# Patient Record
Sex: Female | Born: 1945
Health system: Southern US, Community
[De-identification: ages and names within clinical notes are randomized; demographics above are authoritative.]

## PROBLEM LIST (undated history)

## (undated) DIAGNOSIS — Z8719 Personal history of other diseases of the digestive system: Secondary | ICD-10-CM

## (undated) DIAGNOSIS — D649 Anemia, unspecified: Secondary | ICD-10-CM

## (undated) DIAGNOSIS — E7800 Pure hypercholesterolemia, unspecified: Secondary | ICD-10-CM

## (undated) DIAGNOSIS — I509 Heart failure, unspecified: Secondary | ICD-10-CM

## (undated) DIAGNOSIS — K219 Gastro-esophageal reflux disease without esophagitis: Secondary | ICD-10-CM

## (undated) DIAGNOSIS — J40 Bronchitis, not specified as acute or chronic: Secondary | ICD-10-CM

## (undated) DIAGNOSIS — I10 Essential (primary) hypertension: Secondary | ICD-10-CM

## (undated) DIAGNOSIS — F419 Anxiety disorder, unspecified: Secondary | ICD-10-CM

## (undated) DIAGNOSIS — E669 Obesity, unspecified: Secondary | ICD-10-CM

## (undated) DIAGNOSIS — N189 Chronic kidney disease, unspecified: Secondary | ICD-10-CM

## (undated) DIAGNOSIS — F329 Major depressive disorder, single episode, unspecified: Secondary | ICD-10-CM

## (undated) DIAGNOSIS — M1990 Unspecified osteoarthritis, unspecified site: Secondary | ICD-10-CM

## (undated) DIAGNOSIS — F32A Depression, unspecified: Secondary | ICD-10-CM

## (undated) DIAGNOSIS — E039 Hypothyroidism, unspecified: Secondary | ICD-10-CM

## (undated) HISTORY — DX: Obesity, unspecified: E66.9

## (undated) HISTORY — PX: TONSILLECTOMY: SUR1361

## (undated) HISTORY — PX: OTHER SURGICAL HISTORY: SHX169

## (undated) HISTORY — PX: APPENDECTOMY: SHX54

## (undated) HISTORY — PX: REDUCTION MAMMAPLASTY: SUR839

---

## 1979-05-31 HISTORY — PX: ABDOMINAL HYSTERECTOMY: SHX81

## 1997-12-04 ENCOUNTER — Encounter: Admission: RE | Admit: 1997-12-04 | Discharge: 1997-12-04 | Payer: Self-pay | Admitting: Internal Medicine

## 1998-06-25 ENCOUNTER — Encounter: Admission: RE | Admit: 1998-06-25 | Discharge: 1998-06-25 | Payer: Self-pay | Admitting: Hematology and Oncology

## 1998-07-29 ENCOUNTER — Encounter: Admission: RE | Admit: 1998-07-29 | Discharge: 1998-07-29 | Payer: Self-pay | Admitting: Hematology and Oncology

## 1998-08-10 ENCOUNTER — Encounter: Admission: RE | Admit: 1998-08-10 | Discharge: 1998-11-08 | Payer: Self-pay

## 1999-12-08 ENCOUNTER — Encounter: Payer: Self-pay | Admitting: Family Medicine

## 1999-12-08 ENCOUNTER — Encounter: Admission: RE | Admit: 1999-12-08 | Discharge: 1999-12-08 | Payer: Self-pay | Admitting: Family Medicine

## 2000-02-03 ENCOUNTER — Other Ambulatory Visit: Admission: RE | Admit: 2000-02-03 | Discharge: 2000-02-03 | Payer: Self-pay | Admitting: Family Medicine

## 2001-05-07 ENCOUNTER — Encounter: Payer: Self-pay | Admitting: Family Medicine

## 2001-05-07 ENCOUNTER — Encounter: Admission: RE | Admit: 2001-05-07 | Discharge: 2001-05-07 | Payer: Self-pay | Admitting: Family Medicine

## 2001-08-17 ENCOUNTER — Encounter: Admission: RE | Admit: 2001-08-17 | Discharge: 2001-08-17 | Payer: Self-pay | Admitting: Family Medicine

## 2001-08-17 ENCOUNTER — Encounter: Payer: Self-pay | Admitting: Family Medicine

## 2002-09-16 ENCOUNTER — Encounter: Payer: Self-pay | Admitting: Family Medicine

## 2002-09-16 ENCOUNTER — Encounter: Admission: RE | Admit: 2002-09-16 | Discharge: 2002-09-16 | Payer: Self-pay | Admitting: Family Medicine

## 2003-09-22 ENCOUNTER — Encounter: Admission: RE | Admit: 2003-09-22 | Discharge: 2003-09-22 | Payer: Self-pay | Admitting: Family Medicine

## 2004-09-23 ENCOUNTER — Encounter: Admission: RE | Admit: 2004-09-23 | Discharge: 2004-09-23 | Payer: Self-pay | Admitting: Family Medicine

## 2005-09-26 ENCOUNTER — Encounter: Admission: RE | Admit: 2005-09-26 | Discharge: 2005-09-26 | Payer: Self-pay | Admitting: Family Medicine

## 2005-12-13 ENCOUNTER — Other Ambulatory Visit: Admission: RE | Admit: 2005-12-13 | Discharge: 2005-12-13 | Payer: Self-pay | Admitting: Obstetrics and Gynecology

## 2006-09-30 ENCOUNTER — Emergency Department (HOSPITAL_COMMUNITY): Admission: EM | Admit: 2006-09-30 | Discharge: 2006-09-30 | Payer: Self-pay | Admitting: Emergency Medicine

## 2006-11-09 ENCOUNTER — Encounter: Admission: RE | Admit: 2006-11-09 | Discharge: 2006-11-09 | Payer: Self-pay | Admitting: Family Medicine

## 2007-01-09 ENCOUNTER — Emergency Department (HOSPITAL_COMMUNITY): Admission: EM | Admit: 2007-01-09 | Discharge: 2007-01-10 | Payer: Self-pay | Admitting: Emergency Medicine

## 2007-01-10 ENCOUNTER — Ambulatory Visit: Payer: Self-pay | Admitting: Psychiatry

## 2007-01-10 ENCOUNTER — Inpatient Hospital Stay (HOSPITAL_COMMUNITY): Admission: AD | Admit: 2007-01-10 | Discharge: 2007-01-18 | Payer: Self-pay | Admitting: Psychiatry

## 2007-04-06 ENCOUNTER — Encounter: Admission: RE | Admit: 2007-04-06 | Discharge: 2007-04-06 | Payer: Self-pay | Admitting: Family Medicine

## 2007-04-17 ENCOUNTER — Encounter: Admission: RE | Admit: 2007-04-17 | Discharge: 2007-04-17 | Payer: Self-pay | Admitting: Family Medicine

## 2007-04-28 ENCOUNTER — Encounter: Admission: RE | Admit: 2007-04-28 | Discharge: 2007-04-28 | Payer: Self-pay | Admitting: Family Medicine

## 2007-05-11 ENCOUNTER — Ambulatory Visit (HOSPITAL_COMMUNITY): Admission: RE | Admit: 2007-05-11 | Discharge: 2007-05-11 | Payer: Self-pay | Admitting: Family Medicine

## 2007-08-03 ENCOUNTER — Encounter: Admission: RE | Admit: 2007-08-03 | Discharge: 2007-08-03 | Payer: Self-pay | Admitting: Family Medicine

## 2007-08-09 ENCOUNTER — Other Ambulatory Visit: Admission: RE | Admit: 2007-08-09 | Discharge: 2007-08-09 | Payer: Self-pay | Admitting: Interventional Radiology

## 2007-08-09 ENCOUNTER — Encounter (INDEPENDENT_AMBULATORY_CARE_PROVIDER_SITE_OTHER): Payer: Self-pay | Admitting: Interventional Radiology

## 2007-08-09 ENCOUNTER — Encounter: Admission: RE | Admit: 2007-08-09 | Discharge: 2007-08-09 | Payer: Self-pay | Admitting: Family Medicine

## 2007-12-05 ENCOUNTER — Encounter: Admission: RE | Admit: 2007-12-05 | Discharge: 2007-12-05 | Payer: Self-pay | Admitting: Family Medicine

## 2008-01-10 ENCOUNTER — Encounter: Admission: RE | Admit: 2008-01-10 | Discharge: 2008-01-10 | Payer: Self-pay | Admitting: Surgery

## 2008-01-17 ENCOUNTER — Encounter: Admission: RE | Admit: 2008-01-17 | Discharge: 2008-01-17 | Payer: Self-pay | Admitting: Surgery

## 2008-02-22 ENCOUNTER — Encounter (INDEPENDENT_AMBULATORY_CARE_PROVIDER_SITE_OTHER): Payer: Self-pay | Admitting: Surgery

## 2008-02-22 ENCOUNTER — Ambulatory Visit (HOSPITAL_COMMUNITY): Admission: RE | Admit: 2008-02-22 | Discharge: 2008-02-23 | Payer: Self-pay | Admitting: Surgery

## 2008-03-14 ENCOUNTER — Emergency Department (HOSPITAL_COMMUNITY): Admission: EM | Admit: 2008-03-14 | Discharge: 2008-03-14 | Payer: Self-pay | Admitting: Emergency Medicine

## 2008-08-08 ENCOUNTER — Encounter: Admission: RE | Admit: 2008-08-08 | Discharge: 2008-08-08 | Payer: Self-pay | Admitting: Family Medicine

## 2010-03-18 ENCOUNTER — Encounter: Admission: RE | Admit: 2010-03-18 | Discharge: 2010-03-18 | Payer: Self-pay | Admitting: Family Medicine

## 2010-04-20 ENCOUNTER — Encounter: Admission: RE | Admit: 2010-04-20 | Discharge: 2010-04-20 | Payer: Self-pay | Admitting: Family Medicine

## 2010-05-20 ENCOUNTER — Encounter
Admission: RE | Admit: 2010-05-20 | Discharge: 2010-05-20 | Payer: Self-pay | Source: Home / Self Care | Attending: Family Medicine | Admitting: Family Medicine

## 2010-06-20 ENCOUNTER — Encounter: Payer: Self-pay | Admitting: Family Medicine

## 2010-06-21 ENCOUNTER — Encounter: Payer: Self-pay | Admitting: Surgery
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
## 2010-10-12 NOTE — Op Note (Signed)
NAME:  Amanda Cain, Amanda Cain              ACCOUNT NO.:  [id number]   MEDICAL RECORD NO.:  [id number]          PATIENT TYPE:  AMB   LOCATION:  DAY                          FACILITY:  [facility]   PHYSICIAN:  Adin Hector, MD     DATE OF BIRTH:  1945-08-18   DATE OF PROCEDURE:  02/22/2008  DATE OF DISCHARGE:                               OPERATIVE REPORT   PRIMARY CARE PHYSICIAN:  Osvaldo Human, M.D.   SURGEON:  Michael Boston.   ASSISTANT:  Imogene Burn. Tsuei, M.D.   PREOPERATIVE DIAGNOSES:  1. Supraclavicular large neck mass, probable lipoma.  2. Right thyroid nodules x2.  3. Small left thyroid nodules.   POSTOPERATIVE DIAGNOSIS:  1. Supraclavicular large neck mass, probable lipoma.  2. Right thyroid nodules x2.  3. Small left thyroid nodules.   PROCEDURE PERFORMED:  1. Excision of supraclavicular mass with closure over a 10-French      Jackson-Pratt drain.  2. Right thyroid lobectomy and his isthmusectomy.  3. Left neck exploration.   DRAINS:  A 10-French Jackson-Pratt drain rests in the supraclavicular  fossa where the large lipoma was with the tip actually resting in the  right pretracheal region where the former right thyroid lobe was.   ESTIMATED BLOOD LOSS:  50 mL.   COMPLICATIONS:  None apparent.   INDICATIONS:  [name] is a pleasant 65 year old morbidly obese female  who has had an enlarging mass above her right clavicle that has become  uncomfortable for her.  She has also had some thyroid nodules and is  followed by Dr. Roderick Pee.  He requested surgical consultation for both  these issues.  CT scan ultrasound confirmed a probable lipoma in the  neck.  The thyroid nodules have increased in size based on a follow-up  ultrasound and there was a prior biopsy that showed follicular lesion.  Adenoma or carcinoma could not be completely ruled out.   Given the fact that the lesion on the thyroid had enlarged and the neck  mass enlarged and become symptomatic, the decision  was made and  recommended for both.  She does have thyroid nodules in the left lobe.  They seem to be less than a centimeter in size but did note there is a  possibility that if those were suspicious on examination, then she may  require total thyroidectomy.   Technique and procedures were discussed.  Options discussed and  recommendation was made for excision as well.  The risks, benefits and  alternatives were discussed.  Questions were answered.  She agreed to  proceed.   OPERATIVE FINDINGS:  She had a very large lipomatous mass, probably 12 x  12 x 10 cm in size occupying most of the supraclavicular space and  actually adherent to her trapezius muscle posteriorly as well.  It  seemed very fatty, consistent with a lipoma with a pretty good capsule.   The right inferior lobe had a very large mass that seemed for the most  part soft but multilobulated.  There was another firmer nodule.  There  was no evidence of any significant invasion.  Frozen specimen  is  consistent more likely with an adenomatous nodule versus a probable  hyperplastic tissue but suspicion for carcinoma is low.  The isthmus  appeared normal.  The left thyroid lobe was small with no significantly  suspicious masses.   DESCRIPTION OF PROCEDURE:  Informed consent was confirmed.  The patient  underwent general anesthesia without any difficulty.  She voided just  prior to coming to the operating room.  She was placed in the supine  position with both arms tucked with her upper back supported and her  neck gently extended.  Her neck and chest was prepped and draped in a  sterile fashion.   Attention was turned toward the thyroid region to the thyroid lesion,  that was the larger more suspicious lesion.  A curvilinear incision was  made in the neck fold about a fingerbreadth above the clavicles for  about 6 cm and extended to around 7.5 cm given her thick neck.  Cautery  was used to get through the subcutaneous  tissues and platysmas muscle,  with hemostasis excellent.  Subplatysmal flaps were created superiorly  and laterally.  This was freed off the sternocleidomastoids bilaterally  as well for better mobility.  The strap muscles were parted in the  midline and we were able to get behind the sternothyroid, sternohyoid  muscles to get to the thyroid lobe.  Palpation revealed a large nodule  in the right lobe.  The left seemed small with no significant masses.   I went ahead and decided to proceed with thyroid lobectomy.  The thyroid  gland was freed from its attachments to the strap muscles using primary  controlled blunt dissection as well as focused cautery anteriorly and  then transitioning over to the Harmonic scalpel more posteriorly.  Attention was turned to the right superior thyroid lobe.  It was  skeletonized and came up to the lobe rather easily.  The superior lobe  vessels were isolated on their arterial & venous structures and these  were individually clipped and transected using ultrasonic dissection.  We stayed adherent to the superior lobe and were able to free that off.  The medial attachments towards the thyroid and cricocartilage was  carefully freed off using primary blunt dissection.  With that I could  see this was a good candidate for the superior parathyroid gland and it  was preserved and carefully peeled off the thyroid gland with its blood  supply intact.   Attention was turned towards the right inferior lobe.  I carefully  dissected to free that off.  I could see good candidate for the right  inferior parathyroid gland, which seemed more flattened and in a pancake  style and given the mass, I think it pressed it flat.  There was a  little bit of bruising to the gland but it seemed very viable.  I was  able to peel that off.  Further dissection done until I came over to the  middle thyroid region and the vein could be easily isolated and clipped  and transected using  ultrasonic dissection.   Careful meticulous dissection was done to help elevate and roll the  thyroid gland out of the tracheoesophageal groove and free it up until I  was able to come up to the ligament of Berry.  Careful meticulous  dissection was done using primarily ultrasonic dissection to free off  the thyroid gland  to its attachment onto the trachea.  The laryngeal  nerve on the right side could be  seen and it was preserved and kept  away.  No cautery was used in the region.  With further mobilization, I  was able to get behind the isthmus.  The isthmus was very thin but I was  able to free it over towards the left side of the lobe, which was much  smaller, and transected it using ultrasonic dissection.   The specimen was sent off for frozen section.  Dr. Claudette Laws  evaluated it and felt his suspicion was low for any significant  carcinoma.  I palpated the left thyroid gland and it was small but  smooth, with no significant nodules on that side.  I decided to leave  that in place.  The wound was packed with gauze.  Hemostasis was  assured.  A small oozer near the ligament of Gwenlyn Found was elevated and  clipped.  Further packing was done and Surgicel was packed into the  wound.   Attention was turned towards the supraclavicular mass.  A curvilinear 5-  cm incision was made in the supraclavicular fossa following the natural  skin lines, extending it to about 6 cm.  I got through the subcutaneous  tissues and immediately encountered a large fatty mass.  Careful blunt  and focused cautery dissection was used to get circumferentially around  the mass.  It came away down deeply and I was able to reflect it from  the inferior and it did not have any inferior attachments and actually  seemed to have more attachments more towards the medial neck and  posteriorly.  We freed it out about 80 or 90% of it intact but there  were some more dense adhesions on the posterior neck near the  trapezius;  therefore, we reduced the larger mass and sent that off and focused on  the smaller mass posteriorly.  The fat globules was carefully clamped  with an Allis clamp and meticulous dissection was done to free it off  the muscle bundles that it was densely adherent to posteriorly.  Ultrasonic dissection was used on the mild vascular bundle that seemed  to be going to the fatty mass.  This was sent off.  Some of the sac  which the lipoma was in had torn, so I went back and freed that off as  well.   Copious irrigation was done.  Hemostasis was good in the fossa.  I went  up and put some Surgicel at the base near where some of the more  significant feeder vessels were.  I ended up placing a drain as noted  above through a lateral supraclavicular stab wound and tunneled the JP  into the supraclavicular wound and with the tip down in the right  tracheoesophageal groove after tunneling underneath the  sternocleidomastoid sternohyoid muscles under direct visualization and  palpation.   Attention was turned back towards the right neck wound.  Hemostasis was  good.  I went up and closed  the wound by reapproximating the strap  muscles in the midline using interrupted 3-0 Vicryl stitch.  The  platysma flaps were brought back together transversely using 3-0 Vicryl  interrupted stitches as well.  The skin was closed using 4-0 Monocryl  stitch.   The right supraclavicular wound was reinspected and hemostasis was  excellent.  The wound was closed using 3-0 Vicryl deep dermal stitches  and right 4-0 subcuticular Monocryl stitches.  Full and partial length  Steri-Strips were placed.  The drain was secured to the skin using 2-0  silk stitch and tightened down with a 4-0 Monocryl stitch as well.  A  sterile dressing was applied.   The patient was extubated and taken to the recovery room in stable  condition.   The surgery was performed in [facility] operating room 11.      Adin Hector, MD  Electronically Signed     SCG/MEDQ  D:  02/22/2008  T:  02/23/2008  Job:  [id number]   cc:   Osvaldo Human, M.D.  Fax: [phone]
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
## 2011-02-28 LAB — HEMOGLOBIN AND HEMATOCRIT, BLOOD
HCT: 35.6 — ABNORMAL LOW
Hemoglobin: 11.7 — ABNORMAL LOW

## 2011-02-28 LAB — BASIC METABOLIC PANEL
BUN: 15
CO2: 30
Calcium: 9.7
Chloride: 100
Creatinine, Ser: 0.85
GFR calc Af Amer: >60
GFR calc non Af Amer: >60
Glucose, Bld: 110 — ABNORMAL HIGH
Potassium: 3.5
Sodium: 137

## 2011-02-28 LAB — GLUCOSE, CAPILLARY
Glucose-Capillary: 114 — ABNORMAL HIGH
Glucose-Capillary: 169 — ABNORMAL HIGH
Glucose-Capillary: 207 — ABNORMAL HIGH
Glucose-Capillary: 135 — ABNORMAL HIGH

## 2011-03-11 LAB — HEPATIC FUNCTION PANEL
ALT: 27
AST: 16
Albumin: 3.3 — ABNORMAL LOW
Alkaline Phosphatase: 68
Bilirubin, Direct: 0.1
Indirect Bilirubin: 0.6
Total Bilirubin: 0.7
Total Protein: 6.6

## 2011-03-11 LAB — TSH: TSH: 0.799

## 2011-03-11 LAB — MAGNESIUM: Magnesium: 2.2

## 2011-03-14 LAB — RAPID URINE DRUG SCREEN, HOSP PERFORMED
Amphetamines: NOT DETECTED
Barbiturates: NOT DETECTED
Benzodiazepines: POSITIVE — AB
Cocaine: NOT DETECTED
Opiates: NOT DETECTED
Tetrahydrocannabinol: NOT DETECTED

## 2011-03-14 LAB — BASIC METABOLIC PANEL
BUN: 21
CO2: 26
Calcium: 9.3
Chloride: 100
Creatinine, Ser: 1.16
GFR calc Af Amer: 57 — ABNORMAL LOW
GFR calc non Af Amer: 47 — ABNORMAL LOW
Glucose, Bld: 140 — ABNORMAL HIGH
Potassium: 3.8
Sodium: 135

## 2011-03-14 LAB — CBC
HCT: 35.5 — ABNORMAL LOW
Hemoglobin: 11.8 — ABNORMAL LOW
MCHC: 33.3
MCV: 81
Platelets: 378
RBC: 4.38
RDW: 18.5 — ABNORMAL HIGH
WBC: 7.8

## 2011-03-14 LAB — URINALYSIS, ROUTINE W REFLEX MICROSCOPIC
Bilirubin Urine: NEGATIVE
Glucose, UA: NEGATIVE
Hgb urine dipstick: NEGATIVE
Ketones, ur: NEGATIVE
Nitrite: NEGATIVE
Protein, ur: NEGATIVE
Specific Gravity, Urine: 1.014
Urobilinogen, UA: 0.2
pH: 6.5

## 2011-03-14 LAB — DIFFERENTIAL
Basophils Absolute: 0
Basophils Relative: 0
Eosinophils Absolute: 0.2
Eosinophils Relative: 3
Lymphocytes Relative: 24
Lymphs Abs: 1.8
Monocytes Absolute: 0.6
Monocytes Relative: 8
Neutro Abs: 5.1
Neutrophils Relative %: 66

## 2011-03-14 LAB — URINE MICROSCOPIC-ADD ON

## 2011-03-14 LAB — ETHANOL: Alcohol, Ethyl (B): <5

## 2011-05-13 ENCOUNTER — Other Ambulatory Visit: Payer: Self-pay | Admitting: Family Medicine

## 2011-05-13 DIAGNOSIS — Z1231 Encounter for screening mammogram for malignant neoplasm of breast: Secondary | ICD-10-CM

## 2011-06-17 ENCOUNTER — Ambulatory Visit: Payer: Self-pay

## 2011-06-30 ENCOUNTER — Encounter (HOSPITAL_COMMUNITY): Payer: Self-pay | Admitting: Pharmacy Technician

## 2011-07-01 ENCOUNTER — Other Ambulatory Visit: Payer: Self-pay | Admitting: Orthopedic Surgery

## 2011-07-12 ENCOUNTER — Encounter (HOSPITAL_COMMUNITY): Payer: Self-pay

## 2011-07-12 ENCOUNTER — Encounter (HOSPITAL_COMMUNITY)
Admission: RE | Admit: 2011-07-12 | Discharge: 2011-07-12 | Disposition: A | Discharge status: Home / Self Care | Payer: Medicare Other | Source: Ambulatory Visit | Attending: Orthopedic Surgery | Admitting: Orthopedic Surgery

## 2011-07-12 HISTORY — DX: Major depressive disorder, single episode, unspecified: F32.9

## 2011-07-12 HISTORY — DX: Anemia, unspecified: D64.9

## 2011-07-12 HISTORY — DX: Hypothyroidism, unspecified: E03.9

## 2011-07-12 HISTORY — DX: Anxiety disorder, unspecified: F41.9

## 2011-07-12 HISTORY — DX: Depression, unspecified: F32.A

## 2011-07-12 HISTORY — DX: Gastro-esophageal reflux disease without esophagitis: K21.9

## 2011-07-12 LAB — CBC
HCT: 32.9 % — ABNORMAL LOW (ref 36.0–46.0)
Hemoglobin: 10.4 g/dL — ABNORMAL LOW (ref 12.0–15.0)
MCH: 26 pg (ref 26.0–34.0)
MCHC: 31.6 g/dL (ref 30.0–36.0)
MCV: 82.3 fL (ref 78.0–100.0)
Platelets: 400 10*3/uL (ref 150–400)
RBC: 4 MIL/uL (ref 3.87–5.11)
RDW: 17.2 % — ABNORMAL HIGH (ref 11.5–15.5)
WBC: 9.9 10*3/uL (ref 4.0–10.5)

## 2011-07-12 LAB — URINALYSIS, ROUTINE W REFLEX MICROSCOPIC
Bilirubin Urine: NEGATIVE
Glucose, UA: NEGATIVE mg/dL
Hgb urine dipstick: NEGATIVE
Ketones, ur: NEGATIVE mg/dL
Leukocytes, UA: NEGATIVE
Nitrite: NEGATIVE
Protein, ur: NEGATIVE mg/dL
Specific Gravity, Urine: 1.006 (ref 1.005–1.030)
Urobilinogen, UA: 0.2 mg/dL (ref 0.0–1.0)
pH: 7.5 (ref 5.0–8.0)

## 2011-07-12 LAB — COMPREHENSIVE METABOLIC PANEL
ALT: 15 U/L (ref 0–35)
AST: 13 U/L (ref 0–37)
Albumin: 3.5 g/dL (ref 3.5–5.2)
Alkaline Phosphatase: 65 U/L (ref 39–117)
BUN: 12 mg/dL (ref 6–23)
CO2: 28 mEq/L (ref 19–32)
Calcium: 9.5 mg/dL (ref 8.4–10.5)
Chloride: 99 mEq/L (ref 96–112)
Creatinine, Ser: 0.93 mg/dL (ref 0.50–1.10)
GFR calc Af Amer: 72 mL/min — ABNORMAL LOW (ref >90)
GFR calc non Af Amer: 62 mL/min — ABNORMAL LOW (ref >90)
Glucose, Bld: 109 mg/dL — ABNORMAL HIGH (ref 70–99)
Potassium: 3.7 mEq/L (ref 3.5–5.1)
Sodium: 136 mEq/L (ref 135–145)
Total Bilirubin: 0.3 mg/dL (ref 0.3–1.2)
Total Protein: 7.5 g/dL (ref 6.0–8.3)

## 2011-07-12 LAB — SURGICAL PCR SCREEN
MRSA, PCR: NEGATIVE
Staphylococcus aureus: NEGATIVE

## 2011-07-12 LAB — DIFFERENTIAL
Basophils Absolute: 0 10*3/uL (ref 0.0–0.1)
Basophils Relative: 0 % (ref 0–1)
Eosinophils Absolute: 0.1 10*3/uL (ref 0.0–0.7)
Eosinophils Relative: 1 % (ref 0–5)
Lymphocytes Relative: 10 % — ABNORMAL LOW (ref 12–46)
Lymphs Abs: 1 10*3/uL (ref 0.7–4.0)
Monocytes Absolute: 0.7 10*3/uL (ref 0.1–1.0)
Monocytes Relative: 7 % (ref 3–12)
Neutro Abs: 8 10*3/uL — ABNORMAL HIGH (ref 1.7–7.7)
Neutrophils Relative %: 81 % — ABNORMAL HIGH (ref 43–77)

## 2011-07-12 LAB — PROTIME-INR
INR: 1.02 (ref 0.00–1.49)
Prothrombin Time: 13.6 seconds (ref 11.6–15.2)

## 2011-07-12 LAB — APTT: aPTT: 35 seconds (ref 24–37)

## 2011-07-12 NOTE — Pre-Procedure Instructions (Signed)
Chest xray 03-17-2011 [facility] on chart e[facility] 04-29-11 [facility] on chart

## 2011-07-12 NOTE — Patient Instructions (Addendum)
20 ADELEI MALTBIE  07/12/2011   Your procedure is scheduled on:   Report to [facility] at  [place] AM.  Call this number if you have problems the morning of surgery: [phone]   Remember:   Do not eat food or drink liquids:After Midnight.  Take these medicines the morning of surgery with A SIP OF WATER: levothryoid, ativan, toprol, plendil   Do not wear jewelry...  Do not wear lotions, powders, or perfumes. Do not wear deodorant.  .  Do not bring valuables to the hospital.  Contacts, dentures or bridgework may not be worn into surgery.  Leave suitcase in the car. After surgery it may be brought to your room.  For patients admitted to the hospital, checkout time is 11:00 AM the day of discharge.     Special Instructions: CHG Shower Use Special Wash: 1/2 bottle night before surgery and 1/2 bottle morning of surgery.neck down avoid private area, do not shave for 2 days before showers   Please read over the following fact sheets that you were given: MRSA Information  Ivin Booty Quadir Muns rn [facility] pre op nurse phone number [phone] call if needed

## 2011-07-15 ENCOUNTER — Encounter (HOSPITAL_COMMUNITY)
Admission: RE | Disposition: A | Discharge status: Skilled Nursing Facility | Payer: Self-pay | Source: Ambulatory Visit | Attending: Orthopedic Surgery

## 2011-07-15 ENCOUNTER — Encounter (HOSPITAL_COMMUNITY): Payer: Self-pay | Admitting: *Deleted

## 2011-07-15 ENCOUNTER — Inpatient Hospital Stay (HOSPITAL_COMMUNITY): Payer: Medicare Other | Admitting: *Deleted

## 2011-07-15 ENCOUNTER — Inpatient Hospital Stay (HOSPITAL_COMMUNITY)
Admission: RE | Admit: 2011-07-15 | Discharge: 2011-07-19 | DRG: 470 | Disposition: A | Discharge status: Skilled Nursing Facility | Payer: Medicare Other | Source: Ambulatory Visit | Attending: Orthopedic Surgery | Admitting: Orthopedic Surgery

## 2011-07-15 DIAGNOSIS — D649 Anemia, unspecified: Secondary | ICD-10-CM | POA: Diagnosis present

## 2011-07-15 DIAGNOSIS — Z01812 Encounter for preprocedural laboratory examination: Secondary | ICD-10-CM

## 2011-07-15 DIAGNOSIS — E119 Type 2 diabetes mellitus without complications: Secondary | ICD-10-CM | POA: Diagnosis present

## 2011-07-15 DIAGNOSIS — M1712 Unilateral primary osteoarthritis, left knee: Secondary | ICD-10-CM | POA: Diagnosis present

## 2011-07-15 DIAGNOSIS — E039 Hypothyroidism, unspecified: Secondary | ICD-10-CM | POA: Diagnosis present

## 2011-07-15 DIAGNOSIS — M1710 Unilateral primary osteoarthritis, unspecified knee: Principal | ICD-10-CM | POA: Diagnosis present

## 2011-07-15 DIAGNOSIS — E669 Obesity, unspecified: Secondary | ICD-10-CM | POA: Diagnosis present

## 2011-07-15 HISTORY — PX: KNEE ARTHROPLASTY: SHX992

## 2011-07-15 LAB — GLUCOSE, CAPILLARY
Glucose-Capillary: 142 mg/dL — ABNORMAL HIGH (ref 70–99)
Glucose-Capillary: 176 mg/dL — ABNORMAL HIGH (ref 70–99)
Glucose-Capillary: 163 mg/dL — ABNORMAL HIGH (ref 70–99)
Glucose-Capillary: 160 mg/dL — ABNORMAL HIGH (ref 70–99)
Glucose-Capillary: 117 mg/dL — ABNORMAL HIGH (ref 70–99)

## 2011-07-15 SURGERY — ARTHROPLASTY, KNEE, TOTAL, USING IMAGELESS COMPUTER-ASSISTED NAVIGATION
Anesthesia: General | Site: Knee | Laterality: Left | Wound class: Clean

## 2011-07-15 MED ORDER — VITAMINS A & D EX OINT
TOPICAL_OINTMENT | CUTANEOUS | Status: AC
  Administered 2011-07-15: 15:00:00
  Filled 2011-07-15: qty 5

## 2011-07-15 MED ORDER — POVIDONE-IODINE 7.5 % EX SOLN: Freq: Once | CUTANEOUS | Status: DC

## 2011-07-15 MED ORDER — DOCUSATE SODIUM 100 MG PO CAPS
100.0000 mg | ORAL_CAPSULE | Freq: Two times a day (BID) | ORAL | Status: DC
  Administered 2011-07-15 – 2011-07-19 (×8): 100 mg via ORAL
  Filled 2011-07-15 (×9): qty 1

## 2011-07-15 MED ORDER — ACETAMINOPHEN 10 MG/ML IV SOLN
1000.0000 mg | Freq: Four times a day (QID) | INTRAVENOUS | Status: AC
  Administered 2011-07-15 – 2011-07-16 (×3): 1000 mg via INTRAVENOUS
  Filled 2011-07-15 (×3): qty 100

## 2011-07-15 MED ORDER — SODIUM CHLORIDE 0.9 % IJ SOLN: 9.0000 mL | INTRAMUSCULAR | Status: DC | PRN

## 2011-07-15 MED ORDER — FELODIPINE ER 5 MG PO TB24
5.0000 mg | ORAL_TABLET | Freq: Every morning | ORAL | Status: DC
  Administered 2011-07-16 – 2011-07-19 (×4): 5 mg via ORAL
  Filled 2011-07-15 (×4): qty 1

## 2011-07-15 MED ORDER — CISATRACURIUM BESYLATE 2 MG/ML IV SOLN
INTRAVENOUS | Status: DC | PRN
  Administered 2011-07-15: 2 mg via INTRAVENOUS
  Administered 2011-07-15: 8 mg via INTRAVENOUS

## 2011-07-15 MED ORDER — ESCITALOPRAM OXALATE 20 MG PO TABS
20.0000 mg | ORAL_TABLET | Freq: Every day | ORAL | Status: DC
  Administered 2011-07-15 – 2011-07-18 (×4): 20 mg via ORAL
  Filled 2011-07-15 (×5): qty 1

## 2011-07-15 MED ORDER — DIPHENHYDRAMINE HCL 50 MG/ML IJ SOLN: 12.5000 mg | Freq: Four times a day (QID) | INTRAMUSCULAR | Status: DC | PRN

## 2011-07-15 MED ORDER — BISACODYL 5 MG PO TBEC
5.0000 mg | DELAYED_RELEASE_TABLET | Freq: Every day | ORAL | Status: DC | PRN
  Administered 2011-07-17: 5 mg via ORAL
  Filled 2011-07-15: qty 1

## 2011-07-15 MED ORDER — DROPERIDOL 2.5 MG/ML IJ SOLN
INTRAMUSCULAR | Status: DC | PRN
  Administered 2011-07-15: 0.625 mg via INTRAVENOUS

## 2011-07-15 MED ORDER — CEFAZOLIN SODIUM-DEXTROSE 2-3 GM-% IV SOLR: 2.0000 g | INTRAVENOUS | Status: DC

## 2011-07-15 MED ORDER — ACETAMINOPHEN 10 MG/ML IV SOLN
INTRAVENOUS | Status: DC | PRN
  Administered 2011-07-15: 1000 mg via INTRAVENOUS

## 2011-07-15 MED ORDER — LEVOTHYROXINE SODIUM 100 MCG PO TABS
100.0000 ug | ORAL_TABLET | Freq: Every day | ORAL | Status: DC
  Administered 2011-07-16 – 2011-07-19 (×4): 100 ug via ORAL
  Filled 2011-07-15 (×4): qty 1

## 2011-07-15 MED ORDER — OLMESARTAN MEDOXOMIL-HCTZ 40-25 MG PO TABS: 1.0000 | ORAL_TABLET | Freq: Every day | ORAL | Status: DC

## 2011-07-15 MED ORDER — INSULIN ASPART 100 UNIT/ML ~~LOC~~ SOLN
0.0000 [IU] | Freq: Three times a day (TID) | SUBCUTANEOUS | Status: DC
  Administered 2011-07-15: 3 [IU] via SUBCUTANEOUS
  Administered 2011-07-16: 2 [IU] via SUBCUTANEOUS
  Administered 2011-07-16: 3 [IU] via SUBCUTANEOUS
  Administered 2011-07-16: 2 [IU] via SUBCUTANEOUS
  Administered 2011-07-17 (×2): 3 [IU] via SUBCUTANEOUS
  Administered 2011-07-17 – 2011-07-18 (×2): 2 [IU] via SUBCUTANEOUS
  Administered 2011-07-18: 3 [IU] via SUBCUTANEOUS
  Administered 2011-07-18: 2 [IU] via SUBCUTANEOUS
  Administered 2011-07-19: 3 [IU] via SUBCUTANEOUS
  Filled 2011-07-15: qty 3

## 2011-07-15 MED ORDER — COUMADIN BOOK
Freq: Once | Status: AC
  Administered 2011-07-15: 14:00:00
  Filled 2011-07-15: qty 1

## 2011-07-15 MED ORDER — ACETAMINOPHEN 325 MG PO TABS: 650.0000 mg | ORAL_TABLET | Freq: Four times a day (QID) | ORAL | Status: DC | PRN

## 2011-07-15 MED ORDER — PANTOPRAZOLE SODIUM 40 MG PO TBEC
40.0000 mg | DELAYED_RELEASE_TABLET | Freq: Every day | ORAL | Status: DC
  Administered 2011-07-16 – 2011-07-18 (×3): 40 mg via ORAL
  Filled 2011-07-15 (×4): qty 1

## 2011-07-15 MED ORDER — NEOSTIGMINE METHYLSULFATE 1 MG/ML IJ SOLN
INTRAMUSCULAR | Status: DC | PRN
  Administered 2011-07-15: 1 mg via INTRAVENOUS

## 2011-07-15 MED ORDER — LACTATED RINGERS IV SOLN
INTRAVENOUS | Status: DC | PRN
  Administered 2011-07-15 (×3): via INTRAVENOUS

## 2011-07-15 MED ORDER — VITAMIN B-12 1000 MCG PO TABS
1000.0000 ug | ORAL_TABLET | Freq: Every day | ORAL | Status: DC
  Administered 2011-07-16 – 2011-07-19 (×4): 1000 ug via ORAL
  Filled 2011-07-15 (×4): qty 1

## 2011-07-15 MED ORDER — ONDANSETRON HCL 4 MG/2ML IJ SOLN: 4.0000 mg | Freq: Four times a day (QID) | INTRAMUSCULAR | Status: DC | PRN

## 2011-07-15 MED ORDER — KETOROLAC TROMETHAMINE 30 MG/ML IJ SOLN
15.0000 mg | Freq: Three times a day (TID) | INTRAMUSCULAR | Status: AC
  Administered 2011-07-15 (×3): 15 mg via INTRAVENOUS
  Filled 2011-07-15 (×2): qty 1

## 2011-07-15 MED ORDER — ONDANSETRON HCL 4 MG/2ML IJ SOLN
4.0000 mg | Freq: Four times a day (QID) | INTRAMUSCULAR | Status: DC | PRN
  Administered 2011-07-17: 4 mg via INTRAVENOUS
  Filled 2011-07-15 (×2): qty 2

## 2011-07-15 MED ORDER — ALUM & MAG HYDROXIDE-SIMETH 200-200-20 MG/5ML PO SUSP
30.0000 mL | ORAL | Status: DC | PRN
  Administered 2011-07-15 – 2011-07-16 (×2): 30 mL via ORAL
  Filled 2011-07-15 (×2): qty 30

## 2011-07-15 MED ORDER — PROMETHAZINE HCL 25 MG/ML IJ SOLN: 6.2500 mg | INTRAMUSCULAR | Status: DC | PRN

## 2011-07-15 MED ORDER — OLMESARTAN MEDOXOMIL 40 MG PO TABS
40.0000 mg | ORAL_TABLET | Freq: Every day | ORAL | Status: DC
  Administered 2011-07-16 – 2011-07-19 (×4): 40 mg via ORAL
  Filled 2011-07-15 (×4): qty 1

## 2011-07-15 MED ORDER — HYDROMORPHONE HCL PF 1 MG/ML IJ SOLN: 0.2500 mg | INTRAMUSCULAR | Status: DC | PRN

## 2011-07-15 MED ORDER — ONDANSETRON HCL 4 MG/2ML IJ SOLN
INTRAMUSCULAR | Status: DC | PRN
  Administered 2011-07-15 (×2): 2 mg via INTRAVENOUS

## 2011-07-15 MED ORDER — ACETAMINOPHEN 650 MG RE SUPP: 650.0000 mg | Freq: Four times a day (QID) | RECTAL | Status: DC | PRN

## 2011-07-15 MED ORDER — LABETALOL HCL 5 MG/ML IV SOLN
INTRAVENOUS | Status: DC | PRN
  Administered 2011-07-15: 2.5 mg via INTRAVENOUS

## 2011-07-15 MED ORDER — ROSUVASTATIN CALCIUM 20 MG PO TABS
20.0000 mg | ORAL_TABLET | Freq: Every day | ORAL | Status: DC
  Administered 2011-07-16 – 2011-07-19 (×4): 20 mg via ORAL
  Filled 2011-07-15 (×4): qty 1

## 2011-07-15 MED ORDER — METHOCARBAMOL 100 MG/ML IJ SOLN
500.0000 mg | Freq: Four times a day (QID) | INTRAVENOUS | Status: DC | PRN
  Administered 2011-07-15: 500 mg via INTRAVENOUS
  Filled 2011-07-15 (×2): qty 5

## 2011-07-15 MED ORDER — ESTROGENS CONJUGATED 0.45 MG PO TABS
0.4500 mg | ORAL_TABLET | Freq: Every day | ORAL | Status: DC
  Administered 2011-07-18 – 2011-07-19 (×2): 0.45 mg via ORAL
  Filled 2011-07-15 (×4): qty 1

## 2011-07-15 MED ORDER — MORPHINE SULFATE (PF) 1 MG/ML IV SOLN
INTRAVENOUS | Status: DC
  Administered 2011-07-15: 14:00:00 via INTRAVENOUS
  Administered 2011-07-15 – 2011-07-16 (×2): 4 mg via INTRAVENOUS
  Administered 2011-07-16: 4.5 mg via INTRAVENOUS
  Administered 2011-07-16 (×2): 2 mg via INTRAVENOUS
  Administered 2011-07-16: 23:00:00 via INTRAVENOUS
  Administered 2011-07-16: 2 mg via INTRAVENOUS
  Administered 2011-07-17: 1.5 mg via INTRAVENOUS
  Administered 2011-07-17: 3 mg via INTRAVENOUS
  Filled 2011-07-15 (×2): qty 25

## 2011-07-15 MED ORDER — SUCCINYLCHOLINE CHLORIDE 20 MG/ML IJ SOLN
INTRAMUSCULAR | Status: DC | PRN
  Administered 2011-07-15: 120 mg via INTRAVENOUS

## 2011-07-15 MED ORDER — WARFARIN SODIUM 7.5 MG PO TABS
7.5000 mg | ORAL_TABLET | Freq: Once | ORAL | Status: AC
  Administered 2011-07-15: 7.5 mg via ORAL
  Filled 2011-07-15: qty 1

## 2011-07-15 MED ORDER — LORAZEPAM 0.5 MG PO TABS
0.5000 mg | ORAL_TABLET | Freq: Two times a day (BID) | ORAL | Status: DC
  Administered 2011-07-15 – 2011-07-19 (×8): 0.5 mg via ORAL
  Filled 2011-07-15 (×8): qty 1

## 2011-07-15 MED ORDER — HYDROCHLOROTHIAZIDE 25 MG PO TABS
25.0000 mg | ORAL_TABLET | Freq: Every day | ORAL | Status: DC
  Administered 2011-07-16 – 2011-07-19 (×4): 25 mg via ORAL
  Filled 2011-07-15 (×4): qty 1

## 2011-07-15 MED ORDER — METOPROLOL SUCCINATE ER 100 MG PO TB24
200.0000 mg | ORAL_TABLET | Freq: Every day | ORAL | Status: DC
  Administered 2011-07-16 – 2011-07-19 (×4): 200 mg via ORAL
  Filled 2011-07-15 (×4): qty 2

## 2011-07-15 MED ORDER — PROPOFOL 10 MG/ML IV EMUL
INTRAVENOUS | Status: DC | PRN
  Administered 2011-07-15: 200 mg via INTRAVENOUS
  Administered 2011-07-15: 25 mg via INTRAVENOUS
  Administered 2011-07-15: 50 mg via INTRAVENOUS

## 2011-07-15 MED ORDER — FENTANYL CITRATE 0.05 MG/ML IJ SOLN
INTRAMUSCULAR | Status: DC | PRN
  Administered 2011-07-15: 100 ug via INTRAVENOUS
  Administered 2011-07-15 (×2): 50 ug via INTRAVENOUS
  Administered 2011-07-15: 100 ug via INTRAVENOUS
  Administered 2011-07-15 (×2): 50 ug via INTRAVENOUS

## 2011-07-15 MED ORDER — CEFAZOLIN SODIUM 1-5 GM-% IV SOLN
INTRAVENOUS | Status: DC | PRN
  Administered 2011-07-15: 2 g via INTRAVENOUS

## 2011-07-15 MED ORDER — ZOLPIDEM TARTRATE 5 MG PO TABS: 5.0000 mg | ORAL_TABLET | Freq: Every evening | ORAL | Status: DC | PRN

## 2011-07-15 MED ORDER — SODIUM CHLORIDE 0.9 % IR SOLN
Status: DC | PRN
  Administered 2011-07-15: 3000 mL

## 2011-07-15 MED ORDER — SODIUM CHLORIDE 0.9 % IV SOLN
INTRAVENOUS | Status: DC
  Administered 2011-07-15 – 2011-07-17 (×3): via INTRAVENOUS

## 2011-07-15 MED ORDER — METFORMIN HCL 500 MG PO TABS
1000.0000 mg | ORAL_TABLET | Freq: Two times a day (BID) | ORAL | Status: DC
  Administered 2011-07-15 – 2011-07-19 (×8): 1000 mg via ORAL
  Filled 2011-07-15 (×9): qty 2

## 2011-07-15 MED ORDER — MIDAZOLAM HCL 5 MG/5ML IJ SOLN
INTRAMUSCULAR | Status: DC | PRN
  Administered 2011-07-15 (×2): 1 mg via INTRAVENOUS

## 2011-07-15 MED ORDER — CEFAZOLIN SODIUM-DEXTROSE 2-3 GM-% IV SOLR
2.0000 g | Freq: Four times a day (QID) | INTRAVENOUS | Status: AC
  Administered 2011-07-15 – 2011-07-16 (×3): 2 g via INTRAVENOUS
  Filled 2011-07-15 (×3): qty 50

## 2011-07-15 MED ORDER — NALOXONE HCL 0.4 MG/ML IJ SOLN: 0.4000 mg | INTRAMUSCULAR | Status: DC | PRN

## 2011-07-15 MED ORDER — WARFARIN VIDEO: Freq: Once | Status: DC

## 2011-07-15 MED ORDER — LIDOCAINE HCL (CARDIAC) 20 MG/ML IV SOLN
INTRAVENOUS | Status: DC | PRN
  Administered 2011-07-15: 75 mg via INTRAVENOUS

## 2011-07-15 MED ORDER — ONDANSETRON HCL 4 MG PO TABS: 4.0000 mg | ORAL_TABLET | Freq: Four times a day (QID) | ORAL | Status: DC | PRN

## 2011-07-15 MED ORDER — PROMETHAZINE HCL 25 MG/ML IJ SOLN: 12.5000 mg | Freq: Four times a day (QID) | INTRAMUSCULAR | Status: DC | PRN

## 2011-07-15 MED ORDER — DIPHENHYDRAMINE HCL 12.5 MG/5ML PO ELIX: 12.5000 mg | ORAL_SOLUTION | Freq: Four times a day (QID) | ORAL | Status: DC | PRN

## 2011-07-15 MED ORDER — GLYCOPYRROLATE 0.2 MG/ML IJ SOLN
INTRAMUSCULAR | Status: DC | PRN
  Administered 2011-07-15: .2 mg via INTRAVENOUS
  Administered 2011-07-15: .4 mg via INTRAVENOUS

## 2011-07-15 MED ORDER — METHOCARBAMOL 500 MG PO TABS
500.0000 mg | ORAL_TABLET | Freq: Four times a day (QID) | ORAL | Status: DC | PRN
  Administered 2011-07-16 – 2011-07-19 (×7): 500 mg via ORAL
  Filled 2011-07-15 (×7): qty 1

## 2011-07-15 MED ORDER — INSULIN ASPART 100 UNIT/ML ~~LOC~~ SOLN
0.0000 [IU] | SUBCUTANEOUS | Status: DC
Start: 2011-07-15 — End: 2011-07-15

## 2011-07-15 MED ORDER — SODIUM CHLORIDE 0.9 % IR SOLN
Status: DC | PRN
  Administered 2011-07-15: 1000 mL

## 2011-07-15 MED ORDER — OXYCODONE HCL 5 MG PO TABS
5.0000 mg | ORAL_TABLET | ORAL | Status: DC | PRN
  Administered 2011-07-16 – 2011-07-19 (×10): 5 mg via ORAL
  Administered 2011-07-19: 10 mg via ORAL
  Filled 2011-07-15: qty 1
  Filled 2011-07-15: qty 2
  Filled 2011-07-15 (×7): qty 1
  Filled 2011-07-15: qty 2
  Filled 2011-07-15: qty 1

## 2011-07-15 SURGICAL SUPPLY — 62 items
BAG SPEC THK2 15X12 ZIP CLS (MISCELLANEOUS) ×1
BAG ZIPLOCK 12X15 (MISCELLANEOUS) ×2 IMPLANT
BANDAGE ACE 4 STERILE (GAUZE/BANDAGES/DRESSINGS) ×1 IMPLANT
BANDAGE ELASTIC 4 VELCRO ST LF (GAUZE/BANDAGES/DRESSINGS) ×2 IMPLANT
BANDAGE ELASTIC 6 VELCRO ST LF (GAUZE/BANDAGES/DRESSINGS) ×2 IMPLANT
BANDAGE ESMARK 6X9 LF (GAUZE/BANDAGES/DRESSINGS) ×1 IMPLANT
BLADE HEX COATED 2.75 (ELECTRODE) ×1 IMPLANT
BLADE SAG 18X100X1.27 (BLADE) ×2 IMPLANT
BLADE SAW SGTL 13.0X1.19X90.0M (BLADE) ×2 IMPLANT
BNDG CMPR 9X6 STRL LF SNTH (GAUZE/BANDAGES/DRESSINGS) ×1
BNDG ESMARK 6X9 LF (GAUZE/BANDAGES/DRESSINGS) ×2
BOOTIES KNEE HIGH SLOAN (MISCELLANEOUS) ×2 IMPLANT
BOWL SMART MIX CTS (DISPOSABLE) ×2 IMPLANT
CEMENT HV SMART SET (Cement) ×3 IMPLANT
CLOTH BEACON ORANGE TIMEOUT ST (SAFETY) ×2 IMPLANT
COVER SURGICAL LIGHT HANDLE (MISCELLANEOUS) ×2 IMPLANT
DRAPE EXTREMITY T 121X128X90 (DRAPE) ×2 IMPLANT
DRAPE POUCH INSTRU U-SHP 10X18 (DRAPES) ×2 IMPLANT
DRAPE U-SHAPE 47X51 STRL (DRAPES) ×2 IMPLANT
DRSG ADAPTIC 3X8 NADH LF (GAUZE/BANDAGES/DRESSINGS) ×2 IMPLANT
DRSG EMULSION OIL 3X16 NADH (GAUZE/BANDAGES/DRESSINGS) ×1 IMPLANT
DRSG PAD ABDOMINAL 8X10 ST (GAUZE/BANDAGES/DRESSINGS) ×2 IMPLANT
DURAPREP 26ML APPLICATOR (WOUND CARE) ×2 IMPLANT
ELECT REM PT RETURN 9FT ADLT (ELECTROSURGICAL) ×2
ELECTRODE REM PT RTRN 9FT ADLT (ELECTROSURGICAL) ×1 IMPLANT
EVACUATOR 1/8 PVC DRAIN (DRAIN) ×2 IMPLANT
FACESHIELD LNG OPTICON STERILE (SAFETY) ×3 IMPLANT
GLOVE BIO SURGEON STRL SZ8 (GLOVE) ×2 IMPLANT
GLOVE BIOGEL PI IND STRL 8 (GLOVE) IMPLANT
GLOVE BIOGEL PI INDICATOR 8 (GLOVE) ×2
GLOVE ECLIPSE 7.5 STRL STRAW (GLOVE) ×2 IMPLANT
GLOVE SURG ORTHO 8.0 STRL STRW (GLOVE) ×1 IMPLANT
GLOVE SURG SS PI 6.5 STRL IVOR (GLOVE) ×2 IMPLANT
GLOVE SURG SS PI 7.5 STRL IVOR (GLOVE) ×2 IMPLANT
GOWN PREVENTION PLUS XLARGE (GOWN DISPOSABLE) ×2 IMPLANT
GOWN STRL REIN XL XLG (GOWN DISPOSABLE) ×3 IMPLANT
HANDPIECE INTERPULSE COAX TIP (DISPOSABLE) ×2
HOOD PEEL AWAY FACE SHEILD DIS (HOOD) ×7 IMPLANT
IMMOBILIZER KNEE 20 (SOFTGOODS) ×2
IMMOBILIZER KNEE 20 THIGH 36 (SOFTGOODS) IMPLANT
KIT BASIN OR (CUSTOM PROCEDURE TRAY) ×2 IMPLANT
MANIFOLD NEPTUNE II (INSTRUMENTS) ×2 IMPLANT
MARKER SPHERE PSV REFLC THRD 5 (MARKER) ×3 IMPLANT
NS IRRIG 1000ML POUR BTL (IV SOLUTION) ×2 IMPLANT
PACK TOTAL JOINT (CUSTOM PROCEDURE TRAY) ×2 IMPLANT
PADDING CAST COTTON 6X4 STRL (CAST SUPPLIES) ×4 IMPLANT
PADDING WEBRIL 6 STERILE (GAUZE/BANDAGES/DRESSINGS) ×1 IMPLANT
POSITIONER SURGICAL ARM (MISCELLANEOUS) ×2 IMPLANT
SET HNDPC FAN SPRY TIP SCT (DISPOSABLE) ×1 IMPLANT
SPONGE GAUZE 4X4 12PLY (GAUZE/BANDAGES/DRESSINGS) ×2 IMPLANT
SPONGE GAUZE 4X4 STERILE 39 (GAUZE/BANDAGES/DRESSINGS) ×1 IMPLANT
STAPLER VISISTAT 35W (STAPLE) ×1 IMPLANT
SUT VIC AB 0 CT1 27 (SUTURE) ×4
SUT VIC AB 0 CT1 27XBRD ANTBC (SUTURE) ×2 IMPLANT
SUT VIC AB 1 CT1 27 (SUTURE) ×6
SUT VIC AB 1 CT1 27XBRD ANTBC (SUTURE) ×2 IMPLANT
SUT VIC AB 2-0 CT1 27 (SUTURE) ×2
SUT VIC AB 2-0 CT1 TAPERPNT 27 (SUTURE) ×1 IMPLANT
TAPE STRIPS DRAPE STRL (GAUZE/BANDAGES/DRESSINGS) ×1 IMPLANT
TOWEL OR 17X26 10 PK STRL BLUE (TOWEL DISPOSABLE) ×3 IMPLANT
TRAY FOLEY CATH 14FRSI W/METER (CATHETERS) ×2 IMPLANT
WATER STERILE IRR 1500ML POUR (IV SOLUTION) ×2 IMPLANT

## 2011-07-15 NOTE — Brief Op Note (Signed)
07/15/2011  9:38 AM  PATIENT:  Amanda Cain  66 y.o. female  PRE-OPERATIVE DIAGNOSIS:  Severe Degenerative Joint Disease Bilateral Knees  POST-OPERATIVE DIAGNOSIS:  Severe degenerative joint disease left knee  PROCEDURE:  Procedure(s) (LRB): COMPUTER ASSISTED TOTAL KNEE ARTHROPLASTY (Left)  SURGEON:  Surgeon(s) and Role:    * Alta Corning, MD - Primary  PHYSICIAN ASSISTANT:   ASSISTANTS: bethune   ANESTHESIA:   general  EBL:  Total I/O In: 1000 [I.V.:1000] Out: 75 [Urine:75]  BLOOD ADMINISTERED:none  DRAINS: (1) Hemovact drain(s) in the l. knee with  Suction Open   LOCAL MEDICATIONS USED:  NONE  SPECIMEN:  No Specimen  DISPOSITION OF SPECIMEN:  N/A  COUNTS:  YES  TOURNIQUET:   Total Tourniquet Time Documented: Thigh (Left) - 75 minutes  DICTATION: .Other Dictation: Dictation Number [id number]  PLAN OF CARE: Admit to inpatient   PATIENT DISPOSITION:  PACU - hemodynamically stable.   Delay start of Pharmacological VTE agent (>24hrs) due to surgical blood loss or risk of bleeding: no

## 2011-07-15 NOTE — Progress Notes (Signed)
ANTICOAGULATION CONSULT NOTE - Initial Consult  Pharmacy Consult for Warfarin Indication: VTE prophylaxis  No Known Allergies  Patient Measurements:   Last known ht/wt : 149 cm and 102.6 kg  Vital Signs: Temp: 97.6 F (36.4 C) (02/15 1307) Temp src: Oral (02/15 0540) BP: 146/80 mmHg (02/15 1307) Pulse Rate: 77  (02/15 1307)  Labs: No results found for this basename: HGB:2,HCT:3,PLT:3,APTT:3,LABPROT:3,INR:3,HEPARINUNFRC:3,CREATININE:3,CKTOTAL:3,CKMB:3,TROPONINI:3 in the last 72 hours CrCl is unknown because there is no height on file for the current visit.  Medical History: Past Medical History  Diagnosis Date  . Coronary artery disease   . GERD (gastroesophageal reflux disease)   . Depression   . Anxiety   . Hypothyroidism   . Diabetes mellitus   . Anemia     Medications:  Scheduled:    . acetaminophen  1,000 mg Intravenous Q6H  .  ceFAZolin (ANCEF) IV  2 g Intravenous Q6H  . docusate sodium  100 mg Oral BID  . escitalopram  20 mg Oral QHS  . estrogens (conjugated)  0.45 mg Oral Daily  . felodipine  5 mg Oral q morning - 10a  . hydrochlorothiazide  25 mg Oral QAC breakfast  . insulin aspart  0-15 Units Subcutaneous TID WC  . ketorolac  15 mg Intravenous Q8H  . levothyroxine  100 mcg Oral QAC breakfast  . LORazepam  0.5 mg Oral BID  . metFORMIN  1,000 mg Oral BID WC  . metoprolol  200 mg Oral QAC breakfast  . morphine   Intravenous Q4H  . olmesartan  40 mg Oral QAC breakfast  . pantoprazole  40 mg Oral Q1200  . rosuvastatin  20 mg Oral Daily  . vitamin B-12  1,000 mcg Oral Daily  . DISCONTD:  ceFAZolin (ANCEF) IV  2 g Intravenous 60 min Pre-Op  . DISCONTD: insulin aspart  0-15 Units Subcutaneous Q4H  . DISCONTD: olmesartan-hydrochlorothiazide  1 tablet Oral QAC breakfast  . DISCONTD: povidone-iodine   Topical Once   Infusions:    . sodium chloride      Assessment:  66 y.o. female admit for knee pain s/p L TKA on 2/15  AET: 2/15 at 1040  Baseline  coags within normal limits  Warfarin points = 6  Goal of Therapy:  INR 2-3   Plan:   Warfarin 7.5mg  PO tonight at 2000  Daily INR, CBC    Gretta Arab PharmD  Pager [phone] 07/15/2011 1:45 PM
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
## 2011-07-15 NOTE — Preoperative (Addendum)
Beta Blockers   Reason not to administer Beta Blockers:Not Applicable 

## 2011-07-15 NOTE — Anesthesia Preprocedure Evaluation (Addendum)
Anesthesia Evaluation  Patient identified by MRN, date of birth, ID band Patient awake    Reviewed: Allergy & Precautions, H&P , NPO status , Patient's Chart, lab work & pertinent test results, reviewed documented beta blocker date and time   Airway Mallampati: II TM Distance: >3 FB Neck ROM: Full    Dental  (+) Partial Lower and Partial Upper   Pulmonary neg pulmonary ROS,  clear to auscultation  + decreased breath sounds      Cardiovascular hypertension, Regular Normal Denies cardiac symptoms   Neuro/Psych Negative Neurological ROS  Negative Psych ROS   GI/Hepatic negative GI ROS, Neg liver ROS,   Endo/Other  Diabetes mellitus-, Well Controlled, Type 2, Oral Hypoglycemic AgentsHypothyroidism Morbid obesityThyroid replacement  Renal/GU negative Renal ROS  Genitourinary negative   Musculoskeletal negative musculoskeletal ROS (+)   Abdominal   Peds negative pediatric ROS (+)  Hematology Anemia Hgb 10.4   Anesthesia Other Findings   Reproductive/Obstetrics negative OB ROS                          Anesthesia Physical Anesthesia Plan  ASA: III  Anesthesia Plan: General   Post-op Pain Management:    Induction: Intravenous  Airway Management Planned: Oral ETT  Additional Equipment:   Intra-op Plan:   Post-operative Plan: Extubation in OR  Informed Consent:   Dental advisory given  Plan Discussed with: CRNA and Surgeon  Anesthesia Plan Comments: (Surgeon requests knee block-left)       Anesthesia Quick Evaluation

## 2011-07-15 NOTE — Anesthesia Procedure Notes (Signed)
Anesthesia Regional Block:  Femoral nerve block  Pre-Anesthetic Checklist: ,, timeout performed, Correct Patient, Correct Site, Correct Laterality, Correct Procedure, Correct Position, site marked, Risks and benefits discussed,  Surgical consent,  Pre-op evaluation,  At surgeon's request and post-op pain management  Laterality: Left  Prep: chloraprep       Needles:  Injection technique: Single-shot  Needle Type: Echogenic Stimulator Needle     Needle Length: 10cm 10 cm Needle Gauge: 21 G  Needle insertion depth: 3 cm   Additional Needles:  Procedures: ultrasound guided Femoral nerve block Narrative:  Start time: 07/15/2011 9:54 AM End time: 07/15/2011 10:03 AM  Performed by: Personally  Anesthesiologist: Angela Adam  Femoral nerve block

## 2011-07-15 NOTE — Plan of Care (Signed)
Problem: Consults Goal: Diagnosis- Total Joint Replacement Primary Total Knee     

## 2011-07-15 NOTE — Transfer of Care (Signed)
Immediate Anesthesia Transfer of Care Note  Patient: Amanda Cain  Procedure(s) Performed: Procedure(s) (LRB): COMPUTER ASSISTED TOTAL KNEE ARTHROPLASTY (Left)  Patient Location: PACU  Anesthesia Type: General  Level of Consciousness: awake, oriented, patient cooperative, lethargic and responds to stimulation  Airway & Oxygen Therapy: Patient Spontanous Breathing and Patient connected to T-piece oxygen  Post-op Assessment: Report given to PACU RN, Post -op Vital signs reviewed and stable and Patient moving all extremities  Post vital signs: Reviewed and stable  Complications: No apparent anesthesia complications

## 2011-07-15 NOTE — Op Note (Signed)
NAME:  Amanda Cain, Amanda Cain              ACCOUNT NO.:  [id number]  MEDICAL RECORD NO.:  [id number]  LOCATION:  74                         FACILITY:  [facility]  PHYSICIAN:  Alta Corning, M.D.   DATE OF BIRTH:  16-Jun-1945  DATE OF PROCEDURE:  07/15/2011 DATE OF DISCHARGE:                              OPERATIVE REPORT   POSTOPERATIVE DIAGNOSIS:  End-stage degenerative joint disease, left knee, with severe varus malalignment.  POSTPROCEDURE DIAGNOSIS:  End-stage degenerative joint disease, left knee, with severe varus malalignment.  PROCEDURE: 1. Left total knee replacement with a Sigma system, size 2 femur, size     2 MBT revision tray, 15 mm bridging bearing, and a 35 mm all-     polyethylene patella. 2. Computer-assisted left total knee replacement.  SURGEON:  Alta Corning, M.D.  ASSISTANT:  Gary Fleet, P.A.  ANESTHESIA:  General.  BRIEF HISTORY:  Amanda Cain is a 66 year old female with a long history of severe degenerative joint disease of both knees.  She has had bone-on- bone x-ray changes for many years.  She had been treated conservatively with activity modification, antiinflammatory medications, use of a cane. After failure of all these,  because of severe unrelenting nighttime pain and bone-on-bone changes on x-ray, the patient was taken to the operating room for a left total knee replacement.  Because of her varus malalignment and large size it was felt a computer assistance was appropriate.  This was chosen to be used preoperatively.  PROCEDURE IN DETAIL:  The patient was taken to the operative room. After adequate anesthesia was obtained with a general anesthetic, the patient was placed on the operating table.  The left leg was prepped, draped in sterile fashion.  Following this, the leg was exsanguinated. Blood pressure tourniquet was inflated to 350 mmHg.  Following this, attention was turned to the left leg where after routine prep and drape, an incision was  made subcutaneously to the level of the extensor mechanism.  A medial parapatellar arthrotomy was undertaken.  Once this was completed, attention was turned inside the knee where the anterior and posterior cruciates were removed as well as medial and lateral meniscus, retropatellar fat pad and synovium in the anterior aspect of the femur.  Once that was completed, attention was then turned to the knee where the computer modules were placed, 2 pins in the tibia, 2 pins in the femur, this adds 30 minutes to the surgical procedure.  Once this was completed, attention was turned towards the registration process, which adds again 30 minutes to the total surgical time.  Once this was completed, the tibia was cut perpendicular to its long axis under computer assistance.  The femur was cut perpendicular to the anatomic axis under computer assistance, spacer blocks put in place.  At this time, attention was turned to the femur, which was sized to a size 2. Anterior and posterior cuts were made as well as chamfer cuts and box. Once this was completed, attention was turned to the tibia, sized to a 2, was drilled and keeled for an MBT style stem.  Once the trials were put in place and alignment was assessed with the computer, perfect neutral long alignment,  perfect gap balance.  Attention was then turned back to the patella, which cut down to a level 14 mm.  A 35 mm paddle was chosen and the lugs were drilled.  The lugs were drilled in the femur.  The trial components were then all removed.  The knee was copiously and thoroughly lavaged with normal saline irrigation and suctioned dry.  The final components were then cemented into place, a size 2 femur, a size 2 MBT revision tray, a 12.5 mm bridging bearing.  A trial was placed and a 35 mm all poly patella was put in place and a handle was used to secure that.  Once that was completed, the cement was allowed to harden.  All excess bone cement was  removed.  Attention was then turned towards removing the trial poly.  Bleeding was controlled with electrocautery at this point and a drain was placed.  The final poly was put in place.  Final range of motions were checked and the computer modules were removed at this time.  Once this was completed, attention was turned towards closure.  The medial parapatellar joint was closed with #1 Vicryl running, skin with 0 and 2-0 Vicryl, and then skin staples.  A sterile compressive dressing was applied as well as knee immobilizer.  The patient was taken to recovery room where she was noted to be in satisfactory condition.  Estimated blood loss for this procedure was less than 50 mL.     Alta Corning, M.D.     Amanda Cain  D:  07/15/2011  T:  07/15/2011  Job:  [id number]

## 2011-07-15 NOTE — Anesthesia Postprocedure Evaluation (Signed)
  Anesthesia Post-op Note  Patient: Amanda Cain  Procedure(s) Performed: Procedure(s) (LRB): COMPUTER ASSISTED TOTAL KNEE ARTHROPLASTY (Left)  Patient Location: PACU  Anesthesia Type: GA combined with regional for post-op pain  Level of Consciousness: oriented and sedated  Airway and Oxygen Therapy: Patient Spontanous Breathing and Patient connected to nasal cannula oxygen  Post-op Pain: mild  Post-op Assessment: Post-op Vital signs reviewed, Patient's Cardiovascular Status Stable, Respiratory Function Stable and Patent Airway  Post-op Vital Signs: stable  Complications: No apparent anesthesia complications

## 2011-07-15 NOTE — Transfer of Care (Signed)
Immediate Anesthesia Transfer of Care Note  Patient: Amanda Cain  Procedure(s) Performed: Procedure(s) (LRB): COMPUTER ASSISTED TOTAL KNEE ARTHROPLASTY (Left)  Patient Location: PACU  Anesthesia Type: General  Level of Consciousness: awake, oriented, sedated, patient cooperative and lethargic  Airway & Oxygen Therapy: Patient Spontanous Breathing and Patient connected to face mask oxygen  Post-op Assessment: Report given to PACU RN, Post -op Vital signs reviewed and stable and Patient moving all extremities  Post vital signs: Reviewed and stable  Complications: No apparent anesthesia complications brought to pacu intubated due to sleepy and difficult intubation.  In pacu after 5 minutes more alert.  Good respirations follows commands.  Indicates wants tube out.  Suctioned and extubated to o2 maintaining airway placed on face mask O2, good resps. Dr Thereasa Parkin present

## 2011-07-16 LAB — PROTIME-INR
INR: 1.14 (ref 0.00–1.49)
Prothrombin Time: 14.8 seconds (ref 11.6–15.2)

## 2011-07-16 LAB — CBC
HCT: 26 % — ABNORMAL LOW (ref 36.0–46.0)
Hemoglobin: 8.2 g/dL — ABNORMAL LOW (ref 12.0–15.0)
MCH: 26.6 pg (ref 26.0–34.0)
MCHC: 31.5 g/dL (ref 30.0–36.0)
MCV: 84.4 fL (ref 78.0–100.0)
Platelets: 343 10*3/uL (ref 150–400)
RBC: 3.08 MIL/uL — ABNORMAL LOW (ref 3.87–5.11)
RDW: 17.6 % — ABNORMAL HIGH (ref 11.5–15.5)
WBC: 7.6 10*3/uL (ref 4.0–10.5)

## 2011-07-16 LAB — BASIC METABOLIC PANEL
BUN: 13 mg/dL (ref 6–23)
CO2: 27 mEq/L (ref 19–32)
Calcium: 7.7 mg/dL — ABNORMAL LOW (ref 8.4–10.5)
Chloride: 99 mEq/L (ref 96–112)
Creatinine, Ser: 1.23 mg/dL — ABNORMAL HIGH (ref 0.50–1.10)
GFR calc Af Amer: 51 mL/min — ABNORMAL LOW (ref >90)
GFR calc non Af Amer: 44 mL/min — ABNORMAL LOW (ref >90)
Glucose, Bld: 145 mg/dL — ABNORMAL HIGH (ref 70–99)
Potassium: 3.4 mEq/L — ABNORMAL LOW (ref 3.5–5.1)
Sodium: 132 mEq/L — ABNORMAL LOW (ref 135–145)

## 2011-07-16 LAB — GLUCOSE, CAPILLARY
Glucose-Capillary: 135 mg/dL — ABNORMAL HIGH (ref 70–99)
Glucose-Capillary: 148 mg/dL — ABNORMAL HIGH (ref 70–99)
Glucose-Capillary: 157 mg/dL — ABNORMAL HIGH (ref 70–99)
Glucose-Capillary: 148 mg/dL — ABNORMAL HIGH (ref 70–99)

## 2011-07-16 LAB — PREPARE RBC (CROSSMATCH)

## 2011-07-16 MED ORDER — WARFARIN SODIUM 7.5 MG PO TABS
7.5000 mg | ORAL_TABLET | Freq: Once | ORAL | Status: AC
  Administered 2011-07-16: 7.5 mg via ORAL
  Filled 2011-07-16: qty 1

## 2011-07-16 NOTE — Progress Notes (Signed)
ANTICOAGULATION CONSULT NOTE - Follow Up Consult  Pharmacy Consult for Coumadin Indication: VTE prophylaxis  No Known Allergies  Patient Measurements: Height: 4' 10.5" (148.6 cm) Weight: 226 lb 4.8 oz (102.649 kg) IBW/kg (Calculated) : 42.05   Vital Signs: Temp: 99.3 F (37.4 C) (02/16 1430) Temp src: Oral (02/16 1430) BP: 165/80 mmHg (02/16 1430) Pulse Rate: 80  (02/16 1430)  Labs:  Basename 07/16/11 0415  HGB 8.2*  HCT 26.0*  PLT 343  APTT --  LABPROT 14.8  INR 1.14  HEPARINUNFRC --  CREATININE 1.23*  CKTOTAL --  CKMB --  TROPONINI --   Estimated Creatinine Clearance: 46.5 ml/min (by C-G formula based on Cr of 1.23).  Assessment:  53 YOF s/p L TKA 2/15 on Coumadin for VTE prophylaxis.    Warfarin points = 6  INR 1.15 s/p 1 dose of Coumadin 7.5 mg  Hgb 8.2 - transfusing 2 units of PRBCs per Ortho  Goal of Therapy:  INR 2-3   Plan:   Repeat Coumadin 7.5 mg po x 1  F/u CBC in AM  Darnell Stimson Thi 07/16/2011,2:40 PM
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
## 2011-07-16 NOTE — Progress Notes (Signed)
Pt reports minimal pain.  AVSS Looks comforatble NVI Dressing intact  Hg 8.2  POD #1 after L TKA - transfuse 2 U prbcs - coumadin - pain control

## 2011-07-17 LAB — BASIC METABOLIC PANEL
BUN: 8 mg/dL (ref 6–23)
CO2: 27 mEq/L (ref 19–32)
Calcium: 8.2 mg/dL — ABNORMAL LOW (ref 8.4–10.5)
Chloride: 93 mEq/L — ABNORMAL LOW (ref 96–112)
Creatinine, Ser: 0.73 mg/dL (ref 0.50–1.10)
GFR calc Af Amer: >90 mL/min (ref >90)
GFR calc non Af Amer: 86 mL/min — ABNORMAL LOW (ref >90)
Glucose, Bld: 125 mg/dL — ABNORMAL HIGH (ref 70–99)
Potassium: 3 mEq/L — ABNORMAL LOW (ref 3.5–5.1)
Sodium: 129 mEq/L — ABNORMAL LOW (ref 135–145)

## 2011-07-17 LAB — CBC
HCT: 33 % — ABNORMAL LOW (ref 36.0–46.0)
Hemoglobin: 10.8 g/dL — ABNORMAL LOW (ref 12.0–15.0)
MCH: 26.5 pg (ref 26.0–34.0)
MCHC: 32.7 g/dL (ref 30.0–36.0)
MCV: 81 fL (ref 78.0–100.0)
Platelets: 276 10*3/uL (ref 150–400)
RBC: 4.11 MIL/uL (ref 3.87–5.11)
RDW: 16.6 % — ABNORMAL HIGH (ref 11.5–15.5)
WBC: 10.4 10*3/uL (ref 4.0–10.5)

## 2011-07-17 LAB — TYPE AND SCREEN
ABO/RH(D): O POS
Antibody Screen: POSITIVE
DAT, IgG: NEGATIVE
Unit division: 0
Unit division: 0

## 2011-07-17 LAB — PROTIME-INR
INR: 1.55 — ABNORMAL HIGH (ref 0.00–1.49)
Prothrombin Time: 18.9 seconds — ABNORMAL HIGH (ref 11.6–15.2)

## 2011-07-17 LAB — GLUCOSE, CAPILLARY
Glucose-Capillary: 124 mg/dL — ABNORMAL HIGH (ref 70–99)
Glucose-Capillary: 199 mg/dL — ABNORMAL HIGH (ref 70–99)
Glucose-Capillary: 156 mg/dL — ABNORMAL HIGH (ref 70–99)
Glucose-Capillary: 119 mg/dL — ABNORMAL HIGH (ref 70–99)

## 2011-07-17 MED ORDER — WARFARIN SODIUM 5 MG PO TABS
5.0000 mg | ORAL_TABLET | Freq: Once | ORAL | Status: AC
  Administered 2011-07-17: 5 mg via ORAL
  Filled 2011-07-17: qty 1

## 2011-07-17 NOTE — Progress Notes (Signed)
Pt reports minimal pain.   AVSS  Looks comforatble  NVI  Dressing intact  Hg 8.2 --> 10.8  POD #2 after L TKA  - up with PT today - pain control - coumadin - d/c IVF, PCA, O2

## 2011-07-17 NOTE — Progress Notes (Signed)
ANTICOAGULATION CONSULT NOTE - Follow Up Consult  Pharmacy Consult for Coumadin Indication: VTE prophylaxis  No Known Allergies  Patient Measurements: Height: 4' 10.5" (148.6 cm) Weight: 226 lb 4.8 oz (102.649 kg) IBW/kg (Calculated) : 42.05   Vital Signs:    Labs:  Basename 07/17/11 0628 07/16/11 0415  HGB 10.8* 8.2*  HCT 33.0* 26.0*  PLT 276 343  APTT -- --  LABPROT 18.9* 14.8  INR 1.55* 1.14  HEPARINUNFRC -- --  CREATININE 0.73 1.23*  CKTOTAL -- --  CKMB -- --  TROPONINI -- --   Estimated Creatinine Clearance: 71.4 ml/min (by C-G formula based on Cr of 0.73).  Assessment:  57 YOF s/p L TKA 2/15 on Coumadin for VTE prophylaxis.    INR moving toward goal after 2 coumadin doses of 7.5 mg   Hgb improved today after transfusion  No bleeding reported  Goal of Therapy:  INR 2-3   Plan:   Coumadin 5 mg po x 1  Follow AM INR and CBC  Isidro Monks, Gaye Alken PharmD 1:48 PM 07/17/2011

## 2011-07-17 NOTE — Progress Notes (Addendum)
Physical Therapy Treatment Patient Details Name: Amanda Cain MRN: [id number] DOB: 25-Oct-1945 Today's Date: 07/17/2011  PT Assessment/Plan  PT - Assessment/Plan Comments on Treatment Session: Increased gait distance with less assistance needed today. PT Plan: Discharge plan remains appropriate;Frequency remains appropriate PT Frequency: 7X/week Follow Up Recommendations: Skilled nursing facility Equipment Recommended: Defer to next venue PT Goals  Acute Rehab PT Goals PT Goal: Supine/Side to Sit - Progress: Other (comment) (not addressed today) PT Goal: Sit to Supine/Side - Progress: Other (comment) (not addressed today) PT Goal: Sit to Stand - Progress: Progressing toward goal PT Goal: Stand to Sit - Progress: Progressing toward goal PT Goal: Ambulate - Progress: Progressing toward goal PT Goal: Perform Home Exercise Program - Progress: Met  PT Treatment Precautions/Restrictions  Precautions Precautions: Knee Required Braces or Orthoses: Yes Knee Immobilizer: On when out of bed or walking Restrictions Weight Bearing Restrictions: Yes RLE Weight Bearing: Weight bearing as tolerated Mobility (including Balance) Bed Mobility Bed Mobility: No (pt OOB before and after PT session) Transfers Sit to Stand: 4: Min assist;From chair/3-in-1;With upper extremity assist;With armrests Sit to Stand Details (indicate cue type and reason): cues for ant wt shifting to assist with standing. cues for hand/LE placement for safety with standing. Stand to Sit: 4: Min assist;To chair/3-in-1;With upper extremity assist;With armrests Stand to Sit Details: cues/assist for hand/LE placement with sitting down for safety and assist to control descent with sitting down. Ambulation/Gait Ambulation/Gait: Yes Ambulation/Gait Assistance: 4: Min assist;Other (comment) (+1 for safety/chair follow) Ambulation/Gait Assistance Details (indicate cue type and reason): cues for posture, RW postion with gait.  cues/assit for RW negotiation and posture. cues to increase bil step length, left wt shifting with stance and  to keep hips with hands in walker (pt with tendency to get walker too far out from herself). cues to slow down for safety with gait as well.    Distance: 80 feet Assistive device: Rolling walker Gait Pattern: Step-through pattern;Decreased step length - right;Decreased step length - left;Decreased stride length;Decreased hip/knee flexion - leftt;Trunk flexed Gait velocity: increased. cues/assist to control RW with gait and keep RW from getting too far away from pt with fast gait speed.  Posture/Postural Control Posture/Postural Control: No significant limitations Balance Balance Assessed: No Exercise  Total Joint Exercises Ankle Circles/Pumps: AROM;Both;10 reps;Seated Quad Sets: AROM;10 reps;Supine;Left Heel Slides: AAROM;10 reps;Supine;Left Hip ABduction/ADduction: AAROM;10 reps;Supine;Left Straight Leg Raises: AAROM;10 reps;Supine;Left End of Session PT - End of Session Equipment Utilized During Treatment: Gait belt;Left knee immobilizer Activity Tolerance: Patient tolerated treatment well Patient left: in chair;with call bell in reach;with family/visitor present Nurse Communication: Mobility status for transfers;Weight bearing status General Behavior During Session: [facility] for tasks performed Cognition: [facility] for tasks performed  Willow Ora 07/17/2011, 1:28 PM  Willow Ora, PTA Office- [phone] Weekend pager: [phone]

## 2011-07-18 LAB — BASIC METABOLIC PANEL
BUN: 9 mg/dL (ref 6–23)
CO2: 29 mEq/L (ref 19–32)
Calcium: 8.6 mg/dL (ref 8.4–10.5)
Chloride: 90 mEq/L — ABNORMAL LOW (ref 96–112)
Creatinine, Ser: 0.83 mg/dL (ref 0.50–1.10)
GFR calc Af Amer: 83 mL/min — ABNORMAL LOW (ref >90)
GFR calc non Af Amer: 71 mL/min — ABNORMAL LOW (ref >90)
Glucose, Bld: 140 mg/dL — ABNORMAL HIGH (ref 70–99)
Potassium: 3.1 mEq/L — ABNORMAL LOW (ref 3.5–5.1)
Sodium: 129 mEq/L — ABNORMAL LOW (ref 135–145)

## 2011-07-18 LAB — GLUCOSE, CAPILLARY
Glucose-Capillary: 145 mg/dL — ABNORMAL HIGH (ref 70–99)
Glucose-Capillary: 176 mg/dL — ABNORMAL HIGH (ref 70–99)
Glucose-Capillary: 145 mg/dL — ABNORMAL HIGH (ref 70–99)
Glucose-Capillary: 155 mg/dL — ABNORMAL HIGH (ref 70–99)

## 2011-07-18 LAB — CBC
HCT: 32.8 % — ABNORMAL LOW (ref 36.0–46.0)
Hemoglobin: 10.9 g/dL — ABNORMAL LOW (ref 12.0–15.0)
MCH: 26.7 pg (ref 26.0–34.0)
MCHC: 33.2 g/dL (ref 30.0–36.0)
MCV: 80.2 fL (ref 78.0–100.0)
Platelets: 307 10*3/uL (ref 150–400)
RBC: 4.09 MIL/uL (ref 3.87–5.11)
RDW: 16.5 % — ABNORMAL HIGH (ref 11.5–15.5)
WBC: 10.5 10*3/uL (ref 4.0–10.5)

## 2011-07-18 LAB — PROTIME-INR
INR: 1.83 — ABNORMAL HIGH (ref 0.00–1.49)
Prothrombin Time: 21.5 seconds — ABNORMAL HIGH (ref 11.6–15.2)

## 2011-07-18 MED ORDER — WARFARIN SODIUM 5 MG PO TABS
5.0000 mg | ORAL_TABLET | Freq: Once | ORAL | Status: AC
  Administered 2011-07-18: 5 mg via ORAL
  Filled 2011-07-18: qty 1

## 2011-07-18 NOTE — Progress Notes (Addendum)
Physical Therapy Treatment Patient Details Name: DAYLANI LOUDENSLAGER MRN: [id number] DOB: 06/11/1945 Today's Date: 07/18/2011  PT Assessment/Plan  PT - Assessment/Plan Comments on Treatment Session: Pt continues to have increase gait speed when ambulating with RW.  Also demonstrates left veering during am session.  Provided max cuing with visual cues to correct.  Pt states she can see and feel herself veering, however is not easily corrected.   PT Plan: Discharge plan remains appropriate;Frequency remains appropriate PT Frequency: 7X/week Follow Up Recommendations: Skilled nursing facility Equipment Recommended: Defer to next venue PT Goals  Acute Rehab PT Goals PT Goal Formulation: With patient Time For Goal Achievement: 7 days Pt will go Sit to Stand: with supervision PT Goal: Sit to Stand - Progress: Updated due to goal met Pt will go Stand to Sit: with supervision PT Goal: Stand to Sit - Progress: Updated due to goals met Pt will Ambulate: 51 - 150 feet;with supervision;with rolling walker PT Goal: Ambulate - Progress: Progressing toward goal Pt will Perform Home Exercise Program: with min assist PT Goal: Perform Home Exercise Program - Progress: Progressing toward goal  PT Treatment Precautions/Restrictions  Precautions Precautions: Knee Required Braces or Orthoses: Yes Knee Immobilizer: On when out of bed or walking Restrictions Weight Bearing Restrictions: Yes LLE Weight Bearing: Weight bearing as tolerated Mobility (including Balance) Bed Mobility Bed Mobility: No Transfers Transfers: Yes Sit to Stand: 4: Min assist;With upper extremity assist;With armrests;From chair/3-in-1 Sit to Stand Details (indicate cue type and reason): Min/guard for safety with cues for hand placement and LLE management for safety.  Stand to Sit: 4: Min assist;With upper extremity assist;To chair/3-in-1;With armrests Stand to Sit Details: Requires cues for hand placement for controlled descent and  LLE management.  Cues and assist for safety.  Ambulation/Gait Ambulation/Gait: Yes Ambulation/Gait Assistance: 4: Min assist Ambulation/Gait Assistance Details (indicate cue type and reason): Cues for sequencing and technique with RW, cues to slow cadence for safety and for proper RW placement.  Pt demonstrates left veering when ambulating.  Tacile and verbal cuing provided for pt.  Pt states she can feel herself veering, however requires max cuing and visual feedback to ambulate down center of hallway.  Ambulation Distance (Feet): 85 Feet Assistive device: Rolling walker Gait Pattern: Step-through pattern;Decreased step length - right;Decreased step length - left;Decreased stride length;Decreased hip/knee flexion - right;Trunk flexed Gait velocity: Pt continues to have increased cadence with therapist providing cues for slow gait speed and proper placement of RW.   Balance Balance Assessed: No Exercise  Total Joint Exercises Ankle Circles/Pumps: AROM;Both;20 reps;Seated (Cues to perform these throughout the day.) End of Session PT - End of Session Equipment Utilized During Treatment: Left knee immobilizer Activity Tolerance: Patient tolerated treatment well Patient left: in chair;with call bell in reach;with family/visitor present Nurse Communication: Mobility status for transfers;Weight bearing status General Behavior During Session: [facility] for tasks performed Cognition: [facility] for tasks performed  Page, Betha Loa 07/18/2011, 10:38 AM

## 2011-07-18 NOTE — Clinical Documentation Improvement (Signed)
GENERIC DOCUMENTATION CLARIFICATION QUERY  THIS DOCUMENT IS NOT A PERMANENT PART OF THE MEDICAL RECORD  TO RESPOND TO THE THIS QUERY, FOLLOW THE INSTRUCTIONS BELOW:  1. If needed, update documentation for the patient's encounter via the notes activity.  2. Access this query again and click edit on the In [employer].  3. After updating, or not, click F2 to complete all highlighted (required) fields concerning your review. Select "additional documentation in the medical record" OR "no additional documentation provided".  4. Click Sign note button.  5. The deficiency will fall out of your In Basket *Please let us know if you are not able to complete this workflow by phone or e-mail (listed below).  Please update your documentation within the medical record to reflect your response to this query.                                                                                        07/18/11   Dear Dr. Berenice Primas, [facility],  In a better effort to capture your patient's severity of illness, reflect appropriate length of stay and utilization of resources, a review of the patient medical record has revealed the following indicators.    Based on your clinical judgment, please clarify and document in a progress note and/or discharge summary the clinical condition associated with the following supporting information:  In responding to this query please exercise your independent judgment.  The fact that a query is asked, does not imply that any particular answer is desired or expected.  Pt admitted with severe DDD bilateral knee.  Based on lab results NA= 129 treated with  0.9 %  sodium chloride infusion       Please clarify the underlying diagnosis responsible abnormal lab results and document in pn and d/c summary. Thank you!  Best Practice: List all diagnosis/conditions associated with abnormal lab/test result in progress notes or d/c summary.   Possible Clinical  Conditions? _______Other Condition__________________ _______Cannot Clinically Determine   Supporting Information:  Risk Factors:  Signs & Symptoms:  Diagnostics: Component Sodium  Latest Ref Rng 135 - 145 mEq/L  07/17/2011 129 (L)  07/18/2011 129 (L)   Treatment  You may use possible, probable, or suspect with inpatient documentation. possible, probable, suspected diagnoses MUST be documented at the time of discharge  Reviewed:  no additional documentation provided 2/21/2013ljh   Thank You,  Heloise Beecham RN, BSN, CCDS Clinical Documentation Specialist [facility] Dept Pager: [phone] / E-mail: [email or web address]

## 2011-07-18 NOTE — Progress Notes (Signed)
Physical Therapy Treatment Patient Details Name: Amanda Cain MRN: [id number] DOB: 07-20-1945 Today's Date: 07/18/2011  PT Assessment/Plan  PT - Assessment/Plan Comments on Treatment Session: Pt demonstrates better ambulation sequencing/technique duing pm session.  Continues to require verbal and tactile cuing for proper RW placement due to pt tendency to let RW get too far ahead.   PT Plan: Discharge plan remains appropriate;Frequency remains appropriate PT Frequency: 7X/week Follow Up Recommendations: Skilled nursing facility Equipment Recommended: Defer to next venue PT Goals  Acute Rehab PT Goals PT Goal Formulation: With patient Time For Goal Achievement: 7 days Pt will go Supine/Side to Sit: with min assist PT Goal: Supine/Side to Sit - Progress: Met Pt will go Sit to Stand: with supervision PT Goal: Sit to Stand - Progress: Progressing toward goal Pt will go Stand to Sit: with supervision PT Goal: Stand to Sit - Progress: Progressing toward goal Pt will Ambulate: 51 - 150 feet;with supervision;with rolling walker PT Goal: Ambulate - Progress: Progressing toward goal Pt will Perform Home Exercise Program: with min assist PT Goal: Perform Home Exercise Program - Progress: Progressing toward goal  PT Treatment Precautions/Restrictions  Precautions Precautions: Knee Required Braces or Orthoses: Yes Knee Immobilizer: On when out of bed or walking Restrictions Weight Bearing Restrictions: Yes LLE Weight Bearing: Weight bearing as tolerated Mobility (including Balance) Bed Mobility Bed Mobility: Yes Sit to Supine: 4: Min assist;HOB elevated (comment degrees);With rail Sit to Supine - Details (indicate cue type and reason): Min A required for LLE off of bed with cues for UE placement to assist trunk.  Transfers Transfers: Yes Sit to Stand: 4: Min assist;With upper extremity assist;From bed Sit to Stand Details (indicate cue type and reason): Min/guard for safety with cues  for hand placement and LLE management for safety.  Stand to Sit: 4: Min assist;With upper extremity assist;With armrests;To chair/3-in-1 Stand to Sit Details: Min/guard for safety with cues for hand placement and LLE management.  Ambulation/Gait Ambulation/Gait: Yes Ambulation/Gait Assistance: 4: Min assist Ambulation/Gait Assistance Details (indicate cue type and reason): Pt demonstrates less left veering during pm session.  Pt states that she "didn't know why she was doing that."  Continues to require verbal and tactile cuing for proper RW placement due to pt tendency to let RW get too far ahead.  Also requires cuing for increased step length B LE.  Ambulation Distance (Feet): 80 Feet Assistive device: Rolling walker Gait Pattern: Step-through pattern;Decreased step length - right;Decreased step length - left;Decreased stride length;Decreased hip/knee flexion - right;Trunk flexed Gait velocity: Improving.  Pt with decreased gait speed during pm session.      Exercise  Total Joint Exercises Ankle Circles/Pumps: AROM;Both;20 reps;Seated Quad Sets: AROM;10 reps;Left;Seated Short Arc QuadSinclair Cain;Left;10 reps;Seated Heel Slides: AAROM;10 reps;Left;Seated Hip ABduction/ADduction: AAROM;10 reps;Left;Seated Straight Leg Raises: AAROM;10 reps;Left;Seated End of Session PT - End of Session Equipment Utilized During Treatment: Left knee immobilizer Activity Tolerance: Patient tolerated treatment well Patient left: in chair;with call bell in reach;with family/visitor present Nurse Communication: Mobility status for transfers;Weight bearing status General Behavior During Session: [facility] for tasks performed Cognition: [facility] for tasks performed  Page, Amanda Cain 07/18/2011, 4:00 PM

## 2011-07-18 NOTE — Progress Notes (Signed)
FL2 in shadow chart for MD signature. CSW assisting with D/C planning. Pt plans to have ST SNF placement at [facility] following hospitalization. SNF has confirmed availability. Will assist with d/c planning to SNF 2/19.

## 2011-07-18 NOTE — Clinical Documentation Improvement (Signed)
BMI DOCUMENTATION CLARIFICATION QUERY  THIS DOCUMENT IS NOT A PERMANENT PART OF THE MEDICAL RECORD  TO RESPOND TO THE THIS QUERY, FOLLOW THE INSTRUCTIONS BELOW:  1. If needed, update documentation for the patient's encounter via the notes activity.  2. Access this query again and click edit on the In [employer].  3. After updating, or not, click F2 to complete all highlighted (required) fields concerning your review. Select "additional documentation in the medical record" OR "no additional documentation provided".  4. Click Sign note button.  5. The deficiency will fall out of your In Basket *Please let us know if you are not able to complete this workflow by phone or e-mail (listed below).         07/18/11  Dear Dr. Berenice Primas, Lenna Sciara Rolley Sims  In an effort to better capture your patient's severity of illness, reflect appropriate length of stay and utilization of resources, a review of the patient medical record has revealed the following indicators.    Based on your clinical judgment, please clarify and document in a progress note and/or discharge summary the clinical condition associated with the following supporting information:  In responding to this query please exercise your independent judgment.  The fact that a query is asked, does not imply that any particular answer is desired or expected.  Pt's BMI=  46.6.    Please clarify whether or not BMI can be linked to one of he diagnoses listed below and document in pn  and d/c. Thank You!  BEST PRACTICE: When linking BMI to a diagnosis please document both BMI and diagnosis together in pn for accuracy of SOI and ROM.  Thank you for all that you do for our patients!   Possible Clinical conditions  Morbid Obesity W/ BMI=   Underweight w/BMI=  Other condition___________________  Cannot Clinically determine _____________  Risk Factors: Sign & Symptoms:  BMI-46.6 4'10.5"/226lbs   Treatment monitoring Carb modified  diet CBG for blood glucose  Reviewed:  no additional documentation provided 07/21/2011 ljh   Thank You,  Heloise Beecham  RN, BSN, CCDS Clinical Documentation Specialist [facility] Pager: [phone] / E-mail: [email or web address]

## 2011-07-18 NOTE — Progress Notes (Signed)
ANTICOAGULATION CONSULT NOTE - Follow Up Consult  Pharmacy Consult for Coumadin Indication: VTE prophylaxis  No Known Allergies  Patient Measurements: Height: 4' 10.5" (148.6 cm) Weight: 226 lb 4.8 oz (102.649 kg) IBW/kg (Calculated) : 42.05   Vital Signs: Temp: 98 F (36.7 C) (02/18 0514) Temp src: Oral (02/18 0514) BP: 187/79 mmHg (02/18 0514) Pulse Rate: 76  (02/18 0514)  Labs:  Basename 07/18/11 0423 07/17/11 0628 07/16/11 0415  HGB 10.9* 10.8* --  HCT 32.8* 33.0* 26.0*  PLT 307 276 343  APTT -- -- --  LABPROT 21.5* 18.9* 14.8  INR 1.83* 1.55* 1.14  HEPARINUNFRC -- -- --  CREATININE 0.83 0.73 1.23*  CKTOTAL -- -- --  CKMB -- -- --  TROPONINI -- -- --    Inpatient warfarin doses this admission: 7.5, 7.5, 5mg  (2/15-2/17)  Counseled patient today on purpose, proper use, and potential adverse effects of warfarin.  Topics covered included drug- and food-interactions, need to report any bleeding, need for ongoing INR monitoring.  Patient verbalized understanding.  Questions were answered.  Assessment:  67 YOF s/p L TKA 2/15 on warfarin for VTE prophylaxis.    INR responding.  Goal of Therapy:  INR 2-3   Plan:   Repeat warfarin 5mg  PO today  Follow INR daily while inpatient.  Clayburn Pert, PharmD, BCPS Pager: [phone] 07/18/2011  9:48 AM

## 2011-07-18 NOTE — Progress Notes (Signed)
Subjective: 3 Days Post-Op Procedure(s) (LRB): COMPUTER ASSISTED TOTAL KNEE ARTHROPLASTY (Left) Patient reports pain as moderate. Taking po/voiding ok. Making progress with PT.   Objective: Vital signs in last 24 hours: Temp:  [98 F (36.7 C)-98.8 F (37.1 C)] 98 F (36.7 C) (02/18 0514) Pulse Rate:  [76-82] 76  (02/18 0514) Resp:  [16-17] 17  (02/18 0514) BP: (167-187)/(79-91) 187/79 mmHg (02/18 0514) SpO2:  [95 %] 95 % (02/18 0514) FiO2 (%):  [98 %-100 %] 100 % (02/18 0514)  Intake/Output from previous day: 02/17 0701 - 02/18 0700 In: 609 [P.O.:600; I.V.:9] Out: 1952 [Urine:1950; Stool:2] Intake/Output this shift: Total I/O In: 240 [P.O.:240] Out: 500 [Urine:500]   Basename 07/18/11 0423 07/17/11 0628 07/16/11 0415  HGB 10.9* 10.8* 8.2*    Basename 07/18/11 0423 07/17/11 0628  WBC 10.5 10.4  RBC 4.09 4.11  HCT 32.8* 33.0*  PLT 307 276    Basename 07/18/11 0423 07/17/11 0628  NA 129* 129*  K 3.1* 3.0*  CL 90* 93*  CO2 29 27  BUN 9 8  CREATININE 0.83 0.73  GLUCOSE 140* 125*  CALCIUM 8.6 8.2*    Basename 07/18/11 0423 07/17/11 0628  LABPT -- --  INR 1.83* 1.55*  Left Knee exam: Wound benign. Calf soft. N-V intact distally. Pt alert and oriented.  Assessment/Plan: 3 Days Post-Op Procedure(s) (LRB): COMPUTER ASSISTED TOTAL KNEE ARTHROPLASTY (Left) PLAN: Up with therapy Discharge to SNF in AM when bed available. Dressing changed. Cont coumadin per pharmacy.  Amanda Cain G 07/18/2011, 12:16 PM

## 2011-07-19 LAB — GLUCOSE, CAPILLARY: Glucose-Capillary: 151 mg/dL — ABNORMAL HIGH (ref 70–99)

## 2011-07-19 LAB — PROTIME-INR
INR: 2.18 — ABNORMAL HIGH (ref 0.00–1.49)
Prothrombin Time: 24.6 seconds — ABNORMAL HIGH (ref 11.6–15.2)

## 2011-07-19 MED ORDER — HYDROCHLOROTHIAZIDE 25 MG PO TABS: 25.0000 mg | ORAL_TABLET | Freq: Every day | ORAL | Status: DC

## 2011-07-19 MED ORDER — OXYCODONE HCL 5 MG PO TABS: 5.0000 mg | ORAL_TABLET | ORAL | Status: AC | PRN

## 2011-07-19 MED ORDER — WARFARIN SODIUM 5 MG PO TABS: ORAL_TABLET | ORAL | Status: DC

## 2011-07-19 NOTE — Progress Notes (Signed)
Subjective: 4 Days Post-Op Procedure(s) (LRB): COMPUTER ASSISTED TOTAL KNEE ARTHROPLASTY (Left) Patient reports pain as moderate. Voiding /taking po ok.   Objective: Vital signs in last 24 hours: Temp:  [98.4 F (36.9 C)-98.7 F (37.1 C)] 98.7 F (37.1 C) (02/19 0500) Pulse Rate:  [78-92] 78  (02/19 0500) Resp:  [16] 16  (02/19 0500) BP: (121-156)/(73-83) 155/83 mmHg (02/19 0500) SpO2:  [95 %] 95 % (02/19 0500)  Intake/Output from previous day: 02/18 0701 - 02/19 0700 In: 840 [P.O.:840] Out: 1150 [Urine:1150] Intake/Output this shift:     Basename 07/18/11 0423 07/17/11 0628  HGB 10.9* 10.8*    Basename 07/18/11 0423 07/17/11 0628  WBC 10.5 10.4  RBC 4.09 4.11  HCT 32.8* 33.0*  PLT 307 276    Basename 07/18/11 0423 07/17/11 0628  NA 129* 129*  K 3.1* 3.0*  CL 90* 93*  CO2 29 27  BUN 9 8  CREATININE 0.83 0.73  GLUCOSE 140* 125*  CALCIUM 8.6 8.2*    Basename 07/19/11 0424 07/18/11 0423  LABPT -- --  INR 2.18* 1.83*  Left knee exam:  Neurovascular intact Sensation intact distally Intact pulses distally Dorsiflexion/Plantar flexion intact Incision: no drainage  Assessment/Plan: 4 Days Post-Op Procedure(s) (LRB): COMPUTER ASSISTED TOTAL KNEE ARTHROPLASTY (Left)  PLAN: Discharge to SNF F/U Dr Berenice Primas in 2 weeks.  Alean Kromer G 07/19/2011, 8:48 AM

## 2011-07-19 NOTE — Progress Notes (Signed)
ANTICOAGULATION CONSULT NOTE - Follow Up Consult  Pharmacy Consult for Coumadin Indication: VTE prophylaxis  No Known Allergies  Patient Measurements: Height: 4' 10.5" (148.6 cm) Weight: 226 lb 4.8 oz (102.649 kg) IBW/kg (Calculated) : 42.05   Vital Signs: Temp: 98.7 F (37.1 C) (02/19 0500) Temp src: Oral (02/19 0500) BP: 155/83 mmHg (02/19 0500) Pulse Rate: 78  (02/19 0500)  Labs:  Basename 07/19/11 0424 07/18/11 0423 07/17/11 0628  HGB -- 10.9* 10.8*  HCT -- 32.8* 33.0*  PLT -- 307 276  APTT -- -- --  LABPROT 24.6* 21.5* 18.9*  INR 2.18* 1.83* 1.55*  HEPARINUNFRC -- -- --  CREATININE -- 0.83 0.73  CKTOTAL -- -- --  CKMB -- -- --  TROPONINI -- -- --    Inpatient warfarin doses this admission: 7.5, 7.5, 5, 5mg  (2/15-2/18)  Assessment:  21 YOF s/p L TKA 2/15 on warfarin for VTE prophylaxis.    INR therapeutic, 2.18 today  Goal of Therapy:  INR 2-3   Plan:   Agree with MD's plan for 5mg  po daily  Suggest checking PT/INR Thursday 2/21 at SNF and titrate dose to goal INR 2-3  Peggyann Juba, PharmD, BCPS Pager: [phone] 07/19/2011  10:45 AM

## 2011-07-19 NOTE — Discharge Summary (Signed)
Patient ID: Amanda Cain MRN: [id number] DOB/AGE: 68-Feb-1947 68 y.o.  Admit date: 07/15/2011 Discharge date: 07/19/2011  Admission Diagnoses:  Principal Problem:  *Osteoarthritis of left knee Active Problems:  Diabetes mellitus  Obesity (BMI 30.0-34.9)   Discharge Diagnoses:  Same  Past Medical History  Diagnosis Date  . Coronary artery disease   . GERD (gastroesophageal reflux disease)   . Depression   . Anxiety   . Hypothyroidism   . Diabetes mellitus   . Anemia     Surgeries: Procedure(s):Left COMPUTER ASSISTED TOTAL KNEE ARTHROPLASTY on 07/15/2011    Discharged Condition: Improved  Hospital Course: Amanda Cain is an 66 y.o. female who was admitted 07/15/2011 for operative treatment ofOsteoarthritis of left knee. Patient has severe unremitting pain that affects sleep, daily activities, and work/hobbies. After pre-op clearance the patient was taken to the operating room on 07/15/2011 and underwent  Procedure(s): COMPUTER ASSISTED TOTAL KNEE ARTHROPLASTY.    Patient was given perioperative antibiotics: Anti-infectives     Start     Dose/Rate Route Frequency Ordered Stop   07/15/11 1400   ceFAZolin (ANCEF) IVPB 2 g/50 mL premix        2 g 100 mL/hr over 30 Minutes Intravenous Every 6 hours 07/15/11 1322 07/16/11 0300   07/15/11 0615   ceFAZolin (ANCEF) IVPB 2 g/50 mL premix  Status:  Discontinued        2 g 100 mL/hr over 30 Minutes Intravenous 60 min pre-op 07/15/11 0603 07/15/11 1254           Patient was given sequential compression devices, early ambulation, and chemoprophylaxis to prevent DVT.  Patient benefited maximally from hospital stay and there were no complications.    Recent vital signs: Patient Vitals for the past 24 hrs:  BP Temp Temp src Pulse Resp SpO2  07/19/11 0500 155/83 mmHg 98.7 F (37.1 C) Oral 78  16  95 %  30-Jul-2011 2120 121/73 mmHg 98.4 F (36.9 C) Oral 82  16  95 %  Jul 30, 2011 1415 156/83 mmHg 98.4 F (36.9 C) Oral 92  16  95 %      Recent laboratory studies:  Basename 07/19/11 0424 07-30-11 0423 07/17/11 0628  WBC -- 10.5 10.4  HGB -- 10.9* 10.8*  HCT -- 32.8* 33.0*  PLT -- 307 276  NA -- 129* 129*  K -- 3.1* 3.0*  CL -- 90* 93*  CO2 -- 29 27  BUN -- 9 8  CREATININE -- 0.83 0.73  GLUCOSE -- 140* 125*  INR 2.18* 1.83* --  CALCIUM -- 8.6 --     Discharge Medications:   Medication List  As of 07/19/2011  8:51 AM   STOP taking these medications         aspirin EC 81 MG tablet      Biotin 1000 MCG tablet      Flaxseed (Linseed) 1200 MG Caps      Glucosamine-Chondroitin 250-200 MG Tabs         TAKE these medications         acetaminophen 500 MG tablet   Commonly known as: TYLENOL   Take 500 mg by mouth every 6 (six) hours as needed. For pain      atorvastatin 40 MG tablet   Commonly known as: LIPITOR   Take 40 mg by mouth every evening.      Calcium Carbonate-Vitamin D 600-400 MG-UNIT per tablet   Take 1 tablet by mouth 2 (two) times daily.  escitalopram 20 MG tablet   Commonly known as: LEXAPRO   Take 20 mg by mouth at bedtime.      estrogens (conjugated) 0.45 MG tablet   Commonly known as: PREMARIN   Take 0.45 mg by mouth daily.      felodipine 5 MG 24 hr tablet   Commonly known as: PLENDIL   Take 5 mg by mouth every morning.      hydrochlorothiazide 25 MG tablet   Commonly known as: HYDRODIURIL   Take 1 tablet (25 mg total) by mouth daily before breakfast.      INVEGA SUSTENNA 117 MG/0.75ML Susp   Generic drug: Paliperidone Palmitate   Inject 117 mg into the muscle every 30 (thirty) days.      levothyroxine 100 MCG tablet   Commonly known as: SYNTHROID, LEVOTHROID   Take 100 mcg by mouth daily before breakfast.      LORazepam 0.5 MG tablet   Commonly known as: ATIVAN   Take 0.5 mg by mouth 2 (two) times daily.      metFORMIN 1000 MG tablet   Commonly known as: GLUCOPHAGE   Take 1,000 mg by mouth 2 (two) times daily with a meal.      metoprolol 200 MG 24 hr  tablet   Commonly known as: TOPROL-XL   Take 200 mg by mouth daily before breakfast.      mulitivitamin with minerals Tabs   Take 1 tablet by mouth daily.      olmesartan-hydrochlorothiazide 40-25 MG per tablet   Commonly known as: BENICAR HCT   Take 1 tablet by mouth daily before breakfast.      omeprazole 20 MG capsule   Commonly known as: PRILOSEC   Take 20 mg by mouth every evening.      OVER THE COUNTER MEDICATION   Iron 65 mg daily for last 6 weeks      oxyCODONE 5 MG immediate release tablet   Commonly known as: Oxy IR/ROXICODONE   Take 1-2 tablets (5-10 mg total) by mouth every 3 (three) hours as needed.      vitamin B-12 1000 MCG tablet   Commonly known as: CYANOCOBALAMIN   Take 1,000 mcg by mouth daily.      vitamin C 500 MG tablet   Commonly known as: ASCORBIC ACID   Take 500 mg by mouth daily.      vitamin E 400 UNIT capsule   Generic drug: vitamin E   Take 400 Units by mouth daily.      warfarin 5 MG tablet   Commonly known as: COUMADIN   ONE TABLET DAILY PER PHARMACY TO MAINTAIN INR OF AROUND 2.0. USE X 1 MONTH POST OP.            Diagnostic Studies: No results found.  Disposition: SNF  Discharge Orders    Future Orders Please Complete By Expires   Diet Carb Modified      Weight Bearing as taught in Physical Therapy      Scheduling Instructions:   WBAT   Comments:   Use a walker or crutches as instructed.   Discharge wound care:      Comments:   If you have a hip bandage, keep it clean and dry.  Change your bandage as instructed by your health care providers.  If your bandage has been discontinued, keep your incision clean and dry.  Pat dry after bathing.  DO NOT put lotion or powder on your incision.   CPM  Comments:   Continuous passive motion machine (CPM):      Use the CPM from0 to 60 for 8 hours per day.      You may increase by 10 per day.  You may break it up into 2 or 3 sessions per day.      Use CPM for 1-2 weeks or until you  are told to stop.   Do not put a pillow under the knee. Place it under the heel.         Follow-up Information    Follow up with GRAVES,JOHN L, MD in 2 weeks.   Contact information:   [place] [phone]           Signed: Erlene Senters 07/19/2011, 8:51 AM

## 2011-07-19 NOTE — Progress Notes (Signed)
Physical Therapy Treatment Patient Details Name: Amanda Cain MRN: [id number] DOB: 08-18-1945 Today's Date: 07/19/2011  L TKR POD # 4 9:35 - 10:00 1 gt  1 te  PT Assessment/Plan  PT - Assessment/Plan Comments on Treatment Session: Pt plans to D/C to SNF today @ [facility] PT Plan: Discharge plan remains appropriate Follow Up Recommendations: Skilled nursing facility Equipment Recommended: Defer to next venue PT Goals  Acute Rehab PT Goals PT Goal Formulation: With patient Pt will go Supine/Side to Sit: with min assist PT Goal: Supine/Side to Sit - Progress: Progressing toward goal Pt will go Sit to Supine/Side: with min assist PT Goal: Sit to Supine/Side - Progress: Progressing toward goal Pt will go Sit to Stand: with supervision PT Goal: Sit to Stand - Progress: Progressing toward goal Pt will go Stand to Sit: with supervision PT Goal: Stand to Sit - Progress: Progressing toward goal Pt will Ambulate: 51 - 150 feet;with supervision;with rolling walker PT Goal: Ambulate - Progress: Progressing toward goal Pt will Perform Home Exercise Program: with min assist PT Goal: Perform Home Exercise Program - Progress: Progressing toward goal  PT Treatment Precautions/Restrictions  Precautions Precautions: Knee Precaution Comments: Pt instructed on KI use for amb Required Braces or Orthoses: Yes Knee Immobilizer: Discontinue once straight leg raise with < 10 degree lag Restrictions Weight Bearing Restrictions: No LLE Weight Bearing: Weight bearing as tolerated Mobility (including Balance) Bed Mobility Bed Mobility: No (Pt OOB in recliner) Transfers Transfers: Yes Sit to Stand: 4: Min assist;From chair/3-in-1 Sit to Stand Details (indicate cue type and reason): 25% VC's on hand placement to push self up from chair vs pull up from walker. Stand to Sit: 4: Min assist;To chair/3-in-1 Stand to Sit Details: 25% VC's to reach back prior and extend L LE prior to sit, plus required  inceased time. Ambulation/Gait Ambulation/Gait: Yes Ambulation/Gait Assistance: 4: Min assist Ambulation/Gait Assistance Details (indicate cue type and reason): 50% VC's to increase WB thru L LE and decrease gait speed to increase safety Ambulation Distance (Feet): 75 Feet Assistive device: Rolling walker Gait Pattern: Step-to pattern;Decreased stance time - left;Trunk flexed Gait velocity: Pt c/o 3/10 knee pain with act......Marland Kitchenapplied ICE at end of sesson Stairs: No Wheelchair Mobility Wheelchair Mobility: No    Exercise  Total Joint Exercises Ankle Circles/Pumps: AROM;Both;10 reps Quad Sets: AROM;Both;10 reps Gluteal Sets: AROM;Both;10 reps Towel Squeeze: AROM;Both;10 reps Heel Slides: AAROM;Left;10 reps Straight Leg Raises: AAROM;Left;10 reps End of Session PT - End of Session Equipment Utilized During Treatment: Gait belt;Left knee immobilizer Activity Tolerance: Patient limited by fatigue Patient left: in chair;with call bell in reach;with family/visitor present General Behavior During Session: [facility] for tasks performed Cognition: [facility] for tasks performed  Rica Koyanagi  PTA [facility]  Acute  Rehab Pager     [phone]

## 2011-07-19 NOTE — Progress Notes (Signed)
Pt for d/c to SNF-[facility] today. IV d/c'd. Ace wrap to L knee/LLE in place. Pt ambulates to BR with walker & w/ assist. No changes in am assessments today. D/C instructions discussed with pt & dau with verbalized understanding. Awaiting for transport to SNF at this time. Had requested for pain med-given for d/c.

## 2011-07-19 NOTE — Progress Notes (Signed)
Pt to be d/c to [facility] via [facility] for [facility] SNF placement today.

## 2011-08-01 ENCOUNTER — Encounter (HOSPITAL_COMMUNITY): Payer: Self-pay | Admitting: Orthopedic Surgery

## 2011-08-29 ENCOUNTER — Ambulatory Visit: Payer: Medicare Other | Attending: Orthopedic Surgery | Admitting: Physical Therapy

## 2011-08-29 DIAGNOSIS — R262 Difficulty in walking, not elsewhere classified: Secondary | ICD-10-CM | POA: Insufficient documentation

## 2011-08-29 DIAGNOSIS — IMO0001 Reserved for inherently not codable concepts without codable children: Secondary | ICD-10-CM | POA: Insufficient documentation

## 2011-08-29 DIAGNOSIS — M6281 Muscle weakness (generalized): Secondary | ICD-10-CM | POA: Insufficient documentation

## 2011-08-29 DIAGNOSIS — M25569 Pain in unspecified knee: Secondary | ICD-10-CM | POA: Insufficient documentation

## 2011-08-29 DIAGNOSIS — M25669 Stiffness of unspecified knee, not elsewhere classified: Secondary | ICD-10-CM | POA: Insufficient documentation

## 2011-09-05 ENCOUNTER — Encounter: Payer: Medicare Other | Admitting: Physical Therapy

## 2011-09-06 ENCOUNTER — Encounter: Payer: Medicare Other | Admitting: Physical Therapy

## 2011-09-08 ENCOUNTER — Ambulatory Visit: Payer: Medicare Other | Admitting: Rehabilitative and Restorative Service Providers"

## 2011-09-12 ENCOUNTER — Ambulatory Visit: Payer: Medicare Other | Admitting: Physical Therapy

## 2011-09-14 ENCOUNTER — Encounter: Payer: Medicare Other | Admitting: Physical Therapy

## 2011-09-15 ENCOUNTER — Ambulatory Visit: Payer: Medicare Other | Admitting: Physical Therapy

## 2011-09-19 ENCOUNTER — Ambulatory Visit: Payer: Medicare Other | Admitting: Physical Therapy

## 2011-09-26 ENCOUNTER — Ambulatory Visit: Payer: Medicare Other | Admitting: Physical Therapy

## 2011-10-03 ENCOUNTER — Encounter: Payer: Medicare Other | Admitting: Physical Therapy

## 2011-11-10 ENCOUNTER — Telehealth: Payer: Self-pay | Admitting: Hematology and Oncology

## 2011-11-10 NOTE — Telephone Encounter (Signed)
S/w the pt and she is aware of her new pt appt with dr odogwu on 12/06/2011 per pt's request

## 2011-11-11 ENCOUNTER — Telehealth: Payer: Self-pay | Admitting: Hematology and Oncology

## 2011-11-11 NOTE — Telephone Encounter (Signed)
pt called and r/s appt on 07/09 to 07/02

## 2011-11-14 ENCOUNTER — Telehealth: Payer: Self-pay | Admitting: Hematology and Oncology

## 2011-11-14 NOTE — Telephone Encounter (Signed)
Referred by Dr. Mayra Neer Dx- Chronic Anemia

## 2011-11-19 ENCOUNTER — Encounter (HOSPITAL_COMMUNITY): Payer: Self-pay | Admitting: *Deleted

## 2011-11-19 ENCOUNTER — Emergency Department (HOSPITAL_COMMUNITY): Payer: Medicare Other

## 2011-11-19 ENCOUNTER — Inpatient Hospital Stay (HOSPITAL_COMMUNITY)
Admission: EM | Admit: 2011-11-19 | Discharge: 2011-11-30 | DRG: 871 | Disposition: A | Discharge status: Home Health | Payer: Medicare Other | Attending: Family Medicine | Admitting: Family Medicine

## 2011-11-19 DIAGNOSIS — N189 Chronic kidney disease, unspecified: Secondary | ICD-10-CM | POA: Diagnosis present

## 2011-11-19 DIAGNOSIS — N289 Disorder of kidney and ureter, unspecified: Secondary | ICD-10-CM

## 2011-11-19 DIAGNOSIS — E875 Hyperkalemia: Secondary | ICD-10-CM | POA: Diagnosis not present

## 2011-11-19 DIAGNOSIS — K8020 Calculus of gallbladder without cholecystitis without obstruction: Secondary | ICD-10-CM

## 2011-11-19 DIAGNOSIS — E669 Obesity, unspecified: Secondary | ICD-10-CM

## 2011-11-19 DIAGNOSIS — N170 Acute kidney failure with tubular necrosis: Secondary | ICD-10-CM | POA: Diagnosis present

## 2011-11-19 DIAGNOSIS — R6521 Severe sepsis with septic shock: Secondary | ICD-10-CM

## 2011-11-19 DIAGNOSIS — N390 Urinary tract infection, site not specified: Secondary | ICD-10-CM

## 2011-11-19 DIAGNOSIS — R579 Shock, unspecified: Secondary | ICD-10-CM

## 2011-11-19 DIAGNOSIS — R188 Other ascites: Secondary | ICD-10-CM | POA: Diagnosis present

## 2011-11-19 DIAGNOSIS — R109 Unspecified abdominal pain: Secondary | ICD-10-CM | POA: Diagnosis present

## 2011-11-19 DIAGNOSIS — E871 Hypo-osmolality and hyponatremia: Secondary | ICD-10-CM

## 2011-11-19 DIAGNOSIS — K652 Spontaneous bacterial peritonitis: Secondary | ICD-10-CM

## 2011-11-19 DIAGNOSIS — R55 Syncope and collapse: Secondary | ICD-10-CM

## 2011-11-19 DIAGNOSIS — D649 Anemia, unspecified: Secondary | ICD-10-CM

## 2011-11-19 DIAGNOSIS — Z6841 Body Mass Index (BMI) 40.0 and over, adult: Secondary | ICD-10-CM

## 2011-11-19 DIAGNOSIS — K259 Gastric ulcer, unspecified as acute or chronic, without hemorrhage or perforation: Secondary | ICD-10-CM | POA: Diagnosis present

## 2011-11-19 DIAGNOSIS — R739 Hyperglycemia, unspecified: Secondary | ICD-10-CM | POA: Diagnosis present

## 2011-11-19 DIAGNOSIS — R0902 Hypoxemia: Secondary | ICD-10-CM | POA: Diagnosis not present

## 2011-11-19 DIAGNOSIS — E440 Moderate protein-calorie malnutrition: Secondary | ICD-10-CM

## 2011-11-19 DIAGNOSIS — A419 Sepsis, unspecified organism: Principal | ICD-10-CM

## 2011-11-19 DIAGNOSIS — R112 Nausea with vomiting, unspecified: Secondary | ICD-10-CM | POA: Diagnosis present

## 2011-11-19 DIAGNOSIS — E039 Hypothyroidism, unspecified: Secondary | ICD-10-CM

## 2011-11-19 DIAGNOSIS — E66811 Obesity, class 1: Secondary | ICD-10-CM

## 2011-11-19 DIAGNOSIS — E118 Type 2 diabetes mellitus with unspecified complications: Secondary | ICD-10-CM | POA: Diagnosis present

## 2011-11-19 DIAGNOSIS — E860 Dehydration: Secondary | ICD-10-CM

## 2011-11-19 DIAGNOSIS — E785 Hyperlipidemia, unspecified: Secondary | ICD-10-CM

## 2011-11-19 DIAGNOSIS — I10 Essential (primary) hypertension: Secondary | ICD-10-CM

## 2011-11-19 DIAGNOSIS — N179 Acute kidney failure, unspecified: Secondary | ICD-10-CM

## 2011-11-19 DIAGNOSIS — E119 Type 2 diabetes mellitus without complications: Secondary | ICD-10-CM

## 2011-11-19 HISTORY — DX: Essential (primary) hypertension: I10

## 2011-11-19 LAB — DIFFERENTIAL
Basophils Absolute: 0 10*3/uL (ref 0.0–0.1)
Basophils Relative: 0 % (ref 0–1)
Eosinophils Absolute: 0.4 10*3/uL (ref 0.0–0.7)
Eosinophils Relative: 5 % (ref 0–5)
Lymphocytes Relative: 17 % (ref 12–46)
Lymphs Abs: 1.6 10*3/uL (ref 0.7–4.0)
Monocytes Absolute: 0.9 10*3/uL (ref 0.1–1.0)
Monocytes Relative: 10 % (ref 3–12)
Neutro Abs: 6.2 10*3/uL (ref 1.7–7.7)
Neutrophils Relative %: 68 % (ref 43–77)

## 2011-11-19 LAB — URINALYSIS, ROUTINE W REFLEX MICROSCOPIC
Bilirubin Urine: NEGATIVE
Glucose, UA: NEGATIVE mg/dL
Hgb urine dipstick: NEGATIVE
Nitrite: NEGATIVE
Protein, ur: NEGATIVE mg/dL
Specific Gravity, Urine: 1.025 (ref 1.005–1.030)
Urobilinogen, UA: 0.2 mg/dL (ref 0.0–1.0)
pH: 5 (ref 5.0–8.0)

## 2011-11-19 LAB — PROTIME-INR
INR: 1.12 (ref 0.00–1.49)
Prothrombin Time: 14.6 seconds (ref 11.6–15.2)

## 2011-11-19 LAB — CBC
HCT: 30.9 % — ABNORMAL LOW (ref 36.0–46.0)
Hemoglobin: 10 g/dL — ABNORMAL LOW (ref 12.0–15.0)
MCH: 26.3 pg (ref 26.0–34.0)
MCHC: 32.4 g/dL (ref 30.0–36.0)
MCV: 81.3 fL (ref 78.0–100.0)
Platelets: 348 10*3/uL (ref 150–400)
RBC: 3.8 MIL/uL — ABNORMAL LOW (ref 3.87–5.11)
RDW: 16.2 % — ABNORMAL HIGH (ref 11.5–15.5)
WBC: 9.1 10*3/uL (ref 4.0–10.5)

## 2011-11-19 LAB — COMPREHENSIVE METABOLIC PANEL
ALT: 11 U/L (ref 0–35)
AST: 18 U/L (ref 0–37)
Albumin: 3.2 g/dL — ABNORMAL LOW (ref 3.5–5.2)
Alkaline Phosphatase: 76 U/L (ref 39–117)
BUN: 35 mg/dL — ABNORMAL HIGH (ref 6–23)
CO2: 26 mEq/L (ref 19–32)
Calcium: 9.1 mg/dL (ref 8.4–10.5)
Chloride: 86 mEq/L — ABNORMAL LOW (ref 96–112)
Creatinine, Ser: 2.59 mg/dL — ABNORMAL HIGH (ref 0.50–1.10)
GFR calc Af Amer: 21 mL/min — ABNORMAL LOW (ref >90)
GFR calc non Af Amer: 18 mL/min — ABNORMAL LOW (ref >90)
Glucose, Bld: 170 mg/dL — ABNORMAL HIGH (ref 70–99)
Potassium: 4 mEq/L (ref 3.5–5.1)
Sodium: 123 mEq/L — ABNORMAL LOW (ref 135–145)
Total Bilirubin: 0.4 mg/dL (ref 0.3–1.2)
Total Protein: 6.6 g/dL (ref 6.0–8.3)

## 2011-11-19 LAB — PRO B NATRIURETIC PEPTIDE: Pro B Natriuretic peptide (BNP): 130.6 pg/mL — ABNORMAL HIGH (ref 0–125)

## 2011-11-19 LAB — LIPASE, BLOOD: Lipase: 18 U/L (ref 11–59)

## 2011-11-19 LAB — GLUCOSE, CAPILLARY: Glucose-Capillary: 148 mg/dL — ABNORMAL HIGH (ref 70–99)

## 2011-11-19 LAB — URINE MICROSCOPIC-ADD ON

## 2011-11-19 LAB — MAGNESIUM: Magnesium: 1.6 mg/dL (ref 1.5–2.5)

## 2011-11-19 LAB — APTT: aPTT: 35 seconds (ref 24–37)

## 2011-11-19 LAB — PHOSPHORUS: Phosphorus: 3.3 mg/dL (ref 2.3–4.6)

## 2011-11-19 MED ORDER — PANTOPRAZOLE SODIUM 40 MG PO TBEC
40.0000 mg | DELAYED_RELEASE_TABLET | Freq: Every day | ORAL | Status: DC
  Administered 2011-11-19: 40 mg via ORAL
  Filled 2011-11-19 (×2): qty 1

## 2011-11-19 MED ORDER — VITAMIN C 500 MG PO TABS
500.0000 mg | ORAL_TABLET | Freq: Every day | ORAL | Status: DC
  Administered 2011-11-20: 500 mg via ORAL
  Filled 2011-11-19: qty 1

## 2011-11-19 MED ORDER — FERROUS SULFATE 325 (65 FE) MG PO TABS
325.0000 mg | ORAL_TABLET | Freq: Every day | ORAL | Status: DC
  Filled 2011-11-19 (×2): qty 1

## 2011-11-19 MED ORDER — ATORVASTATIN CALCIUM 40 MG PO TABS
40.0000 mg | ORAL_TABLET | Freq: Every day | ORAL | Status: DC
  Administered 2011-11-19: 40 mg via ORAL
  Filled 2011-11-19 (×4): qty 1

## 2011-11-19 MED ORDER — ADULT MULTIVITAMIN W/MINERALS CH
1.0000 | ORAL_TABLET | Freq: Every day | ORAL | Status: DC
  Administered 2011-11-20: 1 via ORAL
  Filled 2011-11-19 (×2): qty 1

## 2011-11-19 MED ORDER — ONDANSETRON HCL 4 MG/2ML IJ SOLN
4.0000 mg | Freq: Four times a day (QID) | INTRAMUSCULAR | Status: DC | PRN
  Administered 2011-11-20: 4 mg via INTRAVENOUS
  Filled 2011-11-19: qty 2

## 2011-11-19 MED ORDER — ONDANSETRON HCL 4 MG/2ML IJ SOLN
4.0000 mg | Freq: Once | INTRAMUSCULAR | Status: AC
  Administered 2011-11-19: 4 mg via INTRAVENOUS
  Filled 2011-11-19: qty 2

## 2011-11-19 MED ORDER — FUROSEMIDE 40 MG PO TABS
40.0000 mg | ORAL_TABLET | Freq: Every day | ORAL | Status: DC
  Filled 2011-11-19: qty 1

## 2011-11-19 MED ORDER — ONDANSETRON HCL 4 MG PO TABS: 4.0000 mg | ORAL_TABLET | Freq: Four times a day (QID) | ORAL | Status: DC | PRN

## 2011-11-19 MED ORDER — CALCIUM CARBONATE-VITAMIN D 600-400 MG-UNIT PO TABS: 1.0000 | ORAL_TABLET | Freq: Two times a day (BID) | ORAL | Status: DC

## 2011-11-19 MED ORDER — VITAMIN E 180 MG (400 UNIT) PO CAPS
400.0000 [IU] | ORAL_CAPSULE | Freq: Every day | ORAL | Status: DC
  Administered 2011-11-20: 400 [IU] via ORAL
  Filled 2011-11-19: qty 1

## 2011-11-19 MED ORDER — METOPROLOL SUCCINATE ER 100 MG PO TB24
200.0000 mg | ORAL_TABLET | Freq: Every day | ORAL | Status: DC
  Filled 2011-11-19 (×2): qty 2

## 2011-11-19 MED ORDER — INSULIN ASPART 100 UNIT/ML ~~LOC~~ SOLN: 0.0000 [IU] | Freq: Every day | SUBCUTANEOUS | Status: DC

## 2011-11-19 MED ORDER — ESCITALOPRAM OXALATE 20 MG PO TABS
20.0000 mg | ORAL_TABLET | Freq: Every day | ORAL | Status: DC
  Administered 2011-11-19 – 2011-11-20 (×2): 20 mg via ORAL
  Filled 2011-11-19 (×4): qty 1

## 2011-11-19 MED ORDER — SODIUM CHLORIDE 0.9 % IJ SOLN
3.0000 mL | Freq: Two times a day (BID) | INTRAMUSCULAR | Status: DC
  Administered 2011-11-21: 22:00:00 via INTRAVENOUS
  Administered 2011-11-22: 3 mL via INTRAVENOUS
  Administered 2011-11-22: 13 mL via INTRAVENOUS
  Administered 2011-11-23: 3 mL via INTRAVENOUS
  Administered 2011-11-23: 22:00:00 via INTRAVENOUS
  Administered 2011-11-24: 3 mL via INTRAVENOUS
  Administered 2011-11-24: 10 mL via INTRAVENOUS
  Administered 2011-11-25: 21:00:00 via INTRAVENOUS
  Administered 2011-11-25 – 2011-11-30 (×7): 3 mL via INTRAVENOUS

## 2011-11-19 MED ORDER — ZOLPIDEM TARTRATE 10 MG PO TABS
10.0000 mg | ORAL_TABLET | Freq: Once | ORAL | Status: AC
  Administered 2011-11-20: 10 mg via ORAL
  Filled 2011-11-19: qty 1

## 2011-11-19 MED ORDER — SODIUM CHLORIDE 0.9 % IV SOLN: INTRAVENOUS | Status: DC

## 2011-11-19 MED ORDER — INSULIN ASPART 100 UNIT/ML ~~LOC~~ SOLN
0.0000 [IU] | Freq: Three times a day (TID) | SUBCUTANEOUS | Status: DC
  Administered 2011-11-20 (×2): 5 [IU] via SUBCUTANEOUS
  Administered 2011-11-21 (×3): 3 [IU] via SUBCUTANEOUS

## 2011-11-19 MED ORDER — MORPHINE SULFATE 2 MG/ML IJ SOLN: 1.0000 mg | INTRAMUSCULAR | Status: DC | PRN

## 2011-11-19 MED ORDER — HYDROCODONE-ACETAMINOPHEN 5-325 MG PO TABS
1.0000 | ORAL_TABLET | ORAL | Status: DC | PRN
  Administered 2011-11-19: 1 via ORAL
  Filled 2011-11-19: qty 1

## 2011-11-19 MED ORDER — SODIUM CHLORIDE 0.9 % IV SOLN
Freq: Once | INTRAVENOUS | Status: AC
  Administered 2011-11-19: 12:00:00 via INTRAVENOUS

## 2011-11-19 MED ORDER — LORAZEPAM 0.5 MG PO TABS
0.5000 mg | ORAL_TABLET | Freq: Two times a day (BID) | ORAL | Status: DC
  Administered 2011-11-19 – 2011-11-20 (×2): 0.5 mg via ORAL
  Filled 2011-11-19: qty 1

## 2011-11-19 MED ORDER — SODIUM CHLORIDE 0.9 % IV BOLUS (SEPSIS)
500.0000 mL | Freq: Once | INTRAVENOUS | Status: AC
  Administered 2011-11-19: 500 mL via INTRAVENOUS

## 2011-11-19 MED ORDER — ENOXAPARIN SODIUM 30 MG/0.3ML ~~LOC~~ SOLN
30.0000 mg | SUBCUTANEOUS | Status: DC
  Administered 2011-11-19: 30 mg via SUBCUTANEOUS
  Filled 2011-11-19 (×2): qty 0.3

## 2011-11-19 MED ORDER — FELODIPINE ER 5 MG PO TB24
5.0000 mg | ORAL_TABLET | Freq: Every morning | ORAL | Status: DC
  Filled 2011-11-19: qty 1

## 2011-11-19 MED ORDER — CALCIUM CARBONATE-VITAMIN D 500-200 MG-UNIT PO TABS
1.0000 | ORAL_TABLET | Freq: Two times a day (BID) | ORAL | Status: DC
  Administered 2011-11-19 – 2011-11-20 (×2): 1 via ORAL
  Filled 2011-11-19 (×3): qty 1

## 2011-11-19 MED ORDER — SODIUM CHLORIDE 0.9 % IV SOLN
INTRAVENOUS | Status: DC
  Administered 2011-11-19: 19:00:00 via INTRAVENOUS
  Administered 2011-11-20: 1000 mL via INTRAVENOUS
  Administered 2011-11-20 – 2011-11-21 (×2): via INTRAVENOUS
  Administered 2011-11-21: 150 mL via INTRAVENOUS
  Administered 2011-11-22: 1000 mL via INTRAVENOUS

## 2011-11-19 MED ORDER — LEVOTHYROXINE SODIUM 100 MCG PO TABS
100.0000 ug | ORAL_TABLET | Freq: Every day | ORAL | Status: DC
  Administered 2011-11-20: 100 ug via ORAL
  Filled 2011-11-19 (×2): qty 1

## 2011-11-19 MED ORDER — VITAMIN B-12 1000 MCG PO TABS
1000.0000 ug | ORAL_TABLET | Freq: Every day | ORAL | Status: DC
  Administered 2011-11-20: 1000 ug via ORAL
  Filled 2011-11-19: qty 1

## 2011-11-19 NOTE — Progress Notes (Signed)
11-19-11 2300 tom [place] notified of pt arrival to floor as per order.  Tom Acknowledges and no new orders at this time.
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
## 2011-11-19 NOTE — ED Notes (Signed)
Patient transported to X-ray 

## 2011-11-19 NOTE — ED Notes (Signed)
Post residual void was zero (0) mL.
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
## 2011-11-19 NOTE — ED Notes (Signed)
Pt did not have an airway in place on arrival to ED - that was from her surgery on 2/15 and never charted as removed.  She also did not have an open incision to left knee - that was also from her surgery on 07/15/11.

## 2011-11-19 NOTE — ED Notes (Signed)
Radiology notified that pt had finished drinking PO contrast.

## 2011-11-19 NOTE — ED Notes (Signed)
Pt aware that urine specimen is needed, however,she is unable to void at the present time. She states that she will call when she is ready.

## 2011-11-19 NOTE — ED Notes (Signed)
RN to obtain labs with start of IV 

## 2011-11-19 NOTE — ED Notes (Addendum)
Pt states she began having N/V yesterday and then noticed when she urinated that she didn't feel as though she was emptying her bladder.  Pt called Dr. Arbutus Ped today and was instructed to go to the ED.  Pt denies pain anywhere.

## 2011-11-19 NOTE — ED Provider Notes (Addendum)
Patient evaluated post- CT imaging.  Results for orders placed during the hospital encounter of 11/19/11  CBC      Component Value Range   WBC 9.1  4.0 - 10.5 K/uL   RBC 3.80 (*) 3.87 - 5.11 MIL/uL   Hemoglobin 10.0 (*) 12.0 - 15.0 g/dL   HCT 30.9 (*) 36.0 - 46.0 %   MCV 81.3  78.0 - 100.0 fL   MCH 26.3  26.0 - 34.0 pg   MCHC 32.4  30.0 - 36.0 g/dL   RDW 16.2 (*) 11.5 - 15.5 %   Platelets 348  150 - 400 K/uL  DIFFERENTIAL      Component Value Range   Neutrophils Relative 68  43 - 77 %   Neutro Abs 6.2  1.7 - 7.7 K/uL   Lymphocytes Relative 17  12 - 46 %   Lymphs Abs 1.6  0.7 - 4.0 K/uL   Monocytes Relative 10  3 - 12 %   Monocytes Absolute 0.9  0.1 - 1.0 K/uL   Eosinophils Relative 5  0 - 5 %   Eosinophils Absolute 0.4  0.0 - 0.7 K/uL   Basophils Relative 0  0 - 1 %   Basophils Absolute 0.0  0.0 - 0.1 K/uL  COMPREHENSIVE METABOLIC PANEL      Component Value Range   Sodium 123 (*) 135 - 145 mEq/L   Potassium 4.0  3.5 - 5.1 mEq/L   Chloride 86 (*) 96 - 112 mEq/L   CO2 26  19 - 32 mEq/L   Glucose, Bld 170 (*) 70 - 99 mg/dL   BUN 35 (*) 6 - 23 mg/dL   Creatinine, Ser 2.59 (*) 0.50 - 1.10 mg/dL   Calcium 9.1  8.4 - 10.5 mg/dL   Total Protein 6.6  6.0 - 8.3 g/dL   Albumin 3.2 (*) 3.5 - 5.2 g/dL   AST 18  0 - 37 U/L   ALT 11  0 - 35 U/L   Alkaline Phosphatase 76  39 - 117 U/L   Total Bilirubin 0.4  0.3 - 1.2 mg/dL   GFR calc non Af Amer 18 (*) >90 mL/min   GFR calc Af Amer 21 (*) >90 mL/min  LIPASE, BLOOD      Component Value Range   Lipase 18  11 - 59 U/L  URINALYSIS, ROUTINE W REFLEX MICROSCOPIC      Component Value Range   Color, Urine YELLOW  YELLOW   APPearance CLOUDY (*) CLEAR   Specific Gravity, Urine 1.025  1.005 - 1.030   pH 5.0  5.0 - 8.0   Glucose, UA NEGATIVE  NEGATIVE mg/dL   Hgb urine dipstick NEGATIVE  NEGATIVE   Bilirubin Urine NEGATIVE  NEGATIVE   Ketones, ur TRACE (*) NEGATIVE mg/dL   Protein, ur NEGATIVE  NEGATIVE mg/dL   Urobilinogen, UA 0.2  0.0  - 1.0 mg/dL   Nitrite NEGATIVE  NEGATIVE   Leukocytes, UA SMALL (*) NEGATIVE  URINE MICROSCOPIC-ADD ON      Component Value Range   Squamous Epithelial / LPF MANY (*) RARE   WBC, UA 0-2  <3 WBC/hpf   Bacteria, UA RARE  RARE  Ct Abdomen Pelvis Wo Contrast  11/19/2011  *RADIOLOGY REPORT*  Clinical Data: 66 year old female with abdominal pelvic pain, nausea and vomiting.  History of hysterectomy  CT ABDOMEN AND PELVIS WITHOUT CONTRAST  Technique:On my exam, at 17:30  Multidetector CT imathe patient is nauseated,ging of the abdomen aAndnd pelvis was performed following  the standard protocol without intravenous contrast.  Comparison: None  Findings: There is a small-moderate amount of ascites within the abdomen and pelvis. There is wall thickening of the majority of the stomach - nonspecific but neoplasm is not excluded. There is omental stranding identified and metastatic disease is not excluded.  The liver, spleen, adrenal glands, kidneys, and pancreas are unremarkable.  Gallstones are noted filling the gallbladder and a 3 mm stone is noted within the cystic duct.  There is no CT evidence of acute cholecystitis.  Please note that parenchymal abnormalities may be missed as intravenous contrast was not administered.  Sigmoid colonic diverticulosis noted without diverticulitis. The bladder is within normal limits.  There is no evidence of biliary dilatation or abdominal aortic aneurysm. The patient is status post hysterectomy.  No acute or suspicious bony abnormalities are noted.  IMPRESSION: Apparent gastric wall thickening - neoplasm is not excluded and further evaluation is recommended.  Small to moderate ascites and omental stranding - nonspecific but metastatic disease is not excluded.  Cholelithiasis and 3 mm cystic duct stone - no CT evidence of acute cholecystitis.  Original Report Authenticated By: Lura Em, M.D.   Dg Abd Acute W/chest  11/19/2011  *RADIOLOGY REPORT*  Clinical Data: Abdominal  pain.  ACUTE ABDOMEN SERIES (ABDOMEN 2 VIEW & CHEST 1 VIEW)  Comparison: 03/17/2011  Findings: Chest radiograph demonstrates clear lungs.  Heart size is upper limits normal but unchanged.  Trachea is midline.  No evidence for free air.  Nonobstructive bowel gas pattern. Gas- filled loop of bowel in the left lower abdomen may be related to colon.  Degenerative changes in the lumbar spine.  There is joint space narrowing in both hips.  IMPRESSION: No acute chest findings.  Nonspecific abdominal bowel gas pattern.  There is one large gas filled bowel loop in the left lower abdomen.  Original Report Authenticated By: Markus Daft, M.D.   On my exam at 17:30-  The pt is nauseated and has localized RUQ tenderness.   Case discussed with Gen. Surg.- They will see in ED  DX: 1- Cholelithiasis  2- Dehydration   3- Renal Insufficiency  4- Hyponatremia   Richarda Blade, MD 11/19/11 1746  She was seen by Dr. Zella Richer. HEENT feels that patient is to be admitted medically, evaluated for oncologic disorders, and does not have an acute gallbladder problem. I discussed the case with the on-call admitting hospitalist. They will admit. She asked me to contact the GI Dr. for a consultation. Dr. Collene Mares will see the patient tomorrow.  Richarda Blade, MD 11/19/11 1850

## 2011-11-20 ENCOUNTER — Inpatient Hospital Stay (HOSPITAL_COMMUNITY): Payer: Medicare Other

## 2011-11-20 DIAGNOSIS — A419 Sepsis, unspecified organism: Secondary | ICD-10-CM

## 2011-11-20 DIAGNOSIS — I517 Cardiomegaly: Secondary | ICD-10-CM

## 2011-11-20 DIAGNOSIS — R609 Edema, unspecified: Secondary | ICD-10-CM

## 2011-11-20 DIAGNOSIS — E871 Hypo-osmolality and hyponatremia: Secondary | ICD-10-CM

## 2011-11-20 DIAGNOSIS — N179 Acute kidney failure, unspecified: Secondary | ICD-10-CM

## 2011-11-20 DIAGNOSIS — I959 Hypotension, unspecified: Secondary | ICD-10-CM

## 2011-11-20 DIAGNOSIS — R1084 Generalized abdominal pain: Secondary | ICD-10-CM

## 2011-11-20 DIAGNOSIS — R55 Syncope and collapse: Secondary | ICD-10-CM | POA: Diagnosis not present

## 2011-11-20 DIAGNOSIS — N189 Chronic kidney disease, unspecified: Secondary | ICD-10-CM

## 2011-11-20 DIAGNOSIS — R112 Nausea with vomiting, unspecified: Secondary | ICD-10-CM

## 2011-11-20 LAB — CARDIAC PANEL(CRET KIN+CKTOT+MB+TROPI)
CK, MB: 4.1 ng/mL — ABNORMAL HIGH (ref 0.3–4.0)
Relative Index: 3.5 — ABNORMAL HIGH (ref 0.0–2.5)
Total CK: 117 U/L (ref 7–177)
Troponin I: <0.3 ng/mL (ref <0.30)

## 2011-11-20 LAB — CBC
HCT: 33.6 % — ABNORMAL LOW (ref 36.0–46.0)
HCT: 34.3 % — ABNORMAL LOW (ref 36.0–46.0)
Hemoglobin: 10.8 g/dL — ABNORMAL LOW (ref 12.0–15.0)
Hemoglobin: 11.1 g/dL — ABNORMAL LOW (ref 12.0–15.0)
MCH: 26 pg (ref 26.0–34.0)
MCH: 26.4 pg (ref 26.0–34.0)
MCHC: 32.1 g/dL (ref 30.0–36.0)
MCHC: 32.4 g/dL (ref 30.0–36.0)
MCV: 81 fL (ref 78.0–100.0)
MCV: 81.5 fL (ref 78.0–100.0)
Platelets: 367 10*3/uL (ref 150–400)
Platelets: 360 10*3/uL (ref 150–400)
RBC: 4.15 MIL/uL (ref 3.87–5.11)
RBC: 4.21 MIL/uL (ref 3.87–5.11)
RDW: 16.2 % — ABNORMAL HIGH (ref 11.5–15.5)
RDW: 16.2 % — ABNORMAL HIGH (ref 11.5–15.5)
WBC: 11.9 10*3/uL — ABNORMAL HIGH (ref 4.0–10.5)
WBC: 15 10*3/uL — ABNORMAL HIGH (ref 4.0–10.5)

## 2011-11-20 LAB — COMPREHENSIVE METABOLIC PANEL
ALT: 10 U/L (ref 0–35)
ALT: 12 U/L (ref 0–35)
AST: 15 U/L (ref 0–37)
AST: 21 U/L (ref 0–37)
Albumin: 2.9 g/dL — ABNORMAL LOW (ref 3.5–5.2)
Albumin: 2.2 g/dL — ABNORMAL LOW (ref 3.5–5.2)
Alkaline Phosphatase: 78 U/L (ref 39–117)
Alkaline Phosphatase: 65 U/L (ref 39–117)
BUN: 36 mg/dL — ABNORMAL HIGH (ref 6–23)
BUN: 37 mg/dL — ABNORMAL HIGH (ref 6–23)
CO2: 20 mEq/L (ref 19–32)
CO2: 19 mEq/L (ref 19–32)
Calcium: 8.9 mg/dL (ref 8.4–10.5)
Calcium: 7.9 mg/dL — ABNORMAL LOW (ref 8.4–10.5)
Chloride: 89 mEq/L — ABNORMAL LOW (ref 96–112)
Chloride: 92 mEq/L — ABNORMAL LOW (ref 96–112)
Creatinine, Ser: 2.58 mg/dL — ABNORMAL HIGH (ref 0.50–1.10)
Creatinine, Ser: 2.86 mg/dL — ABNORMAL HIGH (ref 0.50–1.10)
GFR calc Af Amer: 21 mL/min — ABNORMAL LOW (ref >90)
GFR calc Af Amer: 19 mL/min — ABNORMAL LOW (ref >90)
GFR calc non Af Amer: 18 mL/min — ABNORMAL LOW (ref >90)
GFR calc non Af Amer: 16 mL/min — ABNORMAL LOW (ref >90)
Glucose, Bld: 204 mg/dL — ABNORMAL HIGH (ref 70–99)
Glucose, Bld: 153 mg/dL — ABNORMAL HIGH (ref 70–99)
Potassium: 4.2 mEq/L (ref 3.5–5.1)
Potassium: 5.4 mEq/L — ABNORMAL HIGH (ref 3.5–5.1)
Sodium: 122 mEq/L — ABNORMAL LOW (ref 135–145)
Sodium: 123 mEq/L — ABNORMAL LOW (ref 135–145)
Total Bilirubin: 0.4 mg/dL (ref 0.3–1.2)
Total Bilirubin: 0.3 mg/dL (ref 0.3–1.2)
Total Protein: 6.1 g/dL (ref 6.0–8.3)
Total Protein: 5.1 g/dL — ABNORMAL LOW (ref 6.0–8.3)

## 2011-11-20 LAB — TSH: TSH: 7.171 u[IU]/mL — ABNORMAL HIGH (ref 0.350–4.500)

## 2011-11-20 LAB — PROCALCITONIN: Procalcitonin: 0.86 ng/mL

## 2011-11-20 LAB — BASIC METABOLIC PANEL
BUN: 37 mg/dL — ABNORMAL HIGH (ref 6–23)
CO2: 19 mEq/L (ref 19–32)
Calcium: 8.8 mg/dL (ref 8.4–10.5)
Chloride: 88 mEq/L — ABNORMAL LOW (ref 96–112)
Creatinine, Ser: 3.01 mg/dL — ABNORMAL HIGH (ref 0.50–1.10)
GFR calc Af Amer: 17 mL/min — ABNORMAL LOW (ref >90)
GFR calc non Af Amer: 15 mL/min — ABNORMAL LOW (ref >90)
Glucose, Bld: 237 mg/dL — ABNORMAL HIGH (ref 70–99)
Potassium: 4.5 mEq/L (ref 3.5–5.1)
Sodium: 122 mEq/L — ABNORMAL LOW (ref 135–145)

## 2011-11-20 LAB — BLOOD GAS, ARTERIAL
Acid-base deficit: 7.6 mmol/L — ABNORMAL HIGH (ref 0.0–2.0)
Acid-base deficit: 6 mmol/L — ABNORMAL HIGH (ref 0.0–2.0)
Bicarbonate: 18 mEq/L — ABNORMAL LOW (ref 20.0–24.0)
Bicarbonate: 19.1 mEq/L — ABNORMAL LOW (ref 20.0–24.0)
Drawn by: 340271
FIO2: 1 %
O2 Content: 2 L/min
O2 Saturation: 98.3 %
O2 Saturation: 98.2 %
Patient temperature: 98.6
Patient temperature: 98.6
TCO2: 17 mmol/L (ref 0–100)
TCO2: 18.3 mmol/L (ref 0–100)
pCO2 arterial: 38.9 mmHg (ref 35.0–45.0)
pCO2 arterial: 38.5 mmHg (ref 35.0–45.0)
pH, Arterial: 7.286 — ABNORMAL LOW (ref 7.350–7.400)
pH, Arterial: 7.316 — ABNORMAL LOW (ref 7.350–7.400)
pO2, Arterial: 140 mmHg — ABNORMAL HIGH (ref 80.0–100.0)
pO2, Arterial: 92.7 mmHg (ref 80.0–100.0)

## 2011-11-20 LAB — SODIUM, URINE, RANDOM: Sodium, Ur: 95 mEq/L

## 2011-11-20 LAB — CREATININE, URINE, RANDOM: Creatinine, Urine: 86.7 mg/dL

## 2011-11-20 LAB — GLUCOSE, CAPILLARY
Glucose-Capillary: 184 mg/dL — ABNORMAL HIGH (ref 70–99)
Glucose-Capillary: 203 mg/dL — ABNORMAL HIGH (ref 70–99)
Glucose-Capillary: 224 mg/dL — ABNORMAL HIGH (ref 70–99)
Glucose-Capillary: 235 mg/dL — ABNORMAL HIGH (ref 70–99)
Glucose-Capillary: 207 mg/dL — ABNORMAL HIGH (ref 70–99)
Glucose-Capillary: 152 mg/dL — ABNORMAL HIGH (ref 70–99)
Glucose-Capillary: 141 mg/dL — ABNORMAL HIGH (ref 70–99)

## 2011-11-20 LAB — UREA NITROGEN, URINE: Urea Nitrogen, Ur: 36 mg/dL

## 2011-11-20 LAB — CORTISOL: Cortisol, Plasma: 40.7 ug/dL

## 2011-11-20 LAB — PROTEIN / CREATININE RATIO, URINE
Creatinine, Urine: 74.53 mg/dL
Protein Creatinine Ratio: 0.94 — ABNORMAL HIGH (ref 0.00–0.15)
Total Protein, Urine: 70 mg/dL

## 2011-11-20 LAB — D-DIMER, QUANTITATIVE: D-Dimer, Quant: 19.72 ug/mL-FEU — ABNORMAL HIGH (ref 0.00–0.48)

## 2011-11-20 LAB — CANCER ANTIGEN 19-9: CA 19-9: 5 U/mL — ABNORMAL LOW (ref <35.0)

## 2011-11-20 LAB — MRSA PCR SCREENING: MRSA by PCR: NEGATIVE

## 2011-11-20 LAB — CEA: CEA: <0.5 ng/mL (ref 0.0–5.0)

## 2011-11-20 LAB — HEMOGLOBIN A1C
Hgb A1c MFr Bld: 6.1 % — ABNORMAL HIGH (ref <5.7)
Mean Plasma Glucose: 128 mg/dL — ABNORMAL HIGH (ref <117)

## 2011-11-20 LAB — LACTIC ACID, PLASMA: Lactic Acid, Venous: 0.9 mmol/L (ref 0.5–2.2)

## 2011-11-20 LAB — CA 125: CA 125: 14.9 U/mL (ref 0.0–30.2)

## 2011-11-20 MED ORDER — FLUMAZENIL 0.5 MG/5ML IV SOLN
INTRAVENOUS | Status: AC
  Administered 2011-11-20: 20 mg
  Filled 2011-11-20: qty 5

## 2011-11-20 MED ORDER — SODIUM CHLORIDE 0.9 % IV BOLUS (SEPSIS)
2000.0000 mL | Freq: Once | INTRAVENOUS | Status: AC
  Administered 2011-11-20: 2000 mL via INTRAVENOUS

## 2011-11-20 MED ORDER — SODIUM CHLORIDE 0.9 % IV BOLUS (SEPSIS)
500.0000 mL | Freq: Once | INTRAVENOUS | Status: AC
  Administered 2011-11-20: 500 mL via INTRAVENOUS

## 2011-11-20 MED ORDER — PANTOPRAZOLE SODIUM 40 MG IV SOLR
40.0000 mg | INTRAVENOUS | Status: DC
  Administered 2011-11-20: 40 mg via INTRAVENOUS
  Filled 2011-11-20: qty 40

## 2011-11-20 MED ORDER — SODIUM CHLORIDE 0.9 % IV BOLUS (SEPSIS): 500.0000 mL | Freq: Once | INTRAVENOUS | Status: DC

## 2011-11-20 MED ORDER — IPRATROPIUM BROMIDE 0.02 % IN SOLN
0.5000 mg | RESPIRATORY_TRACT | Status: DC | PRN
  Administered 2011-11-20 (×2): 0.5 mg via RESPIRATORY_TRACT
  Filled 2011-11-20 (×2): qty 2.5

## 2011-11-20 MED ORDER — LEVOTHYROXINE SODIUM 100 MCG IV SOLR
50.0000 ug | Freq: Every day | INTRAVENOUS | Status: DC
  Administered 2011-11-21 – 2011-11-30 (×10): 50 ug via INTRAVENOUS
  Filled 2011-11-20 (×12): qty 2.5

## 2011-11-20 MED ORDER — HEPARIN SODIUM (PORCINE) 5000 UNIT/ML IJ SOLN
5000.0000 [IU] | Freq: Two times a day (BID) | INTRAMUSCULAR | Status: DC
  Administered 2011-11-20 – 2011-11-21 (×2): 5000 [IU] via SUBCUTANEOUS
  Filled 2011-11-20 (×3): qty 1

## 2011-11-20 MED ORDER — DOPAMINE-DEXTROSE 3.2-5 MG/ML-% IV SOLN: 2.0000 ug/kg/min | INTRAVENOUS | Status: DC

## 2011-11-20 MED ORDER — ALBUTEROL SULFATE (5 MG/ML) 0.5% IN NEBU
2.5000 mg | INHALATION_SOLUTION | RESPIRATORY_TRACT | Status: DC | PRN
  Administered 2011-11-20 – 2011-11-21 (×3): 2.5 mg via RESPIRATORY_TRACT
  Filled 2011-11-20 (×3): qty 0.5

## 2011-11-20 MED FILL — Medication: Qty: 1 | Status: AC

## 2011-11-20 NOTE — Progress Notes (Signed)
  Echocardiogram 2D Echocardiogram has been performed.  Amanda Cain 11/20/2011, 9:38 AM

## 2011-11-20 NOTE — Progress Notes (Signed)
Per NT, patient foley inserted. No urine return. Scanned bladder, 74 ml retention. Checked ballon, filled with 10 ml and appears to be properly placed.  Will continue to monitor. Patient does have fluids infusing.

## 2011-11-20 NOTE — Progress Notes (Signed)
PT Cancellation Note  Treatment cancelled today due to medical issues with patient which prohibited therapy.  Noted pt transfer to ICU due to unresponsiveness and received discontinue order.  Please re-order when appropriate.    Thanks,   Page, Betha Loa 11/20/2011, 2:07 PM

## 2011-11-20 NOTE — Progress Notes (Signed)
Notified Dr. Blenda Nicely of Bp, urine output and loc.  Orders received.

## 2011-11-20 NOTE — Progress Notes (Signed)
*  PRELIMINARY RESULTS* Vascular Ultrasound Lower extremity venous duplex has been completed.  Preliminary findings: Bilaterally no evidence of DVT  EUNICE, Jordy Verba RDMS 11/20/2011, 5:36 PM

## 2011-11-20 NOTE — Progress Notes (Signed)
Comment:  [employer] called.   Per RN, patient had just finished using bedside commode, and was being helped back into bed, when she collapsed, and appeared to be having respiratory difficulties.  On my arrival, patient was concious, a little agitated, had NRB on, and was saturating at 100%. HR was 102/min, and SBP was in the 130s. Patient was able to follow simple commands, and denied chest pain.   Chest:  Clinically clear to auscultation. Heart: Sounds 1 and 2 heard, normal, regular, no murmurs.  Abdomen:: No new findings. LE: Unchanged.    Comment:   It appears that patient suffered a syncopal episode, due to orthostatic hypotension on standing up from commode, against a background of low BP this AM. As antihypertensives have all been discontinued, we shall continue ivi fluids, and observe closely in SDU. Other considerations include acur coronary event and PE.  Plan:  1. EKG: this showed SR, ano acute ischemic changes. 2. Portable CXR: Chest is clear to auscultation, Echo shows EF of 60%-65%. 3. CBC, Lytes, D-Dimer, ABGs. 4. CBG>100. 5. Flumazenil 200 mcg iv stat given, as patient had Lorazepam this AM.   Will re-evaluate later today.

## 2011-11-20 NOTE — Progress Notes (Signed)
Notified Dr. Chase Caller of shob.  Dr. Blenda Nicely came to see patient on unit. Neb treatments ordered.

## 2011-11-20 NOTE — Progress Notes (Signed)
Subjective: Having some lower abdominal pain. Nausea is better.  Objective: Vital signs in last 24 hours: Temp:  [97.6 F (36.4 C)-98.8 F (37.1 C)] 97.7 F (36.5 C) (06/23 0715) Pulse Rate:  [77-100] 88  (06/23 0715) Resp:  [16-20] 18  (06/23 0715) BP: (91-128)/(45-85) 91/61 mmHg (06/23 0841) SpO2:  [93 %-98 %] 93 % (06/23 0841) Weight:  [240 lb 6.4 oz (109.045 kg)] 240 lb 6.4 oz (109.045 kg) (06/22 2045) Last BM Date: 11/19/11  Intake/Output from previous day: 06/22 0701 - 06/23 0700 In: 2565 [P.O.:1440; I.V.:1125] Out: 265 [Urine:215; Emesis/NG output:50] Intake/Output this shift:    PE:   Lab Results:   Basename 11/20/11 0531 11/19/11 1155  WBC 11.9* 9.1  HGB 10.8* 10.0*  HCT 33.6* 30.9*  PLT 367 348   BMET  Basename 11/20/11 0531 11/19/11 1155  NA 122* 123*  K 4.2 4.0  CL 89* 86*  CO2 20 26  GLUCOSE 204* 170*  BUN 36* 35*  CREATININE 2.58* 2.59*  CALCIUM 8.9 9.1   PT/INR  Basename 11/19/11 2155  LABPROT 14.6  INR 1.12   Comprehensive Metabolic Panel:    Component Value Date/Time   NA 122* 11/20/2011 0531   K 4.2 11/20/2011 0531   CL 89* 11/20/2011 0531   CO2 20 11/20/2011 0531   BUN 36* 11/20/2011 0531   CREATININE 2.58* 11/20/2011 0531   GLUCOSE 204* 11/20/2011 0531   CALCIUM 8.9 11/20/2011 0531   AST 15 11/20/2011 0531   ALT 10 11/20/2011 0531   ALKPHOS 78 11/20/2011 0531   BILITOT 0.4 11/20/2011 0531   PROT 6.1 11/20/2011 0531   ALBUMIN 2.9* 11/20/2011 0531     Studies/Results: Ct Abdomen Pelvis Wo Contrast  11/19/2011  *RADIOLOGY REPORT*  Clinical Data: 66 year old female with abdominal pelvic pain, nausea and vomiting.  History of hysterectomy  CT ABDOMEN AND PELVIS WITHOUT CONTRAST  Technique:  Multidetector CT imaging of the abdomen and pelvis was performed following the standard protocol without intravenous contrast.  Comparison: None  Findings: There is a small-moderate amount of ascites within the abdomen and pelvis. There is wall  thickening of the majority of the stomach - nonspecific but neoplasm is not excluded. There is omental stranding identified and metastatic disease is not excluded.  The liver, spleen, adrenal glands, kidneys, and pancreas are unremarkable.  Gallstones are noted filling the gallbladder and a 3 mm stone is noted within the cystic duct.  There is no CT evidence of acute cholecystitis.  Please note that parenchymal abnormalities may be missed as intravenous contrast was not administered.  Sigmoid colonic diverticulosis noted without diverticulitis. The bladder is within normal limits.  There is no evidence of biliary dilatation or abdominal aortic aneurysm. The patient is status post hysterectomy.  No acute or suspicious bony abnormalities are noted.  IMPRESSION: Apparent gastric wall thickening - neoplasm is not excluded and further evaluation is recommended.  Small to moderate ascites and omental stranding - nonspecific but metastatic disease is not excluded.  Cholelithiasis and 3 mm cystic duct stone - no CT evidence of acute cholecystitis.  Original Report Authenticated By: Lura Em, M.D.   Dg Abd Acute W/chest  11/19/2011  *RADIOLOGY REPORT*  Clinical Data: Abdominal pain.  ACUTE ABDOMEN SERIES (ABDOMEN 2 VIEW & CHEST 1 VIEW)  Comparison: 03/17/2011  Findings: Chest radiograph demonstrates clear lungs.  Heart size is upper limits normal but unchanged.  Trachea is midline.  No evidence for free air.  Nonobstructive bowel gas pattern. Gas- filled  loop of bowel in the left lower abdomen may be related to colon.  Degenerative changes in the lumbar spine.  There is joint space narrowing in both hips.  IMPRESSION: No acute chest findings.  Nonspecific abdominal bowel gas pattern.  There is one large gas filled bowel loop in the left lower abdomen.  Original Report Authenticated By: Markus Daft, M.D.    Anti-infectives: Anti-infectives    None      Assessment Principal Problem:  Gastric thickening with  ascites and omental thickening concerning for an infiltrating process-on IV Protonix; tumor markers normal Active Problems:  Gallstones- I am not currently convinced that this is main cause of her problem.  Obesity (BMI 30.0-34.9)  Hyponatremia-persists  Anemia  Acute renal insufficiency-no significant change  Moderate protein-calorie malnutrition  Diabetes mellitus      LOS: 1 day   Plan: Await GI consult and EGD.   Detrell Umscheid Lenna Sciara 11/20/2011

## 2011-11-20 NOTE — Progress Notes (Signed)
11-20-11  0500: pt demanding to have a diet ordered.  Kathline Magic on call and notified.  Pt is alert and oriented and verbalizes understanding for md orders of NPO but wishes to disregard them and also acknowledges that she will probably not be able to have certain tests [id number] etc b/c she has choosen to eat and drink.  Kathline Magic np orders an ADA diet and pt has been given a snack of cheerios/milk.  Her cbg prior to this was184.
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
## 2011-11-20 NOTE — Progress Notes (Signed)
Went in room to help patient back to bed from bedside commode.  Patient having difficulty breathing.  Raised the head of the bed and placed patient on pulse ox.  SAT 02 =69% and HR =55.  Patient became unresponsive and called RR.  Ambu bag placed and patient became alert to voice.  Md aware, and patient being transferred off floor for closer monitoring.

## 2011-11-20 NOTE — Progress Notes (Signed)
11-20-14 0530 pt has been npo since MN - much education has been provided as to rationale and order.  Mouth care has been provided.  Pt verbalizes understanding of reason and order for npo but demands to have something to drink.  Have given her a full glass of ice water.

## 2011-11-20 NOTE — Progress Notes (Signed)
Subjective: Events of early AM noted. Per patient, she vomited twice, since admission.  Objective: Vital signs in last 24 hours: Temp:  [97.6 F (36.4 C)-98.8 F (37.1 C)] 97.7 F (36.5 C) (06/23 0715) Pulse Rate:  [77-100] 88  (06/23 0715) Resp:  [16-20] 18  (06/23 0715) BP: (91-128)/(45-85) 101/69 mmHg (06/23 0715) SpO2:  [93 %-98 %] 98 % (06/23 0715) Weight:  [109.045 kg (240 lb 6.4 oz)] 109.045 kg (240 lb 6.4 oz) (06/22 2045) Weight change:  Last BM Date: 11/19/11  Intake/Output from previous day: 06/22 0701 - 06/23 0700 In: 2565 [P.O.:1440; I.V.:1125] Out: 265 [Urine:215; Emesis/NG output:50]     Physical Exam: General: Alert, communicative, fully oriented, not short of breath at rest. Looks anasarcic. HEENT:  Mild clinical pallor, no jaundice, no conjunctival injection or discharge. Buccal mucosa is moist.  NECK:  Supple, JVP not seen, no carotid bruits, no palpable lymphadenopathy, no palpable goiter. CHEST:  Clinically clear to auscultation, no wheezes, no crackles. HEART:  Sounds 1 and 2 heard, normal, regular, no murmurs. ABDOMEN:  Morbidly obese, softly distended, diffusely tender in upper abdomen, unable to palpate organs, bowel sounds heard. GENITALIA:  Not examined. LOWER EXTREMITIES:  Moderate pitting edema, palpable peripheral pulses. MUSCULOSKELETAL SYSTEM:  Generalized osteoarthritic changes, otherwise, normal. CENTRAL NERVOUS SYSTEM:  No focal neurologic deficit on gross examination.  Lab Results:  Basename 11/20/11 0531 11/19/11 1155  WBC 11.9* 9.1  HGB 10.8* 10.0*  HCT 33.6* 30.9*  PLT 367 348    Basename 11/20/11 0531 11/19/11 1155  NA 122* 123*  K 4.2 4.0  CL 89* 86*  CO2 20 26  GLUCOSE 204* 170*  BUN 36* 35*  CREATININE 2.58* 2.59*  CALCIUM 8.9 9.1   No results found for this or any previous visit (from the past 240 hour(s)).   Studies/Results: Ct Abdomen Pelvis Wo Contrast  11/19/2011  *RADIOLOGY REPORT*  Clinical Data: 66 year old  female with abdominal pelvic pain, nausea and vomiting.  History of hysterectomy  CT ABDOMEN AND PELVIS WITHOUT CONTRAST  Technique:  Multidetector CT imaging of the abdomen and pelvis was performed following the standard protocol without intravenous contrast.  Comparison: None  Findings: There is a small-moderate amount of ascites within the abdomen and pelvis. There is wall thickening of the majority of the stomach - nonspecific but neoplasm is not excluded. There is omental stranding identified and metastatic disease is not excluded.  The liver, spleen, adrenal glands, kidneys, and pancreas are unremarkable.  Gallstones are noted filling the gallbladder and a 3 mm stone is noted within the cystic duct.  There is no CT evidence of acute cholecystitis.  Please note that parenchymal abnormalities may be missed as intravenous contrast was not administered.  Sigmoid colonic diverticulosis noted without diverticulitis. The bladder is within normal limits.  There is no evidence of biliary dilatation or abdominal aortic aneurysm. The patient is status post hysterectomy.  No acute or suspicious bony abnormalities are noted.  IMPRESSION: Apparent gastric wall thickening - neoplasm is not excluded and further evaluation is recommended.  Small to moderate ascites and omental stranding - nonspecific but metastatic disease is not excluded.  Cholelithiasis and 3 mm cystic duct stone - no CT evidence of acute cholecystitis.  Original Report Authenticated By: Lura Em, M.D.   Dg Abd Acute W/chest  11/19/2011  *RADIOLOGY REPORT*  Clinical Data: Abdominal pain.  ACUTE ABDOMEN SERIES (ABDOMEN 2 VIEW & CHEST 1 VIEW)  Comparison: 03/17/2011  Findings: Chest radiograph demonstrates clear lungs.  Heart size is upper limits normal but unchanged.  Trachea is midline.  No evidence for free air.  Nonobstructive bowel gas pattern. Gas- filled loop of bowel in the left lower abdomen may be related to colon.  Degenerative changes in  the lumbar spine.  There is joint space narrowing in both hips.  IMPRESSION: No acute chest findings.  Nonspecific abdominal bowel gas pattern.  There is one large gas filled bowel loop in the left lower abdomen.  Original Report Authenticated By: Markus Daft, M.D.    Medications: Scheduled Meds:   . sodium chloride   Intravenous Once  . sodium chloride   Intravenous STAT  . atorvastatin  40 mg Oral q1800  . calcium-vitamin D  1 tablet Oral BID  . enoxaparin  30 mg Subcutaneous Q24H  . escitalopram  20 mg Oral QHS  . felodipine  5 mg Oral q morning - 10a  . ferrous sulfate  325 mg Oral Q breakfast  . furosemide  40 mg Oral Daily  . insulin aspart  0-15 Units Subcutaneous TID WC  . insulin aspart  0-5 Units Subcutaneous QHS  . levothyroxine  100 mcg Oral QAC breakfast  . LORazepam  0.5 mg Oral BID  . metoprolol  200 mg Oral QAC breakfast  . multivitamin with minerals  1 tablet Oral Daily  . ondansetron  4 mg Intravenous Once  . pantoprazole  40 mg Oral Q1200  . sodium chloride  500 mL Intravenous Once  . sodium chloride  3 mL Intravenous Q12H  . vitamin B-12  1,000 mcg Oral Daily  . vitamin C  500 mg Oral Daily  . vitamin E  400 Units Oral Daily  . zolpidem  10 mg Oral Once  . DISCONTD: Calcium Carbonate-Vitamin D  1 tablet Oral BID   Continuous Infusions:   . sodium chloride 125 mL/hr at 11/19/11 1901   PRN Meds:.HYDROcodone-acetaminophen, morphine injection, ondansetron (ZOFRAN) IV, ondansetron  Assessment/Plan:  Active Problems:   1. Abdominal pain, nausea and vomiting:   These were patient's presenting complaints, and had developed over a couple of days.  Abdomen/pelvis CT demonstrated apparent gastric wall thickening, small to moderate ascites and omental stranding, cholelithiasis and 3 mm cystic duct stone, without evidence of acute cholecystitis. Dr Jackolyn Confer provided surgical consultation, and feels that cystic duct stone is not contributory to patient's symptoms.  Patient is being managed with bowel rest, PPI, antiemetics and iv fluids. GI has been consulted for possible endoscopic evaluation, and tumor markers, i.e, CA 19-9, CA 125 and CEA are un-elevated. Lipase is normal at 18, as are LFT's. 2. Acute kidney injury (ARF):   Creatinine at presentation, was 2.59, BUN 35, consistent with ARF. This is due to dehydration, secondary to vomiting, against a background of poor oral intake, diuretic and ARB therapy. Culprit medications have been discontinued, and Metformin placed on hold. Managing with iv fluids, and improvement is anticipated, although baseline creatinine is unknown at this time, and there may be an element of chronicity. Because of anasarca, and anticipated difficulties in managing fluid status, will consult renal, and do 2D echocardiogram. ProBNP is fortunately, only 130.6. CXR showed clear lungs. 3. Hyponatremia:  Sodium was 123 at presentation. Etiology is unclear, although diuretic therapy may be contributory, and now exacerbated by GI losses. Of note sodium was 129 in 07/2011, suggesting chronicity. SIADH is in the differentials, on the basis of possible malignant disease, but checking TSH and cortisol levels. Meanwhile, managing with saline infusion. 4. Diabetes  mellitus:   Reasonably, but sub-optimally controlled, based on CBGs. As described above, Metformin is on hold, but managing with diet and SSI. HBA1c is pending.  5. HTN (hypertension)  BP appears borderline to low at this time, on Felodipine and Metoprolol. Benicar was discontinued, due to ARF. We shall place antihypertensives on hold, till BP improves.  6. Hypothyroidism: Continued on thyroxine replacement therapy. TSH is pending. 7. Dyslipidemia: On Statin.  8. Anemia:  This is normocytic, mild, and multi-factorial, secondary to iron and B12 deficiency. Continue pre-admission supplements.   9. Moderate protein-calorie malnutrition: Patient appears clinically malnourished, although  obese, and albumin is low at 2.9. Nutrition consult requested.   LOS: 1 day   Harlym Gehling,CHRISTOPHER 11/20/2011, 8:31 AM

## 2011-11-20 NOTE — Progress Notes (Addendum)
eLink Physician-Brief Progress Note Patient Name: Amanda Cain DOB: 01-30-1946 MRN: [id number]  Date of Service  11/20/2011   HPI/Events of Note  Dr Blenda Nicely calling for consult   - patient admitted with ATN  - today on floor near syncopal episode v true syncope with spont return of alertness and orientation per him. BP low after that and repeat labs show rising wbc and falling ur op and increasing creatinine  - spoke to ICU RN  - currently HR 76, RASS appears -2 on caerma, sbp 94-85, RR 15     eICU Interventions  PLAN at 11/20/2011 , 4:29 PM   Place another PIV  2L fluid bolus Check stat lactate and pct  neosynephrine depending on bp response  recheck abg   UPDATE 6:25 PM   - bp map now consistently > 75 after 1L bolus (2nd going in)  =- abg improved PH to 7.31  - lactate normal PLAN  - continue to monitor   Intervention Category Major Interventions: Acute renal failure - evaluation and management;Hypotension - evaluation and management;Hypovolemia - evaluation and treatment with fluids;Change in mental status - evaluation and management Intermediate Interventions: Communication with other healthcare providers and/or family  Tyronn Golda 11/20/2011, 4:26 PM

## 2011-11-20 NOTE — Progress Notes (Signed)
11-19-11 0500:  Pt was given 10 mg of ativan at ooo1 as ordered and it made her slightly confused and irritable.  VSS. Bed/chair alarm are on.
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
## 2011-11-20 NOTE — Progress Notes (Signed)
11-20-11 NSG: Pt iv has been pulled out. Ivt in to replace iv at 0715 and noted pt who is sitting up in chair is cold, clammy and diaphoretic. Her CBG is 203 currently. Has just finished eating a snack of cherrios and milk per her request. Denies pain. No nausea. Is alert and oriented at this time. bp at 0715 is 91/63, HR 92, sat is 94% on room air, rr is 18. Telemetry monitor reports there have been no changes in her telemetry since admission and remains SR in the 80's and 90's. IVT replaces the ivt and ivf are restarted. bp is now 101/69, hr is 88, o2 sats are 98% on ra, rr is 18, and temp is 97.7. Pt is not longer cold, clammy or diaphoretic and is requesting to go back to bed. Pt is able to move with a steadier gait than 2 hours ago. Labs reviewed. Breakfast ordered. Pt instructed to notify rn if she feels like this again or feels any changes. Verbalizes understanding. Report given to oncoming rn dawn.
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
## 2011-11-21 ENCOUNTER — Inpatient Hospital Stay (HOSPITAL_COMMUNITY): Payer: Medicare Other

## 2011-11-21 DIAGNOSIS — R6521 Severe sepsis with septic shock: Secondary | ICD-10-CM | POA: Diagnosis not present

## 2011-11-21 DIAGNOSIS — R55 Syncope and collapse: Secondary | ICD-10-CM

## 2011-11-21 DIAGNOSIS — R579 Shock, unspecified: Secondary | ICD-10-CM

## 2011-11-21 DIAGNOSIS — A419 Sepsis, unspecified organism: Principal | ICD-10-CM | POA: Diagnosis not present

## 2011-11-21 DIAGNOSIS — I959 Hypotension, unspecified: Secondary | ICD-10-CM

## 2011-11-21 DIAGNOSIS — N179 Acute kidney failure, unspecified: Secondary | ICD-10-CM

## 2011-11-21 LAB — CBC
HCT: 30 % — ABNORMAL LOW (ref 36.0–46.0)
Hemoglobin: 9.8 g/dL — ABNORMAL LOW (ref 12.0–15.0)
MCH: 26.6 pg (ref 26.0–34.0)
MCHC: 32.7 g/dL (ref 30.0–36.0)
MCV: 81.5 fL (ref 78.0–100.0)
Platelets: 316 10*3/uL (ref 150–400)
RBC: 3.68 MIL/uL — ABNORMAL LOW (ref 3.87–5.11)
RDW: 16.2 % — ABNORMAL HIGH (ref 11.5–15.5)
WBC: 10.9 10*3/uL — ABNORMAL HIGH (ref 4.0–10.5)

## 2011-11-21 LAB — BLOOD GAS, ARTERIAL
Acid-base deficit: 8.1 mmol/L — ABNORMAL HIGH (ref 0.0–2.0)
Bicarbonate: 16.2 mEq/L — ABNORMAL LOW (ref 20.0–24.0)
Drawn by: 296031
O2 Content: 4 L/min
O2 Saturation: 99.2 %
Patient temperature: 98.6
TCO2: 15.4 mmol/L (ref 0–100)
pCO2 arterial: 30.3 mmHg — ABNORMAL LOW (ref 35.0–45.0)
pH, Arterial: 7.348 — ABNORMAL LOW (ref 7.350–7.400)
pO2, Arterial: 128 mmHg — ABNORMAL HIGH (ref 80.0–100.0)

## 2011-11-21 LAB — LACTIC ACID, PLASMA: Lactic Acid, Venous: 1 mmol/L (ref 0.5–2.2)

## 2011-11-21 LAB — SODIUM, URINE, RANDOM: Sodium, Ur: 17 mEq/L

## 2011-11-21 LAB — RENAL FUNCTION PANEL
Albumin: 2.2 g/dL — ABNORMAL LOW (ref 3.5–5.2)
BUN: 39 mg/dL — ABNORMAL HIGH (ref 6–23)
CO2: 20 mEq/L (ref 19–32)
Calcium: 7.8 mg/dL — ABNORMAL LOW (ref 8.4–10.5)
Chloride: 93 mEq/L — ABNORMAL LOW (ref 96–112)
Creatinine, Ser: 2.81 mg/dL — ABNORMAL HIGH (ref 0.50–1.10)
GFR calc Af Amer: 19 mL/min — ABNORMAL LOW (ref >90)
GFR calc non Af Amer: 16 mL/min — ABNORMAL LOW (ref >90)
Glucose, Bld: 140 mg/dL — ABNORMAL HIGH (ref 70–99)
Phosphorus: 3.7 mg/dL (ref 2.3–4.6)
Potassium: 5.3 mEq/L — ABNORMAL HIGH (ref 3.5–5.1)
Sodium: 123 mEq/L — ABNORMAL LOW (ref 135–145)

## 2011-11-21 LAB — BASIC METABOLIC PANEL
BUN: 40 mg/dL — ABNORMAL HIGH (ref 6–23)
CO2: 18 mEq/L — ABNORMAL LOW (ref 19–32)
Calcium: 7.8 mg/dL — ABNORMAL LOW (ref 8.4–10.5)
Chloride: 92 mEq/L — ABNORMAL LOW (ref 96–112)
Creatinine, Ser: 2.53 mg/dL — ABNORMAL HIGH (ref 0.50–1.10)
GFR calc Af Amer: 21 mL/min — ABNORMAL LOW (ref >90)
GFR calc non Af Amer: 19 mL/min — ABNORMAL LOW (ref >90)
Glucose, Bld: 173 mg/dL — ABNORMAL HIGH (ref 70–99)
Potassium: 4.5 mEq/L (ref 3.5–5.1)
Sodium: 122 mEq/L — ABNORMAL LOW (ref 135–145)

## 2011-11-21 LAB — BODY FLUID CELL COUNT WITH DIFFERENTIAL
Lymphs, Fluid: 12 %
Monocyte-Macrophage-Serous Fluid: 2 % — ABNORMAL LOW (ref 50–90)
Neutrophil Count, Fluid: 85 % — ABNORMAL HIGH (ref 0–25)
Total Nucleated Cell Count, Fluid: 9446 cu mm — ABNORMAL HIGH (ref 0–1000)

## 2011-11-21 LAB — GLUCOSE, CAPILLARY
Glucose-Capillary: 146 mg/dL — ABNORMAL HIGH (ref 70–99)
Glucose-Capillary: 152 mg/dL — ABNORMAL HIGH (ref 70–99)
Glucose-Capillary: 159 mg/dL — ABNORMAL HIGH (ref 70–99)
Glucose-Capillary: 162 mg/dL — ABNORMAL HIGH (ref 70–99)
Glucose-Capillary: 166 mg/dL — ABNORMAL HIGH (ref 70–99)
Glucose-Capillary: 173 mg/dL — ABNORMAL HIGH (ref 70–99)

## 2011-11-21 LAB — MPO/PR-3 (ANCA) ANTIBODIES
Myeloperoxidase Abs: <1 AU/mL (ref <20)
Serine Protease 3: 1 AU/mL (ref <20)

## 2011-11-21 LAB — KAPPA/LAMBDA LIGHT CHAINS
Kappa free light chain: 3.23 mg/dL — ABNORMAL HIGH (ref 0.33–1.94)
Kappa, lambda light chain ratio: 3.85 — ABNORMAL HIGH (ref 0.26–1.65)
Lambda free light chains: 0.84 mg/dL (ref 0.57–2.63)

## 2011-11-21 LAB — ANA: Anti Nuclear Antibody(ANA): NEGATIVE

## 2011-11-21 LAB — CREATININE, URINE, RANDOM: Creatinine, Urine: 376.6 mg/dL

## 2011-11-21 LAB — PROCALCITONIN: Procalcitonin: 1.06 ng/mL

## 2011-11-21 MED ORDER — SODIUM POLYSTYRENE SULFONATE 15 GM/60ML PO SUSP
15.0000 g | Freq: Once | ORAL | Status: AC
  Administered 2011-11-21: 15 g via ORAL
  Filled 2011-11-21: qty 60

## 2011-11-21 MED ORDER — DEXTROSE 5 % IV SOLN
1.0000 g | INTRAVENOUS | Status: DC
  Administered 2011-11-21 – 2011-11-30 (×10): 1 g via INTRAVENOUS
  Filled 2011-11-21 (×10): qty 10

## 2011-11-21 MED ORDER — METRONIDAZOLE IN NACL 5-0.79 MG/ML-% IV SOLN
500.0000 mg | Freq: Three times a day (TID) | INTRAVENOUS | Status: DC
  Administered 2011-11-21 – 2011-11-30 (×28): 500 mg via INTRAVENOUS
  Filled 2011-11-21 (×33): qty 100

## 2011-11-21 MED ORDER — PHENYLEPHRINE HCL 10 MG/ML IJ SOLN
30.0000 ug/min | INTRAVENOUS | Status: DC
  Administered 2011-11-21: 30 ug/min via INTRAVENOUS
  Filled 2011-11-21: qty 1

## 2011-11-21 MED ORDER — SODIUM CHLORIDE 0.9 % IV BOLUS (SEPSIS): 500.0000 mL | Freq: Once | INTRAVENOUS | Status: DC

## 2011-11-21 MED ORDER — INSULIN ASPART 100 UNIT/ML ~~LOC~~ SOLN
0.0000 [IU] | SUBCUTANEOUS | Status: DC
  Administered 2011-11-21 – 2011-11-22 (×6): 3 [IU] via SUBCUTANEOUS
  Administered 2011-11-23: 22:00:00 via SUBCUTANEOUS
  Administered 2011-11-23 – 2011-11-24 (×7): 3 [IU] via SUBCUTANEOUS
  Administered 2011-11-24: 2 [IU] via SUBCUTANEOUS
  Administered 2011-11-24 – 2011-11-25 (×5): 3 [IU] via SUBCUTANEOUS

## 2011-11-21 MED ORDER — LEVALBUTEROL HCL 0.63 MG/3ML IN NEBU
0.6300 mg | INHALATION_SOLUTION | Freq: Four times a day (QID) | RESPIRATORY_TRACT | Status: DC
  Administered 2011-11-21 – 2011-11-28 (×29): 0.63 mg via RESPIRATORY_TRACT
  Filled 2011-11-21 (×34): qty 3

## 2011-11-21 MED ORDER — PHENYLEPHRINE HCL 10 MG/ML IJ SOLN
30.0000 ug/min | INTRAVENOUS | Status: DC
  Administered 2011-11-21 – 2011-11-22 (×2): 200 ug/min via INTRAVENOUS
  Administered 2011-11-22 (×2): 150 ug/min via INTRAVENOUS
  Filled 2011-11-21 (×4): qty 4

## 2011-11-21 MED ORDER — TECHNETIUM TO 99M ALBUMIN AGGREGATED
3.4000 | Freq: Once | INTRAVENOUS | Status: AC | PRN
  Administered 2011-11-21: 3.4 via INTRAVENOUS

## 2011-11-21 MED ORDER — PHENYLEPHRINE HCL 10 MG/ML IJ SOLN
30.0000 ug/min | INTRAVENOUS | Status: DC
  Administered 2011-11-21 (×3): 100 ug/min via INTRAVENOUS
  Administered 2011-11-21: 200 ug/min via INTRAVENOUS
  Filled 2011-11-21 (×5): qty 2

## 2011-11-21 NOTE — Progress Notes (Signed)
Nutrition Note  Pt with possible sepsis of abdominal origin, ARF/hyperkaleimia,/hyponatremia, milk hypoxemia  NPO except for meds  Spoke with pt and sister.  Pt ate well up until 1 week ago.  Decreased intake with illness.  Diabetic but did not follow diet closely.   CMP     Component Value Date/Time   NA 123* 11/21/2011 0145   K 5.3* 11/21/2011 0145   CL 93* 11/21/2011 0145   CO2 20 11/21/2011 0145   GLUCOSE 140* 11/21/2011 0145   BUN 39* 11/21/2011 0145   CREATININE 2.81* 11/21/2011 0145   CALCIUM 7.8* 11/21/2011 0145   PROT 5.1* 11/20/2011 2240   ALBUMIN 2.2* 11/21/2011 0145   AST 21 11/20/2011 2240   ALT 12 11/20/2011 2240   ALKPHOS 65 11/20/2011 2240   BILITOT 0.3 11/20/2011 2240   GFRNONAA 16* 11/21/2011 0145   GFRAA 19* 11/21/2011 0145    I/O last 3 completed shifts: In: 7729.9 [P.O.:840; I.V.:5622.9; Other:115; IV Piggyback:1152] Out: 421 [Urine:420; Stool:1] Total I/O In: 1508.9 [I.V.:1408.9; IV Piggyback:100] Out: 75 [Urine:75]   Now with ARF with decreased urine output.    Pt wants diet restarted.    Nutrition Diagnosis:  Inadequate oral intake--persists Goal  Intake >75% meals.  Intervention:   Diet advancement per MD Education on DM diet when appropriate Recommend this as an outpt.  Darrol Jump [phone]
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
## 2011-11-21 NOTE — Progress Notes (Signed)
ANTIBIOTIC CONSULT NOTE - INITIAL  Pharmacy Consult for Ceftriaxone Indication: Abdominal infection  No Known Allergies  Patient Measurements: Height: 4\' 11"  (149.9 cm) Weight: 245 lb 6 oz (111.3 kg) IBW/kg (Calculated) : 43.2    Vital Signs: Temp: 98 F (36.7 C) (06/23 2000) Temp src: Oral (06/23 2000) BP: 103/69 mmHg (06/23 2300) Pulse Rate: 85  (06/23 2300) Intake/Output from previous day: 06/23 0701 - 06/24 0700 In: 4057.4 [P.O.:120; I.V.:2840.4; IV Piggyback:1002] Out: 121 [Urine:120; Stool:1] Intake/Output from this shift: Total I/O In: 1030 [I.V.:950; Other:80] Out: -   Labs:  Basename 11/20/11 2240 11/20/11 1454 11/20/11 1228 11/20/11 0531 11/19/11 1155  WBC -- -- 15.0* 11.9* 9.1  HGB -- -- 11.1* 10.8* 10.0*  PLT -- -- 360 367 348  LABCREA -- 86.774.53 -- -- --  CREATININE 2.86* -- 3.01* 2.58* --   Estimated Creatinine Clearance: 21.2 ml/min (by C-G formula based on Cr of 2.86). No results found for this basename: VANCOTROUGH:2,VANCOPEAK:2,VANCORANDOM:2,GENTTROUGH:2,GENTPEAK:2,GENTRANDOM:2,TOBRATROUGH:2,TOBRAPEAK:2,TOBRARND:2,AMIKACINPEAK:2,AMIKACINTROU:2,AMIKACIN:2, in the last 72 hours   Microbiology: Recent Results (from the past 720 hour(s))  MRSA PCR SCREENING     Status: Normal   Collection Time   11/20/11  2:00 PM      Component Value Range Status Comment   MRSA by PCR NEGATIVE  NEGATIVE Final     Medical History: Past Medical History  Diagnosis Date  . Coronary artery disease   . GERD (gastroesophageal reflux disease)   . Depression   . Anxiety   . Hypothyroidism   . Diabetes mellitus   . Anemia   . Hypertension     Medications:  Scheduled:    . escitalopram  20 mg Oral QHS  . flumazenil      . heparin subcutaneous  5,000 Units Subcutaneous Q12H  . insulin aspart  0-15 Units Subcutaneous TID WC  . insulin aspart  0-5 Units Subcutaneous QHS  . levothyroxine  50 mcg Intravenous Q breakfast  . metronidazole  500 mg Intravenous Q8H    . sodium chloride  2,000 mL Intravenous Once  . sodium chloride  500 mL Intravenous Once  . sodium chloride  500 mL Intravenous Once  . sodium chloride  3 mL Intravenous Q12H  . DISCONTD: sodium chloride   Intravenous STAT  . DISCONTD: atorvastatin  40 mg Oral q1800  . DISCONTD: calcium-vitamin D  1 tablet Oral BID  . DISCONTD: enoxaparin  30 mg Subcutaneous Q24H  . DISCONTD: felodipine  5 mg Oral q morning - 10a  . DISCONTD: ferrous sulfate  325 mg Oral Q breakfast  . DISCONTD: furosemide  40 mg Oral Daily  . DISCONTD: levothyroxine  100 mcg Oral QAC breakfast  . DISCONTD: LORazepam  0.5 mg Oral BID  . DISCONTD: metoprolol  200 mg Oral QAC breakfast  . DISCONTD: multivitamin with minerals  1 tablet Oral Daily  . DISCONTD: pantoprazole  40 mg Oral Q1200  . DISCONTD: pantoprazole (PROTONIX) IV  40 mg Intravenous Q24H  . DISCONTD: vitamin B-12  1,000 mcg Oral Daily  . DISCONTD: vitamin C  500 mg Oral Daily  . DISCONTD: vitamin E  400 Units Oral Daily   Infusions:    . sodium chloride 1,000 mL (11/20/11 1738)  . DISCONTD: DOPamine     Assessment:  66 year old female with initial complaint of abdominal pain  CT shows gastric wall thickening - questionable carcinoma  Acute renal failure - CrCl ~ 21 ml/min  Pharmacy asked to dose Rocephin for abdominal infection.  Patient also receiving  Flagyl 500mg  IV q8h  Goal of Therapy:  Eradication of infection  Plan:   Rocephin 1gm IV q24h (no dosage adjustment necessary for renal dysfunction)  Will sign-off.  Please contact pharmacy if further assistance needed.  Thank you, Leone Haven, PharmD 11/21/2011,12:30 AM

## 2011-11-21 NOTE — Progress Notes (Signed)
Subjective: Since I last evaluated the patient, she has had an episode of syncope. Transferred to the [facility] worked up for sepsis and hypotension.  Objective: Vital signs in last 24 hours: Temp:  [96.8 F (36 C)-98 F (36.7 C)] 96.8 F (36 C) (06/24 0000) Pulse Rate:  [44-130] 89  (06/24 0500) Resp:  [15-30] 24  (06/24 0500) BP: (78-153)/(38-83) 98/52 mmHg (06/24 0500) SpO2:  [92 %-100 %] 99 % (06/24 0500) Weight:  [111.3 kg (245 lb 6 oz)-115.2 kg (253 lb 15.5 oz)] 115.2 kg (253 lb 15.5 oz) (06/24 0000) Last BM Date: 11/19/11  Intake/Output from previous day: 06/23 0701 - 06/24 0700 In: 5559.9 [P.O.:120; I.V.:4122.9; IV Piggyback:1152] Out: 121 [Urine:120; Stool:1] Intake/Output this shift: Total I/O In: 2532.5 [I.V.:2232.5; Other:150; IV Piggyback:150] Out: -   General appearance: cooperative, appears stated age and morbidly obese,   Resp: labored breathing, wheezing bilaterally Cardio: regular rate and rhythm, S1, S2 normal, no murmur, click, rub or gallop GI: soft, morbidly obese with non-tender abdomen and hypoactive bowel sounds no masses,  no organomegaly Extremities: atraumatic, no cyanosis; edema noted bilaterally  Lab Results:  Basename 11/21/11 0145 11/20/11 1228 11/20/11 0531  WBC 10.9* 15.0* 11.9*  HGB 9.8* 11.1* 10.8*  HCT 30.0* 34.3* 33.6*  PLT 316 360 367   BMET  Basename 11/21/11 0145 11/20/11 2240 11/20/11 1228  NA 123* 123* 122*  K 5.3* 5.4* 4.5  CL 93* 92* 88*  CO2 20 19 19   GLUCOSE 140* 153* 237*  BUN 39* 37* 37*  CREATININE 2.81* 2.86* 3.01*  CALCIUM 7.8* 7.9* 8.8   LFT  Basename 11/21/11 0145 11/20/11 2240  PROT -- 5.1*  ALBUMIN 2.2* --  AST -- 21  ALT -- 12  ALKPHOS -- 65  BILITOT -- 0.3  BILIDIR -- --  IBILI -- --   PT/INR  Basename 11/19/11 2155  LABPROT 14.6  INR 1.12    Studies/Results: Ct Abdomen Pelvis Wo Contrast  11/19/2011  *RADIOLOGY REPORT*  Clinical Data: 66 year old female with abdominal pelvic pain, nausea  and vomiting.  History of hysterectomy  CT ABDOMEN AND PELVIS WITHOUT CONTRAST  Technique:  Multidetector CT imaging of the abdomen and pelvis was performed following the standard protocol without intravenous contrast.  Comparison: None  Findings: There is a small-moderate amount of ascites within the abdomen and pelvis. There is wall thickening of the majority of the stomach - nonspecific but neoplasm is not excluded. There is omental stranding identified and metastatic disease is not excluded.  The liver, spleen, adrenal glands, kidneys, and pancreas are unremarkable.  Gallstones are noted filling the gallbladder and a 3 mm stone is noted within the cystic duct.  There is no CT evidence of acute cholecystitis.  Please note that parenchymal abnormalities may be missed as intravenous contrast was not administered.  Sigmoid colonic diverticulosis noted without diverticulitis. The bladder is within normal limits.  There is no evidence of biliary dilatation or abdominal aortic aneurysm. The patient is status post hysterectomy.  No acute or suspicious bony abnormalities are noted.  IMPRESSION: Apparent gastric wall thickening - neoplasm is not excluded and further evaluation is recommended.  Small to moderate ascites and omental stranding - nonspecific but metastatic disease is not excluded.  Cholelithiasis and 3 mm cystic duct stone - no CT evidence of acute cholecystitis.  Original Report Authenticated By: Lura Em, M.D.   US Renal Port  11/20/2011  *RADIOLOGY REPORT*  Clinical Data: Acute renal failure.  History diabetes hypertension.  RENAL/URINARY  TRACT ULTRASOUND COMPLETE  Comparison:  Unenhanced CT abdomen and pelvis yesterday.  Findings:  Right Kidney:  No hydronephrosis.  Well-preserved cortex.  No shadowing calculi.  Normal size and parenchymal echotexture without focal abnormalities.  Approximately 10.7 cm in length.  Left Kidney:  Suboptimally imaged due to body habitus. No hydronephrosis.   Well-preserved cortex.  No shadowing calculi. Normal size and parenchymal echotexture without focal abnormalities.  Approximately 11.0 cm in length.  Bladder:  Decompressed by Foley catheter.  IMPRESSION: Normal examination with a caveat that the left kidney was not optimally imaged due to body habitus; however, the left kidney was normal in appearance on the unenhanced CT yesterday.  Original Report Authenticated By: Deniece Portela, M.D.   Dg Chest Port 1 View  11/20/2011  *RADIOLOGY REPORT*  Clinical Data: Syncope  PORTABLE CHEST - 1 VIEW  Comparison: 03/17/2011  Findings: Low volumes.  Moderate cardiomegaly.  Bibasilar atelectasis.  No pneumothorax. No edema.  IMPRESSION: Bibasilar atelectasis.  Original Report Authenticated By: Jamas Lav, M.D.   Dg Abd Acute W/chest  11/19/2011  *RADIOLOGY REPORT*  Clinical Data: Abdominal pain.  ACUTE ABDOMEN SERIES (ABDOMEN 2 VIEW & CHEST 1 VIEW)  Comparison: 03/17/2011  Findings: Chest radiograph demonstrates clear lungs.  Heart size is upper limits normal but unchanged.  Trachea is midline.  No evidence for free air.  Nonobstructive bowel gas pattern. Gas- filled loop of bowel in the left lower abdomen may be related to colon.  Degenerative changes in the lumbar spine.  There is joint space narrowing in both hips.  IMPRESSION: No acute chest findings.  Nonspecific abdominal bowel gas pattern.  There is one large gas filled bowel loop in the left lower abdomen.  Original Report Authenticated By: Markus Daft, M.D.    Medications: I have reviewed the patient's current medications.  Assessment/Plan: 1) Iron deficiency anemia/ abnormal CT scan of the abdomen ?thickening of the gastric wall with omental stranding, rule out gastric cancer. Will wait till the patients acute problems improve before planning an EGD. 2) Acute renal failure secondary to hypotension. 3) Cholelithaisis with a cystic duct stone.  LOS: 2 days   Erven Ramson 11/21/2011, 6:10 AM

## 2011-11-21 NOTE — Progress Notes (Signed)
Patient ID: Amanda Cain, female   DOB: 1945/08/20, 66 y.o.   MRN: [id number] The patient has gotten more acidotic as the day has progressed. There is concern that her abdomen may be the source. I have talked to her and her family about this and the possibility of exploratory surgery to try to figure out why she is sick and see if we can help her. They refuse surgery at this point. We will continue to treat her aggressively medically and follow her closely.

## 2011-11-21 NOTE — Procedures (Signed)
US guided diagnostic/therapeutic paracentesis performed yielding 1.5 liters slightly turbid, yellow fluid. A portion of the fluid was sent to the lab for preordered studies. No immediate complications.

## 2011-11-21 NOTE — Progress Notes (Signed)
Subjective: Confused. Complains of abd pain all over  Objective: Vital signs in last 24 hours: Temp:  [96.8 F (36 C)-99 F (37.2 C)] 98.2 F (36.8 C) (06/24 0800) Pulse Rate:  [44-130] 101  (06/24 0800) Resp:  [15-30] 23  (06/24 0800) BP: (78-153)/(38-83) 102/51 mmHg (06/24 0800) SpO2:  [92 %-100 %] 99 % (06/24 0800) Weight:  [245 lb 6 oz (111.3 kg)-253 lb 15.5 oz (115.2 kg)] 253 lb 15.5 oz (115.2 kg) (06/24 0000) Last BM Date: 11/19/11  Intake/Output from previous day: 06/23 0701 - 06/24 0700 In: 5884.9 [P.O.:120; I.V.:4497.9; IV Piggyback:1152] Out: 221 [Urine:220; Stool:1] Intake/Output this shift: Total I/O In: 725 [I.V.:725] Out: 20 [Urine:20]  GI: soft, moderate diffuse abd tenderness and distension. no peritonitis  Lab Results:   Basename 11/21/11 0145 11/20/11 1228  WBC 10.9* 15.0*  HGB 9.8* 11.1*  HCT 30.0* 34.3*  PLT 316 360   BMET  Basename 11/21/11 0145 11/20/11 2240  NA 123* 123*  K 5.3* 5.4*  CL 93* 92*  CO2 20 19  GLUCOSE 140* 153*  BUN 39* 37*  CREATININE 2.81* 2.86*  CALCIUM 7.8* 7.9*   PT/INR  Basename 11/19/11 2155  LABPROT 14.6  INR 1.12   ABG  Basename 11/20/11 1623 11/20/11 1241  PHART 7.316* 7.286*  HCO3 19.1* 18.0*    Studies/Results: Ct Abdomen Pelvis Wo Contrast  11/19/2011  *RADIOLOGY REPORT*  Clinical Data: 66 year old female with abdominal pelvic pain, nausea and vomiting.  History of hysterectomy  CT ABDOMEN AND PELVIS WITHOUT CONTRAST  Technique:  Multidetector CT imaging of the abdomen and pelvis was performed following the standard protocol without intravenous contrast.  Comparison: None  Findings: There is a small-moderate amount of ascites within the abdomen and pelvis. There is wall thickening of the majority of the stomach - nonspecific but neoplasm is not excluded. There is omental stranding identified and metastatic disease is not excluded.  The liver, spleen, adrenal glands, kidneys, and pancreas are  unremarkable.  Gallstones are noted filling the gallbladder and a 3 mm stone is noted within the cystic duct.  There is no CT evidence of acute cholecystitis.  Please note that parenchymal abnormalities may be missed as intravenous contrast was not administered.  Sigmoid colonic diverticulosis noted without diverticulitis. The bladder is within normal limits.  There is no evidence of biliary dilatation or abdominal aortic aneurysm. The patient is status post hysterectomy.  No acute or suspicious bony abnormalities are noted.  IMPRESSION: Apparent gastric wall thickening - neoplasm is not excluded and further evaluation is recommended.  Small to moderate ascites and omental stranding - nonspecific but metastatic disease is not excluded.  Cholelithiasis and 3 mm cystic duct stone - no CT evidence of acute cholecystitis.  Original Report Authenticated By: Lura Em, M.D.   US Renal Port  11/20/2011  *RADIOLOGY REPORT*  Clinical Data: Acute renal failure.  History diabetes hypertension.  RENAL/URINARY TRACT ULTRASOUND COMPLETE  Comparison:  Unenhanced CT abdomen and pelvis yesterday.  Findings:  Right Kidney:  No hydronephrosis.  Well-preserved cortex.  No shadowing calculi.  Normal size and parenchymal echotexture without focal abnormalities.  Approximately 10.7 cm in length.  Left Kidney:  Suboptimally imaged due to body habitus. No hydronephrosis.  Well-preserved cortex.  No shadowing calculi. Normal size and parenchymal echotexture without focal abnormalities.  Approximately 11.0 cm in length.  Bladder:  Decompressed by Foley catheter.  IMPRESSION: Normal examination with a caveat that the left kidney was not optimally imaged due to body habitus;  however, the left kidney was normal in appearance on the unenhanced CT yesterday.  Original Report Authenticated By: Deniece Portela, M.D.   Dg Chest Port 1 View  11/21/2011  *RADIOLOGY REPORT*  Clinical Data: Right CVP line placement.  PORTABLE CHEST - 1 VIEW   Comparison: 11/20/2011  Findings: Interval placement of a right central venous catheter with tip in the low SVC region.  No pneumothorax.  Shallow inspiration.  Mild cardiac enlargement.  Pulmonary vascularity is normal for shallow inspiration.  Atelectasis in the lung bases.  IMPRESSION: Right central venous catheter with tip over the low SVC region.  No pneumothorax.  Otherwise stable appearance of the chest.  Original Report Authenticated By: Neale Burly, M.D.   Dg Chest Port 1 View  11/20/2011  *RADIOLOGY REPORT*  Clinical Data: Syncope  PORTABLE CHEST - 1 VIEW  Comparison: 03/17/2011  Findings: Low volumes.  Moderate cardiomegaly.  Bibasilar atelectasis.  No pneumothorax. No edema.  IMPRESSION: Bibasilar atelectasis.  Original Report Authenticated By: Jamas Lav, M.D.   Dg Abd Acute W/chest  11/19/2011  *RADIOLOGY REPORT*  Clinical Data: Abdominal pain.  ACUTE ABDOMEN SERIES (ABDOMEN 2 VIEW & CHEST 1 VIEW)  Comparison: 03/17/2011  Findings: Chest radiograph demonstrates clear lungs.  Heart size is upper limits normal but unchanged.  Trachea is midline.  No evidence for free air.  Nonobstructive bowel gas pattern. Gas- filled loop of bowel in the left lower abdomen may be related to colon.  Degenerative changes in the lumbar spine.  There is joint space narrowing in both hips.  IMPRESSION: No acute chest findings.  Nonspecific abdominal bowel gas pattern.  There is one large gas filled bowel loop in the left lower abdomen.  Original Report Authenticated By: Markus Daft, M.D.    Anti-infectives: Anti-infectives     Start     Dose/Rate Route Frequency Ordered Stop   11/21/11 0100   metroNIDAZOLE (FLAGYL) IVPB 500 mg        500 mg 100 mL/hr over 60 Minutes Intravenous Every 8 hours 11/21/11 0018     11/21/11 0100   cefTRIAXone (ROCEPHIN) 1 g in dextrose 5 % 50 mL IVPB        1 g 100 mL/hr over 30 Minutes Intravenous Every 24 hours 11/21/11 0038            Assessment/Plan: s/p * No  surgery found * will await results of endoscopy today  LOS: 2 days    TOTH III,Kaprice Kage S 11/21/2011

## 2011-11-21 NOTE — Progress Notes (Signed)
Inpatient Diabetes Program Recommendations  AACE/ADA: New Consensus Statement on Inpatient Glycemic Control (2009)  Target Ranges:  Prepandial:   less than 140 mg/dL      Peak postprandial:   less than 180 mg/dL (1-2 hours)      Critically ill patients:  140 - 180 mg/dL   Reason for Visit: Note history of diabetes.  Patient is NPO.  Inpatient Diabetes Program Recommendations Correction (SSI): Consider changing correction scale to sensitive q 4 hours (instead of tid ac and HS) since patient is NPO.

## 2011-11-21 NOTE — Progress Notes (Signed)
Name: Amanda Cain MRN: [id number] DOB: [birth year]    LOS: 2  PULMONARY / CRITICAL CARE MEDICINE NOTE  HPI:   66 yo f with DM, CAD, HTN, obesity, hypothyroidism and GERD. Adm  6/22/  for evaluation and management of lower abdominal pain and vomiting for 3 to 4 days> CT scan of the abdomen showed gastric wall thickening, small to moderate ascites and omental stranding, cholelithiasis and 3 mm cystic duct stone, without evidence of acute cholecystitis. At admission she was also found to have a creatinine of 2.59. General surgery, GI and nephrology consults were called. Afternoon of 6/23  the patient experienced a syncopal episode  And then remainedhypotensive and oliguric.  >Critical care consult was called(6/23) to help in the evaluation and management.   Lines and tubes: 6/24 rt i j cvl>>  Micro: 6/24 bc x 2>> 6/24 uc>>  Abx: 6/24 roc(?uti)>> 6/24 flagyl(diarrhea) >>  Vital Signs: Temp:  [96.8 F (36 C)-99 F (37.2 C)] 98.2 F (36.8 C) (06/24 0800) Pulse Rate:  [44-130] 110  (06/24 1026) Resp:  [15-30] 24  (06/24 1026) BP: (78-164)/(38-84) 164/84 mmHg (06/24 1026) SpO2:  [92 %-100 %] 95 % (06/24 1026) Weight:  [245 lb 6 oz (111.3 kg)-253 lb 15.5 oz (115.2 kg)] 253 lb 15.5 oz (115.2 kg) (06/24 0000) FIO2 4lpm    Intake/Output Summary (Last 24 hours) at 11/21/11 1326 Last data filed at 11/21/11 1249  Gross per 24 hour  Intake 6306.98 ml  Output    207 ml  Net 6099.98 ml    CVP:  [16 mmHg-28 mmHg] 16 mmHg   Physical Examination: General:  Awake, alert, no acute distress. Anxious. Neuro:  Oriented x 3. No focal neurological deficits. HEENT:  PERRLA, pink conjunctivae, dry mucous membranes Neck:  Supple, no JVD  . Rt I J cvl noted. Cardiovascular:  RRR, no M/R/G. Brief episode of svt with cvl insertion 6/24 Lungs:  Diminished breath sounds bilaterally. Clear to auscultation. upper airway wheezing. Abdomen:  Obese, prominent abdomen. Soft, nontender, distended, bowel  sounds present Musculoskeletal:  Moves all extremities, no pedal edema Skin:  No rash   Lab 11/20/11 1623 11/20/11 1241  PHART 7.316* 7.286*  PCO2ART 38.5 38.9  PO2ART 92.7 140.0*  HCO3 19.1* 18.0*  O2SAT 98.2 98.3    Lab 11/20/11 1708 11/20/11 1228 11/19/11 2155  TROPONINI -- <0.30 --  LATICACIDVEN 0.9 -- --  PROBNP -- -- 130.6*    Lab 11/21/11 0145 11/20/11 2240 11/20/11 1228  NA 123* 123* 122*  K 5.3* 5.4* 4.5  CL 93* 92* 88*  CO2 20 19 19   BUN 39* 37* 37*  CREATININE 2.81* 2.86* 3.01*  GLUCOSE 140* 153* 237*    Lab 11/21/11 0145 11/20/11 1228 11/20/11 0531  HGB 9.8* 11.1* 10.8*  HCT 30.0* 34.3* 33.6*  WBC 10.9* 15.0* 11.9*  PLT 316 360 367     Intake/Output Summary (Last 24 hours) at 11/21/11 1041 Last data filed at 11/21/11 1000  Gross per 24 hour  Intake 6887.92 ml  Output    195 ml  Net 6692.92 ml      Lab 11/20/11 2240 11/20/11 0531 11/19/11 1155  AST 21 15 18   ALT 12 10 11   ALKPHOS 65 78 76  BILITOT 0.3 0.4 0.4  PROT 5.1* 6.1 6.6  ALBUMIN 2.2* 2.9* 3.2*     Lab 11/21/11 0006 11/20/11 2302 11/20/11 1931 11/20/11 1633 11/20/11 1237  GLUCAP 146* 141* 152* 207* 235*   Ct Abdomen Pelvis Wo Contrast  11/19/2011  *RADIOLOGY REPORT*  Clinical Data: 66 year old female with abdominal pelvic pain, nausea and vomiting.  History of hysterectomy  CT ABDOMEN AND PELVIS WITHOUT CONTRAST  Technique:  Multidetector CT imaging of the abdomen and pelvis was performed following the standard protocol without intravenous contrast.  Comparison: None  Findings: There is a small-moderate amount of ascites within the abdomen and pelvis. There is wall thickening of the majority of the stomach - nonspecific but neoplasm is not excluded. There is omental stranding identified and metastatic disease is not excluded.  The liver, spleen, adrenal glands, kidneys, and pancreas are unremarkable.  Gallstones are noted filling the gallbladder and a 3 mm stone is noted within the cystic  duct.  There is no CT evidence of acute cholecystitis.  Please note that parenchymal abnormalities may be missed as intravenous contrast was not administered.  Sigmoid colonic diverticulosis noted without diverticulitis. The bladder is within normal limits.  There is no evidence of biliary dilatation or abdominal aortic aneurysm. The patient is status post hysterectomy.  No acute or suspicious bony abnormalities are noted.  IMPRESSION: Apparent gastric wall thickening - neoplasm is not excluded and further evaluation is recommended.  Small to moderate ascites and omental stranding - nonspecific but metastatic disease is not excluded.  Cholelithiasis and 3 mm cystic duct stone - no CT evidence of acute cholecystitis.  Original Report Authenticated By: Lura Em, M.D.   US Renal Port  11/20/2011  *RADIOLOGY REPORT*  Clinical Data: Acute renal failure.  History diabetes hypertension.  RENAL/URINARY TRACT ULTRASOUND COMPLETE  Comparison:  Unenhanced CT abdomen and pelvis yesterday.  Findings:  Right Kidney:  No hydronephrosis.  Well-preserved cortex.  No shadowing calculi.  Normal size and parenchymal echotexture without focal abnormalities.  Approximately 10.7 cm in length.  Left Kidney:  Suboptimally imaged due to body habitus. No hydronephrosis.  Well-preserved cortex.  No shadowing calculi. Normal size and parenchymal echotexture without focal abnormalities.  Approximately 11.0 cm in length.  Bladder:  Decompressed by Foley catheter.  IMPRESSION: Normal examination with a caveat that the left kidney was not optimally imaged due to body habitus; however, the left kidney was normal in appearance on the unenhanced CT yesterday.  Original Report Authenticated By: Deniece Portela, M.D.   Dg Chest Port 1 View  11/21/2011  *RADIOLOGY REPORT*  Clinical Data: Right CVP line placement.  PORTABLE CHEST - 1 VIEW  Comparison: 11/20/2011  Findings: Interval placement of a right central venous catheter with tip in the  low SVC region.  No pneumothorax.  Shallow inspiration.  Mild cardiac enlargement.  Pulmonary vascularity is normal for shallow inspiration.  Atelectasis in the lung bases.  IMPRESSION: Right central venous catheter with tip over the low SVC region.  No pneumothorax.  Otherwise stable appearance of the chest.  Original Report Authenticated By: Neale Burly, M.D.   Dg Chest Port 1 View  11/20/2011  *RADIOLOGY REPORT*  Clinical Data: Syncope  PORTABLE CHEST - 1 VIEW  Comparison: 03/17/2011  Findings: Low volumes.  Moderate cardiomegaly.  Bibasilar atelectasis.  No pneumothorax. No edema.  IMPRESSION: Bibasilar atelectasis.  Original Report Authenticated By: Jamas Lav, M.D.   Dg Abd Acute W/chest  11/19/2011  *RADIOLOGY REPORT*  Clinical Data: Abdominal pain.  ACUTE ABDOMEN SERIES (ABDOMEN 2 VIEW & CHEST 1 VIEW)  Comparison: 03/17/2011  Findings: Chest radiograph demonstrates clear lungs.  Heart size is upper limits normal but unchanged.  Trachea is midline.  No evidence for free air.  Nonobstructive  bowel gas pattern. Gas- filled loop of bowel in the left lower abdomen may be related to colon.  Degenerative changes in the lumbar spine.  There is joint space narrowing in both hips.  IMPRESSION: No acute chest findings.  Nonspecific abdominal bowel gas pattern.  There is one large gas filled bowel loop in the left lower abdomen.  Original Report Authenticated By: Markus Daft, M.D.    Principal Problem:  *Abdominal pain Active Problems:  Obesity (BMI 30.0-34.9)  Hyponatremia  Anemia  Acute kidney injury  Moderate protein-calorie malnutrition  Diabetes mellitus  HTN (hypertension)  Hypothyroidism  Dyslipidemia  Syncope and collapse  Shock   ASSESSMENT AND PLAN  66 year old female with PMH relevant for DM, HTN, CAD, obesity and hypothyroidism. Presents with abdominal pain and vomiting and found to be in acute renal failure at admission. A CT scan of the abdomen showed gastric wall  thickening, small to moderate ascites and omental stranding. It also showed cholelithiasis and 3 mm cystic duct stone with no CT evidence of acute cholecystitis. Today she had a syncopal episode and developed persistent hypotension and anuria. She has responded well to fluid resuscitation (close to 6 L positive since admission) but remains anuric. Her acute renal failure is likely secondary to ATN due to volume depletion and also medication related including ARB, diuretic and NSAIDs. Renal US did not showed hydronephrosis.  Nephrology input appreciated. Currently no indication for emergent dialysis. With persistent hypotension and elevated WBC of 15 at admission in the setting of abdominal pain, vomiting and an abnormal CT of the abdomen I'm concerned for infection of intraabdominal source. Her abdomen is benign on exam but she is a diabetic chronically ill lady and may not be able to mount a full inflammatroy response. Of note, she had an echocardiogram today that showed normal LVEF with impaired relaxation diastolic dysfunction. Chest X ray did not show any acute infiltrates but showed small volumes with atelectasis.  Plan: 1) Hypotension, possible sepsis of abdominal origin. - pan cultured -  Rocephin and Flagyl for possible intraabdominal infection - Continue cautious IV fluid resuscitation to keep MAP of >65 - pressors as needed - cvl placed 6/24  2) Acute renal failure / hyperkalemia / Hyponatremia  -renal US 6/23 > negative - Nephrology  following - Metformin and antihypertensives on hold - Will continue IVF's  - Strict I&O's   3) Mild hypoxemia with ATX from abd distension/ 02 dep - Likely secondary to atelectasis. Possibly some fluid overload.  No acute infiltrates on X ray  - We will continue to monitor for pulmonary edema as she is getting frequent IVF fluid boluses and she is anuric. -BD with xopenex  4) DM - Continue insulin sliding scale  5) DVT prophylaxis - Heparin   The  patient is critically ill with multiple organ systems failure and requires high complexity decision making for assessment and support, frequent evaluation and titration of therapies, application of advanced monitoring technologies and extensive interpretation of multiple databases. Critical Care Time devoted to patient care services described in this note is 45 minutes.   Christinia Gully, MD Pulmonary and [facility] [phone]

## 2011-11-21 NOTE — Progress Notes (Signed)
eLink Physician-Brief Progress Note Patient Name: Amanda Cain DOB: [birth year] MRN: [id number]  Date of Service  11/21/2011   HPI/Events of Note   Hypotension with ARF, Pos 6L  eICU Interventions  Start neo gtt Will need CVl , CVP Obtain FeNa   Intervention Category Intermediate Interventions: Hypotension - evaluation and management  Safwan Tomei V. 11/21/2011, 4:18 AM

## 2011-11-21 NOTE — Progress Notes (Signed)
Discussed with pt, Dr Collene Mares and Marlou Starks She is getting worse with increased abd girth c/w 3rd spaced fluid while attempting to vol expand and get her off pressors  ddx is cancer and or ischemia/peritonitis  Will repeat abd [place] and tap for cell count and diff, gm stain and cultre and cytology

## 2011-11-21 NOTE — Progress Notes (Signed)
  Comment: Patient is off the unit, having V/Q scan.  Main issues: 1. Abdominal pain: Cholelithiais. 2. Gastric wall thickening, suspicious for malignant disease. 3. Oliguric ARF/ATN 4. Persistent hypotension/Syncopal episode.. 5. Possible sepsis. 6. Hyponatremia.  7. DM 8. Hypothyroidism. 9. Dyslipidemia.  10. Anemia.   Patient remains persistently oligo-anuric, unwell, is on pressors, D-Dimer is markedly elevated, raising suspicion for PE. Requiring critical level of care, with central venous pressure monitoring and judicious fluid management. PCCM has kindly agreed to take over care. Medical team has signed off today.

## 2011-11-21 NOTE — Consult Note (Deleted)
Name: Amanda Cain MRN: [id number] DOB: 02/04/1946    LOS: 2  PULMONARY / CRITICAL CARE MEDICINE NOTE  HPI:   66 year old female with complex PMH relevant for DM, CAD, HTN, obesity, hypothyroidism and GERD. Admitted to the hospital on 11/19/11 for evaluation and management of lower abdominal pain and vomiting for 3 to 4 days. At admission denied any respiratory symptoms including SOB, CP, cough, wheezing, sputum production. A CT scan of the abdomen showed gastric wall thickening, small to moderate ascites and omental stranding, cholelithiasis and 3 mm cystic duct stone, without evidence of acute cholecystitis. At admission she was also found to have a creatinine of 2.59. General surgery, GI and nephrology consults were called.Afternoon of 6/23  the patient experienced a syncopal episode likely associated to low BP. Since then she has remained hypotensive and oliguric. She  was transferred to the intermediate care unit and fluid resuscitation was started with good response and her BP but  is anuric. Critical care consult was called(6/23) to help in the evaluation and management. Lines and tubes: 6/24 rt i j cvl>>  Micro: 6/24 bc x 2>> 6/24 uc>>  Abx: 6/24 roc(?uti)>> 6/24 flagyl(diarrhea) >>  Vital Signs: Temp:  [96.8 F (36 C)-99 F (37.2 C)] 99 F (37.2 C) (06/24 0400) Pulse Rate:  [44-130] 89  (06/24 0500) Resp:  [15-30] 24  (06/24 0500) BP: (78-153)/(38-83) 98/52 mmHg (06/24 0500) SpO2:  [92 %-100 %] 99 % (06/24 0500) Weight:  [245 lb 6 oz (111.3 kg)-253 lb 15.5 oz (115.2 kg)] 253 lb 15.5 oz (115.2 kg) (06/24 0000)  Physical Examination: General:  Awake, alert, no acute distress. Anxious. Neuro:  Oriented x 3. No focal neurological deficits. HEENT:  PERRLA, pink conjunctivae, dry mucous membranes Neck:  Supple, no JVD  . Rt I J cvl noted. Cardiovascular:  RRR, no M/R/G. Brief episode of svt with cvl insertion 6/24 Lungs:  Diminished breath sounds bilaterally. Clear to  auscultation. upper airway wheezing. Abdomen:  Obese, prominent abdomen. Soft, nontender, distended, bowel sounds present Musculoskeletal:  Moves all extremities, no pedal edema Skin:  No rash   Lab 11/20/11 1623 11/20/11 1241  PHART 7.316* 7.286*  PCO2ART 38.5 38.9  PO2ART 92.7 140.0*  HCO3 19.1* 18.0*  O2SAT 98.2 98.3    Lab 11/20/11 1708 11/20/11 1228 11/19/11 2155  TROPONINI -- <0.30 --  LATICACIDVEN 0.9 -- --  PROBNP -- -- 130.6*    Lab 11/21/11 0145 11/20/11 2240 11/20/11 1228  NA 123* 123* 122*  K 5.3* 5.4* 4.5  CL 93* 92* 88*  CO2 20 19 19   BUN 39* 37* 37*  CREATININE 2.81* 2.86* 3.01*  GLUCOSE 140* 153* 237*    Lab 11/21/11 0145 11/20/11 1228 11/20/11 0531  HGB 9.8* 11.1* 10.8*  HCT 30.0* 34.3* 33.6*  WBC 10.9* 15.0* 11.9*  PLT 316 360 367     Intake/Output Summary (Last 24 hours) at 11/21/11 0646 Last data filed at 11/21/11 0553  Gross per 24 hour  Intake 5559.92 ml  Output    121 ml  Net 5438.92 ml      Lab 11/20/11 2240 11/20/11 0531 11/19/11 1155  AST 21 15 18   ALT 12 10 11   ALKPHOS 65 78 76  BILITOT 0.3 0.4 0.4  PROT 5.1* 6.1 6.6  ALBUMIN 2.2* 2.9* 3.2*     Lab 11/21/11 0006 11/20/11 2302 11/20/11 1931 11/20/11 1633 11/20/11 1237  GLUCAP 146* 141* 152* 207* 235*   Ct Abdomen Pelvis Wo Contrast  11/19/2011  *  RADIOLOGY REPORT*  Clinical Data: 66 year old female with abdominal pelvic pain, nausea and vomiting.  History of hysterectomy  CT ABDOMEN AND PELVIS WITHOUT CONTRAST  Technique:  Multidetector CT imaging of the abdomen and pelvis was performed following the standard protocol without intravenous contrast.  Comparison: None  Findings: There is a small-moderate amount of ascites within the abdomen and pelvis. There is wall thickening of the majority of the stomach - nonspecific but neoplasm is not excluded. There is omental stranding identified and metastatic disease is not excluded.  The liver, spleen, adrenal glands, kidneys, and pancreas  are unremarkable.  Gallstones are noted filling the gallbladder and a 3 mm stone is noted within the cystic duct.  There is no CT evidence of acute cholecystitis.  Please note that parenchymal abnormalities may be missed as intravenous contrast was not administered.  Sigmoid colonic diverticulosis noted without diverticulitis. The bladder is within normal limits.  There is no evidence of biliary dilatation or abdominal aortic aneurysm. The patient is status post hysterectomy.  No acute or suspicious bony abnormalities are noted.  IMPRESSION: Apparent gastric wall thickening - neoplasm is not excluded and further evaluation is recommended.  Small to moderate ascites and omental stranding - nonspecific but metastatic disease is not excluded.  Cholelithiasis and 3 mm cystic duct stone - no CT evidence of acute cholecystitis.  Original Report Authenticated By: Lura Em, M.D.   US Renal Port  11/20/2011  *RADIOLOGY REPORT*  Clinical Data: Acute renal failure.  History diabetes hypertension.  RENAL/URINARY TRACT ULTRASOUND COMPLETE  Comparison:  Unenhanced CT abdomen and pelvis yesterday.  Findings:  Right Kidney:  No hydronephrosis.  Well-preserved cortex.  No shadowing calculi.  Normal size and parenchymal echotexture without focal abnormalities.  Approximately 10.7 cm in length.  Left Kidney:  Suboptimally imaged due to body habitus. No hydronephrosis.  Well-preserved cortex.  No shadowing calculi. Normal size and parenchymal echotexture without focal abnormalities.  Approximately 11.0 cm in length.  Bladder:  Decompressed by Foley catheter.  IMPRESSION: Normal examination with a caveat that the left kidney was not optimally imaged due to body habitus; however, the left kidney was normal in appearance on the unenhanced CT yesterday.  Original Report Authenticated By: Deniece Portela, M.D.   Dg Chest Port 1 View  11/20/2011  *RADIOLOGY REPORT*  Clinical Data: Syncope  PORTABLE CHEST - 1 VIEW  Comparison:  03/17/2011  Findings: Low volumes.  Moderate cardiomegaly.  Bibasilar atelectasis.  No pneumothorax. No edema.  IMPRESSION: Bibasilar atelectasis.  Original Report Authenticated By: Jamas Lav, M.D.   Dg Abd Acute W/chest  11/19/2011  *RADIOLOGY REPORT*  Clinical Data: Abdominal pain.  ACUTE ABDOMEN SERIES (ABDOMEN 2 VIEW & CHEST 1 VIEW)  Comparison: 03/17/2011  Findings: Chest radiograph demonstrates clear lungs.  Heart size is upper limits normal but unchanged.  Trachea is midline.  No evidence for free air.  Nonobstructive bowel gas pattern. Gas- filled loop of bowel in the left lower abdomen may be related to colon.  Degenerative changes in the lumbar spine.  There is joint space narrowing in both hips.  IMPRESSION: No acute chest findings.  Nonspecific abdominal bowel gas pattern.  There is one large gas filled bowel loop in the left lower abdomen.  Original Report Authenticated By: Markus Daft, M.D.    Principal Problem:  *Abdominal pain Active Problems:  Obesity (BMI 30.0-34.9)  Hyponatremia  Anemia  Acute kidney injury  Moderate protein-calorie malnutrition  Diabetes mellitus  HTN (hypertension)  Hypothyroidism  Dyslipidemia  Syncope and collapse   ASSESSMENT AND PLAN  66 year old female with PMH relevant for DM, HTN, CAD, obesity and hypothyroidism. Presents with abdominal pain and vomiting and found to be in acute renal failure at admission. A CT scan of the abdomen showed gastric wall thickening, small to moderate ascites and omental stranding. It also showed cholelithiasis and 3 mm cystic duct stone with no CT evidence of acute cholecystitis. Today she had a syncopal episode and developed persistent hypotension and anuria. She has responded well to fluid resuscitation (close to 6 L positive since admission) but remains anuric. Her acute renal failure is likely secondary to ATN due to volume depletion and also medication related including ARB, diuretic and NSAIDs. Renal US did not  showed hydronephrosis.  Nephrology input appreciated. Currently no indication for emergent dialysis. With persistent hypotension and elevated WBC of 15 at admission in the setting of abdominal pain, vomiting and an abnormal CT of the abdomen I'm concerned for infection of intraabdominal source. Her abdomen is benign on exam but she is a diabetic chronically ill lady and may not be able to mount a full inflammatroy response. Of note, she had an echocardiogram today that showed normal LVEF with impaired relaxation diastolic dysfunction. Chest X ray did not show any acute infiltrates but showed small volumes with atelectasis.  Plan: 1) Hypotension, possible sepsis of abdominal origin. - pan culture - Will start Rocephin and Flagyl for possible intraabdominal infection - Continue cautious IV fluid resuscitation to keep MAP of >65 - pressors as needed - cvl placed 6/24.  2) Acute renal failure / hyperkalemia / Hyponatremia - Nephrology input appreciated. - Metformin and antihypertensives on hold - Will continue IVF's  - Strict I&O's - Kayexalate once 6/23 - Follow up CMP  -renal US negative  3) Mild hypoxemia with ATX from abd distension. - Likely secondary to atelectasis. Possibly some fluid overload.  No acute infiltrates on X ray - Stable at 2 L via [place] - We will continue to monitor for pulmonary edema as she is getting frequent IVF fluid boluses and she is anuric. -BD with xopenex 4) DM - Continue insulin sliding scale  5) DVT prophylaxis - Heparin   Richardson Landry Noemie Devivo ACNP [facility] PCCM Pager [phone] till 3 pm If no answer page [phone] 11/21/2011, 6:41 AM

## 2011-11-21 NOTE — Procedures (Signed)
Central Venous Catheter Insertion Procedure Note SAANVI MALIA [id number] 10-13-1945  Procedure: Insertion of Central Venous Catheter Indications: Drug and/or fluid administration  Procedure Details Consent: Risks of procedure as well as the alternatives and risks of each were explained to the (patient/caregiver).  Consent for procedure obtained. Time Out: Verified patient identification, verified procedure, site/side was marked, verified correct patient position, special equipment/implants available, medications/allergies/relevent history reviewed, required imaging and test results available.  Performed  Maximum sterile technique was used including antiseptics, cap, gloves, gown, hand hygiene, mask and sheet. Skin prep: Chlorhexidine; local anesthetic administered A antimicrobial bonded/coated triple lumen catheter was placed in the right internal jugular vein using the Seldinger technique. Ultrasound guidance used.yes Catheter placed to 17 cm. Blood aspirated via all 3 ports and then flushed x 3. Line sutured x 2 and dressing applied.  Evaluation Blood flow good Complications: No apparent complications Patient did tolerate procedure well. Chest X-ray ordered to verify placement.  CXR: pending.  Richardson Landry Minor ACNP [facility] P[facility] Pager [phone] till 3 pm If no answer page [phone] 11/21/2011, 6:28 AM   I supervised this procedure  Christinia Gully, MD Pulmonary and [facility] [phone]

## 2011-11-21 NOTE — Progress Notes (Signed)
Patient ID: Amanda Cain, female   DOB: 05/14/1946, 66 y.o.   MRN: [id number] S:feels better today O:BP 96/55  Pulse 102  Temp 98.2 F (36.8 C) (Axillary)  Resp 26  Ht 4\' 11"  (1.499 m)  Wt 115.2 kg (253 lb 15.5 oz)  BMI 51.30 kg/m2  SpO2 99%  Intake/Output Summary (Last 24 hours) at 11/21/11 1221 Last data filed at 11/21/11 1200  Gross per 24 hour  Intake 7271.77 ml  Output    195 ml  Net 7076.77 ml   Weight change: 2.255 kg (4 lb 15.6 oz) Gen:WD obese AAF in mild distress [id number], no rub Resp:decreased BS at bases [id number], distended, hypoactive BS, NT Ext:1+edema   Lab 11/21/11 0145 11/20/11 2240 11/20/11 1228 11/20/11 0531 11/19/11 2155 11/19/11 1155  NA 123* 123* 122* 122* -- 123*  K 5.3* 5.4* 4.5 4.2 -- 4.0  CL 93* 92* 88* 89* -- 86*  CO2 20 19 19 20  -- 26  GLUCOSE 140* 153* 237* 204* -- 170*  BUN 39* 37* 37* 36* -- 35*  CREATININE 2.81* 2.86* 3.01* 2.58* -- 2.59*  ALBUMIN 2.2* 2.2* -- 2.9* -- 3.2*  CALCIUM 7.8* 7.9* 8.8 8.9 -- 9.1  PHOS 3.7 -- -- -- 3.3 --  AST -- 21 -- 15 -- 18  ALT -- 12 -- 10 -- 11   Liver Function Tests:  Lab 11/21/11 0145 11/20/11 2240 11/20/11 0531 11/19/11 1155  AST -- 21 15 18   ALT -- 12 10 11   ALKPHOS -- 65 78 76  BILITOT -- 0.3 0.4 0.4  PROT -- 5.1* 6.1 6.6  ALBUMIN 2.2* 2.2* 2.9* --    Lab 11/19/11 1155  LIPASE 18  AMYLASE --   No results found for this basename: AMMONIA:3 in the last 168 hours CBC:  Lab 11/21/11 0145 11/20/11 1228 11/20/11 0531 11/19/11 1155  WBC 10.9* 15.0* 11.9* --  NEUTROABS -- -- -- 6.2  HGB 9.8* 11.1* 10.8* --  HCT 30.0* 34.3* 33.6* --  MCV 81.5 81.5 81.0 81.3  PLT 316 360 367 --   Cardiac Enzymes:  Lab 11/20/11 1228  CKTOTAL 117  CKMB 4.1*  CKMBINDEX --  TROPONINI <0.30   CBG:  Lab 11/21/11 0747 11/21/11 0318 11/21/11 0006 11/20/11 2302 11/20/11 1931  GLUCAP 159* 152* 146* 141* 152*    Iron Studies: No results found for this basename: IRON,TIBC,TRANSFERRIN,FERRITIN in the last  72 hours Studies/Results: Ct Abdomen Pelvis Wo Contrast  11/19/2011  *RADIOLOGY REPORT*  Clinical Data: 66 year old female with abdominal pelvic pain, nausea and vomiting.  History of hysterectomy  CT ABDOMEN AND PELVIS WITHOUT CONTRAST  Technique:  Multidetector CT imaging of the abdomen and pelvis was performed following the standard protocol without intravenous contrast.  Comparison: None  Findings: There is a small-moderate amount of ascites within the abdomen and pelvis. There is wall thickening of the majority of the stomach - nonspecific but neoplasm is not excluded. There is omental stranding identified and metastatic disease is not excluded.  The liver, spleen, adrenal glands, kidneys, and pancreas are unremarkable.  Gallstones are noted filling the gallbladder and a 3 mm stone is noted within the cystic duct.  There is no CT evidence of acute cholecystitis.  Please note that parenchymal abnormalities may be missed as intravenous contrast was not administered.  Sigmoid colonic diverticulosis noted without diverticulitis. The bladder is within normal limits.  There is no evidence of biliary dilatation or abdominal aortic aneurysm. The patient is status post hysterectomy.  No acute  or suspicious bony abnormalities are noted.  IMPRESSION: Apparent gastric wall thickening - neoplasm is not excluded and further evaluation is recommended.  Small to moderate ascites and omental stranding - nonspecific but metastatic disease is not excluded.  Cholelithiasis and 3 mm cystic duct stone - no CT evidence of acute cholecystitis.  Original Report Authenticated By: Lura Em, M.D.   Nm Pulmonary Perfusion  11/21/2011  *RADIOLOGY REPORT*  Clinical Data: Shortness of breath  NM PULMONARY PERFUSION PARTICULATE  Radiopharmaceutical: 3.4MILLI CURIE MAA TECHNETIUM TO 38M ALBUMIN AGGREGATED  Comparison: Chest x-ray from 11/21/2011  Findings: Multiplanar perfusion imaging shows no peripheral moderate or large perfusion  defect in either lung.  Elevation of the right hemidiaphragm accounts for decreased activity at the right base.  Given the normal bilateral perfusion, ventilation imaging was not performed.  IMPRESSION: Normal bilateral lung perfusion.  Original Report Authenticated By: ERIC A. MANSELL, M.D.   US Renal Port  11/20/2011  *RADIOLOGY REPORT*  Clinical Data: Acute renal failure.  History diabetes hypertension.  RENAL/URINARY TRACT ULTRASOUND COMPLETE  Comparison:  Unenhanced CT abdomen and pelvis yesterday.  Findings:  Right Kidney:  No hydronephrosis.  Well-preserved cortex.  No shadowing calculi.  Normal size and parenchymal echotexture without focal abnormalities.  Approximately 10.7 cm in length.  Left Kidney:  Suboptimally imaged due to body habitus. No hydronephrosis.  Well-preserved cortex.  No shadowing calculi. Normal size and parenchymal echotexture without focal abnormalities.  Approximately 11.0 cm in length.  Bladder:  Decompressed by Foley catheter.  IMPRESSION: Normal examination with a caveat that the left kidney was not optimally imaged due to body habitus; however, the left kidney was normal in appearance on the unenhanced CT yesterday.  Original Report Authenticated By: Deniece Portela, M.D.   Dg Chest Port 1 View  11/21/2011  *RADIOLOGY REPORT*  Clinical Data: Right CVP line placement.  PORTABLE CHEST - 1 VIEW  Comparison: 11/20/2011  Findings: Interval placement of a right central venous catheter with tip in the low SVC region.  No pneumothorax.  Shallow inspiration.  Mild cardiac enlargement.  Pulmonary vascularity is normal for shallow inspiration.  Atelectasis in the lung bases.  IMPRESSION: Right central venous catheter with tip over the low SVC region.  No pneumothorax.  Otherwise stable appearance of the chest.  Original Report Authenticated By: Neale Burly, M.D.   Dg Chest Port 1 View  11/20/2011  *RADIOLOGY REPORT*  Clinical Data: Syncope  PORTABLE CHEST - 1 VIEW   Comparison: 03/17/2011  Findings: Low volumes.  Moderate cardiomegaly.  Bibasilar atelectasis.  No pneumothorax. No edema.  IMPRESSION: Bibasilar atelectasis.  Original Report Authenticated By: Jamas Lav, M.D.   Dg Abd Acute W/chest  11/19/2011  *RADIOLOGY REPORT*  Clinical Data: Abdominal pain.  ACUTE ABDOMEN SERIES (ABDOMEN 2 VIEW & CHEST 1 VIEW)  Comparison: 03/17/2011  Findings: Chest radiograph demonstrates clear lungs.  Heart size is upper limits normal but unchanged.  Trachea is midline.  No evidence for free air.  Nonobstructive bowel gas pattern. Gas- filled loop of bowel in the left lower abdomen may be related to colon.  Degenerative changes in the lumbar spine.  There is joint space narrowing in both hips.  IMPRESSION: No acute chest findings.  Nonspecific abdominal bowel gas pattern.  There is one large gas filled bowel loop in the left lower abdomen.  Original Report Authenticated By: Markus Daft, M.D.      . cefTRIAXone (ROCEPHIN)  IV  1 g Intravenous Q24H  . flumazenil      .  heparin subcutaneous  5,000 Units Subcutaneous Q12H  . insulin aspart  0-15 Units Subcutaneous TID WC  . insulin aspart  0-5 Units Subcutaneous QHS  . levalbuterol  0.63 mg Nebulization Q6H  . levothyroxine  50 mcg Intravenous Q breakfast  . metronidazole  500 mg Intravenous Q8H  . sodium chloride  2,000 mL Intravenous Once  . sodium chloride  500 mL Intravenous Once  . sodium chloride  500 mL Intravenous Once  . sodium chloride  500 mL Intravenous Once  . sodium chloride  3 mL Intravenous Q12H  . sodium polystyrene  15 g Oral Once  . DISCONTD: atorvastatin  40 mg Oral q1800  . DISCONTD: calcium-vitamin D  1 tablet Oral BID  . DISCONTD: enoxaparin  30 mg Subcutaneous Q24H  . DISCONTD: escitalopram  20 mg Oral QHS  . DISCONTD: ferrous sulfate  325 mg Oral Q breakfast  . DISCONTD: levothyroxine  100 mcg Oral QAC breakfast  . DISCONTD: LORazepam  0.5 mg Oral BID  . DISCONTD: multivitamin with minerals   1 tablet Oral Daily  . DISCONTD: pantoprazole (PROTONIX) IV  40 mg Intravenous Q24H  . DISCONTD: vitamin B-12  1,000 mcg Oral Daily  . DISCONTD: vitamin C  500 mg Oral Daily  . DISCONTD: vitamin E  400 Units Oral Daily    BMET    Component Value Date/Time   NA 123* 11/21/2011 0145   K 5.3* 11/21/2011 0145   CL 93* 11/21/2011 0145   CO2 20 11/21/2011 0145   GLUCOSE 140* 11/21/2011 0145   BUN 39* 11/21/2011 0145   CREATININE 2.81* 11/21/2011 0145   CALCIUM 7.8* 11/21/2011 0145   GFRNONAA 16* 11/21/2011 0145   GFRAA 19* 11/21/2011 0145   CBC    Component Value Date/Time   WBC 10.9* 11/21/2011 0145   RBC 3.68* 11/21/2011 0145   HGB 9.8* 11/21/2011 0145   HCT 30.0* 11/21/2011 0145   PLT 316 11/21/2011 0145   MCV 81.5 11/21/2011 0145   MCH 26.6 11/21/2011 0145   MCHC 32.7 11/21/2011 0145   RDW 16.2* 11/21/2011 0145   LYMPHSABS 1.6 11/19/2011 1155   MONOABS 0.9 11/19/2011 1155   EOSABS 0.4 11/19/2011 1155   BASOSABS 0.0 11/19/2011 1155     Assessment/Plan:  1. AKI/oliguric ARF- creatinine has trended down but with decreased UOP and now requiring pressors.  Agree that initial insult likely ischemic ATN due to volume depletion in setting of ARB and diuretics.  Low FeNa (off diuretics).  Cont with volume repletion, however if she remains oliguric and septic, she may require transfer to [facility] for possible CVVHD.  Work up underway. 2. Hyponatremia due to above.  Stable, cont with IVF but need to check cortisol level to r/o adrenal insufficiency 3. SIRS- per PCCM 4. Hyponatremia- w/u underway.  ?due to malnutrition, liver disease, or myeloma? 5. Anemia- w/u underway 6. Edema- ?due to liver disease or CHF, not nephrotic by Urine prot/creat ratio 7. DM- per primary svc 8. Abd pain- for EGD today  Liani Caris A

## 2011-11-22 ENCOUNTER — Inpatient Hospital Stay (HOSPITAL_COMMUNITY): Payer: Medicare Other

## 2011-11-22 DIAGNOSIS — E871 Hypo-osmolality and hyponatremia: Secondary | ICD-10-CM

## 2011-11-22 LAB — CULTURE, BLOOD (ROUTINE X 2): Culture  Setup Time: 201306240928

## 2011-11-22 LAB — GLUCOSE, CAPILLARY
Glucose-Capillary: 156 mg/dL — ABNORMAL HIGH (ref 70–99)
Glucose-Capillary: 156 mg/dL — ABNORMAL HIGH (ref 70–99)
Glucose-Capillary: 162 mg/dL — ABNORMAL HIGH (ref 70–99)
Glucose-Capillary: 167 mg/dL — ABNORMAL HIGH (ref 70–99)
Glucose-Capillary: 169 mg/dL — ABNORMAL HIGH (ref 70–99)
Glucose-Capillary: 178 mg/dL — ABNORMAL HIGH (ref 70–99)
Glucose-Capillary: 179 mg/dL — ABNORMAL HIGH (ref 70–99)
Glucose-Capillary: 182 mg/dL — ABNORMAL HIGH (ref 70–99)

## 2011-11-22 LAB — RENAL FUNCTION PANEL
Albumin: 1.9 g/dL — ABNORMAL LOW (ref 3.5–5.2)
BUN: 35 mg/dL — ABNORMAL HIGH (ref 6–23)
CO2: 18 mEq/L — ABNORMAL LOW (ref 19–32)
Calcium: 7.4 mg/dL — ABNORMAL LOW (ref 8.4–10.5)
Chloride: 97 mEq/L (ref 96–112)
Creatinine, Ser: 1.93 mg/dL — ABNORMAL HIGH (ref 0.50–1.10)
GFR calc Af Amer: 30 mL/min — ABNORMAL LOW (ref >90)
GFR calc non Af Amer: 26 mL/min — ABNORMAL LOW (ref >90)
Glucose, Bld: 186 mg/dL — ABNORMAL HIGH (ref 70–99)
Phosphorus: 3 mg/dL (ref 2.3–4.6)
Potassium: 4.5 mEq/L (ref 3.5–5.1)
Sodium: 125 mEq/L — ABNORMAL LOW (ref 135–145)

## 2011-11-22 LAB — PROTEIN ELECTROPHORESIS, SERUM
Albumin ELP: 49.7 % — ABNORMAL LOW (ref 55.8–66.1)
Alpha-1-Globulin: 10.7 % — ABNORMAL HIGH (ref 2.9–4.9)
Alpha-2-Globulin: 15.5 % — ABNORMAL HIGH (ref 7.1–11.8)
Beta 2: 6.6 % — ABNORMAL HIGH (ref 3.2–6.5)
Beta Globulin: 5.9 % (ref 4.7–7.2)
Gamma Globulin: 11.6 % (ref 11.1–18.8)
M-Spike, %: NOT DETECTED g/dL
Total Protein ELP: 5.1 g/dL — ABNORMAL LOW (ref 6.0–8.3)

## 2011-11-22 LAB — CORTISOL: Cortisol, Plasma: 18.4 ug/dL

## 2011-11-22 LAB — MAGNESIUM: Magnesium: 1.6 mg/dL (ref 1.5–2.5)

## 2011-11-22 MED ORDER — HEPARIN SODIUM (PORCINE) 5000 UNIT/ML IJ SOLN
5000.0000 [IU] | Freq: Three times a day (TID) | INTRAMUSCULAR | Status: DC
  Administered 2011-11-22 – 2011-11-25 (×10): 5000 [IU] via SUBCUTANEOUS
  Filled 2011-11-22 (×12): qty 1

## 2011-11-22 MED ORDER — SODIUM CHLORIDE 0.9 % IV SOLN
INTRAVENOUS | Status: DC
  Administered 2011-11-23 – 2011-11-25 (×2): via INTRAVENOUS
  Administered 2011-11-28: 20 mL/h via INTRAVENOUS

## 2011-11-22 MED ORDER — ONDANSETRON HCL 4 MG/2ML IJ SOLN
4.0000 mg | Freq: Once | INTRAMUSCULAR | Status: DC
  Filled 2011-11-22 (×2): qty 2

## 2011-11-22 MED ORDER — HYDROCORTISONE SOD SUCCINATE 100 MG IJ SOLR
50.0000 mg | Freq: Four times a day (QID) | INTRAMUSCULAR | Status: DC
  Administered 2011-11-22 – 2011-11-24 (×8): 50 mg via INTRAVENOUS
  Filled 2011-11-22: qty 2
  Filled 2011-11-22: qty 1
  Filled 2011-11-22 (×2): qty 2
  Filled 2011-11-22 (×5): qty 1
  Filled 2011-11-22: qty 2
  Filled 2011-11-22 (×2): qty 1

## 2011-11-22 NOTE — Progress Notes (Signed)
Subjective: Still on Neo, but alert, would like something to drink.  No significant abdominal pain, but still point to her abdomen above her umbilicus.  Objective: Vital signs in last 24 hours: Temp:  [97.7 F (36.5 C)-98.9 F (37.2 C)] 98.1 F (36.7 C) (06/25 0000) Pulse Rate:  [96-127] 106  (06/25 0915) Resp:  [20-35] 25  (06/25 0915) BP: (76-164)/(24-84) 101/41 mmHg (06/25 0915) SpO2:  [87 %-100 %] 94 % (06/25 0915) FiO2 (%):  [100 %] 100 % (06/25 0814) Weight:  [115.8 kg (255 lb 4.7 oz)] 115.8 kg (255 lb 4.7 oz) (06/25 0000) Last BM Date: 11/21/11  6255/752 recorded yesterday, 210 ml  Urine output last shift. NPO, sbp 100 range this AM, still tachycardic, afebrile. Na better, creatinine is also better, PH is better, good O2, S/P 1.5 liter paracentesis last pm, yellow fluid Intake/Output from previous day: 06/24 0701 - 06/25 0700 In: 6255.7 [I.V.:5905.7; IV Piggyback:350] Out: [id number] [Urine:752] Intake/Output this shift: Total I/O In: 536.9 [I.V.:436.9; IV Piggyback:100] Out: 210 [Urine:210]  General appearance: alert, cooperative and no distress on pressor, GI: s53ft, not really tender to palpation, just seems to be sore in her mid abdomen.  no rebound,  BS, hypoactive, no flatus, no BM since Sunday.6/23.  Lab Results:   Basename 11/21/11 0145 11/20/11 1228  WBC 10.9* 15.0*  HGB 9.8* 11.1*  HCT 30.0* 34.3*  PLT 316 360    BMET  Basename 11/22/11 0500 11/21/11 1200  NA 125* 122*  K 4.5 4.5  CL 97 92*  CO2 18* 18*  GLUCOSE 186* 173*  BUN 35* 40*  CREATININE 1.93* 2.53*  CALCIUM 7.4* 7.8*   PT/INR  Basename 11/19/11 2155  LABPROT 14.6  INR 1.12     Lab 11/22/11 0500 11/21/11 0145 11/20/11 2240 11/20/11 0531 11/19/11 1155  AST -- -- 21 15 18   ALT -- -- 12 10 11   ALKPHOS -- -- 65 78 76  BILITOT -- -- 0.3 0.4 0.4  PROT -- -- 5.1* 6.1 6.6  ALBUMIN 1.9* 2.2* 2.2* 2.9* 3.2*     Lipase     Component Value Date/Time   LIPASE 18 11/19/2011 1155       Studies/Results: Nm Pulmonary Perfusion  11/21/2011  *RADIOLOGY REPORT*  Clinical Data: Shortness of breath  NM PULMONARY PERFUSION PARTICULATE  Radiopharmaceutical: 3.4MILLI CURIE MAA TECHNETIUM TO 11M ALBUMIN AGGREGATED  Comparison: Chest x-ray from 11/21/2011  Findings: Multiplanar perfusion imaging shows no peripheral moderate or large perfusion defect in either lung.  Elevation of the right hemidiaphragm accounts for decreased activity at the right base.  Given the normal bilateral perfusion, ventilation imaging was not performed.  IMPRESSION: Normal bilateral lung perfusion.  Original Report Authenticated By: ERIC A. MANSELL, M.D.   US Renal Port  11/20/2011  *RADIOLOGY REPORT*  Clinical Data: Acute renal failure.  History diabetes hypertension.  RENAL/URINARY TRACT ULTRASOUND COMPLETE  Comparison:  Unenhanced CT abdomen and pelvis yesterday.  Findings:  Right Kidney:  No hydronephrosis.  Well-preserved cortex.  No shadowing calculi.  Normal size and parenchymal echotexture without focal abnormalities.  Approximately 10.7 cm in length.  Left Kidney:  Suboptimally imaged due to body habitus. No hydronephrosis.  Well-preserved cortex.  No shadowing calculi. Normal size and parenchymal echotexture without focal abnormalities.  Approximately 11.0 cm in length.  Bladder:  Decompressed by Foley catheter.  IMPRESSION: Normal examination with a caveat that the left kidney was not optimally imaged due to body habitus; however, the left kidney was normal in appearance on  the unenhanced CT yesterday.  Original Report Authenticated By: Deniece Portela, M.D.   US Paracentesis  11/21/2011  *RADIOLOGY REPORT*  Clinical Data: Abdominal pain, acute renal failure, gastric wall thickening, omental stranding, ascites; request is made for diagnostic and therapeutic  paracentesis up to 2 liters.  ULTRASOUND GUIDED DIAGNOSTIC AND THERAPEUTIC  PARACENTESIS  An ultrasound guided paracentesis was thoroughly discussed with  the patient and questions answered.  The benefits, risks, alternatives and complications were also discussed.  The patient understands and wishes to proceed with the procedure.  Written consent was obtained.  Ultrasound was performed to localize and mark an adequate pocket of fluid in the left mid to lower quadrant of the abdomen.  The area was then prepped and draped in the normal sterile fashion.  1% Lidocaine was used for local anesthesia.  Under ultrasound guidance a 19 gauge Yueh catheter was introduced.  Paracentesis was performed.  The catheter was removed and a dressing applied.  Complications:  none  Findings:  A total of approximately 1.5 liters of slightly turbid, yellow fluid was removed.  A fluid sample was  sent for laboratory analysis.  IMPRESSION: Successful ultrasound guided diagnostic and therapeutic paracentesis yielding 1.5 liters of ascites.  Read by: Rowe Robert, P.A.-C  Original Report Authenticated By: Trecia Rogers, M.D.   Dg Chest Port 1 View  11/22/2011  *RADIOLOGY REPORT*  Clinical Data: Follow up of atelectasis.  Coronary artery disease. Gastroesophageal reflux disease.  Diabetes.  PORTABLE CHEST - 1 VIEW  Comparison: 11/21/2011  Findings: Minimal motion degraded exam.  Right IJ central line unchanged tip at low SVC. Numerous leads and wires project over the chest.  Normal heart size.  Moderate hemidiaphragm elevation. No pleural effusion or pneumothorax.  Low lung volumes.  Resultant pulmonary interstitial prominence.  Patchy areas of lower lobe predominant atelectasis bilaterally.  The chin overlies the apices.  IMPRESSION: Low lung volumes with patchy areas of atelectasis.  Mildly motion degraded exam.  Original Report Authenticated By: Areta Haber, M.D.   Dg Chest Port 1 View  11/21/2011  *RADIOLOGY REPORT*  Clinical Data: Right CVP line placement.  PORTABLE CHEST - 1 VIEW  Comparison: 11/20/2011  Findings: Interval placement of a right central venous catheter with  tip in the low SVC region.  No pneumothorax.  Shallow inspiration.  Mild cardiac enlargement.  Pulmonary vascularity is normal for shallow inspiration.  Atelectasis in the lung bases.  IMPRESSION: Right central venous catheter with tip over the low SVC region.  No pneumothorax.  Otherwise stable appearance of the chest.  Original Report Authenticated By: Neale Burly, M.D.   Dg Chest Port 1 View  11/20/2011  *RADIOLOGY REPORT*  Clinical Data: Syncope  PORTABLE CHEST - 1 VIEW  Comparison: 03/17/2011  Findings: Low volumes.  Moderate cardiomegaly.  Bibasilar atelectasis.  No pneumothorax. No edema.  IMPRESSION: Bibasilar atelectasis.  Original Report Authenticated By: Jamas Lav, M.D.    Medications:    . cefTRIAXone (ROCEPHIN)  IV  1 g Intravenous Q24H  . insulin aspart  0-15 Units Subcutaneous Q4H  . levalbuterol  0.63 mg Nebulization Q6H  . levothyroxine  50 mcg Intravenous Q breakfast  . metronidazole  500 mg Intravenous Q8H  . sodium chloride  500 mL Intravenous Once  . sodium chloride  500 mL Intravenous Once  . sodium chloride  3 mL Intravenous Q12H  . DISCONTD: heparin subcutaneous  5,000 Units Subcutaneous Q12H  . DISCONTD: insulin aspart  0-15 Units  Subcutaneous TID WC  . DISCONTD: insulin aspart  0-5 Units Subcutaneous QHS    Assessment/Plan Abdominal pain with thickened gastric wall and omental stranding.  Concern for gastric CA. Endoscopy deferred with possible sepsis and renal failure Hypotension, still on Neo at 162mcg/min. Hyponatremia AODM PCM Obesity  Plan:  She does not have an exam consistent with acute abdomen. Labs are improving.  Will discuss with DR. Marlou Starks.     LOS: 3 days    Bonnie Roig 11/22/2011

## 2011-11-22 NOTE — Progress Notes (Signed)
Subjective: Feeling okay.  No abdominal pain.  Objective: Vital signs in last 24 hours: Temp:  [97.7 F (36.5 C)-99.1 F (37.3 C)] 99.1 F (37.3 C) (06/25 0800) Pulse Rate:  [91-127] 106  (06/25 1445) Resp:  [18-36] 18  (06/25 1445) BP: (76-154)/(24-69) 108/59 mmHg (06/25 1445) SpO2:  [87 %-100 %] 100 % (06/25 1445) Weight:  [115.8 kg (255 lb 4.7 oz)] 115.8 kg (255 lb 4.7 oz) (06/25 0000) Last BM Date: 11/21/11  Intake/Output from previous day: 06/24 0701 - 06/25 0700 In: 6255.7 [I.V.:5905.7; IV Piggyback:350] Out: [id number] [Urine:752] Intake/Output this shift: Total I/O In: 1349.7 [P.O.:50; I.V.:1199.7; IV Piggyback:100] Out: 620 [Urine:620]  General appearance: alert and weak appearing GI: soft, distended  Lab Results:  Basename 11/21/11 0145 11/20/11 1228 11/20/11 0531  WBC 10.9* 15.0* 11.9*  HGB 9.8* 11.1* 10.8*  HCT 30.0* 34.3* 33.6*  PLT 316 360 367   BMET  Basename 11/22/11 0500 11/21/11 1200 11/21/11 0145  NA 125* 122* 123*  K 4.5 4.5 5.3*  CL 97 92* 93*  CO2 18* 18* 20  GLUCOSE 186* 173* 140*  BUN 35* 40* 39*  CREATININE 1.93* 2.53* 2.81*  CALCIUM 7.4* 7.8* 7.8*   LFT  Basename 11/22/11 0500 11/20/11 2240  PROT -- 5.1*  ALBUMIN 1.9* --  AST -- 21  ALT -- 12  ALKPHOS -- 65  BILITOT -- 0.3  BILIDIR -- --  IBILI -- --   PT/INR  Basename 11/19/11 2155  LABPROT 14.6  INR 1.12   Hepatitis Panel No results found for this basename: HEPBSAG,HCVAB,HEPAIGM,HEPBIGM in the last 72 hours C-Diff No results found for this basename: CDIFFTOX:3 in the last 72 hours Fecal Lactopherrin No results found for this basename: FECLLACTOFRN in the last 72 hours  Studies/Results: Nm Pulmonary Perfusion  11/21/2011  *RADIOLOGY REPORT*  Clinical Data: Shortness of breath  NM PULMONARY PERFUSION PARTICULATE  Radiopharmaceutical: 3.4MILLI CURIE MAA TECHNETIUM TO 34M ALBUMIN AGGREGATED  Comparison: Chest x-ray from 11/21/2011  Findings: Multiplanar perfusion imaging  shows no peripheral moderate or large perfusion defect in either lung.  Elevation of the right hemidiaphragm accounts for decreased activity at the right base.  Given the normal bilateral perfusion, ventilation imaging was not performed.  IMPRESSION: Normal bilateral lung perfusion.  Original Report Authenticated By: ERIC A. MANSELL, M.D.   US Paracentesis  11/21/2011  *RADIOLOGY REPORT*  Clinical Data: Abdominal pain, acute renal failure, gastric wall thickening, omental stranding, ascites; request is made for diagnostic and therapeutic  paracentesis up to 2 liters.  ULTRASOUND GUIDED DIAGNOSTIC AND THERAPEUTIC  PARACENTESIS  An ultrasound guided paracentesis was thoroughly discussed with the patient and questions answered.  The benefits, risks, alternatives and complications were also discussed.  The patient understands and wishes to proceed with the procedure.  Written consent was obtained.  Ultrasound was performed to localize and mark an adequate pocket of fluid in the left mid to lower quadrant of the abdomen.  The area was then prepped and draped in the normal sterile fashion.  1% Lidocaine was used for local anesthesia.  Under ultrasound guidance a 19 gauge Yueh catheter was introduced.  Paracentesis was performed.  The catheter was removed and a dressing applied.  Complications:  none  Findings:  A total of approximately 1.5 liters of slightly turbid, yellow fluid was removed.  A fluid sample was  sent for laboratory analysis.  IMPRESSION: Successful ultrasound guided diagnostic and therapeutic paracentesis yielding 1.5 liters of ascites.  Read by: Rowe Robert, P.A.-C  Original  Report Authenticated By: Trecia Rogers, M.D.   Dg Chest Port 1 View  11/22/2011  *RADIOLOGY REPORT*  Clinical Data: Follow up of atelectasis.  Coronary artery disease. Gastroesophageal reflux disease.  Diabetes.  PORTABLE CHEST - 1 VIEW  Comparison: 11/21/2011  Findings: Minimal motion degraded exam.  Right IJ central  line unchanged tip at low SVC. Numerous leads and wires project over the chest.  Normal heart size.  Moderate hemidiaphragm elevation. No pleural effusion or pneumothorax.  Low lung volumes.  Resultant pulmonary interstitial prominence.  Patchy areas of lower lobe predominant atelectasis bilaterally.  The chin overlies the apices.  IMPRESSION: Low lung volumes with patchy areas of atelectasis.  Mildly motion degraded exam.  Original Report Authenticated By: Areta Haber, M.D.   Dg Chest Port 1 View  11/21/2011  *RADIOLOGY REPORT*  Clinical Data: Right CVP line placement.  PORTABLE CHEST - 1 VIEW  Comparison: 11/20/2011  Findings: Interval placement of a right central venous catheter with tip in the low SVC region.  No pneumothorax.  Shallow inspiration.  Mild cardiac enlargement.  Pulmonary vascularity is normal for shallow inspiration.  Atelectasis in the lung bases.  IMPRESSION: Right central venous catheter with tip over the low SVC region.  No pneumothorax.  Otherwise stable appearance of the chest.  Original Report Authenticated By: Neale Burly, M.D.    Medications:  Scheduled:   . cefTRIAXone (ROCEPHIN)  IV  1 g Intravenous Q24H  . heparin subcutaneous  5,000 Units Subcutaneous Q8H  . hydrocortisone sod succinate (SOLU-CORTEF) injection  50 mg Intravenous Q6H  . insulin aspart  0-15 Units Subcutaneous Q4H  . levalbuterol  0.63 mg Nebulization Q6H  . levothyroxine  50 mcg Intravenous Q breakfast  . metronidazole  500 mg Intravenous Q8H  . sodium chloride  3 mL Intravenous Q12H  . DISCONTD: insulin aspart  0-15 Units Subcutaneous TID WC  . DISCONTD: insulin aspart  0-5 Units Subcutaneous QHS  . DISCONTD: sodium chloride  500 mL Intravenous Once  . DISCONTD: sodium chloride  500 mL Intravenous Once   Continuous:   . sodium chloride    . phenylephrine (NEO-SYNEPHRINE) Adult infusion 30 mcg/min (11/22/11 1448)  . DISCONTD: sodium chloride 1,000 mL (11/22/11 0541)  . DISCONTD:  phenylephrine (NEO-SYNEPHRINE) Adult infusion 200 mcg/min (11/21/11 1923)    Assessment/Plan: 1) Abnormal CT scan. 2) Sepsis.   Clinically the patient appears to be improving.  Pressors are off at this time.  I will continue to monitor and the plan is for an EGD this coming Thursday or Friday if she is stable.  Plan: 1) Continue supportive care.  LOS: 3 days   Janda Cargo D 11/22/2011, 3:54 PM

## 2011-11-22 NOTE — Progress Notes (Signed)
Name: Amanda Cain MRN: [id number] DOB: 12/28/1945    LOS: 3  PULMONARY / CRITICAL CARE MEDICINE NOTE  HPI:   66 y/o AAF with DM, CAD, HTN, obesity, hypothyroidism and GERD. Admitted 6/22  for evaluation and management of lower abdominal pain and vomiting for 3 to 4 days.  CT scan of the abdomen showed gastric wall thickening, small to moderate ascites and omental stranding, cholelithiasis and 3 mm cystic duct stone, without evidence of acute cholecystitis. At admission she was also found to have a creatinine of 2.59. General surgery, GI and nephrology consults were called. Afternoon of 6/23  the patient experienced a syncopal episode  And then remained hypotensive and oliguric. Critical care consult was called (6/23) to help in the evaluation and management.   Lines and tubes: 6/24 rt i j cvl>>  Micro: 6/24 bc x 2>>>1/2 GPR>>> 6/24 uc>>> 6/24 Peritoneal fluid> no org seen >  Abx: 6/24 rocephin (?uti)>>> 6/24 flagyl (diarrhea) >>  Vital Signs: Temp:  [97.7 F (36.5 C)-98.9 F (37.2 C)] 98.1 F (36.7 C) (06/25 0000) Pulse Rate:  [96-127] 106  (06/25 0915) Resp:  [20-35] 25  (06/25 0915) BP: (76-164)/(24-84) 101/41 mmHg (06/25 0915) SpO2:  [87 %-100 %] 94 % (06/25 0915) FiO2 (%):  [100 %] 100 % (06/25 0814) Weight:  [255 lb 4.7 oz (115.8 kg)] 255 lb 4.7 oz (115.8 kg) (06/25 0000) FIO2 4lpm    Intake/Output Summary (Last 24 hours) at 11/22/11 0939 Last data filed at 11/22/11 0928  Gross per 24 hour  Intake 5872.64 ml  Output    942 ml  Net 4930.64 ml    CVP:  [10 mmHg-19 mmHg] 16 mmHg   Physical Examination: General:  Awake, alert, no acute distress. Anxious. Neuro:  Oriented x 3. No focal neurological deficits. HEENT:  PERRLA, pink conjunctivae, dry mucous membranes Neck:  Supple, no JVD  . Rt I J cvl noted. Cardiovascular:  RRR, no M/R/G. Brief episode of svt with cvl insertion 6/24 Lungs:  Diminished breath sounds bilaterally. Clear to auscultation. upper  airway wheezing. Abdomen:  Obese, prominent abdomen. Soft, nontender, distended, bowel sounds hypoactive Musculoskeletal:  Moves all extremities, no pedal edema Skin:  No rash   Lab 11/20/11 1623 11/20/11 1241  PHART 7.316* 7.286*  PCO2ART 38.5 38.9  PO2ART 92.7 140.0*  HCO3 19.1* 18.0*  O2SAT 98.2 98.3    Lab 11/20/11 1708 11/20/11 1228 11/19/11 2155  TROPONINI -- <0.30 --  LATICACIDVEN 0.9 -- --  PROBNP -- -- 130.6*    Lab 11/22/11 0500 11/21/11 1200 11/21/11 0145  NA 125* 122* 123*  K 4.5 4.5 5.3*  CL 97 92* 93*  CO2 18* 18* 20  BUN 35* 40* 39*  CREATININE 1.93* 2.53* 2.81*  GLUCOSE 186* 173* 140*    Lab 11/21/11 0145 11/20/11 1228 11/20/11 0531  HGB 9.8* 11.1* 10.8*  HCT 30.0* 34.3* 33.6*  WBC 10.9* 15.0* 11.9*  PLT 316 360 367       Lab 11/20/11 2240 11/20/11 0531 11/19/11 1155  AST 21 15 18   ALT 12 10 11   ALKPHOS 65 78 76  BILITOT 0.3 0.4 0.4  PROT 5.1* 6.1 6.6  ALBUMIN 2.2* 2.9* 3.2*     Lab 11/21/11 0006 11/20/11 2302 11/20/11 1931 11/20/11 1633 11/20/11 1237  GLUCAP 146* 141* 152* 207* 235*   Nm Pulmonary Perfusion  11/21/2011  *RADIOLOGY REPORT*  Clinical Data: Shortness of breath  NM PULMONARY PERFUSION PARTICULATE  Radiopharmaceutical: 3.4MILLI CURIE MAA TECHNETIUM TO 73M  ALBUMIN AGGREGATED  Comparison: Chest x-ray from 11/21/2011  Findings: Multiplanar perfusion imaging shows no peripheral moderate or large perfusion defect in either lung.  Elevation of the right hemidiaphragm accounts for decreased activity at the right base.  Given the normal bilateral perfusion, ventilation imaging was not performed.  IMPRESSION: Normal bilateral lung perfusion.  Original Report Authenticated By: ERIC A. MANSELL, M.D.   US Renal Port  11/20/2011  *RADIOLOGY REPORT*  Clinical Data: Acute renal failure.  History diabetes hypertension.  RENAL/URINARY TRACT ULTRASOUND COMPLETE  Comparison:  Unenhanced CT abdomen and pelvis yesterday.  Findings:  Right Kidney:  No  hydronephrosis.  Well-preserved cortex.  No shadowing calculi.  Normal size and parenchymal echotexture without focal abnormalities.  Approximately 10.7 cm in length.  Left Kidney:  Suboptimally imaged due to body habitus. No hydronephrosis.  Well-preserved cortex.  No shadowing calculi. Normal size and parenchymal echotexture without focal abnormalities.  Approximately 11.0 cm in length.  Bladder:  Decompressed by Foley catheter.  IMPRESSION: Normal examination with a caveat that the left kidney was not optimally imaged due to body habitus; however, the left kidney was normal in appearance on the unenhanced CT yesterday.  Original Report Authenticated By: Deniece Portela, M.D.   US Paracentesis  11/21/2011  *RADIOLOGY REPORT*  Clinical Data: Abdominal pain, acute renal failure, gastric wall thickening, omental stranding, ascites; request is made for diagnostic and therapeutic  paracentesis up to 2 liters.  ULTRASOUND GUIDED DIAGNOSTIC AND THERAPEUTIC  PARACENTESIS  An ultrasound guided paracentesis was thoroughly discussed with the patient and questions answered.  The benefits, risks, alternatives and complications were also discussed.  The patient understands and wishes to proceed with the procedure.  Written consent was obtained.  Ultrasound was performed to localize and mark an adequate pocket of fluid in the left mid to lower quadrant of the abdomen.  The area was then prepped and draped in the normal sterile fashion.  1% Lidocaine was used for local anesthesia.  Under ultrasound guidance a 19 gauge Yueh catheter was introduced.  Paracentesis was performed.  The catheter was removed and a dressing applied.  Complications:  none  Findings:  A total of approximately 1.5 liters of slightly turbid, yellow fluid was removed.  A fluid sample was  sent for laboratory analysis.  IMPRESSION: Successful ultrasound guided diagnostic and therapeutic paracentesis yielding 1.5 liters of ascites.  Read by: Rowe Robert, P.A.-C  Original Report Authenticated By: Trecia Rogers, M.D.   Dg Chest Port 1 View  11/22/2011  *RADIOLOGY REPORT*  Clinical Data: Follow up of atelectasis.  Coronary artery disease. Gastroesophageal reflux disease.  Diabetes.  PORTABLE CHEST - 1 VIEW  Comparison: 11/21/2011  Findings: Minimal motion degraded exam.  Right IJ central line unchanged tip at low SVC. Numerous leads and wires project over the chest.  Normal heart size.  Moderate hemidiaphragm elevation. No pleural effusion or pneumothorax.  Low lung volumes.  Resultant pulmonary interstitial prominence.  Patchy areas of lower lobe predominant atelectasis bilaterally.  The chin overlies the apices.  IMPRESSION: Low lung volumes with patchy areas of atelectasis.  Mildly motion degraded exam.  Original Report Authenticated By: Areta Haber, M.D.   Dg Chest Port 1 View  11/21/2011  *RADIOLOGY REPORT*  Clinical Data: Right CVP line placement.  PORTABLE CHEST - 1 VIEW  Comparison: 11/20/2011  Findings: Interval placement of a right central venous catheter with tip in the low SVC region.  No pneumothorax.  Shallow inspiration.  Mild cardiac  enlargement.  Pulmonary vascularity is normal for shallow inspiration.  Atelectasis in the lung bases.  IMPRESSION: Right central venous catheter with tip over the low SVC region.  No pneumothorax.  Otherwise stable appearance of the chest.  Original Report Authenticated By: Neale Burly, M.D.   Dg Chest Port 1 View  11/20/2011  *RADIOLOGY REPORT*  Clinical Data: Syncope  PORTABLE CHEST - 1 VIEW  Comparison: 03/17/2011  Findings: Low volumes.  Moderate cardiomegaly.  Bibasilar atelectasis.  No pneumothorax. No edema.  IMPRESSION: Bibasilar atelectasis.  Original Report Authenticated By: Jamas Lav, M.D.    Principal Problem:  *Abdominal pain Active Problems:  Obesity (BMI 30.0-34.9)  Hyponatremia  Anemia  Acute kidney injury  Moderate protein-calorie malnutrition  Diabetes  mellitus  HTN (hypertension)  Hypothyroidism  Dyslipidemia  Syncope and collapse  Shock   ASSESSMENT AND PLAN  66 year old female with PMH relevant for DM, HTN, CAD, obesity and hypothyroidism. Presents with abdominal pain and vomiting and found to be in acute renal failure at admission. A CT scan of the abdomen showed gastric wall thickening, small to moderate ascites and omental stranding. It also showed cholelithiasis and 3 mm cystic duct stone with no CT evidence of acute cholecystitis. Today she had a syncopal episode and developed persistent hypotension and anuria. She has responded well to fluid resuscitation (close to 6 L positive since admission) but remains anuric. Her acute renal failure is likely secondary to ATN due to volume depletion and also medication related including ARB, diuretic and NSAIDs. Renal US did not showed hydronephrosis.  Nephrology input appreciated. Currently no indication for emergent dialysis. With persistent hypotension and elevated WBC of 15 at admission in the setting of abdominal pain, vomiting and an abnormal CT of the abdomen I'm concerned for infection of intraabdominal source. Her abdomen is benign on exam but she is a diabetic chronically ill lady and may not be able to mount a full inflammatroy response. Of note, she had an echocardiogram today that showed normal LVEF with impaired relaxation diastolic dysfunction. Chest X ray did not show any acute infiltrates but showed small volumes with atelectasis.  Plan:  Hypotension, possible sepsis of abdominal origin.   S/p paracentesis 6/24 with 1.5 L fluid removed.   - pan cultured - Rocephin and Flagyl for possible intraabdominal infection - Continue cautious IV fluid resuscitation to keep MAP of >65 - pressors as needed  - follow peritoneal fluid - 6/25 - stress steroids  Acute renal failure / hyperkalemia / Hyponatremia Renal US 6/23 > negative - Nephrology  Following.  UOP improving 6/25 - Metformin  and antihypertensives on hold - Will continue IVF's  - Strict I&O's     Mild hypoxemia with ATX from abd distension/ 02 dep - Likely secondary to atelectasis. Possibly some fluid overload.  No acute infiltrates on X ray - xopenex  DM Hypothyroidism - Continue insulin sliding scale - synthroid  DVT prophylaxis - Heparin resumed 6/25      Noe Gens, NP-C [facility] Pgr: [phone] or [phone]  The patient is critically ill with multiple organ systems failure and requires high complexity decision making for assessment and support, frequent evaluation and titration of therapies, application of advanced monitoring technologies and extensive interpretation of multiple databases. Critical Care Time devoted to patient care services described in this note is 45 minutes.   Christinia Gully, MD Pulmonary and [facility] [phone]

## 2011-11-22 NOTE — Progress Notes (Addendum)
Patient ID: Amanda Cain, female   DOB: 05/24/1946, 66 y.o.   MRN: [id number] S:feels a little better today but still SOB O:BP 99/47  Pulse 106  Temp 99.1 F (37.3 C) (Oral)  Resp 21  Ht 4\' 11"  (1.499 m)  Wt 115.8 kg (255 lb 4.7 oz)  BMI 51.56 kg/m2  SpO2 100%  Intake/Output Summary (Last 24 hours) at 11/22/11 1329 Last data filed at 11/22/11 1300  Gross per 24 hour  Intake 5994.2 ml  Output   1225 ml  Net 4769.2 ml   Weight change: 4.5 kg (9 lb 14.7 oz) [id number], ill-appearing AAF with mild resp distress [id number] Resp:decreased BS at bases [id number], tense, mild tenderness to deep palpation Ext:2+edema   Lab 11/22/11 0500 11/21/11 1200 11/21/11 0145 11/20/11 2240 11/20/11 1228 11/20/11 0531 11/19/11 2155 11/19/11 1155  NA 125* 122* 123* 123* 122* 122* -- 123*  K 4.5 4.5 5.3* 5.4* 4.5 4.2 -- 4.0  CL 97 92* 93* 92* 88* 89* -- 86*  CO2 18* 18* 20 19 19 20  -- 26  GLUCOSE 186* 173* 140* 153* 237* 204* -- 170*  BUN 35* 40* 39* 37* 37* 36* -- 35*  CREATININE 1.93* 2.53* 2.81* 2.86* 3.01* 2.58* -- 2.59*  ALBUMIN 1.9* -- 2.2* 2.2* -- 2.9* -- 3.2*  CALCIUM 7.4* 7.8* 7.8* 7.9* 8.8 8.9 -- 9.1  PHOS 3.0 -- 3.7 -- -- -- 3.3 --  AST -- -- -- 21 -- 15 -- 18  ALT -- -- -- 12 -- 10 -- 11   Liver Function Tests:  Lab 11/22/11 0500 11/21/11 0145 11/20/11 2240 11/20/11 0531 11/19/11 1155  AST -- -- 21 15 18   ALT -- -- 12 10 11   ALKPHOS -- -- 65 78 76  BILITOT -- -- 0.3 0.4 0.4  PROT -- -- 5.1* 6.1 6.6  ALBUMIN 1.9* 2.2* 2.2* -- --    Lab 11/19/11 1155  LIPASE 18  AMYLASE --   No results found for this basename: AMMONIA:3 in the last 168 hours CBC:  Lab 11/21/11 0145 11/20/11 1228 11/20/11 0531 11/19/11 1155  WBC 10.9* 15.0* 11.9* --  NEUTROABS -- -- -- 6.2  HGB 9.8* 11.1* 10.8* --  HCT 30.0* 34.3* 33.6* --  MCV 81.5 81.5 81.0 81.3  PLT 316 360 367 --   Cardiac Enzymes:  Lab 11/20/11 1228  CKTOTAL 117  CKMB 4.1*  CKMBINDEX --  TROPONINI <0.30    CBG:  Lab 11/22/11 1209 11/22/11 0730 11/22/11 0333 11/22/11 0001 11/21/11 2036  GLUCAP 169* 167* 179* 182* 173*    Iron Studies: No results found for this basename: IRON,TIBC,TRANSFERRIN,FERRITIN in the last 72 hours Studies/Results: Nm Pulmonary Perfusion  11/21/2011  *RADIOLOGY REPORT*  Clinical Data: Shortness of breath  NM PULMONARY PERFUSION PARTICULATE  Radiopharmaceutical: 3.4MILLI CURIE MAA TECHNETIUM TO 63M ALBUMIN AGGREGATED  Comparison: Chest x-ray from 11/21/2011  Findings: Multiplanar perfusion imaging shows no peripheral moderate or large perfusion defect in either lung.  Elevation of the right hemidiaphragm accounts for decreased activity at the right base.  Given the normal bilateral perfusion, ventilation imaging was not performed.  IMPRESSION: Normal bilateral lung perfusion.  Original Report Authenticated By: ERIC A. MANSELL, M.D.   US Renal Port  11/20/2011  *RADIOLOGY REPORT*  Clinical Data: Acute renal failure.  History diabetes hypertension.  RENAL/URINARY TRACT ULTRASOUND COMPLETE  Comparison:  Unenhanced CT abdomen and pelvis yesterday.  Findings:  Right Kidney:  No hydronephrosis.  Well-preserved cortex.  No shadowing calculi.  Normal size and  parenchymal echotexture without focal abnormalities.  Approximately 10.7 cm in length.  Left Kidney:  Suboptimally imaged due to body habitus. No hydronephrosis.  Well-preserved cortex.  No shadowing calculi. Normal size and parenchymal echotexture without focal abnormalities.  Approximately 11.0 cm in length.  Bladder:  Decompressed by Foley catheter.  IMPRESSION: Normal examination with a caveat that the left kidney was not optimally imaged due to body habitus; however, the left kidney was normal in appearance on the unenhanced CT yesterday.  Original Report Authenticated By: Deniece Portela, M.D.   US Paracentesis  11/21/2011  *RADIOLOGY REPORT*  Clinical Data: Abdominal pain, acute renal failure, gastric wall thickening,  omental stranding, ascites; request is made for diagnostic and therapeutic  paracentesis up to 2 liters.  ULTRASOUND GUIDED DIAGNOSTIC AND THERAPEUTIC  PARACENTESIS  An ultrasound guided paracentesis was thoroughly discussed with the patient and questions answered.  The benefits, risks, alternatives and complications were also discussed.  The patient understands and wishes to proceed with the procedure.  Written consent was obtained.  Ultrasound was performed to localize and mark an adequate pocket of fluid in the left mid to lower quadrant of the abdomen.  The area was then prepped and draped in the normal sterile fashion.  1% Lidocaine was used for local anesthesia.  Under ultrasound guidance a 19 gauge Yueh catheter was introduced.  Paracentesis was performed.  The catheter was removed and a dressing applied.  Complications:  none  Findings:  A total of approximately 1.5 liters of slightly turbid, yellow fluid was removed.  A fluid sample was  sent for laboratory analysis.  IMPRESSION: Successful ultrasound guided diagnostic and therapeutic paracentesis yielding 1.5 liters of ascites.  Read by: Rowe Robert, P.A.-C  Original Report Authenticated By: Trecia Rogers, M.D.   Dg Chest Port 1 View  11/22/2011  *RADIOLOGY REPORT*  Clinical Data: Follow up of atelectasis.  Coronary artery disease. Gastroesophageal reflux disease.  Diabetes.  PORTABLE CHEST - 1 VIEW  Comparison: 11/21/2011  Findings: Minimal motion degraded exam.  Right IJ central line unchanged tip at low SVC. Numerous leads and wires project over the chest.  Normal heart size.  Moderate hemidiaphragm elevation. No pleural effusion or pneumothorax.  Low lung volumes.  Resultant pulmonary interstitial prominence.  Patchy areas of lower lobe predominant atelectasis bilaterally.  The chin overlies the apices.  IMPRESSION: Low lung volumes with patchy areas of atelectasis.  Mildly motion degraded exam.  Original Report Authenticated By: Areta Haber, M.D.   Dg Chest Port 1 View  11/21/2011  *RADIOLOGY REPORT*  Clinical Data: Right CVP line placement.  PORTABLE CHEST - 1 VIEW  Comparison: 11/20/2011  Findings: Interval placement of a right central venous catheter with tip in the low SVC region.  No pneumothorax.  Shallow inspiration.  Mild cardiac enlargement.  Pulmonary vascularity is normal for shallow inspiration.  Atelectasis in the lung bases.  IMPRESSION: Right central venous catheter with tip over the low SVC region.  No pneumothorax.  Otherwise stable appearance of the chest.  Original Report Authenticated By: Neale Burly, M.D.   Dg Chest Port 1 View  11/20/2011  *RADIOLOGY REPORT*  Clinical Data: Syncope  PORTABLE CHEST - 1 VIEW  Comparison: 03/17/2011  Findings: Low volumes.  Moderate cardiomegaly.  Bibasilar atelectasis.  No pneumothorax. No edema.  IMPRESSION: Bibasilar atelectasis.  Original Report Authenticated By: Jamas Lav, M.D.      . cefTRIAXone (ROCEPHIN)  IV  1 g Intravenous Q24H  .  hydrocortisone sod succinate (SOLU-CORTEF) injection  50 mg Intravenous Q6H  . insulin aspart  0-15 Units Subcutaneous Q4H  . levalbuterol  0.63 mg Nebulization Q6H  . levothyroxine  50 mcg Intravenous Q breakfast  . metronidazole  500 mg Intravenous Q8H  . sodium chloride  500 mL Intravenous Once  . sodium chloride  500 mL Intravenous Once  . sodium chloride  3 mL Intravenous Q12H  . DISCONTD: heparin subcutaneous  5,000 Units Subcutaneous Q12H  . DISCONTD: insulin aspart  0-15 Units Subcutaneous TID WC  . DISCONTD: insulin aspart  0-5 Units Subcutaneous QHS    BMET    Component Value Date/Time   NA 125* 11/22/2011 0500   K 4.5 11/22/2011 0500   CL 97 11/22/2011 0500   CO2 18* 11/22/2011 0500   GLUCOSE 186* 11/22/2011 0500   BUN 35* 11/22/2011 0500   CREATININE 1.93* 11/22/2011 0500   CALCIUM 7.4* 11/22/2011 0500   GFRNONAA 26* 11/22/2011 0500   GFRAA 30* 11/22/2011 0500   CBC    Component Value Date/Time   WBC  10.9* 11/21/2011 0145   RBC 3.68* 11/21/2011 0145   HGB 9.8* 11/21/2011 0145   HCT 30.0* 11/21/2011 0145   PLT 316 11/21/2011 0145   MCV 81.5 11/21/2011 0145   MCH 26.6 11/21/2011 0145   MCHC 32.7 11/21/2011 0145   RDW 16.2* 11/21/2011 0145   LYMPHSABS 1.6 11/19/2011 1155   MONOABS 0.9 11/19/2011 1155   EOSABS 0.4 11/19/2011 1155   BASOSABS 0.0 11/19/2011 1155   Assessment: 1. AKI/oliguric ARF 2. Hyponatremia (improved) 3. SIRS 4. Anemia 5. Anasarca (not related to nephrotic syndrome) 6. DM- per primary svc 7. Abd pain 8. Obesity   Plan:  1. creatinine has trended down but with only slight increase in UOP.  May be dilutional.  Still requiring pressors. Agree that initial insult likely ischemic ATN due to volume depletion in setting of ARB and diuretics. Low FeNa (off diuretics). More third spacing and peripheral edema so will discontinue IVF's, and follow UOP and serum creatinine.  May need to add lasix 2. Await paracentesis results 3. Wean pressors as able 4. Continue with surgical eval  Yordin Rhoda A  SBP by paracentesis likely cause of abd pain but unclear source.  On abx and surgery following.

## 2011-11-23 ENCOUNTER — Inpatient Hospital Stay (HOSPITAL_COMMUNITY): Payer: Medicare Other

## 2011-11-23 DIAGNOSIS — R52 Pain, unspecified: Secondary | ICD-10-CM

## 2011-11-23 DIAGNOSIS — R109 Unspecified abdominal pain: Secondary | ICD-10-CM

## 2011-11-23 LAB — GLUCOSE, CAPILLARY
Glucose-Capillary: 157 mg/dL — ABNORMAL HIGH (ref 70–99)
Glucose-Capillary: 157 mg/dL — ABNORMAL HIGH (ref 70–99)
Glucose-Capillary: 163 mg/dL — ABNORMAL HIGH (ref 70–99)
Glucose-Capillary: 156 mg/dL — ABNORMAL HIGH (ref 70–99)
Glucose-Capillary: 161 mg/dL — ABNORMAL HIGH (ref 70–99)
Glucose-Capillary: 190 mg/dL — ABNORMAL HIGH (ref 70–99)

## 2011-11-23 LAB — CBC
HCT: 26.9 % — ABNORMAL LOW (ref 36.0–46.0)
Hemoglobin: 8.9 g/dL — ABNORMAL LOW (ref 12.0–15.0)
MCH: 26.4 pg (ref 26.0–34.0)
MCHC: 33.1 g/dL (ref 30.0–36.0)
MCV: 79.8 fL (ref 78.0–100.0)
Platelets: 382 10*3/uL (ref 150–400)
RBC: 3.37 MIL/uL — ABNORMAL LOW (ref 3.87–5.11)
RDW: 16.6 % — ABNORMAL HIGH (ref 11.5–15.5)
WBC: 13.5 10*3/uL — ABNORMAL HIGH (ref 4.0–10.5)

## 2011-11-23 LAB — BASIC METABOLIC PANEL
BUN: 28 mg/dL — ABNORMAL HIGH (ref 6–23)
CO2: 19 mEq/L (ref 19–32)
Calcium: 7.8 mg/dL — ABNORMAL LOW (ref 8.4–10.5)
Chloride: 98 mEq/L (ref 96–112)
Creatinine, Ser: 1.37 mg/dL — ABNORMAL HIGH (ref 0.50–1.10)
GFR calc Af Amer: 45 mL/min — ABNORMAL LOW (ref >90)
GFR calc non Af Amer: 39 mL/min — ABNORMAL LOW (ref >90)
Glucose, Bld: 169 mg/dL — ABNORMAL HIGH (ref 70–99)
Potassium: 4.4 mEq/L (ref 3.5–5.1)
Sodium: 126 mEq/L — ABNORMAL LOW (ref 135–145)

## 2011-11-23 MED ORDER — ALUM & MAG HYDROXIDE-SIMETH 200-200-20 MG/5ML PO SUSP
30.0000 mL | Freq: Four times a day (QID) | ORAL | Status: DC | PRN
Start: 2011-11-23 — End: 2011-11-30
  Administered 2011-11-23 – 2011-11-29 (×8): 30 mL via ORAL
  Filled 2011-11-23 (×8): qty 30

## 2011-11-23 MED ORDER — ONDANSETRON HCL 4 MG/2ML IJ SOLN
4.0000 mg | Freq: Three times a day (TID) | INTRAMUSCULAR | Status: DC | PRN
  Administered 2011-11-27 – 2011-11-29 (×4): 4 mg via INTRAVENOUS
  Filled 2011-11-23 (×4): qty 2

## 2011-11-23 MED ORDER — LORAZEPAM 2 MG/ML IJ SOLN
0.5000 mg | Freq: Once | INTRAMUSCULAR | Status: AC
  Administered 2011-11-23: 0.5 mg via INTRAVENOUS
  Filled 2011-11-23: qty 1

## 2011-11-23 MED ORDER — FUROSEMIDE 10 MG/ML IJ SOLN
20.0000 mg | Freq: Once | INTRAMUSCULAR | Status: AC
  Administered 2011-11-23: 20 mg via INTRAVENOUS
  Filled 2011-11-23: qty 2

## 2011-11-23 MED ORDER — ONDANSETRON HCL 4 MG/2ML IJ SOLN
INTRAMUSCULAR | Status: AC
  Administered 2011-11-23: 4 mg
  Filled 2011-11-23: qty 2

## 2011-11-23 NOTE — Progress Notes (Signed)
Subjective: Since I last evaluated the patient, she seems to be doing somewhat better.  Had 1.5 liters of ascitic fluid drawn. Awaiting an EGD that was postponed due to a sudden decline in her condition due to sepsis and AKI. She is currently sitting up in a chair, awake and alert and in NAD. Her sister is at bedside. Denies any abdominal pain.   Objective: Vital signs in last 24 hours: Temp:  [97.7 F (36.5 C)-99.2 F (37.3 C)] 99.1 F (37.3 C) (06/26 1600) Pulse Rate:  [56-133] 109  (06/26 1400) Resp:  [17-34] 26  (06/26 1400) BP: (95-151)/(39-99) 124/64 mmHg (06/26 1400) SpO2:  [91 %-100 %] 98 % (06/26 1552) Weight:  [120.7 kg (266 lb 1.5 oz)] 120.7 kg (266 lb 1.5 oz) (06/26 0400) Last BM Date: 11/23/11  Intake/Output from previous day: 06/25 0701 - 06/26 0700 In: 2377.7 [P.O.:490; I.V.:1637.7; IV Piggyback:250] Out: [id number] [Urine:1625] Intake/Output this shift: Total I/O In: 320 [P.O.:100; I.V.:20; IV Piggyback:200] Out: 250 [Urine:250]  General appearance: alert, cooperative, appears stated age, fatigued and no distress Resp: clear to auscultation but decreased at the bases bilaterally Cardio: regular rate and rhythm, S1, S2 normal, no murmur, click, rub or gallop GI: soft, non-tender; bowel sounds hypoactive; no masses,  no organomegaly Extremities: extremities with 1+ edema  Lab Results:  Basename 11/23/11 0430 11/21/11 0145  WBC 13.5* 10.9*  HGB 8.9* 9.8*  HCT 26.9* 30.0*  PLT 382 316   BMET  Basename 11/23/11 0430 11/22/11 0500 11/21/11 1200  NA 126* 125* 122*  K 4.4 4.5 4.5  CL 98 97 92*  CO2 19 18* 18*  GLUCOSE 169* 186* 173*  BUN 28* 35* 40*  CREATININE 1.37* 1.93* 2.53*  CALCIUM 7.8* 7.4* 7.8*   LFT  Basename 11/22/11 0500 11/20/11 2240  PROT -- 5.1*  ALBUMIN 1.9* --  AST -- 21  ALT -- 12  ALKPHOS -- 65  BILITOT -- 0.3  BILIDIR -- --  IBILI -- --   Studies/Results: Dg Chest Port 1 View  11/23/2011  *RADIOLOGY REPORT*  Clinical Data:  Atelectasis.  PORTABLE CHEST - 1 VIEW  Comparison: 11/22/2011.  Findings: Right IJ central line tip projects over the SVC.  Heart size stable.  Lungs are low in volume with mild diffuse bilateral air space disease, as before.  IMPRESSION: Low lung volumes mild diffuse bilateral air space disease, possibly due to edema.  Original Report Authenticated By: Luretha Rued, M.D.   Dg Chest Port 1 View  11/22/2011  *RADIOLOGY REPORT*  Clinical Data: Follow up of atelectasis.  Coronary artery disease. Gastroesophageal reflux disease.  Diabetes.  PORTABLE CHEST - 1 VIEW  Comparison: 11/21/2011  Findings: Minimal motion degraded exam.  Right IJ central line unchanged tip at low SVC. Numerous leads and wires project over the chest.  Normal heart size.  Moderate hemidiaphragm elevation. No pleural effusion or pneumothorax.  Low lung volumes.  Resultant pulmonary interstitial prominence.  Patchy areas of lower lobe predominant atelectasis bilaterally.  The chin overlies the apices.  IMPRESSION: Low lung volumes with patchy areas of atelectasis.  Mildly motion degraded exam.  Original Report Authenticated By: Areta Haber, M.D.    Medications: I have reviewed the patient's current medications.  Assessment/Plan: 1) Abnormal CT scan with gastric wall thickening and ascites with omental stranding: Dr. Benson Norway is on call this weekend and plans to do the EGD once the patient is a little more stable.  2) Sepsis/AKI; will follow up on the results of  the ascitic fluid.  3) HTN  4) DM.  5) Anemia.  6) PCM-albumin of 1.9  LOS: 4 days   Bethanne Mule 11/23/2011, 5:49 PM

## 2011-11-23 NOTE — Progress Notes (Signed)
Patient ID: Amanda Cain, female   DOB: 05-21-1946, 66 y.o.   MRN: [id number] S:feels better O:BP 122/64  Pulse 106  Temp 98.2 F (36.8 C) (Oral)  Resp 21  Ht 4\' 11"  (1.499 m)  Wt 120.7 kg (266 lb 1.5 oz)  BMI 53.74 kg/m2  SpO2 99%  Intake/Output Summary (Last 24 hours) at 11/23/11 1259 Last data filed at 11/23/11 0932  Gross per 24 hour  Intake 1450.66 ml  Output   1315 ml  Net 135.66 ml   Weight change: 4.9 kg (10 lb 12.8 oz) Gen:WD obese AAF in NAD sitting upright in chair [id number] Resp:decreased BS at bases [id number], nontender Ext:3+edema   Lab 11/23/11 0430 11/22/11 0500 11/21/11 1200 11/21/11 0145 11/20/11 2240 11/20/11 1228 11/20/11 0531 11/19/11 2155 11/19/11 1155  NA 126* 125* 122* 123* 123* 122* 122* -- --  K 4.4 4.5 4.5 5.3* 5.4* 4.5 4.2 -- --  CL 98 97 92* 93* 92* 88* 89* -- --  CO2 19 18* 18* 20 19 19 20  -- --  GLUCOSE 169* 186* 173* 140* 153* 237* 204* -- --  BUN 28* 35* 40* 39* 37* 37* 36* -- --  CREATININE 1.37* 1.93* 2.53* 2.81* 2.86* 3.01* 2.58* -- --  ALBUMIN -- 1.9* -- 2.2* 2.2* -- 2.9* -- 3.2*  CALCIUM 7.8* 7.4* 7.8* 7.8* 7.9* 8.8 8.9 -- --  PHOS -- 3.0 -- 3.7 -- -- -- 3.3 --  AST -- -- -- -- 21 -- 15 -- 18  ALT -- -- -- -- 12 -- 10 -- 11   Liver Function Tests:  Lab 11/22/11 0500 11/21/11 0145 11/20/11 2240 11/20/11 0531 11/19/11 1155  AST -- -- 21 15 18   ALT -- -- 12 10 11   ALKPHOS -- -- 65 78 76  BILITOT -- -- 0.3 0.4 0.4  PROT -- -- 5.1* 6.1 6.6  ALBUMIN 1.9* 2.2* 2.2* -- --    Lab 11/19/11 1155  LIPASE 18  AMYLASE --   No results found for this basename: AMMONIA:3 in the last 168 hours CBC:  Lab 11/23/11 0430 11/21/11 0145 11/20/11 1228 11/20/11 0531 11/19/11 1155  WBC 13.5* 10.9* 15.0* -- --  NEUTROABS -- -- -- -- 6.2  HGB 8.9* 9.8* 11.1* -- --  HCT 26.9* 30.0* 34.3* -- --  MCV 79.8 81.5 81.5 81.0 81.3  PLT 382 316 360 -- --   Cardiac Enzymes:  Lab 11/20/11 1228  CKTOTAL 117  CKMB 4.1*  CKMBINDEX --  TROPONINI  <0.30   CBG:  Lab 11/23/11 1139 11/23/11 0734 11/23/11 0407 11/22/11 2339 11/22/11 2207  GLUCAP 163* 157* 157* 156* 162*    Iron Studies: No results found for this basename: IRON,TIBC,TRANSFERRIN,FERRITIN in the last 72 hours Studies/Results: US Paracentesis  11/21/2011  *RADIOLOGY REPORT*  Clinical Data: Abdominal pain, acute renal failure, gastric wall thickening, omental stranding, ascites; request is made for diagnostic and therapeutic  paracentesis up to 2 liters.  ULTRASOUND GUIDED DIAGNOSTIC AND THERAPEUTIC  PARACENTESIS  An ultrasound guided paracentesis was thoroughly discussed with the patient and questions answered.  The benefits, risks, alternatives and complications were also discussed.  The patient understands and wishes to proceed with the procedure.  Written consent was obtained.  Ultrasound was performed to localize and mark an adequate pocket of fluid in the left mid to lower quadrant of the abdomen.  The area was then prepped and draped in the normal sterile fashion.  1% Lidocaine was used for local anesthesia.  Under ultrasound guidance a  [place] catheter was introduced.  Paracentesis was performed.  The catheter was removed and a dressing applied.  Complications:  none  Findings:  A total of approximately 1.5 liters of slightly turbid, yellow fluid was removed.  A fluid sample was  sent for laboratory analysis.  IMPRESSION: Successful ultrasound guided diagnostic and therapeutic paracentesis yielding 1.5 liters of ascites.  Read by: Rowe Robert, P.A.-C  Original Report Authenticated By: Trecia Rogers, M.D.   Dg Chest Port 1 View  11/23/2011  *RADIOLOGY REPORT*  Clinical Data: Atelectasis.  PORTABLE CHEST - 1 VIEW  Comparison: 11/22/2011.  Findings: Right IJ central line tip projects over the SVC.  Heart size stable.  Lungs are low in volume with mild diffuse bilateral air space disease, as before.  IMPRESSION: Low lung volumes mild diffuse bilateral air space  disease, possibly due to edema.  Original Report Authenticated By: Luretha Rued, M.D.   Dg Chest Port 1 View  11/22/2011  *RADIOLOGY REPORT*  Clinical Data: Follow up of atelectasis.  Coronary artery disease. Gastroesophageal reflux disease.  Diabetes.  PORTABLE CHEST - 1 VIEW  Comparison: 11/21/2011  Findings: Minimal motion degraded exam.  Right IJ central line unchanged tip at low SVC. Numerous leads and wires project over the chest.  Normal heart size.  Moderate hemidiaphragm elevation. No pleural effusion or pneumothorax.  Low lung volumes.  Resultant pulmonary interstitial prominence.  Patchy areas of lower lobe predominant atelectasis bilaterally.  The chin overlies the apices.  IMPRESSION: Low lung volumes with patchy areas of atelectasis.  Mildly motion degraded exam.  Original Report Authenticated By: Areta Haber, M.D.      . cefTRIAXone (ROCEPHIN)  IV  1 g Intravenous Q24H  . furosemide  20 mg Intravenous Once  . heparin subcutaneous  5,000 Units Subcutaneous Q8H  . hydrocortisone sod succinate (SOLU-CORTEF) injection  50 mg Intravenous Q6H  . insulin aspart  0-15 Units Subcutaneous Q4H  . levalbuterol  0.63 mg Nebulization Q6H  . levothyroxine  50 mcg Intravenous Q breakfast  . metronidazole  500 mg Intravenous Q8H  . ondansetron      . ondansetron  4 mg Intravenous Once  . sodium chloride  3 mL Intravenous Q12H  . DISCONTD: sodium chloride  500 mL Intravenous Once  . DISCONTD: sodium chloride  500 mL Intravenous Once    BMET    Component Value Date/Time   NA 126* 11/23/2011 0430   K 4.4 11/23/2011 0430   CL 98 11/23/2011 0430   CO2 19 11/23/2011 0430   GLUCOSE 169* 11/23/2011 0430   BUN 28* 11/23/2011 0430   CREATININE 1.37* 11/23/2011 0430   CALCIUM 7.8* 11/23/2011 0430   GFRNONAA 39* 11/23/2011 0430   GFRAA 45* 11/23/2011 0430   CBC    Component Value Date/Time   WBC 13.5* 11/23/2011 0430   RBC 3.37* 11/23/2011 0430   HGB 8.9* 11/23/2011 0430   HCT 26.9* 11/23/2011  0430   PLT 382 11/23/2011 0430   MCV 79.8 11/23/2011 0430   MCH 26.4 11/23/2011 0430   MCHC 33.1 11/23/2011 0430   RDW 16.6* 11/23/2011 0430   LYMPHSABS 1.6 11/19/2011 1155   MONOABS 0.9 11/19/2011 1155   EOSABS 0.4 11/19/2011 1155   BASOSABS 0.0 11/19/2011 1155   Assessment:  1. SBP possible source of sepsis-?perforation 2. Hyponatremia (improved) 3. SIRS/sepsis 4. AKI nonoliguric recovering ATN 5. Anemia 6. Anasarca (not related to nephrotic syndrome) 7. DM- per primary svc 8. Abd pain 9.  Obesity Plan:  1. creatinine markedly improved with significant increase in UOP. Likely in diuretic phase of ATN recovery 2. Off pressors. Agree that initial insult likely ischemic ATN due to volume depletion in setting of ARB and diuretics.  3. Low FeNa (off diuretics). More third spacing and peripheral edema so IVF's discontinued yesterday. May need to add lasix later 4. Await CT scan result to see if SBP related to bowel perf or abscess 5. GI following for possible EGD 6. Continue with surgical eval 7. Will sign off.  Call for questions or concerns  Taishawn Smaldone A

## 2011-11-23 NOTE — Progress Notes (Signed)
04:00 CVP- difficult to read wave form. This RN and Malachy Mood, RN obtained a strip that appeared to have an adequate wave pattern got a CVP reading of 11.

## 2011-11-23 NOTE — Progress Notes (Signed)
Name: Amanda Cain MRN: [id number] DOB: October 30, 1945    LOS: 4  PULMONARY / CRITICAL CARE MEDICINE NOTE  HPI:   66 y/o AAF with DM, CAD, HTN, obesity, hypothyroidism and GERD. Admitted 6/22  for evaluation and management of lower abdominal pain and vomiting for 3 to 4 days.  CT scan of the abdomen showed gastric wall thickening, small to moderate ascites and omental stranding, cholelithiasis and 3 mm cystic duct stone, without evidence of acute cholecystitis. At admission she was also found to have a creatinine of 2.59. General surgery, GI and nephrology consults were called. Afternoon of 6/23  the patient experienced a syncopal episode  And then remained hypotensive and oliguric. Critical care consult was called (6/23) to help in the evaluation and management.   Lines and tubes: 6/24 rt i j cvl>>  Micro: 6/24 bc x 2>>>1/2 GPR>>> 6/24 uc>>>gnr>>> 6/24 Peritoneal fluid> no org seen > 6/24 Peritoneal fluid pathology>>  Abx: 6/24 rocephin (?uti)>>> 6/24 flagyl (diarrhea) >>  Vital Signs: Temp:  [97.7 F (36.5 C)-99.2 F (37.3 C)] 99 F (37.2 C) (06/26 0800) Pulse Rate:  [102-115] 106  (06/26 0930) Resp:  [17-33] 21  (06/26 0930) BP: (95-149)/(39-99) 122/64 mmHg (06/26 0930) SpO2:  [91 %-100 %] 99 % (06/26 0930) Weight:  [266 lb 1.5 oz (120.7 kg)] 266 lb 1.5 oz (120.7 kg) (06/26 0400) FIO2 4lpm    Intake/Output Summary (Last 24 hours) at 11/23/11 1058 Last data filed at 11/23/11 0932  Gross per 24 hour  Intake 1854.46 ml  Output   1480 ml  Net 374.46 ml    CVP:  [6 mmHg-15 mmHg] 15 mmHg   Physical Examination: General:  Awake, alert, no acute distress. Anxious. Neuro:  Oriented x 3. No focal neurological deficits. HEENT:  PERRLA, pink conjunctivae, dry mucous membranes Neck:  Supple, no JVD  . Rt I J cvl noted. Cardiovascular:  RRR, no M/R/G.  Lungs:  Diminished breath sounds bilaterally. Clear to auscultation. upper airway wheezing. Abdomen:  Obese, prominent  abdomen. Soft, nontender, distended, bowel sounds hypoactive Musculoskeletal:  Moves all extremities, 1+ generalized edema Skin:  No rash   Lab 11/20/11 1623 11/20/11 1241  PHART 7.316* 7.286*  PCO2ART 38.5 38.9  PO2ART 92.7 140.0*  HCO3 19.1* 18.0*  O2SAT 98.2 98.3    Lab 11/20/11 1708 11/20/11 1228 11/19/11 2155  TROPONINI -- <0.30 --  LATICACIDVEN 0.9 -- --  PROBNP -- -- 130.6*    Lab 11/23/11 0430 11/22/11 0500 11/21/11 1200  NA 126* 125* 122*  K 4.4 4.5 4.5  CL 98 97 92*  CO2 19 18* 18*  BUN 28* 35* 40*  CREATININE 1.37* 1.93* 2.53*  GLUCOSE 169* 186* 173*    Lab 11/23/11 0430 11/21/11 0145 11/20/11 1228  HGB 8.9* 9.8* 11.1*  HCT 26.9* 30.0* 34.3*  WBC 13.5* 10.9* 15.0*  PLT 382 316 360       Lab 11/20/11 2240 11/20/11 0531 11/19/11 1155  AST 21 15 18   ALT 12 10 11   ALKPHOS 65 78 76  BILITOT 0.3 0.4 0.4  PROT 5.1* 6.1 6.6  ALBUMIN 2.2* 2.9* 3.2*     Lab 11/21/11 0006 11/20/11 2302 11/20/11 1931 11/20/11 1633 11/20/11 1237  GLUCAP 146* 141* 152* 207* 235*   US Paracentesis  11/21/2011  *RADIOLOGY REPORT*  Clinical Data: Abdominal pain, acute renal failure, gastric wall thickening, omental stranding, ascites; request is made for diagnostic and therapeutic  paracentesis up to 2 liters.  ULTRASOUND GUIDED DIAGNOSTIC AND THERAPEUTIC  PARACENTESIS  An ultrasound guided paracentesis was thoroughly discussed with the patient and questions answered.  The benefits, risks, alternatives and complications were also discussed.  The patient understands and wishes to proceed with the procedure.  Written consent was obtained.  Ultrasound was performed to localize and mark an adequate pocket of fluid in the left mid to lower quadrant of the abdomen.  The area was then prepped and draped in the normal sterile fashion.  1% Lidocaine was used for local anesthesia.  Under ultrasound guidance a 19 gauge Yueh catheter was introduced.  Paracentesis was performed.  The catheter was  removed and a dressing applied.  Complications:  none  Findings:  A total of approximately 1.5 liters of slightly turbid, yellow fluid was removed.  A fluid sample was  sent for laboratory analysis.  IMPRESSION: Successful ultrasound guided diagnostic and therapeutic paracentesis yielding 1.5 liters of ascites.  Read by: Rowe Robert, P.A.-C  Original Report Authenticated By: Trecia Rogers, M.D.   Dg Chest Port 1 View  11/23/2011  *RADIOLOGY REPORT*  Clinical Data: Atelectasis.  PORTABLE CHEST - 1 VIEW  Comparison: 11/22/2011.  Findings: Right IJ central line tip projects over the SVC.  Heart size stable.  Lungs are low in volume with mild diffuse bilateral air space disease, as before.  IMPRESSION: Low lung volumes mild diffuse bilateral air space disease, possibly due to edema.  Original Report Authenticated By: Luretha Rued, M.D.   Dg Chest Port 1 View  11/22/2011  *RADIOLOGY REPORT*  Clinical Data: Follow up of atelectasis.  Coronary artery disease. Gastroesophageal reflux disease.  Diabetes.  PORTABLE CHEST - 1 VIEW  Comparison: 11/21/2011  Findings: Minimal motion degraded exam.  Right IJ central line unchanged tip at low SVC. Numerous leads and wires project over the chest.  Normal heart size.  Moderate hemidiaphragm elevation. No pleural effusion or pneumothorax.  Low lung volumes.  Resultant pulmonary interstitial prominence.  Patchy areas of lower lobe predominant atelectasis bilaterally.  The chin overlies the apices.  IMPRESSION: Low lung volumes with patchy areas of atelectasis.  Mildly motion degraded exam.  Original Report Authenticated By: Areta Haber, M.D.    Principal Problem:  *Abdominal pain Active Problems:  Obesity (BMI 30.0-34.9)  Hyponatremia  Anemia  Acute kidney injury  Moderate protein-calorie malnutrition  Diabetes mellitus  HTN (hypertension)  Hypothyroidism  Dyslipidemia  Syncope and collapse  Shock   ASSESSMENT AND PLAN  66 year old female  with PMH relevant for DM, HTN, CAD, obesity and hypothyroidism. Presents with abdominal pain and vomiting and found to be in acute renal failure at admission. A CT scan of the abdomen showed gastric wall thickening, small to moderate ascites and omental stranding. It also showed cholelithiasis and 3 mm cystic duct stone with no CT evidence of acute cholecystitis. Today she had a syncopal episode and developed persistent hypotension and anuria. She has responded well to fluid resuscitation (close to 6 L positive since admission) but remains anuric. Her acute renal failure is likely secondary to ATN due to volume depletion and also medication related including ARB, diuretic and NSAIDs. Renal US did not showed hydronephrosis.  Nephrology input appreciated. Currently no indication for emergent dialysis. With persistent hypotension and elevated WBC of 15 at admission in the setting of abdominal pain, vomiting and an abnormal CT of the abdomen I'm concerned for infection of intraabdominal source. Her abdomen is benign on exam but she is a diabetic chronically ill lady and may not  be able to mount a full inflammatroy response. Of note, she had an echocardiogram today that showed normal LVEF with impaired relaxation diastolic dysfunction. Chest X ray did not show any acute infiltrates but showed small volumes with atelectasis.  Plan:  Hypotension, possible sepsis of abdominal origin.   S/p paracentesis 6/24 with 1.5 L fluid removed.  6/26 shock resolved.  - pan cultured - Rocephin and Flagyl for possible intraabdominal infection - follow peritoneal fluid - 6/25 - stress steroids, consider d/c am 6/27   Acute renal failure / hyperkalemia / Hyponatremia Renal US 6/23 > negative  Lab 11/23/11 0430 11/22/11 0500 11/21/11 1200 11/21/11 0145 11/20/11 2240  CREATININE 1.37* 1.93* 2.53* 2.81* 2.86*    Intake/Output      06/25 0701 - 06/26 0700 06/26 0701 - 06/27 0700   P.O. 490 100   I.V. (mL/kg) 1637.7 (13.6)  20 (0.2)   IV Piggyback 250 100   Total Intake(mL/kg) 2377.7 (19.7) 220 (1.8)   Urine (mL/kg/hr) 1625 (0.6) 250 (0.2)   Total Output 1625 250   Net +752.7 -30        Stool Occurrence 2 x 2 x     - Nephrology  Following.  UOP improving 6/25 - Metformin and antihypertensives on hold - Strict I&O's - KVO IVF, lasix x1 6/26   Mild hypoxemia with ATX from abd distension / 02 dep - Likely secondary to atelectasis. Possibly some fluid overload.  No acute infiltrates on X ray - xopenex - pulmonary hygiene   DM Hypothyroidism - Continue insulin sliding scale - synthroid   DVT prophylaxis - Heparin resumed 6/25     Noe Gens, NP-C [facility] Pgr: [phone] or [phone]    STAFF NOTE: I, Dr Ann Lions have personally reviewed patient's available data, including medical history, events of note, physical examination and test results as part of my evaluation. I have discussed with resident/NP and other care providers such as pharmacist, RN and RRT.  In addition,  I personally evaluated patient and elicited key findings of  Improving renal failure and no clear etiology for ascites.  Rest per NP/medical resident whose note is outlined above and that I agree with    Dr. Brand Males, M.D., [facility].C.P Pulmonary and Critical Care Medicine Staff Physician [facility] Pager: [phone], If no answer or between  15:00h - 7:00h: call [phone]  11/23/2011 5:03 PM

## 2011-11-23 NOTE — Progress Notes (Signed)
Subjective: Feels better today. Nurse reports 2 bm's overnight. Off pressors  Objective: Vital signs in last 24 hours: Temp:  [97.7 F (36.5 C)-99.2 F (37.3 C)] 97.7 F (36.5 C) (06/26 0400) Pulse Rate:  [91-127] 109  (06/26 0600) Resp:  [18-36] 19  (06/26 0700) BP: (88-149)/(39-99) 136/57 mmHg (06/26 0700) SpO2:  [91 %-100 %] 100 % (06/26 0749) Weight:  [266 lb 1.5 oz (120.7 kg)] 266 lb 1.5 oz (120.7 kg) (06/26 0400) Last BM Date: 11/22/11  Intake/Output from previous day: 06/25 0701 - 06/26 0700 In: 2377.7 [P.O.:490; I.V.:1637.7; IV Piggyback:250] Out: 1625 [Urine:1625] Intake/Output this shift: Total I/O In: -  Out: 125 [Urine:125]  GI: soft, nontender.  Lab Results:   Basename 11/23/11 0430 11/21/11 0145  WBC 13.5* 10.9*  HGB 8.9* 9.8*  HCT 26.9* 30.0*  PLT 382 316   BMET  Basename 11/23/11 0430 11/22/11 0500  NA 126* 125*  K 4.4 4.5  CL 98 97  CO2 19 18*  GLUCOSE 169* 186*  BUN 28* 35*  CREATININE 1.37* 1.93*  CALCIUM 7.8* 7.4*   PT/INR No results found for this basename: LABPROT:2,INR:2 in the last 72 hours ABG  Basename 11/21/11 1200 11/20/11 1623  PHART 7.348* 7.316*  HCO3 16.2* 19.1*    Studies/Results: Nm Pulmonary Perfusion  11/21/2011  *RADIOLOGY REPORT*  Clinical Data: Shortness of breath  NM PULMONARY PERFUSION PARTICULATE  Radiopharmaceutical: 3.4MILLI CURIE MAA TECHNETIUM TO 75M ALBUMIN AGGREGATED  Comparison: Chest x-ray from 11/21/2011  Findings: Multiplanar perfusion imaging shows no peripheral moderate or large perfusion defect in either lung.  Elevation of the right hemidiaphragm accounts for decreased activity at the right base.  Given the normal bilateral perfusion, ventilation imaging was not performed.  IMPRESSION: Normal bilateral lung perfusion.  Original Report Authenticated By: ERIC A. MANSELL, M.D.   US Paracentesis  11/21/2011  *RADIOLOGY REPORT*  Clinical Data: Abdominal pain, acute renal failure, gastric wall  thickening, omental stranding, ascites; request is made for diagnostic and therapeutic  paracentesis up to 2 liters.  ULTRASOUND GUIDED DIAGNOSTIC AND THERAPEUTIC  PARACENTESIS  An ultrasound guided paracentesis was thoroughly discussed with the patient and questions answered.  The benefits, risks, alternatives and complications were also discussed.  The patient understands and wishes to proceed with the procedure.  Written consent was obtained.  Ultrasound was performed to localize and mark an adequate pocket of fluid in the left mid to lower quadrant of the abdomen.  The area was then prepped and draped in the normal sterile fashion.  1% Lidocaine was used for local anesthesia.  Under ultrasound guidance a 19 gauge Yueh catheter was introduced.  Paracentesis was performed.  The catheter was removed and a dressing applied.  Complications:  none  Findings:  A total of approximately 1.5 liters of slightly turbid, yellow fluid was removed.  A fluid sample was  sent for laboratory analysis.  IMPRESSION: Successful ultrasound guided diagnostic and therapeutic paracentesis yielding 1.5 liters of ascites.  Read by: Rowe Robert, P.A.-C  Original Report Authenticated By: Trecia Rogers, M.D.   Dg Chest Port 1 View  11/22/2011  *RADIOLOGY REPORT*  Clinical Data: Follow up of atelectasis.  Coronary artery disease. Gastroesophageal reflux disease.  Diabetes.  PORTABLE CHEST - 1 VIEW  Comparison: 11/21/2011  Findings: Minimal motion degraded exam.  Right IJ central line unchanged tip at low SVC. Numerous leads and wires project over the chest.  Normal heart size.  Moderate hemidiaphragm elevation. No pleural effusion or pneumothorax.  Low  lung volumes.  Resultant pulmonary interstitial prominence.  Patchy areas of lower lobe predominant atelectasis bilaterally.  The chin overlies the apices.  IMPRESSION: Low lung volumes with patchy areas of atelectasis.  Mildly motion degraded exam.  Original Report Authenticated  By: Areta Haber, M.D.    Anti-infectives: Anti-infectives     Start     Dose/Rate Route Frequency Ordered Stop   11/21/11 0100   metroNIDAZOLE (FLAGYL) IVPB 500 mg        500 mg 100 mL/hr over 60 Minutes Intravenous Every 8 hours 11/21/11 0018     11/21/11 0100   cefTRIAXone (ROCEPHIN) 1 g in dextrose 5 % 50 mL IVPB        1 g 100 mL/hr over 30 Minutes Intravenous Every 24 hours 11/21/11 0038            Assessment/Plan: s/p * No surgery found * Continue abx and supportive care Definite improvement Possible endoscopy later this week if she continues to look better  LOS: 4 days    TOTH III,Keyshawna Prouse S 11/23/2011

## 2011-11-23 NOTE — Progress Notes (Signed)
eLink Physician-Brief Progress Note Patient Name: Amanda Cain DOB: 28-Mar-1946 MRN: [id number]  Date of Service  11/23/2011   HPI/Events of Note  Pt asking for her home ativan  eICU Interventions  Will give single low dose and follow - i am hesitant to restart scheduled ativan given her tenuous status      Tennelle Taflinger S. 11/23/2011, 3:36 PM

## 2011-11-23 NOTE — Plan of Care (Signed)
Problem: Phase I Progression Outcomes Goal: OOB as tolerated unless otherwise ordered Outcome: Not Progressing Weak, required assistance from [facility] lift Goal: Voiding-avoid urinary catheter unless indicated Outcome: Not Progressing Urinary catheter to monitor critical urine output Goal: Hemodynamically stable Outcome: Progressing BP stable and trending up

## 2011-11-23 NOTE — Progress Notes (Signed)
Notified Dr. Lamonte Sakai, [facility] Lacy, of patient's request to restart home medication, ativan.

## 2011-11-24 DIAGNOSIS — N289 Disorder of kidney and ureter, unspecified: Secondary | ICD-10-CM

## 2011-11-24 DIAGNOSIS — E669 Obesity, unspecified: Secondary | ICD-10-CM

## 2011-11-24 LAB — GLUCOSE, CAPILLARY
Glucose-Capillary: 150 mg/dL — ABNORMAL HIGH (ref 70–99)
Glucose-Capillary: 151 mg/dL — ABNORMAL HIGH (ref 70–99)
Glucose-Capillary: 166 mg/dL — ABNORMAL HIGH (ref 70–99)
Glucose-Capillary: 172 mg/dL — ABNORMAL HIGH (ref 70–99)
Glucose-Capillary: 181 mg/dL — ABNORMAL HIGH (ref 70–99)
Glucose-Capillary: 187 mg/dL — ABNORMAL HIGH (ref 70–99)
Glucose-Capillary: 191 mg/dL — ABNORMAL HIGH (ref 70–99)

## 2011-11-24 LAB — URINE CULTURE
Colony Count: 45000
Culture  Setup Time: 201306241221

## 2011-11-24 MED ORDER — BIOTENE DRY MOUTH MT LIQD
15.0000 mL | Freq: Two times a day (BID) | OROMUCOSAL | Status: DC
  Administered 2011-11-24 – 2011-11-29 (×11): 15 mL via OROMUCOSAL

## 2011-11-24 MED ORDER — POTASSIUM CHLORIDE CRYS ER 20 MEQ PO TBCR
40.0000 meq | EXTENDED_RELEASE_TABLET | Freq: Once | ORAL | Status: AC
  Administered 2011-11-24: 40 meq via ORAL
  Filled 2011-11-24: qty 2

## 2011-11-24 MED ORDER — FUROSEMIDE 10 MG/ML IJ SOLN
40.0000 mg | Freq: Once | INTRAMUSCULAR | Status: AC
  Administered 2011-11-24: 40 mg via INTRAVENOUS
  Filled 2011-11-24: qty 4

## 2011-11-24 NOTE — Progress Notes (Signed)
  Subjective: Up in chair, feels better, no abdominal pain, taking clears, and had 4 BM's recorded, soft consistency, which is apparently in her normal range.  Objective: Vital signs in last 24 hours: Temp:  [98.2 F (36.8 C)-99.1 F (37.3 C)] 98.4 F (36.9 C) (06/27 0400) Pulse Rate:  [56-133] 110  (06/27 0700) Resp:  [15-34] 31  (06/27 0700) BP: (84-151)/(41-75) 103/51 mmHg (06/27 0600) SpO2:  [96 %-100 %] 98 % (06/27 0753) Weight:  [121.6 kg (268 lb 1.3 oz)] 121.6 kg (268 lb 1.3 oz) (06/27 0000) Last BM Date: 11/23/11  580 ml PO, 4 stools yesterday. Diet: clears, VSS, No labs today  Intake/Output from previous day: 06/26 0701 - 06/27 0700 In: 1410 [P.O.:580; I.V.:480; IV Piggyback:350] Out: 1520 [Urine:1520] Intake/Output this shift:    General appearance: alert, cooperative and no distress GI: soft, non-tender; bowel sounds normal; no masses,  no organomegaly  Lab Results:   Basename 11/23/11 0430  WBC 13.5*  HGB 8.9*  HCT 26.9*  PLT 382    BMET  Basename 11/23/11 0430 11/22/11 0500  NA 126* 125*  K 4.4 4.5  CL 98 97  CO2 19 18*  GLUCOSE 169* 186*  BUN 28* 35*  CREATININE 1.37* 1.93*  CALCIUM 7.8* 7.4*   PT/INR No results found for this basename: LABPROT:2,INR:2 in the last 72 hours   Lab 11/22/11 0500 11/21/11 0145 11/20/11 2240 11/20/11 0531 11/19/11 1155  AST -- -- 21 15 18   ALT -- -- 12 10 11   ALKPHOS -- -- 65 78 76  BILITOT -- -- 0.3 0.4 0.4  PROT -- -- 5.1* 6.1 6.6  ALBUMIN 1.9* 2.2* 2.2* 2.9* 3.2*     Lipase     Component Value Date/Time   LIPASE 18 11/19/2011 1155     Studies/Results: Dg Chest Port 1 View  11/23/2011  *RADIOLOGY REPORT*  Clinical Data: Atelectasis.  PORTABLE CHEST - 1 VIEW  Comparison: 11/22/2011.  Findings: Right IJ central line tip projects over the SVC.  Heart size stable.  Lungs are low in volume with mild diffuse bilateral air space disease, as before.  IMPRESSION: Low lung volumes mild diffuse bilateral air  space disease, possibly due to edema.  Original Report Authenticated By: Luretha Rued, M.D.    Medications:    . antiseptic oral rinse  15 mL Mouth Rinse BID  . cefTRIAXone (ROCEPHIN)  IV  1 g Intravenous Q24H  . furosemide  20 mg Intravenous Once  . heparin subcutaneous  5,000 Units Subcutaneous Q8H  . hydrocortisone sod succinate (SOLU-CORTEF) injection  50 mg Intravenous Q6H  . insulin aspart  0-15 Units Subcutaneous Q4H  . levalbuterol  0.63 mg Nebulization Q6H  . levothyroxine  50 mcg Intravenous Q breakfast  . LORazepam  0.5 mg Intravenous Once  . metronidazole  500 mg Intravenous Q8H  . ondansetron      . ondansetron  4 mg Intravenous Once  . sodium chloride  3 mL Intravenous Q12H    Assessment/Plan Abdominal pain with thickened gastric wall and omental stranding. Concern for gastric CA.  Endoscopy deferred with possible sepsis and renal failure  Hypotension, still on Neo at 190mcg/min.  Hyponatremia  AODM  PCM  Obesity   Plan:  Will continue to be available as needed.  LOS: 5 days    Amanda Cain 11/24/2011

## 2011-11-24 NOTE — Progress Notes (Signed)
Subjective: She is feeling well at this time.  Objective: Vital signs in last 24 hours: Temp:  [98.1 F (36.7 C)-99.1 F (37.3 C)] 98.1 F (36.7 C) (06/27 1130) Pulse Rate:  [101-124] 124  (06/27 1300) Resp:  [15-31] 27  (06/27 1300) BP: (84-153)/(29-68) 153/61 mmHg (06/27 1300) SpO2:  [96 %-100 %] 96 % (06/27 1300) Weight:  [121.6 kg (268 lb 1.3 oz)] 121.6 kg (268 lb 1.3 oz) (06/27 0000) Last BM Date: 11/24/11  Intake/Output from previous day: 06/26 0701 - 06/27 0700 In: 1410 [P.O.:580; I.V.:480; IV Piggyback:350] Out: 1520 [Urine:1520] Intake/Output this shift: Total I/O In: 680 [P.O.:480; I.V.:100; IV Piggyback:100] Out: 450 [Urine:450]  General appearance: alert and no distress GI: Distended with ascites, soft  Lab Results:  Basename 11/23/11 0430  WBC 13.5*  HGB 8.9*  HCT 26.9*  PLT 382   BMET  Basename 11/23/11 0430 11/22/11 0500  NA 126* 125*  K 4.4 4.5  CL 98 97  CO2 19 18*  GLUCOSE 169* 186*  BUN 28* 35*  CREATININE 1.37* 1.93*  CALCIUM 7.8* 7.4*   LFT  Basename 11/22/11 0500  PROT --  ALBUMIN 1.9*  AST --  ALT --  ALKPHOS --  BILITOT --  BILIDIR --  IBILI --   PT/INR No results found for this basename: LABPROT:2,INR:2 in the last 72 hours Hepatitis Panel No results found for this basename: HEPBSAG,HCVAB,HEPAIGM,HEPBIGM in the last 72 hours C-Diff No results found for this basename: CDIFFTOX:3 in the last 72 hours Fecal Lactopherrin No results found for this basename: FECLLACTOFRN in the last 72 hours  Studies/Results: Dg Chest Port 1 View  11/23/2011  *RADIOLOGY REPORT*  Clinical Data: Atelectasis.  PORTABLE CHEST - 1 VIEW  Comparison: 11/22/2011.  Findings: Right IJ central line tip projects over the SVC.  Heart size stable.  Lungs are low in volume with mild diffuse bilateral air space disease, as before.  IMPRESSION: Low lung volumes mild diffuse bilateral air space disease, possibly due to edema.  Original Report Authenticated By:  Luretha Rued, M.D.    Medications:  Scheduled:   . antiseptic oral rinse  15 mL Mouth Rinse BID  . cefTRIAXone (ROCEPHIN)  IV  1 g Intravenous Q24H  . furosemide  40 mg Intravenous Once  . heparin subcutaneous  5,000 Units Subcutaneous Q8H  . insulin aspart  0-15 Units Subcutaneous Q4H  . levalbuterol  0.63 mg Nebulization Q6H  . levothyroxine  50 mcg Intravenous Q breakfast  . LORazepam  0.5 mg Intravenous Once  . metronidazole  500 mg Intravenous Q8H  . ondansetron  4 mg Intravenous Once  . potassium chloride  40 mEq Oral Once  . sodium chloride  3 mL Intravenous Q12H  . DISCONTD: hydrocortisone sod succinate (SOLU-CORTEF) injection  50 mg Intravenous Q6H   Continuous:   . sodium chloride 20 mL/hr at 11/23/11 2149  . DISCONTD: phenylephrine (NEO-SYNEPHRINE) Adult infusion 30 mcg/min (11/22/11 1448)    Assessment/Plan: 1) Abnormal CT scan. 2) SBP. 3) UTI.   Clinically she is well at this time.  No pressors.  I will pursue the EGD tomorrow.  Plan: 1) EGD tomorrow.  LOS: 5 days   Lachlan Pelto D 11/24/2011, 2:04 PM

## 2011-11-24 NOTE — Progress Notes (Signed)
Name: Amanda Cain MRN: [id number] DOB: January 10, 1946    LOS: 5  PULMONARY / CRITICAL CARE MEDICINE NOTE  HPI:   66 y/o AAF with DM, CAD, HTN, obesity, hypothyroidism and GERD. Admitted 6/22  for evaluation and management of lower abdominal pain and vomiting for 3 to 4 days.  CT scan of the abdomen showed gastric wall thickening, small to moderate ascites and omental stranding, cholelithiasis and 3 mm cystic duct stone, without evidence of acute cholecystitis. At admission she was also found to have a creatinine of 2.59. General surgery, GI and nephrology consults were called. Afternoon of 6/23  the patient experienced a syncopal episode  And then remained hypotensive and oliguric. Critical care consult was called (6/23) to help in the evaluation and management.   Lines and tubes: 6/24 rt i j cvl>>  Micro: 6/24 bc x 2>>>1/2 GPR>>> 6/24 uc>>>gnr>>>klebsiella pneumoniae>>>sens to rocephin (pan sens)                         >>>citrobacter koseri>>>sens to rocephin  6/24 Peritoneal fluid>>>no wbc, no organisms>>> 6/24 Peritoneal fluid pathology>>>neg malignant cells, inflammation noted  Abx: 6/24 rocephin (uti)>>> 6/24 flagyl (diarrhea) >>  Vital Signs: Temp:  [98.1 F (36.7 C)-99.1 F (37.3 C)] 98.1 F (36.7 C) (06/27 1130) Pulse Rate:  [101-123] 111  (06/27 1100) Resp:  [15-31] 26  (06/27 1100) BP: (84-140)/(29-75) 128/67 mmHg (06/27 1100) SpO2:  [96 %-100 %] 99 % (06/27 1100) Weight:  [268 lb 1.3 oz (121.6 kg)] 268 lb 1.3 oz (121.6 kg) (06/27 0000) FIO2 4lpm    Intake/Output Summary (Last 24 hours) at 11/24/11 1207 Last data filed at 11/24/11 1100  Gross per 24 hour  Intake   1510 ml  Output   1320 ml  Net    190 ml        Physical Examination: General:  Awake, alert, no acute distress. Anxious. Neuro:  Oriented x 3. No focal neurological deficits. HEENT:  PERRLA, pink conjunctivae, dry mucous membranes Neck:  Supple, no JVD  . Rt I J cvl noted. Cardiovascular:   RRR, no M/R/G.  Lungs:  Diminished breath sounds bilaterally. Clear to auscultation. upper airway wheezing. Abdomen:  Obese, prominent abdomen. Soft, nontender, distended, bowel sounds hypoactive Musculoskeletal:  Moves all extremities, 1+ generalized edema Skin:  No rash   Lab 11/20/11 1623 11/20/11 1241  PHART 7.316* 7.286*  PCO2ART 38.5 38.9  PO2ART 92.7 140.0*  HCO3 19.1* 18.0*  O2SAT 98.2 98.3    Lab 11/20/11 1708 11/20/11 1228 11/19/11 2155  TROPONINI -- <0.30 --  LATICACIDVEN 0.9 -- --  PROBNP -- -- 130.6*    Lab 11/23/11 0430 11/22/11 0500 11/21/11 1200  NA 126* 125* 122*  K 4.4 4.5 4.5  CL 98 97 92*  CO2 19 18* 18*  BUN 28* 35* 40*  CREATININE 1.37* 1.93* 2.53*  GLUCOSE 169* 186* 173*    Lab 11/23/11 0430 11/21/11 0145 11/20/11 1228  HGB 8.9* 9.8* 11.1*  HCT 26.9* 30.0* 34.3*  WBC 13.5* 10.9* 15.0*  PLT 382 316 360    RADIOLOGY  Dg Chest Port 1 View  11/23/2011  *RADIOLOGY REPORT*  Clinical Data: Atelectasis.  PORTABLE CHEST - 1 VIEW  Comparison: 11/22/2011.  Findings: Right IJ central line tip projects over the SVC.  Heart size stable.  Lungs are low in volume with mild diffuse bilateral air space disease, as before.  IMPRESSION: Low lung volumes mild diffuse bilateral air space disease, possibly  due to edema.  Original Report Authenticated By: Luretha Rued, M.D.    Principal Problem:  *Abdominal pain Active Problems:  Obesity (BMI 30.0-34.9)  Hyponatremia  Anemia  Acute kidney injury  Moderate protein-calorie malnutrition  Diabetes mellitus  HTN (hypertension)  Hypothyroidism  Dyslipidemia  Syncope and collapse  Shock   ASSESSMENT AND PLAN  66 year old female with PMH relevant for DM, HTN, CAD, obesity and hypothyroidism. Presented with abdominal pain and vomiting and found to be in acute renal failure at admission. A CT scan of the abdomen showed gastric wall thickening, small to moderate ascites and omental stranding. It also showed  cholelithiasis and 3 mm cystic duct stone with no CT evidence of acute cholecystitis. Syncopal episode and developed persistent hypotension and anuria. She responded well to fluid resuscitation (close to 6 L positive since admission) and vasopressors. Her acute renal failure is likely secondary to ATN due to volume depletion and also medication related including ARB, diuretic and NSAIDs. Renal US neg for hydronephrosis.  Nephrology input appreciated.  Weaned off pressors.  Renal fxn resolved.  Concern for infection in abd vs. Cancer.  Positive for UTI with klebsiella, citrobacter.      Hypotension, possible sepsis of abdominal origin.  Differential DDx includes SBP, cancer, UTI. S/p paracentesis 6/24 with 1.5 L fluid removed.  6/26 shock resolved.  Plan: - pan cultured - Rocephin and Flagyl for possible intraabdominal infection - follow peritoneal fluid - d/c stress steroids 6/27   Acute renal failure  Hyperkalemia  Hyponatremia   Renal US 6/23 > negative  Lab 11/23/11 0430 11/22/11 0500 11/21/11 1200 11/21/11 0145 11/20/11 2240  CREATININE 1.37* 1.93* 2.53* 2.81* 2.86*    Intake/Output      06/26 0701 - 06/27 0700 06/27 0701 - 06/28 0700   P.O. 580 240   I.V. (mL/kg) 480 (3.9) 60 (0.5)   IV Piggyback 350 100   Total Intake(mL/kg) 1410 (11.6) 400 (3.3)   Urine (mL/kg/hr) 1520 (0.5) 300 (0.5)   Total Output 1520 300   Net -110 +100        Stool Occurrence 4 x 1 x     - Nephrology  Following.  UOP improving 6/25 - Metformin and antihypertensives on hold - Strict I&O's - KVO IVF, lasix x1 6/26, 6/27   Mild hypoxemia with ATX from abd distension / 02 dep - Likely secondary to atelectasis. Possibly some fluid overload.  No acute infiltrates on X ray - xopenex - pulmonary hygiene   DM Hypothyroidism - Continue insulin sliding scale - synthroid   DVT prophylaxis - Heparin      Noe Gens, NP-C [facility] Pgr: [phone] or [phone]       11/24/2011 12:07 PM         STAFF NOTE: I, Dr Ann Lions have personally reviewed patient's available data, including medical history, events of note, physical examination and test results as part of my evaluation. I have discussed with resident/NP and other care providers such as pharmacist, RN and RRT.  In addition,  I personally evaluated patient and elicited key findings of GI plans scopy tomorrow. She hasGNR in urine. Off pressors and pccm will sign off.  Rest per NP/medical resident whose note is outlined above and that I agree with    Dr. Brand Males, M.D., [facility].C.P Pulmonary and Critical Care Medicine Staff Physician [facility] Pager: [phone], If no answer or between  15:00h -  7:00h: call [phone]  11/24/2011 6:03 PM

## 2011-11-25 ENCOUNTER — Encounter (HOSPITAL_COMMUNITY): Payer: Self-pay | Admitting: *Deleted

## 2011-11-25 ENCOUNTER — Encounter (HOSPITAL_COMMUNITY)
Admission: EM | Disposition: A | Discharge status: Home Health | Payer: Self-pay | Source: Home / Self Care | Attending: Internal Medicine

## 2011-11-25 DIAGNOSIS — A419 Sepsis, unspecified organism: Secondary | ICD-10-CM

## 2011-11-25 DIAGNOSIS — D649 Anemia, unspecified: Secondary | ICD-10-CM

## 2011-11-25 DIAGNOSIS — N390 Urinary tract infection, site not specified: Secondary | ICD-10-CM | POA: Diagnosis present

## 2011-11-25 DIAGNOSIS — R6521 Severe sepsis with septic shock: Secondary | ICD-10-CM

## 2011-11-25 DIAGNOSIS — K652 Spontaneous bacterial peritonitis: Secondary | ICD-10-CM | POA: Diagnosis present

## 2011-11-25 HISTORY — PX: ESOPHAGOGASTRODUODENOSCOPY: SHX5428

## 2011-11-25 LAB — CBC
HCT: 27.6 % — ABNORMAL LOW (ref 36.0–46.0)
Hemoglobin: 8.9 g/dL — ABNORMAL LOW (ref 12.0–15.0)
MCH: 26.1 pg (ref 26.0–34.0)
MCHC: 32.2 g/dL (ref 30.0–36.0)
MCV: 80.9 fL (ref 78.0–100.0)
Platelets: 453 10*3/uL — ABNORMAL HIGH (ref 150–400)
RBC: 3.41 MIL/uL — ABNORMAL LOW (ref 3.87–5.11)
RDW: 17.4 % — ABNORMAL HIGH (ref 11.5–15.5)
WBC: 16.1 10*3/uL — ABNORMAL HIGH (ref 4.0–10.5)

## 2011-11-25 LAB — GLUCOSE, CAPILLARY
Glucose-Capillary: 131 mg/dL — ABNORMAL HIGH (ref 70–99)
Glucose-Capillary: 107 mg/dL — ABNORMAL HIGH (ref 70–99)
Glucose-Capillary: 116 mg/dL — ABNORMAL HIGH (ref 70–99)
Glucose-Capillary: 117 mg/dL — ABNORMAL HIGH (ref 70–99)
Glucose-Capillary: 136 mg/dL — ABNORMAL HIGH (ref 70–99)
Glucose-Capillary: 136 mg/dL — ABNORMAL HIGH (ref 70–99)

## 2011-11-25 LAB — BASIC METABOLIC PANEL
BUN: 27 mg/dL — ABNORMAL HIGH (ref 6–23)
CO2: 23 mEq/L (ref 19–32)
Calcium: 8.4 mg/dL (ref 8.4–10.5)
Chloride: 103 mEq/L (ref 96–112)
Creatinine, Ser: 1.43 mg/dL — ABNORMAL HIGH (ref 0.50–1.10)
GFR calc Af Amer: 43 mL/min — ABNORMAL LOW (ref >90)
GFR calc non Af Amer: 37 mL/min — ABNORMAL LOW (ref >90)
Glucose, Bld: 129 mg/dL — ABNORMAL HIGH (ref 70–99)
Potassium: 3.2 mEq/L — ABNORMAL LOW (ref 3.5–5.1)
Sodium: 133 mEq/L — ABNORMAL LOW (ref 135–145)

## 2011-11-25 LAB — BODY FLUID CULTURE
Culture: NO GROWTH
Gram Stain: NONE SEEN
Special Requests: NORMAL

## 2011-11-25 LAB — PROCALCITONIN: Procalcitonin: 0.16 ng/mL

## 2011-11-25 SURGERY — EGD (ESOPHAGOGASTRODUODENOSCOPY)
Anesthesia: Moderate Sedation

## 2011-11-25 MED ORDER — INSULIN ASPART 100 UNIT/ML ~~LOC~~ SOLN: 0.0000 [IU] | Freq: Three times a day (TID) | SUBCUTANEOUS | Status: DC

## 2011-11-25 MED ORDER — DIPHENHYDRAMINE HCL 50 MG/ML IJ SOLN
INTRAMUSCULAR | Status: AC
  Filled 2011-11-25: qty 1

## 2011-11-25 MED ORDER — LORAZEPAM 0.5 MG PO TABS
0.5000 mg | ORAL_TABLET | Freq: Once | ORAL | Status: AC
  Administered 2011-11-25: 0.5 mg via ORAL
  Filled 2011-11-25: qty 1

## 2011-11-25 MED ORDER — POTASSIUM CHLORIDE CRYS ER 20 MEQ PO TBCR
40.0000 meq | EXTENDED_RELEASE_TABLET | Freq: Once | ORAL | Status: AC
  Administered 2011-11-25: 40 meq via ORAL
  Filled 2011-11-25: qty 2

## 2011-11-25 MED ORDER — POTASSIUM CHLORIDE 10 MEQ/100ML IV SOLN
INTRAVENOUS | Status: AC
  Filled 2011-11-25: qty 400

## 2011-11-25 MED ORDER — MIDAZOLAM HCL 10 MG/2ML IJ SOLN
INTRAMUSCULAR | Status: DC | PRN
  Administered 2011-11-25: 1 mg via INTRAVENOUS
  Administered 2011-11-25 (×3): 2 mg via INTRAVENOUS

## 2011-11-25 MED ORDER — INSULIN ASPART 100 UNIT/ML ~~LOC~~ SOLN
0.0000 [IU] | Freq: Three times a day (TID) | SUBCUTANEOUS | Status: DC
  Administered 2011-11-25 – 2011-11-29 (×12): 2 [IU] via SUBCUTANEOUS

## 2011-11-25 MED ORDER — POTASSIUM CHLORIDE 10 MEQ/50ML IV SOLN: 10.0000 meq | INTRAVENOUS | Status: AC

## 2011-11-25 MED ORDER — PANTOPRAZOLE SODIUM 40 MG PO TBEC
40.0000 mg | DELAYED_RELEASE_TABLET | Freq: Every day | ORAL | Status: DC
  Administered 2011-11-25 – 2011-11-29 (×5): 40 mg via ORAL
  Filled 2011-11-25 (×6): qty 1

## 2011-11-25 MED ORDER — FENTANYL CITRATE 0.05 MG/ML IJ SOLN
INTRAMUSCULAR | Status: AC
  Filled 2011-11-25: qty 2

## 2011-11-25 MED ORDER — MIDAZOLAM HCL 10 MG/2ML IJ SOLN
INTRAMUSCULAR | Status: AC
  Filled 2011-11-25: qty 2

## 2011-11-25 MED ORDER — FENTANYL CITRATE 0.05 MG/ML IJ SOLN
INTRAMUSCULAR | Status: DC | PRN
  Administered 2011-11-25 (×2): 25 ug via INTRAVENOUS

## 2011-11-25 NOTE — Progress Notes (Addendum)
Name: Amanda Cain MRN: [id number] DOB: January 15, 1946    LOS: 6   PULMONARY / CRITICAL CARE MEDICINE NOTE   HPI:   65 y/o AAF with DM, CAD, HTN, obesity, hypothyroidism and GERD. Admitted 6/22  for evaluation and management of lower abdominal pain and vomiting for 3 to 4 days.  CT scan of the abdomen showed gastric wall thickening, small to moderate ascites and omental stranding, cholelithiasis and 3 mm cystic duct stone, without evidence of acute cholecystitis. At admission she was also found to have a creatinine of 2.59. General surgery, GI and nephrology consults were called. Afternoon of 6/23  the patient experienced a syncopal episode  And then remained hypotensive and oliguric. Critical care consult was called (6/23) to help in the evaluation and management.   Lines and tubes: 6/24 rt i j cvl>> 6/28 GI scope - gastric ulcer (staff note)  Micro: 6/24 bc x 2>>>1/2 GPR>>> 6/24 uc>>>gnr>>>klebsiella pneumoniae>>>sens to rocephin (pan sens)                         >>>citrobacter koseri>>>sens to rocephin  6/24 Peritoneal fluid>>> no organisms>>> 6/24 Peritoneal fluid pathology>>>neg malignant cells, inflammation noted 6/24 Peritoneal fluid >>>wbc 9446, lymphs 12, neuts 85%  Abx: 6/24 rocephin (uti)>>> 6/24 flagyl (diarrhea) >>  Vital Signs: Temp:  [98.1 F (36.7 C)-98.4 F (36.9 C)] 98.2 F (36.8 C) (06/28 0800) Pulse Rate:  [99-124] 103  (06/28 0400) Resp:  [18-28] 22  (06/28 0400) BP: (120-157)/(51-86) 120/53 mmHg (06/28 0800) SpO2:  [94 %-100 %] 96 % (06/28 0750) Weight:  [263 lb 7.2 oz (119.5 kg)] 263 lb 7.2 oz (119.5 kg) (06/28 0017) FIO2 4lpm    Intake/Output Summary (Last 24 hours) at 11/25/11 1051 Last data filed at 11/25/11 0900  Gross per 24 hour  Intake   1280 ml  Output   2000 ml  Net   -720 ml        Physical Examination: General:  Awake, alert, no acute distress. Anxious. Neuro:  Oriented x 3. No focal neurological deficits. HEENT:  PERRLA, pink  conjunctivae, dry mucous membranes Neck:  Supple, no JVD  . Rt I J cvl noted. Cardiovascular:  RRR, no M/R/G.  Lungs:  Diminished breath sounds bilaterally. Clear to auscultation. upper airway wheezing. Abdomen:  Obese, prominent abdomen. Soft, nontender, distended, bowel sounds hypoactive Musculoskeletal:  Moves all extremities, 1+ generalized edema Skin:  No rash   Lab 11/20/11 1623 11/20/11 1241  PHART 7.316* 7.286*  PCO2ART 38.5 38.9  PO2ART 92.7 140.0*  HCO3 19.1* 18.0*  O2SAT 98.2 98.3    Lab 11/20/11 1708 11/20/11 1228 11/19/11 2155  TROPONINI -- <0.30 --  LATICACIDVEN 0.9 -- --  PROBNP -- -- 130.6*    Lab 11/25/11 0430 11/23/11 0430 11/22/11 0500  NA 133* 126* 125*  K 3.2* 4.4 4.5  CL 103 98 97  CO2 23 19 18*  BUN 27* 28* 35*  CREATININE 1.43* 1.37* 1.93*  GLUCOSE 129* 169* 186*    Lab 11/25/11 0430 11/23/11 0430 11/21/11 0145  HGB 8.9* 8.9* 9.8*  HCT 27.6* 26.9* 30.0*  WBC 16.1* 13.5* 10.9*  PLT 453* 382 316    RADIOLOGY  No results found.  Principal Problem:  *Septic shock Active Problems:  Acute kidney injury  Syncope and collapse  SBP (spontaneous bacterial peritonitis)  UTI (lower urinary tract infection)  Obesity (BMI 30.0-34.9)  Abdominal pain  Hyponatremia  Anemia  Moderate protein-calorie malnutrition  Diabetes mellitus  HTN (hypertension)  Hypothyroidism  Dyslipidemia   ASSESSMENT AND PLAN  66 year old female with PMH relevant for DM, HTN, CAD, obesity and hypothyroidism. Presented with abdominal pain and vomiting and found to be in acute renal failure at admission. A CT scan of the abdomen showed gastric wall thickening, small to moderate ascites and omental stranding. It also showed cholelithiasis and 3 mm cystic duct stone with no CT evidence of acute cholecystitis. Syncopal episode and developed persistent hypotension and anuria. She responded well to fluid resuscitation and vasopressors. Her acute renal failure is likely secondary  to ATN due to volume depletion and also medication related including ARB, diuretic and NSAIDs. Renal US neg for hydronephrosis.  Nephrology input appreciated.  Weaned off pressors.  Renal fxn resolved.  Concern for infection in abd vs. Cancer.  Positive for UTI with klebsiella, citrobacter.     ID and SEPSIS Hypotension, possible sepsis of abdominal origin.  Differential DDx includes SBP, cancer, UTI. S/p paracentesis 6/24 with 1.5 L fluid removed.  6/26 shock resolved.  LFT's within normal limits on admit. No hx of ETOH.  Gastric thickening noted on CT abd --? If related to hx of vomiting but concerning for gastric CA given ascites.   - 6/27 Fluid results c/w SBP.   - 6/28 - she has polymicrobial sepsis from poly sites - SBP and GNR UTI Plan: - - Rocephin and Flagyl for possible intraabdominal infection, UTI and SBP - [facility] to taper antibioptics based on course - - d/c stress steroids 6/27   RENAL Acute renal failure  Hyperkalemia  / Hypokalemia 6/28 Hyponatremia   Renal US 6/23 > negative  Lab 11/25/11 0430 11/23/11 0430 11/22/11 0500 11/21/11 1200 11/21/11 0145  CREATININE 1.43* 1.37* 1.93* 2.53* 2.81*    Lab 11/25/11 0430 11/23/11 0430 11/22/11 0500 11/21/11 1200 11/21/11 0145 11/19/11 2155  NA 133* 126* 125* 122* 123* --  K 3.2* 4.4 -- -- -- --  CL 103 98 97 92* 93* --  CO2 23 19 18* 18* 20 --  GLUCOSE 129* 169* 186* 173* 140* --  BUN 27* 28* 35* 40* 39* --  CREATININE 1.43* 1.37* 1.93* 2.53* 2.81* --  CALCIUM 8.4 7.8* 7.4* 7.8* 7.8* --  MG -- -- 1.6 -- -- 1.6  PHOS -- -- 3.0 -- 3.7 3.3     Intake/Output      06/27 0701 - 06/28 0700 06/28 0701 - 06/29 0700   P.O. 840    I.V. (mL/kg) 350 (2.9) 20 (0.2)   IV Piggyback 350 100   Total Intake(mL/kg) 1540 (12.9) 120 (1)   Urine (mL/kg/hr) 2025 (0.7) 275   Total Output 2025 275   Net -485 -155        Stool Occurrence 3 x     PLAN: - Nephrology  Following.  UOP improving 6/25 - Metformin and antihypertensives on  hold - Strict I&O's - KVO IVF, lasix x1 6/26, 6/27 - kcl IV (NPO for scope) 6/28   Mild hypoxemia with ATX from abd distension / 02 dep - Likely secondary to atelectasis. Possibly some fluid overload.  No acute infiltrates on X ray PLAN: - xopenex - pulmonary hygiene  GI and HEME  Lab 11/25/11 0430 11/23/11 0430 11/21/11 0145  HGB 8.9* 8.9* 9.8*  HCT 27.6* 26.9* 30.0*  WBC 16.1* 13.5* 10.9*  PLT 453* 382 316   Gi scope 6/28 - gastric ulcer - suspiciious for malignancy  PLAN - per GI service  - await bx results  -  ppi start 11/25/2011   - - PRBC for hgb </= 6.9gm%    - exceptions are   -  if ACS susepcted/confirmed then transfuse for hgb </= 8.0gm%,  or    -  If septic shock first 24h and scvo2 < 70% then transfuse for hgb </= 9.0gm%   - active bleeding with hemodynamic instability, then transfuse regardless of hemoglobin value   At at all times try to transfuse 1 unit prbc as possible with exception of active hemorrhage    ENDOCRINE DM Hypothyroidism PLAN: - Continue insulin sliding scale - synthroid   DVT prophylaxis PLAN: - DC heparin because of gastric ulcer though no gi bleed on 11/25/2011  - start SCD 11/25/11     Transfer to [facility] in am 6/29 0700 (Dr. Hartford Poli)    Dr. Brand Males, M.D., F.C.C.P Pulmonary and Critical Care Medicine Staff Physician [place] [facility] Pager: [phone], If no answer or between  15:00h - 7:00h: call [phone]  11/25/2011 6:45 PM

## 2011-11-25 NOTE — Progress Notes (Signed)
  Subjective: Feels much better today. She has had bm's  Objective: Vital signs in last 24 hours: Temp:  [98.1 F (36.7 C)-98.4 F (36.9 C)] 98.2 F (36.8 C) (06/28 0800) Pulse Rate:  [99-124] 103  (06/28 0400) Resp:  [18-28] 22  (06/28 0400) BP: (115-157)/(42-86) 124/51 mmHg (06/28 0400) SpO2:  [94 %-100 %] 96 % (06/28 0750) Weight:  [263 lb 7.2 oz (119.5 kg)] 263 lb 7.2 oz (119.5 kg) (06/28 0017) Last BM Date: 11/24/11  Intake/Output from previous day: 06/27 0701 - 06/28 0700 In: 1540 [P.O.:840; I.V.:350; IV Piggyback:350] Out: 2025 [Urine:2025] Intake/Output this shift:    GI: soft and nontender.  Lab Results:   Basename 11/25/11 0430 11/23/11 0430  WBC 16.1* 13.5*  HGB 8.9* 8.9*  HCT 27.6* 26.9*  PLT 453* 382   BMET  Basename 11/25/11 0430 11/23/11 0430  NA 133* 126*  K 3.2* 4.4  CL 103 98  CO2 23 19  GLUCOSE 129* 169*  BUN 27* 28*  CREATININE 1.43* 1.37*  CALCIUM 8.4 7.8*   PT/INR No results found for this basename: LABPROT:2,INR:2 in the last 72 hours ABG No results found for this basename: PHART:2,PCO2:2,PO2:2,HCO3:2 in the last 72 hours  Studies/Results: No results found.  Anti-infectives: Anti-infectives     Start     Dose/Rate Route Frequency Ordered Stop   11/21/11 0100   metroNIDAZOLE (FLAGYL) IVPB 500 mg        500 mg 100 mL/hr over 60 Minutes Intravenous Every 8 hours 11/21/11 0018     11/21/11 0100   cefTRIAXone (ROCEPHIN) 1 g in dextrose 5 % 50 mL IVPB        1 g 100 mL/hr over 30 Minutes Intravenous Every 24 hours 11/21/11 0038            Assessment/Plan: s/p Procedure(s) (LRB): ESOPHAGOGASTRODUODENOSCOPY (EGD) (N/A) Plan for EGD today  LOS: 6 days    TOTH III,Scott Fix S 11/25/2011

## 2011-11-25 NOTE — Interval H&P Note (Signed)
History and Physical Interval Note:  11/25/2011 1:45 PM  Amanda Cain  has presented today for surgery, with the diagnosis of Abnormal CT scan  The various methods of treatment have been discussed with the patient and family. After consideration of risks, benefits and other options for treatment, the patient has consented to  Procedure(s) (LRB): ESOPHAGOGASTRODUODENOSCOPY (EGD) (N/A) as a surgical intervention .  The patient's history has been reviewed, patient examined, no change in status, stable for surgery.  I have reviewed the patients' chart and labs.  Questions were answered to the patient's satisfaction.     Eilam Shrewsbury D

## 2011-11-25 NOTE — H&P (View-Only) (Signed)
  Subjective: Feels much better today. She has had bm's  Objective: Vital signs in last 24 hours: Temp:  [98.1 F (36.7 C)-98.4 F (36.9 C)] 98.2 F (36.8 C) (06/28 0800) Pulse Rate:  [99-124] 103  (06/28 0400) Resp:  [18-28] 22  (06/28 0400) BP: (115-157)/(42-86) 124/51 mmHg (06/28 0400) SpO2:  [94 %-100 %] 96 % (06/28 0750) Weight:  [263 lb 7.2 oz (119.5 kg)] 263 lb 7.2 oz (119.5 kg) (06/28 0017) Last BM Date: 11/24/11  Intake/Output from previous day: 06/27 0701 - 06/28 0700 In: 1540 [P.O.:840; I.V.:350; IV Piggyback:350] Out: 2025 [Urine:2025] Intake/Output this shift:    GI: soft and nontender.  Lab Results:   Basename 11/25/11 0430 11/23/11 0430  WBC 16.1* 13.5*  HGB 8.9* 8.9*  HCT 27.6* 26.9*  PLT 453* 382   BMET  Basename 11/25/11 0430 11/23/11 0430  NA 133* 126*  K 3.2* 4.4  CL 103 98  CO2 23 19  GLUCOSE 129* 169*  BUN 27* 28*  CREATININE 1.43* 1.37*  CALCIUM 8.4 7.8*   PT/INR No results found for this basename: LABPROT:2,INR:2 in the last 72 hours ABG No results found for this basename: PHART:2,PCO2:2,PO2:2,HCO3:2 in the last 72 hours  Studies/Results: No results found.  Anti-infectives: Anti-infectives     Start     Dose/Rate Route Frequency Ordered Stop   11/21/11 0100   metroNIDAZOLE (FLAGYL) IVPB 500 mg        500 mg 100 mL/hr over 60 Minutes Intravenous Every 8 hours 11/21/11 0018     11/21/11 0100   cefTRIAXone (ROCEPHIN) 1 g in dextrose 5 % 50 mL IVPB        1 g 100 mL/hr over 30 Minutes Intravenous Every 24 hours 11/21/11 0038            Assessment/Plan: s/p Procedure(s) (LRB): ESOPHAGOGASTRODUODENOSCOPY (EGD) (N/A) Plan for EGD today  LOS: 6 days    TOTH Cain,Amanda Colborn S 11/25/2011

## 2011-11-25 NOTE — Op Note (Signed)
[facility] [place], [place]  [address]  OPERATIVE PROCEDURE REPORT  PATIENT:  Amanda Cain, Amanda Cain  MR#:  [id number] BIRTHDATE:  1946/04/23  GENDER:  female ENDOSCOPIST:  Carol Ada, MD ASSISTANT:  Blase Mess and Ethelene Hal PROCEDURE DATE:  11/25/2011 PROCEDURE:  EGD with biopsy, [id number] ASA CLASS:  Class III INDICATIONS:  Abnormal CT scan MEDICATIONS:  Fentanyl 50 mcg IV, Versed 7 mg IV  DESCRIPTION OF PROCEDURE:   After the risks benefits and alternatives of the procedure were thoroughly explained, informed consent was obtained.  The [id number] endoscope was introduced through the mouth and advanced to the second portion of the duodenum, without limitations.  The instrument was slowly withdrawn as the mucosa was fully examined. <<PROCEDUREIMAGES>>  FINDINGS:  Upon entry into the gastric lumen there was a significant amount of retained gastric contents. Aggressive suctioning was performed and approximately 1 liter of gastric contents were removed. This allowed for visualization of a very irreguar large ulcer in the proximal gastric body, along the greater curvature, extending into the fundus. Multiple cold biopsies were obtained. No other abnormalities were found in the upper GI tract.    Retroflexion was not performed.  The scope was then withdrawn from the patient and the procedure terminated.  COMPLICATIONS:  None  IMPRESSION:  1) Large irregular ulcer in the proximal gastric body along the greater curvature extending into the fundus. RECOMMENDATIONS:  1) Await biopsy results  ______________________________ Carol Ada, MD  n. Lorrin MaisCarol Ada at 11/25/2011 02:11 PM  Ginette Pitman, [id number]

## 2011-11-26 DIAGNOSIS — N3000 Acute cystitis without hematuria: Secondary | ICD-10-CM

## 2011-11-26 DIAGNOSIS — A413 Sepsis due to Hemophilus influenzae: Secondary | ICD-10-CM

## 2011-11-26 DIAGNOSIS — K652 Spontaneous bacterial peritonitis: Secondary | ICD-10-CM

## 2011-11-26 DIAGNOSIS — R188 Other ascites: Secondary | ICD-10-CM

## 2011-11-26 LAB — CBC
HCT: 26.6 % — ABNORMAL LOW (ref 36.0–46.0)
Hemoglobin: 8.7 g/dL — ABNORMAL LOW (ref 12.0–15.0)
MCH: 26.4 pg (ref 26.0–34.0)
MCHC: 32.7 g/dL (ref 30.0–36.0)
MCV: 80.9 fL (ref 78.0–100.0)
Platelets: 472 10*3/uL — ABNORMAL HIGH (ref 150–400)
RBC: 3.29 MIL/uL — ABNORMAL LOW (ref 3.87–5.11)
RDW: 17.5 % — ABNORMAL HIGH (ref 11.5–15.5)
WBC: 12.4 10*3/uL — ABNORMAL HIGH (ref 4.0–10.5)

## 2011-11-26 LAB — GLUCOSE, CAPILLARY
Glucose-Capillary: 143 mg/dL — ABNORMAL HIGH (ref 70–99)
Glucose-Capillary: 133 mg/dL — ABNORMAL HIGH (ref 70–99)
Glucose-Capillary: 131 mg/dL — ABNORMAL HIGH (ref 70–99)

## 2011-11-26 LAB — BASIC METABOLIC PANEL
BUN: 20 mg/dL (ref 6–23)
CO2: 24 mEq/L (ref 19–32)
Calcium: 8.4 mg/dL (ref 8.4–10.5)
Chloride: 102 mEq/L (ref 96–112)
Creatinine, Ser: 1.16 mg/dL — ABNORMAL HIGH (ref 0.50–1.10)
GFR calc Af Amer: 55 mL/min — ABNORMAL LOW (ref >90)
GFR calc non Af Amer: 48 mL/min — ABNORMAL LOW (ref >90)
Glucose, Bld: 147 mg/dL — ABNORMAL HIGH (ref 70–99)
Potassium: 3.4 mEq/L — ABNORMAL LOW (ref 3.5–5.1)
Sodium: 133 mEq/L — ABNORMAL LOW (ref 135–145)

## 2011-11-26 LAB — MAGNESIUM: Magnesium: 1.8 mg/dL (ref 1.5–2.5)

## 2011-11-26 MED ORDER — POTASSIUM CHLORIDE CRYS ER 20 MEQ PO TBCR
60.0000 meq | EXTENDED_RELEASE_TABLET | Freq: Once | ORAL | Status: AC
  Administered 2011-11-26: 60 meq via ORAL
  Filled 2011-11-26: qty 3

## 2011-11-26 MED ORDER — LORAZEPAM 0.5 MG PO TABS
0.5000 mg | ORAL_TABLET | Freq: Once | ORAL | Status: AC
  Administered 2011-11-26: 0.5 mg via ORAL
  Filled 2011-11-26: qty 1

## 2011-11-26 NOTE — Progress Notes (Signed)
Subjective: No acute events.  Tolerating PO.  Objective: Vital signs in last 24 hours: Temp:  [97.4 F (36.3 C)-99.3 F (37.4 C)] 98.5 F (36.9 C) (06/29 0800) Pulse Rate:  [100-133] 108  (06/29 0600) Resp:  [13-30] 13  (06/29 0600) BP: (107-178)/(41-98) 108/52 mmHg (06/29 0600) SpO2:  [94 %-100 %] 97 % (06/29 0729) Weight:  [118.6 kg (261 lb 7.5 oz)-118.706 kg (261 lb 11.2 oz)] 118.6 kg (261 lb 7.5 oz) (06/29 0449) Last BM Date: 11/25/11  Intake/Output from previous day: 06/28 0701 - 06/29 0700 In: 1109.7 [P.O.:210; I.V.:549.7; IV Piggyback:350] Out: 1425 [id number] Intake/Output this shift:    General appearance: alert and no distress GI: abnormal findings:  ascites and distended  Lab Results:  Basename 11/26/11 0430 11/25/11 0430  WBC 12.4* 16.1*  HGB 8.7* 8.9*  HCT 26.6* 27.6*  PLT 472* 453*   BMET  Basename 11/26/11 0430 11/25/11 0430  NA 133* 133*  K 3.4* 3.2*  CL 102 103  CO2 24 23  GLUCOSE 147* 129*  BUN 20 27*  CREATININE 1.16* 1.43*  CALCIUM 8.4 8.4   LFT No results found for this basename: PROT,ALBUMIN,AST,ALT,ALKPHOS,BILITOT,BILIDIR,IBILI in the last 72 hours PT/INR No results found for this basename: LABPROT:2,INR:2 in the last 72 hours Hepatitis Panel No results found for this basename: HEPBSAG,HCVAB,HEPAIGM,HEPBIGM in the last 72 hours C-Diff No results found for this basename: CDIFFTOX:3 in the last 72 hours Fecal Lactopherrin No results found for this basename: FECLLACTOFRN in the last 72 hours  Studies/Results: No results found.  Medications:  Scheduled:   . antiseptic oral rinse  15 mL Mouth Rinse BID  . cefTRIAXone (ROCEPHIN)  IV  1 g Intravenous Q24H  . insulin aspart  0-15 Units Subcutaneous TID AC & HS  . levalbuterol  0.63 mg Nebulization Q6H  . levothyroxine  50 mcg Intravenous Q breakfast  . LORazepam  0.5 mg Oral Once  . metronidazole  500 mg Intravenous Q8H  . ondansetron  4 mg Intravenous Once  . pantoprazole  40 mg  Oral Q1200  . potassium chloride  10 mEq Intravenous Q1 Hr x 4  . potassium chloride  40 mEq Oral Once  . sodium chloride  3 mL Intravenous Q12H  . DISCONTD: heparin subcutaneous  5,000 Units Subcutaneous Q8H  . DISCONTD: insulin aspart  0-15 Units Subcutaneous Q4H  . DISCONTD: insulin aspart  0-15 Units Subcutaneous TID AC & HS   Continuous:   . sodium chloride 20 mL/hr at 11/25/11 2131    Assessment/Plan: 1) Large gastric ulceration.   2) SBP. 3) UTI.   The patient is stable.  I do not know if the patient has a malignant or benign process with the gastric ulcer.  Biopsies are pending.  Plan: 1) Await biopsies. 2) Continue with supportive care.  LOS: 7 days   Cloe Sockwell D 11/26/2011, 8:09 AM

## 2011-11-26 NOTE — Progress Notes (Signed)
DAILY PROGRESS NOTE                              [facility]  SUBJECTIVE: Feels much better, denies any complaints.  OBJECTIVE: BP 123/49  Pulse 111  Temp 98.5 F (36.9 C) (Oral)  Resp 21  Ht 4\' 11"  (1.499 m)  Wt 118.6 kg (261 lb 7.5 oz)  BMI 52.81 kg/m2  SpO2 100%  Intake/Output Summary (Last 24 hours) at 11/26/11 1101 Last data filed at 11/26/11 0900  Gross per 24 hour  Intake 1289.67 ml  Output   1150 ml  Net 139.67 ml                      Weight change: -0.794 kg (-1 lb 12 oz) Physical Exam: General: Alert and awake oriented x3 not in any acute distress. HEENT: anicteric sclera, pupils equal reactive to light and accommodation CVS: S1-S2 heard, no murmur rubs or gallops Chest: clear to auscultation bilaterally, no wheezing rales or rhonchi Abdomen:  normal bowel sounds, soft, nontender, nondistended, no organomegaly Neuro: Cranial nerves II-XII intact, no focal neurological deficits Extremities: no cyanosis, no clubbing or edema noted bilaterally   Lab Results:  Basename 11/26/11 0430 11/25/11 0430  NA 133* 133*  K 3.4* 3.2*  CL 102 103  CO2 24 23  GLUCOSE 147* 129*  BUN 20 27*  CREATININE 1.16* 1.43*  CALCIUM 8.4 8.4  MG 1.8 --  PHOS -- --   No results found for this basename: AST:2,ALT:2,ALKPHOS:2,BILITOT:2,PROT:2,ALBUMIN:2 in the last 72 hours No results found for this basename: LIPASE:2,AMYLASE:2 in the last 72 hours  Basename 11/26/11 0430 11/25/11 0430  WBC 12.4* 16.1*  NEUTROABS -- --  HGB 8.7* 8.9*  HCT 26.6* 27.6*  MCV 80.9 80.9  PLT 472* 453*   No results found for this basename: CKTOTAL:3,CKMB:3,CKMBINDEX:3,TROPONINI:3 in the last 72 hours No components found with this basename: POCBNP:3 No results found for this basename: DDIMER:2 in the last 72 hours No results found for this basename: HGBA1C:2 in the last 72 hours No results found for this basename: CHOL:2,HDL:2,LDLCALC:2,TRIG:2,CHOLHDL:2,LDLDIRECT:2 in  the last 72 hours No results found for this basename: TSH,T4TOTAL,FREET3,T3FREE,THYROIDAB in the last 72 hours No results found for this basename: VITAMINB12:2,FOLATE:2,FERRITIN:2,TIBC:2,IRON:2,RETICCTPCT:2 in the last 72 hours  Micro Results: Recent Results (from the past 240 hour(s))  MRSA PCR SCREENING     Status: Normal   Collection Time   11/20/11  2:00 PM      Component Value Range Status Comment   MRSA by PCR NEGATIVE  NEGATIVE Final   URINE CULTURE     Status: Normal   Collection Time   11/21/11 12:59 AM      Component Value Range Status Comment   Specimen Description URINE, CATHETERIZED   Final    Special Requests NONE   Final    Culture  Setup Time [id number]   Final    Colony Count 45,000 COLONIES/ML   Final    Culture     Final    Value: KLEBSIELLA PNEUMONIAE     CITROBACTER KOSERI   Report Status 11/24/2011 FINAL   Final    Organism ID, Bacteria KLEBSIELLA PNEUMONIAE   Final    Organism ID, Bacteria CITROBACTER KOSERI   Final   CULTURE, BLOOD (ROUTINE X 2)     Status: Normal   Collection Time   11/21/11  1:45 AM  Component Value Range Status Comment   Specimen Description BLOOD LEFT ANTECUBITAL   Final    Special Requests BOTTLES DRAWN AEROBIC ONLY 10CC   Final    Culture  Setup Time [id number]   Final    Culture     Final    Value: BACILLUS SPECIES     Note: Standardized susceptibility testing for this organism is not available.     Note: Gram Stain Report Called to,Read Back By and Verified With: Manson Allan @ 2152 ON 11/21/11 BY GOLLD   Report Status 11/22/2011 FINAL   Final   CULTURE, BLOOD (ROUTINE X 2)     Status: Normal (Preliminary result)   Collection Time   11/21/11  1:49 AM      Component Value Range Status Comment   Specimen Description BLOOD R HAND   Final    Special Requests BOTTLES DRAWN AEROBIC ONLY [facility]   Final    Culture  Setup Time [id number]   Final    Culture     Final    Value:        BLOOD CULTURE RECEIVED NO GROWTH TO DATE CULTURE  WILL BE HELD FOR 5 DAYS BEFORE ISSUING A FINAL NEGATIVE REPORT   Report Status PENDING   Incomplete   BODY FLUID CULTURE     Status: Normal   Collection Time   11/21/11  3:40 PM      Component Value Range Status Comment   Specimen Description PERITONEAL   Final    Special Requests Normal   Final    Gram Stain     Final    Value: NO WBC SEEN     NO ORGANISMS SEEN   Culture NO GROWTH 3 DAYS   Final    Report Status 11/25/2011 FINAL   Final     Studies/Results: Ct Abdomen Pelvis Wo Contrast  11/19/2011  *RADIOLOGY REPORT*  Clinical Data: 65 year old female with abdominal pelvic pain, nausea and vomiting.  History of hysterectomy  CT ABDOMEN AND PELVIS WITHOUT CONTRAST  Technique:  Multidetector CT imaging of the abdomen and pelvis was performed following the standard protocol without intravenous contrast.  Comparison: None  Findings: There is a small-moderate amount of ascites within the abdomen and pelvis. There is wall thickening of the majority of the stomach - nonspecific but neoplasm is not excluded. There is omental stranding identified and metastatic disease is not excluded.  The liver, spleen, adrenal glands, kidneys, and pancreas are unremarkable.  Gallstones are noted filling the gallbladder and a 3 mm stone is noted within the cystic duct.  There is no CT evidence of acute cholecystitis.  Please note that parenchymal abnormalities may be missed as intravenous contrast was not administered.  Sigmoid colonic diverticulosis noted without diverticulitis. The bladder is within normal limits.  There is no evidence of biliary dilatation or abdominal aortic aneurysm. The patient is status post hysterectomy.  No acute or suspicious bony abnormalities are noted.  IMPRESSION: Apparent gastric wall thickening - neoplasm is not excluded and further evaluation is recommended.  Small to moderate ascites and omental stranding - nonspecific but metastatic disease is not excluded.  Cholelithiasis and 3 mm  cystic duct stone - no CT evidence of acute cholecystitis.  Original Report Authenticated By: Lura Em, M.D.   Nm Pulmonary Perfusion  11/21/2011  *RADIOLOGY REPORT*  Clinical Data: Shortness of breath  NM PULMONARY PERFUSION PARTICULATE  Radiopharmaceutical: 3.4MILLI CURIE MAA TECHNETIUM TO 76M ALBUMIN AGGREGATED  Comparison: Chest x-ray from 11/21/2011  Findings: Multiplanar perfusion imaging shows no peripheral moderate or large perfusion defect in either lung.  Elevation of the right hemidiaphragm accounts for decreased activity at the right base.  Given the normal bilateral perfusion, ventilation imaging was not performed.  IMPRESSION: Normal bilateral lung perfusion.  Original Report Authenticated By: ERIC A. MANSELL, M.D.   US Renal Port  11/20/2011  *RADIOLOGY REPORT*  Clinical Data: Acute renal failure.  History diabetes hypertension.  RENAL/URINARY TRACT ULTRASOUND COMPLETE  Comparison:  Unenhanced CT abdomen and pelvis yesterday.  Findings:  Right Kidney:  No hydronephrosis.  Well-preserved cortex.  No shadowing calculi.  Normal size and parenchymal echotexture without focal abnormalities.  Approximately 10.7 cm in length.  Left Kidney:  Suboptimally imaged due to body habitus. No hydronephrosis.  Well-preserved cortex.  No shadowing calculi. Normal size and parenchymal echotexture without focal abnormalities.  Approximately 11.0 cm in length.  Bladder:  Decompressed by Foley catheter.  IMPRESSION: Normal examination with a caveat that the left kidney was not optimally imaged due to body habitus; however, the left kidney was normal in appearance on the unenhanced CT yesterday.  Original Report Authenticated By: Deniece Portela, M.D.   US Paracentesis  11/21/2011  *RADIOLOGY REPORT*  Clinical Data: Abdominal pain, acute renal failure, gastric wall thickening, omental stranding, ascites; request is made for diagnostic and therapeutic  paracentesis up to 2 liters.  ULTRASOUND GUIDED  DIAGNOSTIC AND THERAPEUTIC  PARACENTESIS  An ultrasound guided paracentesis was thoroughly discussed with the patient and questions answered.  The benefits, risks, alternatives and complications were also discussed.  The patient understands and wishes to proceed with the procedure.  Written consent was obtained.  Ultrasound was performed to localize and mark an adequate pocket of fluid in the left mid to lower quadrant of the abdomen.  The area was then prepped and draped in the normal sterile fashion.  1% Lidocaine was used for local anesthesia.  Under ultrasound guidance a 19 gauge Yueh catheter was introduced.  Paracentesis was performed.  The catheter was removed and a dressing applied.  Complications:  none  Findings:  A total of approximately 1.5 liters of slightly turbid, yellow fluid was removed.  A fluid sample was  sent for laboratory analysis.  IMPRESSION: Successful ultrasound guided diagnostic and therapeutic paracentesis yielding 1.5 liters of ascites.  Read by: Rowe Robert, P.A.-C  Original Report Authenticated By: Trecia Rogers, M.D.   Dg Chest Port 1 View  11/23/2011  *RADIOLOGY REPORT*  Clinical Data: Atelectasis.  PORTABLE CHEST - 1 VIEW  Comparison: 11/22/2011.  Findings: Right IJ central line tip projects over the SVC.  Heart size stable.  Lungs are low in volume with mild diffuse bilateral air space disease, as before.  IMPRESSION: Low lung volumes mild diffuse bilateral air space disease, possibly due to edema.  Original Report Authenticated By: Luretha Rued, M.D.   Dg Chest Port 1 View  11/22/2011  *RADIOLOGY REPORT*  Clinical Data: Follow up of atelectasis.  Coronary artery disease. Gastroesophageal reflux disease.  Diabetes.  PORTABLE CHEST - 1 VIEW  Comparison: 11/21/2011  Findings: Minimal motion degraded exam.  Right IJ central line unchanged tip at low SVC. Numerous leads and wires project over the chest.  Normal heart size.  Moderate hemidiaphragm elevation. No  pleural effusion or pneumothorax.  Low lung volumes.  Resultant pulmonary interstitial prominence.  Patchy areas of lower lobe predominant atelectasis bilaterally.  The chin overlies the apices.  IMPRESSION: Low lung volumes with patchy areas  of atelectasis.  Mildly motion degraded exam.  Original Report Authenticated By: Areta Haber, M.D.   Dg Chest Port 1 View  11/21/2011  *RADIOLOGY REPORT*  Clinical Data: Right CVP line placement.  PORTABLE CHEST - 1 VIEW  Comparison: 11/20/2011  Findings: Interval placement of a right central venous catheter with tip in the low SVC region.  No pneumothorax.  Shallow inspiration.  Mild cardiac enlargement.  Pulmonary vascularity is normal for shallow inspiration.  Atelectasis in the lung bases.  IMPRESSION: Right central venous catheter with tip over the low SVC region.  No pneumothorax.  Otherwise stable appearance of the chest.  Original Report Authenticated By: Neale Burly, M.D.   Dg Chest Port 1 View  11/20/2011  *RADIOLOGY REPORT*  Clinical Data: Syncope  PORTABLE CHEST - 1 VIEW  Comparison: 03/17/2011  Findings: Low volumes.  Moderate cardiomegaly.  Bibasilar atelectasis.  No pneumothorax. No edema.  IMPRESSION: Bibasilar atelectasis.  Original Report Authenticated By: Jamas Lav, M.D.   Dg Abd Acute W/chest  11/19/2011  *RADIOLOGY REPORT*  Clinical Data: Abdominal pain.  ACUTE ABDOMEN SERIES (ABDOMEN 2 VIEW & CHEST 1 VIEW)  Comparison: 03/17/2011  Findings: Chest radiograph demonstrates clear lungs.  Heart size is upper limits normal but unchanged.  Trachea is midline.  No evidence for free air.  Nonobstructive bowel gas pattern. Gas- filled loop of bowel in the left lower abdomen may be related to colon.  Degenerative changes in the lumbar spine.  There is joint space narrowing in both hips.  IMPRESSION: No acute chest findings.  Nonspecific abdominal bowel gas pattern.  There is one large gas filled bowel loop in the left lower abdomen.  Original  Report Authenticated By: Markus Daft, M.D.   Medications: Scheduled Meds:   . antiseptic oral rinse  15 mL Mouth Rinse BID  . cefTRIAXone (ROCEPHIN)  IV  1 g Intravenous Q24H  . insulin aspart  0-15 Units Subcutaneous TID AC & HS  . levalbuterol  0.63 mg Nebulization Q6H  . levothyroxine  50 mcg Intravenous Q breakfast  . LORazepam  0.5 mg Oral Once  . metronidazole  500 mg Intravenous Q8H  . ondansetron  4 mg Intravenous Once  . pantoprazole  40 mg Oral Q1200  . potassium chloride  10 mEq Intravenous Q1 Hr x 4  . potassium chloride  40 mEq Oral Once  . potassium chloride  60 mEq Oral Once  . sodium chloride  3 mL Intravenous Q12H  . DISCONTD: heparin subcutaneous  5,000 Units Subcutaneous Q8H  . DISCONTD: insulin aspart  0-15 Units Subcutaneous Q4H  . DISCONTD: insulin aspart  0-15 Units Subcutaneous TID AC & HS   Continuous Infusions:   . sodium chloride 20 mL/hr at 11/25/11 2131   PRN Meds:.alum & mag hydroxide-simeth, ondansetron (ZOFRAN) IV, DISCONTD: fentaNYL, DISCONTD: midazolam  ASSESSMENT & PLAN: Principal Problem:  *Septic shock Active Problems:  Obesity (BMI 30.0-34.9)  Abdominal pain  Hyponatremia  Anemia  Acute kidney injury  Moderate protein-calorie malnutrition  Diabetes mellitus  HTN (hypertension)  Hypothyroidism  Dyslipidemia  Syncope and collapse  SBP (spontaneous bacterial peritonitis)  UTI (lower urinary tract infection)  I assumed the care on 11/26/2011, patient is being under [place] pulmonary critical care service  Sepsis -Possible sepsis of abdominal origin, differential diagnoses include SBP, cancer and UTI. -For sure patient has a UTI, urine culture grew Klebsiella and Citrobacter, blood culture is no growth to date. -Patient on stress dose of steroids she was weaned off  of that. And we will taper antibiotics based on the course. -She is on Rocephin and Flagyl.  Acute renal failure -Likely secondary to hypotension and the settings of  sepsis. -Patient creatinine is improving, today creatinine is 1.1. -Nephrology following, metformin and antihypertensives on hold.  Gastric ulcer -Was seen previously on CT scan with gastric wall thickening. -EGD was done by Dr. Matthew Saras and showed gastric ulcer, biopsy pending, -Await biopsy results, continue the twice a day PPI  Ascites/SBP -Status post paracentesis and removal of 1.5 L of fluid, the cytology showed inflammatory cells. -Albumin was not obtained from the fluid sample -Cell differential showed total WBC of 9446, and 85% of it was neutrophils, that gives [place] ANC of 8029 cells/Cmm. -These findings along with fever, altered mental status suggest SBP. Anyway patient is on Rocephin and Flagyl.  Diabetes mellitus type 2 -Continue checking CBGs, SSI and carbohydrate modified diet.  Disposition -Telemetry, remains as inpatient.   LOS: 7 days   Amanda Cain A 11/26/2011, 11:01 AM

## 2011-11-26 NOTE — Progress Notes (Signed)
1 Day Post-Op  Subjective: She feels good. No complaints. Endo yesterday showed large gastric ulcer. Path pending  Objective: Vital signs in last 24 hours: Temp:  [97.4 F (36.3 C)-99.3 F (37.4 C)] 98.5 F (36.9 C) (06/29 0800) Pulse Rate:  [100-133] 108  (06/29 0600) Resp:  [13-30] 13  (06/29 0600) BP: (107-178)/(41-98) 108/52 mmHg (06/29 0600) SpO2:  [94 %-100 %] 97 % (06/29 0729) Weight:  [261 lb 7.5 oz (118.6 kg)-261 lb 11.2 oz (118.706 kg)] 261 lb 7.5 oz (118.6 kg) (06/29 0449) Last BM Date: 11/25/11  Intake/Output from previous day: 06/28 0701 - 06/29 0700 In: 1109.7 [P.O.:210; I.V.:549.7; IV Piggyback:350] Out: [id number] [Urine:1425] Intake/Output this shift:    GI: soft, nontender  Lab Results:   Basename 11/26/11 0430 11/25/11 0430  WBC 12.4* 16.1*  HGB 8.7* 8.9*  HCT 26.6* 27.6*  PLT 472* 453*   BMET  Basename 11/26/11 0430 11/25/11 0430  NA 133* 133*  K 3.4* 3.2*  CL 102 103  CO2 24 23  GLUCOSE 147* 129*  BUN 20 27*  CREATININE 1.16* 1.43*  CALCIUM 8.4 8.4   PT/INR No results found for this basename: LABPROT:2,INR:2 in the last 72 hours ABG No results found for this basename: PHART:2,PCO2:2,PO2:2,HCO3:2 in the last 72 hours  Studies/Results: No results found.  Anti-infectives: Anti-infectives     Start     Dose/Rate Route Frequency Ordered Stop   11/21/11 0100   metroNIDAZOLE (FLAGYL) IVPB 500 mg        500 mg 100 mL/hr over 60 Minutes Intravenous Every 8 hours 11/21/11 0018     11/21/11 0100   cefTRIAXone (ROCEPHIN) 1 g in dextrose 5 % 50 mL IVPB        1 g 100 mL/hr over 30 Minutes Intravenous Every 24 hours 11/21/11 0038            Assessment/Plan: s/p Procedure(s) (LRB): ESOPHAGOGASTRODUODENOSCOPY (EGD) (N/A) Await path Continue protonix  LOS: 7 days    TOTH III,Viann Nielson S 11/26/2011

## 2011-11-26 NOTE — Evaluation (Signed)
Physical Therapy Evaluation Patient Details Name: Amanda Cain MRN: [id number] DOB: March 23, 1946 Today's Date: 11/26/2011 Time: [id number] PT Time Calculation (min): 23 min  PT Assessment / Plan / Recommendation Clinical Impression  66 y.o. female admitted with lower abdominal pain. Dx: sepsis, ARF, gastric ulcer, DM2. Pt ambulated 3' x 2 with RW with assist of 2, distance limited by PT due to increased HR of 143.  Pt lives alone, may require ST-SNF prior to DC home due to decreased activity tolerance.     PT Assessment  Patient needs continued PT services    Follow Up Recommendations  Skilled nursing facility;Home health PT (HHPT vs SNF depending on progress)    Barriers to Discharge Decreased caregiver support pt lives alone    Equipment Recommendations  None recommended by PT    Recommendations for Other Services     Frequency Min 3X/week    Precautions / Restrictions Precautions Precautions: None Restrictions Weight Bearing Restrictions: No   Pertinent Vitals/Pain *2/10 abdominal pain RN aware**      Mobility  Transfers Transfers: Sit to Stand;Stand to Sit Sit to Stand: 1: +2 Total assist;With upper extremity assist;With armrests Sit to Stand: Patient Percentage: 60% Stand to Sit: 1: +2 Total assist;With armrests;With upper extremity assist Stand to Sit: Patient Percentage: 80% Details for Transfer Assistance: +2 for safety due to large body habitus, VCs for hand placement Ambulation/Gait Ambulation/Gait Assistance: 1: +2 Total assist Ambulation/Gait: Patient Percentage: 80% Ambulation Distance (Feet): 6 Feet (3' + 3' with seated rest on [facility]) Ambulation/Gait Assistance Details: 3' + 3' with bari RW from recliner to/from [employer] Ambulation DC'd by PT due to HR of 143 with walking.  Gait Pattern: Step-to pattern;Decreased step length - right;Decreased step length - left    Exercises     PT Diagnosis: Difficulty walking;Generalized weakness;Acute pain  PT Problem  List: Decreased activity tolerance;Cardiopulmonary status limiting activity;Obesity;Pain PT Treatment Interventions: DME instruction;Gait training;Functional mobility training;Therapeutic activities;Therapeutic exercise   PT Goals Acute Rehab PT Goals PT Goal Formulation: With patient Time For Goal Achievement: 12/10/11 Potential to Achieve Goals: Fair Pt will go Supine/Side to Sit: with min assist PT Goal: Supine/Side to Sit - Progress: Goal set today Pt will go Sit to Stand: with min assist PT Goal: Sit to Stand - Progress: Goal set today Pt will Ambulate: 16 - 50 feet;with rolling walker;with min assist PT Goal: Ambulate - Progress: Goal set today  Visit Information  Last PT Received On: 11/26/11 Assistance Needed: +2    Subjective Data  Subjective: I feel like I'm going to throw up.  Patient Stated Goal: return to prior level of function   Prior Functioning  Home Living Lives With: Alone Available Help at Discharge: Family Type of Home: Apartment Home Access: Level entry Home Layout: One level Bathroom Shower/Tub: Tub/shower unit Home Adaptive Equipment: Environmental consultant - rolling;Raised toilet seat with rails;Straight cane Prior Function Level of Independence: Independent with assistive device(s) (with cane) Driving: No Communication Communication: No difficulties    Cognition  Overall Cognitive Status: Appears within functional limits for tasks assessed/performed Arousal/Alertness: Awake/alert Orientation Level: Appears intact for tasks assessed Behavior During Session: [facility] for tasks performed    Extremity/Trunk Assessment Right Upper Extremity Assessment RUE ROM/Strength/Tone: [facility] for tasks assessed Left Upper Extremity Assessment LUE ROM/Strength/Tone: WFL for tasks assessed Right Lower Extremity Assessment RLE ROM/Strength/Tone: Within functional levels RLE Sensation: WFL - Light Touch RLE Coordination: WFL - gross/fine motor Left Lower Extremity Assessment LLE  ROM/Strength/Tone: Within functional levels LLE Sensation: WFL -  Light Touch LLE Coordination: WFL - gross/fine motor Trunk Assessment Trunk Assessment: Normal   Balance    End of Session PT - End of Session Activity Tolerance: Treatment limited secondary to medical complications (Comment) (HR 143 with activity) Patient left: in chair;with call bell/phone within reach  GP     Philomena Doheny 11/26/2011, 3:09 PM  [phone]

## 2011-11-27 ENCOUNTER — Inpatient Hospital Stay (HOSPITAL_COMMUNITY): Payer: Medicare Other

## 2011-11-27 DIAGNOSIS — N3000 Acute cystitis without hematuria: Secondary | ICD-10-CM

## 2011-11-27 DIAGNOSIS — K652 Spontaneous bacterial peritonitis: Secondary | ICD-10-CM

## 2011-11-27 DIAGNOSIS — A413 Sepsis due to Hemophilus influenzae: Secondary | ICD-10-CM

## 2011-11-27 DIAGNOSIS — R188 Other ascites: Secondary | ICD-10-CM

## 2011-11-27 LAB — GLUCOSE, CAPILLARY
Glucose-Capillary: 137 mg/dL — ABNORMAL HIGH (ref 70–99)
Glucose-Capillary: 126 mg/dL — ABNORMAL HIGH (ref 70–99)
Glucose-Capillary: 122 mg/dL — ABNORMAL HIGH (ref 70–99)
Glucose-Capillary: 126 mg/dL — ABNORMAL HIGH (ref 70–99)
Glucose-Capillary: 111 mg/dL — ABNORMAL HIGH (ref 70–99)

## 2011-11-27 LAB — BASIC METABOLIC PANEL
BUN: 11 mg/dL (ref 6–23)
CO2: 25 mEq/L (ref 19–32)
Calcium: 8.4 mg/dL (ref 8.4–10.5)
Chloride: 103 mEq/L (ref 96–112)
Creatinine, Ser: 0.88 mg/dL (ref 0.50–1.10)
GFR calc Af Amer: 77 mL/min — ABNORMAL LOW (ref >90)
GFR calc non Af Amer: 66 mL/min — ABNORMAL LOW (ref >90)
Glucose, Bld: 118 mg/dL — ABNORMAL HIGH (ref 70–99)
Potassium: 3.5 mEq/L (ref 3.5–5.1)
Sodium: 136 mEq/L (ref 135–145)

## 2011-11-27 LAB — CBC
HCT: 27.9 % — ABNORMAL LOW (ref 36.0–46.0)
Hemoglobin: 9.1 g/dL — ABNORMAL LOW (ref 12.0–15.0)
MCH: 26.5 pg (ref 26.0–34.0)
MCHC: 32.6 g/dL (ref 30.0–36.0)
MCV: 81.3 fL (ref 78.0–100.0)
Platelets: 461 10*3/uL — ABNORMAL HIGH (ref 150–400)
RBC: 3.43 MIL/uL — ABNORMAL LOW (ref 3.87–5.11)
RDW: 17.6 % — ABNORMAL HIGH (ref 11.5–15.5)
WBC: 13.1 10*3/uL — ABNORMAL HIGH (ref 4.0–10.5)

## 2011-11-27 LAB — CULTURE, BLOOD (ROUTINE X 2): Culture: NO GROWTH

## 2011-11-27 MED ORDER — LORAZEPAM 0.5 MG PO TABS
0.5000 mg | ORAL_TABLET | Freq: Once | ORAL | Status: AC
  Administered 2011-11-27: 0.5 mg via ORAL
  Filled 2011-11-27: qty 1

## 2011-11-27 MED ORDER — POTASSIUM CHLORIDE CRYS ER 20 MEQ PO TBCR
60.0000 meq | EXTENDED_RELEASE_TABLET | Freq: Once | ORAL | Status: AC
  Administered 2011-11-27: 60 meq via ORAL
  Filled 2011-11-27: qty 3

## 2011-11-27 MED ORDER — FUROSEMIDE 10 MG/ML IJ SOLN
40.0000 mg | Freq: Once | INTRAMUSCULAR | Status: AC
  Administered 2011-11-27: 40 mg via INTRAVENOUS
  Filled 2011-11-27: qty 4

## 2011-11-27 NOTE — Progress Notes (Signed)
CSW referred to CM due to [facility] needs.    Pete Pelt, LCSWA [employer] [phone]

## 2011-11-27 NOTE — Progress Notes (Signed)
Subjective: No acute events.  Anxious about the biopsy results.  Objective: Vital signs in last 24 hours: Temp:  [98 F (36.7 C)-98.4 F (36.9 C)] 98.1 F (36.7 C) (06/30 0600) Pulse Rate:  [96-143] 104  (06/30 0600) Resp:  [20-23] 20  (06/30 0600) BP: (147-155)/(67-94) 149/85 mmHg (06/30 0600) SpO2:  [94 %-100 %] 96 % (06/30 0600) Weight:  [118.6 kg (261 lb 7.5 oz)-124.8 kg (275 lb 2.2 oz)] 124.8 kg (275 lb 2.2 oz) (06/30 0600) Last BM Date: 11/27/11  Intake/Output from previous day: 06/29 0701 - 06/30 0700 In: 1125 [P.O.:905; I.V.:120; IV Piggyback:100] Out: 656 [Urine:653; Stool:3] Intake/Output this shift:    General appearance: alert and no distress GI: soft, non-tender; bowel sounds normal; no masses,  no organomegaly  Lab Results:  Basename 11/27/11 0500 11/26/11 0430 11/25/11 0430  WBC 13.1* 12.4* 16.1*  HGB 9.1* 8.7* 8.9*  HCT 27.9* 26.6* 27.6*  PLT 461* 472* 453*   BMET  Basename 11/27/11 0500 11/26/11 0430 11/25/11 0430  NA 136 133* 133*  K 3.5 3.4* 3.2*  CL 103 102 103  CO2 25 24 23   GLUCOSE 118* 147* 129*  BUN 11 20 27*  CREATININE 0.88 1.16* 1.43*  CALCIUM 8.4 8.4 8.4   LFT No results found for this basename: PROT,ALBUMIN,AST,ALT,ALKPHOS,BILITOT,BILIDIR,IBILI in the last 72 hours PT/INR No results found for this basename: LABPROT:2,INR:2 in the last 72 hours Hepatitis Panel No results found for this basename: HEPBSAG,HCVAB,HEPAIGM,HEPBIGM in the last 72 hours C-Diff No results found for this basename: CDIFFTOX:3 in the last 72 hours Fecal Lactopherrin No results found for this basename: FECLLACTOFRN in the last 72 hours  Studies/Results: No results found.  Medications:  Scheduled:   . antiseptic oral rinse  15 mL Mouth Rinse BID  . cefTRIAXone (ROCEPHIN)  IV  1 g Intravenous Q24H  . insulin aspart  0-15 Units Subcutaneous TID AC & HS  . levalbuterol  0.63 mg Nebulization Q6H  . levothyroxine  50 mcg Intravenous Q breakfast  . LORazepam   0.5 mg Oral Once  . metronidazole  500 mg Intravenous Q8H  . ondansetron  4 mg Intravenous Once  . pantoprazole  40 mg Oral Q1200  . potassium chloride  60 mEq Oral Once  . sodium chloride  3 mL Intravenous Q12H   Continuous:   . sodium chloride Stopped (11/26/11 1500)    Assessment/Plan: 1) Large gastric ulceration.  2) SBP.  3) UTI.    No new changes or recommendations at this time.  Plan:  1) Await biopsies.  2) Continue with supportive care.   LOS: 8 days   Japheth Diekman D 11/27/2011, 8:15 AM

## 2011-11-27 NOTE — Progress Notes (Signed)
2 Days Post-Op  Subjective: Complains of reflux  Objective: Vital signs in last 24 hours: Temp:  [98 F (36.7 C)-98.4 F (36.9 C)] 98.1 F (36.7 C) (06/30 0600) Pulse Rate:  [96-143] 104  (06/30 0600) Resp:  [20-23] 20  (06/30 0600) BP: (147-155)/(67-94) 149/85 mmHg (06/30 0600) SpO2:  [94 %-100 %] 96 % (06/30 0600) Weight:  [261 lb 7.5 oz (118.6 kg)-275 lb 2.2 oz (124.8 kg)] 275 lb 2.2 oz (124.8 kg) (06/30 0600) Last BM Date: 11/27/11  Intake/Output from previous day: 06/29 0701 - 06/30 0700 In: 1125 [P.O.:905; I.V.:120; IV Piggyback:100] Out: 656 [Urine:653; Stool:3] Intake/Output this shift: Total I/O In: -  Out: 4 [Urine:2; Stool:2]  GI: soft, nontender  Lab Results:   Basename 11/27/11 0500 11/26/11 0430  WBC 13.1* 12.4*  HGB 9.1* 8.7*  HCT 27.9* 26.6*  PLT 461* 472*   BMET  Basename 11/27/11 0500 11/26/11 0430  NA 136 133*  K 3.5 3.4*  CL 103 102  CO2 25 24  GLUCOSE 118* 147*  BUN 11 20  CREATININE 0.88 1.16*  CALCIUM 8.4 8.4   PT/INR No results found for this basename: LABPROT:2,INR:2 in the last 72 hours ABG No results found for this basename: PHART:2,PCO2:2,PO2:2,HCO3:2 in the last 72 hours  Studies/Results: No results found.  Anti-infectives: Anti-infectives     Start     Dose/Rate Route Frequency Ordered Stop   11/21/11 0100   metroNIDAZOLE (FLAGYL) IVPB 500 mg        500 mg 100 mL/hr over 60 Minutes Intravenous Every 8 hours 11/21/11 0018     11/21/11 0100   cefTRIAXone (ROCEPHIN) 1 g in dextrose 5 % 50 mL IVPB        1 g 100 mL/hr over 30 Minutes Intravenous Every 24 hours 11/21/11 0038            Assessment/Plan: s/p Procedure(s) (LRB): ESOPHAGOGASTRODUODENOSCOPY (EGD) (N/A) await path  LOS: 8 days    TOTH III,Mychelle Kendra S 11/27/2011

## 2011-11-27 NOTE — Progress Notes (Signed)
DAILY PROGRESS NOTE                              [facility]  SUBJECTIVE: Feels much better, denies any complaints. Very anxious about biopsy results.  OBJECTIVE: BP 149/85  Pulse 104  Temp 98.1 F (36.7 C) (Oral)  Resp 20  Ht 4\' 11"  (1.499 m)  Wt 124.8 kg (275 lb 2.2 oz)  BMI 55.57 kg/m2  SpO2 96%  Intake/Output Summary (Last 24 hours) at 11/27/11 1240 Last data filed at 11/27/11 0900  Gross per 24 hour  Intake    405 ml  Output    260 ml  Net    145 ml                      Weight change: -0.106 kg (-3.8 oz) Physical Exam: General: Alert and awake oriented x3 not in any acute distress. HEENT: anicteric sclera, pupils equal reactive to light and accommodation CVS: S1-S2 heard, no murmur rubs or gallops Chest: clear to auscultation bilaterally, no wheezing rales or rhonchi Abdomen:  normal bowel sounds, soft, nontender, nondistended, no organomegaly Neuro: Cranial nerves II-XII intact, no focal neurological deficits Extremities: no cyanosis, no clubbing or edema noted bilaterally   Lab Results:  Basename 11/27/11 0500 11/26/11 0430  NA 136 133*  K 3.5 3.4*  CL 103 102  CO2 25 24  GLUCOSE 118* 147*  BUN 11 20  CREATININE 0.88 1.16*  CALCIUM 8.4 8.4  MG -- 1.8  PHOS -- --   No results found for this basename: AST:2,ALT:2,ALKPHOS:2,BILITOT:2,PROT:2,ALBUMIN:2 in the last 72 hours No results found for this basename: LIPASE:2,AMYLASE:2 in the last 72 hours  Basename 11/27/11 0500 11/26/11 0430  WBC 13.1* 12.4*  NEUTROABS -- --  HGB 9.1* 8.7*  HCT 27.9* 26.6*  MCV 81.3 80.9  PLT 461* 472*   No results found for this basename: CKTOTAL:3,CKMB:3,CKMBINDEX:3,TROPONINI:3 in the last 72 hours No components found with this basename: POCBNP:3 No results found for this basename: DDIMER:2 in the last 72 hours No results found for this basename: HGBA1C:2 in the last 72 hours No results found for this basename:  CHOL:2,HDL:2,LDLCALC:2,TRIG:2,CHOLHDL:2,LDLDIRECT:2 in the last 72 hours No results found for this basename: TSH,T4TOTAL,FREET3,T3FREE,THYROIDAB in the last 72 hours No results found for this basename: VITAMINB12:2,FOLATE:2,FERRITIN:2,TIBC:2,IRON:2,RETICCTPCT:2 in the last 72 hours  Micro Results: Recent Results (from the past 240 hour(s))  MRSA PCR SCREENING     Status: Normal   Collection Time   11/20/11  2:00 PM      Component Value Range Status Comment   MRSA by PCR NEGATIVE  NEGATIVE Final   URINE CULTURE     Status: Normal   Collection Time   11/21/11 12:59 AM      Component Value Range Status Comment   Specimen Description URINE, CATHETERIZED   Final    Special Requests NONE   Final    Culture  Setup Time [id number]   Final    Colony Count 45,000 COLONIES/ML   Final    Culture     Final    Value: [place]   Report Status 11/24/2011 FINAL   Final    Organism ID, Bacteria KLEBSIELLA PNEUMONIAE   Final    Organism ID, Bacteria CITROBACTER KOSERI   Final   CULTURE, BLOOD (ROUTINE X 2)     Status: Normal   Collection Time  11/21/11  1:45 AM      Component Value Range Status Comment   Specimen Description BLOOD LEFT ANTECUBITAL   Final    Special Requests BOTTLES DRAWN AEROBIC ONLY 10CC   Final    Culture  Setup Time [id number]   Final    Culture     Final    Value: BACILLUS SPECIES     Note: Standardized susceptibility testing for this organism is not available.     Note: Gram Stain Report Called to,Read Back By and Verified With: Manson Allan @ 2152 ON 11/21/11 BY GOLLD   Report Status 11/22/2011 FINAL   Final   CULTURE, BLOOD (ROUTINE X 2)     Status: Normal   Collection Time   11/21/11  1:49 AM      Component Value Range Status Comment   Specimen Description BLOOD R HAND   Final    Special Requests BOTTLES DRAWN AEROBIC ONLY [facility]   Final    Culture  Setup Time 11/21/2011 09:28   Final    Culture NO GROWTH 5 DAYS   Final    Report  Status 11/27/2011 FINAL   Final   BODY FLUID CULTURE     Status: Normal   Collection Time   11/21/11  3:40 PM      Component Value Range Status Comment   Specimen Description PERITONEAL   Final    Special Requests Normal   Final    Gram Stain     Final    Value: NO WBC SEEN     NO ORGANISMS SEEN   Culture NO GROWTH 3 DAYS   Final    Report Status 11/25/2011 FINAL   Final     Studies/Results: Ct Abdomen Pelvis Wo Contrast  11/19/2011  *RADIOLOGY REPORT*  Clinical Data: 66 year old female with abdominal pelvic pain, nausea and vomiting.  History of hysterectomy  CT ABDOMEN AND PELVIS WITHOUT CONTRAST  Technique:  Multidetector CT imaging of the abdomen and pelvis was performed following the standard protocol without intravenous contrast.  Comparison: None  Findings: There is a small-moderate amount of ascites within the abdomen and pelvis. There is wall thickening of the majority of the stomach - nonspecific but neoplasm is not excluded. There is omental stranding identified and metastatic disease is not excluded.  The liver, spleen, adrenal glands, kidneys, and pancreas are unremarkable.  Gallstones are noted filling the gallbladder and a 3 mm stone is noted within the cystic duct.  There is no CT evidence of acute cholecystitis.  Please note that parenchymal abnormalities may be missed as intravenous contrast was not administered.  Sigmoid colonic diverticulosis noted without diverticulitis. The bladder is within normal limits.  There is no evidence of biliary dilatation or abdominal aortic aneurysm. The patient is status post hysterectomy.  No acute or suspicious bony abnormalities are noted.  IMPRESSION: Apparent gastric wall thickening - neoplasm is not excluded and further evaluation is recommended.  Small to moderate ascites and omental stranding - nonspecific but metastatic disease is not excluded.  Cholelithiasis and 3 mm cystic duct stone - no CT evidence of acute cholecystitis.  Original  Report Authenticated By: Lura Em, M.D.   Nm Pulmonary Perfusion  11/21/2011  *RADIOLOGY REPORT*  Clinical Data: Shortness of breath  NM PULMONARY PERFUSION PARTICULATE  Radiopharmaceutical: 3.4MILLI CURIE MAA TECHNETIUM TO 78M ALBUMIN AGGREGATED  Comparison: Chest x-ray from 11/21/2011  Findings: Multiplanar perfusion imaging shows no peripheral moderate or large perfusion defect in either lung.  Elevation of  the right hemidiaphragm accounts for decreased activity at the right base.  Given the normal bilateral perfusion, ventilation imaging was not performed.  IMPRESSION: Normal bilateral lung perfusion.  Original Report Authenticated By: ERIC A. MANSELL, M.D.   US Renal Port  11/20/2011  *RADIOLOGY REPORT*  Clinical Data: Acute renal failure.  History diabetes hypertension.  RENAL/URINARY TRACT ULTRASOUND COMPLETE  Comparison:  Unenhanced CT abdomen and pelvis yesterday.  Findings:  Right Kidney:  No hydronephrosis.  Well-preserved cortex.  No shadowing calculi.  Normal size and parenchymal echotexture without focal abnormalities.  Approximately 10.7 cm in length.  Left Kidney:  Suboptimally imaged due to body habitus. No hydronephrosis.  Well-preserved cortex.  No shadowing calculi. Normal size and parenchymal echotexture without focal abnormalities.  Approximately 11.0 cm in length.  Bladder:  Decompressed by Foley catheter.  IMPRESSION: Normal examination with a caveat that the left kidney was not optimally imaged due to body habitus; however, the left kidney was normal in appearance on the unenhanced CT yesterday.  Original Report Authenticated By: Deniece Portela, M.D.   US Paracentesis  11/21/2011  *RADIOLOGY REPORT*  Clinical Data: Abdominal pain, acute renal failure, gastric wall thickening, omental stranding, ascites; request is made for diagnostic and therapeutic  paracentesis up to 2 liters.  ULTRASOUND GUIDED DIAGNOSTIC AND THERAPEUTIC  PARACENTESIS  An ultrasound guided paracentesis  was thoroughly discussed with the patient and questions answered.  The benefits, risks, alternatives and complications were also discussed.  The patient understands and wishes to proceed with the procedure.  Written consent was obtained.  Ultrasound was performed to localize and mark an adequate pocket of fluid in the left mid to lower quadrant of the abdomen.  The area was then prepped and draped in the normal sterile fashion.  1% Lidocaine was used for local anesthesia.  Under ultrasound guidance a 19 gauge Yueh catheter was introduced.  Paracentesis was performed.  The catheter was removed and a dressing applied.  Complications:  none  Findings:  A total of approximately 1.5 liters of slightly turbid, yellow fluid was removed.  A fluid sample was  sent for laboratory analysis.  IMPRESSION: Successful ultrasound guided diagnostic and therapeutic paracentesis yielding 1.5 liters of ascites.  Read by: Rowe Robert, P.A.-C  Original Report Authenticated By: Trecia Rogers, M.D.   Dg Chest Port 1 View  11/23/2011  *RADIOLOGY REPORT*  Clinical Data: Atelectasis.  PORTABLE CHEST - 1 VIEW  Comparison: 11/22/2011.  Findings: Right IJ central line tip projects over the SVC.  Heart size stable.  Lungs are low in volume with mild diffuse bilateral air space disease, as before.  IMPRESSION: Low lung volumes mild diffuse bilateral air space disease, possibly due to edema.  Original Report Authenticated By: Luretha Rued, M.D.   Dg Chest Port 1 View  11/22/2011  *RADIOLOGY REPORT*  Clinical Data: Follow up of atelectasis.  Coronary artery disease. Gastroesophageal reflux disease.  Diabetes.  PORTABLE CHEST - 1 VIEW  Comparison: 11/21/2011  Findings: Minimal motion degraded exam.  Right IJ central line unchanged tip at low SVC. Numerous leads and wires project over the chest.  Normal heart size.  Moderate hemidiaphragm elevation. No pleural effusion or pneumothorax.  Low lung volumes.  Resultant pulmonary  interstitial prominence.  Patchy areas of lower lobe predominant atelectasis bilaterally.  The chin overlies the apices.  IMPRESSION: Low lung volumes with patchy areas of atelectasis.  Mildly motion degraded exam.  Original Report Authenticated By: Areta Haber, M.D.  Dg Chest Port 1 View  11/21/2011  *RADIOLOGY REPORT*  Clinical Data: Right CVP line placement.  PORTABLE CHEST - 1 VIEW  Comparison: 11/20/2011  Findings: Interval placement of a right central venous catheter with tip in the low SVC region.  No pneumothorax.  Shallow inspiration.  Mild cardiac enlargement.  Pulmonary vascularity is normal for shallow inspiration.  Atelectasis in the lung bases.  IMPRESSION: Right central venous catheter with tip over the low SVC region.  No pneumothorax.  Otherwise stable appearance of the chest.  Original Report Authenticated By: Neale Burly, M.D.   Dg Chest Port 1 View  11/20/2011  *RADIOLOGY REPORT*  Clinical Data: Syncope  PORTABLE CHEST - 1 VIEW  Comparison: 03/17/2011  Findings: Low volumes.  Moderate cardiomegaly.  Bibasilar atelectasis.  No pneumothorax. No edema.  IMPRESSION: Bibasilar atelectasis.  Original Report Authenticated By: Jamas Lav, M.D.   Dg Abd Acute W/chest  11/19/2011  *RADIOLOGY REPORT*  Clinical Data: Abdominal pain.  ACUTE ABDOMEN SERIES (ABDOMEN 2 VIEW & CHEST 1 VIEW)  Comparison: 03/17/2011  Findings: Chest radiograph demonstrates clear lungs.  Heart size is upper limits normal but unchanged.  Trachea is midline.  No evidence for free air.  Nonobstructive bowel gas pattern. Gas- filled loop of bowel in the left lower abdomen may be related to colon.  Degenerative changes in the lumbar spine.  There is joint space narrowing in both hips.  IMPRESSION: No acute chest findings.  Nonspecific abdominal bowel gas pattern.  There is one large gas filled bowel loop in the left lower abdomen.  Original Report Authenticated By: Markus Daft, M.D.   Medications: Scheduled  Meds:    . antiseptic oral rinse  15 mL Mouth Rinse BID  . cefTRIAXone (ROCEPHIN)  IV  1 g Intravenous Q24H  . insulin aspart  0-15 Units Subcutaneous TID AC & HS  . levalbuterol  0.63 mg Nebulization Q6H  . levothyroxine  50 mcg Intravenous Q breakfast  . LORazepam  0.5 mg Oral Once  . metronidazole  500 mg Intravenous Q8H  . ondansetron  4 mg Intravenous Once  . pantoprazole  40 mg Oral Q1200  . sodium chloride  3 mL Intravenous Q12H   Continuous Infusions:    . sodium chloride Stopped (11/26/11 1500)   PRN Meds:.alum & mag hydroxide-simeth, ondansetron (ZOFRAN) IV  ASSESSMENT & PLAN: Principal Problem:  *Septic shock Active Problems:  Obesity (BMI 30.0-34.9)  Abdominal pain  Hyponatremia  Anemia  Acute kidney injury  Moderate protein-calorie malnutrition  Diabetes mellitus  HTN (hypertension)  Hypothyroidism  Dyslipidemia  Syncope and collapse  SBP (spontaneous bacterial peritonitis)  UTI (lower urinary tract infection)  I assumed the care on 11/26/2011, patient is being under [facility] pulmonary critical care service  Sepsis -Possible sepsis of abdominal origin, differential diagnoses include SBP, cancer and UTI. -For sure patient has a UTI, urine culture grew Klebsiella and Citrobacter, blood culture is no growth to date. -Patient on stress dose of steroids she was weaned off of that. And we will taper antibiotics based on the course. -She is on Rocephin and Flagyl.  Acute renal failure -Likely secondary to hypotension and the settings of sepsis. -Patient creatinine is improving, today creatinine is 1.1. -Nephrology following, metformin and antihypertensives on hold.  Gastric ulcer -Was seen previously on CT scan with gastric wall thickening. -EGD was done by Dr. Matthew Saras and showed gastric ulcer, biopsy pending, -Await biopsy results, continue the twice a day PPI  Ascites/SBP -Status post paracentesis and  removal of 1.5 L of fluid, the cytology showed  inflammatory cells. -Albumin was not obtained from the fluid sample -Cell differential showed total WBC of 9446, and 85% of it was neutrophils, that gives [place] ANC of 8029 cells/Cmm. -These findings along with fever, altered mental status suggest SBP. Anyway patient is on Rocephin and Flagyl.  Diabetes mellitus type 2 -Continue checking CBGs, SSI and carbohydrate modified diet.  Disposition -remains as an inpatient, discontinue cardiac monitor.   LOS: 8 days   Saurabh Hettich A 11/27/2011, 12:40 PM

## 2011-11-28 DIAGNOSIS — E119 Type 2 diabetes mellitus without complications: Secondary | ICD-10-CM

## 2011-11-28 LAB — CBC
HCT: 28.1 % — ABNORMAL LOW (ref 36.0–46.0)
Hemoglobin: 9.2 g/dL — ABNORMAL LOW (ref 12.0–15.0)
MCH: 27.2 pg (ref 26.0–34.0)
MCHC: 32.7 g/dL (ref 30.0–36.0)
MCV: 83.1 fL (ref 78.0–100.0)
Platelets: 483 10*3/uL — ABNORMAL HIGH (ref 150–400)
RBC: 3.38 MIL/uL — ABNORMAL LOW (ref 3.87–5.11)
RDW: 17.8 % — ABNORMAL HIGH (ref 11.5–15.5)
WBC: 15.9 10*3/uL — ABNORMAL HIGH (ref 4.0–10.5)

## 2011-11-28 LAB — GLUCOSE, CAPILLARY
Glucose-Capillary: 131 mg/dL — ABNORMAL HIGH (ref 70–99)
Glucose-Capillary: 115 mg/dL — ABNORMAL HIGH (ref 70–99)
Glucose-Capillary: 130 mg/dL — ABNORMAL HIGH (ref 70–99)
Glucose-Capillary: 127 mg/dL — ABNORMAL HIGH (ref 70–99)

## 2011-11-28 LAB — BASIC METABOLIC PANEL
BUN: 9 mg/dL (ref 6–23)
CO2: 25 mEq/L (ref 19–32)
Calcium: 8.3 mg/dL — ABNORMAL LOW (ref 8.4–10.5)
Chloride: 101 mEq/L (ref 96–112)
Creatinine, Ser: 0.84 mg/dL (ref 0.50–1.10)
GFR calc Af Amer: 82 mL/min — ABNORMAL LOW (ref >90)
GFR calc non Af Amer: 70 mL/min — ABNORMAL LOW (ref >90)
Glucose, Bld: 159 mg/dL — ABNORMAL HIGH (ref 70–99)
Potassium: 4.3 mEq/L (ref 3.5–5.1)
Sodium: 133 mEq/L — ABNORMAL LOW (ref 135–145)

## 2011-11-28 MED ORDER — SODIUM CHLORIDE 0.9 % IJ SOLN
10.0000 mL | INTRAMUSCULAR | Status: DC | PRN
Start: 2011-11-28 — End: 2011-11-30
  Administered 2011-11-28: 20 mL
  Administered 2011-11-29 (×3): 10 mL

## 2011-11-28 MED ORDER — LEVALBUTEROL HCL 0.63 MG/3ML IN NEBU
0.6300 mg | INHALATION_SOLUTION | Freq: Two times a day (BID) | RESPIRATORY_TRACT | Status: DC
  Administered 2011-11-28 – 2011-11-30 (×4): 0.63 mg via RESPIRATORY_TRACT
  Filled 2011-11-28 (×6): qty 3

## 2011-11-28 MED ORDER — FUROSEMIDE 10 MG/ML IJ SOLN
40.0000 mg | Freq: Two times a day (BID) | INTRAMUSCULAR | Status: DC
  Administered 2011-11-28 – 2011-11-30 (×4): 40 mg via INTRAVENOUS
  Filled 2011-11-28 (×6): qty 4

## 2011-11-28 MED ORDER — TRAMADOL HCL 50 MG PO TABS
50.0000 mg | ORAL_TABLET | Freq: Once | ORAL | Status: AC
  Administered 2011-11-28: 50 mg via ORAL
  Filled 2011-11-28: qty 1

## 2011-11-28 MED ORDER — SODIUM CHLORIDE 0.9 % IJ SOLN
10.0000 mL | Freq: Two times a day (BID) | INTRAMUSCULAR | Status: DC
  Administered 2011-11-28: 10 mL

## 2011-11-28 MED ORDER — OXYCODONE-ACETAMINOPHEN 5-325 MG PO TABS
1.0000 | ORAL_TABLET | ORAL | Status: DC | PRN
  Administered 2011-11-28 (×2): 1 via ORAL
  Filled 2011-11-28 (×2): qty 1

## 2011-11-28 MED ORDER — ALPRAZOLAM 0.25 MG PO TABS
0.2500 mg | ORAL_TABLET | Freq: Two times a day (BID) | ORAL | Status: DC | PRN
  Administered 2011-11-29 (×2): 0.25 mg via ORAL
  Filled 2011-11-28 (×2): qty 1

## 2011-11-28 NOTE — Progress Notes (Signed)
3 Days Post-Op  Subjective: Had some abdominal pain last night.  Has been vomiting some.  Objective: Vital signs in last 24 hours: Temp:  [97.5 F (36.4 C)-98.5 F (36.9 C)] 98.3 F (36.8 C) (07/01 0600) Pulse Rate:  [98-123] 102  (07/01 0600) Resp:  [20] 20  (07/01 0600) BP: (135-161)/(84-88) 153/88 mmHg (07/01 0600) SpO2:  [98 %-100 %] 100 % (07/01 0600) Weight:  [274 lb 11.1 oz (124.6 kg)] 274 lb 11.1 oz (124.6 kg) (07/01 0600) Last BM Date: 11/27/11  Intake/Output from previous day: 06/30 0701 - 07/01 0700 In: 240 [P.O.:240] Out: 109 [Urine:5; Emesis/NG output:100; Stool:4] Intake/Output this shift: Total I/O In: 240 [P.O.:240] Out: -   PE: Abd-soft,obese, mild epigastric and lower abdominal tenderness  Lab Results:   Basename 11/28/11 0430 11/27/11 0500  WBC 15.9* 13.1*  HGB 9.2* 9.1*  HCT 28.1* 27.9*  PLT 483* 461*   BMET  Basename 11/28/11 0430 11/27/11 0500  NA 133* 136  K 4.3 3.5  CL 101 103  CO2 25 25  GLUCOSE 159* 118*  BUN 9 11  CREATININE 0.84 0.88  CALCIUM 8.3* 8.4   PT/INR No results found for this basename: LABPROT:2,INR:2 in the last 72 hours Comprehensive Metabolic Panel:    Component Value Date/Time   NA 133* 11/28/2011 0430   K 4.3 11/28/2011 0430   CL 101 11/28/2011 0430   CO2 25 11/28/2011 0430   BUN 9 11/28/2011 0430   CREATININE 0.84 11/28/2011 0430   GLUCOSE 159* 11/28/2011 0430   CALCIUM 8.3* 11/28/2011 0430   AST 21 11/20/2011 2240   ALT 12 11/20/2011 2240   ALKPHOS 65 11/20/2011 2240   BILITOT 0.3 11/20/2011 2240   PROT 5.1* 11/20/2011 2240   ALBUMIN 1.9* 11/22/2011 0500     Studies/Results: Dg Chest 2 View  11/27/2011  *RADIOLOGY REPORT*  Clinical Data: Shortness of breath  CHEST - 2 VIEW  Comparison: 11/23/2011  Findings: Right IJ central line stable.  Persistent low lung volumes.  Some improvement in the pulmonary vascular congestion. There is some patchy linear subsegmental atelectasis or residual infiltrates in both lung bases.  Mild  gaseous distention of the stomach.  No effusion.  Heart size normal.  IMPRESSION:  1.  Improving pulmonary vascular congestion. 2.  Residual bibasilar atelectasis.  Original Report Authenticated By: Trecia Rogers, M.D.    Anti-infectives: Anti-infectives     Start     Dose/Rate Route Frequency Ordered Stop   11/21/11 0100   metroNIDAZOLE (FLAGYL) IVPB 500 mg        500 mg 100 mL/hr over 60 Minutes Intravenous Every 8 hours 11/21/11 0018     11/21/11 0100   cefTRIAXone (ROCEPHIN) 1 g in dextrose 5 % 50 mL IVPB        1 g 100 mL/hr over 30 Minutes Intravenous Every 24 hours 11/21/11 0038            Assessment Principal Problem:  Large gastric ulcer-biopsy results pending Active Problems:  Obesity (BMI 30.0-34.9)  Abdominal pain  Anemia  Moderate protein-calorie malnutrition  Diabetes mellitus  UTI (lower urinary tract infection)    LOS: 9 days   Plan: Await EGD results.  If WBC keeps rising and abdominal pain persists, would repeat CT scan   Blake Vetrano J 11/28/2011

## 2011-11-28 NOTE — Progress Notes (Signed)
Spoke with patient and family member the importance of maintaining the use of SCD's and getting OOB.  Encouraged to be out of bed to bedside commode with staff assistance.  Two assist with cane to chair, tolerating well

## 2011-11-28 NOTE — Progress Notes (Signed)
Amanda Cline, MS, LPCA, [place] and writer stopped in to check on the patient on behalf of the Department of [place]. The patient said that she was feeling better than she had been and that she was awaiting some test results. She said that her sister and some friends had come by to visit her. Patient also inquired about social work services, and we encouraged her to let her nurse know this. Offered support and presence.  Nehemiah Settle MA, MED, LPCA, [place]

## 2011-11-28 NOTE — Plan of Care (Signed)
Problem: Phase I Progression Outcomes Goal: OOB as tolerated unless otherwise ordered Outcome: Progressing Patient assisted to chair and OOB multiple times to [facility].  Able to assist and shuffle walk with a cane and 2 assist

## 2011-11-28 NOTE — Progress Notes (Signed)
DAILY PROGRESS NOTE                              [facility]  SUBJECTIVE: Feels much better, had some abdominal pain, controlled with pain medications.. Very anxious about biopsy results.  OBJECTIVE: BP 153/88  Pulse 102  Temp 98.3 F (36.8 C) (Oral)  Resp 20  Ht 4\' 11"  (1.499 m)  Wt 124.6 kg (274 lb 11.1 oz)  BMI 55.48 kg/m2  SpO2 96%  Intake/Output Summary (Last 24 hours) at 11/28/11 1246 Last data filed at 11/28/11 0929  Gross per 24 hour  Intake    490 ml  Output    103 ml  Net    387 ml                      Weight change: 6 kg (13 lb 3.6 oz) Physical Exam: General: Alert and awake oriented x3 not in any acute distress. HEENT: anicteric sclera, pupils equal reactive to light and accommodation CVS: S1-S2 heard, no murmur rubs or gallops Chest: clear to auscultation bilaterally, no wheezing rales or rhonchi Abdomen:  normal bowel sounds, soft, nontender, nondistended, no organomegaly Neuro: Cranial nerves II-XII intact, no focal neurological deficits Extremities: no cyanosis, no clubbing, +1 pedal edema noted bilaterally   Lab Results:  Basename 11/28/11 0430 11/27/11 0500 11/26/11 0430  NA 133* 136 --  K 4.3 3.5 --  CL 101 103 --  CO2 25 25 --  GLUCOSE 159* 118* --  BUN 9 11 --  CREATININE 0.84 0.88 --  CALCIUM 8.3* 8.4 --  MG -- -- 1.8  PHOS -- -- --   No results found for this basename: AST:2,ALT:2,ALKPHOS:2,BILITOT:2,PROT:2,ALBUMIN:2 in the last 72 hours No results found for this basename: LIPASE:2,AMYLASE:2 in the last 72 hours  Basename 11/28/11 0430 11/27/11 0500  WBC 15.9* 13.1*  NEUTROABS -- --  HGB 9.2* 9.1*  HCT 28.1* 27.9*  MCV 83.1 81.3  PLT 483* 461*   No results found for this basename: CKTOTAL:3,CKMB:3,CKMBINDEX:3,TROPONINI:3 in the last 72 hours No components found with this basename: POCBNP:3 No results found for this basename: DDIMER:2 in the last 72 hours No results found for this basename: HGBA1C:2  in the last 72 hours No results found for this basename: CHOL:2,HDL:2,LDLCALC:2,TRIG:2,CHOLHDL:2,LDLDIRECT:2 in the last 72 hours No results found for this basename: TSH,T4TOTAL,FREET3,T3FREE,THYROIDAB in the last 72 hours No results found for this basename: VITAMINB12:2,FOLATE:2,FERRITIN:2,TIBC:2,IRON:2,RETICCTPCT:2 in the last 72 hours  Micro Results: Recent Results (from the past 240 hour(s))  MRSA PCR SCREENING     Status: Normal   Collection Time   11/20/11  2:00 PM      Component Value Range Status Comment   MRSA by PCR NEGATIVE  NEGATIVE Final   URINE CULTURE     Status: Normal   Collection Time   11/21/11 12:59 AM      Component Value Range Status Comment   Specimen Description URINE, CATHETERIZED   Final    Special Requests NONE   Final    Culture  Setup Time [id number]   Final    Colony Count 45,000 COLONIES/ML   Final    Culture     Final    Value: [place]   Report Status 11/24/2011 FINAL   Final    Organism ID, Bacteria KLEBSIELLA PNEUMONIAE   Final    Organism ID, Bacteria CITROBACTER KOSERI  Final   CULTURE, BLOOD (ROUTINE X 2)     Status: Normal   Collection Time   11/21/11  1:45 AM      Component Value Range Status Comment   Specimen Description BLOOD LEFT ANTECUBITAL   Final    Special Requests BOTTLES DRAWN AEROBIC ONLY 10CC   Final    Culture  Setup Time [id number]   Final    Culture     Final    Value: BACILLUS SPECIES     Note: Standardized susceptibility testing for this organism is not available.     Note: Gram Stain Report Called to,Read Back By and Verified With: Manson Allan @ 2152 ON 11/21/11 BY GOLLD   Report Status 11/22/2011 FINAL   Final   CULTURE, BLOOD (ROUTINE X 2)     Status: Normal   Collection Time   11/21/11  1:49 AM      Component Value Range Status Comment   Specimen Description BLOOD R HAND   Final    Special Requests BOTTLES DRAWN AEROBIC ONLY [facility]   Final    Culture  Setup Time 11/21/2011 09:28    Final    Culture NO GROWTH 5 DAYS   Final    Report Status 11/27/2011 FINAL   Final   BODY FLUID CULTURE     Status: Normal   Collection Time   11/21/11  3:40 PM      Component Value Range Status Comment   Specimen Description PERITONEAL   Final    Special Requests Normal   Final    Gram Stain     Final    Value: NO WBC SEEN     NO ORGANISMS SEEN   Culture NO GROWTH 3 DAYS   Final    Report Status 11/25/2011 FINAL   Final     Studies/Results: Ct Abdomen Pelvis Wo Contrast  11/19/2011  *RADIOLOGY REPORT*  Clinical Data: 66 year old female with abdominal pelvic pain, nausea and vomiting.  History of hysterectomy  CT ABDOMEN AND PELVIS WITHOUT CONTRAST  Technique:  Multidetector CT imaging of the abdomen and pelvis was performed following the standard protocol without intravenous contrast.  Comparison: None  Findings: There is Cain small-moderate amount of ascites within the abdomen and pelvis. There is wall thickening of the majority of the stomach - nonspecific but neoplasm is not excluded. There is omental stranding identified and metastatic disease is not excluded.  The liver, spleen, adrenal glands, kidneys, and pancreas are unremarkable.  Gallstones are noted filling the gallbladder and Cain 3 mm stone is noted within the cystic duct.  There is no CT evidence of acute cholecystitis.  Please note that parenchymal abnormalities may be missed as intravenous contrast was not administered.  Sigmoid colonic diverticulosis noted without diverticulitis. The bladder is within normal limits.  There is no evidence of biliary dilatation or abdominal aortic aneurysm. The patient is status post hysterectomy.  No acute or suspicious bony abnormalities are noted.  IMPRESSION: Apparent gastric wall thickening - neoplasm is not excluded and further evaluation is recommended.  Small to moderate ascites and omental stranding - nonspecific but metastatic disease is not excluded.  Cholelithiasis and 3 mm cystic duct stone -  no CT evidence of acute cholecystitis.  Original Report Authenticated By: Lura Em, M.D.   Nm Pulmonary Perfusion  11/21/2011  *RADIOLOGY REPORT*  Clinical Data: Shortness of breath  NM PULMONARY PERFUSION PARTICULATE  Radiopharmaceutical: 3.4MILLI CURIE MAA TECHNETIUM TO 73M ALBUMIN AGGREGATED  Comparison: Chest x-ray from  11/21/2011  Findings: Multiplanar perfusion imaging shows no peripheral moderate or large perfusion defect in either lung.  Elevation of the right hemidiaphragm accounts for decreased activity at the right base.  Given the normal bilateral perfusion, ventilation imaging was not performed.  IMPRESSION: Normal bilateral lung perfusion.  Original Report Authenticated By: ERIC Cain. MANSELL, M.D.   US Renal Port  11/20/2011  *RADIOLOGY REPORT*  Clinical Data: Acute renal failure.  History diabetes hypertension.  RENAL/URINARY TRACT ULTRASOUND COMPLETE  Comparison:  Unenhanced CT abdomen and pelvis yesterday.  Findings:  Right Kidney:  No hydronephrosis.  Well-preserved cortex.  No shadowing calculi.  Normal size and parenchymal echotexture without focal abnormalities.  Approximately 10.7 cm in length.  Left Kidney:  Suboptimally imaged due to body habitus. No hydronephrosis.  Well-preserved cortex.  No shadowing calculi. Normal size and parenchymal echotexture without focal abnormalities.  Approximately 11.0 cm in length.  Bladder:  Decompressed by Foley catheter.  IMPRESSION: Normal examination with Cain caveat that the left kidney was not optimally imaged due to body habitus; however, the left kidney was normal in appearance on the unenhanced CT yesterday.  Original Report Authenticated By: Deniece Portela, M.D.   US Paracentesis  11/21/2011  *RADIOLOGY REPORT*  Clinical Data: Abdominal pain, acute renal failure, gastric wall thickening, omental stranding, ascites; request is made for diagnostic and therapeutic  paracentesis up to 2 liters.  ULTRASOUND GUIDED DIAGNOSTIC AND THERAPEUTIC   PARACENTESIS  An ultrasound guided paracentesis was thoroughly discussed with the patient and questions answered.  The benefits, risks, alternatives and complications were also discussed.  The patient understands and wishes to proceed with the procedure.  Written consent was obtained.  Ultrasound was performed to localize and mark an adequate pocket of fluid in the left mid to lower quadrant of the abdomen.  The area was then prepped and draped in the normal sterile fashion.  1% Lidocaine was used for local anesthesia.  Under ultrasound guidance Cain 19 gauge Yueh catheter was introduced.  Paracentesis was performed.  The catheter was removed and Cain dressing applied.  Complications:  none  Findings:  Cain total of approximately 1.5 liters of slightly turbid, yellow fluid was removed.  Cain fluid sample was  sent for laboratory analysis.  IMPRESSION: Successful ultrasound guided diagnostic and therapeutic paracentesis yielding 1.5 liters of ascites.  Read by: Rowe Robert, P.Cain.-C  Original Report Authenticated By: Trecia Rogers, M.D.   Dg Chest Port 1 View  11/23/2011  *RADIOLOGY REPORT*  Clinical Data: Atelectasis.  PORTABLE CHEST - 1 VIEW  Comparison: 11/22/2011.  Findings: Right IJ central line tip projects over the SVC.  Heart size stable.  Lungs are low in volume with mild diffuse bilateral air space disease, as before.  IMPRESSION: Low lung volumes mild diffuse bilateral air space disease, possibly due to edema.  Original Report Authenticated By: Luretha Rued, M.D.   Dg Chest Port 1 View  11/22/2011  *RADIOLOGY REPORT*  Clinical Data: Follow up of atelectasis.  Coronary artery disease. Gastroesophageal reflux disease.  Diabetes.  PORTABLE CHEST - 1 VIEW  Comparison: 11/21/2011  Findings: Minimal motion degraded exam.  Right IJ central line unchanged tip at low SVC. Numerous leads and wires project over the chest.  Normal heart size.  Moderate hemidiaphragm elevation. No pleural effusion or  pneumothorax.  Low lung volumes.  Resultant pulmonary interstitial prominence.  Patchy areas of lower lobe predominant atelectasis bilaterally.  The chin overlies the apices.  IMPRESSION: Low lung volumes with  patchy areas of atelectasis.  Mildly motion degraded exam.  Original Report Authenticated By: Areta Haber, M.D.   Dg Chest Port 1 View  11/21/2011  *RADIOLOGY REPORT*  Clinical Data: Right CVP line placement.  PORTABLE CHEST - 1 VIEW  Comparison: 11/20/2011  Findings: Interval placement of Cain right central venous catheter with tip in the low SVC region.  No pneumothorax.  Shallow inspiration.  Mild cardiac enlargement.  Pulmonary vascularity is normal for shallow inspiration.  Atelectasis in the lung bases.  IMPRESSION: Right central venous catheter with tip over the low SVC region.  No pneumothorax.  Otherwise stable appearance of the chest.  Original Report Authenticated By: Neale Burly, M.D.   Dg Chest Port 1 View  11/20/2011  *RADIOLOGY REPORT*  Clinical Data: Syncope  PORTABLE CHEST - 1 VIEW  Comparison: 03/17/2011  Findings: Low volumes.  Moderate cardiomegaly.  Bibasilar atelectasis.  No pneumothorax. No edema.  IMPRESSION: Bibasilar atelectasis.  Original Report Authenticated By: Jamas Lav, M.D.   Dg Abd Acute W/chest  11/19/2011  *RADIOLOGY REPORT*  Clinical Data: Abdominal pain.  ACUTE ABDOMEN SERIES (ABDOMEN 2 VIEW & CHEST 1 VIEW)  Comparison: 03/17/2011  Findings: Chest radiograph demonstrates clear lungs.  Heart size is upper limits normal but unchanged.  Trachea is midline.  No evidence for free air.  Nonobstructive bowel gas pattern. Gas- filled loop of bowel in the left lower abdomen may be related to colon.  Degenerative changes in the lumbar spine.  There is joint space narrowing in both hips.  IMPRESSION: No acute chest findings.  Nonspecific abdominal bowel gas pattern.  There is one large gas filled bowel loop in the left lower abdomen.  Original Report Authenticated  By: Markus Daft, M.D.   Medications: Scheduled Meds:    . antiseptic oral rinse  15 mL Mouth Rinse BID  . cefTRIAXone (ROCEPHIN)  IV  1 g Intravenous Q24H  . furosemide  40 mg Intravenous Once  . insulin aspart  0-15 Units Subcutaneous TID AC & HS  . levalbuterol  0.63 mg Nebulization Q6H  . levothyroxine  50 mcg Intravenous Q breakfast  . LORazepam  0.5 mg Oral Once  . metronidazole  500 mg Intravenous Q8H  . ondansetron  4 mg Intravenous Once  . pantoprazole  40 mg Oral Q1200  . potassium chloride  60 mEq Oral Once  . sodium chloride  3 mL Intravenous Q12H  . traMADol  50 mg Oral Once   Continuous Infusions:    . sodium chloride 20 mL/hr (11/28/11 0516)   PRN Meds:.alum & mag hydroxide-simeth, ondansetron (ZOFRAN) IV, oxyCODONE-acetaminophen  ASSESSMENT & PLAN: Principal Problem:  *Septic shock Active Problems:  Obesity (BMI 30.0-34.9)  Abdominal pain  Hyponatremia  Anemia  Acute kidney injury  Moderate protein-calorie malnutrition  Diabetes mellitus  HTN (hypertension)  Hypothyroidism  Dyslipidemia  Syncope and collapse  SBP (spontaneous bacterial peritonitis)  UTI (lower urinary tract infection)  I assumed the care on 11/26/2011, patient is being under [facility] pulmonary critical care service  Sepsis -Possible sepsis of abdominal origin, differential diagnoses include SBP, cancer and UTI. -For sure patient has Cain UTI, urine culture grew Klebsiella and Citrobacter, blood culture is no growth to date. -Patient on stress dose of steroids she was weaned off of that. -She is on Rocephin and Flagyl.  Acute renal failure -Likely secondary to hypotension and the settings of sepsis. -Patient creatinine is improving, today creatinine is 0.8 -Nephrology signed off, metformin and antihypertensives on hold. -I  will add when necessary Lasix as patient has lower extremity edema.  Gastric ulcer -Was seen previously on CT scan with gastric wall thickening. -EGD was done by  Dr. Benson Norway and showed gastric ulcer, biopsy pending, -Await biopsy results, continue the twice Cain day PPI  Ascites/SBP -Status post paracentesis and removal of 1.5 L of fluid, the cytology showed inflammatory cells. -Albumin was not obtained from the fluid sample -Cell differential showed total WBC of 9446, and 85% of it was neutrophils, that gives [place] ANC of 8029 cells/Cmm. -These findings along with fever, altered mental status suggest SBP. Anyway patient is on Rocephin and Flagyl.  Diabetes mellitus type 2 -Continue checking CBGs, SSI and carbohydrate modified diet.  Disposition -remains as an inpatient.   LOS: 9 days   Amanda Cain 11/28/2011, 12:46 PM

## 2011-11-28 NOTE — Progress Notes (Signed)
Subjective: Complaining of ABM pain.  Objective: Vital signs in last 24 hours: Temp:  [97.5 F (36.4 C)-98.5 F (36.9 C)] 98.3 F (36.8 C) (07/01 0600) Pulse Rate:  [98-123] 102  (07/01 0600) Resp:  [20] 20  (07/01 0600) BP: (135-161)/(84-88) 153/88 mmHg (07/01 0600) SpO2:  [96 %-100 %] 100 % (07/01 0600) Weight:  [124.6 kg (274 lb 11.1 oz)] 124.6 kg (274 lb 11.1 oz) (07/01 0600) Last BM Date: 11/27/11  Intake/Output from previous day: 06/30 0701 - 07/01 0700 In: 240 [P.O.:240] Out: 109 [Urine:5; Emesis/NG output:100; Stool:4] Intake/Output this shift:    General appearance: alert and no distress GI: mild mid abdominal tenderness.  Lab Results:  Basename 11/28/11 0430 11/27/11 0500 11/26/11 0430  WBC 15.9* 13.1* 12.4*  HGB 9.2* 9.1* 8.7*  HCT 28.1* 27.9* 26.6*  PLT 483* 461* 472*   BMET  Basename 11/28/11 0430 11/27/11 0500 11/26/11 0430  NA 133* 136 133*  K 4.3 3.5 3.4*  CL 101 103 102  CO2 25 25 24   GLUCOSE 159* 118* 147*  BUN 9 11 20   CREATININE 0.84 0.88 1.16*  CALCIUM 8.3* 8.4 8.4   LFT No results found for this basename: PROT,ALBUMIN,AST,ALT,ALKPHOS,BILITOT,BILIDIR,IBILI in the last 72 hours PT/INR No results found for this basename: LABPROT:2,INR:2 in the last 72 hours Hepatitis Panel No results found for this basename: HEPBSAG,HCVAB,HEPAIGM,HEPBIGM in the last 72 hours C-Diff No results found for this basename: CDIFFTOX:3 in the last 72 hours Fecal Lactopherrin No results found for this basename: FECLLACTOFRN in the last 72 hours  Studies/Results: Dg Chest 2 View  11/27/2011  *RADIOLOGY REPORT*  Clinical Data: Shortness of breath  CHEST - 2 VIEW  Comparison: 11/23/2011  Findings: Right IJ central line stable.  Persistent low lung volumes.  Some improvement in the pulmonary vascular congestion. There is some patchy linear subsegmental atelectasis or residual infiltrates in both lung bases.  Mild gaseous distention of the stomach.  No effusion.  Heart  size normal.  IMPRESSION:  1.  Improving pulmonary vascular congestion. 2.  Residual bibasilar atelectasis.  Original Report Authenticated By: Trecia Rogers, M.D.    Medications:  Scheduled:   . antiseptic oral rinse  15 mL Mouth Rinse BID  . cefTRIAXone (ROCEPHIN)  IV  1 g Intravenous Q24H  . furosemide  40 mg Intravenous Once  . insulin aspart  0-15 Units Subcutaneous TID AC & HS  . levalbuterol  0.63 mg Nebulization Q6H  . levothyroxine  50 mcg Intravenous Q breakfast  . LORazepam  0.5 mg Oral Once  . metronidazole  500 mg Intravenous Q8H  . ondansetron  4 mg Intravenous Once  . pantoprazole  40 mg Oral Q1200  . potassium chloride  60 mEq Oral Once  . sodium chloride  3 mL Intravenous Q12H  . traMADol  50 mg Oral Once   Continuous:   . sodium chloride 20 mL/hr (11/28/11 0516)    Assessment/Plan: 1) Large gastric ulceration.  2) SBP.  3) UTI.   She complains of abdominal pain.  Okay to treat PRN for her pain.  Plan:  1) Await biopsies.  2) Pain meds PRN.   LOS: 9 days   Kathalene Sporer D 11/28/2011, 8:26 AM

## 2011-11-28 NOTE — Progress Notes (Signed)
CARE MANAGEMENT NOTE 11/28/2011  Patient:  Amanda Cain, Amanda Cain   Account Number:  [id number]  Date Initiated:  11/28/2011  Documentation initiated by:  Antrell Tipler  Subjective/Objective Assessment:   pt admitted with bladder outlet obstruction and abd pain, egd with bx done [id number] bx still pending, iv flds and ivabx     Action/Plan:   lives at home   Anticipated DC Date:  12/01/2011   Anticipated DC Plan:  HOME/SELF CARE  In-house referral  NA      DC Planning Services  NA      [facility] Choice  NA   Choice offered to / List presented to:  NA   DME arranged  NA      DME agency  NA     [place] arranged  NA      [place] agency  NA   Status of service:  In process, will continue to follow Medicare Important Message given?  NA - LOS <3 / Initial given by admissions (If response is "NO", the following Medicare IM given date fields will be blank) Date Medicare IM given:   Date Additional Medicare IM given:    Discharge Disposition:    Per UR Regulation:  Reviewed for med. necessity/level of care/duration of stay  If discussed at [place] of Stay Meetings, dates discussed:    Comments:  [id number] Velva Harman, RN, BSN, CCM No discharge needs present at time of this review Case Management [phone]

## 2011-11-28 NOTE — Progress Notes (Signed)
Kathline Magic NP notified of patient's complaint of abdominal pain

## 2011-11-28 NOTE — Progress Notes (Signed)
Patient seen and note written.

## 2011-11-28 NOTE — Progress Notes (Signed)
3 Days Post-Op  Subjective: Complains of ongoing abdominal pain,  She is eating some, but not much. Doesn't seem to make her worse.  Objective: Vital signs in last 24 hours: Temp:  [97.5 F (36.4 C)-98.5 F (36.9 C)] 98.3 F (36.8 C) (07/01 0600) Pulse Rate:  [98-123] 102  (07/01 0600) Resp:  [20] 20  (07/01 0600) BP: (135-161)/(84-88) 153/88 mmHg (07/01 0600) SpO2:  [98 %-100 %] 100 % (07/01 0600) Weight:  [124.6 kg (274 lb 11.1 oz)] 124.6 kg (274 lb 11.1 oz) (07/01 0600) Last BM Date: 11/27/11  Diet: carb modified. BP still up some, some tachycardia, mild hyponatremia, and ongoing WBC elevation.  H/H is stable.  Intake/Output from previous day: 06/30 0701 - 07/01 0700 In: 240 [P.O.:240] Out: 109 [Urine:5; Emesis/NG output:100; Stool:4] Intake/Output this shift: Total I/O In: 240 [P.O.:240] Out: -   General appearance: alert, cooperative, no distress and she complains of ongoing discomfort. Resp: clear to auscultation bilaterally and anterior, still on O2.   GI: soft, not really worse on palpation, morbidly obese, not really distended, up in chair for exam. Extremities: she has marked edema all over.  Lab Results:   Basename 11/28/11 0430 11/27/11 0500  WBC 15.9* 13.1*  HGB 9.2* 9.1*  HCT 28.1* 27.9*  PLT 483* 461*    BMET  Basename 11/28/11 0430 11/27/11 0500  NA 133* 136  K 4.3 3.5  CL 101 103  CO2 25 25  GLUCOSE 159* 118*  BUN 9 11  CREATININE 0.84 0.88  CALCIUM 8.3* 8.4   PT/INR No results found for this basename: LABPROT:2,INR:2 in the last 72 hours   Lab 11/22/11 0500  AST --  ALT --  ALKPHOS --  BILITOT --  PROT --  ALBUMIN 1.9*     Lipase     Component Value Date/Time   LIPASE 18 11/19/2011 1155     Studies/Results: Dg Chest 2 View  11/27/2011  *RADIOLOGY REPORT*  Clinical Data: Shortness of breath  CHEST - 2 VIEW  Comparison: 11/23/2011  Findings: Right IJ central line stable.  Persistent low lung volumes.  Some improvement in the  pulmonary vascular congestion. There is some patchy linear subsegmental atelectasis or residual infiltrates in both lung bases.  Mild gaseous distention of the stomach.  No effusion.  Heart size normal.  IMPRESSION:  1.  Improving pulmonary vascular congestion. 2.  Residual bibasilar atelectasis.  Original Report Authenticated By: Trecia Rogers, M.D.    Medications:    . antiseptic oral rinse  15 mL Mouth Rinse BID  . cefTRIAXone (ROCEPHIN)  IV  1 g Intravenous Q24H  . furosemide  40 mg Intravenous Once  . insulin aspart  0-15 Units Subcutaneous TID AC & HS  . levalbuterol  0.63 mg Nebulization Q6H  . levothyroxine  50 mcg Intravenous Q breakfast  . LORazepam  0.5 mg Oral Once  . metronidazole  500 mg Intravenous Q8H  . ondansetron  4 mg Intravenous Once  . pantoprazole  40 mg Oral Q1200  . potassium chloride  60 mEq Oral Once  . sodium chloride  3 mL Intravenous Q12H  . traMADol  50 mg Oral Once    Assessment/Plan   Abdominal pain with thickened gastric wall and omental stranding. Concern for gastric CA.  Endoscopy results showed retained stomach contents, about 1 liter, and large irregular ulcer proximal gastric body along the greater curvature going into the fundus.   Hypotension, still on Neo at 180mcg/min.  Hyponatremia  AODM  PCM  Obesity  Plan:  Await path, and go from there.  If she were to need surgery she will need a medical  and cardiology clearance.        LOS: 9 days    Gesselle Fitzsimons 11/28/2011

## 2011-11-29 ENCOUNTER — Ambulatory Visit: Payer: Medicare Other | Admitting: Hematology and Oncology

## 2011-11-29 ENCOUNTER — Ambulatory Visit: Payer: Medicare Other

## 2011-11-29 LAB — CBC
HCT: 28.6 % — ABNORMAL LOW (ref 36.0–46.0)
Hemoglobin: 9 g/dL — ABNORMAL LOW (ref 12.0–15.0)
MCH: 26.1 pg (ref 26.0–34.0)
MCHC: 31.5 g/dL (ref 30.0–36.0)
MCV: 82.9 fL (ref 78.0–100.0)
Platelets: 499 10*3/uL — ABNORMAL HIGH (ref 150–400)
RBC: 3.45 MIL/uL — ABNORMAL LOW (ref 3.87–5.11)
RDW: 18.1 % — ABNORMAL HIGH (ref 11.5–15.5)
WBC: 18.8 10*3/uL — ABNORMAL HIGH (ref 4.0–10.5)

## 2011-11-29 LAB — BASIC METABOLIC PANEL
BUN: 8 mg/dL (ref 6–23)
CO2: 26 mEq/L (ref 19–32)
Calcium: 8 mg/dL — ABNORMAL LOW (ref 8.4–10.5)
Chloride: 98 mEq/L (ref 96–112)
Creatinine, Ser: 0.86 mg/dL (ref 0.50–1.10)
GFR calc Af Amer: 79 mL/min — ABNORMAL LOW (ref >90)
GFR calc non Af Amer: 68 mL/min — ABNORMAL LOW (ref >90)
Glucose, Bld: 139 mg/dL — ABNORMAL HIGH (ref 70–99)
Potassium: 4 mEq/L (ref 3.5–5.1)
Sodium: 130 mEq/L — ABNORMAL LOW (ref 135–145)

## 2011-11-29 LAB — GLUCOSE, CAPILLARY
Glucose-Capillary: 119 mg/dL — ABNORMAL HIGH (ref 70–99)
Glucose-Capillary: 136 mg/dL — ABNORMAL HIGH (ref 70–99)
Glucose-Capillary: 91 mg/dL (ref 70–99)
Glucose-Capillary: 114 mg/dL — ABNORMAL HIGH (ref 70–99)

## 2011-11-29 MED ORDER — SUCRALFATE 1 G PO TABS
1.0000 g | ORAL_TABLET | Freq: Three times a day (TID) | ORAL | Status: DC
  Administered 2011-11-29 – 2011-11-30 (×5): 1 g via ORAL
  Filled 2011-11-29 (×11): qty 1

## 2011-11-29 MED ORDER — GLUCERNA SHAKE PO LIQD
237.0000 mL | Freq: Two times a day (BID) | ORAL | Status: DC
  Filled 2011-11-29 (×2): qty 237

## 2011-11-29 MED ORDER — PANTOPRAZOLE SODIUM 40 MG PO TBEC
40.0000 mg | DELAYED_RELEASE_TABLET | Freq: Two times a day (BID) | ORAL | Status: DC
  Administered 2011-11-29 – 2011-11-30 (×2): 40 mg via ORAL
  Filled 2011-11-29 (×5): qty 1

## 2011-11-29 NOTE — Progress Notes (Signed)
Spoke with IV team and they will be here after 7pm to initiate IV access

## 2011-11-29 NOTE — Progress Notes (Signed)
Nutrition Follow-up  Diet Order: CHO medium, intake 25% of meals yesterday,100% of meals today  - Pt developed third spacing of fluid. Pt's weight is up 33 pounds since admission. Pt had paracentesis on 6/24 with 1.5 L of yellow fluid removed. Pt had EGD on 6/28 which showed large irregular ulcer in the proximal gastric body which was found to be benign per pathology report. Pt with ongoing abdominal pain that started yesterday with some vomiting. Pt feeling better by avoiding dairy products. Met with pt who reports vomiting after every meal today and states this has been going on "for a while", unsure of time frame. Pt reports improvement in abdominal pain. Pt requesting anti-emetics from RN who was getting medications for pt.   Intervention: Anti-emetics per MD. Nursing to continue to encourage pt to ask for anti-emetics as needed. Pt with questions regarding diabetic diet - discussed sources of carbohydrate in diet with recommended portion sizes. Provided handout of this information. Glucerna shake BID when nausea resolved for additional protein intake.   Meds: Scheduled Meds:   . antiseptic oral rinse  15 mL Mouth Rinse BID  . cefTRIAXone (ROCEPHIN)  IV  1 g Intravenous Q24H  . furosemide  40 mg Intravenous BID  . insulin aspart  0-15 Units Subcutaneous TID AC & HS  . levalbuterol  0.63 mg Nebulization BID  . levothyroxine  50 mcg Intravenous Q breakfast  . metronidazole  500 mg Intravenous Q8H  . ondansetron  4 mg Intravenous Once  . pantoprazole  40 mg Oral BID AC  . sodium chloride  10-40 mL Intracatheter Q12H  . sodium chloride  3 mL Intravenous Q12H  . sucralfate  1 g Oral TID WC & HS  . DISCONTD: pantoprazole  40 mg Oral Q1200   Continuous Infusions:   . sodium chloride 20 mL/hr (11/28/11 0516)   PRN Meds:.ALPRAZolam, alum & mag hydroxide-simeth, ondansetron (ZOFRAN) IV, oxyCODONE-acetaminophen, sodium chloride  Labs:  CMP     Component Value Date/Time   NA 130* 11/29/2011  0510   K 4.0 11/29/2011 0510   CL 98 11/29/2011 0510   CO2 26 11/29/2011 0510   GLUCOSE 139* 11/29/2011 0510   BUN 8 11/29/2011 0510   CREATININE 0.86 11/29/2011 0510   CALCIUM 8.0* 11/29/2011 0510   PROT 5.1* 11/20/2011 2240   ALBUMIN 1.9* 11/22/2011 0500   AST 21 11/20/2011 2240   ALT 12 11/20/2011 2240   ALKPHOS 65 11/20/2011 2240   BILITOT 0.3 11/20/2011 2240   GFRNONAA 68* 11/29/2011 0510   GFRAA 79* 11/29/2011 0510   CBG (last 3)   Basename 11/29/11 1709 11/29/11 1148 11/29/11 0735  GLUCAP 91 136* 119*     Intake/Output Summary (Last 24 hours) at 11/29/11 1751 Last data filed at 11/29/11 1242  Gross per 24 hour  Intake    730 ml  Output   1575 ml  Net   -845 ml   Last BM - 11/28/11  Weight Status:   6/24 253 lb 15.5 oz 7/2 273 lb 9.5 oz  Re-estimated needs:  No changes. 1600-1700 calories 90-100g protein  Nutrition Dx: Inadequate oral intake r/t abdominal pain, nausea, vomiting AEB average intake between 25-100% of meals   Goal: Pt to consistently consume >75% of meals/supplements.   Monitor: Weights, labs, nausea/vomiting/abdominal pain  Glory Rosebush Pager: [phone]

## 2011-11-29 NOTE — Progress Notes (Signed)
Subjective: No acute events.  Objective: Vital signs in last 24 hours: Temp:  [98.4 F (36.9 C)-98.9 F (37.2 C)] 98.4 F (36.9 C) (07/02 0523) Pulse Rate:  [102-110] 102  (07/02 0523) Resp:  [19-20] 19  (07/02 0523) BP: (143-155)/(73-74) 144/73 mmHg (07/02 0523) SpO2:  [95 %-100 %] 95 % (07/02 0844) Weight:  [124.1 kg (273 lb 9.5 oz)] 124.1 kg (273 lb 9.5 oz) (07/02 0523) Last BM Date: 11/28/11  Intake/Output from previous day: 07/01 0701 - 07/02 0700 In: 1440 [P.O.:480; IV Piggyback:960] Out: 125 [Urine:125] Intake/Output this shift: Total I/O In: 240 [P.O.:240] Out: 1450 [Urine:1450]  General appearance: alert and no distress GI: soft, non-tender; bowel sounds normal; no masses,  no organomegaly  Lab Results:  Basename 11/29/11 0510 11/28/11 0430 11/27/11 0500  WBC 18.8* 15.9* 13.1*  HGB 9.0* 9.2* 9.1*  HCT 28.6* 28.1* 27.9*  PLT 499* 483* 461*   BMET  Basename 11/29/11 0510 11/28/11 0430 11/27/11 0500  NA 130* 133* 136  K 4.0 4.3 3.5  CL 98 101 103  CO2 26 25 25   GLUCOSE 139* 159* 118*  BUN 8 9 11   CREATININE 0.86 0.84 0.88  CALCIUM 8.0* 8.3* 8.4   LFT No results found for this basename: PROT,ALBUMIN,AST,ALT,ALKPHOS,BILITOT,BILIDIR,IBILI in the last 72 hours PT/INR No results found for this basename: LABPROT:2,INR:2 in the last 72 hours Hepatitis Panel No results found for this basename: HEPBSAG,HCVAB,HEPAIGM,HEPBIGM in the last 72 hours C-Diff No results found for this basename: CDIFFTOX:3 in the last 72 hours Fecal Lactopherrin No results found for this basename: FECLLACTOFRN in the last 72 hours  Studies/Results: No results found.  Medications:  Scheduled:   . antiseptic oral rinse  15 mL Mouth Rinse BID  . cefTRIAXone (ROCEPHIN)  IV  1 g Intravenous Q24H  . furosemide  40 mg Intravenous BID  . insulin aspart  0-15 Units Subcutaneous TID AC & HS  . levalbuterol  0.63 mg Nebulization BID  . levothyroxine  50 mcg Intravenous Q breakfast  .  metronidazole  500 mg Intravenous Q8H  . ondansetron  4 mg Intravenous Once  . pantoprazole  40 mg Oral BID AC  . sodium chloride  10-40 mL Intracatheter Q12H  . sodium chloride  3 mL Intravenous Q12H  . sucralfate  1 g Oral TID WC & HS  . DISCONTD: pantoprazole  40 mg Oral Q1200   Continuous:   . sodium chloride 20 mL/hr (11/28/11 0516)    Assessment/Plan: 1) Gastric ulcer - Benign.   I discussed the results of the gastric biopsies with the patient and it is benign.  She appears to be stable at this time.  I am uncertain why she had ascites, but she did have a complex presentation.  Plan: 1) PPI+sucralfate. 2) Follow up in the office in one month. 3) Repeat EGD as an outpatient to ensure complete resolution of the ulcer as it is very atypical in its appearance. 4) Signing off.  LOS: 10 days   Lavren Lewan D 11/29/2011, 2:39 PM

## 2011-11-29 NOTE — Progress Notes (Signed)
Patient seen and examined.  She states she feels better and is eating better as long as she avoids dairy products.  Her right IJ central line has been in 8 days and could be the source of her leukocytosis.  Pathology is consistent with gastritis, no malignancy.  Will d/c central line, increase Protonix to bid, recheck WBC tomorrow.

## 2011-11-29 NOTE — Progress Notes (Signed)
Physical Therapy Treatment Patient Details Name: Amanda Cain MRN: [id number] DOB: [birth year] Today's Date: 11/29/2011 Time: 1350-1403 PT Time Calculation (min): 13 min  PT Assessment / Plan / Recommendation Comments on Treatment Session  Progressing with mobility. Limited activity tolerance.     Follow Up Recommendations  Skilled nursing facility vs Home health PT ((depending on progress))    Barriers to Discharge        Equipment Recommendations       Recommendations for Other Services    Frequency Min 3X/week   Plan Discharge plan remains appropriate    Precautions / Restrictions Precautions Precautions: Fall Restrictions Weight Bearing Restrictions: No   Pertinent Vitals/Pain     Mobility  Bed Mobility Bed Mobility: Sit to Supine Sit to Supine: HOB flat;With rail;3: Mod assist Details for Bed Mobility Assistance: Assist for bil LEs onto bed and controlled descent of trunk to supine. Transfers Transfers: Sit to Stand;Stand to Sit Sit to Stand: 1: +2 Total assist;With upper extremity assist;From chair/3-in-1 Sit to Stand: Patient Percentage: 80% Stand to Sit: 1: +2 Total assist;With upper extremity assist;To chair/3-in-1;To bed Stand to Sit: Patient Percentage: 80% Details for Transfer Assistance: x2. VCs safety, technique, hand placement. +2 assist for safety.  Ambulation/Gait Ambulation/Gait Assistance: 1: +2 Total assist Ambulation/Gait: Patient Percentage: 80% Ambulation Distance (Feet): 35 Feet (x 2. ) Assistive device: Rolling walker Ambulation/Gait Assistance Details: VCs safety. +2 for safety. Ambulation on O2. Dsypnea 3/4. Followed with recliner. Seated rest break needed.  Gait Pattern: Decreased stride length;Decreased step length - left;Decreased step length - right    Exercises     PT Diagnosis:    PT Problem List:   PT Treatment Interventions:     PT Goals Acute Rehab PT Goals PT Goal: Sit to Stand - Progress: Progressing toward goal PT Goal:  Ambulate - Progress: Progressing toward goal  Visit Information  Last PT Received On: 11/29/11 Assistance Needed: +2    Subjective Data  Subjective: "I haven't walked yet" Patient Stated Goal: Get better   Cognition  Overall Cognitive Status: Appears within functional limits for tasks assessed/performed Arousal/Alertness: Awake/alert Orientation Level: Appears intact for tasks assessed Behavior During Session: [facility] for tasks performed    Balance     End of Session PT - End of Session Activity Tolerance: Patient limited by fatigue Patient left: with call bell/phone within reach;with family/visitor present   GP     Weston Anna [facility] 11/29/2011, 3:12 PM [phone]

## 2011-11-29 NOTE — Progress Notes (Signed)
DAILY PROGRESS NOTE                              [facility]  SUBJECTIVE: Feels much better, had some abdominal pain, controlled with pain medications.. Very anxious about biopsy results.  OBJECTIVE: BP 144/73  Pulse 102  Temp 98.4 F (36.9 C) (Oral)  Resp 19  Ht 4\' 11"  (1.499 m)  Wt 124.1 kg (273 lb 9.5 oz)  BMI 55.26 kg/m2  SpO2 95%  Intake/Output Summary (Last 24 hours) at 11/29/11 1457 Last data filed at 11/29/11 1242  Gross per 24 hour  Intake   1430 ml  Output   1575 ml  Net   -145 ml                      Weight change: -0.5 kg (-1 lb 1.6 oz) Physical Exam: General: Alert and awake oriented x3 not in any acute distress. HEENT: anicteric sclera, pupils equal reactive to light and accommodation CVS: S1-S2 heard, no murmur rubs or gallops Chest: clear to auscultation bilaterally, no wheezing rales or rhonchi Abdomen:  normal bowel sounds, soft, nontender, nondistended, no organomegaly Neuro: Cranial nerves II-XII intact, no focal neurological deficits Extremities: no cyanosis, no clubbing, +1 pedal edema noted bilaterally   Lab Results:  Basename 11/29/11 0510 11/28/11 0430  NA 130* 133*  K 4.0 4.3  CL 98 101  CO2 26 25  GLUCOSE 139* 159*  BUN 8 9  CREATININE 0.86 0.84  CALCIUM 8.0* 8.3*  MG -- --  PHOS -- --   No results found for this basename: AST:2,ALT:2,ALKPHOS:2,BILITOT:2,PROT:2,ALBUMIN:2 in the last 72 hours No results found for this basename: LIPASE:2,AMYLASE:2 in the last 72 hours  Basename 11/29/11 0510 11/28/11 0430  WBC 18.8* 15.9*  NEUTROABS -- --  HGB 9.0* 9.2*  HCT 28.6* 28.1*  MCV 82.9 83.1  PLT 499* 483*   No results found for this basename: CKTOTAL:3,CKMB:3,CKMBINDEX:3,TROPONINI:3 in the last 72 hours No components found with this basename: POCBNP:3 No results found for this basename: DDIMER:2 in the last 72 hours No results found for this basename: HGBA1C:2 in the last 72 hours No results found for  this basename: CHOL:2,HDL:2,LDLCALC:2,TRIG:2,CHOLHDL:2,LDLDIRECT:2 in the last 72 hours No results found for this basename: TSH,T4TOTAL,FREET3,T3FREE,THYROIDAB in the last 72 hours No results found for this basename: VITAMINB12:2,FOLATE:2,FERRITIN:2,TIBC:2,IRON:2,RETICCTPCT:2 in the last 72 hours  Micro Results: Recent Results (from the past 240 hour(s))  MRSA PCR SCREENING     Status: Normal   Collection Time   11/20/11  2:00 PM      Component Value Range Status Comment   MRSA by PCR NEGATIVE  NEGATIVE Final   URINE CULTURE     Status: Normal   Collection Time   11/21/11 12:59 AM      Component Value Range Status Comment   Specimen Description URINE, CATHETERIZED   Final    Special Requests NONE   Final    Culture  Setup Time [id number]   Final    Colony Count 45,000 COLONIES/ML   Final    Culture     Final    Value: [place]   Report Status 11/24/2011 FINAL   Final    Organism ID, Bacteria KLEBSIELLA PNEUMONIAE   Final    Organism ID, Bacteria CITROBACTER KOSERI   Final   CULTURE, BLOOD (ROUTINE X 2)     Status: Normal  Collection Time   11/21/11  1:45 AM      Component Value Range Status Comment   Specimen Description BLOOD LEFT ANTECUBITAL   Final    Special Requests BOTTLES DRAWN AEROBIC ONLY 10CC   Final    Culture  Setup Time [id number]   Final    Culture     Final    Value: BACILLUS SPECIES     Note: Standardized susceptibility testing for this organism is not available.     Note: Gram Stain Report Called to,Read Back By and Verified With: Manson Allan @ 2152 ON 11/21/11 BY GOLLD   Report Status 11/22/2011 FINAL   Final   CULTURE, BLOOD (ROUTINE X 2)     Status: Normal   Collection Time   11/21/11  1:49 AM      Component Value Range Status Comment   Specimen Description BLOOD R HAND   Final    Special Requests BOTTLES DRAWN AEROBIC ONLY [facility]   Final    Culture  Setup Time 11/21/2011 09:28   Final    Culture NO GROWTH 5 DAYS   Final     Report Status 11/27/2011 FINAL   Final   BODY FLUID CULTURE     Status: Normal   Collection Time   11/21/11  3:40 PM      Component Value Range Status Comment   Specimen Description PERITONEAL   Final    Special Requests Normal   Final    Gram Stain     Final    Value: NO WBC SEEN     NO ORGANISMS SEEN   Culture NO GROWTH 3 DAYS   Final    Report Status 11/25/2011 FINAL   Final     Studies/Results: Ct Abdomen Pelvis Wo Contrast  11/19/2011  *RADIOLOGY REPORT*  Clinical Data: 66 year old female with abdominal pelvic pain, nausea and vomiting.  History of hysterectomy  CT ABDOMEN AND PELVIS WITHOUT CONTRAST  Technique:  Multidetector CT imaging of the abdomen and pelvis was performed following the standard protocol without intravenous contrast.  Comparison: None  Findings: There is a small-moderate amount of ascites within the abdomen and pelvis. There is wall thickening of the majority of the stomach - nonspecific but neoplasm is not excluded. There is omental stranding identified and metastatic disease is not excluded.  The liver, spleen, adrenal glands, kidneys, and pancreas are unremarkable.  Gallstones are noted filling the gallbladder and a 3 mm stone is noted within the cystic duct.  There is no CT evidence of acute cholecystitis.  Please note that parenchymal abnormalities may be missed as intravenous contrast was not administered.  Sigmoid colonic diverticulosis noted without diverticulitis. The bladder is within normal limits.  There is no evidence of biliary dilatation or abdominal aortic aneurysm. The patient is status post hysterectomy.  No acute or suspicious bony abnormalities are noted.  IMPRESSION: Apparent gastric wall thickening - neoplasm is not excluded and further evaluation is recommended.  Small to moderate ascites and omental stranding - nonspecific but metastatic disease is not excluded.  Cholelithiasis and 3 mm cystic duct stone - no CT evidence of acute cholecystitis.   Original Report Authenticated By: Lura Em, M.D.   Nm Pulmonary Perfusion  11/21/2011  *RADIOLOGY REPORT*  Clinical Data: Shortness of breath  NM PULMONARY PERFUSION PARTICULATE  Radiopharmaceutical: 3.4MILLI CURIE MAA TECHNETIUM TO 6M ALBUMIN AGGREGATED  Comparison: Chest x-ray from 11/21/2011  Findings: Multiplanar perfusion imaging shows no peripheral moderate or large perfusion defect in either  lung.  Elevation of the right hemidiaphragm accounts for decreased activity at the right base.  Given the normal bilateral perfusion, ventilation imaging was not performed.  IMPRESSION: Normal bilateral lung perfusion.  Original Report Authenticated By: ERIC A. MANSELL, M.D.   US Renal Port  11/20/2011  *RADIOLOGY REPORT*  Clinical Data: Acute renal failure.  History diabetes hypertension.  RENAL/URINARY TRACT ULTRASOUND COMPLETE  Comparison:  Unenhanced CT abdomen and pelvis yesterday.  Findings:  Right Kidney:  No hydronephrosis.  Well-preserved cortex.  No shadowing calculi.  Normal size and parenchymal echotexture without focal abnormalities.  Approximately 10.7 cm in length.  Left Kidney:  Suboptimally imaged due to body habitus. No hydronephrosis.  Well-preserved cortex.  No shadowing calculi. Normal size and parenchymal echotexture without focal abnormalities.  Approximately 11.0 cm in length.  Bladder:  Decompressed by Foley catheter.  IMPRESSION: Normal examination with a caveat that the left kidney was not optimally imaged due to body habitus; however, the left kidney was normal in appearance on the unenhanced CT yesterday.  Original Report Authenticated By: Deniece Portela, M.D.   US Paracentesis  11/21/2011  *RADIOLOGY REPORT*  Clinical Data: Abdominal pain, acute renal failure, gastric wall thickening, omental stranding, ascites; request is made for diagnostic and therapeutic  paracentesis up to 2 liters.  ULTRASOUND GUIDED DIAGNOSTIC AND THERAPEUTIC  PARACENTESIS  An ultrasound guided  paracentesis was thoroughly discussed with the patient and questions answered.  The benefits, risks, alternatives and complications were also discussed.  The patient understands and wishes to proceed with the procedure.  Written consent was obtained.  Ultrasound was performed to localize and mark an adequate pocket of fluid in the left mid to lower quadrant of the abdomen.  The area was then prepped and draped in the normal sterile fashion.  1% Lidocaine was used for local anesthesia.  Under ultrasound guidance a 19 gauge Yueh catheter was introduced.  Paracentesis was performed.  The catheter was removed and a dressing applied.  Complications:  none  Findings:  A total of approximately 1.5 liters of slightly turbid, yellow fluid was removed.  A fluid sample was  sent for laboratory analysis.  IMPRESSION: Successful ultrasound guided diagnostic and therapeutic paracentesis yielding 1.5 liters of ascites.  Read by: Rowe Robert, P.A.-C  Original Report Authenticated By: Trecia Rogers, M.D.   Dg Chest Port 1 View  11/23/2011  *RADIOLOGY REPORT*  Clinical Data: Atelectasis.  PORTABLE CHEST - 1 VIEW  Comparison: 11/22/2011.  Findings: Right IJ central line tip projects over the SVC.  Heart size stable.  Lungs are low in volume with mild diffuse bilateral air space disease, as before.  IMPRESSION: Low lung volumes mild diffuse bilateral air space disease, possibly due to edema.  Original Report Authenticated By: Luretha Rued, M.D.   Dg Chest Port 1 View  11/22/2011  *RADIOLOGY REPORT*  Clinical Data: Follow up of atelectasis.  Coronary artery disease. Gastroesophageal reflux disease.  Diabetes.  PORTABLE CHEST - 1 VIEW  Comparison: 11/21/2011  Findings: Minimal motion degraded exam.  Right IJ central line unchanged tip at low SVC. Numerous leads and wires project over the chest.  Normal heart size.  Moderate hemidiaphragm elevation. No pleural effusion or pneumothorax.  Low lung volumes.  Resultant  pulmonary interstitial prominence.  Patchy areas of lower lobe predominant atelectasis bilaterally.  The chin overlies the apices.  IMPRESSION: Low lung volumes with patchy areas of atelectasis.  Mildly motion degraded exam.  Original Report Authenticated By: Charlann Boxer.  Jobe Igo, M.D.   Dg Chest Port 1 View  11/21/2011  *RADIOLOGY REPORT*  Clinical Data: Right CVP line placement.  PORTABLE CHEST - 1 VIEW  Comparison: 11/20/2011  Findings: Interval placement of a right central venous catheter with tip in the low SVC region.  No pneumothorax.  Shallow inspiration.  Mild cardiac enlargement.  Pulmonary vascularity is normal for shallow inspiration.  Atelectasis in the lung bases.  IMPRESSION: Right central venous catheter with tip over the low SVC region.  No pneumothorax.  Otherwise stable appearance of the chest.  Original Report Authenticated By: Neale Burly, M.D.   Dg Chest Port 1 View  11/20/2011  *RADIOLOGY REPORT*  Clinical Data: Syncope  PORTABLE CHEST - 1 VIEW  Comparison: 03/17/2011  Findings: Low volumes.  Moderate cardiomegaly.  Bibasilar atelectasis.  No pneumothorax. No edema.  IMPRESSION: Bibasilar atelectasis.  Original Report Authenticated By: Jamas Lav, M.D.   Dg Abd Acute W/chest  11/19/2011  *RADIOLOGY REPORT*  Clinical Data: Abdominal pain.  ACUTE ABDOMEN SERIES (ABDOMEN 2 VIEW & CHEST 1 VIEW)  Comparison: 03/17/2011  Findings: Chest radiograph demonstrates clear lungs.  Heart size is upper limits normal but unchanged.  Trachea is midline.  No evidence for free air.  Nonobstructive bowel gas pattern. Gas- filled loop of bowel in the left lower abdomen may be related to colon.  Degenerative changes in the lumbar spine.  There is joint space narrowing in both hips.  IMPRESSION: No acute chest findings.  Nonspecific abdominal bowel gas pattern.  There is one large gas filled bowel loop in the left lower abdomen.  Original Report Authenticated By: Markus Daft, M.D.    Medications: Scheduled Meds:    . antiseptic oral rinse  15 mL Mouth Rinse BID  . cefTRIAXone (ROCEPHIN)  IV  1 g Intravenous Q24H  . furosemide  40 mg Intravenous BID  . insulin aspart  0-15 Units Subcutaneous TID AC & HS  . levalbuterol  0.63 mg Nebulization BID  . levothyroxine  50 mcg Intravenous Q breakfast  . metronidazole  500 mg Intravenous Q8H  . ondansetron  4 mg Intravenous Once  . pantoprazole  40 mg Oral BID AC  . sodium chloride  10-40 mL Intracatheter Q12H  . sodium chloride  3 mL Intravenous Q12H  . sucralfate  1 g Oral TID WC & HS  . DISCONTD: pantoprazole  40 mg Oral Q1200   Continuous Infusions:    . sodium chloride 20 mL/hr (11/28/11 0516)   PRN Meds:.ALPRAZolam, alum & mag hydroxide-simeth, ondansetron (ZOFRAN) IV, oxyCODONE-acetaminophen, sodium chloride  ASSESSMENT & PLAN: Principal Problem:  *Septic shock Active Problems:  Obesity (BMI 30.0-34.9)  Abdominal pain  Hyponatremia  Anemia  Acute kidney injury  Moderate protein-calorie malnutrition  Diabetes mellitus  HTN (hypertension)  Hypothyroidism  Dyslipidemia  Syncope and collapse  SBP (spontaneous bacterial peritonitis)  UTI (lower urinary tract infection)  I assumed the care on 11/26/2011, patient is being under [facility] critical [facility] service  Sepsis -Possible sepsis of abdominal origin, differential diagnoses include SBP, cancer and UTI. -For sure patient has a UTI, urine culture grew Klebsiella and Citrobacter, blood culture is no growth to date. -Patient on stress dose of steroids she was weaned off of that. -She is on Rocephin and Flagyl.  Acute renal failure -Likely secondary to hypotension and the settings of sepsis. -Patient creatinine is improving, today creatinine is 0.8 -Nephrology signed off, metformin and antihypertensives on hold. -I will add when necessary Lasix as patient has  lower extremity edema.  Gastric ulcer -Was seen previously on CT scan with  gastric wall thickening. -EGD was done by Dr. Benson Norway and showed gastric ulcer, biopsy showed benign gastric ulceration.  UTI -As mentioned above urine grew Klebsiella and Citrobacter. -She is on Rocephin and Flagyl. For the UTI she can be switched to ciprofloxacin. -Keep Rocephin for now as she is to follow the leukocytosis, followup on the CBC.  Ascites/SBP -Status post paracentesis and removal of 1.5 L of fluid, the cytology showed inflammatory cells. -Albumin was not obtained from the fluid sample -Cell differential showed total WBC of 9446, and 85% of it was neutrophils, that gives [place] ANC of 8029 cells/Cmm. -These findings along with fever, altered mental status suggest SBP. Anyway patient is on Rocephin and Flagyl.  Diabetes mellitus type 2 -Continue checking CBGs, SSI and carbohydrate modified diet.  Disposition -Remains as an inpatient. -Followup on her CBC, she's been on Rocephin and Flagyl for 10 days.    LOS: 10 days   Woodroe Vogan A 11/29/2011, 2:57 PM

## 2011-11-29 NOTE — Progress Notes (Signed)
Spoke with IV team regarding need for PICC if unable to initiate peripheral line so central line can be removed.  Phoned Dr. Zella Richer to see if midline catheter is sufficient.  Order received and relayed information to the IV nurse

## 2011-11-29 NOTE — Progress Notes (Signed)
Notified IV team to d/c central line and reinsert a peripheral line per MD request

## 2011-11-29 NOTE — Plan of Care (Signed)
Problem: Food- and Nutrition-Related Knowledge Deficit (NB-1.1) Goal: Nutrition education Formal process to instruct or train a patient/client in a skill or to impart knowledge to help patients/clients voluntarily manage or modify food choices and eating behavior to maintain or improve health.  Outcome: Completed/Met Date Met:  11/29/11 Met with pt to discussed sources of carbohydrate in diet and recommended portion sizes. Provided handout of this information. Pt expressed understanding. Recommend outpatient DM teaching for further reinforcement.

## 2011-11-29 NOTE — Progress Notes (Signed)
4 Days Post-Op  Subjective: No pain today, hasn't been up walking since admission.  Up in chair again today.  On O2.  Objective: Vital signs in last 24 hours: Temp:  [98.4 F (36.9 C)-98.9 F (37.2 C)] 98.4 F (36.9 C) (07/02 0523) Pulse Rate:  [102-110] 102  (07/02 0523) Resp:  [19-20] 19  (07/02 0523) BP: (143-155)/(73-74) 144/73 mmHg (07/02 0523) SpO2:  [95 %-100 %] 95 % (07/02 0844) Weight:  [124.1 kg (273 lb 9.5 oz)] 124.1 kg (273 lb 9.5 oz) (07/02 0523) Last BM Date: 11/28/11  + stools x 4 yesterday, afebrile, VSS, NA is down more, WBC is up  Intake/Output from previous day: 07/01 0701 - 07/02 0700 In: 1440 [P.O.:480; IV Piggyback:960] Out: 125 [Urine:125] Intake/Output this shift:    General appearance: alert, cooperative and no distress GI: soft, non-tender; bowel sounds normal; no masses,  no organomegaly Extremities: marked LE edema.  Lab Results:   Basename 11/29/11 0510 11/28/11 0430  WBC 18.8* 15.9*  HGB 9.0* 9.2*  HCT 28.6* 28.1*  PLT 499* 483*    BMET  Basename 11/29/11 0510 11/28/11 0430  NA 130* 133*  K 4.0 4.3  CL 98 101  CO2 26 25  GLUCOSE 139* 159*  BUN 8 9  CREATININE 0.86 0.84  CALCIUM 8.0* 8.3*   PT/INR No results found for this basename: LABPROT:2,INR:2 in the last 72 hours  No results found for this basename: AST:5,ALT:5,ALKPHOS:5,BILITOT:5,PROT:5,ALBUMIN:5 in the last 168 hours   Lipase     Component Value Date/Time   LIPASE 18 11/19/2011 1155     Studies/Results: Dg Chest 2 View  11/27/2011  *RADIOLOGY REPORT*  Clinical Data: Shortness of breath  CHEST - 2 VIEW  Comparison: 11/23/2011  Findings: Right IJ central line stable.  Persistent low lung volumes.  Some improvement in the pulmonary vascular congestion. There is some patchy linear subsegmental atelectasis or residual infiltrates in both lung bases.  Mild gaseous distention of the stomach.  No effusion.  Heart size normal.  IMPRESSION:  1.  Improving pulmonary vascular  congestion. 2.  Residual bibasilar atelectasis.  Original Report Authenticated By: Trecia Rogers, M.D.    Medications:    . antiseptic oral rinse  15 mL Mouth Rinse BID  . cefTRIAXone (ROCEPHIN)  IV  1 g Intravenous Q24H  . furosemide  40 mg Intravenous BID  . insulin aspart  0-15 Units Subcutaneous TID AC & HS  . levalbuterol  0.63 mg Nebulization BID  . levothyroxine  50 mcg Intravenous Q breakfast  . metronidazole  500 mg Intravenous Q8H  . ondansetron  4 mg Intravenous Once  . pantoprazole  40 mg Oral Q1200  . sodium chloride  10-40 mL Intracatheter Q12H  . sodium chloride  3 mL Intravenous Q12H  . DISCONTD: levalbuterol  0.63 mg Nebulization Q6H    Assessment/Plan Large gastric ulcer-biopsy results pending Abdominal pain with thickened gastric wall and omental stranding. Concern for gastric CA Endoscopy results showed retained stomach contents, about 1 liter, and large irregular ulcer proximal gastric body along the greater curvature going into the fundus.  Peritoneal fluid showed ascites, no malignancy. Hypotension, still on Neo at 153mcg/min.  Hyponatremia  AODM  PCM  Obesity(BMI 30.0-34.9)  Anemia  UTI (lower urinary tract infection)   Plan:  Still waiting on Path.  LOS: 10 days    Trek Kimball 11/29/2011

## 2011-11-30 DIAGNOSIS — R739 Hyperglycemia, unspecified: Secondary | ICD-10-CM | POA: Diagnosis present

## 2011-11-30 LAB — CBC
HCT: 26.4 % — ABNORMAL LOW (ref 36.0–46.0)
Hemoglobin: 8.4 g/dL — ABNORMAL LOW (ref 12.0–15.0)
MCH: 26.5 pg (ref 26.0–34.0)
MCHC: 31.8 g/dL (ref 30.0–36.0)
MCV: 83.3 fL (ref 78.0–100.0)
Platelets: 437 10*3/uL — ABNORMAL HIGH (ref 150–400)
RBC: 3.17 MIL/uL — ABNORMAL LOW (ref 3.87–5.11)
RDW: 17.9 % — ABNORMAL HIGH (ref 11.5–15.5)
WBC: 14 10*3/uL — ABNORMAL HIGH (ref 4.0–10.5)

## 2011-11-30 LAB — GLUCOSE, CAPILLARY
Glucose-Capillary: 102 mg/dL — ABNORMAL HIGH (ref 70–99)
Glucose-Capillary: 123 mg/dL — ABNORMAL HIGH (ref 70–99)

## 2011-11-30 LAB — BASIC METABOLIC PANEL
BUN: 6 mg/dL (ref 6–23)
CO2: 27 mEq/L (ref 19–32)
Calcium: 8 mg/dL — ABNORMAL LOW (ref 8.4–10.5)
Chloride: 97 mEq/L (ref 96–112)
Creatinine, Ser: 0.88 mg/dL (ref 0.50–1.10)
GFR calc Af Amer: 77 mL/min — ABNORMAL LOW (ref >90)
GFR calc non Af Amer: 66 mL/min — ABNORMAL LOW (ref >90)
Glucose, Bld: 113 mg/dL — ABNORMAL HIGH (ref 70–99)
Potassium: 3.7 mEq/L (ref 3.5–5.1)
Sodium: 130 mEq/L — ABNORMAL LOW (ref 135–145)

## 2011-11-30 MED ORDER — METRONIDAZOLE 500 MG PO TABS: 500.0000 mg | ORAL_TABLET | Freq: Three times a day (TID) | ORAL | Status: AC

## 2011-11-30 MED ORDER — SUCRALFATE 1 G PO TABS: 1.0000 g | ORAL_TABLET | Freq: Three times a day (TID) | ORAL | Status: DC

## 2011-11-30 MED ORDER — CIPROFLOXACIN HCL 500 MG PO TABS: 500.0000 mg | ORAL_TABLET | Freq: Two times a day (BID) | ORAL | Status: AC

## 2011-11-30 MED ORDER — PANTOPRAZOLE SODIUM 40 MG PO TBEC: 40.0000 mg | DELAYED_RELEASE_TABLET | Freq: Two times a day (BID) | ORAL | Status: DC

## 2011-11-30 MED ORDER — METOPROLOL SUCCINATE ER 25 MG PO TB24: 25.0000 mg | ORAL_TABLET | Freq: Every day | ORAL | Status: DC

## 2011-11-30 MED ORDER — POTASSIUM CHLORIDE ER 10 MEQ PO TBCR: 10.0000 meq | EXTENDED_RELEASE_TABLET | Freq: Every day | ORAL | Status: DC

## 2011-11-30 MED ORDER — PROBIOTIC PRODUCT PO CAPS: 1.0000 | ORAL_CAPSULE | Freq: Three times a day (TID) | ORAL | Status: DC

## 2011-11-30 NOTE — Progress Notes (Signed)
5 Days Post-Op  Subjective: UP in chair again, says she's suppose to go home today.  Objective: Vital signs in last 24 hours: Temp:  [98.3 F (36.8 C)-98.8 F (37.1 C)] 98.3 F (36.8 C) (07/03 0600) Pulse Rate:  [106-111] 106  (07/03 0600) Resp:  [20] 20  (07/03 0600) BP: (119-150)/(69-94) 124/69 mmHg (07/03 0600) SpO2:  [96 %-100 %] 98 % (07/03 0858) Weight:  [124 kg (273 lb 5.9 oz)] 124 kg (273 lb 5.9 oz) (07/03 0600) Last BM Date: 11/30/11  Intake/Output from previous day: 07/02 0701 - 07/03 0700 In: 480 [P.O.:480] Out: 2500 [Urine:2500] Intake/Output this shift: Total I/O In: -  Out: 350 [Urine:350]  General appearance: alert, cooperative and no distress GI: soft, non-tender; bowel sounds normal; no masses,  no organomegaly  Lab Results:   [facility] 11/30/11 0457 11/29/11 0510  WBC 14.0* 18.8*  HGB 8.4* 9.0*  HCT 26.4* 28.6*  PLT 437* 499*    BMET  Basename 11/30/11 0457 11/29/11 0510  NA 130* 130*  K 3.7 4.0  CL 97 98  CO2 27 26  GLUCOSE 113* 139*  BUN 6 8  CREATININE 0.88 0.86  CALCIUM 8.0* 8.0*   PT/INR No results found for this basename: LABPROT:2,INR:2 in the last 72 hours  No results found for this basename: AST:5,ALT:5,ALKPHOS:5,BILITOT:5,PROT:5,ALBUMIN:5 in the last 168 hours   Lipase     Component Value Date/Time   LIPASE 18 11/19/2011 1155     Studies/Results: No results found.  Medications:    . antiseptic oral rinse  15 mL Mouth Rinse BID  . cefTRIAXone (ROCEPHIN)  IV  1 g Intravenous Q24H  . feeding supplement  237 mL Oral BID BM  . furosemide  40 mg Intravenous BID  . insulin aspart  0-15 Units Subcutaneous TID AC & HS  . levalbuterol  0.63 mg Nebulization BID  . levothyroxine  50 mcg Intravenous Q breakfast  . metronidazole  500 mg Intravenous Q8H  . ondansetron  4 mg Intravenous Once  . pantoprazole  40 mg Oral BID AC  . sodium chloride  10-40 mL Intracatheter Q12H  . sodium chloride  3 mL Intravenous Q12H  . sucralfate   1 g Oral TID WC & HS  . DISCONTD: pantoprazole  40 mg Oral Q1200    Assessment/Plan Large gastric ulcer-biopsy results pending Abdominal pain with thickened gastric wall and omental stranding. Concern for gastric CA . Path back yesterday and shows no malignancy. Endoscopy results showed retained stomach contents, about 1 liter, and large irregular ulcer proximal gastric body along the greater curvature going into the fundus.  Peritoneal fluid showed ascites, no malignancy.  Hypotension, still on Neo at 164mcg/min.  Hyponatremia  AODM  PCM  Obesity(BMI 30.0-34.9)  Anemia  UTI (lower urinary tract infection)   Plan:  Medical management, PPI, Carafate, repeat EGD 6 weeks with DR. Hung, Call if we can help, she has no need for surgery at this point.  LOS: 11 days    Amanda Cain 11/30/2011

## 2011-11-30 NOTE — Discharge Summary (Signed)
Physician Discharge Summary  Patient ID: Amanda Cain MRN: [id number] DOB/AGE: June 17, 1945 66 y.o.  Admit date: 11/19/2011 Discharge date: 11/30/2011  Discharge Diagnoses:  Principal Problem:  *Septic shock Active Problems:  Obesity (BMI 30.0-34.9)  Abdominal pain  Hyponatremia  Anemia  Acute kidney injury  Moderate protein-calorie malnutrition  Diabetes mellitus  HTN (hypertension)  Hypothyroidism  Dyslipidemia  Syncope and collapse  SBP (spontaneous bacterial peritonitis)  UTI (lower urinary tract infection)  Hyperglycemia   For OUTPATIENT FOLLOW UP WITH PCP:  Please recheck BP and discuss BP meds.  We had held her BP meds because of hypotension and some restarted at lower doses, but she will need to have them titrated outpatient follow up.  Please recheck CBC and BMP on outpatient follow up appointment.   Discharged Condition: good  Please see hospital notes, records, labs for full details of this long, complicated hospitalization.  Hospital Course: HPI: 66 y/o AAF with DM, CAD, HTN, obesity, hypothyroidism and GERD. Admitted 6/22 for evaluation and management of lower abdominal pain and vomiting for 3 to 4 days. CT scan of the abdomen showed gastric wall thickening, small to moderate ascites and omental stranding, cholelithiasis and 3 mm cystic duct stone, without evidence of acute cholecystitis. At admission she was also found to have a creatinine of 2.59. General surgery, GI and nephrology consults were called. Afternoon of 6/23 the patient experienced a syncopal episode And then remained hypotensive and oliguric. Critical care consult was called (6/23) to help in the evaluation and management.  Sepsis  -sepsis of possible abdominal origin (SBP), initially treated in ICU, treated with 10 days of rocephin/flagyl IV -patient has a UTI, urine culture grew Klebsiella and Citrobacter sensitive to ciprofloxacin (pt will be discharged on) -Patient completed stress dose of  steroids and she was weaned off.  -She is going home on cipro/flagy for an additional 5 days. WBC trending down.  Recheck 1 week with PCP at outpatient follow up.    Acute renal failure - resolved now -Likely secondary to hypotension and the settings of sepsis.  -Patient creatinine is improving, today creatinine is 0.8  -Nephrology signed off, metformin and antihypertensives on hold.  -Lasix treated in hospital and will resume on discharge 40 mg per day for lower extremity edema/ volume overload   Gastric ulcer  -Was seen previously on CT scan with gastric wall thickening.  -EGD was done by Dr. Benson Norway and showed gastric ulcer, biopsy showed benign gastric ulceration.  - home on protonix 40 mg BID plus sucralfate, follow up with GI in 1 month  UTI  -As mentioned above urine grew Klebsiella and Citrobacter.  -She was on Rocephin and Flagyl. For outpatient treatment sent home on iprofloxacin.    Ascites/SBP  -Status post paracentesis and removal of 1.5 L of fluid, the cytology showed inflammatory cells.  -Albumin was not obtained from the fluid sample  -Cell differential showed total WBC of 9446, and 85% of it was neutrophils, that gives [place] ANC of 8029 cells/Cmm.  -These findings along with fever, altered mental status suggest SBP.  Patient treated with Rocephin and Flagyl IV x 10 days and home on cipro/flagyl for additional 5 days -Pt to follow up with GI Dr. Benson Norway as above.   Diabetes mellitus type 2  -Continue checking CBGs, and carbohydrate modified diet. -resuming metformin at discharge -follow up with PCP  Consults: GI, Critical Care, Surgery (CCS)   Discharge Exam: Pt sitting up in chair, says she feels much better, breathing better,  in no distress, asking to go home, no complaints.  Says she will follow up outpatient as recommended, off oxygen Blood pressure 124/69, pulse 106, temperature 98.3 F (36.8 C), temperature source Oral, resp. rate 20, height 4\' 11"  (1.499 m), weight  124 kg (273 lb 5.9 oz), SpO2 98.00%. General: Alert and awake oriented x3 not in any acute distress.  HEENT: anicteric sclera, pupils equal reactive to light and accommodation  CVS: S1-S2 heard, no murmur rubs or gallops  Chest: clear to auscultation bilaterally, no wheezing rales or rhonchi  Abdomen: normal bowel sounds, soft, nontender, nondistended, no organomegaly  Neuro: Cranial nerves II-XII intact, no focal neurological deficits  Extremities: no cyanosis, no clubbing, +1 pedal edema noted bilaterally   Disposition: Home with Home Health as arranged  Discharge Orders    Future Orders Please Complete By Expires   Increase activity slowly      Discharge instructions      Comments:   Please check BS 3 times per day and record readings and report to primary care physician Check blood pressure 2 times per day and report readings to primary care physician We have held some of your old blood pressure medications. Please check with your primary care physician about restarting your old blood pressure medications.   Please follow up with your primary care physician in 1 week and follow up with your gastroenterologist Dr. Benson Norway in 1 month for recheck.  Please have your CBC and BMP rechecked with your primary care physician when you follow up in 1 week.   Return if symptoms recur, worsen or new problems develop.     Medication List  As of 11/30/2011  9:18 AM   STOP taking these medications         olmesartan-hydrochlorothiazide 40-25 MG per tablet      omeprazole 20 MG capsule         TAKE these medications         atorvastatin 40 MG tablet   Commonly known as: LIPITOR   Take 40 mg by mouth every evening.      Calcium Carbonate-Vitamin D 600-400 MG-UNIT per tablet   Take 1 tablet by mouth 2 (two) times daily.      ciprofloxacin 500 MG tablet   Commonly known as: CIPRO   Take 1 tablet (500 mg total) by mouth 2 (two) times daily.      escitalopram 20 MG tablet   Commonly known as:  LEXAPRO   Take 20 mg by mouth at bedtime.      estrogens (conjugated) 0.45 MG tablet   Commonly known as: PREMARIN   Take 0.45 mg by mouth daily.      felodipine 5 MG 24 hr tablet   Commonly known as: PLENDIL   Take 5 mg by mouth every morning.      ferrous sulfate 325 (65 FE) MG tablet   Take 325 mg by mouth daily with breakfast.      furosemide 40 MG tablet   Commonly known as: LASIX   Take 40 mg by mouth daily.      INVEGA SUSTENNA 117 MG/0.75ML Susp   Generic drug: Paliperidone Palmitate   Inject 117 mg into the muscle every 30 (thirty) days.      levothyroxine 100 MCG tablet   Commonly known as: SYNTHROID, LEVOTHROID   Take 100 mcg by mouth daily before breakfast.      LORazepam 0.5 MG tablet   Commonly known as: ATIVAN   Take 0.5  mg by mouth 2 (two) times daily.      metFORMIN 1000 MG tablet   Commonly known as: GLUCOPHAGE   Take 1,000 mg by mouth 2 (two) times daily with a meal.      metoprolol succinate 25 MG 24 hr tablet   Commonly known as: TOPROL-XL   Take 1 tablet (25 mg total) by mouth daily with breakfast.      metroNIDAZOLE 500 MG tablet   Commonly known as: FLAGYL   Take 1 tablet (500 mg total) by mouth 3 (three) times daily.      Misc Intestinal Flora Regulat Caps   Take 1 capsule by mouth 3 (three) times daily with meals.      multivitamin with minerals Tabs   Take 1 tablet by mouth daily.      pantoprazole 40 MG tablet   Commonly known as: PROTONIX   Take 1 tablet (40 mg total) by mouth 2 (two) times daily before a meal.      potassium chloride 10 MEQ tablet   Commonly known as: K-DUR   Take 1 tablet (10 mEq total) by mouth daily.      sucralfate 1 G tablet   Commonly known as: CARAFATE   Take 1 tablet (1 g total) by mouth 4 (four) times daily -  with meals and at bedtime.      vitamin B-12 1000 MCG tablet   Commonly known as: CYANOCOBALAMIN   Take 1,000 mcg by mouth daily.      vitamin C 500 MG tablet   Commonly known as: ASCORBIC  ACID   Take 500 mg by mouth daily.      vitamin E 400 UNIT capsule   Generic drug: vitamin E   Take 400 Units by mouth daily.           Follow-up Information    Follow up with SHAW,KIMBERLEE, MD. Schedule an appointment as soon as possible for a visit in 1 week. (Hosp F/U Recheck CBC, BMP, BP and BS)    Contact information:   [address] [place] [phone]       Follow up with Beryle Beams, MD. Schedule an appointment as soon as possible for a visit in 1 month. [facility] F/U Ulcer)    Contact information:   [address], [place] [phone]         I spent 65 mins preparing discharge, reviewing records, notes, labs, arranging follow up, dictating summary, etc.   Signed: Shayan Bramhall  11/30/2011, 9:18 AM Pager  [phone]

## 2011-11-30 NOTE — Progress Notes (Signed)
Patient seen and examined.  Agree with PA's note.  

## 2011-12-06 ENCOUNTER — Ambulatory Visit: Payer: Medicare Other

## 2011-12-06 ENCOUNTER — Ambulatory Visit: Payer: Medicare Other | Admitting: Hematology and Oncology

## 2011-12-13 ENCOUNTER — Telehealth: Payer: Self-pay | Admitting: Hematology and Oncology

## 2011-12-13 NOTE — Telephone Encounter (Signed)
Pt r/s one new pt appt w/LO and was a now show for the other. Called pt today re r/s but was not able to reach her or lm.

## 2012-01-02 ENCOUNTER — Other Ambulatory Visit: Payer: Self-pay | Admitting: Gastroenterology

## 2012-01-02 DIAGNOSIS — R188 Other ascites: Secondary | ICD-10-CM

## 2012-01-19 ENCOUNTER — Ambulatory Visit
Admission: RE | Admit: 2012-01-19 | Discharge: 2012-01-19 | Disposition: A | Discharge status: Home / Self Care | Payer: Medicare Other | Source: Ambulatory Visit | Attending: Gastroenterology | Admitting: Gastroenterology

## 2012-01-19 DIAGNOSIS — R188 Other ascites: Secondary | ICD-10-CM

## 2012-04-23 ENCOUNTER — Other Ambulatory Visit: Payer: Self-pay | Admitting: Family Medicine

## 2012-04-23 DIAGNOSIS — Z1231 Encounter for screening mammogram for malignant neoplasm of breast: Secondary | ICD-10-CM

## 2012-06-01 ENCOUNTER — Ambulatory Visit: Payer: Medicare Other

## 2012-07-12 ENCOUNTER — Ambulatory Visit: Payer: Medicare Other

## 2012-08-13 ENCOUNTER — Ambulatory Visit: Payer: Medicare Other

## 2012-09-25 ENCOUNTER — Ambulatory Visit: Payer: Medicare Other

## 2012-10-17 ENCOUNTER — Other Ambulatory Visit: Payer: Self-pay | Admitting: Orthopedic Surgery

## 2012-10-18 ENCOUNTER — Encounter (HOSPITAL_COMMUNITY): Payer: Self-pay | Admitting: Pharmacy Technician

## 2012-10-25 ENCOUNTER — Other Ambulatory Visit (HOSPITAL_COMMUNITY): Payer: Self-pay | Admitting: Orthopedic Surgery

## 2012-10-25 NOTE — Patient Instructions (Addendum)
20 Amanda Cain  10/25/2012   Your procedure is scheduled on: 10-31-2012  Report to [facility] at 630 AM.  Call this number if you have problems the morning of surgery [phone]   Remember:   Do not eat food or drink liquids :After Midnight.    Take these medicines the morning of surgery with A SIP OF WATER: levothyroxine, ativan, metoprolol, pantoprazole                                SEE [facility] PREPARING FOR SURGERY SHEET   Do not wear jewelry, make-up or nail polish.  Do not wear lotions, powders, or perfumes. You may wear deodorant.   Men may shave face and neck.  Do not bring valuables to the hospital.  Contacts, dentures or bridgework may not be worn into surgery.  Leave suitcase in the car. After surgery it may be brought to your room.  For patients admitted to the hospital, checkout time is 11:00 AM the day of discharge.    Please read over the following fact sheets that you were given: MRSA Information, blood fact sheet, incentive spirometer fact sheet  Call Paulette Blanch RN pre op nurse if needed [phone]    [place]. PATIENT SIGNATURE___________________________________________

## 2012-10-26 ENCOUNTER — Ambulatory Visit (HOSPITAL_COMMUNITY)
Admission: RE | Admit: 2012-10-26 | Discharge: 2012-10-26 | Disposition: A | Discharge status: Home / Self Care | Payer: Medicare Other | Source: Ambulatory Visit | Attending: Orthopedic Surgery | Admitting: Orthopedic Surgery

## 2012-10-26 ENCOUNTER — Encounter (HOSPITAL_COMMUNITY): Payer: Self-pay

## 2012-10-26 ENCOUNTER — Encounter (HOSPITAL_COMMUNITY)
Admission: RE | Admit: 2012-10-26 | Discharge: 2012-10-26 | Disposition: A | Discharge status: Home / Self Care | Payer: Medicare Other | Source: Ambulatory Visit | Attending: Orthopedic Surgery | Admitting: Orthopedic Surgery

## 2012-10-26 DIAGNOSIS — Z01812 Encounter for preprocedural laboratory examination: Secondary | ICD-10-CM | POA: Insufficient documentation

## 2012-10-26 DIAGNOSIS — E119 Type 2 diabetes mellitus without complications: Secondary | ICD-10-CM | POA: Insufficient documentation

## 2012-10-26 DIAGNOSIS — I10 Essential (primary) hypertension: Secondary | ICD-10-CM | POA: Insufficient documentation

## 2012-10-26 DIAGNOSIS — Z01818 Encounter for other preprocedural examination: Secondary | ICD-10-CM | POA: Insufficient documentation

## 2012-10-26 DIAGNOSIS — Z01810 Encounter for preprocedural cardiovascular examination: Secondary | ICD-10-CM | POA: Insufficient documentation

## 2012-10-26 HISTORY — DX: Unspecified osteoarthritis, unspecified site: M19.90

## 2012-10-26 HISTORY — DX: Bronchitis, not specified as acute or chronic: J40

## 2012-10-26 HISTORY — DX: Pure hypercholesterolemia, unspecified: E78.00

## 2012-10-26 HISTORY — DX: Chronic kidney disease, unspecified: N18.9

## 2012-10-26 HISTORY — DX: Personal history of other diseases of the digestive system: Z87.19

## 2012-10-26 LAB — URINALYSIS, ROUTINE W REFLEX MICROSCOPIC
Bilirubin Urine: NEGATIVE
Glucose, UA: NEGATIVE mg/dL
Hgb urine dipstick: NEGATIVE
Ketones, ur: NEGATIVE mg/dL
Leukocytes, UA: NEGATIVE
Nitrite: NEGATIVE
Protein, ur: NEGATIVE mg/dL
Specific Gravity, Urine: 1.012 (ref 1.005–1.030)
Urobilinogen, UA: 0.2 mg/dL (ref 0.0–1.0)
pH: 5.5 (ref 5.0–8.0)

## 2012-10-26 LAB — COMPREHENSIVE METABOLIC PANEL
ALT: 14 U/L (ref 0–35)
AST: 15 U/L (ref 0–37)
Albumin: 3.8 g/dL (ref 3.5–5.2)
Alkaline Phosphatase: 62 U/L (ref 39–117)
BUN: 15 mg/dL (ref 6–23)
CO2: 27 mEq/L (ref 19–32)
Calcium: 9.3 mg/dL (ref 8.4–10.5)
Chloride: 101 mEq/L (ref 96–112)
Creatinine, Ser: 1.2 mg/dL — ABNORMAL HIGH (ref 0.50–1.10)
GFR calc Af Amer: 53 mL/min — ABNORMAL LOW (ref >90)
GFR calc non Af Amer: 45 mL/min — ABNORMAL LOW (ref >90)
Glucose, Bld: 92 mg/dL (ref 70–99)
Potassium: 3.6 mEq/L (ref 3.5–5.1)
Sodium: 140 mEq/L (ref 135–145)
Total Bilirubin: 0.4 mg/dL (ref 0.3–1.2)
Total Protein: 7.4 g/dL (ref 6.0–8.3)

## 2012-10-26 LAB — CBC WITH DIFFERENTIAL/PLATELET
Basophils Absolute: 0 10*3/uL (ref 0.0–0.1)
Basophils Relative: 0 % (ref 0–1)
Eosinophils Absolute: 0.1 10*3/uL (ref 0.0–0.7)
Eosinophils Relative: 2 % (ref 0–5)
HCT: 32.6 % — ABNORMAL LOW (ref 36.0–46.0)
Hemoglobin: 10.3 g/dL — ABNORMAL LOW (ref 12.0–15.0)
Lymphocytes Relative: 17 % (ref 12–46)
Lymphs Abs: 1 10*3/uL (ref 0.7–4.0)
MCH: 27 pg (ref 26.0–34.0)
MCHC: 31.6 g/dL (ref 30.0–36.0)
MCV: 85.6 fL (ref 78.0–100.0)
Monocytes Absolute: 0.5 10*3/uL (ref 0.1–1.0)
Monocytes Relative: 9 % (ref 3–12)
Neutro Abs: 4.5 10*3/uL (ref 1.7–7.7)
Neutrophils Relative %: 72 % (ref 43–77)
Platelets: 370 10*3/uL (ref 150–400)
RBC: 3.81 MIL/uL — ABNORMAL LOW (ref 3.87–5.11)
RDW: 15.2 % (ref 11.5–15.5)
WBC: 6.1 10*3/uL (ref 4.0–10.5)

## 2012-10-26 LAB — SURGICAL PCR SCREEN
MRSA, PCR: NEGATIVE
Staphylococcus aureus: NEGATIVE

## 2012-10-26 LAB — PROTIME-INR
INR: 1.02 (ref 0.00–1.49)
Prothrombin Time: 13.3 seconds (ref 11.6–15.2)

## 2012-10-26 LAB — APTT: aPTT: 38 seconds — ABNORMAL HIGH (ref 24–37)

## 2012-10-26 NOTE — Progress Notes (Signed)
10/26/12 0847  OBSTRUCTIVE SLEEP APNEA  Have you ever been diagnosed with sleep apnea through a sleep study? No  Do you snore loudly (loud enough to be heard through closed doors)?  1  Do you often feel tired, fatigued, or sleepy during the daytime? 0  Has anyone observed you stop breathing during your sleep? 0  Do you have, or are you being treated for high blood pressure? 1  BMI more than 35 kg/m2? 1  Age over 69 years old? 1  Neck circumference greater than 40 cm/18 inches? 0  Gender: 0  Obstructive Sleep Apnea Score 4  Score 4 or greater  Results sent to PCP

## 2012-10-31 ENCOUNTER — Encounter (HOSPITAL_COMMUNITY)
Admission: RE | Disposition: A | Discharge status: Skilled Nursing Facility | Payer: Self-pay | Source: Ambulatory Visit | Attending: Orthopedic Surgery

## 2012-10-31 ENCOUNTER — Encounter (HOSPITAL_COMMUNITY): Payer: Self-pay | Admitting: Anesthesiology

## 2012-10-31 ENCOUNTER — Encounter (HOSPITAL_COMMUNITY): Payer: Self-pay

## 2012-10-31 ENCOUNTER — Inpatient Hospital Stay (HOSPITAL_COMMUNITY)
Admission: RE | Admit: 2012-10-31 | Discharge: 2012-11-02 | DRG: 470 | Disposition: A | Discharge status: Skilled Nursing Facility | Payer: Medicare Other | Source: Ambulatory Visit | Attending: Orthopedic Surgery | Admitting: Orthopedic Surgery

## 2012-10-31 ENCOUNTER — Inpatient Hospital Stay (HOSPITAL_COMMUNITY): Payer: Medicare Other | Admitting: Anesthesiology

## 2012-10-31 DIAGNOSIS — K219 Gastro-esophageal reflux disease without esophagitis: Secondary | ICD-10-CM | POA: Diagnosis present

## 2012-10-31 DIAGNOSIS — M1710 Unilateral primary osteoarthritis, unspecified knee: Principal | ICD-10-CM | POA: Diagnosis present

## 2012-10-31 DIAGNOSIS — E119 Type 2 diabetes mellitus without complications: Secondary | ICD-10-CM | POA: Diagnosis present

## 2012-10-31 DIAGNOSIS — N183 Chronic kidney disease, stage 3 unspecified: Secondary | ICD-10-CM | POA: Diagnosis present

## 2012-10-31 DIAGNOSIS — I129 Hypertensive chronic kidney disease with stage 1 through stage 4 chronic kidney disease, or unspecified chronic kidney disease: Secondary | ICD-10-CM | POA: Diagnosis present

## 2012-10-31 DIAGNOSIS — F3289 Other specified depressive episodes: Secondary | ICD-10-CM | POA: Diagnosis present

## 2012-10-31 DIAGNOSIS — Z6841 Body Mass Index (BMI) 40.0 and over, adult: Secondary | ICD-10-CM

## 2012-10-31 DIAGNOSIS — D62 Acute posthemorrhagic anemia: Secondary | ICD-10-CM | POA: Diagnosis not present

## 2012-10-31 DIAGNOSIS — E871 Hypo-osmolality and hyponatremia: Secondary | ICD-10-CM | POA: Diagnosis present

## 2012-10-31 DIAGNOSIS — R109 Unspecified abdominal pain: Secondary | ICD-10-CM | POA: Diagnosis present

## 2012-10-31 DIAGNOSIS — F329 Major depressive disorder, single episode, unspecified: Secondary | ICD-10-CM | POA: Diagnosis present

## 2012-10-31 DIAGNOSIS — M1711 Unilateral primary osteoarthritis, right knee: Secondary | ICD-10-CM | POA: Diagnosis present

## 2012-10-31 DIAGNOSIS — E039 Hypothyroidism, unspecified: Secondary | ICD-10-CM | POA: Diagnosis present

## 2012-10-31 DIAGNOSIS — E7800 Pure hypercholesterolemia, unspecified: Secondary | ICD-10-CM | POA: Diagnosis present

## 2012-10-31 HISTORY — PX: TOTAL KNEE ARTHROPLASTY: SHX125

## 2012-10-31 LAB — TYPE AND SCREEN
ABO/RH(D): O POS
Antibody Screen: POSITIVE
DAT, IgG: NEGATIVE
PT AG Type: POSITIVE
Unit division: 0
Unit division: 0

## 2012-10-31 LAB — GLUCOSE, CAPILLARY
Glucose-Capillary: 85 mg/dL (ref 70–99)
Glucose-Capillary: 179 mg/dL — ABNORMAL HIGH (ref 70–99)
Glucose-Capillary: 167 mg/dL — ABNORMAL HIGH (ref 70–99)
Glucose-Capillary: 174 mg/dL — ABNORMAL HIGH (ref 70–99)

## 2012-10-31 SURGERY — ARTHROPLASTY, KNEE, TOTAL
Anesthesia: General | Site: Knee | Laterality: Right | Wound class: Clean

## 2012-10-31 MED ORDER — METHOCARBAMOL 500 MG PO TABS
500.0000 mg | ORAL_TABLET | Freq: Four times a day (QID) | ORAL | Status: DC | PRN
  Administered 2012-10-31 – 2012-11-02 (×3): 500 mg via ORAL
  Filled 2012-10-31 (×3): qty 1

## 2012-10-31 MED ORDER — ATORVASTATIN CALCIUM 40 MG PO TABS
40.0000 mg | ORAL_TABLET | Freq: Every day | ORAL | Status: DC
  Administered 2012-10-31 – 2012-11-01 (×2): 40 mg via ORAL
  Filled 2012-10-31 (×3): qty 1

## 2012-10-31 MED ORDER — LACTATED RINGERS IV SOLN: INTRAVENOUS | Status: DC

## 2012-10-31 MED ORDER — LACTATED RINGERS IV SOLN
INTRAVENOUS | Status: DC | PRN
  Administered 2012-10-31 (×2): via INTRAVENOUS

## 2012-10-31 MED ORDER — SUCCINYLCHOLINE CHLORIDE 20 MG/ML IJ SOLN
INTRAMUSCULAR | Status: DC | PRN
  Administered 2012-10-31: 100 mg via INTRAVENOUS

## 2012-10-31 MED ORDER — HYDROCHLOROTHIAZIDE 25 MG PO TABS
25.0000 mg | ORAL_TABLET | Freq: Every day | ORAL | Status: DC
  Administered 2012-10-31 – 2012-11-02 (×3): 25 mg via ORAL
  Filled 2012-10-31 (×3): qty 1

## 2012-10-31 MED ORDER — INSULIN ASPART 100 UNIT/ML ~~LOC~~ SOLN
0.0000 [IU] | Freq: Three times a day (TID) | SUBCUTANEOUS | Status: DC
  Administered 2012-10-31: 3 [IU] via SUBCUTANEOUS
  Administered 2012-11-01 – 2012-11-02 (×4): 2 [IU] via SUBCUTANEOUS

## 2012-10-31 MED ORDER — LEVOTHYROXINE SODIUM 125 MCG PO TABS
125.0000 ug | ORAL_TABLET | Freq: Every day | ORAL | Status: DC
  Filled 2012-10-31: qty 1

## 2012-10-31 MED ORDER — PANTOPRAZOLE SODIUM 40 MG PO TBEC
40.0000 mg | DELAYED_RELEASE_TABLET | Freq: Two times a day (BID) | ORAL | Status: DC
  Administered 2012-11-01 – 2012-11-02 (×3): 40 mg via ORAL
  Filled 2012-10-31 (×6): qty 1

## 2012-10-31 MED ORDER — FERROUS SULFATE 325 (65 FE) MG PO TABS
325.0000 mg | ORAL_TABLET | Freq: Two times a day (BID) | ORAL | Status: DC
Start: 2012-10-31 — End: 2012-11-02
  Administered 2012-10-31 – 2012-11-02 (×4): 325 mg via ORAL
  Filled 2012-10-31 (×6): qty 1

## 2012-10-31 MED ORDER — DIPHENHYDRAMINE HCL 12.5 MG/5ML PO ELIX: 12.5000 mg | ORAL_SOLUTION | ORAL | Status: DC | PRN

## 2012-10-31 MED ORDER — SODIUM CHLORIDE 0.9 % IV SOLN
INTRAVENOUS | Status: DC
  Administered 2012-10-31 – 2012-11-01 (×2): via INTRAVENOUS

## 2012-10-31 MED ORDER — METOPROLOL SUCCINATE ER 25 MG PO TB24
25.0000 mg | ORAL_TABLET | Freq: Every day | ORAL | Status: DC
  Administered 2012-11-01 – 2012-11-02 (×2): 25 mg via ORAL
  Filled 2012-10-31 (×3): qty 1

## 2012-10-31 MED ORDER — POVIDONE-IODINE 7.5 % EX SOLN
Freq: Once | CUTANEOUS | Status: AC
  Administered 2012-10-31: 08:00:00 via TOPICAL

## 2012-10-31 MED ORDER — FENTANYL CITRATE 0.05 MG/ML IJ SOLN
25.0000 ug | INTRAMUSCULAR | Status: DC | PRN
  Administered 2012-10-31: 50 ug via INTRAVENOUS

## 2012-10-31 MED ORDER — ESCITALOPRAM OXALATE 20 MG PO TABS
20.0000 mg | ORAL_TABLET | Freq: Every day | ORAL | Status: DC
  Administered 2012-10-31 – 2012-11-01 (×2): 20 mg via ORAL
  Filled 2012-10-31 (×3): qty 1

## 2012-10-31 MED ORDER — MEPERIDINE HCL 50 MG/ML IJ SOLN: 6.2500 mg | INTRAMUSCULAR | Status: DC | PRN

## 2012-10-31 MED ORDER — ROCURONIUM BROMIDE 100 MG/10ML IV SOLN
INTRAVENOUS | Status: DC | PRN
  Administered 2012-10-31: 20 mg via INTRAVENOUS

## 2012-10-31 MED ORDER — ROPIVACAINE HCL 5 MG/ML IJ SOLN
INTRAMUSCULAR | Status: DC | PRN
  Administered 2012-10-31: 30 mL

## 2012-10-31 MED ORDER — HYDRALAZINE HCL 20 MG/ML IJ SOLN
INTRAMUSCULAR | Status: DC | PRN
  Administered 2012-10-31 (×3): 2.5 mg via INTRAVENOUS

## 2012-10-31 MED ORDER — POLYETHYLENE GLYCOL 3350 17 G PO PACK: 17.0000 g | PACK | Freq: Every day | ORAL | Status: DC | PRN

## 2012-10-31 MED ORDER — ONDANSETRON HCL 4 MG/2ML IJ SOLN: 4.0000 mg | Freq: Four times a day (QID) | INTRAMUSCULAR | Status: DC | PRN

## 2012-10-31 MED ORDER — LORAZEPAM 0.5 MG PO TABS
0.5000 mg | ORAL_TABLET | Freq: Two times a day (BID) | ORAL | Status: DC
  Administered 2012-10-31 – 2012-11-02 (×5): 0.5 mg via ORAL
  Filled 2012-10-31 (×5): qty 1

## 2012-10-31 MED ORDER — PROBIOTIC PRODUCT PO CAPS: 1.0000 | ORAL_CAPSULE | Freq: Three times a day (TID) | ORAL | Status: DC

## 2012-10-31 MED ORDER — IRBESARTAN 300 MG PO TABS
300.0000 mg | ORAL_TABLET | Freq: Every day | ORAL | Status: DC
  Administered 2012-10-31 – 2012-11-02 (×3): 300 mg via ORAL
  Filled 2012-10-31 (×3): qty 1

## 2012-10-31 MED ORDER — DOCUSATE SODIUM 100 MG PO CAPS
100.0000 mg | ORAL_CAPSULE | Freq: Two times a day (BID) | ORAL | Status: DC
  Administered 2012-10-31 – 2012-11-02 (×5): 100 mg via ORAL

## 2012-10-31 MED ORDER — CEFAZOLIN SODIUM-DEXTROSE 2-3 GM-% IV SOLR
2.0000 g | Freq: Four times a day (QID) | INTRAVENOUS | Status: AC
  Administered 2012-10-31 (×2): 2 g via INTRAVENOUS
  Filled 2012-10-31 (×2): qty 50

## 2012-10-31 MED ORDER — SACCHAROMYCES BOULARDII 250 MG PO CAPS
250.0000 mg | ORAL_CAPSULE | Freq: Two times a day (BID) | ORAL | Status: DC
  Administered 2012-10-31 – 2012-11-02 (×5): 250 mg via ORAL
  Filled 2012-10-31 (×6): qty 1

## 2012-10-31 MED ORDER — ONDANSETRON HCL 4 MG PO TABS: 4.0000 mg | ORAL_TABLET | Freq: Four times a day (QID) | ORAL | Status: DC | PRN

## 2012-10-31 MED ORDER — DEXTROSE 5 % IV SOLN
500.0000 mg | Freq: Four times a day (QID) | INTRAVENOUS | Status: DC | PRN
  Administered 2012-11-01: 500 mg via INTRAVENOUS
  Filled 2012-10-31: qty 5

## 2012-10-31 MED ORDER — METFORMIN HCL 500 MG PO TABS
1000.0000 mg | ORAL_TABLET | Freq: Two times a day (BID) | ORAL | Status: DC
  Administered 2012-10-31 – 2012-11-02 (×4): 1000 mg via ORAL
  Filled 2012-10-31 (×6): qty 2

## 2012-10-31 MED ORDER — OLMESARTAN MEDOXOMIL-HCTZ 40-25 MG PO TABS: 1.0000 | ORAL_TABLET | Freq: Every morning | ORAL | Status: DC

## 2012-10-31 MED ORDER — HYDRALAZINE HCL 20 MG/ML IJ SOLN
10.0000 mg | Freq: Once | INTRAMUSCULAR | Status: AC
  Administered 2012-10-31: 10 mg via INTRAVENOUS
  Filled 2012-10-31: qty 0.5

## 2012-10-31 MED ORDER — OXYCODONE-ACETAMINOPHEN 5-325 MG PO TABS
1.0000 | ORAL_TABLET | ORAL | Status: DC | PRN
  Administered 2012-10-31 – 2012-11-02 (×9): 2 via ORAL
  Filled 2012-10-31 (×9): qty 2

## 2012-10-31 MED ORDER — GLYCOPYRROLATE 0.2 MG/ML IJ SOLN
INTRAMUSCULAR | Status: DC | PRN
  Administered 2012-10-31: .6 mg via INTRAVENOUS

## 2012-10-31 MED ORDER — SUCRALFATE 1 G PO TABS
1.0000 g | ORAL_TABLET | Freq: Three times a day (TID) | ORAL | Status: DC
  Administered 2012-10-31 – 2012-11-02 (×8): 1 g via ORAL
  Filled 2012-10-31 (×11): qty 1

## 2012-10-31 MED ORDER — LEVOTHYROXINE SODIUM 125 MCG PO TABS
125.0000 ug | ORAL_TABLET | ORAL | Status: DC
  Administered 2012-11-01 – 2012-11-02 (×2): 125 ug via ORAL
  Filled 2012-10-31 (×3): qty 1

## 2012-10-31 MED ORDER — PROMETHAZINE HCL 25 MG/ML IJ SOLN: 6.2500 mg | INTRAMUSCULAR | Status: DC | PRN

## 2012-10-31 MED ORDER — ALUM & MAG HYDROXIDE-SIMETH 200-200-20 MG/5ML PO SUSP: 30.0000 mL | ORAL | Status: DC | PRN

## 2012-10-31 MED ORDER — ONDANSETRON HCL 4 MG/2ML IJ SOLN
INTRAMUSCULAR | Status: DC | PRN
  Administered 2012-10-31: 4 mg via INTRAVENOUS

## 2012-10-31 MED ORDER — SODIUM CHLORIDE 0.9 % IR SOLN
Status: DC | PRN
  Administered 2012-10-31: 3000 mL

## 2012-10-31 MED ORDER — HYDROMORPHONE HCL PF 1 MG/ML IJ SOLN
1.0000 mg | INTRAMUSCULAR | Status: DC | PRN
  Administered 2012-10-31 (×2): 1 mg via INTRAVENOUS
  Filled 2012-10-31 (×2): qty 1

## 2012-10-31 MED ORDER — ESTRADIOL 1 MG PO TABS
0.5000 mg | ORAL_TABLET | Freq: Every day | ORAL | Status: DC
  Administered 2012-10-31 – 2012-11-02 (×3): 0.5 mg via ORAL
  Filled 2012-10-31 (×3): qty 0.5

## 2012-10-31 MED ORDER — FENTANYL CITRATE 0.05 MG/ML IJ SOLN
INTRAMUSCULAR | Status: DC | PRN
  Administered 2012-10-31: 100 ug via INTRAVENOUS
  Administered 2012-10-31 (×2): 50 ug via INTRAVENOUS
  Administered 2012-10-31: 100 ug via INTRAVENOUS

## 2012-10-31 MED ORDER — ZOLPIDEM TARTRATE 5 MG PO TABS: 5.0000 mg | ORAL_TABLET | Freq: Every evening | ORAL | Status: DC | PRN

## 2012-10-31 MED ORDER — POTASSIUM CHLORIDE ER 10 MEQ PO TBCR
10.0000 meq | EXTENDED_RELEASE_TABLET | Freq: Every day | ORAL | Status: DC
  Administered 2012-10-31 – 2012-11-02 (×3): 10 meq via ORAL
  Filled 2012-10-31 (×3): qty 1

## 2012-10-31 MED ORDER — RIVAROXABAN 10 MG PO TABS
10.0000 mg | ORAL_TABLET | Freq: Every day | ORAL | Status: DC
  Administered 2012-11-01 – 2012-11-02 (×2): 10 mg via ORAL
  Filled 2012-10-31 (×3): qty 1

## 2012-10-31 MED ORDER — PROPOFOL 10 MG/ML IV BOLUS
INTRAVENOUS | Status: DC | PRN
  Administered 2012-10-31: 140 mg via INTRAVENOUS

## 2012-10-31 MED ORDER — CEFAZOLIN SODIUM-DEXTROSE 2-3 GM-% IV SOLR
2.0000 g | INTRAVENOUS | Status: AC
  Administered 2012-10-31: 2 g via INTRAVENOUS

## 2012-10-31 MED ORDER — NEOSTIGMINE METHYLSULFATE 1 MG/ML IJ SOLN
INTRAMUSCULAR | Status: DC | PRN
  Administered 2012-10-31: 4 mg via INTRAVENOUS

## 2012-10-31 SURGICAL SUPPLY — 65 items
APL SKNCLS STERI-STRIP NONHPOA (GAUZE/BANDAGES/DRESSINGS) ×1
BANDAGE ELASTIC 6 VELCRO ST LF (GAUZE/BANDAGES/DRESSINGS) ×1 IMPLANT
BANDAGE ESMARK 6X9 LF (GAUZE/BANDAGES/DRESSINGS) ×1 IMPLANT
BENZOIN TINCTURE PRP APPL 2/3 (GAUZE/BANDAGES/DRESSINGS) ×2 IMPLANT
BLADE SAGITTAL 25.0X1.19X90 (BLADE) ×2 IMPLANT
BLADE SAW SAG 90X13X1.27 (BLADE) ×2 IMPLANT
BNDG CMPR 9X6 STRL LF SNTH (GAUZE/BANDAGES/DRESSINGS) ×1
BNDG ESMARK 6X9 LF (GAUZE/BANDAGES/DRESSINGS) ×2
BOWL SMART MIX CTS (DISPOSABLE) ×2 IMPLANT
CAP UPCHARGE REVISION TRAY ×1 IMPLANT
CAPT RP KNEE ×1 IMPLANT
CEMENT HV SMART SET (Cement) ×4 IMPLANT
CLOTH BEACON ORANGE TIMEOUT ST (SAFETY) ×2 IMPLANT
COVER SURGICAL LIGHT HANDLE (MISCELLANEOUS) ×2 IMPLANT
CUFF TOURNIQUET SINGLE 34IN LL (TOURNIQUET CUFF) ×2 IMPLANT
CUFF TOURNIQUET SINGLE 44IN (TOURNIQUET CUFF) IMPLANT
DRAPE EXTREMITY T 121X128X90 (DRAPE) ×2 IMPLANT
DRAPE U-SHAPE 47X51 STRL (DRAPES) ×2 IMPLANT
DRSG PAD ABDOMINAL 8X10 ST (GAUZE/BANDAGES/DRESSINGS) ×2 IMPLANT
DURAPREP 26ML APPLICATOR (WOUND CARE) ×2 IMPLANT
ELECT REM PT RETURN 9FT ADLT (ELECTROSURGICAL) ×2
ELECTRODE REM PT RTRN 9FT ADLT (ELECTROSURGICAL) ×1 IMPLANT
EVACUATOR 1/8 PVC DRAIN (DRAIN) ×2 IMPLANT
FACESHIELD LNG OPTICON STERILE (SAFETY) ×4 IMPLANT
GAUZE XEROFORM 5X9 LF (GAUZE/BANDAGES/DRESSINGS) ×2 IMPLANT
GLOVE BIOGEL PI IND STRL 8 (GLOVE) ×2 IMPLANT
GLOVE BIOGEL PI INDICATOR 8 (GLOVE) ×2
GLOVE ECLIPSE 7.5 STRL STRAW (GLOVE) ×4 IMPLANT
GOWN PREVENTION PLUS LG XLONG (DISPOSABLE) IMPLANT
GOWN STRL NON-REIN LRG LVL3 (GOWN DISPOSABLE) ×2 IMPLANT
GOWN STRL REIN XL XLG (GOWN DISPOSABLE) ×4 IMPLANT
HANDPIECE INTERPULSE COAX TIP (DISPOSABLE) ×2
HOOD PEEL AWAY FACE SHEILD DIS (HOOD) ×5 IMPLANT
IMMOBILIZER KNEE 20 (SOFTGOODS) ×2
IMMOBILIZER KNEE 20 THIGH 36 (SOFTGOODS) IMPLANT
IMMOBILIZER KNEE 22 UNIV (SOFTGOODS) ×1 IMPLANT
IMMOBILIZER KNEE 24 THIGH 36 (MISCELLANEOUS) IMPLANT
IMMOBILIZER KNEE 24 UNIV (MISCELLANEOUS)
KIT BASIN OR (CUSTOM PROCEDURE TRAY) ×2 IMPLANT
KIT ROOM TURNOVER OR (KITS) ×1 IMPLANT
MANIFOLD NEPTUNE II (INSTRUMENTS) ×2 IMPLANT
NDL HYPO 25GX1X1/2 BEV (NEEDLE) IMPLANT
NEEDLE HYPO 25GX1X1/2 BEV (NEEDLE) IMPLANT
NS IRRIG 1000ML POUR BTL (IV SOLUTION) ×2 IMPLANT
PACK TOTAL JOINT (CUSTOM PROCEDURE TRAY) ×2 IMPLANT
PAD ARMBOARD 7.5X6 YLW CONV (MISCELLANEOUS) ×3 IMPLANT
PAD CAST 4YDX4 CTTN HI CHSV (CAST SUPPLIES) ×1 IMPLANT
PADDING CAST COTTON 4X4 STRL (CAST SUPPLIES) ×2
PADDING CAST COTTON 6X4 STRL (CAST SUPPLIES) ×1 IMPLANT
SET HNDPC FAN SPRY TIP SCT (DISPOSABLE) ×1 IMPLANT
SPONGE GAUZE 4X4 12PLY (GAUZE/BANDAGES/DRESSINGS) ×2 IMPLANT
STAPLER VISISTAT 35W (STAPLE) IMPLANT
STRIP CLOSURE SKIN 1/2X4 (GAUZE/BANDAGES/DRESSINGS) ×2 IMPLANT
SUCTION FRAZIER TIP 10 FR DISP (SUCTIONS) ×2 IMPLANT
SUT MON AB 3-0 SH 27 (SUTURE)
SUT MON AB 3-0 SH27 (SUTURE) IMPLANT
SUT VIC AB 0 CTB1 27 (SUTURE) ×4 IMPLANT
SUT VIC AB 1 CT1 27 (SUTURE) ×4
SUT VIC AB 1 CT1 27XBRD ANBCTR (SUTURE) ×2 IMPLANT
SUT VIC AB 2-0 CTB1 (SUTURE) ×4 IMPLANT
SYR CONTROL 10ML LL (SYRINGE) IMPLANT
TOWEL OR 17X24 6PK STRL BLUE (TOWEL DISPOSABLE) ×2 IMPLANT
TOWEL OR 17X26 10 PK STRL BLUE (TOWEL DISPOSABLE) ×2 IMPLANT
TRAY FOLEY CATH 14FR (SET/KITS/TRAYS/PACK) ×2 IMPLANT
WATER STERILE IRR 1000ML POUR (IV SOLUTION) ×4 IMPLANT

## 2012-10-31 NOTE — Anesthesia Preprocedure Evaluation (Addendum)
Anesthesia Evaluation  Patient identified by MRN, date of birth, ID band Patient awake    Reviewed: Allergy & Precautions, H&P , NPO status , Patient's Chart, lab work & pertinent test results, reviewed documented beta blocker date and time   Airway Mallampati: II TM Distance: >3 FB Neck ROM: Full    Dental  (+) Partial Lower and Partial Upper   Pulmonary neg pulmonary ROS,  breath sounds clear to auscultation  + decreased breath sounds      Cardiovascular hypertension, Pt. on medications Rhythm:Regular Rate:Normal  Denies cardiac symptoms   Neuro/Psych PSYCHIATRIC DISORDERS Anxiety Depression negative neurological ROS     GI/Hepatic negative GI ROS, Neg liver ROS, GERD-  Medicated and Controlled,  Endo/Other  diabetes, Type 2, Oral Hypoglycemic AgentsHypothyroidism Morbid obesityThyroid replacement  Renal/GU negative Renal ROS  negative genitourinary   Musculoskeletal negative musculoskeletal ROS (+)   Abdominal   Peds negative pediatric ROS (+)  Hematology   Anesthesia Other Findings   Reproductive/Obstetrics negative OB ROS                          Anesthesia Physical  Anesthesia Plan  ASA: III  Anesthesia Plan: General   Post-op Pain Management:    Induction: Intravenous  Airway Management Planned: Oral ETT  Additional Equipment:   Intra-op Plan:   Post-operative Plan: Extubation in OR  Informed Consent:   Dental advisory given  Plan Discussed with: CRNA and Surgeon  Anesthesia Plan Comments:        Anesthesia Quick Evaluation

## 2012-10-31 NOTE — Brief Op Note (Signed)
10/31/2012  10:29 AM  PATIENT:  Amanda Cain  67 y.o. female  PRE-OPERATIVE DIAGNOSIS:  osteoarthritis right knee  POST-OPERATIVE DIAGNOSIS:  osteoarthritis right knee  PROCEDURE:  Procedure(s): RIGHT TOTAL KNEE ARTHROPLASTY (Right)  SURGEON:  Surgeon(s) and Role:    * Alta Corning, MD - Primary  PHYSICIAN ASSISTANT:   ASSISTANTS: bethune   ANESTHESIA:   general  EBL:  Total I/O In: 1000 [I.V.:1000] Out: 600 [Urine:500; Blood:100]  BLOOD ADMINISTERED:none  DRAINS: (1) Hemovact drain(s) in the r knee with  Suction Open   LOCAL MEDICATIONS USED:  NONE  SPECIMEN:  No Specimen  DISPOSITION OF SPECIMEN:  N/A  COUNTS:  YES  TOURNIQUET:   Total Tourniquet Time Documented: Thigh (Right) - 68 minutes Total: Thigh (Right) - 68 minutes   DICTATION: .Other Dictation: Dictation Number [id number]  PLAN OF CARE: Admit to inpatient   PATIENT DISPOSITION:  PACU - hemodynamically stable.   Delay start of Pharmacological VTE agent (>24hrs) due to surgical blood loss or risk of bleeding: no

## 2012-10-31 NOTE — Preoperative (Signed)
Beta Blockers   Reason not to administer Beta Blockers:Not Applicable 

## 2012-10-31 NOTE — Transfer of Care (Signed)
Immediate Anesthesia Transfer of Care Note  Patient: Amanda Cain  Procedure(s) Performed: Procedure(s): RIGHT TOTAL KNEE ARTHROPLASTY (Right)  Patient Location: PACU  Anesthesia Type:General and Regional  Level of Consciousness: awake, oriented and patient cooperative  Airway & Oxygen Therapy: Patient Spontanous Breathing and Patient connected to face mask oxygen  Post-op Assessment: Report given to PACU RN and Post -op Vital signs reviewed and stable  Post vital signs: Reviewed and stable  Complications: No apparent anesthesia complications

## 2012-10-31 NOTE — Anesthesia Procedure Notes (Signed)
Anesthesia Regional Block:  Femoral nerve block  Pre-Anesthetic Checklist: ,, timeout performed, Correct Patient, Correct Site, Correct Laterality, Correct Procedure, Correct Position, site marked, Risks and benefits discussed,  Surgical consent,  Pre-op evaluation,  At surgeon's request and post-op pain management  Laterality: Right and Lower  Prep: chloraprep       Needles:  Injection technique: Single-shot  Needle Type: Stimiplex     Needle Length: 10cm 10 cm Needle Gauge: 21 G    Additional Needles:  Procedures: ultrasound guided (picture in chart) Femoral nerve block Narrative:   Performed by: Personally  Anesthesiologist: Montez Hageman MD  Additional Notes: Patient tolerated the procedure well without complications  Femoral nerve block

## 2012-10-31 NOTE — Evaluation (Signed)
Physical Therapy Evaluation Patient Details Name: Amanda Cain MRN: [id number] DOB: 08-Jul-1945 Today's Date: 10/31/2012 Time: [id number] PT Time Calculation (min): 23 min  PT Assessment / Plan / Recommendation Clinical Impression  Pt s/p R TKR presents with decreased R LE strength/ROM and post op pain limiting functional mobility    PT Assessment  Patient needs continued PT services    Follow Up Recommendations  SNF    Does the patient have the potential to tolerate intense rehabilitation      Barriers to Discharge        Equipment Recommendations  None recommended by PT    Recommendations for Other Services OT consult   Frequency 7X/week    Precautions / Restrictions Precautions Precautions: Fall;Knee Required Braces or Orthoses: Knee Immobilizer - Right Knee Immobilizer - Right: Discontinue once straight leg raise with < 10 degree lag Restrictions Weight Bearing Restrictions: No Other Position/Activity Restrictions: WBAT   Pertinent Vitals/Pain 5/10, premed, ice packs provided      Mobility  Bed Mobility Bed Mobility: Supine to Sit Supine to Sit: 3: Mod assist Details for Bed Mobility Assistance: cues for use of L LE to self assist Transfers Transfers: Sit to Stand;Stand to Sit Sit to Stand: 1: +2 Total assist;With upper extremity assist;From bed Sit to Stand: Patient Percentage: 60% Stand to Sit: 1: +2 Total assist;To chair/3-in-1;With upper extremity assist Stand to Sit: Patient Percentage: 70% Details for Transfer Assistance: cues for LE management and use of UEs to self assist Ambulation/Gait Ambulation/Gait Assistance: 1: +2 Total assist Ambulation/Gait: Patient Percentage: 70% Ambulation Distance (Feet): 13 Feet Assistive device: Rolling walker Ambulation/Gait Assistance Details: cues for sequence, posture, position from RW and increased UE WB Gait Pattern: Step-to pattern;Decreased step length - right;Decreased stance time - right Stairs: No     Exercises Total Joint Exercises Ankle Circles/Pumps: AROM;10 reps;Supine;Both Quad Sets: AROM;10 reps;Supine;Both Straight Leg Raises: AAROM;Right;5 reps;Supine   PT Diagnosis: Difficulty walking  PT Problem List: Decreased strength;Decreased range of motion;Decreased activity tolerance;Decreased mobility;Decreased knowledge of use of DME;Obesity;Decreased knowledge of precautions;Pain PT Treatment Interventions: DME instruction;Gait training;Stair training;Functional mobility training;Therapeutic activities;Therapeutic exercise;Patient/family education   PT Goals Acute Rehab PT Goals PT Goal Formulation: With patient Time For Goal Achievement: 11/07/12 Potential to Achieve Goals: Good Pt will go Supine/Side to Sit: with supervision PT Goal: Supine/Side to Sit - Progress: Goal set today Pt will go Sit to Supine/Side: with supervision PT Goal: Sit to Supine/Side - Progress: Goal set today Pt will go Sit to Stand: with supervision PT Goal: Sit to Stand - Progress: Goal set today Pt will go Stand to Sit: with supervision PT Goal: Stand to Sit - Progress: Goal set today Pt will Ambulate: 51 - 150 feet;with supervision;with rolling walker PT Goal: Ambulate - Progress: Goal set today  Visit Information  Last PT Received On: 10/31/12 Assistance Needed: +2    Subjective Data  Subjective: I want to get out of bed and sit in the chair for a while Patient Stated Goal: Rehab at [facility] and home   Prior [place] Lives With: Alone Communication Communication: No difficulties    Cognition  Cognition Arousal/Alertness: Awake/alert Behavior During Therapy: WFL for tasks assessed/performed Overall Cognitive Status: Within Functional Limits for tasks assessed    Extremity/Trunk Assessment Right Upper Extremity Assessment RUE ROM/Strength/Tone: [facility] for tasks assessed Left Upper Extremity Assessment LUE ROM/Strength/Tone: [facility] for tasks assessed Right Lower Extremity  Assessment RLE ROM/Strength/Tone: Deficits RLE ROM/Strength/Tone Deficits: quad strength 2/5:   ROM  not assessed Left Lower Extremity Assessment LLE ROM/Strength/Tone: WFL for tasks assessed   Balance    End of Session PT - End of Session Equipment Utilized During Treatment: Gait belt;Right knee immobilizer Activity Tolerance: Patient tolerated treatment well Patient left: in chair;with call bell/phone within reach;with family/visitor present Nurse Communication: Mobility status  GP     Jabari Swoveland 10/31/2012, 5:17 PM

## 2012-10-31 NOTE — Anesthesia Postprocedure Evaluation (Signed)
  Anesthesia Post-op Note  Patient: Amanda Cain  Procedure(s) Performed: Procedure(s) (LRB): RIGHT TOTAL KNEE ARTHROPLASTY (Right)  Patient Location: PACU  Anesthesia Type: GA combined with regional for post-op pain  Level of Consciousness: awake and alert   Airway and Oxygen Therapy: Patient Spontanous Breathing  Post-op Pain: mild  Post-op Assessment: Post-op Vital signs reviewed, Patient's Cardiovascular Status Stable, Respiratory Function Stable, Patent Airway and No signs of Nausea or vomiting  Last Vitals:  Filed Vitals:   10/31/12 1344  BP: 147/78  Pulse: 87  Temp: 36.6 C  Resp: 18    Post-op Vital Signs: stable   Complications: No apparent anesthesia complications

## 2012-10-31 NOTE — Progress Notes (Signed)
Utilization review completed.  
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
## 2012-11-01 ENCOUNTER — Encounter (HOSPITAL_COMMUNITY): Payer: Self-pay | Admitting: Orthopedic Surgery

## 2012-11-01 DIAGNOSIS — D62 Acute posthemorrhagic anemia: Secondary | ICD-10-CM | POA: Diagnosis not present

## 2012-11-01 LAB — CBC
HCT: 27.3 % — ABNORMAL LOW (ref 36.0–46.0)
Hemoglobin: 8.6 g/dL — ABNORMAL LOW (ref 12.0–15.0)
MCH: 26.7 pg (ref 26.0–34.0)
MCHC: 31.5 g/dL (ref 30.0–36.0)
MCV: 84.8 fL (ref 78.0–100.0)
Platelets: 287 10*3/uL (ref 150–400)
RBC: 3.22 MIL/uL — ABNORMAL LOW (ref 3.87–5.11)
RDW: 15.2 % (ref 11.5–15.5)
WBC: 7.6 10*3/uL (ref 4.0–10.5)

## 2012-11-01 LAB — BASIC METABOLIC PANEL
BUN: 14 mg/dL (ref 6–23)
CO2: 28 mEq/L (ref 19–32)
Calcium: 8.3 mg/dL — ABNORMAL LOW (ref 8.4–10.5)
Chloride: 94 mEq/L — ABNORMAL LOW (ref 96–112)
Creatinine, Ser: 1.1 mg/dL (ref 0.50–1.10)
GFR calc Af Amer: 58 mL/min — ABNORMAL LOW (ref >90)
GFR calc non Af Amer: 50 mL/min — ABNORMAL LOW (ref >90)
Glucose, Bld: 142 mg/dL — ABNORMAL HIGH (ref 70–99)
Potassium: 3.8 mEq/L (ref 3.5–5.1)
Sodium: 132 mEq/L — ABNORMAL LOW (ref 135–145)

## 2012-11-01 LAB — GLUCOSE, CAPILLARY
Glucose-Capillary: 136 mg/dL — ABNORMAL HIGH (ref 70–99)
Glucose-Capillary: 116 mg/dL — ABNORMAL HIGH (ref 70–99)
Glucose-Capillary: 141 mg/dL — ABNORMAL HIGH (ref 70–99)
Glucose-Capillary: 63 mg/dL — ABNORMAL LOW (ref 70–99)
Glucose-Capillary: 107 mg/dL — ABNORMAL HIGH (ref 70–99)

## 2012-11-01 NOTE — Progress Notes (Signed)
Subjective: 1 Day Post-Op Procedure(s) (LRB): RIGHT TOTAL KNEE ARTHROPLASTY (Right) Patient reports pain as 4 on 0-10 scale.   Taking po ok.OOB to chair yest.  Objective: Vital signs in last 24 hours: Temp:  [97.2 F (36.2 C)-98.8 F (37.1 C)] 98.3 F (36.8 C) (06/05 0531) Pulse Rate:  [75-108] 100 (06/05 0739) Resp:  [14-23] 16 (06/05 0531) BP: (136-184)/(69-105) 184/74 mmHg (06/05 0531) SpO2:  [95 %-100 %] 100 % (06/05 0531) Weight:  [92.534 kg (204 lb)] 92.534 kg (204 lb) (06/04 1226)  Intake/Output from previous day: 06/04 0701 - 06/05 0700 In: 3308.8 [P.O.:480; I.V.:2778.8; IV Piggyback:50] Out: [id number] [Urine:2700; Drains:451; Blood:100] Intake/Output this shift: Total I/O In: -  Out: 60 [Urine:50; Drains:10]   Recent Labs  11/01/12 0438  HGB 8.6*    Recent Labs  11/01/12 0438  WBC 7.6  RBC 3.22*  HCT 27.3*  PLT 287    Recent Labs  11/01/12 0438  NA 132*  K 3.8  CL 94*  CO2 28  BUN 14  CREATININE 1.10  GLUCOSE 142*  CALCIUM 8.3*  BP 184/74  Pulse 100  Temp(Src) 98.3 F (36.8 C) (Oral)  Resp 16  Ht 4\' 10"  (1.473 m)  Wt 92.534 kg (204 lb)  BMI 42.65 kg/m2  SpO2 100%  General Appearance:  Alert, cooperative, no distress, appears stated age  Head:  Normocephalic, without obvious abnormality, atraumatic  Eyes:  PERRL, conjunctiva/corneas clear, EOM's intact, fundi benign, both eyes  Ears:  Normal TM's and external ear canals, both ears  Nose: Nares normal, septum midline,mucosa normal, no drainage or sinus tenderness  Throat: Lips, mucosa, and tongue normal; teeth and gums normal  Neck: Supple, symmetrical, trachea midline, no adenopathy;  thyroid: not enlarged, symmetric, no tenderness/mass/nodules; no carotid bruit or JVD  Back:   Symmetric, no curvature, ROM normal, no CVA tenderness  Lungs:   Clear to auscultation bilaterally, respirations unlabored     Heart:  Regular rate and rhythm, S1 and S2 normal, no murmur, rub, or gallop  Abdomen:    Soft, non-tender, bowel sounds active all four quadrants,  no masses, no organomegaly        Pulses: 2+ and symmetric  Skin: Skin color, texture, turgor normal, no rashes or lesions  Lymph nodes: Cervical, supraclavicular, and axillary nodes normal  Neurologic: Normal    Right knee exam: Neurovascular intact Sensation intact distally Intact pulses distally Dorsiflexion/Plantar flexion intact Incision: dressing C/D/I Compartment soft  Assessment/Plan: 1 Day Post-Op Procedure(s) (LRB): RIGHT TOTAL KNEE ARTHROPLASTY (Right) Hyponatremia Acute blood loss anemia Diabetes Plan: Watch Sodium. Check BMET in AM. Cont oral iron  Check CBC in AM. Drain pulled. Discharge to SNF Cont PT [place] G 11/01/2012, 8:29 AM

## 2012-11-01 NOTE — Op Note (Signed)
NAME:  Amanda Cain, PEIKERT NO.:  [id number]  MEDICAL RECORD NO.:  [id number]  LOCATION:  [id number]                         FACILITY:  [facility] [address]  PHYSICIAN:  Alta Corning, M.D.   DATE OF BIRTH:  07/22/1945  DATE OF PROCEDURE:  10/31/2012 DATE OF DISCHARGE:                              OPERATIVE REPORT   She is a 67 year old female in the Orthopedic Surgery Service.  PREOPERATIVE DIAGNOSIS:  Endstage degenerative joint disease, right knee.  POSTOPERATIVE DIAGNOSIS:  Endstage degenerative joint disease, right knee.  PROCEDURE: 1. Right total knee replacement with Sigma system, size 2 femur, size     2 MBT revision tray tibia, 35 mm all poly patella and a 12.5-mm     bridging bearing. 2. Lateral retinacular release right.  SURGEON:  Alta Corning, MD.  ASSISTANT:  Gary Fleet, PA.  ANESTHESIA:  General.  BRIEF HISTORY:  This patient is a 67 year old female with a history of severe bilateral degenerative joint disease with her left knee some years ago and she did great with that because of her valgus alignment, we felt like she needed an MBT tray, this was chosen to used preoperative.  She was taken to the operating room for this procedure.  DESCRIPTION OF PROCEDURE:  The patient was taken to the operative room, and after adequate anesthesia was obtained with general anesthetic, the patient was placed supine on the operating table.  The right leg prepped and draped in sterile fashion.  Following this, the leg was exsanguination, blood pressure tourniquet was inflated to 350 mmHg. Following this, a midline incision made in the subcutaneous tissue down with extensor mechanism and medial parapatellar arthrotomy was undertaken.  At this point, mediolateral meniscus were removed, retropatellar fat pad, synovium in the anterior aspect of the femur and anterior-posterior cruciate.  Following this, the attention was turned to the tibia which was cut with an extramedullary  guide at 2 degrees of posterior slope and this tibia was cut at this point.  Once this was done, attention turned to the femur which the a 9-mm distal femur was removed and besides size to 1.5, we felt like we had used a 2 on the other side.  She certainly could take the 2 in the mediolateral plane and we felt that it was appropriate, so 2 block was chosen, had a perfect anterior cut, did get some bone posteriorly on both sides with the chamfers and box.  Once this was done, attention turned to the tibia which was drilled and keeled for MBT revision tray 10 mm trial was placed, and following this, attention was turned to the patella, cut down the level of 13 mm and then lugs were drilled for the patella. Lugs were drilled in the femur, and at this point, the knee was put through a range of motion.  Patella was tracking laterally and a lateral retinacular release was performed and this centralized patellar tracking.  Once this was done, all trial components were removed.  The knee was copiously and thoroughly lavaged, suctioned dry.  At this point, attention was turned towards placement of the final components that were cemented into place, size 2 femur, size 2 MBT revision  tray tibia, and 10 mm bridging bearing trial was placed.  We allowed the cement to harden.  Once the cement was completely hardened.  All excess bone cement had been removed.  The tourniquet was let down.  All bleeding was controlled with electrocautery.  We tried a 12.5, just because we done that on the other side, and actually she did come in to decent extension and was little better in flexion so at that point, went with 12.5, perfect neutral long alignment at this point, the valgus has been completely resolved.  The knee was copiously and thoroughly lavaged.  At this point, bleeding was controlled with electrocautery and the medium Hemovac drain was placed.  The knee was then closed with #1 Vicryl running, skin with  #0 and 2-0 Vicryl, and skin staples.  Sterile compressive dressing was applied and the patient taken to the recovery in satisfactory condition.  Estimated blood loss for the procedure was minimal.     Alta Corning, M.D.     Corliss Skains  D:  10/31/2012  T:  11/01/2012  Job:  [id number]

## 2012-11-01 NOTE — Progress Notes (Signed)
Clinical Social Work Department BRIEF PSYCHOSOCIAL ASSESSMENT 11/01/2012  Patient:  Amanda, Cain     Account Number:  [id number]     Admit date:  10/31/2012  Clinical Social Worker:  Lacie Scotts  Date/Time:  11/01/2012 01:02 PM  Referred by:  Physician  Date Referred:  10/31/2012 Referred for  SNF Placement   Other Referral:   Interview type:  Patient Other interview type:    PSYCHOSOCIAL DATA Living Status:  ALONE Admitted from facility:   Level of care:   Primary support name:  Myrene Buddy Primary support relationship to patient:  CHILD, ADULT Degree of support available:   supportive    CURRENT CONCERNS Current Concerns  Post-Acute Placement   Other Concerns:    SOCIAL WORK ASSESSMENT / PLAN Pt is a 67 yr old female living alone prior to hospitalization. CSW met with pt/family to assist with d/c planning. Pt has made prior arrangements to have [facility] Rehab at [facility] following hospital d/c. CSW has contacted SNF and d/c plans have been confirmed. CSW will follow to assist with d/c planning to SNF.   Assessment/plan status:  Psychosocial Support/Ongoing Assessment of Needs Other assessment/ plan:   Information/referral to community resources:   None needed at this time.    PATIENT'S/FAMILY'S RESPONSE TO PLAN OF CARE: Pt is looking forward to having rehab at [facility].   Werner Lean LCSW [phone]

## 2012-11-01 NOTE — Progress Notes (Signed)
11/01/12 Nursing 2200 Patients  cbg 63 at this time. Pt requested peanut butter /graham crackers and oj. Retested cbg at 11pm for 107

## 2012-11-01 NOTE — Progress Notes (Signed)
Physical Therapy Treatment Patient Details Name: Amanda Cain MRN: [id number] DOB: [birth year] Today's Date: 11/01/2012 Time: [id number] PT Time Calculation (min): 33 min  PT Assessment / Plan / Recommendation Comments on Treatment Session       Follow Up Recommendations  SNF     Does the patient have the potential to tolerate intense rehabilitation     Barriers to Discharge        Equipment Recommendations  None recommended by PT    Recommendations for Other Services OT consult  Frequency 7X/week   Plan Discharge plan remains appropriate    Precautions / Restrictions Precautions Precautions: Fall;Knee Required Braces or Orthoses: Knee Immobilizer - Right Knee Immobilizer - Right: Discontinue once straight leg raise with < 10 degree lag Restrictions Weight Bearing Restrictions: No Other Position/Activity Restrictions: WBAT   Pertinent Vitals/Pain 4/10; premed, cold packs provided    Mobility  Bed Mobility Bed Mobility: Supine to Sit Supine to Sit: 3: Mod assist Details for Bed Mobility Assistance: cues for use of L LE to self assist Transfers Transfers: Sit to Stand;Stand to Sit Sit to Stand: 3: Mod assist;With upper extremity assist;From bed Stand to Sit: 3: Mod assist;With upper extremity assist;With armrests;To chair/3-in-1 Details for Transfer Assistance: cues for LE management and use of UEs to self assist Ambulation/Gait Ambulation/Gait Assistance: 3: Mod assist Ambulation Distance (Feet): 15 Feet Assistive device: Rolling walker Ambulation/Gait Assistance Details: cues for posture, sequence, stride length and position from RW Gait Pattern: Step-to pattern;Decreased step length - right;Decreased stance time - right General Gait Details: ltd by pt c/o fatigue/weakness Stairs: No    Exercises Total Joint Exercises Ankle Circles/Pumps: AROM;10 reps;Supine;Both Quad Sets: AROM;10 reps;Supine;Both Heel Slides: AAROM;10 reps;Supine;Right Straight Leg Raises:  AAROM;Right;Supine;15 reps   PT Diagnosis:    PT Problem List:   PT Treatment Interventions:     PT Goals Acute Rehab PT Goals PT Goal Formulation: With patient Time For Goal Achievement: 11/07/12 Potential to Achieve Goals: Good Pt will go Supine/Side to Sit: with supervision PT Goal: Supine/Side to Sit - Progress: Progressing toward goal Pt will go Sit to Supine/Side: with supervision PT Goal: Sit to Supine/Side - Progress: Progressing toward goal Pt will go Sit to Stand: with supervision PT Goal: Sit to Stand - Progress: Progressing toward goal Pt will go Stand to Sit: with supervision PT Goal: Stand to Sit - Progress: Progressing toward goal Pt will Ambulate: 51 - 150 feet;with supervision;with rolling walker PT Goal: Ambulate - Progress: Progressing toward goal  Visit Information  Last PT Received On: 11/01/12 Assistance Needed: +2 (for chair follow with ambulation)    Subjective Data  Subjective: I didn't sleep at all.  I just feel tired and weak Patient Stated Goal: Rehab at [facility] and home   Cognition  Cognition Arousal/Alertness: Awake/alert Behavior During Therapy: [facility] for tasks assessed/performed Overall Cognitive Status: Within Functional Limits for tasks assessed    Balance     End of Session PT - End of Session Equipment Utilized During Treatment: Gait belt;Right knee immobilizer Activity Tolerance: Patient tolerated treatment well Patient left: in chair;with call bell/phone within reach;with family/visitor present Nurse Communication: Mobility status   GP     Luv Mish 11/01/2012, 12:29 PM

## 2012-11-01 NOTE — Progress Notes (Signed)
Clinical Social Work Department CLINICAL SOCIAL WORK PLACEMENT NOTE 11/01/2012  Patient:  Amanda Cain, Amanda Cain  Account Number:  [id number] Admit date:  10/31/2012  Clinical Social Worker:  Werner Lean, LCSW  Date/time:  11/01/2012 01:09 PM  Clinical Social Work is seeking post-discharge placement for this patient at the following level of care:   SKILLED NURSING   (*CSW will update this form in Epic as items are completed)     Patient/family provided with [facility] Department of Clinical Social Work's list of facilities offering this level of care within the geographic area requested by the patient (or if unable, by the patient's family).  11/01/2012  Patient/family informed of their freedom to choose among providers that offer the needed level of care, that participate in Medicare, Medicaid or managed care program needed by the patient, have an available bed and are willing to accept the patient.    Patient/family informed of [facility]' ownership interest in [facility], as well as of the fact that they are under no obligation to receive care at this facility.  PASARR submitted to [employer]DS on 07/18/2011 PASARR number received from [employer] on   FL2 transmitted to all facilities in geographic area requested by pt/family on  11/01/2012 FL2 transmitted to all facilities within larger geographic area on   Patient informed that his/her managed care company has contracts with or will negotiate with  certain facilities, including the following:     Patient/family informed of bed offers received:  11/01/2012 Patient chooses bed at [place] Physician recommends and patient chooses bed at    Patient to be transferred to [place] on   Patient to be transferred to facility by   The following physician request were entered in Epic:   Additional Comments:  Werner Lean LCSW [phone]

## 2012-11-01 NOTE — Progress Notes (Signed)
Physical Therapy Treatment Patient Details Name: Amanda Cain MRN: [id number] DOB: [birth year] Today's Date: 11/01/2012 Time: 1542-1600 PT Time Calculation (min): 18 min  PT Assessment / Plan / Recommendation Comments on Treatment Session       Follow Up Recommendations  SNF     Does the patient have the potential to tolerate intense rehabilitation     Barriers to Discharge        Equipment Recommendations  None recommended by PT    Recommendations for Other Services OT consult  Frequency 7X/week   Plan Discharge plan remains appropriate    Precautions / Restrictions Precautions Precautions: Fall;Knee Required Braces or Orthoses: Knee Immobilizer - Right Knee Immobilizer - Right: Discontinue once straight leg raise with < 10 degree lag Restrictions Other Position/Activity Restrictions: WBAT   Pertinent Vitals/Pain 5/10; premed, ice packs provided    Mobility  Bed Mobility Bed Mobility: Sit to Supine Sit to Supine: 3: Mod assist Details for Bed Mobility Assistance: cues for use of L LE to self assist Transfers Transfers: Sit to Stand;Stand to Sit Sit to Stand: 3: Mod assist;With upper extremity assist;From chair/3-in-1 Stand to Sit: With upper extremity assist;4: Min assist;3: Mod assist;To bed Details for Transfer Assistance: cues for LE management and use of UEs to self assist Ambulation/Gait Ambulation/Gait Assistance: 1: +2 Total assist Ambulation/Gait: Patient Percentage: 70% Ambulation Distance (Feet): 33 Feet Assistive device: Rolling walker Ambulation/Gait Assistance Details: constant cues for sequence, posture, stride length, increased UE WB and position from RW Gait Pattern: Step-to pattern;Decreased step length - right;Decreased stance time - right General Gait Details: two episodes balance loss requiring mod assist to correct Stairs: No    Exercises     PT Diagnosis:    PT Problem List:   PT Treatment Interventions:     PT Goals Acute Rehab PT  Goals PT Goal Formulation: With patient Time For Goal Achievement: 11/07/12 Potential to Achieve Goals: Good Pt will go Supine/Side to Sit: with supervision PT Goal: Supine/Side to Sit - Progress: Progressing toward goal Pt will go Sit to Supine/Side: with supervision PT Goal: Sit to Supine/Side - Progress: Progressing toward goal Pt will go Sit to Stand: with supervision PT Goal: Sit to Stand - Progress: Progressing toward goal Pt will go Stand to Sit: with supervision PT Goal: Stand to Sit - Progress: Progressing toward goal Pt will Ambulate: 51 - 150 feet;with supervision;with rolling walker PT Goal: Ambulate - Progress: Progressing toward goal  Visit Information  Last PT Received On: 11/01/12 Assistance Needed: +2    Subjective Data  Patient Stated Goal: Rehab at [facility] and home   Cognition  Cognition Arousal/Alertness: Awake/alert Behavior During Therapy: [facility] for tasks assessed/performed Overall Cognitive Status: Within Functional Limits for tasks assessed    Balance     End of Session PT - End of Session Equipment Utilized During Treatment: Gait belt;Right knee immobilizer Activity Tolerance: Patient tolerated treatment well Patient left: in bed;with call bell/phone within reach Nurse Communication: Mobility status CPM Right Knee CPM Right Knee: On   GP     Isadore Palecek 11/01/2012, 5:09 PM

## 2012-11-02 LAB — BASIC METABOLIC PANEL
BUN: 11 mg/dL (ref 6–23)
CO2: 26 mEq/L (ref 19–32)
Calcium: 8.1 mg/dL — ABNORMAL LOW (ref 8.4–10.5)
Chloride: 91 mEq/L — ABNORMAL LOW (ref 96–112)
Creatinine, Ser: 1.02 mg/dL (ref 0.50–1.10)
GFR calc Af Amer: 64 mL/min — ABNORMAL LOW (ref >90)
GFR calc non Af Amer: 55 mL/min — ABNORMAL LOW (ref >90)
Glucose, Bld: 130 mg/dL — ABNORMAL HIGH (ref 70–99)
Potassium: 3.4 mEq/L — ABNORMAL LOW (ref 3.5–5.1)
Sodium: 128 mEq/L — ABNORMAL LOW (ref 135–145)

## 2012-11-02 LAB — CBC
HCT: 25.7 % — ABNORMAL LOW (ref 36.0–46.0)
Hemoglobin: 8.4 g/dL — ABNORMAL LOW (ref 12.0–15.0)
MCH: 27.5 pg (ref 26.0–34.0)
MCHC: 32.7 g/dL (ref 30.0–36.0)
MCV: 84.3 fL (ref 78.0–100.0)
Platelets: 277 10*3/uL (ref 150–400)
RBC: 3.05 MIL/uL — ABNORMAL LOW (ref 3.87–5.11)
RDW: 15.2 % (ref 11.5–15.5)
WBC: 10.2 10*3/uL (ref 4.0–10.5)

## 2012-11-02 LAB — GLUCOSE, CAPILLARY
Glucose-Capillary: 127 mg/dL — ABNORMAL HIGH (ref 70–99)
Glucose-Capillary: 138 mg/dL — ABNORMAL HIGH (ref 70–99)

## 2012-11-02 MED ORDER — RIVAROXABAN 10 MG PO TABS: 10.0000 mg | ORAL_TABLET | Freq: Every day | ORAL | Status: DC

## 2012-11-02 MED ORDER — OXYCODONE-ACETAMINOPHEN 5-325 MG PO TABS: 1.0000 | ORAL_TABLET | ORAL | Status: DC | PRN

## 2012-11-02 MED ORDER — GUAIFENESIN ER 600 MG PO TB12
600.0000 mg | ORAL_TABLET | Freq: Two times a day (BID) | ORAL | Status: DC | PRN
  Administered 2012-11-02: 600 mg via ORAL
  Filled 2012-11-02: qty 1

## 2012-11-02 MED ORDER — METHOCARBAMOL 500 MG PO TABS: 500.0000 mg | ORAL_TABLET | Freq: Three times a day (TID) | ORAL | Status: DC

## 2012-11-02 NOTE — Progress Notes (Signed)
Patient is set to discharge to [facility] today. Patient aware, states that family will be bringing clothes for her to change into. Discharge packet in [place]. [facility] called for 3:30p pickup.   Clinical Social Work Department CLINICAL SOCIAL WORK PLACEMENT NOTE 11/02/2012  Patient:  Amanda Cain, Amanda Cain  Account Number:  [id number] Admit date:  10/31/2012  Clinical Social Worker:  Werner Lean, LCSW  Date/time:  11/01/2012 01:09 PM  Clinical Social Work is seeking post-discharge placement for this patient at the following level of care:   SKILLED NURSING   (*CSW will update this form in Epic as items are completed)     Patient/family provided with [place] Department of Clinical Social Work's list of facilities offering this level of care within the geographic area requested by the patient (or if unable, by the patient's family).  11/01/2012  Patient/family informed of their freedom to choose among providers that offer the needed level of care, that participate in Medicare, Medicaid or managed care program needed by the patient, have an available bed and are willing to accept the patient.    Patient/family informed of [facility]' ownership interest in [facility], as well as of the fact that they are under no obligation to receive care at this facility.  PASARR submitted to EDS on 07/18/2011 PASARR number received from EDS on   FL2 transmitted to all facilities in geographic area requested by pt/family on  11/01/2012 FL2 transmitted to all facilities within larger geographic area on   Patient informed that his/her managed care company has contracts with or will negotiate with  certain facilities, including the following:     Patient/family informed of bed offers received:  11/01/2012 Patient chooses bed at [place] Physician recommends and patient chooses bed at    Patient to be transferred to [place] on  11/02/2012 Patient to be transferred to facility  by [facility]  The following physician request were entered in Epic:   Additional Comments:   Winfred Leeds, [place] Worker cell #: [phone]

## 2012-11-02 NOTE — Progress Notes (Signed)
Physical Therapy Treatment Patient Details Name: Amanda Cain MRN: [id number] DOB: 11-13-1945 Today's Date: 11/02/2012 Time: [id number] PT Time Calculation (min): 15 min  PT Assessment / Plan / Recommendation Comments on Treatment Session  POD # 2 pm session.  Assisted pt out of recliner and attempted to amb to BR however c/o increased opain and fatigue.  Unable to make it so BSC brought to pt.    Follow Up Recommendations  SNF     Does the patient have the potential to tolerate intense rehabilitation     Barriers to Discharge        Equipment Recommendations  None recommended by PT    Recommendations for Other Services    Frequency 7X/week   Plan Discharge plan remains appropriate    Precautions / Restrictions Precautions Precautions: Fall;Knee Restrictions Weight Bearing Restrictions: No Other Position/Activity Restrictions: WBAT   Pertinent Vitals/Pain C/o 8/10 pain and MAX fatigue    Mobility   Transfers Transfers: Sit to Stand;Stand to Sit Sit to Stand: 3: Mod assist;4: Min assist;From toilet;From bed Stand to Sit: 3: Mod assist;4: Min assist;To toilet;To chair/3-in-1 Details for Transfer Assistance: cues for LE management and use of UEs to self assist Ambulation/Gait Ambulation/Gait Assistance: 4: Min assist;3: Mod assist Ambulation Distance (Feet): 4 Feet Assistive device: Rolling walker Ambulation/Gait Assistance Details: 25% VC's on proper sequencing, upright posture and proper walker to self distance. Gait Pattern: Step-to pattern;Decreased step length - right;Decreased stance time - right Gait velocity: decreased     PT Goals                                                     progressing    Visit Information  Last PT Received On: 11/02/12 Assistance Needed: +1    Subjective Data      Cognition       Balance     End of Session PT - End of Session Equipment Utilized During Treatment: Gait belt Activity Tolerance: Patient limited by  fatigue Patient left: in chair;with call bell/phone within reach Nurse Communication: Mobility status    Rica Koyanagi  PTA [facility]  Acute  Rehab  Pager      [phone]

## 2012-11-02 NOTE — Progress Notes (Signed)
Subjective: 2 Days Post-Op Procedure(s) (LRB): RIGHT TOTAL KNEE ARTHROPLASTY (Right) Patient reports pain as moderate.    Objective: Vital signs in last 24 hours: Temp:  [98.3 F (36.8 C)-98.7 F (37.1 C)] 98.3 F (36.8 C) (06/06 0518) Pulse Rate:  [86-101] 101 (06/06 0518) Resp:  [14-18] 18 (06/06 0518) BP: (155-163)/(61-83) 155/83 mmHg (06/06 0518) SpO2:  [93 %-97 %] 97 % (06/06 0518)  Intake/Output from previous day: 06/05 0701 - 06/06 0700 In: 380 [P.O.:240; I.V.:140] Out: 1260 [Urine:1250; Drains:10] Intake/Output this shift: Total I/O In: 240 [P.O.:240] Out: 750 [Urine:750]   Recent Labs  11/01/12 0438 11/02/12 0421  HGB 8.6* 8.4*    Recent Labs  11/01/12 0438 11/02/12 0421  WBC 7.6 10.2  RBC 3.22* 3.05*  HCT 27.3* 25.7*  PLT 287 277    Recent Labs  11/01/12 0438 11/02/12 0421  NA 132* 128*  K 3.8 3.4*  CL 94* 91*  CO2 28 26  BUN 14 11  CREATININE 1.10 1.02  GLUCOSE 142* 130*  CALCIUM 8.3* 8.1*   No results found for this basename: LABPT, INR,  in the last 72 hours Well-developed well-nourished patient in no acute distress. Alert and oriented x3 HEENT:within normal limits Cardiac: Regular rate and rhythm Pulmonary: Lungs clear to auscultation Abdomen: Soft and nontender.  Normal active bowel sounds Neurologically intact ABD soft Neurovascular intact Sensation intact distally Intact pulses distally No cellulitis present Compartment soft  Assessment/Plan: 2 Days Post-Op Procedure(s) (LRB): RIGHT TOTAL KNEE ARTHROPLASTY (Right) Hyponatremia stable and will continue to observe Acute blood loss anemia stable and will continue to observe Advance diet Up with therapy Discharge to SNF  Custer Pimenta L 11/02/2012, 2:01 PM

## 2012-11-02 NOTE — Progress Notes (Signed)
Physical Therapy Treatment Patient Details Name: Amanda Cain MRN: [id number] DOB: [birth year] Today's Date: 11/02/2012 Time: [id number] PT Time Calculation (min): 31 min  PT Assessment / Plan / Recommendation Comments on Treatment Session  POD # 2 R TKR am session.  Asssited pt OOB to BSC to void then amb in hallway.  performed TE's then applied ICE.    Follow Up Recommendations  SNF     Does the patient have the potential to tolerate intense rehabilitation     Barriers to Discharge        Equipment Recommendations  None recommended by PT    Recommendations for Other Services    Frequency 7X/week   Plan Discharge plan remains appropriate    Precautions / Restrictions Precautions Precautions: Fall;Knee Restrictions Weight Bearing Restrictions: No Other Position/Activity Restrictions: WBAT   Pertinent Vitals/Pain C/o 6/10 during TE's ICE applied    Mobility  Bed Mobility Bed Mobility: Sit to Supine Supine to Sit: 3: Mod assist Details for Bed Mobility Assistance: cues for use of L LE to self assist and increased time Transfers Transfers: Sit to Stand;Stand to Sit Sit to Stand: 3: Mod assist;4: Min assist;From toilet;From bed Stand to Sit: 3: Mod assist;4: Min assist;To toilet;To chair/3-in-1 Details for Transfer Assistance: cues for LE management and use of UEs to self assist Ambulation/Gait Ambulation/Gait Assistance: 4: Min assist;3: Mod assist Ambulation Distance (Feet): 45 Feet Assistive device: Rolling walker Ambulation/Gait Assistance Details: 25% VC's on proper sequencing, upright posture and proper walker to self distance. Gait Pattern: Step-to pattern;Decreased step length - right;Decreased stance time - right Gait velocity: decreased    Exercises   Total Knee Replacement TE's 10 reps B LE ankle pumps 10 reps knee presses 10 reps heel slides  10 reps SAQ's 10 reps SLR's 10 reps ABD Followed by ICE   PT Goals                                                             progressing    Visit Information  Last PT Received On: 11/02/12 Assistance Needed: +1    Subjective Data      Cognition       Balance     End of Session PT - End of Session Equipment Utilized During Treatment: Gait belt Activity Tolerance: Patient limited by fatigue Patient left: in chair;with call bell/phone within reach   Rica Koyanagi  PTA [facility]  Acute  Rehab Pager      [phone]

## 2012-11-02 NOTE — Discharge Summary (Signed)
Patient ID: Amanda Cain MRN: [id number] DOB/AGE: 69-Dec-1947 69 y.o.  Admit date: 10/31/2012 Discharge date: 11/02/2012  Admission Diagnoses:  Principal Problem:   Osteoarthritis of right knee Active Problems:   Abdominal pain   Hyponatremia   Postoperative anemia due to acute blood loss   Discharge Diagnoses:  Same  Past Medical History  Diagnosis Date  . GERD (gastroesophageal reflux disease)   . Depression   . Hypothyroidism   . Diabetes mellitus   . Anemia   . Hypertension   . Bronchitis     hx of  . Chronic kidney disease     "kidney disease stage 3"  . Arthritis   . Hypercholesteremia   . Anxiety     severe  . Hx of gallstones     Surgeries: Procedure(s): RIGHT TOTAL KNEE ARTHROPLASTY on 10/31/2012    Discharged Condition: Improved  Hospital Course: Amanda Cain is an 67 y.o. female who was admitted 10/31/2012 for operative treatment ofOsteoarthritis of right knee. Patient has severe unremitting pain that affects sleep, daily activities, and work/hobbies. After pre-op clearance the patient was taken to the operating room on 10/31/2012 and underwent  Procedure(s): RIGHT TOTAL KNEE ARTHROPLASTY.    Patient was given perioperative antibiotics:     Anti-infectives   Start     Dose/Rate Route Frequency Ordered Stop   10/31/12 1400  ceFAZolin (ANCEF) IVPB 2 g/50 mL premix     2 g 100 mL/hr over 30 Minutes Intravenous Every 6 hours 10/31/12 1237 10/31/12 2027   10/31/12 0714  ceFAZolin (ANCEF) IVPB 2 g/50 mL premix     2 g 100 mL/hr over 30 Minutes Intravenous On call to O.R. 10/31/12 [id number] 10/31/12 [id number]       Patient was given sequential compression devices, early ambulation, and chemoprophylaxis to prevent DVT.she did have acute blood loss anemia which was asymptomatic and treated with close monitoring and oral iron therapy.  She also had hyponatremia which was monitored carefully and she was fluid restricted when her IV was discontinued on postop day  #2.  Patient benefited maximally from hospital stay and there were no complications.    Recent vital signs:  Patient Vitals for the past 24 hrs:  BP Temp Temp src Pulse Resp SpO2  11/02/12 0518 155/83 mmHg 98.3 F (36.8 C) Oral 101 18 97 %  11/01/12 2056 162/81 mmHg 98.5 F (36.9 C) Oral 97 14 93 %     Recent laboratory studies:   Recent Labs  11/01/12 0438 11/02/12 0421  WBC 7.6 10.2  HGB 8.6* 8.4*  HCT 27.3* 25.7*  PLT 287 277  NA 132* 128*  K 3.8 3.4*  CL 94* 91*  CO2 28 26  BUN 14 11  CREATININE 1.10 1.02  GLUCOSE 142* 130*  CALCIUM 8.3* 8.1*     Discharge Medications:     Medication List    STOP taking these medications       INVEGA SUSTENNA 117 MG/0.75ML Susp  Generic drug:  Paliperidone Palmitate      TAKE these medications       atorvastatin 40 MG tablet  Commonly known as:  LIPITOR  Take 40 mg by mouth at bedtime.     Biotin 5000 MCG Caps  Take 5,000 mcg by mouth daily.     CALCIUM 1200 PO  Take 1,200 mg by mouth daily.     escitalopram 20 MG tablet  Commonly known as:  LEXAPRO  Take 20 mg by mouth at bedtime.  estradiol 0.5 MG tablet  Commonly known as:  ESTRACE  Take 0.5 mg by mouth daily.     ferrous sulfate 325 (65 FE) MG tablet  Take 325 mg by mouth daily with breakfast.     levothyroxine 125 MCG tablet  Commonly known as:  SYNTHROID, LEVOTHROID  Take 125 mcg by mouth daily before breakfast.     LORazepam 0.5 MG tablet  Commonly known as:  ATIVAN  Take 0.5 mg by mouth 2 (two) times daily.     metFORMIN 1000 MG tablet  Commonly known as:  GLUCOPHAGE  Take 1,000 mg by mouth 2 (two) times daily with a meal.     methocarbamol 500 MG tablet  Commonly known as:  ROBAXIN  Take 1 tablet (500 mg total) by mouth 3 (three) times daily. Prn spasm.     metoprolol succinate 25 MG 24 hr tablet  Commonly known as:  TOPROL-XL  Take 25 mg by mouth daily with breakfast.     Misc Intestinal Flora Regulat Caps  Take 1 capsule by  mouth 3 (three) times daily with meals.     multivitamin with minerals Tabs  Take 1 tablet by mouth daily.     olmesartan-hydrochlorothiazide 40-25 MG per tablet  Commonly known as:  BENICAR HCT  Take 1 tablet by mouth every morning.     oxyCODONE-acetaminophen 5-325 MG per tablet  Commonly known as:  ROXICET  Take 1-2 tablets by mouth every 4 (four) hours as needed for pain.     pantoprazole 40 MG tablet  Commonly known as:  PROTONIX  Take 1 tablet (40 mg total) by mouth 2 (two) times daily before a meal.     potassium chloride 10 MEQ tablet  Commonly known as:  K-DUR  Take 1 tablet (10 mEq total) by mouth daily.     rivaroxaban 10 MG Tabs tablet  Commonly known as:  XARELTO  Take 1 tablet (10 mg total) by mouth daily. Take x 1 month post op.     sucralfate 1 G tablet  Commonly known as:  CARAFATE  Take 1 tablet (1 g total) by mouth 4 (four) times daily -  with meals and at bedtime.     SYSTANE 0.4-0.3 % Soln  Generic drug:  Polyethyl Glycol-Propyl Glycol  Apply 1 drop to eye daily as needed (for dry eyes).     vitamin B-12 1000 MCG tablet  Commonly known as:  CYANOCOBALAMIN  Take 1,000 mcg by mouth daily.     vitamin C 500 MG tablet  Commonly known as:  ASCORBIC ACID  Take 500 mg by mouth daily.     vitamin E 400 UNIT capsule  Generic drug:  vitamin E  Take 400 Units by mouth daily.        Diagnostic Studies: Dg Chest 2 View  10/26/2012   *RADIOLOGY REPORT*  Clinical Data: Preoperative examination (Right total knee replacement)  CHEST - 2 VIEW  Comparison: 11/27/2011; 11/20/2011; 03/17/2011  Findings: Grossly unchanged borderline enlarged cardiac silhouette and mediastinal contours.  There is chronic mild elevation of the right hemidiaphragm.  No focal airspace opacities.  No pleural effusion or pneumothorax.  No definite evidence of edema.  No acute osseous abnormalities.  IMPRESSION: Borderline cardiomegaly without acute cardiopulmonary disease.   Original Report  Authenticated By: Jake Seats, MD    Disposition: SNF Discharge Orders   Future Orders Complete By Expires     CPM  As directed     Comments:  Continuous passive motion machine (CPM):      Use the CPM from 0 degrees to 60 degrees  For 6-8 hours per day.      You may increase by 10 degrees per day.  You may break it up into 2 or 3 sessions per day.      Use CPM for 2 weeks or until you are told to stop.    Diet Carb Modified  As directed     Do not put a pillow under the knee. Place it under the heel.  As directed     Increase activity slowly as tolerated  As directed     Weight bearing as tolerated  As directed        Follow-up Information   Follow up with GRAVES,JOHN L, MD. Schedule an appointment as soon as possible for a visit in 2 weeks.   Contact information:   [place] [address] [phone]        Signed: Erlene Senters 11/02/2012, 2:33 PM

## 2012-11-04 LAB — TYPE AND SCREEN
ABO/RH(D): O POS
Antibody Screen: POSITIVE
DAT, IgG: NEGATIVE
Unit division: 0
Unit division: 0

## 2012-11-05 ENCOUNTER — Other Ambulatory Visit: Payer: Self-pay | Admitting: *Deleted

## 2012-11-05 ENCOUNTER — Non-Acute Institutional Stay (SKILLED_NURSING_FACILITY): Payer: Medicare Other | Admitting: Adult Health

## 2012-11-05 DIAGNOSIS — K219 Gastro-esophageal reflux disease without esophagitis: Secondary | ICD-10-CM

## 2012-11-05 DIAGNOSIS — E871 Hypo-osmolality and hyponatremia: Secondary | ICD-10-CM

## 2012-11-05 DIAGNOSIS — IMO0002 Reserved for concepts with insufficient information to code with codable children: Secondary | ICD-10-CM

## 2012-11-05 DIAGNOSIS — E039 Hypothyroidism, unspecified: Secondary | ICD-10-CM

## 2012-11-05 DIAGNOSIS — M1710 Unilateral primary osteoarthritis, unspecified knee: Secondary | ICD-10-CM

## 2012-11-05 DIAGNOSIS — I10 Essential (primary) hypertension: Secondary | ICD-10-CM

## 2012-11-05 DIAGNOSIS — F32A Depression, unspecified: Secondary | ICD-10-CM

## 2012-11-05 DIAGNOSIS — D649 Anemia, unspecified: Secondary | ICD-10-CM

## 2012-11-05 DIAGNOSIS — F329 Major depressive disorder, single episode, unspecified: Secondary | ICD-10-CM

## 2012-11-05 DIAGNOSIS — E785 Hyperlipidemia, unspecified: Secondary | ICD-10-CM

## 2012-11-05 DIAGNOSIS — M1711 Unilateral primary osteoarthritis, right knee: Secondary | ICD-10-CM

## 2012-11-05 MED ORDER — OXYCODONE-ACETAMINOPHEN 5-325 MG PO TABS: ORAL_TABLET | ORAL | Status: DC

## 2012-11-07 ENCOUNTER — Other Ambulatory Visit (HOSPITAL_COMMUNITY): Payer: Self-pay

## 2012-11-08 ENCOUNTER — Ambulatory Visit (HOSPITAL_COMMUNITY)
Admission: RE | Admit: 2012-11-08 | Discharge: 2012-11-08 | Disposition: A | Discharge status: Home / Self Care | Payer: Medicare Other | Source: Ambulatory Visit | Attending: Internal Medicine | Admitting: Internal Medicine

## 2012-11-08 DIAGNOSIS — E119 Type 2 diabetes mellitus without complications: Secondary | ICD-10-CM | POA: Insufficient documentation

## 2012-11-08 DIAGNOSIS — I2510 Atherosclerotic heart disease of native coronary artery without angina pectoris: Secondary | ICD-10-CM | POA: Insufficient documentation

## 2012-11-08 DIAGNOSIS — Z96659 Presence of unspecified artificial knee joint: Secondary | ICD-10-CM | POA: Insufficient documentation

## 2012-11-08 DIAGNOSIS — D649 Anemia, unspecified: Secondary | ICD-10-CM | POA: Insufficient documentation

## 2012-11-08 LAB — PREPARE RBC (CROSSMATCH)

## 2012-11-08 MED ORDER — SODIUM CHLORIDE 0.9 % IV SOLN: INTRAVENOUS | Status: DC

## 2012-11-09 ENCOUNTER — Encounter (HOSPITAL_COMMUNITY)
Admission: RE | Admit: 2012-11-09 | Discharge: 2012-11-09 | Disposition: A | Discharge status: Home / Self Care | Payer: Medicare Other | Source: Ambulatory Visit | Attending: Internal Medicine | Admitting: Internal Medicine

## 2012-11-09 DIAGNOSIS — D649 Anemia, unspecified: Secondary | ICD-10-CM | POA: Insufficient documentation

## 2012-11-10 LAB — TYPE AND SCREEN
ABO/RH(D): O POS
Antibody Screen: POSITIVE
DAT, IgG: NEGATIVE
PT AG Type: NEGATIVE
Unit division: 0
Unit division: 0

## 2012-11-15 ENCOUNTER — Non-Acute Institutional Stay (SKILLED_NURSING_FACILITY): Payer: Medicare Other | Admitting: Internal Medicine

## 2012-11-15 DIAGNOSIS — M1711 Unilateral primary osteoarthritis, right knee: Secondary | ICD-10-CM

## 2012-11-15 DIAGNOSIS — E039 Hypothyroidism, unspecified: Secondary | ICD-10-CM

## 2012-11-15 DIAGNOSIS — I10 Essential (primary) hypertension: Secondary | ICD-10-CM

## 2012-11-15 DIAGNOSIS — IMO0002 Reserved for concepts with insufficient information to code with codable children: Secondary | ICD-10-CM

## 2012-11-15 DIAGNOSIS — D62 Acute posthemorrhagic anemia: Secondary | ICD-10-CM

## 2012-11-15 DIAGNOSIS — M1710 Unilateral primary osteoarthritis, unspecified knee: Secondary | ICD-10-CM

## 2012-11-16 ENCOUNTER — Non-Acute Institutional Stay (SKILLED_NURSING_FACILITY): Payer: Medicare Other | Admitting: Adult Health

## 2012-11-16 DIAGNOSIS — I10 Essential (primary) hypertension: Secondary | ICD-10-CM

## 2012-11-16 DIAGNOSIS — IMO0002 Reserved for concepts with insufficient information to code with codable children: Secondary | ICD-10-CM

## 2012-11-16 DIAGNOSIS — F32A Depression, unspecified: Secondary | ICD-10-CM

## 2012-11-16 DIAGNOSIS — M1710 Unilateral primary osteoarthritis, unspecified knee: Secondary | ICD-10-CM

## 2012-11-16 DIAGNOSIS — E871 Hypo-osmolality and hyponatremia: Secondary | ICD-10-CM

## 2012-11-16 DIAGNOSIS — E039 Hypothyroidism, unspecified: Secondary | ICD-10-CM

## 2012-11-16 DIAGNOSIS — F329 Major depressive disorder, single episode, unspecified: Secondary | ICD-10-CM

## 2012-11-16 DIAGNOSIS — D649 Anemia, unspecified: Secondary | ICD-10-CM

## 2012-11-16 DIAGNOSIS — K219 Gastro-esophageal reflux disease without esophagitis: Secondary | ICD-10-CM

## 2012-11-16 DIAGNOSIS — M1711 Unilateral primary osteoarthritis, right knee: Secondary | ICD-10-CM

## 2012-11-16 DIAGNOSIS — E785 Hyperlipidemia, unspecified: Secondary | ICD-10-CM

## 2012-11-19 DIAGNOSIS — E119 Type 2 diabetes mellitus without complications: Secondary | ICD-10-CM

## 2012-11-19 DIAGNOSIS — Z471 Aftercare following joint replacement surgery: Secondary | ICD-10-CM

## 2012-11-19 DIAGNOSIS — I129 Hypertensive chronic kidney disease with stage 1 through stage 4 chronic kidney disease, or unspecified chronic kidney disease: Secondary | ICD-10-CM

## 2012-11-19 DIAGNOSIS — I2510 Atherosclerotic heart disease of native coronary artery without angina pectoris: Secondary | ICD-10-CM

## 2012-11-25 ENCOUNTER — Encounter: Payer: Self-pay | Admitting: Adult Health

## 2012-11-25 DIAGNOSIS — K219 Gastro-esophageal reflux disease without esophagitis: Secondary | ICD-10-CM | POA: Insufficient documentation

## 2012-11-25 DIAGNOSIS — E785 Hyperlipidemia, unspecified: Secondary | ICD-10-CM | POA: Insufficient documentation

## 2012-11-25 DIAGNOSIS — F329 Major depressive disorder, single episode, unspecified: Secondary | ICD-10-CM | POA: Insufficient documentation

## 2012-11-25 DIAGNOSIS — F32A Depression, unspecified: Secondary | ICD-10-CM | POA: Insufficient documentation

## 2012-11-25 NOTE — Progress Notes (Signed)
  Subjective:    Patient ID: Amanda Cain, female    DOB: 1945/10/23, 67 y.o.   MRN: [id number]  HPI  This is a 67 year old female who is for discharge home with Home health PT, Nursing and Home health Aide. She has been admitted to [facility] on 11/02/12 from [facility]  With End-stage arthritis S/P right total knee arthroplasty. She has been admitted for a short-term rehabilitation. She has completed SNF rehabilitation and therapy has cleared the patient for discharge.   Review of Systems  Constitutional: Negative.   HENT: Negative.   Eyes: Negative.   Respiratory: Negative for shortness of breath and wheezing.   Cardiovascular: Negative for leg swelling.  Gastrointestinal: Negative.   Endocrine: Negative.   Genitourinary: Negative.   Neurological: Negative.   Hematological: Negative for adenopathy. Does not bruise/bleed easily.  Psychiatric/Behavioral: Negative.        Objective:   Physical Exam  Nursing note and vitals reviewed. Constitutional: She is oriented to person, place, and time. She appears well-developed and well-nourished.  HENT:  Head: Normocephalic and atraumatic.  Right Ear: External ear normal.  Left Ear: External ear normal.  Nose: Nose normal.  Mouth/Throat: Oropharynx is clear and moist.  Eyes: Conjunctivae and EOM are normal. Pupils are equal, round, and reactive to light.  Neck: Normal range of motion. Neck supple. No thyromegaly present.  Cardiovascular: Normal rate, regular rhythm, normal heart sounds and intact distal pulses.   Pulmonary/Chest: Effort normal and breath sounds normal. No respiratory distress.  Abdominal: Soft. Bowel sounds are normal.  Musculoskeletal: Normal range of motion. She exhibits no edema and no tenderness.  Neurological: She is alert and oriented to person, place, and time.  Skin: Skin is warm and dry.  Psychiatric: She has a normal mood and affect. Her behavior is normal. Judgment and thought content normal.     LABS: 11/06/12  Wbc 6.3  hgb 6.6  hct 19.8 11/05/12  Wbc 7.5  hgb 6.7  hct 20.5  NA 132  K 3.8  Glucose 105  BUN 30  Creatinine 2.21   Calcium 7.8  hgbA1c 6.1    Medications reviewed per [facility]      Assessment & Plan:   Hyperlipidemia - stable  Depression -stable  GERD (gastroesophageal reflux disease) - stable  Postoperative anemia due to acute blood loss -  Stable; S/P blood transfusion  Osteoarthritis of right knee S/P right total knee arthroplasty - for Home health PT, Nursing and Home health Aide  Hyponatremia - stable  Diabetes mellitus - well-controlled  HTN (hypertension) - well-controlled  Hypothyroidism - stable    Total discharge time: > 30 mins Discharge time involved coordination of the discharge process with Education officer, museum, nursing staff and therapy department. Medical justification for Home health services verified.    CPT CODE:  59563

## 2012-11-25 NOTE — Progress Notes (Signed)
  Subjective:    Patient ID: Amanda Cain, female    DOB: [birth year], 67 y.o.   MRN: [id number]  HPI This is a 67 year old female who was noted to have hgb 6.7. Latest hgbA1C 6.1 and creatinine 2.21 and currently taking Metformin.She reports feeling tired. She has been admitted to [facility] on 11/02/12 from [facility]  With End-stage arthritis S/P right total knee arthroplasty. She has been admitted for a short-term rehabilitation.   Review of Systems  Constitutional: Positive for fatigue.  HENT: Negative.   Eyes: Negative.   Respiratory: Negative for shortness of breath and wheezing.   Cardiovascular: Negative for leg swelling.  Gastrointestinal: Negative.   Endocrine: Negative.   Genitourinary: Negative.   Neurological: Negative.   Hematological: Negative for adenopathy. Does not bruise/bleed easily.  Psychiatric/Behavioral: Negative.        Objective:   Physical Exam  Nursing note and vitals reviewed. Constitutional: She is oriented to person, place, and time. She appears well-developed and well-nourished.  HENT:  Head: Normocephalic and atraumatic.  Right Ear: External ear normal.  Left Ear: External ear normal.  Nose: Nose normal.  Mouth/Throat: Oropharynx is clear and moist.  Eyes: Conjunctivae and EOM are normal. Pupils are equal, round, and reactive to light.  Neck: Normal range of motion. Neck supple. No thyromegaly present.  Cardiovascular: Normal rate, regular rhythm, normal heart sounds and intact distal pulses.   Pulmonary/Chest: Effort normal and breath sounds normal. No respiratory distress.  Abdominal: Soft. Bowel sounds are normal.  Musculoskeletal: She exhibits no edema and no tenderness.  Neurological: She is alert and oriented to person, place, and time.  Skin: Skin is warm and dry.  Psychiatric: She has a normal mood and affect. Her behavior is normal. Judgment and thought content normal.    LABS: 11/05/12  Wbc 7.5  hgb 6.7  hct 20.5  NA  132  K 3.8  Glucose 105  BUN 30  Creatinine 2.21   Calcium 7.8  hgbA1c 6.1    Medications reviewed per [facility]      Assessment & Plan:   Hyperlipidemia - stable  Depression -stable  GERD (gastroesophageal reflux disease) - stable  Postoperative anemia due to acute blood loss -  Increase FeSO4 325 mg 1 tab PO BID; repeat CBC in AM  Osteoarthritis of right knee S/P right total knee arthroplasty - for OT and PT  Hyponatremia - repeat BMP in AM; decrease Hyzaar 100/12.5 mg PO Q D  Diabetes mellitus - discontinue Metformin; start Humalog 5 units SQ TIDac for CBG >=150  HTN (hypertension) -  Increase Toprol XL 50 mg 1 tab PO Q D; check BP/HR Q shift x 1 week  Hypothyroidism - stable
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
## 2013-01-02 ENCOUNTER — Ambulatory Visit
Admission: RE | Admit: 2013-01-02 | Discharge: 2013-01-02 | Disposition: A | Discharge status: Home / Self Care | Payer: Medicare Other | Source: Ambulatory Visit | Attending: Family Medicine | Admitting: Family Medicine

## 2013-01-02 DIAGNOSIS — Z1231 Encounter for screening mammogram for malignant neoplasm of breast: Secondary | ICD-10-CM

## 2013-05-02 ENCOUNTER — Ambulatory Visit: Payer: Medicare Other | Admitting: Podiatry

## 2013-05-20 ENCOUNTER — Encounter: Payer: Self-pay | Admitting: Podiatry

## 2013-05-20 ENCOUNTER — Ambulatory Visit (INDEPENDENT_AMBULATORY_CARE_PROVIDER_SITE_OTHER): Payer: Medicare Other | Admitting: Podiatry

## 2013-05-20 VITALS — BP 169/82 | HR 74 | Resp 16

## 2013-05-20 DIAGNOSIS — B351 Tinea unguium: Secondary | ICD-10-CM

## 2013-05-20 DIAGNOSIS — M79609 Pain in unspecified limb: Secondary | ICD-10-CM

## 2013-05-20 NOTE — Patient Instructions (Signed)
Diabetes and Foot Care Diabetes may cause you to have problems because of poor blood supply (circulation) to your feet and legs. This may cause the skin on your feet to become thinner, break easier, and heal more slowly. Your skin may become dry, and the skin may peel and crack. You may also have nerve damage in your legs and feet causing decreased feeling in them. You may not notice minor injuries to your feet that could lead to infections or more serious problems. Taking care of your feet is one of the most important things you can do for yourself.  HOME CARE INSTRUCTIONS  Wear shoes at all times, even in the house. Do not go barefoot. Bare feet are easily injured.  Check your feet daily for blisters, cuts, and redness. If you cannot see the bottom of your feet, use a mirror or ask someone for help.  Wash your feet with warm water (do not use hot water) and mild soap. Then pat your feet and the areas between your toes until they are completely dry. Do not soak your feet as this can dry your skin.  Apply a moisturizing lotion or petroleum jelly (that does not contain alcohol and is unscented) to the skin on your feet and to dry, brittle toenails. Do not apply lotion between your toes.  Trim your toenails straight across. Do not dig under them or around the cuticle. File the edges of your nails with an emery board or nail file.  Do not cut corns or calluses or try to remove them with medicine.  Wear clean socks or stockings every day. Make sure they are not too tight. Do not wear knee-high stockings since they may decrease blood flow to your legs.  Wear shoes that fit properly and have enough cushioning. To break in new shoes, wear them for just a few hours a day. This prevents you from injuring your feet. Always look in your shoes before you put them on to be sure there are no objects inside.  Do not cross your legs. This may decrease the blood flow to your feet.  If you find a minor scrape,  cut, or break in the skin on your feet, keep it and the skin around it clean and dry. These areas may be cleansed with mild soap and water. Do not cleanse the area with peroxide, alcohol, or iodine.  When you remove an adhesive bandage, be sure not to damage the skin around it.  If you have a wound, look at it several times a day to make sure it is healing.  Do not use heating pads or hot water bottles. They may burn your skin. If you have lost feeling in your feet or legs, you may not know it is happening until it is too late.  Make sure your health care provider performs a complete foot exam at least annually or more often if you have foot problems. Report any cuts, sores, or bruises to your health care provider immediately. SEEK MEDICAL CARE IF:   You have an injury that is not healing.  You have cuts or breaks in the skin.  You have an ingrown nail.  You notice redness on your legs or feet.  You feel burning or tingling in your legs or feet.  You have pain or cramps in your legs and feet.  Your legs or feet are numb.  Your feet always feel cold. SEEK IMMEDIATE MEDICAL CARE IF:   There is increasing redness,   swelling, or pain in or around a wound.  There is a red line that goes up your leg.  Pus is coming from a wound.  You develop a fever or as directed by your health care provider.  You notice a bad smell coming from an ulcer or wound. Document Released: 05/13/2000 Document Revised: 01/16/2013 Document Reviewed: 10/23/2012 ExitCare Patient Information 2014 [employer].  

## 2013-05-31 NOTE — Progress Notes (Signed)
Subjective:     Patient ID: Amanda Cain, female   DOB: [birth year], 68 y.o.   MRN: [id number]  HPI patient presents with painful thick nails that she cannot take care of herself and are impossible for patient to trim   Review of Systems     Objective:   Physical Exam Neurovascular    unchanged with thick nails that are painful when pressed Assessment:     Mycotic nail infection with pain 1-5 both feet    Plan:     Debridement of painful nailbeds 1-5 both feet with no iatrogenic bleeding noted

## 2013-09-06 ENCOUNTER — Other Ambulatory Visit: Payer: Self-pay | Admitting: Family Medicine

## 2013-09-06 DIAGNOSIS — N179 Acute kidney failure, unspecified: Secondary | ICD-10-CM

## 2013-09-10 ENCOUNTER — Ambulatory Visit
Admission: RE | Admit: 2013-09-10 | Discharge: 2013-09-10 | Disposition: A | Discharge status: Home / Self Care | Payer: Medicare Other | Source: Ambulatory Visit | Attending: Family Medicine | Admitting: Family Medicine

## 2013-09-10 DIAGNOSIS — N179 Acute kidney failure, unspecified: Secondary | ICD-10-CM

## 2014-01-02 ENCOUNTER — Ambulatory Visit: Payer: Medicare Other | Admitting: Podiatry

## 2014-01-06 ENCOUNTER — Ambulatory Visit (INDEPENDENT_AMBULATORY_CARE_PROVIDER_SITE_OTHER): Payer: Medicare Other | Admitting: Podiatry

## 2014-01-06 DIAGNOSIS — M79673 Pain in unspecified foot: Secondary | ICD-10-CM

## 2014-01-06 DIAGNOSIS — B351 Tinea unguium: Secondary | ICD-10-CM

## 2014-01-06 DIAGNOSIS — M79609 Pain in unspecified limb: Secondary | ICD-10-CM

## 2014-01-06 NOTE — Progress Notes (Signed)
   Subjective:    Patient ID: Amanda Cain, female    DOB: [birth year], 68 y.o.   MRN: [id number]  HPI  Pt presents for debridement of nails  Review of Systems     Objective:   Physical Exam        Assessment & Plan:

## 2014-01-06 NOTE — Progress Notes (Signed)
Subjective:     Patient ID: Amanda Cain, female   DOB: 22-Nov-1945, 68 y.o.   MRN: [id number]  HPI patient presents with thick nail disease 1-5 both feet that are painful when pressed and she cannot cut   Review of Systems     Objective:   Physical Exam Neurovascular status intact with muscle strength adequate and range of motion subtalar midtarsal joint within normal limits. Patient's found to have thick yellow brittle nailbeds 1-5 both feet    Assessment:     Mycotic nail infection is with pain 1-5 both feet    Plan:     Debris painful nailbeds 1-5 both feet with no iatrogenic bleeding noted

## 2014-01-16 ENCOUNTER — Other Ambulatory Visit: Payer: Self-pay

## 2014-01-16 DIAGNOSIS — Z1231 Encounter for screening mammogram for malignant neoplasm of breast: Secondary | ICD-10-CM

## 2014-01-28 ENCOUNTER — Ambulatory Visit
Admission: RE | Admit: 2014-01-28 | Discharge: 2014-01-28 | Disposition: A | Discharge status: Home / Self Care | Payer: Medicare Other | Source: Ambulatory Visit

## 2014-01-28 DIAGNOSIS — Z1231 Encounter for screening mammogram for malignant neoplasm of breast: Secondary | ICD-10-CM

## 2014-04-14 ENCOUNTER — Ambulatory Visit (INDEPENDENT_AMBULATORY_CARE_PROVIDER_SITE_OTHER): Payer: Medicare Other | Admitting: Podiatry

## 2014-04-14 DIAGNOSIS — M79673 Pain in unspecified foot: Secondary | ICD-10-CM

## 2014-04-14 DIAGNOSIS — B351 Tinea unguium: Secondary | ICD-10-CM

## 2014-04-14 NOTE — Progress Notes (Signed)
Subjective:     Patient ID: Amanda Cain, female   DOB: 1945-07-04, 68 y.o.   MRN: [id number]  HPI patient presents with thick nail disease 1-5 both feet that are painful when pressed and she cannot cut   Review of Systems     Objective:   Physical Exam Neurovascular status intact with muscle strength adequate and range of motion subtalar midtarsal joint within normal limits. Patient's found to have thick yellow brittle nailbeds 1-5 both feet    Assessment:     Mycotic nail infection is with pain 1-5 both feet    Plan:     Debris painful nailbeds 1-5 both feet with no iatrogenic bleeding noted

## 2014-07-07 ENCOUNTER — Encounter: Payer: Medicaid Other | Admitting: Podiatry

## 2014-07-07 ENCOUNTER — Ambulatory Visit: Payer: Medicaid Other | Admitting: Podiatry

## 2014-07-07 NOTE — Progress Notes (Signed)
Subjective:     Patient ID: Amanda Cain, female   DOB: [birth year], 69 y.o.   MRN: [id number]  HPI patient presents with thick nail disease 1-5 both feet that are painful when pressed and she cannot cut   Review of Systems     Objective:   Physical Exam Neurovascular status intact with muscle strength adequate and range of motion subtalar midtarsal joint within normal limits. Patient's found to have thick yellow brittle nailbeds 1-5 both feet    Assessment:     Mycotic nail infection is with pain 1-5 both feet    Plan:     Debris painful nailbeds 1-5 both feet with no iatrogenic bleeding noted

## 2014-07-21 ENCOUNTER — Ambulatory Visit
Admission: RE | Admit: 2014-07-21 | Discharge: 2014-07-21 | Disposition: A | Discharge status: Home / Self Care | Payer: Medicare Other | Source: Ambulatory Visit | Attending: Family Medicine | Admitting: Family Medicine

## 2014-07-21 ENCOUNTER — Other Ambulatory Visit: Payer: Self-pay | Admitting: Family Medicine

## 2014-07-21 DIAGNOSIS — R059 Cough, unspecified: Secondary | ICD-10-CM

## 2014-07-21 DIAGNOSIS — R05 Cough: Secondary | ICD-10-CM

## 2014-08-06 ENCOUNTER — Other Ambulatory Visit: Payer: Self-pay | Admitting: Gastroenterology

## 2014-08-06 DIAGNOSIS — R1084 Generalized abdominal pain: Secondary | ICD-10-CM

## 2014-08-12 ENCOUNTER — Ambulatory Visit (HOSPITAL_COMMUNITY)
Admission: RE | Admit: 2014-08-12 | Discharge: 2014-08-12 | Disposition: A | Discharge status: Home / Self Care | Payer: Medicare Other | Source: Ambulatory Visit | Attending: Gastroenterology | Admitting: Gastroenterology

## 2014-08-12 DIAGNOSIS — K760 Fatty (change of) liver, not elsewhere classified: Secondary | ICD-10-CM | POA: Diagnosis not present

## 2014-08-12 DIAGNOSIS — K8020 Calculus of gallbladder without cholecystitis without obstruction: Secondary | ICD-10-CM | POA: Insufficient documentation

## 2014-08-12 DIAGNOSIS — R1084 Generalized abdominal pain: Secondary | ICD-10-CM | POA: Diagnosis present

## 2014-08-19 ENCOUNTER — Other Ambulatory Visit: Payer: Self-pay | Admitting: Gastroenterology

## 2014-08-20 ENCOUNTER — Ambulatory Visit (HOSPITAL_COMMUNITY)
Admission: RE | Admit: 2014-08-20 | Discharge: 2014-08-20 | Disposition: A | Discharge status: Home / Self Care | Payer: Medicare Other | Source: Ambulatory Visit | Attending: Gastroenterology | Admitting: Gastroenterology

## 2014-08-20 ENCOUNTER — Encounter (HOSPITAL_COMMUNITY): Payer: Self-pay | Admitting: *Deleted

## 2014-08-20 ENCOUNTER — Encounter (HOSPITAL_COMMUNITY)
Admission: RE | Disposition: A | Discharge status: Home / Self Care | Payer: Self-pay | Source: Ambulatory Visit | Attending: Gastroenterology

## 2014-08-20 DIAGNOSIS — F419 Anxiety disorder, unspecified: Secondary | ICD-10-CM | POA: Insufficient documentation

## 2014-08-20 DIAGNOSIS — Z90710 Acquired absence of both cervix and uterus: Secondary | ICD-10-CM | POA: Diagnosis not present

## 2014-08-20 DIAGNOSIS — E039 Hypothyroidism, unspecified: Secondary | ICD-10-CM | POA: Insufficient documentation

## 2014-08-20 DIAGNOSIS — F329 Major depressive disorder, single episode, unspecified: Secondary | ICD-10-CM | POA: Diagnosis not present

## 2014-08-20 DIAGNOSIS — Z881 Allergy status to other antibiotic agents status: Secondary | ICD-10-CM | POA: Insufficient documentation

## 2014-08-20 DIAGNOSIS — D649 Anemia, unspecified: Secondary | ICD-10-CM | POA: Diagnosis not present

## 2014-08-20 DIAGNOSIS — N183 Chronic kidney disease, stage 3 (moderate): Secondary | ICD-10-CM | POA: Diagnosis not present

## 2014-08-20 DIAGNOSIS — E119 Type 2 diabetes mellitus without complications: Secondary | ICD-10-CM | POA: Insufficient documentation

## 2014-08-20 DIAGNOSIS — I129 Hypertensive chronic kidney disease with stage 1 through stage 4 chronic kidney disease, or unspecified chronic kidney disease: Secondary | ICD-10-CM | POA: Diagnosis not present

## 2014-08-20 DIAGNOSIS — K219 Gastro-esophageal reflux disease without esophagitis: Secondary | ICD-10-CM | POA: Insufficient documentation

## 2014-08-20 DIAGNOSIS — R1011 Right upper quadrant pain: Secondary | ICD-10-CM | POA: Diagnosis present

## 2014-08-20 HISTORY — PX: ESOPHAGOGASTRODUODENOSCOPY: SHX5428

## 2014-08-20 LAB — GLUCOSE, CAPILLARY: Glucose-Capillary: 177 mg/dL — ABNORMAL HIGH (ref 70–99)

## 2014-08-20 SURGERY — EGD (ESOPHAGOGASTRODUODENOSCOPY)
Anesthesia: Moderate Sedation

## 2014-08-20 MED ORDER — MIDAZOLAM HCL 10 MG/2ML IJ SOLN
INTRAMUSCULAR | Status: AC
  Filled 2014-08-20: qty 2

## 2014-08-20 MED ORDER — FENTANYL CITRATE 0.05 MG/ML IJ SOLN
INTRAMUSCULAR | Status: AC
  Filled 2014-08-20: qty 2

## 2014-08-20 MED ORDER — MIDAZOLAM HCL 10 MG/2ML IJ SOLN
INTRAMUSCULAR | Status: DC | PRN
  Administered 2014-08-20 (×2): 2 mg via INTRAVENOUS
  Administered 2014-08-20: 1 mg via INTRAVENOUS

## 2014-08-20 MED ORDER — DIPHENHYDRAMINE HCL 50 MG/ML IJ SOLN
INTRAMUSCULAR | Status: AC
  Filled 2014-08-20: qty 1

## 2014-08-20 MED ORDER — SODIUM CHLORIDE 0.9 % IV SOLN
INTRAVENOUS | Status: DC
  Administered 2014-08-20: 500 mL via INTRAVENOUS

## 2014-08-20 MED ORDER — FENTANYL CITRATE 0.05 MG/ML IJ SOLN
INTRAMUSCULAR | Status: DC | PRN
  Administered 2014-08-20 (×2): 25 ug via INTRAVENOUS

## 2014-08-20 NOTE — Op Note (Signed)
[facility] [place], [address]   ENDOSCOPY PROCEDURE REPORT  PATIENT: Amanda Cain, Amanda Cain  MR#: [id number] BIRTHDATE: [birth year] , 68  yrs. old GENDER: female ENDOSCOPIST:Jamespaul Secrist Benson Norway, MD REFERRED BY: PROCEDURE DATE:  09-13-14 PROCEDURE:   EGD, diagnostic ASA CLASS:    Class III INDICATIONS: RUQ pain MEDICATION: Versed 5 mg IV and Fentanyl 50 mcg IV TOPICAL ANESTHETIC:   none  DESCRIPTION OF PROCEDURE:   After the risks and benefits of the procedure were explained, informed consent was obtained.  The Pentax Gastroscope [id number]  endoscope was introduced through the mouth  and advanced to the second portion of the duodenum .  The instrument was slowly withdrawn as the mucosa was fully examined. Estimated blood loss is zero unless otherwise noted in this procedure report.    FINDINGS: The esophagus was normal.  The Z-line was sharp.  There is most likely a sliding hiatal hernia, but it was difficult to discern during this examination.  No evidence of any inflammation, ulcerations, erosions, polyps, or masses in the gastric or duodenal lumens.    Retroflexed views revealed a possible hiatal hernia. The scope was then withdrawn from the patient and the procedure completed.  COMPLICATIONS: There were no immediate complications.  ENDOSCOPIC IMPRESSION: 1) Possible sliding hiatal hernia.  RECOMMENDATIONS: 1) Surgical consultation for possible symptomatic cholelithiasis.   _______________________________ eSignedCarol Ada, MD 09/13/14 8:24 AM     cc:  CPT CODES: ICD CODES:  The ICD and CPT codes recommended by this software are interpretations from the data that the clinical staff has captured with the software.  The verification of the translation of this report to the ICD and CPT codes and modifiers is the sole responsibility of the health care institution and practicing physician where this report was generated.  [employer] will not be held responsible for the validity of the ICD and CPT codes included on this report.  AMA assumes no liability for data contained or not contained herein. CPT is a Designer, television/film set of the [employer].
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
## 2014-08-20 NOTE — Discharge Instructions (Signed)
Esophagogastroduodenoscopy °Care After °Refer to this sheet in the next few weeks. These instructions provide you with information on caring for yourself after your procedure. Your caregiver may also give you more specific instructions. Your treatment has been planned according to current medical practices, but problems sometimes occur. Call your caregiver if you have any problems or questions after your procedure.  °HOME CARE INSTRUCTIONS °· Do not eat or drink anything until the numbing medicine (local anesthetic) has worn off and your gag reflex has returned. You will know that the local anesthetic has worn off when you can swallow comfortably. °· Do not drive for 12 hours after the procedure or as directed by your caregiver. °· Only take medicines as directed by your caregiver. °SEEK MEDICAL CARE IF:  °· You cannot stop coughing. °· You are not urinating at all or less than usual. °SEEK IMMEDIATE MEDICAL CARE IF: °· You have difficulty swallowing. °· You cannot eat or drink. °· You have worsening throat or chest pain. °· You have dizziness, lightheadedness, or you faint. °· You have nausea or vomiting. °· You have chills. °· You have a fever. °· You have severe abdominal pain. °· You have black, tarry, or bloody stools. °Document Released: 05/02/2012 Document Reviewed: 05/02/2012 °ExitCare® Patient Information ©2015 Exit[employer]. This information is not intended to replace advice given to you by your health care provider. Make sure you discuss any questions you have with your health care provider. ° °

## 2014-08-21 ENCOUNTER — Encounter (HOSPITAL_COMMUNITY): Payer: Self-pay | Admitting: Gastroenterology

## 2014-08-25 ENCOUNTER — Ambulatory Visit (INDEPENDENT_AMBULATORY_CARE_PROVIDER_SITE_OTHER): Payer: Medicare Other | Admitting: Podiatry

## 2014-08-25 DIAGNOSIS — B351 Tinea unguium: Secondary | ICD-10-CM

## 2014-08-25 DIAGNOSIS — L309 Dermatitis, unspecified: Secondary | ICD-10-CM

## 2014-08-25 DIAGNOSIS — M79673 Pain in unspecified foot: Secondary | ICD-10-CM

## 2014-08-25 NOTE — Progress Notes (Signed)
Subjective:     Patient ID: Amanda Cain, female   DOB: 1946-02-21, 69 y.o.   MRN: [id number]  HPI patient presents with thick nail disease 1-5 both feet that are painful when pressed and she cannot cut   Review of Systems     Objective:   Physical Exam Neurovascular status intact with muscle strength adequate and range of motion subtalar midtarsal joint within normal limits. Patient's found to have thick yellow brittle nailbeds 1-5 both feet    Assessment:     Mycotic nail infection is with pain 1-5 both feet    Plan:     Debris painful nailbeds 1-5 both feet with no iatrogenic bleeding noted

## 2014-08-25 NOTE — Patient Instructions (Signed)
Diabetes and Foot Care Diabetes may cause you to have problems because of poor blood supply (circulation) to your feet and legs. This may cause the skin on your feet to become thinner, break easier, and heal more slowly. Your skin may become dry, and the skin may peel and crack. You may also have nerve damage in your legs and feet causing decreased feeling in them. You may not notice minor injuries to your feet that could lead to infections or more serious problems. Taking care of your feet is one of the most important things you can do for yourself.  HOME CARE INSTRUCTIONS  Wear shoes at all times, even in the house. Do not go barefoot. Bare feet are easily injured.  Check your feet daily for blisters, cuts, and redness. If you cannot see the bottom of your feet, use a mirror or ask someone for help.  Wash your feet with warm water (do not use hot water) and mild soap. Then pat your feet and the areas between your toes until they are completely dry. Do not soak your feet as this can dry your skin.  Apply a moisturizing lotion or petroleum jelly (that does not contain alcohol and is unscented) to the skin on your feet and to dry, brittle toenails. Do not apply lotion between your toes.  Trim your toenails straight across. Do not dig under them or around the cuticle. File the edges of your nails with an emery board or nail file.  Do not cut corns or calluses or try to remove them with medicine.  Wear clean socks or stockings every day. Make sure they are not too tight. Do not wear knee-high stockings since they may decrease blood flow to your legs.  Wear shoes that fit properly and have enough cushioning. To break in new shoes, wear them for just a few hours a day. This prevents you from injuring your feet. Always look in your shoes before you put them on to be sure there are no objects inside.  Do not cross your legs. This may decrease the blood flow to your feet.  If you find a minor scrape,  cut, or break in the skin on your feet, keep it and the skin around it clean and dry. These areas may be cleansed with mild soap and water. Do not cleanse the area with peroxide, alcohol, or iodine.  When you remove an adhesive bandage, be sure not to damage the skin around it.  If you have a wound, look at it several times a day to make sure it is healing.  Do not use heating pads or hot water bottles. They may burn your skin. If you have lost feeling in your feet or legs, you may not know it is happening until it is too late.  Make sure your health care provider performs a complete foot exam at least annually or more often if you have foot problems. Report any cuts, sores, or bruises to your health care provider immediately. SEEK MEDICAL CARE IF:   You have an injury that is not healing.  You have cuts or breaks in the skin.  You have an ingrown nail.  You notice redness on your legs or feet.  You feel burning or tingling in your legs or feet.  You have pain or cramps in your legs and feet.  Your legs or feet are numb.  Your feet always feel cold. SEEK IMMEDIATE MEDICAL CARE IF:   There is increasing redness,   swelling, or pain in or around a wound.  There is a red line that goes up your leg.  Pus is coming from a wound.  You develop a fever or as directed by your health care provider.  You notice a bad smell coming from an ulcer or wound. Document Released: 05/13/2000 Document Revised: 01/16/2013 Document Reviewed: 10/23/2012 ExitCare Patient Information 2015 [employer]. This information is not intended to replace advice given to you by your health care provider. Make sure you discuss any questions you have with your health care provider.  

## 2014-09-10 ENCOUNTER — Other Ambulatory Visit: Payer: Self-pay

## 2014-09-10 DIAGNOSIS — R109 Unspecified abdominal pain: Secondary | ICD-10-CM

## 2014-09-24 ENCOUNTER — Ambulatory Visit (HOSPITAL_COMMUNITY)
Admission: RE | Admit: 2014-09-24 | Discharge: 2014-09-24 | Disposition: A | Discharge status: Home / Self Care | Payer: Medicare Other | Source: Ambulatory Visit | Attending: General Surgery | Admitting: General Surgery

## 2014-09-24 DIAGNOSIS — R112 Nausea with vomiting, unspecified: Secondary | ICD-10-CM | POA: Diagnosis not present

## 2014-09-24 DIAGNOSIS — R1011 Right upper quadrant pain: Secondary | ICD-10-CM | POA: Diagnosis present

## 2014-09-24 MED ORDER — SINCALIDE 5 MCG IJ SOLR
0.0200 ug/kg | Freq: Once | INTRAMUSCULAR | Status: AC
  Administered 2014-09-24: 2 ug via INTRAVENOUS

## 2014-09-24 MED ORDER — TECHNETIUM TC 99M MEBROFENIN IV KIT
4.9000 | PACK | Freq: Once | INTRAVENOUS | Status: AC | PRN
  Administered 2014-09-24: 4.9 via INTRAVENOUS

## 2014-10-16 ENCOUNTER — Ambulatory Visit: Payer: Medicare Other

## 2014-11-27 ENCOUNTER — Ambulatory Visit: Payer: Medicare Other | Admitting: Podiatry

## 2014-12-25 ENCOUNTER — Encounter: Payer: Self-pay | Admitting: Gynecologic Oncology

## 2014-12-25 ENCOUNTER — Ambulatory Visit: Payer: Medicare Other | Attending: Gynecologic Oncology | Admitting: Gynecologic Oncology

## 2014-12-25 VITALS — BP 158/66 | HR 56 | Temp 97.9°F | Resp 19 | Ht 59.0 in | Wt 234.4 lb

## 2014-12-25 DIAGNOSIS — N8320 Unspecified ovarian cysts: Secondary | ICD-10-CM

## 2014-12-25 DIAGNOSIS — N83201 Unspecified ovarian cyst, right side: Secondary | ICD-10-CM
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
## 2014-12-25 NOTE — Patient Instructions (Signed)
You do not need to schedule a followup appointment with Dr. Denman George. Please call our office in the future if you have any questions or concerns.

## 2015-01-01 ENCOUNTER — Ambulatory Visit: Payer: Medicare Other | Admitting: Podiatry

## 2015-01-20 ENCOUNTER — Ambulatory Visit (INDEPENDENT_AMBULATORY_CARE_PROVIDER_SITE_OTHER): Payer: Medicare Other | Admitting: Podiatry

## 2015-01-20 ENCOUNTER — Encounter: Payer: Self-pay | Admitting: Podiatry

## 2015-01-20 DIAGNOSIS — M79676 Pain in unspecified toe(s): Secondary | ICD-10-CM | POA: Diagnosis not present

## 2015-01-20 DIAGNOSIS — B351 Tinea unguium: Secondary | ICD-10-CM

## 2015-01-20 NOTE — Patient Instructions (Signed)
Diabetes and Foot Care Diabetes may cause you to have problems because of poor blood supply (circulation) to your feet and legs. This may cause the skin on your feet to become thinner, break easier, and heal more slowly. Your skin may become dry, and the skin may peel and crack. You may also have nerve damage in your legs and feet causing decreased feeling in them. You may not notice minor injuries to your feet that could lead to infections or more serious problems. Taking care of your feet is one of the most important things you can do for yourself.  HOME CARE INSTRUCTIONS  Wear shoes at all times, even in the house. Do not go barefoot. Bare feet are easily injured.  Check your feet daily for blisters, cuts, and redness. If you cannot see the bottom of your feet, use a mirror or ask someone for help.  Wash your feet with warm water (do not use hot water) and mild soap. Then pat your feet and the areas between your toes until they are completely dry. Do not soak your feet as this can dry your skin.  Apply a moisturizing lotion or petroleum jelly (that does not contain alcohol and is unscented) to the skin on your feet and to dry, brittle toenails. Do not apply lotion between your toes.  Trim your toenails straight across. Do not dig under them or around the cuticle. File the edges of your nails with an emery board or nail file.  Do not cut corns or calluses or try to remove them with medicine.  Wear clean socks or stockings every day. Make sure they are not too tight. Do not wear knee-high stockings since they may decrease blood flow to your legs.  Wear shoes that fit properly and have enough cushioning. To break in new shoes, wear them for just a few hours a day. This prevents you from injuring your feet. Always look in your shoes before you put them on to be sure there are no objects inside.  Do not cross your legs. This may decrease the blood flow to your feet.  If you find a minor scrape,  cut, or break in the skin on your feet, keep it and the skin around it clean and dry. These areas may be cleansed with mild soap and water. Do not cleanse the area with peroxide, alcohol, or iodine.  When you remove an adhesive bandage, be sure not to damage the skin around it.  If you have a wound, look at it several times a day to make sure it is healing.  Do not use heating pads or hot water bottles. They may burn your skin. If you have lost feeling in your feet or legs, you may not know it is happening until it is too late.  Make sure your health care provider performs a complete foot exam at least annually or more often if you have foot problems. Report any cuts, sores, or bruises to your health care provider immediately. SEEK MEDICAL CARE IF:   You have an injury that is not healing.  You have cuts or breaks in the skin.  You have an ingrown nail.  You notice redness on your legs or feet.  You feel burning or tingling in your legs or feet.  You have pain or cramps in your legs and feet.  Your legs or feet are numb.  Your feet always feel cold. SEEK IMMEDIATE MEDICAL CARE IF:   There is increasing redness,   swelling, or pain in or around a wound.  There is a red line that goes up your leg.  Pus is coming from a wound.  You develop a fever or as directed by your health care provider.  You notice a bad smell coming from an ulcer or wound. Document Released: 05/13/2000 Document Revised: 01/16/2013 Document Reviewed: 10/23/2012 ExitCare Patient Information 2015 [employer]. This information is not intended to replace advice given to you by your health care provider. Make sure you discuss any questions you have with your health care provider.  

## 2015-01-20 NOTE — Progress Notes (Signed)
Patient ID: Amanda Cain, female   DOB: [birth year], 69 y.o.   MRN: [id number]  Subjective: This patient presents today requesting debridement of painful toenails. The last schedule visit for similar service was 04/14/2014  Patient denies history of claudication or ulceration or amputation  Vascular: DP pulses 2/4 bilaterally PT pulses 1/4 bilaterally Capillary reflex immediate bilaterally  Neurological: Sensation to 10 g monofilament wire intact 5/5 bilaterally Vibratory sensation reactive bilaterally Ankle reflex equal and reactive bilaterally  Dermatological: The toenails are elongated, brittle, discolored, incurvated and tender to direct palpation 6-10  Musculoskeletal: Pes planus bilaterally  Assessment: Satisfactory neurovascular status Diabetic without complications Symptomatic onychomycoses 6-10  Plan: Debridement toenails 10 mechanically and electrically without a bleeding  Reappoint 3 months

## 2015-01-29 ENCOUNTER — Ambulatory Visit: Payer: Self-pay | Admitting: General Surgery

## 2015-01-29 NOTE — H&P (Signed)
History of Present Illness Amanda Ok MD; 01/29/2015 11:13 AM) Patient words: Recheck GB.  The patient is a 69 year old female who presents for evaluation of gall stones. The patient is a 70 year old female comes in today continue with abdominal pain/right upper quadrant pain. Patient has had a previous ultrasound revealed gallstones. All work up at this point has been negative to include a HIDA scan which was normal. Patient states she is unable to recognize a pattern from her right upper quadrant abdominal pain.   Medication History Illene Regulus, CMA; 01/29/2015 10:54 AM) Medications Reconciled  Review of Systems Amanda Ok MD; 01/29/2015 11:13 AM) General Present- Feeling well. Not Present- Fever. Respiratory Not Present- Cough and Difficulty Breathing. Cardiovascular Not Present- Chest Pain. Gastrointestinal Present- Abdominal Pain and Nausea. Musculoskeletal Not Present- Myalgia. Neurological Not Present- Weakness.   Vitals (Alisha Spillers CMA; 01/29/2015 10:54 AM) 01/29/2015 10:53 AM Weight: 233 lb Height: 59in Body Surface Area: 2.1 m Body Mass Index: 47.06 kg/m Temp.: 97.54F(Oral)  Pulse: 66 (Regular)  BP: 168/82 (Sitting, Left Arm, Standard)    Physical Exam Amanda Ok MD; 01/29/2015 11:13 AM) General Mental Status-Alert. General Appearance-Consistent with stated age. Hydration-Well hydrated. Voice-Normal.  Head and Neck Head-normocephalic, atraumatic with no lesions or palpable masses.  Eye Eyeball - Bilateral-Extraocular movements intact. Sclera/Conjunctiva - Bilateral-No scleral icterus.  Chest and Lung Exam Chest and lung exam reveals -quiet, even and easy respiratory effort with no use of accessory muscles. Inspection Chest Wall - Normal. Back - normal.  Cardiovascular Cardiovascular examination reveals -normal heart sounds, regular rate and rhythm with no murmurs.  Abdomen Inspection Normal Exam - No  Hernias. Palpation/Percussion Normal exam - Soft, Non Tender, No Rebound tenderness, No Rigidity (guarding) and No hepatosplenomegaly. Auscultation Normal exam - Bowel sounds normal.  Neurologic Neurologic evaluation reveals -alert and oriented x 3 with no impairment of recent or remote memory. Mental Status-Normal.  Musculoskeletal Normal Exam - Left-Upper Extremity Strength Normal and Lower Extremity Strength Normal. Normal Exam - Right-Upper Extremity Strength Normal, Lower Extremity Weakness.    Assessment & Plan Amanda Ok MD; 01/29/2015 11:14 AM) GALLSTONES (574.20  K80.20) Impression: 69 year old female with gallstones. We trialed a time of low-fat foods see if this helps with her pain. This is not been helping and she is continue with right upper quadrant pain Graettinger shoulder. I believe at this point she would benefit from a laparoscopic cholecystectomy.  1. Will proceed to the operative for laparoscopic cholecystectomy 2. Risks and benefits were discussed with the patient to generally include, but not limited to: infection, bleeding, possible need for post op ERCP, damage to the bile ducts, bile leak, and possible need for further surgery. Alternatives were offered and described. All questions were answered and the patient voiced understanding of the procedure and wishes to proceed at this point with a laparoscopic cholecystectomy

## 2015-02-06 ENCOUNTER — Emergency Department (HOSPITAL_COMMUNITY)
Admission: EM | Admit: 2015-02-06 | Discharge: 2015-02-06 | Discharge status: Absent without leave | Payer: Medicare Other | Source: Home / Self Care

## 2015-02-19 NOTE — Patient Instructions (Addendum)
Amanda Cain  02/19/2015   Your procedure is scheduled on:    03/02/2015    Report to [facility] Main  Entrance take [place]  elevators to 3rd floor to  [place] at     [place] AM.  Call this number if you have problems the morning of surgery [phone]   Remember: ONLY 1 PERSON MAY GO WITH YOU TO SHORT STAY TO GET  READY MORNING OF [place].  Do not eat food or drink liquids :After Midnight.            Eat a good healthy snack prior to bedtime.      Take these medicines the morning of surgery with A SIP OF WATER:   Levothyroxine, Ativan, toprol, Protonix , Taxtia, flonase, eye drops if needed  DO NOT TAKE ANY DIABETIC MEDICATIONS DAY OF YOUR SURGERY                               You may not have any metal on your body including hair pins and              piercings  Do not wear jewelry, make-up, lotions, powders or perfumes, deodorant             Do not wear nail polish.  Do not shave  48 hours prior to surgery.                 Do not bring valuables to the hospital. [place].  Contacts, dentures or bridgework may not be worn into surgery.      Patients discharged the day of surgery will not be allowed to drive home.  Name and phone number of your driver:  Special Instructions: coughing and deep breathing exercises, leg exercises               Please read over the following fact sheets you were given: _____________________________________________________________________             [facility] - Preparing for Surgery Before surgery, you can play an important role.  Because skin is not sterile, your skin needs to be as free of germs as possible.  You can reduce the number of germs on your skin by washing with CHG (chlorahexidine gluconate) soap before surgery.  CHG is an antiseptic cleaner which kills germs and bonds with the skin to continue killing germs even after washing. Please DO NOT use  if you have an allergy to CHG or antibacterial soaps.  If your skin becomes reddened/irritated stop using the CHG and inform your nurse when you arrive at Short Stay. Do not shave (including legs and underarms) for at least 48 hours prior to the first CHG shower.  You may shave your face/neck. Please follow these instructions carefully:  1.  Shower with CHG Soap the night before surgery and the  morning of Surgery.  2.  If you choose to wash your hair, wash your hair first as usual with your  normal  shampoo.  3.  After you shampoo, rinse your hair and body thoroughly to remove the  shampoo.  4.  Use CHG as you would any other liquid soap.  You can apply chg directly  to the skin and wash                       Gently with a scrungie or clean washcloth.  5.  Apply the CHG Soap to your body ONLY FROM THE NECK DOWN.   Do not use on face/ open                           Wound or open sores. Avoid contact with eyes, ears mouth and genitals (private parts).                       Wash face,  Genitals (private parts) with your normal soap.             6.  Wash thoroughly, paying special attention to the area where your surgery  will be performed.  7.  Thoroughly rinse your body with warm water from the neck down.  8.  DO NOT shower/wash with your normal soap after using and rinsing off  the CHG Soap.                9.  Pat yourself dry with a clean towel.            10.  Wear clean pajamas.            11.  Place clean sheets on your bed the night of your first shower and do not  sleep with pets. Day of Surgery : Do not apply any lotions/deodorants the morning of surgery.  Please wear clean clothes to the hospital/surgery center.  FAILURE TO FOLLOW THESE INSTRUCTIONS MAY RESULT IN THE CANCELLATION OF YOUR SURGERY PATIENT SIGNATURE_________________________________  NURSE  SIGNATURE__________________________________  ________________________________________________________________________

## 2015-02-24 ENCOUNTER — Encounter (HOSPITAL_COMMUNITY)
Admission: RE | Admit: 2015-02-24 | Discharge: 2015-02-24 | Disposition: A | Discharge status: Home / Self Care | Payer: Medicare Other | Source: Ambulatory Visit | Attending: General Surgery | Admitting: General Surgery

## 2015-02-24 ENCOUNTER — Encounter (HOSPITAL_COMMUNITY): Payer: Self-pay

## 2015-02-24 DIAGNOSIS — Z01818 Encounter for other preprocedural examination: Secondary | ICD-10-CM | POA: Diagnosis present

## 2015-02-24 DIAGNOSIS — K8080 Other cholelithiasis without obstruction: Secondary | ICD-10-CM | POA: Diagnosis not present

## 2015-02-24 LAB — CBC
HCT: 33.7 % — ABNORMAL LOW (ref 36.0–46.0)
Hemoglobin: 10.5 g/dL — ABNORMAL LOW (ref 12.0–15.0)
MCH: 28 pg (ref 26.0–34.0)
MCHC: 31.2 g/dL (ref 30.0–36.0)
MCV: 89.9 fL (ref 78.0–100.0)
Platelets: 322 10*3/uL (ref 150–400)
RBC: 3.75 MIL/uL — ABNORMAL LOW (ref 3.87–5.11)
RDW: 15.6 % — ABNORMAL HIGH (ref 11.5–15.5)
WBC: 6.3 10*3/uL (ref 4.0–10.5)

## 2015-02-24 LAB — BASIC METABOLIC PANEL
Anion gap: 9 (ref 5–15)
BUN: 22 mg/dL — ABNORMAL HIGH (ref 6–20)
CO2: 26 mmol/L (ref 22–32)
Calcium: 9.5 mg/dL (ref 8.9–10.3)
Chloride: 103 mmol/L (ref 101–111)
Creatinine, Ser: 1.45 mg/dL — ABNORMAL HIGH (ref 0.44–1.00)
GFR calc Af Amer: 41 mL/min — ABNORMAL LOW (ref >60)
GFR calc non Af Amer: 35 mL/min — ABNORMAL LOW (ref >60)
Glucose, Bld: 145 mg/dL — ABNORMAL HIGH (ref 65–99)
Potassium: 4.3 mmol/L (ref 3.5–5.1)
Sodium: 138 mmol/L (ref 135–145)

## 2015-02-24 NOTE — Progress Notes (Signed)
CBC and BMP results done 02/24/2015 faxed via EPIC to Dr Ralene Ok.

## 2015-02-25 NOTE — Progress Notes (Signed)
Final EKg done 02/24/15 in [facility]

## 2015-03-02 ENCOUNTER — Ambulatory Visit (HOSPITAL_COMMUNITY): Payer: Medicare Other | Admitting: Certified Registered"

## 2015-03-02 ENCOUNTER — Encounter (HOSPITAL_COMMUNITY): Payer: Self-pay | Admitting: *Deleted

## 2015-03-02 ENCOUNTER — Observation Stay (HOSPITAL_COMMUNITY)
Admission: RE | Admit: 2015-03-02 | Discharge: 2015-03-03 | Disposition: A | Discharge status: Home / Self Care | Payer: Medicare Other | Source: Ambulatory Visit | Attending: General Surgery | Admitting: General Surgery

## 2015-03-02 ENCOUNTER — Encounter (HOSPITAL_COMMUNITY)
Admission: RE | Disposition: A | Discharge status: Home / Self Care | Payer: Self-pay | Source: Ambulatory Visit | Attending: General Surgery

## 2015-03-02 DIAGNOSIS — I10 Essential (primary) hypertension: Secondary | ICD-10-CM | POA: Diagnosis not present

## 2015-03-02 DIAGNOSIS — E669 Obesity, unspecified: Secondary | ICD-10-CM | POA: Insufficient documentation

## 2015-03-02 DIAGNOSIS — R1011 Right upper quadrant pain: Secondary | ICD-10-CM | POA: Diagnosis present

## 2015-03-02 DIAGNOSIS — E119 Type 2 diabetes mellitus without complications: Secondary | ICD-10-CM | POA: Diagnosis not present

## 2015-03-02 DIAGNOSIS — K219 Gastro-esophageal reflux disease without esophagitis: Secondary | ICD-10-CM | POA: Diagnosis not present

## 2015-03-02 DIAGNOSIS — K8010 Calculus of gallbladder with chronic cholecystitis without obstruction: Secondary | ICD-10-CM | POA: Diagnosis not present

## 2015-03-02 DIAGNOSIS — Z9049 Acquired absence of other specified parts of digestive tract: Secondary | ICD-10-CM

## 2015-03-02 DIAGNOSIS — F329 Major depressive disorder, single episode, unspecified: Secondary | ICD-10-CM | POA: Diagnosis not present

## 2015-03-02 DIAGNOSIS — Z6841 Body Mass Index (BMI) 40.0 and over, adult: Secondary | ICD-10-CM | POA: Insufficient documentation

## 2015-03-02 DIAGNOSIS — Z23 Encounter for immunization: Secondary | ICD-10-CM | POA: Diagnosis not present

## 2015-03-02 DIAGNOSIS — F419 Anxiety disorder, unspecified: Secondary | ICD-10-CM | POA: Diagnosis not present

## 2015-03-02 DIAGNOSIS — E039 Hypothyroidism, unspecified: Secondary | ICD-10-CM | POA: Diagnosis not present

## 2015-03-02 HISTORY — PX: CHOLECYSTECTOMY: SHX55

## 2015-03-02 LAB — GLUCOSE, CAPILLARY
Glucose-Capillary: 132 mg/dL — ABNORMAL HIGH (ref 65–99)
Glucose-Capillary: 226 mg/dL — ABNORMAL HIGH (ref 65–99)
Glucose-Capillary: 209 mg/dL — ABNORMAL HIGH (ref 65–99)
Glucose-Capillary: 271 mg/dL — ABNORMAL HIGH (ref 65–99)
Glucose-Capillary: 232 mg/dL — ABNORMAL HIGH (ref 65–99)

## 2015-03-02 SURGERY — LAPAROSCOPIC CHOLECYSTECTOMY
Anesthesia: General | Site: Abdomen

## 2015-03-02 MED ORDER — DEXTROSE-NACL 5-0.9 % IV SOLN
INTRAVENOUS | Status: DC
  Administered 2015-03-02: 16:00:00 via INTRAVENOUS

## 2015-03-02 MED ORDER — ACETAMINOPHEN 500 MG PO TABS: 500.0000 mg | ORAL_TABLET | Freq: Four times a day (QID) | ORAL | Status: DC | PRN

## 2015-03-02 MED ORDER — LACTATED RINGERS IV SOLN
INTRAVENOUS | Status: DC | PRN
  Administered 2015-03-02: 07:00:00 via INTRAVENOUS

## 2015-03-02 MED ORDER — DEXAMETHASONE SODIUM PHOSPHATE 10 MG/ML IJ SOLN
INTRAMUSCULAR | Status: DC | PRN
  Administered 2015-03-02: 10 mg via INTRAVENOUS

## 2015-03-02 MED ORDER — LORAZEPAM 0.5 MG PO TABS
0.5000 mg | ORAL_TABLET | Freq: Two times a day (BID) | ORAL | Status: DC
  Administered 2015-03-02 – 2015-03-03 (×2): 0.5 mg via ORAL
  Filled 2015-03-02 (×2): qty 1

## 2015-03-02 MED ORDER — DEXAMETHASONE SODIUM PHOSPHATE 10 MG/ML IJ SOLN
INTRAMUSCULAR | Status: AC
  Filled 2015-03-02: qty 1

## 2015-03-02 MED ORDER — FLAX SEEDS PO POWD: Freq: Every day | ORAL | Status: DC

## 2015-03-02 MED ORDER — VITAMIN B-12 1000 MCG PO TABS
1000.0000 ug | ORAL_TABLET | Freq: Every day | ORAL | Status: DC
  Administered 2015-03-03: 1000 ug via ORAL
  Filled 2015-03-02: qty 1

## 2015-03-02 MED ORDER — SUCCINYLCHOLINE CHLORIDE 20 MG/ML IJ SOLN
INTRAMUSCULAR | Status: DC | PRN
  Administered 2015-03-02: 100 mg via INTRAVENOUS

## 2015-03-02 MED ORDER — LACTATED RINGERS IR SOLN
Status: DC | PRN
  Administered 2015-03-02: 1000 mL

## 2015-03-02 MED ORDER — EPHEDRINE SULFATE 50 MG/ML IJ SOLN
INTRAMUSCULAR | Status: AC
  Filled 2015-03-02: qty 1

## 2015-03-02 MED ORDER — METOPROLOL SUCCINATE ER 100 MG PO TB24
100.0000 mg | ORAL_TABLET | Freq: Every morning | ORAL | Status: DC
  Administered 2015-03-03: 100 mg via ORAL
  Filled 2015-03-02: qty 1

## 2015-03-02 MED ORDER — BUPIVACAINE-EPINEPHRINE (PF) 0.25% -1:200000 IJ SOLN
INTRAMUSCULAR | Status: DC | PRN
  Administered 2015-03-02: 15 mL

## 2015-03-02 MED ORDER — ROCURONIUM BROMIDE 100 MG/10ML IV SOLN
INTRAVENOUS | Status: AC
  Filled 2015-03-02: qty 1

## 2015-03-02 MED ORDER — CEFAZOLIN SODIUM-DEXTROSE 2-3 GM-% IV SOLR
2.0000 g | INTRAVENOUS | Status: AC
  Administered 2015-03-02: 2 g via INTRAVENOUS

## 2015-03-02 MED ORDER — CALCIUM CITRATE-VITAMIN D 500-400 MG-UNIT PO CHEW
CHEWABLE_TABLET | Freq: Every day | ORAL | Status: DC
  Administered 2015-03-03: 10:00:00 via ORAL
  Filled 2015-03-02: qty 2

## 2015-03-02 MED ORDER — GLUCAGON HCL RDNA (DIAGNOSTIC) 1 MG IJ SOLR
INTRAMUSCULAR | Status: AC
  Filled 2015-03-02: qty 1

## 2015-03-02 MED ORDER — LACTATED RINGERS IV SOLN: INTRAVENOUS | Status: DC

## 2015-03-02 MED ORDER — ESCITALOPRAM OXALATE 20 MG PO TABS
20.0000 mg | ORAL_TABLET | Freq: Every day | ORAL | Status: DC
  Administered 2015-03-02: 20 mg via ORAL
  Filled 2015-03-02 (×2): qty 1

## 2015-03-02 MED ORDER — PALIPERIDONE PALMITATE 156 MG/ML IM SUSP: 156.0000 mg | INTRAMUSCULAR | Status: DC

## 2015-03-02 MED ORDER — ADULT MULTIVITAMIN W/MINERALS CH
1.0000 | ORAL_TABLET | Freq: Every day | ORAL | Status: DC
  Administered 2015-03-03: 1 via ORAL
  Filled 2015-03-02: qty 1

## 2015-03-02 MED ORDER — PROPOFOL 10 MG/ML IV BOLUS
INTRAVENOUS | Status: AC
  Filled 2015-03-02: qty 20

## 2015-03-02 MED ORDER — SUGAMMADEX SODIUM 200 MG/2ML IV SOLN
INTRAVENOUS | Status: AC
  Filled 2015-03-02: qty 2

## 2015-03-02 MED ORDER — CEFAZOLIN SODIUM-DEXTROSE 2-3 GM-% IV SOLR
INTRAVENOUS | Status: AC
  Filled 2015-03-02: qty 50

## 2015-03-02 MED ORDER — OLMESARTAN MEDOXOMIL-HCTZ 40-25 MG PO TABS: 1.0000 | ORAL_TABLET | Freq: Every morning | ORAL | Status: DC

## 2015-03-02 MED ORDER — ATORVASTATIN CALCIUM 40 MG PO TABS
40.0000 mg | ORAL_TABLET | Freq: Every day | ORAL | Status: DC
  Administered 2015-03-02: 40 mg via ORAL
  Filled 2015-03-02 (×2): qty 1

## 2015-03-02 MED ORDER — DILTIAZEM HCL ER BEADS 240 MG PO CP24
240.0000 mg | ORAL_CAPSULE | Freq: Every morning | ORAL | Status: DC
  Administered 2015-03-03: 240 mg via ORAL
  Filled 2015-03-02: qty 1

## 2015-03-02 MED ORDER — SUGAMMADEX SODIUM 200 MG/2ML IV SOLN
INTRAVENOUS | Status: DC | PRN
  Administered 2015-03-02: 200 mg via INTRAVENOUS

## 2015-03-02 MED ORDER — SUCRALFATE 1 G PO TABS
1.0000 g | ORAL_TABLET | Freq: Two times a day (BID) | ORAL | Status: DC
  Administered 2015-03-02 – 2015-03-03 (×2): 1 g via ORAL
  Filled 2015-03-02 (×3): qty 1

## 2015-03-02 MED ORDER — VITAMIN E 180 MG (400 UNIT) PO CAPS
400.0000 [IU] | ORAL_CAPSULE | Freq: Every day | ORAL | Status: DC
  Administered 2015-03-03: 400 [IU] via ORAL
  Filled 2015-03-02: qty 1

## 2015-03-02 MED ORDER — OXYCODONE-ACETAMINOPHEN 5-325 MG PO TABS: 1.0000 | ORAL_TABLET | ORAL | Status: DC | PRN

## 2015-03-02 MED ORDER — LIDOCAINE HCL (CARDIAC) 20 MG/ML IV SOLN
INTRAVENOUS | Status: AC
Start: 2015-03-02 — End: 2015-03-02
  Filled 2015-03-02: qty 5

## 2015-03-02 MED ORDER — MIDAZOLAM HCL 2 MG/2ML IJ SOLN
INTRAMUSCULAR | Status: AC
  Filled 2015-03-02: qty 4

## 2015-03-02 MED ORDER — INFLUENZA VAC SPLIT QUAD 0.5 ML IM SUSY
0.5000 mL | PREFILLED_SYRINGE | INTRAMUSCULAR | Status: AC
  Administered 2015-03-03: 0.5 mL via INTRAMUSCULAR
  Filled 2015-03-02 (×2): qty 0.5

## 2015-03-02 MED ORDER — LEVOTHYROXINE SODIUM 125 MCG PO TABS
125.0000 ug | ORAL_TABLET | Freq: Every day | ORAL | Status: DC
  Administered 2015-03-03: 125 ug via ORAL
  Filled 2015-03-02 (×2): qty 1

## 2015-03-02 MED ORDER — HYDROCHLOROTHIAZIDE 25 MG PO TABS
25.0000 mg | ORAL_TABLET | Freq: Every day | ORAL | Status: DC
  Administered 2015-03-02 – 2015-03-03 (×2): 25 mg via ORAL
  Filled 2015-03-02 (×2): qty 1

## 2015-03-02 MED ORDER — VITAMIN C 500 MG PO TABS
500.0000 mg | ORAL_TABLET | Freq: Every day | ORAL | Status: DC
Start: 2015-03-03 — End: 2015-03-03
  Administered 2015-03-03: 500 mg via ORAL
  Filled 2015-03-02: qty 1

## 2015-03-02 MED ORDER — IRBESARTAN 300 MG PO TABS
300.0000 mg | ORAL_TABLET | Freq: Every day | ORAL | Status: DC
  Administered 2015-03-02 – 2015-03-03 (×2): 300 mg via ORAL
  Filled 2015-03-02 (×2): qty 1

## 2015-03-02 MED ORDER — INSULIN ASPART 100 UNIT/ML ~~LOC~~ SOLN
0.0000 [IU] | Freq: Three times a day (TID) | SUBCUTANEOUS | Status: DC
  Administered 2015-03-02: 8 [IU] via SUBCUTANEOUS
  Administered 2015-03-03: 5 [IU] via SUBCUTANEOUS

## 2015-03-02 MED ORDER — ONDANSETRON 4 MG PO TBDP: 4.0000 mg | ORAL_TABLET | Freq: Four times a day (QID) | ORAL | Status: DC | PRN

## 2015-03-02 MED ORDER — PANTOPRAZOLE SODIUM 40 MG PO TBEC
40.0000 mg | DELAYED_RELEASE_TABLET | Freq: Two times a day (BID) | ORAL | Status: DC
  Administered 2015-03-02 – 2015-03-03 (×2): 40 mg via ORAL
  Filled 2015-03-02 (×3): qty 1

## 2015-03-02 MED ORDER — NALOXONE HCL 0.4 MG/ML IJ SOLN
INTRAMUSCULAR | Status: AC
  Filled 2015-03-02: qty 1

## 2015-03-02 MED ORDER — POLYVINYL ALCOHOL 1.4 % OP SOLN
1.0000 [drp] | Freq: Every day | OPHTHALMIC | Status: DC | PRN
  Filled 2015-03-02: qty 15

## 2015-03-02 MED ORDER — BIOTIN 5000 MCG PO CAPS: 5000.0000 ug | ORAL_CAPSULE | Freq: Every day | ORAL | Status: DC

## 2015-03-02 MED ORDER — ONDANSETRON HCL 4 MG/2ML IJ SOLN
INTRAMUSCULAR | Status: AC
  Filled 2015-03-02: qty 2

## 2015-03-02 MED ORDER — CHLORHEXIDINE GLUCONATE 4 % EX LIQD: 1.0000 "application " | Freq: Once | CUTANEOUS | Status: DC

## 2015-03-02 MED ORDER — EPHEDRINE SULFATE 50 MG/ML IJ SOLN
INTRAMUSCULAR | Status: DC | PRN
  Administered 2015-03-02: 10 mg via INTRAVENOUS

## 2015-03-02 MED ORDER — NALOXONE HCL 0.4 MG/ML IJ SOLN
INTRAMUSCULAR | Status: DC | PRN
  Administered 2015-03-02: 40 ug via INTRAVENOUS

## 2015-03-02 MED ORDER — LIDOCAINE HCL (CARDIAC) 20 MG/ML IV SOLN
INTRAVENOUS | Status: DC | PRN
  Administered 2015-03-02: 50 mg via INTRAVENOUS

## 2015-03-02 MED ORDER — FENTANYL CITRATE (PF) 100 MCG/2ML IJ SOLN
INTRAMUSCULAR | Status: DC | PRN
  Administered 2015-03-02 (×4): 50 ug via INTRAVENOUS

## 2015-03-02 MED ORDER — LINAGLIPTIN 5 MG PO TABS
5.0000 mg | ORAL_TABLET | Freq: Every day | ORAL | Status: DC
  Administered 2015-03-02 – 2015-03-03 (×2): 5 mg via ORAL
  Filled 2015-03-02 (×2): qty 1

## 2015-03-02 MED ORDER — OXYCODONE HCL 5 MG PO TABS
5.0000 mg | ORAL_TABLET | ORAL | Status: DC | PRN
  Administered 2015-03-02 (×2): 5 mg via ORAL
  Filled 2015-03-02 (×2): qty 1

## 2015-03-02 MED ORDER — PROPOFOL 10 MG/ML IV BOLUS
INTRAVENOUS | Status: DC | PRN
  Administered 2015-03-02: 140 mg via INTRAVENOUS

## 2015-03-02 MED ORDER — ROCURONIUM BROMIDE 100 MG/10ML IV SOLN
INTRAVENOUS | Status: DC | PRN
Start: 2015-03-02 — End: 2015-03-02
  Administered 2015-03-02: 5 mg via INTRAVENOUS
  Administered 2015-03-02: 10 mg via INTRAVENOUS
  Administered 2015-03-02: 25 mg via INTRAVENOUS

## 2015-03-02 MED ORDER — FERROUS SULFATE 325 (65 FE) MG PO TABS
325.0000 mg | ORAL_TABLET | Freq: Every day | ORAL | Status: DC
  Administered 2015-03-03: 325 mg via ORAL
  Filled 2015-03-02 (×2): qty 1

## 2015-03-02 MED ORDER — 0.9 % SODIUM CHLORIDE (POUR BTL) OPTIME
TOPICAL | Status: DC | PRN
  Administered 2015-03-02: 1000 mL

## 2015-03-02 MED ORDER — ONDANSETRON HCL 4 MG/2ML IJ SOLN
INTRAMUSCULAR | Status: DC | PRN
  Administered 2015-03-02: 4 mg via INTRAVENOUS

## 2015-03-02 MED ORDER — ONDANSETRON HCL 4 MG/2ML IJ SOLN: 4.0000 mg | Freq: Four times a day (QID) | INTRAMUSCULAR | Status: DC | PRN

## 2015-03-02 MED ORDER — MIDAZOLAM HCL 5 MG/5ML IJ SOLN
INTRAMUSCULAR | Status: DC | PRN
Start: 2015-03-02 — End: 2015-03-02
  Administered 2015-03-02 (×2): 1 mg via INTRAVENOUS

## 2015-03-02 MED ORDER — ESTRADIOL 1 MG PO TABS
0.5000 mg | ORAL_TABLET | Freq: Every morning | ORAL | Status: DC
Start: 2015-03-02 — End: 2015-03-03
  Administered 2015-03-02 – 2015-03-03 (×2): 0.5 mg via ORAL
  Filled 2015-03-02 (×2): qty 0.5

## 2015-03-02 MED ORDER — POTASSIUM CHLORIDE ER 10 MEQ PO TBCR
10.0000 meq | EXTENDED_RELEASE_TABLET | Freq: Every day | ORAL | Status: DC
  Administered 2015-03-02 – 2015-03-03 (×2): 10 meq via ORAL
  Filled 2015-03-02 (×2): qty 1

## 2015-03-02 MED ORDER — HYDROMORPHONE HCL 1 MG/ML IJ SOLN: 1.0000 mg | INTRAMUSCULAR | Status: DC | PRN

## 2015-03-02 MED ORDER — FENTANYL CITRATE (PF) 250 MCG/5ML IJ SOLN
INTRAMUSCULAR | Status: AC
  Filled 2015-03-02: qty 25

## 2015-03-02 MED ORDER — FLUTICASONE PROPIONATE 50 MCG/ACT NA SUSP
1.0000 | Freq: Every day | NASAL | Status: DC
  Administered 2015-03-03: 1 via NASAL
  Filled 2015-03-02: qty 16

## 2015-03-02 MED ORDER — BUPIVACAINE-EPINEPHRINE 0.25% -1:200000 IJ SOLN
INTRAMUSCULAR | Status: AC
Start: 2015-03-02 — End: 2015-03-02
  Filled 2015-03-02: qty 1

## 2015-03-02 MED ORDER — SACCHAROMYCES BOULARDII 250 MG PO CAPS
250.0000 mg | ORAL_CAPSULE | Freq: Three times a day (TID) | ORAL | Status: DC
Start: 2015-03-02 — End: 2015-03-03
  Administered 2015-03-02 – 2015-03-03 (×2): 250 mg via ORAL
  Filled 2015-03-02 (×5): qty 1

## 2015-03-02 SURGICAL SUPPLY — 38 items
APL SKNCLS STERI-STRIP NONHPOA (GAUZE/BANDAGES/DRESSINGS)
APPLIER CLIP 5 13 M/L LIGAMAX5 (MISCELLANEOUS)
APR CLP MED LRG 5 ANG JAW (MISCELLANEOUS)
BENZOIN TINCTURE PRP APPL 2/3 (GAUZE/BANDAGES/DRESSINGS) IMPLANT
CABLE HIGH FREQUENCY MONO STRZ (ELECTRODE) ×2 IMPLANT
CHLORAPREP W/TINT 26ML (MISCELLANEOUS) ×2 IMPLANT
CLIP APPLIE 5 13 M/L LIGAMAX5 (MISCELLANEOUS) IMPLANT
CLIP LIGATING HEMO O LOK GREEN (MISCELLANEOUS) ×2 IMPLANT
COVER MAYO STAND STRL (DRAPES) IMPLANT
COVER SURGICAL LIGHT HANDLE (MISCELLANEOUS) ×2 IMPLANT
COVER TRANSDUCER ULTRASND (DRAPES) ×2 IMPLANT
DECANTER SPIKE VIAL GLASS SM (MISCELLANEOUS) ×2 IMPLANT
DEVICE TROCAR PUNCTURE CLOSURE (ENDOMECHANICALS) ×2 IMPLANT
DRAPE C-ARM 42X120 X-RAY (DRAPES) IMPLANT
DRAPE LAPAROSCOPIC ABDOMINAL (DRAPES) ×2 IMPLANT
DRAPE UTILITY XL STRL (DRAPES) ×2 IMPLANT
ELECT REM PT RETURN 9FT ADLT (ELECTROSURGICAL) ×2
ELECTRODE REM PT RTRN 9FT ADLT (ELECTROSURGICAL) ×1 IMPLANT
GAUZE SPONGE 2X2 8PLY STRL LF (GAUZE/BANDAGES/DRESSINGS) ×1 IMPLANT
GAUZE SPONGE 4X4 12PLY STRL (GAUZE/BANDAGES/DRESSINGS) ×2 IMPLANT
GLOVE BIO SURGEON STRL SZ7.5 (GLOVE) ×2 IMPLANT
GOWN STRL REUS W/TWL XL LVL3 (GOWN DISPOSABLE) ×4 IMPLANT
HEMOSTAT SURGICEL 4X8 (HEMOSTASIS) IMPLANT
KIT BASIN OR (CUSTOM PROCEDURE TRAY) ×2 IMPLANT
NDL INSUFFLATION 14GA 120MM (NEEDLE) ×1 IMPLANT
NEEDLE INSUFFLATION 14GA 120MM (NEEDLE) ×2 IMPLANT
SCISSORS LAP 5X35 DISP (ENDOMECHANICALS) ×2 IMPLANT
SET CHOLANGIOGRAPH MIX (MISCELLANEOUS) IMPLANT
SET IRRIG TUBING LAPAROSCOPIC (IRRIGATION / IRRIGATOR) ×2 IMPLANT
SPONGE GAUZE 2X2 STER 10/PKG (GAUZE/BANDAGES/DRESSINGS) ×1
STRIP CLOSURE SKIN 1/2X4 (GAUZE/BANDAGES/DRESSINGS) ×2 IMPLANT
SUT MNCRL AB 4-0 PS2 18 (SUTURE) ×2 IMPLANT
TOWEL OR 17X26 10 PK STRL BLUE (TOWEL DISPOSABLE) ×2 IMPLANT
TOWEL OR NON WOVEN STRL DISP B (DISPOSABLE) ×2 IMPLANT
TRAY LAPAROSCOPIC (CUSTOM PROCEDURE TRAY) ×2 IMPLANT
TROCAR BLADELESS OPT 5 75 (ENDOMECHANICALS) ×2 IMPLANT
TROCAR SLEEVE XCEL 5X75 (ENDOMECHANICALS) ×2 IMPLANT
TROCAR XCEL NON-BLD 11X100MML (ENDOMECHANICALS) ×2 IMPLANT

## 2015-03-02 NOTE — Transfer of Care (Signed)
Immediate Anesthesia Transfer of Care Note  Patient: Amanda Cain  Procedure(s) Performed: Procedure(s): LAPAROSCOPIC CHOLECYSTECTOMY (N/A)  Patient Location: PACU  Anesthesia Type:General  Level of Consciousness: awake, alert  and oriented  Airway & Oxygen Therapy: Patient Spontanous Breathing and Patient connected to face mask oxygen  Post-op Assessment: Report given to RN and Post -op Vital signs reviewed and stable  Post vital signs: Reviewed and stable  Last Vitals:  Filed Vitals:   03/02/15 0525  BP: 143/75  Pulse: 65  Temp: 36.6 C  Resp: 16    Complications: No apparent anesthesia complications

## 2015-03-02 NOTE — Op Note (Signed)
03/02/2015  8:49 AM  PATIENT:  Amanda Cain  69 y.o. female  PRE-OPERATIVE DIAGNOSIS:  GALLSTONES  POST-OPERATIVE DIAGNOSIS:  GALLSTONES  PROCEDURE:  Procedure(s): LAPAROSCOPIC CHOLECYSTECTOMY (N/A)  SURGEON:  Surgeon(s) and Role:    * Ralene Ok, MD - Primary  ANESTHESIA:   local and general  EBL:   <5cc  BLOOD ADMINISTERED:none  DRAINS: none   LOCAL MEDICATIONS USED:  BUPIVICAINE   SPECIMEN:  Source of Specimen:  gallbladder  DISPOSITION OF SPECIMEN:  PATHOLOGY  COUNTS:  YES  TOURNIQUET:  * No tourniquets in log *  DICTATION: .Dragon Dictation The patient was taken to the operating and placed in the supine position with bilateral SCDs in place. The patient was prepped and draped in the usual sterile fashion. A time out was called and all facts were verified. A pneumoperitoneum was obtained via A Veress needle technique to a pressure of 30mm of mercury.  A 68mm trochar was then placed in the right upper quadrant under visualization, and there were no injuries to any abdominal organs. A 11 mm port was then placed in the umbilical region after infiltrating with local anesthesia under direct visualization. A second and third epigastric port and right lower quadrant port placement under direct visualization, respectively. The liver was very fatty and large. The gallbladder was identified and retracted, the peritoneum was then sharply dissected from the gallbladder and this dissection was carried down to Calot's triangle. The gallbladder was identified and stripped away circumferentially and seen going into the gallbladder 360, the critical angle was obtained.  2 clips were placed proximally one distally and the cystic duct transected. The cystic artery was identified and 2 clips placed proximally and one distally and transected. We then proceeded to remove the gallbladder off the hepatic fossa with Bovie cautery.  The gallbladder was very thickened on this plane. A  retrieval bag was then placed in the abdomen and gallbladder placed in the bag. The hepatic fossa was then reexamined and hemostasis was achieved with Bovie cautery and was excellent at the end of the case. The subhepatic fossa and perihepatic fossa was then irrigated until the effluent was clear. The 11 mm trocar fascia was reapproximated with the Endo Close #1 Vicryl. The pneumoperitoneum was evacuated and all trochars removed under direct visulalization. The skin was then closed with 4-0 Monocryl and the skin dressed with Steri-Strips, gauze, and tape. The patient was awaken from general anesthesia and taken to the recovery room in stable condition.   PLAN OF CARE: Discharge to home after PACU  PATIENT DISPOSITION:  PACU - hemodynamically stable.   Delay start of Pharmacological VTE agent (>24hrs) due to surgical blood loss or risk of bleeding: not applicable

## 2015-03-02 NOTE — Progress Notes (Signed)
Colin Mulders paged per Dr Oletta Lamas request

## 2015-03-02 NOTE — Progress Notes (Signed)
PHARMACIST - PHYSICIAN ORDER COMMUNICATION  CONCERNING: P&T Medication Policy on Herbal Medications  DESCRIPTION:  This patient's order for: Biotin and Flax seed  have been noted.  This product(s) is classified as an "herbal" or natural product. Due to a lack of definitive safety studies or FDA approval, nonstandard manufacturing practices, plus the potential risk of unknown drug-drug interactions while on inpatient medications, the Pharmacy and Therapeutics Committee does not permit the use of "herbal" or natural products of this type within [facility].   ACTION TAKEN: The pharmacy department is unable to verify this order at this time and your patient has been informed of this safety policy. Please reevaluate patient's clinical condition at discharge and address if the herbal or natural product(s) should be resumed at that time.  Minda Ditto PharmD Pager [phone] 03/02/2015, 1:12 PM

## 2015-03-02 NOTE — Progress Notes (Signed)
Dr Rosendo Gros aware pt 02 sats are in the 80's on RA.  Admission orders received for observation.

## 2015-03-02 NOTE — Anesthesia Postprocedure Evaluation (Signed)
  Anesthesia Post-op Note  Patient: Amanda Cain  Procedure(s) Performed: Procedure(s): LAPAROSCOPIC CHOLECYSTECTOMY (N/A)  Patient Location: PACU  Anesthesia Type:General  Level of Consciousness: awake  Airway and Oxygen Therapy: Patient Spontanous Breathing  Post-op Pain: mild  Post-op Assessment: Post-op Vital signs reviewed              Post-op Vital Signs: Reviewed  Last Vitals:  Filed Vitals:   03/02/15 0926  BP: 140/63  Pulse: 70  Temp: 36.7 C  Resp: 16    Complications: No apparent anesthesia complications

## 2015-03-02 NOTE — H&P (Signed)
History of Present Illness Amanda Ok MD; 01/29/2015 11:13 AM) Patient words: Recheck GB.  The patient is a 69 year old female who presents for evaluation of gall stones. The patient is a 69 year old female comes in today continue with abdominal pain/right upper quadrant pain. Patient has had a previous ultrasound revealed gallstones. All work up at this point has been negative to include a HIDA scan which was normal. Patient states she is unable to recognize a pattern from her right upper quadrant abdominal pain.   Medication History Illene Regulus, CMA; 01/29/2015 10:54 AM) Medications Reconciled  Review of Systems Amanda Ok MD; 01/29/2015 11:13 AM) General Present- Feeling well. Not Present- Fever. Respiratory Not Present- Cough and Difficulty Breathing. Cardiovascular Not Present- Chest Pain. Gastrointestinal Present- Abdominal Pain and Nausea. Musculoskeletal Not Present- Myalgia. Neurological Not Present- Weakness.   BP 143/75 mmHg  Pulse 65  Temp(Src) 97.9 F (36.6 C) (Oral)  Resp 16  Ht 4\' 11"  (1.499 m)  Wt 107.956 kg (238 lb)  BMI 48.04 kg/m2  SpO2 100%    Physical Exam Amanda Ok MD; 01/29/2015 11:13 AM) General Mental Status-Alert. General Appearance-Consistent with stated age. Hydration-Well hydrated. Voice-Normal.  Head and Neck Head-normocephalic, atraumatic with no lesions or palpable masses.  Eye Eyeball - Bilateral-Extraocular movements intact. Sclera/Conjunctiva - Bilateral-No scleral icterus.  Chest and Lung Exam Chest and lung exam reveals -quiet, even and easy respiratory effort with no use of accessory muscles. Inspection Chest Wall - Normal. Back - normal.  Cardiovascular Cardiovascular examination reveals -normal heart sounds, regular rate and rhythm with no murmurs.  Abdomen Inspection Normal Exam - No Hernias. Palpation/Percussion Normal exam - Soft, Non Tender, No Rebound tenderness, No Rigidity  (guarding) and No hepatosplenomegaly. Auscultation Normal exam - Bowel sounds normal.  Neurologic Neurologic evaluation reveals -alert and oriented x 3 with no impairment of recent or remote memory. Mental Status-Normal.  Musculoskeletal Normal Exam - Left-Upper Extremity Strength Normal and Lower Extremity Strength Normal. Normal Exam - Right-Upper Extremity Strength Normal, Lower Extremity Weakness.    Assessment & Plan Amanda Ok MD; 01/29/2015 11:14 AM) GALLSTONES (574.20  K80.20) Impression: 69 year old female with gallstones. We trialed a time of low-fat foods see if this helps with her pain. This is not been helping and she is continue with right upper quadrant pain Graettinger shoulder. I believe at this point she would benefit from a laparoscopic cholecystectomy.  1. Will proceed to the operative for laparoscopic cholecystectomy 2. Risks and benefits were discussed with the patient to generally include, but not limited to: infection, bleeding, possible need for post op ERCP, damage to the bile ducts, bile leak, and possible need for further surgery. Alternatives were offered and described. All questions were answered and the patient voiced understanding of the procedure and wishes to proceed at this point with a laparoscopic cholecystectomy

## 2015-03-02 NOTE — Discharge Instructions (Signed)
[facility] ______[facility] °LAPAROSCOPIC SURGERY: POST OP INSTRUCTIONS °Always review your discharge instruction sheet given to you by the facility where your surgery was performed. °IF YOU HAVE DISABILITY OR FAMILY LEAVE FORMS, YOU MUST BRING THEM TO THE OFFICE FOR PROCESSING.   °DO NOT GIVE THEM TO YOUR DOCTOR. ° °1. A prescription for pain medication may be given to you upon discharge.  Take your pain medication as prescribed, if needed.  If narcotic pain medicine is not needed, then you may take acetaminophen (Tylenol) or ibuprofen (Advil) as needed. °2. Take your usually prescribed medications unless otherwise directed. °3. If you need a refill on your pain medication, please contact your pharmacy.  They will contact our office to request authorization. Prescriptions will not be filled after 5pm or on week-ends. °4. You should follow a light diet the first few days after arrival home, such as soup and crackers, etc.  Be sure to include lots of fluids daily. °5. Most patients will experience some swelling and bruising in the area of the incisions.  Ice packs will help.  Swelling and bruising can take several days to resolve.  °6. It is common to experience some constipation if taking pain medication after surgery.  Increasing fluid intake and taking a stool softener (such as Colace) will usually help or prevent this problem from occurring.  A mild laxative (Milk of Magnesia or Miralax) should be taken according to package instructions if there are no bowel movements after 48 hours. °7. Unless discharge instructions indicate otherwise, you may remove your bandages 24-48 hours after surgery, and you may shower at that time.  You may have steri-strips (small skin tapes) in place directly over the incision.  These strips should be left on the skin for 7-10 days.  If your surgeon used skin glue on the incision, you may shower in 24 hours.  The glue will flake off over the next 2-3 weeks.  Any sutures or  staples will be removed at the office during your follow-up visit. °8. ACTIVITIES:  You may resume regular (light) daily activities beginning the next day--such as daily self-care, walking, climbing stairs--gradually increasing activities as tolerated.  You may have sexual intercourse when it is comfortable.  Refrain from any heavy lifting or straining until approved by your doctor. °a. You may drive when you are no longer taking prescription pain medication, you can comfortably wear a seatbelt, and you can safely maneuver your car and apply brakes. °b. RETURN TO WORK:  __________________________________________________________ °9. You should see your doctor in the office for a follow-up appointment approximately 2-3 weeks after your surgery.  Make sure that you call for this appointment within a day or two after you arrive home to insure a convenient appointment time. °10. OTHER INSTRUCTIONS: __________________________________________________________________________________________________________________________ __________________________________________________________________________________________________________________________ °WHEN TO CALL YOUR DOCTOR: °1. Fever over 101.0 °2. Inability to urinate °3. Continued bleeding from incision. °4. Increased pain, redness, or drainage from the incision. °5. Increasing abdominal pain ° °The clinic staff is available to answer your questions during regular business hours.  Please don’t hesitate to call and ask to speak to one of the nurses for clinical concerns.  If you have a medical emergency, go to the nearest emergency room or call 911.  A surgeon from [facility] is always on call at the hospital. °[address], [address], [place], [place]  [address] ? [address] [address], [place],    [address] °[phone] ? [phone] ? FAX [phone] °Web site:   [email or web address] °

## 2015-03-02 NOTE — Anesthesia Procedure Notes (Signed)
Procedure Name: Intubation Date/Time: 03/02/2015 7:33 AM Performed by: Noralyn Pick D Pre-anesthesia Checklist: Patient identified, Emergency Drugs available, Suction available and Patient being monitored Patient Re-evaluated:Patient Re-evaluated prior to inductionOxygen Delivery Method: Circle System Utilized Preoxygenation: Pre-oxygenation with 100% oxygen Intubation Type: IV induction Ventilation: Mask ventilation without difficulty Laryngoscope Size: Mac and 3 Grade View: Grade III Tube type: Oral Tube size: 7.5 mm Number of attempts: 1 Airway Equipment and Method: Stylet and Oral airway Placement Confirmation: ETT inserted through vocal cords under direct vision,  positive ETCO2 and breath sounds checked- equal and bilateral Secured at: 21 cm Tube secured with: Tape Dental Injury: Teeth and Oropharynx as per pre-operative assessment

## 2015-03-02 NOTE — Anesthesia Preprocedure Evaluation (Signed)
Anesthesia Evaluation  Patient identified by MRN, date of birth, ID band Patient awake    Reviewed: Allergy & Precautions, NPO status , Patient's Chart, lab work & pertinent test results  Airway Mallampati: II  TM Distance: >3 FB Neck ROM: Full    Dental   Pulmonary neg pulmonary ROS,    breath sounds clear to auscultation       Cardiovascular hypertension,  Rhythm:Regular Rate:Normal     Neuro/Psych Anxiety Depression    GI/Hepatic Neg liver ROS, GERD  ,  Endo/Other  diabetesHypothyroidism   Renal/GU Renal disease     Musculoskeletal   Abdominal   Peds  Hematology   Anesthesia Other Findings   Reproductive/Obstetrics                             Anesthesia Physical Anesthesia Plan  ASA: III  Anesthesia Plan: General   Post-op Pain Management:    Induction: Intravenous  Airway Management Planned: Oral ETT  Additional Equipment:   Intra-op Plan:   Post-operative Plan: Extubation in OR  Informed Consent: I have reviewed the patients History and Physical, chart, labs and discussed the procedure including the risks, benefits and alternatives for the proposed anesthesia with the patient or authorized representative who has indicated his/her understanding and acceptance.   Dental advisory given  Plan Discussed with: CRNA and Anesthesiologist  Anesthesia Plan Comments:         Anesthesia Quick Evaluation

## 2015-03-03 DIAGNOSIS — K8010 Calculus of gallbladder with chronic cholecystitis without obstruction: Secondary | ICD-10-CM | POA: Diagnosis not present

## 2015-03-03 LAB — GLUCOSE, CAPILLARY: Glucose-Capillary: 216 mg/dL — ABNORMAL HIGH (ref 65–99)

## 2015-03-03 NOTE — Discharge Summary (Signed)
Physician Discharge Summary  Patient ID: Amanda Cain MRN: [id number] DOB/AGE: [birth year] 69 y.o.  Admit date: 03/02/2015 Discharge date: 03/03/2015  Admission Diagnoses: s/p lap chole  Discharge Diagnoses:  Active Problems:   S/P laparoscopic cholecystectomy   Discharged Condition: good  Hospital Course: Patient is a 69 year old female status post laparoscopic cholecystectomy. Patient was admitted to the floor secondary to some hypoxemia might be secondary to abdominal pain. Patient was admitted to the floor with oxygen and continuous pulse ox as well as incentive spirometry. Patient did well with pain postoperatively after being admitted to the floor. Her oxygenation levels were 98-100% on room air. Patient was otherwise tolerating a liquid diet advanced to regular diet. Patient was infiltrated discharge and discharged home.  Consults: None  Significant Diagnostic Studies: none  Treatments: surgery: as above  Discharge Exam: Blood pressure 158/87, pulse 83, temperature 97.6 F (36.4 C), temperature source Oral, resp. rate 18, height 4\' 11"  (1.499 m), weight 107.956 kg (238 lb), SpO2 100 %. General appearance: alert and cooperative GI: soft, non-tender; bowel sounds normal; no masses,  no organomegaly  Disposition: ED Dismiss - Never Arrived  Discharge Instructions    Diet - low sodium heart healthy    Complete by:  As directed      Increase activity slowly    Complete by:  As directed             Medication List    STOP taking these medications        pantoprazole 40 MG tablet  Commonly known as:  PROTONIX     potassium chloride 10 MEQ tablet  Commonly known as:  K-DUR      TAKE these medications        acetaminophen 500 MG tablet  Commonly known as:  TYLENOL  Take 500 mg by mouth every 6 (six) hours as needed for mild pain.     atorvastatin 40 MG tablet  Commonly known as:  LIPITOR  Take 40 mg by mouth at bedtime.     Biotin 5000 MCG Caps  Take 5,000  mcg by mouth daily.     CALCIUM 1200 PO  Take 1,200 mg by mouth daily.     escitalopram 20 MG tablet  Commonly known as:  LEXAPRO  Take 20 mg by mouth at bedtime.     estradiol 0.5 MG tablet  Commonly known as:  ESTRACE  Take 0.5 mg by mouth every morning.     ferrous sulfate 325 (65 FE) MG tablet  Take 325 mg by mouth daily with breakfast.     FLAX SEEDS PO  Take 1 tablet by mouth daily.     fluticasone 50 MCG/ACT nasal spray  Commonly known as:  FLONASE  Place into both nostrils daily.     INVEGA SUSTENNA 156 MG/ML Susp injection  Generic drug:  paliperidone  Inject 156 mg into the muscle every 30 (thirty) days.     levothyroxine 125 MCG tablet  Commonly known as:  SYNTHROID, LEVOTHROID  Take 125 mcg by mouth daily before breakfast.     LORazepam 0.5 MG tablet  Commonly known as:  ATIVAN  Take 0.5 mg by mouth 2 (two) times daily.     metoprolol succinate 100 MG 24 hr tablet  Commonly known as:  TOPROL-XL  Take 100 mg by mouth every morning.     Misc Intestinal Flora Regulat Caps  Take 1 capsule by mouth 3 (three) times daily with meals.     multivitamin  with minerals Tabs tablet  Take 1 tablet by mouth daily.     olmesartan-hydrochlorothiazide 40-25 MG tablet  Commonly known as:  BENICAR HCT  Take 1 tablet by mouth every morning.     oxyCODONE-acetaminophen 5-325 MG tablet  Commonly known as:  ROXICET  Take 1-2 tablets by mouth every 4 (four) hours as needed.     sitaGLIPtin 50 MG tablet  Commonly known as:  JANUVIA  Take 50 mg by mouth daily.     sucralfate 1 G tablet  Commonly known as:  CARAFATE  Take 1 g by mouth 2 (two) times daily.     SYSTANE 0.4-0.3 % Soln  Generic drug:  Polyethyl Glycol-Propyl Glycol  Apply 1 drop to eye daily as needed (for dry eyes).     TAZTIA XT 240 MG 24 hr capsule  Generic drug:  diltiazem  Take 240 mg by mouth every morning.     vitamin B-12 1000 MCG tablet  Commonly known as:  CYANOCOBALAMIN  Take 1,000 mcg by  mouth daily.     vitamin C 500 MG tablet  Commonly known as:  ASCORBIC ACID  Take 500 mg by mouth daily.     vitamin E 400 UNIT capsule  Generic drug:  vitamin E  Take 400 Units by mouth daily.           Follow-up Information    Follow up with Reyes Ivan, MD. Schedule an appointment as soon as possible for a visit in 2 weeks.   Specialty:  General Surgery   Why:  For wound re-check   Contact information:   [address]  [place] [address] [phone]       Signed: Rosario Jacks., Anne Hahn 03/03/2015, 7:24 AM

## 2015-03-03 NOTE — Progress Notes (Signed)
Patient alert and oriented. Given discharge instructions and prescriptions. All questions answered.

## 2015-04-03 ENCOUNTER — Other Ambulatory Visit: Payer: Self-pay

## 2015-04-03 DIAGNOSIS — Z1231 Encounter for screening mammogram for malignant neoplasm of breast: Secondary | ICD-10-CM

## 2015-04-22 ENCOUNTER — Ambulatory Visit: Payer: Medicare Other | Admitting: Podiatry

## 2015-05-08 ENCOUNTER — Ambulatory Visit: Payer: Medicare Other

## 2015-05-12 ENCOUNTER — Ambulatory Visit: Payer: Medicare Other | Admitting: Podiatry

## 2015-05-20 ENCOUNTER — Encounter: Payer: Self-pay | Admitting: Podiatry

## 2015-05-20 ENCOUNTER — Ambulatory Visit (INDEPENDENT_AMBULATORY_CARE_PROVIDER_SITE_OTHER): Payer: Medicare Other | Admitting: Podiatry

## 2015-05-20 DIAGNOSIS — B351 Tinea unguium: Secondary | ICD-10-CM | POA: Diagnosis not present

## 2015-05-20 DIAGNOSIS — M79676 Pain in unspecified toe(s): Secondary | ICD-10-CM

## 2015-05-20 NOTE — Patient Instructions (Signed)
Diabetes and Foot Care Diabetes may cause you to have problems because of poor blood supply (circulation) to your feet and legs. This may cause the skin on your feet to become thinner, break easier, and heal more slowly. Your skin may become dry, and the skin may peel and crack. You may also have nerve damage in your legs and feet causing decreased feeling in them. You may not notice minor injuries to your feet that could lead to infections or more serious problems. Taking care of your feet is one of the most important things you can do for yourself.  HOME CARE INSTRUCTIONS  Wear shoes at all times, even in the house. Do not go barefoot. Bare feet are easily injured.  Check your feet daily for blisters, cuts, and redness. If you cannot see the bottom of your feet, use a mirror or ask someone for help.  Wash your feet with warm water (do not use hot water) and mild soap. Then pat your feet and the areas between your toes until they are completely dry. Do not soak your feet as this can dry your skin.  Apply a moisturizing lotion or petroleum jelly (that does not contain alcohol and is unscented) to the skin on your feet and to dry, brittle toenails. Do not apply lotion between your toes.  Trim your toenails straight across. Do not dig under them or around the cuticle. File the edges of your nails with an emery board or nail file.  Do not cut corns or calluses or try to remove them with medicine.  Wear clean socks or stockings every day. Make sure they are not too tight. Do not wear knee-high stockings since they may decrease blood flow to your legs.  Wear shoes that fit properly and have enough cushioning. To break in new shoes, wear them for just a few hours a day. This prevents you from injuring your feet. Always look in your shoes before you put them on to be sure there are no objects inside.  Do not cross your legs. This may decrease the blood flow to your feet.  If you find a minor scrape,  cut, or break in the skin on your feet, keep it and the skin around it clean and dry. These areas may be cleansed with mild soap and water. Do not cleanse the area with peroxide, alcohol, or iodine.  When you remove an adhesive bandage, be sure not to damage the skin around it.  If you have a wound, look at it several times a day to make sure it is healing.  Do not use heating pads or hot water bottles. They may burn your skin. If you have lost feeling in your feet or legs, you may not know it is happening until it is too late.  Make sure your health care provider performs a complete foot exam at least annually or more often if you have foot problems. Report any cuts, sores, or bruises to your health care provider immediately. SEEK MEDICAL CARE IF:   You have an injury that is not healing.  You have cuts or breaks in the skin.  You have an ingrown nail.  You notice redness on your legs or feet.  You feel burning or tingling in your legs or feet.  You have pain or cramps in your legs and feet.  Your legs or feet are numb.  Your feet always feel cold. SEEK IMMEDIATE MEDICAL CARE IF:   There is increasing redness,   swelling, or pain in or around a wound.  There is a red line that goes up your leg.  Pus is coming from a wound.  You develop a fever or as directed by your health care provider.  You notice a bad smell coming from an ulcer or wound.   This information is not intended to replace advice given to you by your health care provider. Make sure you discuss any questions you have with your health care provider.   Document Released: 05/13/2000 Document Revised: 01/16/2013 Document Reviewed: 10/23/2012 Elsevier Interactive Patient Education 2016 [employer]  

## 2015-05-21 NOTE — Progress Notes (Signed)
Patient ID: Amanda Cain, female   DOB: 08-Nov-1945, 69 y.o.   MRN: [id number]  Subjective: This patient presents today stating that her toenails that are thickened and elongated and are comfortable and she walking wearing shoes and requesting nail debridement  Objective: No open skin lesions bilaterally The toenails are hypertrophic, elongated, brittle, discolored and tender to direct palpation 6-10  Assessment: Symptomatic onychomycoses 6-10 Diabetic without complications  Plan: Debridement toenails 6-10 mechanically an electrical without any bleeding  Reappoint 3 months

## 2015-05-28 ENCOUNTER — Ambulatory Visit: Payer: Medicare Other

## 2015-06-30 ENCOUNTER — Ambulatory Visit
Admission: RE | Admit: 2015-06-30 | Discharge: 2015-06-30 | Disposition: A | Discharge status: Home / Self Care | Payer: Medicare Other | Source: Ambulatory Visit

## 2015-06-30 DIAGNOSIS — Z1231 Encounter for screening mammogram for malignant neoplasm of breast: Secondary | ICD-10-CM

## 2015-08-25 ENCOUNTER — Ambulatory Visit: Payer: Medicare Other | Admitting: Podiatry

## 2015-09-02 ENCOUNTER — Ambulatory Visit (INDEPENDENT_AMBULATORY_CARE_PROVIDER_SITE_OTHER): Payer: Medicare Other | Admitting: Podiatry

## 2015-09-02 ENCOUNTER — Encounter: Payer: Self-pay | Admitting: Podiatry

## 2015-09-02 DIAGNOSIS — B351 Tinea unguium: Secondary | ICD-10-CM | POA: Diagnosis not present

## 2015-09-02 DIAGNOSIS — M79676 Pain in unspecified toe(s): Secondary | ICD-10-CM | POA: Diagnosis not present

## 2015-09-02 NOTE — Patient Instructions (Signed)
Diabetes and Foot Care Diabetes may cause you to have problems because of poor blood supply (circulation) to your feet and legs. This may cause the skin on your feet to become thinner, break easier, and heal more slowly. Your skin may become dry, and the skin may peel and crack. You may also have nerve damage in your legs and feet causing decreased feeling in them. You may not notice minor injuries to your feet that could lead to infections or more serious problems. Taking care of your feet is one of the most important things you can do for yourself.  HOME CARE INSTRUCTIONS  Wear shoes at all times, even in the house. Do not go barefoot. Bare feet are easily injured.  Check your feet daily for blisters, cuts, and redness. If you cannot see the bottom of your feet, use a mirror or ask someone for help.  Wash your feet with warm water (do not use hot water) and mild soap. Then pat your feet and the areas between your toes until they are completely dry. Do not soak your feet as this can dry your skin.  Apply a moisturizing lotion or petroleum jelly (that does not contain alcohol and is unscented) to the skin on your feet and to dry, brittle toenails. Do not apply lotion between your toes.  Trim your toenails straight across. Do not dig under them or around the cuticle. File the edges of your nails with an emery board or nail file.  Do not cut corns or calluses or try to remove them with medicine.  Wear clean socks or stockings every day. Make sure they are not too tight. Do not wear knee-high stockings since they may decrease blood flow to your legs.  Wear shoes that fit properly and have enough cushioning. To break in new shoes, wear them for just a few hours a day. This prevents you from injuring your feet. Always look in your shoes before you put them on to be sure there are no objects inside.  Do not cross your legs. This may decrease the blood flow to your feet.  If you find a minor scrape,  cut, or break in the skin on your feet, keep it and the skin around it clean and dry. These areas may be cleansed with mild soap and water. Do not cleanse the area with peroxide, alcohol, or iodine.  When you remove an adhesive bandage, be sure not to damage the skin around it.  If you have a wound, look at it several times a day to make sure it is healing.  Do not use heating pads or hot water bottles. They may burn your skin. If you have lost feeling in your feet or legs, you may not know it is happening until it is too late.  Make sure your health care provider performs a complete foot exam at least annually or more often if you have foot problems. Report any cuts, sores, or bruises to your health care provider immediately. SEEK MEDICAL CARE IF:   You have an injury that is not healing.  You have cuts or breaks in the skin.  You have an ingrown nail.  You notice redness on your legs or feet.  You feel burning or tingling in your legs or feet.  You have pain or cramps in your legs and feet.  Your legs or feet are numb.  Your feet always feel cold. SEEK IMMEDIATE MEDICAL CARE IF:   There is increasing redness,   swelling, or pain in or around a wound.  There is a red line that goes up your leg.  Pus is coming from a wound.  You develop a fever or as directed by your health care provider.  You notice a bad smell coming from an ulcer or wound.   This information is not intended to replace advice given to you by your health care provider. Make sure you discuss any questions you have with your health care provider.   Document Released: 05/13/2000 Document Revised: 01/16/2013 Document Reviewed: 10/23/2012 Elsevier Interactive Patient Education 2016 [employer]  

## 2015-09-02 NOTE — Progress Notes (Signed)
Patient ID: Amanda Cain, female   DOB: 08-18-1945, 70 y.o.   MRN: [id number]   Subjective: This patient presents today stating that her toenails that are thickened and elongated and are comfortable and she walking wearing shoes and requesting nail debridement  Objective: No open skin lesions bilaterally The toenails are hypertrophic, elongated, brittle, discolored and tender to direct palpation 6-10 DP and PT pulses 2/4 bilaterally Capillary reflex immediate bilaterally Sensation to 10 g monofilament wire intact 5/5 bilaterally Vibratory sensation equal and reactive bilaterally Ankle reflex equal and reactive bilaterally Pes planus bilaterally HAV deformities bilaterally  Assessment: Symptomatic onychomycoses 6-10 Diabetic without complications  Plan: Debridement toenails 6-10 mechanically an electrical without any bleeding  Reappoint 3 months

## 2015-12-02 ENCOUNTER — Encounter: Payer: Self-pay | Admitting: Podiatry

## 2015-12-02 ENCOUNTER — Ambulatory Visit (INDEPENDENT_AMBULATORY_CARE_PROVIDER_SITE_OTHER): Payer: Medicare Other | Admitting: Podiatry

## 2015-12-02 DIAGNOSIS — B351 Tinea unguium: Secondary | ICD-10-CM

## 2015-12-02 DIAGNOSIS — M79676 Pain in unspecified toe(s): Secondary | ICD-10-CM

## 2015-12-02 NOTE — Patient Instructions (Signed)
Diabetes and Foot Care Diabetes may cause you to have problems because of poor blood supply (circulation) to your feet and legs. This may cause the skin on your feet to become thinner, break easier, and heal more slowly. Your skin may become dry, and the skin may peel and crack. You may also have nerve damage in your legs and feet causing decreased feeling in them. You may not notice minor injuries to your feet that could lead to infections or more serious problems. Taking care of your feet is one of the most important things you can do for yourself.  HOME CARE INSTRUCTIONS  Wear shoes at all times, even in the house. Do not go barefoot. Bare feet are easily injured.  Check your feet daily for blisters, cuts, and redness. If you cannot see the bottom of your feet, use a mirror or ask someone for help.  Wash your feet with warm water (do not use hot water) and mild soap. Then pat your feet and the areas between your toes until they are completely dry. Do not soak your feet as this can dry your skin.  Apply a moisturizing lotion or petroleum jelly (that does not contain alcohol and is unscented) to the skin on your feet and to dry, brittle toenails. Do not apply lotion between your toes.  Trim your toenails straight across. Do not dig under them or around the cuticle. File the edges of your nails with an emery board or nail file.  Do not cut corns or calluses or try to remove them with medicine.  Wear clean socks or stockings every day. Make sure they are not too tight. Do not wear knee-high stockings since they may decrease blood flow to your legs.  Wear shoes that fit properly and have enough cushioning. To break in new shoes, wear them for just a few hours a day. This prevents you from injuring your feet. Always look in your shoes before you put them on to be sure there are no objects inside.  Do not cross your legs. This may decrease the blood flow to your feet.  If you find a minor scrape,  cut, or break in the skin on your feet, keep it and the skin around it clean and dry. These areas may be cleansed with mild soap and water. Do not cleanse the area with peroxide, alcohol, or iodine.  When you remove an adhesive bandage, be sure not to damage the skin around it.  If you have a wound, look at it several times a day to make sure it is healing.  Do not use heating pads or hot water bottles. They may burn your skin. If you have lost feeling in your feet or legs, you may not know it is happening until it is too late.  Make sure your health care provider performs a complete foot exam at least annually or more often if you have foot problems. Report any cuts, sores, or bruises to your health care provider immediately. SEEK MEDICAL CARE IF:   You have an injury that is not healing.  You have cuts or breaks in the skin.  You have an ingrown nail.  You notice redness on your legs or feet.  You feel burning or tingling in your legs or feet.  You have pain or cramps in your legs and feet.  Your legs or feet are numb.  Your feet always feel cold. SEEK IMMEDIATE MEDICAL CARE IF:   There is increasing redness,   swelling, or pain in or around a wound.  There is a red line that goes up your leg.  Pus is coming from a wound.  You develop a fever or as directed by your health care provider.  You notice a bad smell coming from an ulcer or wound.   This information is not intended to replace advice given to you by your health care provider. Make sure you discuss any questions you have with your health care provider.   Document Released: 05/13/2000 Document Revised: 01/16/2013 Document Reviewed: 10/23/2012 Elsevier Interactive Patient Education 2016 [employer]  

## 2015-12-03 NOTE — Progress Notes (Signed)
Patient ID: Amanda Cain, female   DOB: November 15, 1945, 70 y.o.   MRN: [id number]   Subjective: This patient presents today stating that her toenails that are thickened and elongated and are comfortable and she walking wearing shoes and requesting nail debridement  Objective: No open skin lesions bilaterally The toenails are hypertrophic, elongated, brittle, discolored and tender to direct palpation 6-10 DP and PT pulses 2/4 bilaterally Capillary reflex immediate bilaterally Sensation to 10 g monofilament wire intact 5/5 bilaterally Vibratory sensation equal and reactive bilaterally Ankle reflex equal and reactive bilaterally Pes planus bilaterally HAV deformities bilaterally  Assessment: Symptomatic onychomycoses 6-10 Diabetic without complications  Plan: Debridement toenails 6-10 mechanically an electrical without any bleeding  Reappoint 3 months

## 2016-02-03 ENCOUNTER — Other Ambulatory Visit: Payer: Self-pay | Admitting: Family Medicine

## 2016-02-03 DIAGNOSIS — R221 Localized swelling, mass and lump, neck: Secondary | ICD-10-CM

## 2016-02-09 ENCOUNTER — Other Ambulatory Visit: Payer: Medicare Other

## 2016-02-10 ENCOUNTER — Ambulatory Visit: Payer: Medicare Other | Admitting: Podiatry

## 2016-02-11 ENCOUNTER — Ambulatory Visit
Admission: RE | Admit: 2016-02-11 | Discharge: 2016-02-11 | Disposition: A | Discharge status: Home / Self Care | Payer: Medicare Other | Source: Ambulatory Visit | Attending: Family Medicine | Admitting: Family Medicine

## 2016-02-11 DIAGNOSIS — R221 Localized swelling, mass and lump, neck: Secondary | ICD-10-CM

## 2016-02-15 ENCOUNTER — Other Ambulatory Visit: Payer: Self-pay | Admitting: Family Medicine

## 2016-02-15 DIAGNOSIS — R221 Localized swelling, mass and lump, neck: Secondary | ICD-10-CM

## 2016-02-18 ENCOUNTER — Other Ambulatory Visit: Payer: Medicare Other

## 2016-02-19 ENCOUNTER — Ambulatory Visit
Admission: RE | Admit: 2016-02-19 | Discharge: 2016-02-19 | Disposition: A | Discharge status: Home / Self Care | Payer: Medicare Other | Source: Ambulatory Visit | Attending: Family Medicine | Admitting: Family Medicine

## 2016-02-19 ENCOUNTER — Other Ambulatory Visit: Payer: Self-pay | Admitting: Family Medicine

## 2016-02-19 DIAGNOSIS — R221 Localized swelling, mass and lump, neck: Secondary | ICD-10-CM

## 2016-02-24 ENCOUNTER — Ambulatory Visit (INDEPENDENT_AMBULATORY_CARE_PROVIDER_SITE_OTHER): Payer: Medicare Other | Admitting: Podiatry

## 2016-02-24 ENCOUNTER — Encounter: Payer: Self-pay | Admitting: Podiatry

## 2016-02-24 DIAGNOSIS — M79676 Pain in unspecified toe(s): Secondary | ICD-10-CM

## 2016-02-24 DIAGNOSIS — B351 Tinea unguium: Secondary | ICD-10-CM | POA: Diagnosis not present

## 2016-02-24 NOTE — Patient Instructions (Signed)
Diabetes and Foot Care Diabetes may cause you to have problems because of poor blood supply (circulation) to your feet and legs. This may cause the skin on your feet to become thinner, break easier, and heal more slowly. Your skin may become dry, and the skin may peel and crack. You may also have nerve damage in your legs and feet causing decreased feeling in them. You may not notice minor injuries to your feet that could lead to infections or more serious problems. Taking care of your feet is one of the most important things you can do for yourself.  HOME CARE INSTRUCTIONS  Wear shoes at all times, even in the house. Do not go barefoot. Bare feet are easily injured.  Check your feet daily for blisters, cuts, and redness. If you cannot see the bottom of your feet, use a mirror or ask someone for help.  Wash your feet with warm water (do not use hot water) and mild soap. Then pat your feet and the areas between your toes until they are completely dry. Do not soak your feet as this can dry your skin.  Apply a moisturizing lotion or petroleum jelly (that does not contain alcohol and is unscented) to the skin on your feet and to dry, brittle toenails. Do not apply lotion between your toes.  Trim your toenails straight across. Do not dig under them or around the cuticle. File the edges of your nails with an emery board or nail file.  Do not cut corns or calluses or try to remove them with medicine.  Wear clean socks or stockings every day. Make sure they are not too tight. Do not wear knee-high stockings since they may decrease blood flow to your legs.  Wear shoes that fit properly and have enough cushioning. To break in new shoes, wear them for just a few hours a day. This prevents you from injuring your feet. Always look in your shoes before you put them on to be sure there are no objects inside.  Do not cross your legs. This may decrease the blood flow to your feet.  If you find a minor scrape,  cut, or break in the skin on your feet, keep it and the skin around it clean and dry. These areas may be cleansed with mild soap and water. Do not cleanse the area with peroxide, alcohol, or iodine.  When you remove an adhesive bandage, be sure not to damage the skin around it.  If you have a wound, look at it several times a day to make sure it is healing.  Do not use heating pads or hot water bottles. They may burn your skin. If you have lost feeling in your feet or legs, you may not know it is happening until it is too late.  Make sure your health care provider performs a complete foot exam at least annually or more often if you have foot problems. Report any cuts, sores, or bruises to your health care provider immediately. SEEK MEDICAL CARE IF:   You have an injury that is not healing.  You have cuts or breaks in the skin.  You have an ingrown nail.  You notice redness on your legs or feet.  You feel burning or tingling in your legs or feet.  You have pain or cramps in your legs and feet.  Your legs or feet are numb.  Your feet always feel cold. SEEK IMMEDIATE MEDICAL CARE IF:   There is increasing redness,   swelling, or pain in or around a wound.  There is a red line that goes up your leg.  Pus is coming from a wound.  You develop a fever or as directed by your health care provider.  You notice a bad smell coming from an ulcer or wound.   This information is not intended to replace advice given to you by your health care provider. Make sure you discuss any questions you have with your health care provider.   Document Released: 05/13/2000 Document Revised: 01/16/2013 Document Reviewed: 10/23/2012 Elsevier Interactive Patient Education 2016 [employer]  

## 2016-02-24 NOTE — Progress Notes (Signed)
Patient ID: Amanda Cain, female   DOB: 27-Nov-1945, 70 y.o.   MRN: [id number]     Subjective: This patient presents today stating that her toenails that are thickened and elongated and are comfortable and she walking wearing shoes and requesting nail debridement  Objective: No open skin lesions bilaterally The toenails are hypertrophic, elongated, brittle, discolored and tender to direct palpation 6-10 DP and PT pulses 2/4 bilaterally Capillary reflex immediate bilaterally Sensation to 10 g monofilament wire intact 5/5 bilaterally Vibratory sensation equal and reactive bilaterally Ankle reflex equal and reactive bilaterally Pes planus bilaterally HAV deformities bilaterally  Assessment: Symptomatic onychomycoses 6-10 Diabetic without complications  Plan: Debridement toenails 6-10 mechanically an electrical without any bleeding  Reappoint 3 months

## 2016-03-01 ENCOUNTER — Ambulatory Visit: Payer: Medicare Other | Admitting: Podiatry

## 2016-04-07 ENCOUNTER — Ambulatory Visit: Payer: Self-pay | Admitting: General Surgery

## 2016-04-07 NOTE — H&P (Signed)
History of Present Illness The patient is a 70 year old female who presents with a complaint of Mass. Pt is a 70 y/o F with a R lateral neck mass. Patient states that she's noticed some discomfort and pain over the last several months. She does feel it is getting larger. Patient had no drainage or erythema from the area.  Patient was CT scan which revealed: IMPRESSION: Palpable abnormality corresponds to a lipoma posterior to the sternocleidomastoid muscle which appears benign       Problem List/Past Medical  GALLSTONES (K80.20)  Other Problems POST-OPERATIVE STATE (Z98.890)03/16/2015 Gastric Ulcer Heart murmur Hepatitis High blood pressure Hypercholesterolemia Inguinal Hernia Thyroid Cancer Thyroid Disease Transfusion history  Past Surgical History ( Appendectomy Breast Biopsy Bilateral. Breast Mass; Local Excision Bilateral. Hysterectomy (not due to cancer) - Partial Knee Surgery Bilateral. Mammoplasty; Reduction Bilateral. Oral Surgery Thyroid Surgery  Diagnostic Studies History  Mammogram within last year Pap Smear 1-5 years ago  Allergies  Aspirin *ANALGESICS - NonNarcotic*  Medication History ( LORazepam (0.5MG  Tablet, Oral) Active. Promethazine-Codeine (6.25-10MG /5ML Syrup, Oral) Active. Atorvastatin Calcium (40MG  Tablet, Oral) Active. Benicar HCT (40-25MG  Tablet, Oral) Active. Dilt-XR (120MG  Capsule ER 24HR, Oral) Active. Estradiol (0.5MG  Tablet, Oral) Active. Fluticasone Propionate (50MCG/ACT Suspension, Nasal) Active. Januvia (50MG  Tablet, Oral) Active. Levothyroxine Sodium (125MCG Tablet, Oral) Active. Metoprolol Succinate ER (100MG  Tablet ER 24HR, Oral) Active. OneTouch Ultra Blue (In Vitro) Active. OneTouch Delica Lancets Fine Active. Pantoprazole Sodium (40MG  Tablet DR, Oral) Active. Potassium Chloride ER (10MEQ Capsule ER, Oral) Active. Sucralfate (1GM Tablet, Oral) Active. Escitalopram Oxalate (20MG   Tablet, Oral) Active. Lorayne Bender Sustenna (156MG /ML Suspension, Intramuscular) Active. Medications Reconciled  Social History  No caffeine use Tobacco use Never smoker.  Family History  Arthritis Father. Bleeding disorder Family Members In General. Colon Polyps Family Members In General. Depression Family Members In General.  Pregnancy / Birth History ( Age at menarche 32 years. Age of menopause 79-50 Maternal age 37-25 Para 0  Vitals ( 04/07/2016 9:43 AM Weight: 232.5 lb Height: 59in Body Surface Area: 1.97 m Body Mass Index: 46.96 kg/m  BP: 132/80 (Sitting, Left Arm, Standard)       Physical Exam  General Mental Status-Alert. General Appearance-Consistent with stated age. Hydration-Well hydrated. Voice-Normal.  Head and Neck Note: Right posterior sternocleidomastoid mass, palpable, nontender to palpation, nonerythematous, approximately 2-3 cm in size   Chest and Lung Exam Chest and lung exam reveals -quiet, even and easy respiratory effort with no use of accessory muscles and on auscultation, normal breath sounds, no adventitious sounds and normal vocal resonance. Inspection Chest Wall - Normal. Back - normal.  Cardiovascular Cardiovascular examination reveals -normal heart sounds, regular rate and rhythm with no murmurs and normal pedal pulses bilaterally.  Abdomen Inspection Inspection of the abdomen reveals - No Hernias. Skin - Scar - no surgical scars. Palpation/Percussion Palpation and Percussion of the abdomen reveal - Soft, Non Tender, No Rebound tenderness, No Rigidity (guarding) and No hepatosplenomegaly. Auscultation Auscultation of the abdomen reveals - Bowel sounds normal.  Neurologic Neurologic evaluation reveals -alert and oriented x 3 with no impairment of recent or remote memory. Mental Status-Normal.  Musculoskeletal Normal Exam - Left-Upper Extremity Strength Normal and Lower Extremity Strength  Normal. Normal Exam - Right-Upper Extremity Strength Normal and Lower Extremity Strength Normal.    Assessment & Plan MASS OF LATERAL NECK (R22.1) Impression: 70 year old female with a right lateral neck mass. This appears to be consistent with a benign fatty mass.  1. The patient will like to proceed to  the operating room for excision of mass 2. I discussed with her the risks and benefits of the procedure to include but not limited to: Infection, bleeding, damages running structures, possible recurrence, possible wound healing issues. The patient was understanding and wishes to proceed.

## 2016-05-25 ENCOUNTER — Ambulatory Visit: Payer: Medicare Other | Admitting: Podiatry

## 2016-06-02 ENCOUNTER — Inpatient Hospital Stay (HOSPITAL_COMMUNITY): Admission: RE | Admit: 2016-06-02 | Payer: Medicare Other | Source: Ambulatory Visit

## 2016-06-21 ENCOUNTER — Ambulatory Visit (INDEPENDENT_AMBULATORY_CARE_PROVIDER_SITE_OTHER): Payer: Medicare Other | Admitting: Podiatry

## 2016-06-21 ENCOUNTER — Encounter: Payer: Self-pay | Admitting: Podiatry

## 2016-06-21 VITALS — BP 156/68 | HR 73 | Resp 16

## 2016-06-21 DIAGNOSIS — B351 Tinea unguium: Secondary | ICD-10-CM

## 2016-06-21 DIAGNOSIS — M79676 Pain in unspecified toe(s): Secondary | ICD-10-CM | POA: Diagnosis not present

## 2016-06-21 NOTE — Progress Notes (Signed)
Patient ID: Amanda Cain, female   DOB: Jun 19, 1945, 71 y.o.   MRN: [id number]    Subjective: This patient presents today stating that her toenails that are thickened and elongated and are comfortable and she walking wearing shoes and requesting nail debridement  Objective: Orientated 3 No open skin lesions bilaterally The toenails are hypertrophic, elongated, brittle, discolored and tender to direct palpation 6-10 DP and PT pulses 2/4 bilaterally Capillary reflex immediate bilaterally Sensation to 10 g monofilament wire intact 5/5 bilaterally Vibratory sensation equal and reactive bilaterally Ankle reflex equal and reactive bilaterally Pes planus bilaterally HAV deformities bilaterally  Assessment: Symptomatic onychomycoses 6-10 Diabetic without complications  Plan: Debridement toenails 6-10 mechanically an electrically without any bleeding  Reappoint 3 months

## 2016-06-21 NOTE — Patient Instructions (Signed)

## 2016-07-15 ENCOUNTER — Ambulatory Visit: Payer: Self-pay | Admitting: General Surgery

## 2016-07-15 NOTE — Progress Notes (Signed)
Need pre op orders in epic for 07-21-16 surgery pre op is 07-18-16 at 900 am . thanks

## 2016-07-15 NOTE — Patient Instructions (Addendum)
Amanda Cain  07/15/2016   Your procedure is scheduled on: 07-21-16  Report to [facility]  Entrance take [facility]  elevators to 3rd floor to  [place] at 530  AM.  Call this number if you have problems the morning of surgery [phone]   Remember: ONLY 1 PERSON MAY GO WITH YOU TO SHORT STAY TO GET  READY MORNING OF [place].  Do not eat food or drink liquids :After Midnight.     Take these medicines the morning of surgery with A SIP OF WATER: ALBUTEROL INHALER IF NEEDED AND BRING INHALER WITH YOU, DILTIAZEM (CARDIZEM), FLONASE NASAL SPRAY as needed, LEVOTHYROXINE (SYNTHROID), METOPROLOL SUCCINATE (TOPROL -XL), EYE DROP , SYMBICORT  DO NOT TAKE ANY DIABETIC MEDICATIONS DAY OF YOUR SURGERY                               You may not have any metal on your body including hair pins and              piercings  Do not wear jewelry, make-up, lotions, powders or perfumes, deodorant             Do not wear nail polish.  Do not shave  48 hours prior to surgery.                Do not bring valuables to the hospital. [place].  Contacts, dentures or bridgework may not be worn into surgery.  Leave suitcase in the car. After surgery it may be brought to your room.     Patients discharged the day of surgery will not be allowed to drive home.  Name and phone number of your driver:  Special Instructions: N/A              Please read over the following fact sheets you were given: _____________________________________________________________________             How to Manage Your Diabetes Before and After Surgery  Why is it important to control my blood sugar before and after surgery? . Improving blood sugar levels before and after surgery helps healing and can limit problems. . A way of improving blood sugar control is eating a healthy diet by: o  Eating less sugar and carbohydrates o   Increasing activity/exercise o  Talking with your doctor about reaching your blood sugar goals . High blood sugars (greater than 180 mg/dL) can raise your risk of infections and slow your recovery, so you will need to focus on controlling your diabetes during the weeks before surgery. . Make sure that the doctor who takes care of your diabetes knows about your planned surgery including the date and location.  How do I manage my blood sugar before surgery? . Check your blood sugar at least 4 times a day, starting 2 days before surgery, to make sure that the level is not too high or low. o Check your blood sugar the morning of your surgery when you wake up and every 2 hours until you get to the Short Stay unit. . If your blood sugar is less than 70 mg/dL, you will need to  treat for low blood sugar: o Do not take insulin. o Treat a low blood sugar (less than 70 mg/dL) with  cup of clear juice (cranberry or apple), 4 glucose tablets, OR glucose gel. o Recheck blood sugar in 15 minutes after treatment (to make sure it is greater than 70 mg/dL). If your blood sugar is not greater than 70 mg/dL on recheck, call [phone] for further instructions. . Report your blood sugar to the short stay nurse when you get to Short Stay.  . If you are admitted to the hospital after surgery: o Your blood sugar will be checked by the staff and you will probably be given insulin after surgery (instead of oral diabetes medicines) to make sure you have good blood sugar levels. o The goal for blood sugar control after surgery is 80-180 mg/dL.   WHAT DO I DO ABOUT MY DIABETES MEDICATION?  YOU MAY TAKE YOUR JANUVIA AS USUAL DAY BEFORE SURGERY ON 07-20-16 DO NOT TAKE ANY Tabiona ON DAY OF SURGERY 07-21-16 .   Patient Signature:  Date:   Nurse Signature:  Date:   Reviewed and Endorsed by [facility] Patient Education Committee, August 2015  [facility] - Preparing for Surgery Before surgery, you can play an  important role.  Because skin is not sterile, your skin needs to be as free of germs as possible.  You can reduce the number of germs on your skin by washing with CHG (chlorahexidine gluconate) soap before surgery.  CHG is an antiseptic cleaner which kills germs and bonds with the skin to continue killing germs even after washing. Please DO NOT use if you have an allergy to CHG or antibacterial soaps.  If your skin becomes reddened/irritated stop using the CHG and inform your nurse when you arrive at Short Stay. Do not shave (including legs and underarms) for at least 48 hours prior to the first CHG shower.  You may shave your face/neck. Please follow these instructions carefully:  1.  Shower with CHG Soap the night before surgery and the  morning of Surgery.  2.  If you choose to wash your hair, wash your hair first as usual with your  normal  shampoo.  3.  After you shampoo, rinse your hair and body thoroughly to remove the  shampoo.                           4.  Use CHG as you would any other liquid soap.  You can apply chg directly  to the skin and wash                       Gently with a scrungie or clean washcloth.  5.  Apply the CHG Soap to your body ONLY FROM THE NECK DOWN.   Do not use on face/ open                           Wound or open sores. Avoid contact with eyes, ears mouth and genitals (private parts).                       Wash face,  Genitals (private parts) with your normal soap.             6.  Wash thoroughly, paying special attention to the area where your surgery  will be performed.  7.  Thoroughly  rinse your body with warm water from the neck down.  8.  DO NOT shower/wash with your normal soap after using and rinsing off  the CHG Soap.                9.  Pat yourself dry with a clean towel.            10.  Wear clean pajamas.            11.  Place clean sheets on your bed the night of your first shower and do not  sleep with pets. Day of Surgery : Do not apply any  lotions/deodorants the morning of surgery.  Please wear clean clothes to the hospital/surgery center.    I

## 2016-07-18 ENCOUNTER — Encounter (HOSPITAL_COMMUNITY): Payer: Self-pay | Admitting: Emergency Medicine

## 2016-07-18 ENCOUNTER — Encounter (HOSPITAL_COMMUNITY)
Admission: RE | Admit: 2016-07-18 | Discharge: 2016-07-18 | Disposition: A | Discharge status: Home / Self Care | Payer: Medicare Other | Source: Ambulatory Visit | Attending: General Surgery | Admitting: General Surgery

## 2016-07-18 DIAGNOSIS — E119 Type 2 diabetes mellitus without complications: Secondary | ICD-10-CM | POA: Diagnosis not present

## 2016-07-18 DIAGNOSIS — Z01818 Encounter for other preprocedural examination: Secondary | ICD-10-CM | POA: Diagnosis present

## 2016-07-18 DIAGNOSIS — R001 Bradycardia, unspecified: Secondary | ICD-10-CM | POA: Insufficient documentation

## 2016-07-18 DIAGNOSIS — Z01812 Encounter for preprocedural laboratory examination: Secondary | ICD-10-CM | POA: Diagnosis not present

## 2016-07-18 DIAGNOSIS — R221 Localized swelling, mass and lump, neck: Secondary | ICD-10-CM | POA: Insufficient documentation

## 2016-07-18 DIAGNOSIS — Z85850 Personal history of malignant neoplasm of thyroid: Secondary | ICD-10-CM | POA: Diagnosis not present

## 2016-07-18 DIAGNOSIS — E079 Disorder of thyroid, unspecified: Secondary | ICD-10-CM | POA: Diagnosis not present

## 2016-07-18 DIAGNOSIS — I10 Essential (primary) hypertension: Secondary | ICD-10-CM | POA: Diagnosis not present

## 2016-07-18 DIAGNOSIS — D170 Benign lipomatous neoplasm of skin and subcutaneous tissue of head, face and neck: Secondary | ICD-10-CM | POA: Diagnosis not present

## 2016-07-18 DIAGNOSIS — F329 Major depressive disorder, single episode, unspecified: Secondary | ICD-10-CM | POA: Diagnosis not present

## 2016-07-18 DIAGNOSIS — R9431 Abnormal electrocardiogram [ECG] [EKG]: Secondary | ICD-10-CM | POA: Diagnosis not present

## 2016-07-18 DIAGNOSIS — E7800 Pure hypercholesterolemia, unspecified: Secondary | ICD-10-CM | POA: Diagnosis not present

## 2016-07-18 DIAGNOSIS — F209 Schizophrenia, unspecified: Secondary | ICD-10-CM | POA: Diagnosis not present

## 2016-07-18 DIAGNOSIS — F419 Anxiety disorder, unspecified: Secondary | ICD-10-CM | POA: Diagnosis not present

## 2016-07-18 DIAGNOSIS — Z79899 Other long term (current) drug therapy: Secondary | ICD-10-CM | POA: Diagnosis not present

## 2016-07-18 DIAGNOSIS — Z79890 Hormone replacement therapy (postmenopausal): Secondary | ICD-10-CM | POA: Diagnosis not present

## 2016-07-18 DIAGNOSIS — E890 Postprocedural hypothyroidism: Secondary | ICD-10-CM | POA: Diagnosis not present

## 2016-07-18 DIAGNOSIS — Z7951 Long term (current) use of inhaled steroids: Secondary | ICD-10-CM | POA: Diagnosis not present

## 2016-07-18 DIAGNOSIS — J45909 Unspecified asthma, uncomplicated: Secondary | ICD-10-CM | POA: Diagnosis not present

## 2016-07-18 DIAGNOSIS — Z6841 Body Mass Index (BMI) 40.0 and over, adult: Secondary | ICD-10-CM | POA: Diagnosis not present

## 2016-07-18 LAB — CBC WITH DIFFERENTIAL/PLATELET
Basophils Absolute: 0 10*3/uL (ref 0.0–0.1)
Basophils Relative: 0 %
Eosinophils Absolute: 0.2 10*3/uL (ref 0.0–0.7)
Eosinophils Relative: 2 %
HCT: 34.1 % — ABNORMAL LOW (ref 36.0–46.0)
Hemoglobin: 10.8 g/dL — ABNORMAL LOW (ref 12.0–15.0)
Lymphocytes Relative: 11 %
Lymphs Abs: 1.1 10*3/uL (ref 0.7–4.0)
MCH: 27.2 pg (ref 26.0–34.0)
MCHC: 31.7 g/dL (ref 30.0–36.0)
MCV: 85.9 fL (ref 78.0–100.0)
Monocytes Absolute: 0.6 10*3/uL (ref 0.1–1.0)
Monocytes Relative: 7 %
Neutro Abs: 7.5 10*3/uL (ref 1.7–7.7)
Neutrophils Relative %: 80 %
Platelets: 307 10*3/uL (ref 150–400)
RBC: 3.97 MIL/uL (ref 3.87–5.11)
RDW: 15.1 % (ref 11.5–15.5)
WBC: 9.4 10*3/uL (ref 4.0–10.5)

## 2016-07-18 LAB — BASIC METABOLIC PANEL
Anion gap: 9 (ref 5–15)
BUN: 20 mg/dL (ref 6–20)
CO2: 31 mmol/L (ref 22–32)
Calcium: 9.8 mg/dL (ref 8.9–10.3)
Chloride: 96 mmol/L — ABNORMAL LOW (ref 101–111)
Creatinine, Ser: 1.5 mg/dL — ABNORMAL HIGH (ref 0.44–1.00)
GFR calc Af Amer: 39 mL/min — ABNORMAL LOW (ref >60)
GFR calc non Af Amer: 34 mL/min — ABNORMAL LOW (ref >60)
Glucose, Bld: 129 mg/dL — ABNORMAL HIGH (ref 65–99)
Potassium: 3.9 mmol/L (ref 3.5–5.1)
Sodium: 136 mmol/L (ref 135–145)

## 2016-07-18 LAB — GLUCOSE, CAPILLARY: Glucose-Capillary: 141 mg/dL — ABNORMAL HIGH (ref 65–99)

## 2016-07-18 NOTE — Progress Notes (Signed)
   07/18/16 0932  OBSTRUCTIVE SLEEP APNEA  Have you ever been diagnosed with sleep apnea through a sleep study? No  Do you snore loudly (loud enough to be heard through closed doors)?  1  Do you often feel tired, fatigued, or sleepy during the daytime (such as falling asleep during driving or talking to someone)? 1  Has anyone observed you stop breathing during your sleep? 0  Do you have, or are you being treated for high blood pressure? 1  BMI more than 35 kg/m2? 1  Age > 50 (1-yes) 1  Neck circumference greater than:Female 16 inches or larger, Female 17inches or larger? 1  Female Gender (Yes=1) 0  Obstructive Sleep Apnea Score 6

## 2016-07-18 NOTE — Progress Notes (Signed)
BMP results routed via epic to Dr Rosendo Gros in basket

## 2016-07-19 LAB — HEMOGLOBIN A1C
Hgb A1c MFr Bld: 6.1 % — ABNORMAL HIGH (ref 4.8–5.6)
Mean Plasma Glucose: 128 mg/dL

## 2016-07-21 ENCOUNTER — Encounter (HOSPITAL_COMMUNITY)
Admission: RE | Disposition: A | Discharge status: Home / Self Care | Payer: Self-pay | Source: Ambulatory Visit | Attending: General Surgery

## 2016-07-21 ENCOUNTER — Ambulatory Visit (HOSPITAL_COMMUNITY)
Admission: RE | Admit: 2016-07-21 | Discharge: 2016-07-21 | Disposition: A | Discharge status: Home / Self Care | Payer: Medicare Other | Source: Ambulatory Visit | Attending: General Surgery | Admitting: General Surgery

## 2016-07-21 ENCOUNTER — Ambulatory Visit (HOSPITAL_COMMUNITY): Payer: Medicare Other | Admitting: Anesthesiology

## 2016-07-21 ENCOUNTER — Encounter (HOSPITAL_COMMUNITY): Payer: Self-pay

## 2016-07-21 DIAGNOSIS — E7800 Pure hypercholesterolemia, unspecified: Secondary | ICD-10-CM | POA: Insufficient documentation

## 2016-07-21 DIAGNOSIS — Z79899 Other long term (current) drug therapy: Secondary | ICD-10-CM | POA: Insufficient documentation

## 2016-07-21 DIAGNOSIS — E079 Disorder of thyroid, unspecified: Secondary | ICD-10-CM | POA: Diagnosis not present

## 2016-07-21 DIAGNOSIS — D170 Benign lipomatous neoplasm of skin and subcutaneous tissue of head, face and neck: Secondary | ICD-10-CM | POA: Diagnosis not present

## 2016-07-21 DIAGNOSIS — Z79890 Hormone replacement therapy (postmenopausal): Secondary | ICD-10-CM | POA: Insufficient documentation

## 2016-07-21 DIAGNOSIS — F419 Anxiety disorder, unspecified: Secondary | ICD-10-CM | POA: Insufficient documentation

## 2016-07-21 DIAGNOSIS — Z85850 Personal history of malignant neoplasm of thyroid: Secondary | ICD-10-CM | POA: Insufficient documentation

## 2016-07-21 DIAGNOSIS — J45909 Unspecified asthma, uncomplicated: Secondary | ICD-10-CM | POA: Insufficient documentation

## 2016-07-21 DIAGNOSIS — Z7951 Long term (current) use of inhaled steroids: Secondary | ICD-10-CM | POA: Insufficient documentation

## 2016-07-21 DIAGNOSIS — F329 Major depressive disorder, single episode, unspecified: Secondary | ICD-10-CM | POA: Insufficient documentation

## 2016-07-21 DIAGNOSIS — E119 Type 2 diabetes mellitus without complications: Secondary | ICD-10-CM | POA: Insufficient documentation

## 2016-07-21 DIAGNOSIS — I10 Essential (primary) hypertension: Secondary | ICD-10-CM | POA: Diagnosis not present

## 2016-07-21 DIAGNOSIS — E890 Postprocedural hypothyroidism: Secondary | ICD-10-CM | POA: Insufficient documentation

## 2016-07-21 DIAGNOSIS — F209 Schizophrenia, unspecified: Secondary | ICD-10-CM | POA: Insufficient documentation

## 2016-07-21 DIAGNOSIS — Z6841 Body Mass Index (BMI) 40.0 and over, adult: Secondary | ICD-10-CM | POA: Insufficient documentation

## 2016-07-21 HISTORY — PX: EXCISION MASS NECK: SHX6703

## 2016-07-21 LAB — GLUCOSE, CAPILLARY
Glucose-Capillary: 159 mg/dL — ABNORMAL HIGH (ref 65–99)
Glucose-Capillary: 153 mg/dL — ABNORMAL HIGH (ref 65–99)

## 2016-07-21 SURGERY — EXCISION, MASS, NECK
Anesthesia: General | Laterality: Right

## 2016-07-21 MED ORDER — ONDANSETRON HCL 4 MG/2ML IJ SOLN
INTRAMUSCULAR | Status: DC | PRN
Start: 2016-07-21 — End: 2016-07-21
  Administered 2016-07-21: 4 mg via INTRAVENOUS

## 2016-07-21 MED ORDER — PROPOFOL 10 MG/ML IV BOLUS
INTRAVENOUS | Status: AC
  Filled 2016-07-21: qty 40

## 2016-07-21 MED ORDER — MIDAZOLAM HCL 2 MG/2ML IJ SOLN
INTRAMUSCULAR | Status: AC
  Filled 2016-07-21: qty 2

## 2016-07-21 MED ORDER — LIDOCAINE 2% (20 MG/ML) 5 ML SYRINGE
INTRAMUSCULAR | Status: DC | PRN
  Administered 2016-07-21: 100 mg via INTRAVENOUS

## 2016-07-21 MED ORDER — CHLORHEXIDINE GLUCONATE CLOTH 2 % EX PADS: 6.0000 | MEDICATED_PAD | Freq: Once | CUTANEOUS | Status: DC

## 2016-07-21 MED ORDER — ACETAMINOPHEN 325 MG PO TABS: 650.0000 mg | ORAL_TABLET | ORAL | Status: DC | PRN

## 2016-07-21 MED ORDER — BUPIVACAINE HCL (PF) 0.25 % IJ SOLN
INTRAMUSCULAR | Status: AC
  Filled 2016-07-21: qty 30

## 2016-07-21 MED ORDER — ROCURONIUM BROMIDE 50 MG/5ML IV SOSY
PREFILLED_SYRINGE | INTRAVENOUS | Status: AC
  Filled 2016-07-21: qty 5

## 2016-07-21 MED ORDER — CEFAZOLIN SODIUM-DEXTROSE 2-4 GM/100ML-% IV SOLN: 2.0000 g | INTRAVENOUS | Status: DC

## 2016-07-21 MED ORDER — FENTANYL CITRATE (PF) 100 MCG/2ML IJ SOLN: 25.0000 ug | INTRAMUSCULAR | Status: DC | PRN

## 2016-07-21 MED ORDER — LIDOCAINE 2% (20 MG/ML) 5 ML SYRINGE
INTRAMUSCULAR | Status: AC
  Filled 2016-07-21: qty 5

## 2016-07-21 MED ORDER — SODIUM CHLORIDE 0.9 % IV SOLN: 250.0000 mL | INTRAVENOUS | Status: DC | PRN

## 2016-07-21 MED ORDER — SODIUM CHLORIDE 0.9 % IR SOLN
Status: DC | PRN
  Administered 2016-07-21: 1000 mL

## 2016-07-21 MED ORDER — OXYCODONE HCL 5 MG PO TABS: 5.0000 mg | ORAL_TABLET | ORAL | Status: DC | PRN

## 2016-07-21 MED ORDER — LACTATED RINGERS IV SOLN
INTRAVENOUS | Status: DC | PRN
  Administered 2016-07-21: 06:00:00 via INTRAVENOUS

## 2016-07-21 MED ORDER — MEPERIDINE HCL 50 MG/ML IJ SOLN: 6.2500 mg | INTRAMUSCULAR | Status: DC | PRN

## 2016-07-21 MED ORDER — CEFAZOLIN SODIUM-DEXTROSE 2-4 GM/100ML-% IV SOLN
INTRAVENOUS | Status: AC
  Filled 2016-07-21: qty 100

## 2016-07-21 MED ORDER — ACETAMINOPHEN 650 MG RE SUPP
650.0000 mg | RECTAL | Status: DC | PRN
  Filled 2016-07-21: qty 1

## 2016-07-21 MED ORDER — PROPOFOL 10 MG/ML IV BOLUS
INTRAVENOUS | Status: DC | PRN
Start: 2016-07-21 — End: 2016-07-21
  Administered 2016-07-21: 200 mg via INTRAVENOUS

## 2016-07-21 MED ORDER — SUFENTANIL 5MCG/ML (10ML) SYRINGE FOR MEDFUSION PUMP - OPTIME
INTRAVENOUS | Status: DC | PRN
  Administered 2016-07-21: 5 ug via INTRAVENOUS

## 2016-07-21 MED ORDER — SUCCINYLCHOLINE CHLORIDE 200 MG/10ML IV SOSY
PREFILLED_SYRINGE | INTRAVENOUS | Status: AC
  Filled 2016-07-21: qty 10

## 2016-07-21 MED ORDER — DEXAMETHASONE SODIUM PHOSPHATE 10 MG/ML IJ SOLN
INTRAMUSCULAR | Status: AC
  Filled 2016-07-21: qty 1

## 2016-07-21 MED ORDER — CEFAZOLIN SODIUM-DEXTROSE 2-3 GM-% IV SOLR
INTRAVENOUS | Status: DC | PRN
  Administered 2016-07-21: 2 g via INTRAVENOUS

## 2016-07-21 MED ORDER — LIDOCAINE 2% (20 MG/ML) 5 ML SYRINGE
INTRAMUSCULAR | Status: AC
  Filled 2016-07-21: qty 10

## 2016-07-21 MED ORDER — MIDAZOLAM HCL 5 MG/5ML IJ SOLN
INTRAMUSCULAR | Status: DC | PRN
  Administered 2016-07-21: 1 mg via INTRAVENOUS

## 2016-07-21 MED ORDER — MIDAZOLAM HCL 2 MG/2ML IJ SOLN: 0.5000 mg | Freq: Once | INTRAMUSCULAR | Status: DC | PRN

## 2016-07-21 MED ORDER — ACETAMINOPHEN 500 MG PO TABS
1000.0000 mg | ORAL_TABLET | ORAL | Status: AC
  Administered 2016-07-21: 1000 mg via ORAL
  Filled 2016-07-21: qty 2

## 2016-07-21 MED ORDER — SUFENTANIL CITRATE 50 MCG/ML IV SOLN
INTRAVENOUS | Status: AC
  Filled 2016-07-21: qty 1

## 2016-07-21 MED ORDER — OXYCODONE-ACETAMINOPHEN 5-325 MG PO TABS: 1.0000 | ORAL_TABLET | ORAL | 0 refills | Status: DC | PRN

## 2016-07-21 MED ORDER — PROMETHAZINE HCL 25 MG/ML IJ SOLN: 6.2500 mg | INTRAMUSCULAR | Status: DC | PRN

## 2016-07-21 MED ORDER — EPHEDRINE SULFATE-NACL 50-0.9 MG/10ML-% IV SOSY
PREFILLED_SYRINGE | INTRAVENOUS | Status: DC | PRN
  Administered 2016-07-21 (×3): 5 mg via INTRAVENOUS

## 2016-07-21 MED ORDER — SODIUM CHLORIDE 0.9% FLUSH: 3.0000 mL | INTRAVENOUS | Status: DC | PRN

## 2016-07-21 MED ORDER — ONDANSETRON HCL 4 MG/2ML IJ SOLN
INTRAMUSCULAR | Status: AC
  Filled 2016-07-21: qty 2

## 2016-07-21 MED ORDER — SODIUM CHLORIDE 0.9% FLUSH: 3.0000 mL | Freq: Two times a day (BID) | INTRAVENOUS | Status: DC

## 2016-07-21 MED ORDER — BUPIVACAINE HCL (PF) 0.25 % IJ SOLN
INTRAMUSCULAR | Status: DC | PRN
  Administered 2016-07-21: 6 mL

## 2016-07-21 MED ORDER — MORPHINE SULFATE (PF) 10 MG/ML IV SOLN: 2.0000 mg | INTRAVENOUS | Status: DC | PRN

## 2016-07-21 SURGICAL SUPPLY — 28 items
APL SKNCLS STERI-STRIP NONHPOA (GAUZE/BANDAGES/DRESSINGS)
BENZOIN TINCTURE PRP APPL 2/3 (GAUZE/BANDAGES/DRESSINGS) IMPLANT
BLADE HEX COATED 2.75 (ELECTRODE) ×2 IMPLANT
COVER SURGICAL LIGHT HANDLE (MISCELLANEOUS) ×1 IMPLANT
DECANTER SPIKE VIAL GLASS SM (MISCELLANEOUS) IMPLANT
DRAPE LAPAROTOMY T 102X78X121 (DRAPES) ×1 IMPLANT
DRAPE LAPAROTOMY TRNSV 102X78 (DRAPE) IMPLANT
ELECT REM PT RETURN 9FT ADLT (ELECTROSURGICAL) ×2
ELECTRODE REM PT RTRN 9FT ADLT (ELECTROSURGICAL) ×1 IMPLANT
EVACUATOR SILICONE 100CC (DRAIN) IMPLANT
GAUZE SPONGE 4X4 12PLY STRL (GAUZE/BANDAGES/DRESSINGS) ×1 IMPLANT
GLOVE BIO SURGEON STRL SZ7.5 (GLOVE) ×4 IMPLANT
GLOVE BIOGEL PI IND STRL 7.0 (GLOVE) ×1 IMPLANT
GLOVE BIOGEL PI INDICATOR 7.0 (GLOVE)
GOWN STRL REUS W/TWL XL LVL3 (GOWN DISPOSABLE) ×4 IMPLANT
KIT BASIN OR (CUSTOM PROCEDURE TRAY) ×2 IMPLANT
LIQUID BAND (GAUZE/BANDAGES/DRESSINGS) ×1 IMPLANT
MARKER SKIN DUAL TIP RULER LAB (MISCELLANEOUS) IMPLANT
NDL HYPO 25X1 1.5 SAFETY (NEEDLE) ×1 IMPLANT
NEEDLE HYPO 25X1 1.5 SAFETY (NEEDLE) ×2 IMPLANT
NS IRRIG 1000ML POUR BTL (IV SOLUTION) ×2 IMPLANT
PACK GENERAL/GYN (CUSTOM PROCEDURE TRAY) ×2 IMPLANT
STAPLER VISISTAT 35W (STAPLE) IMPLANT
SUT MNCRL AB 4-0 PS2 18 (SUTURE) ×1 IMPLANT
SUT NYLON 3 0 (SUTURE) ×1 IMPLANT
SUT VIC AB 3-0 SH 18 (SUTURE) ×1 IMPLANT
SYR CONTROL 10ML LL (SYRINGE) ×2 IMPLANT
TOWEL OR 17X26 10 PK STRL BLUE (TOWEL DISPOSABLE) ×1 IMPLANT

## 2016-07-21 NOTE — Anesthesia Preprocedure Evaluation (Addendum)
Anesthesia Evaluation  Patient identified by MRN, date of birth, ID band Patient awake    Reviewed: Allergy & Precautions, Patient's Chart, lab work & pertinent test results, reviewed documented beta blocker date and time   History of Anesthesia Complications Negative for: history of anesthetic complications  Airway Mallampati: II  TM Distance: >3 FB Neck ROM: Full    Dental  (+) Missing, Dental Advisory Given   Pulmonary asthma ,    breath sounds clear to auscultation       Cardiovascular hypertension, Pt. on medications and Pt. on home beta blockers  Rhythm:Regular Rate:Normal  '13 ECHO: EF 60-65%, valves OK   Neuro/Psych Anxiety Depression Schizophrenia negative neurological ROS     GI/Hepatic Neg liver ROS, GERD  Controlled and Medicated,  Endo/Other  diabetes (glu 159)Hypothyroidism Morbid obesity  Renal/GU Renal InsufficiencyRenal disease (creat 1.50)     Musculoskeletal  (+) Arthritis , Osteoarthritis,    Abdominal (+) + obese,   Peds  Hematology negative hematology ROS (+)   Anesthesia Other Findings   Reproductive/Obstetrics                            Anesthesia Physical Anesthesia Plan  ASA: III  Anesthesia Plan: General   Post-op Pain Management:    Induction: Intravenous  Airway Management Planned: LMA  Additional Equipment:   Intra-op Plan:   Post-operative Plan:   Informed Consent: I have reviewed the patients History and Physical, chart, labs and discussed the procedure including the risks, benefits and alternatives for the proposed anesthesia with the patient or authorized representative who has indicated his/her understanding and acceptance.   Dental advisory given  Plan Discussed with: CRNA and Surgeon  Anesthesia Plan Comments: (Plan routine monitors, GA-LMA OK)        Anesthesia Quick Evaluation

## 2016-07-21 NOTE — Discharge Instructions (Signed)
General Anesthesia, Adult, Care After These instructions provide you with information about caring for yourself after your procedure. Your health care provider may also give you more specific instructions. Your treatment has been planned according to current medical practices, but problems sometimes occur. Call your health care provider if you have any problems or questions after your procedure. What can I expect after the procedure? After the procedure, it is common to have:  Vomiting.  A sore throat.  Mental slowness. It is common to feel:  Nauseous.  Cold or shivery.  Sleepy.  Tired.  Sore or achy, even in parts of your body where you did not have surgery. Follow these instructions at home: For at least 24 hours after the procedure:  Do not:  Participate in activities where you could fall or become injured.  Drive.  Use heavy machinery.  Drink alcohol.  Take sleeping pills or medicines that cause drowsiness.  Make important decisions or sign legal documents.  Take care of children on your own.  Rest. Eating and drinking  If you vomit, drink water, juice, or soup when you can drink without vomiting.  Drink enough fluid to keep your urine clear or pale yellow.  Make sure you have little or no nausea before eating solid foods.  Follow the diet recommended by your health care provider. General instructions  Have a responsible adult stay with you until you are awake and alert.  Return to your normal activities as told by your health care provider. Ask your health care provider what activities are safe for you.  Take over-the-counter and prescription medicines only as told by your health care provider.  If you smoke, do not smoke without supervision.  Keep all follow-up visits as told by your health care provider. This is important. Contact a health care provider if:  You continue to have nausea or vomiting at home, and medicines are not helpful.  You  cannot drink fluids or start eating again.  You cannot urinate after 8-12 hours.  You develop a skin rash.  You have fever.  You have increasing redness at the site of your procedure. Get help right away if:  You have difficulty breathing.  You have chest pain.  You have unexpected bleeding.  You feel that you are having a life-threatening or urgent problem. This information is not intended to replace advice given to you by your health care provider. Make sure you discuss any questions you have with your health care provider. Document Released: 08/22/2000 Document Revised: 10/19/2015 Document Reviewed: 04/30/2015 Elsevier Interactive Patient Education  2017 [employer] Lipoma Removal, Care After Refer to this sheet in the next few weeks. These instructions provide you with information about caring for yourself after your procedure. Your health care provider may also give you more specific instructions. Your treatment has been planned according to current medical practices, but problems sometimes occur. Call your health care provider if you have any problems or questions after your procedure. What can I expect after the procedure? After the procedure, it is common to have:  Mild pain.  Swelling.  Bruising. Follow these instructions at home:   Bathing  Do not take baths, swim, or use a hot tub until your health care provider approves. Ask your health care provider if you can take showers. You may only be allowed to take sponge baths for bathing.  Keep your bandage (dressing) dry until your health care provider says it can be removed. Incision care  Follow instructions from  your health care provider about how to take care of your incision. Make sure you:  Wash your hands with soap and water before you change your bandage (dressing). If soap and water are not available, use hand sanitizer.  Change your dressing as told by your health care provider.  Leave stitches  (sutures), skin glue, or adhesive strips in place. These skin closures may need to stay in place for 2 weeks or longer. If adhesive strip edges start to loosen and curl up, you may trim the loose edges. Do not remove adhesive strips completely unless your health care provider tells you to do that.  Check your incision area every day for signs of infection. Check for:  More redness, swelling, or pain.  Fluid or blood.  Warmth.  Pus or a bad smell. Driving  Do not drive or operate heavy machinery while taking prescription pain medicine.  Do not drive for 24 hours if you received a medicine to help you relax (sedative) during your procedure.  Ask your health care provider when it is safe for you to drive. General instructions  Take over-the-counter and prescription medicines only as told by your health care provider.  Do not use any tobacco products, such as cigarettes, chewing tobacco, and e-cigarettes. These can delay healing. If you need help quitting, ask your health care provider.  Return to your normal activities as told by your health care provider. Ask your health care provider what activities are safe for you.  Keep all follow-up visits as told by your health care provider. This is important. Contact a health care provider if:  You have more redness, swelling, or pain around your incision.  You have fluid or blood coming from your incision.  Your incision feels warm to the touch.  You have pus or a bad smell coming from your incision.  You have pain that does not get better with medicine. Get help right away if:  You have chills or a fever.  You have severe pain. This information is not intended to replace advice given to you by your health care provider. Make sure you discuss any questions you have with your health care provider. Document Released: 07/30/2015 Document Revised: 10/27/2015 Document Reviewed: 07/30/2015 Elsevier Interactive Patient Education  2017  [employer].

## 2016-07-21 NOTE — H&P (Signed)
The patient is a 71 year old female who presents with a complaint of Mass. Pt is a 71 y/o F with a R lateral neck mass. Patient states that she's noticed some discomfort and pain over the last several months. She does feel it is getting larger. Patient had no drainage or erythema from the area.  Patient was CT scan which revealed: IMPRESSION: Palpable abnormality corresponds to a lipoma posterior to the sternocleidomastoid muscle which appears benign       Problem List/Past Medical  GALLSTONES (K80.20)  Other Problems POST-OPERATIVE STATE (Z98.890)03/16/2015 Gastric Ulcer Heart murmur Hepatitis High blood pressure Hypercholesterolemia Inguinal Hernia Thyroid Cancer Thyroid Disease Transfusion history  Past Surgical History ( Appendectomy Breast Biopsy Bilateral. Breast Mass; Local Excision Bilateral. Hysterectomy (not due to cancer) - Partial Knee Surgery Bilateral. Mammoplasty; Reduction Bilateral. Oral Surgery Thyroid Surgery  Diagnostic Studies History  Mammogram within last year Pap Smear 1-5 years ago  Allergies  Aspirin *ANALGESICS - NonNarcotic*  Medication History ( LORazepam (0.5MG  Tablet, Oral) Active. Promethazine-Codeine (6.25-10MG /5ML Syrup, Oral) Active. Atorvastatin Calcium (40MG  Tablet, Oral) Active. Benicar HCT (40-25MG  Tablet, Oral) Active. Dilt-XR (120MG  Capsule ER 24HR, Oral) Active. Estradiol (0.5MG  Tablet, Oral) Active. Fluticasone Propionate (50MCG/ACT Suspension, Nasal) Active. Januvia (50MG  Tablet, Oral) Active. Levothyroxine Sodium (125MCG Tablet, Oral) Active. Metoprolol Succinate ER (100MG  Tablet ER 24HR, Oral) Active. OneTouch Ultra Blue (In Vitro) Active. OneTouch Delica Lancets Fine Active. Pantoprazole Sodium (40MG  Tablet DR, Oral) Active. Potassium Chloride ER (10MEQ Capsule ER, Oral) Active. Sucralfate (1GM Tablet, Oral) Active. Escitalopram Oxalate (20MG  Tablet, Oral)  Active. Lorayne Bender Sustenna (156MG /ML Suspension, Intramuscular) Active. Medications Reconciled  Social History  No caffeine use Tobacco use Never smoker.  Family History  Arthritis Father. Bleeding disorder Family Members In General. Colon Polyps Family Members In General. Depression Family Members In General.  Pregnancy / Birth History ( Age at menarche 28 years. Age of menopause 69-50 Maternal age 5-25 Para 0 BP (!) 153/61   Pulse 69   Temp 98.1 F (36.7 C) (Oral)   Resp 18   Ht 4' 10.25" (1.48 m)   Wt 106.4 kg (234 lb 9 oz)   SpO2 95%   BMI 48.60 kg/m     Physical Exam  General Mental Status-Alert. General Appearance-Consistent with stated age. Hydration-Well hydrated. Voice-Normal.  Head and Neck Note: Right posterior sternocleidomastoid mass, palpable, nontender to palpation, nonerythematous, approximately 2-3 cm in size   Chest and Lung Exam Chest and lung exam reveals -quiet, even and easy respiratory effort with no use of accessory muscles and on auscultation, normal breath sounds, no adventitious sounds and normal vocal resonance. Inspection Chest Wall - Normal. Back - normal.  Cardiovascular Cardiovascular examination reveals -normal heart sounds, regular rate and rhythm with no murmurs and normal pedal pulses bilaterally.  Abdomen Inspection Inspection of the abdomen reveals - No Hernias. Skin - Scar - no surgical scars. Palpation/Percussion Palpation and Percussion of the abdomen reveal - Soft, Non Tender, No Rebound tenderness, No Rigidity (guarding) and No hepatosplenomegaly. Auscultation Auscultation of the abdomen reveals - Bowel sounds normal.  Neurologic Neurologic evaluation reveals -alert and oriented x 3 with no impairment of recent or remote memory. Mental Status-Normal.  Musculoskeletal Normal Exam - Left-Upper Extremity Strength Normal and Lower Extremity Strength Normal. Normal Exam -  Right-Upper Extremity Strength Normal and Lower Extremity Strength Normal.    Assessment & Plan MASS OF LATERAL NECK (R22.1) Impression: 71 year old female with a right lateral neck mass. This appears to be consistent with a benign fatty  mass.  1. The patient will like to proceed to the operating room for excision of mass 2. I discussed with her the risks and benefits of the procedure to include but not limited to: Infection, bleeding, damages running structures, possible recurrence, possible wound healing issues. The patient was understanding and wishes to proceed.

## 2016-07-21 NOTE — Transfer of Care (Signed)
Immediate Anesthesia Transfer of Care Note  Patient: Amanda Cain  Procedure(s) Performed: Procedure(s): EXCISION OF RIGHT NECK MASS (Right)  Patient Location: PACU  Anesthesia Type:General  Level of Consciousness:  sedated, patient cooperative and responds to stimulation  Airway & Oxygen Therapy:Patient Spontanous Breathing and Patient connected to face mask oxgen  Post-op Assessment:  Report given to PACU RN and Post -op Vital signs reviewed and stable  Post vital signs:  Reviewed and stable  Last Vitals:  Vitals:   07/21/16 0539  BP: (!) 153/61  Pulse: 69  Resp: 18  Temp: 23.5 C    Complications: No apparent anesthesia complications

## 2016-07-21 NOTE — Anesthesia Postprocedure Evaluation (Signed)
Anesthesia Post Note  Patient: SHAWNDA MAUNEY  Procedure(s) Performed: Procedure(s) (LRB): EXCISION OF RIGHT NECK MASS (Right)  Patient location during evaluation: PACU Anesthesia Type: General Level of consciousness: awake and alert, oriented and patient cooperative Pain management: pain level controlled Vital Signs Assessment: post-procedure vital signs reviewed and stable Respiratory status: spontaneous breathing, nonlabored ventilation and respiratory function stable Cardiovascular status: blood pressure returned to baseline and stable Postop Assessment: no signs of nausea or vomiting Anesthetic complications: no       Last Vitals:  Vitals:   07/21/16 0915 07/21/16 0948  BP: 115/68 (!) 110/59  Pulse: (!) 43 (!) 48  Resp: 16 18  Temp: 36.5 C     Last Pain:  Vitals:   07/21/16 0948  TempSrc:   PainSc: 0-No pain                 Henning Ehle,E. Monte Zinni

## 2016-07-21 NOTE — Brief Op Note (Signed)
07/21/2016  8:08 AM  PATIENT:  Amanda Cain  71 y.o. female  PRE-OPERATIVE DIAGNOSIS:  Right neck mass  POST-OPERATIVE DIAGNOSIS:  Right neck mass  PROCEDURE:  Procedure(s): EXCISION OF RIGHT NECK MASS (Right)  SURGEON:  Surgeon(s) and Role:    * Ralene Ok, MD - Primary  ANESTHESIA:   local and general  EBL: 5cc  BLOOD ADMINISTERED:none  DRAINS: none   LOCAL MEDICATIONS USED:  BUPIVICAINE   SPECIMEN:  Source of Specimen:  right sided neck mass  DISPOSITION OF SPECIMEN:  PATHOLOGY  COUNTS:  YES  TOURNIQUET:  * No tourniquets in log *  DICTATION: .Dragon Dictation  Indications for procedure: Patient is a 71 year old female with a right-sided neck mass. Patient underwent CT scan which revealed a fatty neck mass. Patient states she was having pain in the area discomfort. Patient's son have this electively removed.  Details of procedure: After the patient was consented she was taken back to the operating room and placed in the supine position with bilateral season place. Patient with general endotracheal anesthesia. Patient is present. The standard fashion. Timeout was called all facts verified.  At this time a recently incision was made in diagonal fashion along the skin crease. Cautery was used to maintain hemostasis and dissection was taken down to the mass. This was easily seen palpable is underneath the skin and in the subcutaneous layer. The mass measured approximately 3 x 3 cm. This was circumferentially dissected away from surrounding tissue musculature. Once this was removed this was sent to pathology for permanent. There is irrigated out with sterile saline. The wound checked for hemostasis which was excellent. At this time through Vicryl using a deep dermal fashion reapproximate the deep dermal layer. Skin was reapproximated using a running subcuticular 4-0 Monocryl. Skin was dressed with Dermabond. Patient tied procedure well was taken to the recovery room in  stable condition.  PLAN OF CARE: Discharge to home after PACU  PATIENT DISPOSITION:  PACU - hemodynamically stable.   Delay start of Pharmacological VTE agent (>24hrs) due to surgical blood loss or risk of bleeding: not applicable

## 2016-07-21 NOTE — Op Note (Signed)
07/21/2016  8:08 AM  PATIENT:  Amanda Cain  71 y.o. female  PRE-OPERATIVE DIAGNOSIS:  Right neck mass  POST-OPERATIVE DIAGNOSIS:  Right neck mass  PROCEDURE:  Procedure(s): EXCISION OF RIGHT NECK MASS (Right)  SURGEON:  Surgeon(s) and Role:    * Ralene Ok, MD - Primary  ANESTHESIA:   local and general  EBL: 5cc  BLOOD ADMINISTERED:none  DRAINS: none   LOCAL MEDICATIONS USED:  BUPIVICAINE   SPECIMEN:  Source of Specimen:  right sided neck mass  DISPOSITION OF SPECIMEN:  PATHOLOGY  COUNTS:  YES  TOURNIQUET:  * No tourniquets in log *  DICTATION: .Dragon Dictation  Indications for procedure: Patient is a 70 year old female with a right-sided neck mass. Patient underwent CT scan which revealed a fatty neck mass. Patient states she was having pain in the area discomfort. Patient's son have this electively removed.  Details of procedure: After the patient was consented she was taken back to the operating room and placed in the supine position with bilateral season place. Patient with general endotracheal anesthesia. Patient is present. The standard fashion. Timeout was called all facts verified.  At this time a recently incision was made in diagonal fashion along the skin crease. Cautery was used to maintain hemostasis and dissection was taken down to the mass. This was easily seen palpable is underneath the skin and in the subcutaneous layer. The mass measured approximately 3 x 3 cm. This was circumferentially dissected away from surrounding tissue musculature. Once this was removed this was sent to pathology for permanent. There is irrigated out with sterile saline. The wound checked for hemostasis which was excellent. At this time through Vicryl using a deep dermal fashion reapproximate the deep dermal layer. Skin was reapproximated using a running subcuticular 4-0 Monocryl. Skin was dressed with Dermabond. Patient tied procedure well was taken to the recovery room in  stable condition.  PLAN OF CARE: Discharge to home after PACU  PATIENT DISPOSITION:  PACU - hemodynamically stable.   Delay start of Pharmacological VTE agent (>24hrs) due to surgical blood loss or risk of bleeding: not applicable

## 2016-07-21 NOTE — Anesthesia Procedure Notes (Signed)
Procedure Name: LMA Insertion Date/Time: 07/21/2016 7:30 AM Performed by: Reda Gettis, Virgel Gess Pre-anesthesia Checklist: Patient identified, Emergency Drugs available, Suction available, Patient being monitored and Timeout performed Patient Re-evaluated:Patient Re-evaluated prior to inductionOxygen Delivery Method: Circle system utilized Preoxygenation: Pre-oxygenation with 100% oxygen Intubation Type: IV induction Ventilation: Mask ventilation without difficulty LMA: LMA inserted LMA Size: 4.0 Number of attempts: 1 Tube secured with: Tape Dental Injury: Teeth and Oropharynx as per pre-operative assessment  Comments: Inserted by [place]

## 2016-07-22 ENCOUNTER — Encounter (HOSPITAL_COMMUNITY): Payer: Self-pay | Admitting: General Surgery

## 2016-08-24 ENCOUNTER — Other Ambulatory Visit: Payer: Self-pay | Admitting: Family Medicine

## 2016-08-24 DIAGNOSIS — Z1231 Encounter for screening mammogram for malignant neoplasm of breast: Secondary | ICD-10-CM

## 2016-09-16 ENCOUNTER — Ambulatory Visit
Admission: RE | Admit: 2016-09-16 | Discharge: 2016-09-16 | Disposition: A | Discharge status: Home / Self Care | Payer: Medicare Other | Source: Ambulatory Visit | Attending: Family Medicine | Admitting: Family Medicine

## 2016-09-16 DIAGNOSIS — Z1231 Encounter for screening mammogram for malignant neoplasm of breast: Secondary | ICD-10-CM

## 2016-09-20 ENCOUNTER — Ambulatory Visit (INDEPENDENT_AMBULATORY_CARE_PROVIDER_SITE_OTHER): Payer: Medicare Other | Admitting: Podiatry

## 2016-09-20 ENCOUNTER — Encounter: Payer: Self-pay | Admitting: Podiatry

## 2016-09-20 DIAGNOSIS — E119 Type 2 diabetes mellitus without complications: Secondary | ICD-10-CM | POA: Diagnosis not present

## 2016-09-20 DIAGNOSIS — B351 Tinea unguium: Secondary | ICD-10-CM | POA: Diagnosis not present

## 2016-09-20 DIAGNOSIS — M79676 Pain in unspecified toe(s): Secondary | ICD-10-CM

## 2016-09-20 NOTE — Patient Instructions (Signed)

## 2016-09-20 NOTE — Progress Notes (Signed)
Patient ID: Amanda Cain, female   DOB: 01-Oct-1945, 71 y.o.   MRN: [id number]   Subjective: This patient presents today stating that her toenails that are thickened and elongated and are comfortable and she walking wearing shoes and requesting nail debridement  Objective: Orientated 3 No open skin lesions bilaterally The toenails are hypertrophic, elongated, brittle, discolored and tender to direct palpation 6-10 DP and PT pulses 2/4 bilaterally Capillary reflex immediate bilaterally Absent hair growth bilaterally Sensation to 10 g monofilament wire intact 5/5 bilaterally Vibratory sensation equal and reactive bilaterally Ankle reflex equal and reactive bilaterally Pes planus bilaterally HAV deformities bilaterally  Assessment: Symptomatic onychomycoses 6-10 Diabetic without complications  Plan: Debridement toenails 6-10 mechanically an electrically without any bleeding  Reappoint 3 months

## 2016-10-11 ENCOUNTER — Other Ambulatory Visit: Payer: Self-pay | Admitting: Gastroenterology

## 2016-11-10 ENCOUNTER — Ambulatory Visit
Admission: RE | Admit: 2016-11-10 | Discharge: 2016-11-10 | Disposition: A | Discharge status: Home / Self Care | Payer: Medicare Other | Source: Ambulatory Visit | Attending: Physician Assistant | Admitting: Physician Assistant

## 2016-11-10 ENCOUNTER — Other Ambulatory Visit: Payer: Self-pay | Admitting: Physician Assistant

## 2016-11-10 DIAGNOSIS — R062 Wheezing: Secondary | ICD-10-CM

## 2016-11-11 ENCOUNTER — Other Ambulatory Visit: Payer: Self-pay | Admitting: Family Medicine

## 2016-11-11 DIAGNOSIS — R600 Localized edema: Secondary | ICD-10-CM

## 2016-11-17 ENCOUNTER — Inpatient Hospital Stay (HOSPITAL_COMMUNITY): Payer: Medicare Other

## 2016-11-17 ENCOUNTER — Encounter (HOSPITAL_COMMUNITY): Payer: Self-pay | Admitting: Emergency Medicine

## 2016-11-17 ENCOUNTER — Emergency Department (HOSPITAL_COMMUNITY): Payer: Medicare Other

## 2016-11-17 ENCOUNTER — Inpatient Hospital Stay (HOSPITAL_COMMUNITY)
Admission: EM | Admit: 2016-11-17 | Discharge: 2016-11-20 | DRG: 291 | Disposition: A | Discharge status: Home Health | Payer: Medicare Other | Attending: Nephrology | Admitting: Nephrology

## 2016-11-17 DIAGNOSIS — E785 Hyperlipidemia, unspecified: Secondary | ICD-10-CM | POA: Diagnosis present

## 2016-11-17 DIAGNOSIS — D638 Anemia in other chronic diseases classified elsewhere: Secondary | ICD-10-CM | POA: Diagnosis not present

## 2016-11-17 DIAGNOSIS — E440 Moderate protein-calorie malnutrition: Secondary | ICD-10-CM

## 2016-11-17 DIAGNOSIS — D631 Anemia in chronic kidney disease: Secondary | ICD-10-CM | POA: Diagnosis present

## 2016-11-17 DIAGNOSIS — Z6830 Body mass index (BMI) 30.0-30.9, adult: Secondary | ICD-10-CM

## 2016-11-17 DIAGNOSIS — I509 Heart failure, unspecified: Secondary | ICD-10-CM | POA: Diagnosis not present

## 2016-11-17 DIAGNOSIS — I10 Essential (primary) hypertension: Secondary | ICD-10-CM | POA: Diagnosis not present

## 2016-11-17 DIAGNOSIS — Z8249 Family history of ischemic heart disease and other diseases of the circulatory system: Secondary | ICD-10-CM

## 2016-11-17 DIAGNOSIS — I5082 Biventricular heart failure: Secondary | ICD-10-CM | POA: Diagnosis present

## 2016-11-17 DIAGNOSIS — N183 Chronic kidney disease, stage 3 unspecified: Secondary | ICD-10-CM

## 2016-11-17 DIAGNOSIS — F329 Major depressive disorder, single episode, unspecified: Secondary | ICD-10-CM | POA: Diagnosis present

## 2016-11-17 DIAGNOSIS — I5033 Acute on chronic diastolic (congestive) heart failure: Secondary | ICD-10-CM | POA: Diagnosis present

## 2016-11-17 DIAGNOSIS — E039 Hypothyroidism, unspecified: Secondary | ICD-10-CM

## 2016-11-17 DIAGNOSIS — E669 Obesity, unspecified: Secondary | ICD-10-CM

## 2016-11-17 DIAGNOSIS — N184 Chronic kidney disease, stage 4 (severe): Secondary | ICD-10-CM

## 2016-11-17 DIAGNOSIS — K219 Gastro-esophageal reflux disease without esophagitis: Secondary | ICD-10-CM | POA: Diagnosis present

## 2016-11-17 DIAGNOSIS — Z7984 Long term (current) use of oral hypoglycemic drugs: Secondary | ICD-10-CM

## 2016-11-17 DIAGNOSIS — E1122 Type 2 diabetes mellitus with diabetic chronic kidney disease: Secondary | ICD-10-CM | POA: Diagnosis present

## 2016-11-17 DIAGNOSIS — Z7951 Long term (current) use of inhaled steroids: Secondary | ICD-10-CM

## 2016-11-17 DIAGNOSIS — N289 Disorder of kidney and ureter, unspecified: Secondary | ICD-10-CM

## 2016-11-17 DIAGNOSIS — E118 Type 2 diabetes mellitus with unspecified complications: Secondary | ICD-10-CM | POA: Diagnosis not present

## 2016-11-17 DIAGNOSIS — R0602 Shortness of breath: Secondary | ICD-10-CM | POA: Diagnosis not present

## 2016-11-17 DIAGNOSIS — J9601 Acute respiratory failure with hypoxia: Secondary | ICD-10-CM | POA: Diagnosis present

## 2016-11-17 DIAGNOSIS — Z79890 Hormone replacement therapy (postmenopausal): Secondary | ICD-10-CM

## 2016-11-17 DIAGNOSIS — Z96651 Presence of right artificial knee joint: Secondary | ICD-10-CM | POA: Diagnosis present

## 2016-11-17 DIAGNOSIS — R188 Other ascites: Secondary | ICD-10-CM | POA: Diagnosis present

## 2016-11-17 DIAGNOSIS — Z886 Allergy status to analgesic agent status: Secondary | ICD-10-CM

## 2016-11-17 DIAGNOSIS — Z6833 Body mass index (BMI) 33.0-33.9, adult: Secondary | ICD-10-CM

## 2016-11-17 DIAGNOSIS — R0902 Hypoxemia: Secondary | ICD-10-CM

## 2016-11-17 DIAGNOSIS — E871 Hypo-osmolality and hyponatremia: Secondary | ICD-10-CM | POA: Diagnosis present

## 2016-11-17 DIAGNOSIS — I444 Left anterior fascicular block: Secondary | ICD-10-CM | POA: Diagnosis present

## 2016-11-17 DIAGNOSIS — R7989 Other specified abnormal findings of blood chemistry: Secondary | ICD-10-CM

## 2016-11-17 DIAGNOSIS — I2510 Atherosclerotic heart disease of native coronary artery without angina pectoris: Secondary | ICD-10-CM | POA: Diagnosis present

## 2016-11-17 DIAGNOSIS — J441 Chronic obstructive pulmonary disease with (acute) exacerbation: Secondary | ICD-10-CM | POA: Diagnosis present

## 2016-11-17 DIAGNOSIS — I130 Hypertensive heart and chronic kidney disease with heart failure and stage 1 through stage 4 chronic kidney disease, or unspecified chronic kidney disease: Principal | ICD-10-CM | POA: Diagnosis present

## 2016-11-17 DIAGNOSIS — N189 Chronic kidney disease, unspecified: Secondary | ICD-10-CM | POA: Diagnosis not present

## 2016-11-17 DIAGNOSIS — D649 Anemia, unspecified: Secondary | ICD-10-CM | POA: Diagnosis present

## 2016-11-17 DIAGNOSIS — N179 Acute kidney failure, unspecified: Secondary | ICD-10-CM | POA: Diagnosis present

## 2016-11-17 DIAGNOSIS — Z79899 Other long term (current) drug therapy: Secondary | ICD-10-CM

## 2016-11-17 DIAGNOSIS — I359 Nonrheumatic aortic valve disorder, unspecified: Secondary | ICD-10-CM | POA: Diagnosis not present

## 2016-11-17 HISTORY — DX: Heart failure, unspecified: I50.9

## 2016-11-17 LAB — BASIC METABOLIC PANEL
Anion gap: 8 (ref 5–15)
BUN: 23 mg/dL — ABNORMAL HIGH (ref 6–20)
CO2: 28 mmol/L (ref 22–32)
Calcium: 8.5 mg/dL — ABNORMAL LOW (ref 8.9–10.3)
Chloride: 89 mmol/L — ABNORMAL LOW (ref 101–111)
Creatinine, Ser: 1.49 mg/dL — ABNORMAL HIGH (ref 0.44–1.00)
GFR calc Af Amer: 39 mL/min — ABNORMAL LOW (ref >60)
GFR calc non Af Amer: 34 mL/min — ABNORMAL LOW (ref >60)
Glucose, Bld: 216 mg/dL — ABNORMAL HIGH (ref 65–99)
Potassium: 3.9 mmol/L (ref 3.5–5.1)
Sodium: 125 mmol/L — ABNORMAL LOW (ref 135–145)

## 2016-11-17 LAB — GLUCOSE, CAPILLARY
Glucose-Capillary: 222 mg/dL — ABNORMAL HIGH (ref 65–99)
Glucose-Capillary: 248 mg/dL — ABNORMAL HIGH (ref 65–99)
Glucose-Capillary: 183 mg/dL — ABNORMAL HIGH (ref 65–99)
Glucose-Capillary: 220 mg/dL — ABNORMAL HIGH (ref 65–99)

## 2016-11-17 LAB — CBC WITH DIFFERENTIAL/PLATELET
Basophils Absolute: 0 10*3/uL (ref 0.0–0.1)
Basophils Relative: 0 %
Eosinophils Absolute: 0.2 10*3/uL (ref 0.0–0.7)
Eosinophils Relative: 1 %
HCT: 29.9 % — ABNORMAL LOW (ref 36.0–46.0)
Hemoglobin: 9.8 g/dL — ABNORMAL LOW (ref 12.0–15.0)
Lymphocytes Relative: 11 %
Lymphs Abs: 1.1 10*3/uL (ref 0.7–4.0)
MCH: 28.2 pg (ref 26.0–34.0)
MCHC: 32.8 g/dL (ref 30.0–36.0)
MCV: 86.2 fL (ref 78.0–100.0)
Monocytes Absolute: 0.6 10*3/uL (ref 0.1–1.0)
Monocytes Relative: 6 %
Neutro Abs: 8.7 10*3/uL — ABNORMAL HIGH (ref 1.7–7.7)
Neutrophils Relative %: 82 %
Platelets: 295 10*3/uL (ref 150–400)
RBC: 3.47 MIL/uL — ABNORMAL LOW (ref 3.87–5.11)
RDW: 15.9 % — ABNORMAL HIGH (ref 11.5–15.5)
WBC: 10.7 10*3/uL — ABNORMAL HIGH (ref 4.0–10.5)

## 2016-11-17 LAB — TSH: TSH: 1.246 u[IU]/mL (ref 0.350–4.500)

## 2016-11-17 LAB — I-STAT TROPONIN, ED: Troponin i, poc: 0.01 ng/mL (ref 0.00–0.08)

## 2016-11-17 LAB — OSMOLALITY: Osmolality: 283 mOsm/kg (ref 275–295)

## 2016-11-17 LAB — ECHOCARDIOGRAM COMPLETE
Height: 59 in
Weight: 3911.84 oz

## 2016-11-17 LAB — BRAIN NATRIURETIC PEPTIDE: B Natriuretic Peptide: 106.3 pg/mL — ABNORMAL HIGH (ref 0.0–100.0)

## 2016-11-17 MED ORDER — ALUM & MAG HYDROXIDE-SIMETH 200-200-20 MG/5ML PO SUSP
30.0000 mL | ORAL | Status: DC | PRN
  Administered 2016-11-17 – 2016-11-19 (×5): 30 mL via ORAL
  Filled 2016-11-17 (×5): qty 30

## 2016-11-17 MED ORDER — LORAZEPAM 0.5 MG PO TABS
0.5000 mg | ORAL_TABLET | Freq: Two times a day (BID) | ORAL | Status: DC | PRN
  Administered 2016-11-17 – 2016-11-20 (×5): 0.5 mg via ORAL
  Filled 2016-11-17 (×5): qty 1

## 2016-11-17 MED ORDER — ACETAMINOPHEN 325 MG PO TABS
650.0000 mg | ORAL_TABLET | Freq: Four times a day (QID) | ORAL | Status: DC | PRN
  Administered 2016-11-17 – 2016-11-19 (×2): 650 mg via ORAL
  Filled 2016-11-17 (×2): qty 2

## 2016-11-17 MED ORDER — IPRATROPIUM-ALBUTEROL 0.5-2.5 (3) MG/3ML IN SOLN: 3.0000 mL | Freq: Once | RESPIRATORY_TRACT | Status: DC

## 2016-11-17 MED ORDER — ACETAMINOPHEN 650 MG RE SUPP: 650.0000 mg | Freq: Four times a day (QID) | RECTAL | Status: DC | PRN

## 2016-11-17 MED ORDER — IRBESARTAN 300 MG PO TABS
300.0000 mg | ORAL_TABLET | Freq: Every day | ORAL | Status: DC
  Administered 2016-11-17 – 2016-11-18 (×2): 300 mg via ORAL
  Filled 2016-11-17 (×2): qty 1

## 2016-11-17 MED ORDER — FUROSEMIDE 10 MG/ML IJ SOLN
40.0000 mg | Freq: Two times a day (BID) | INTRAMUSCULAR | Status: DC
  Administered 2016-11-17 – 2016-11-18 (×2): 40 mg via INTRAVENOUS
  Filled 2016-11-17 (×2): qty 4

## 2016-11-17 MED ORDER — ONDANSETRON HCL 4 MG/2ML IJ SOLN: 4.0000 mg | Freq: Four times a day (QID) | INTRAMUSCULAR | Status: DC | PRN

## 2016-11-17 MED ORDER — CHLORHEXIDINE GLUCONATE 0.12 % MT SOLN
15.0000 mL | Freq: Two times a day (BID) | OROMUCOSAL | Status: DC
  Administered 2016-11-17 – 2016-11-20 (×6): 15 mL via OROMUCOSAL
  Filled 2016-11-17 (×6): qty 15

## 2016-11-17 MED ORDER — ORAL CARE MOUTH RINSE
15.0000 mL | Freq: Two times a day (BID) | OROMUCOSAL | Status: DC
  Administered 2016-11-17 – 2016-11-19 (×4): 15 mL via OROMUCOSAL

## 2016-11-17 MED ORDER — LEVOTHYROXINE SODIUM 100 MCG PO TABS
200.0000 ug | ORAL_TABLET | Freq: Every day | ORAL | Status: DC
  Administered 2016-11-17 – 2016-11-20 (×4): 200 ug via ORAL
  Filled 2016-11-17 (×4): qty 2

## 2016-11-17 MED ORDER — INSULIN ASPART 100 UNIT/ML ~~LOC~~ SOLN
0.0000 [IU] | Freq: Three times a day (TID) | SUBCUTANEOUS | Status: DC
  Administered 2016-11-17 (×2): 5 [IU] via SUBCUTANEOUS
  Administered 2016-11-17 – 2016-11-18 (×2): 3 [IU] via SUBCUTANEOUS
  Administered 2016-11-18: 2 [IU] via SUBCUTANEOUS
  Administered 2016-11-18: 3 [IU] via SUBCUTANEOUS
  Administered 2016-11-19: 2 [IU] via SUBCUTANEOUS
  Administered 2016-11-19: 3 [IU] via SUBCUTANEOUS
  Administered 2016-11-20: 2 [IU] via SUBCUTANEOUS

## 2016-11-17 MED ORDER — POTASSIUM CHLORIDE CRYS ER 20 MEQ PO TBCR
20.0000 meq | EXTENDED_RELEASE_TABLET | Freq: Two times a day (BID) | ORAL | Status: DC
  Administered 2016-11-17 – 2016-11-18 (×3): 20 meq via ORAL
  Filled 2016-11-17 (×3): qty 1

## 2016-11-17 MED ORDER — DILTIAZEM HCL ER COATED BEADS 180 MG PO CP24
360.0000 mg | ORAL_CAPSULE | Freq: Every day | ORAL | Status: DC
  Administered 2016-11-17 – 2016-11-20 (×4): 360 mg via ORAL
  Filled 2016-11-17 (×4): qty 2

## 2016-11-17 MED ORDER — ONDANSETRON HCL 4 MG PO TABS: 4.0000 mg | ORAL_TABLET | Freq: Four times a day (QID) | ORAL | Status: DC | PRN

## 2016-11-17 MED ORDER — HYDROCHLOROTHIAZIDE 25 MG PO TABS: 25.0000 mg | ORAL_TABLET | Freq: Every day | ORAL | Status: DC

## 2016-11-17 MED ORDER — IPRATROPIUM-ALBUTEROL 0.5-2.5 (3) MG/3ML IN SOLN
3.0000 mL | Freq: Once | RESPIRATORY_TRACT | Status: AC
  Administered 2016-11-17: 3 mL via RESPIRATORY_TRACT
  Filled 2016-11-17: qty 3

## 2016-11-17 MED ORDER — ENOXAPARIN SODIUM 40 MG/0.4ML ~~LOC~~ SOLN
40.0000 mg | SUBCUTANEOUS | Status: DC
  Administered 2016-11-17 – 2016-11-18 (×2): 40 mg via SUBCUTANEOUS
  Filled 2016-11-17 (×2): qty 0.4

## 2016-11-17 MED ORDER — INSULIN ASPART 100 UNIT/ML ~~LOC~~ SOLN
0.0000 [IU] | Freq: Every day | SUBCUTANEOUS | Status: DC
  Administered 2016-11-17: 2 [IU] via SUBCUTANEOUS

## 2016-11-17 MED ORDER — FERROUS SULFATE 325 (65 FE) MG PO TABS
325.0000 mg | ORAL_TABLET | Freq: Two times a day (BID) | ORAL | Status: DC
  Administered 2016-11-17 – 2016-11-20 (×7): 325 mg via ORAL
  Filled 2016-11-17 (×8): qty 1

## 2016-11-17 MED ORDER — LEVOTHYROXINE SODIUM 50 MCG PO TABS
50.0000 ug | ORAL_TABLET | Freq: Every day | ORAL | Status: DC
  Administered 2016-11-17 – 2016-11-20 (×4): 50 ug via ORAL
  Filled 2016-11-17 (×4): qty 1

## 2016-11-17 MED ORDER — METOPROLOL SUCCINATE ER 100 MG PO TB24
100.0000 mg | ORAL_TABLET | Freq: Every morning | ORAL | Status: DC
  Administered 2016-11-17 – 2016-11-20 (×4): 100 mg via ORAL
  Filled 2016-11-17 (×4): qty 1

## 2016-11-17 MED ORDER — OLMESARTAN MEDOXOMIL-HCTZ 40-25 MG PO TABS: 1.0000 | ORAL_TABLET | Freq: Every morning | ORAL | Status: DC

## 2016-11-17 MED ORDER — FUROSEMIDE 10 MG/ML IJ SOLN
40.0000 mg | Freq: Once | INTRAMUSCULAR | Status: AC
  Administered 2016-11-17: 40 mg via INTRAVENOUS
  Filled 2016-11-17: qty 4

## 2016-11-17 MED ORDER — ATORVASTATIN CALCIUM 40 MG PO TABS
80.0000 mg | ORAL_TABLET | Freq: Every day | ORAL | Status: DC
  Administered 2016-11-17 – 2016-11-20 (×4): 80 mg via ORAL
  Filled 2016-11-17 (×4): qty 2

## 2016-11-17 MED ORDER — ESCITALOPRAM OXALATE 20 MG PO TABS
20.0000 mg | ORAL_TABLET | Freq: Every day | ORAL | Status: DC
  Administered 2016-11-17 – 2016-11-19 (×3): 20 mg via ORAL
  Filled 2016-11-17 (×3): qty 1

## 2016-11-17 MED ORDER — PALIPERIDONE ER 3 MG PO TB24
3.0000 mg | ORAL_TABLET | Freq: Every day | ORAL | Status: DC
  Administered 2016-11-17 – 2016-11-19 (×3): 3 mg via ORAL
  Filled 2016-11-17 (×3): qty 1

## 2016-11-17 NOTE — Progress Notes (Signed)
PROGRESS NOTE [facility]   KALASIA CRAFTON   PFX:[id number] DOB: 1945/09/19  DOA: 11/17/2016 PCP: Mayra Neer, MD   Brief Narrative:  71 y.o. Female with significant Hx of Diabetes, HTN, and CAD presents with dyspnea, orthopnea, wheeze, and increased swelling. Pt was diagnosed with bronchitis a few weeks ago and treated with prednisone which did not help. Last week noticed increased dyspnea on exertion and swelling in her LE, UE, and stomach. Returned to PCP, got CXR which showed edema and was started on Lasix Thursday. She admitted decreased urine output prior to lasix, lasix slightly improved this. EMS O2 sat was 70% on room air. ED vitals: SpO2 84-88% on room air, BP 174/69, Na 125, Cr 1.49 (baseline 1.5), BNP 106, CXR- worsened edema fro 1 week ago, Troponin negative. She is currently on 3L of O2 via [place].  Assessment & Plan:   Principal Problem:   Acute on chronic diastolic CHF (congestive heart failure) (HCC) Active Problems:   Obesity (BMI 30.0-34.9)   Hyponatremia   Anemia in other chronic diseases classified elsewhere   Moderate protein-calorie malnutrition (HCC)   Type 2 diabetes mellitus with complication, without long-term current use of insulin (HCC)   Essential hypertension   Hypothyroidism, acquired   CKD (chronic kidney disease), stage III  Acute on chronic diastolic CHF: BNP 329. Edema on CXR. Continue IV Lasix, BB, and ARBs. Check BMP, Mag, Cbc tomorrow. Monitor Input/output, daily weights, telemetry. Awaiting echo results.   Hyponatremia: awaiting urine Na, trend Na, Continue SSRI and HCTZ.  Non-insulin dependent diabetes: Continue SSI.  Ascites: Phx of ascites, no Hx of cirrhosis. Awaiting [place] results. Awaiting LFTs and INR.  Hypothyroidism: Continue Levothyroxine.  HTN: 138/75. Continue ARB, HCTZ, diltiazem, and BB.  Anemia from chronic kidney disease: stable. Continue iron.  Hx of asthma: reports inhaler use at home without benefit. Hold  inhalers.  Mood disorder: Continue Invega.    DVT prophylaxis: Lovenox Code Status: FULL Family Communication: None at bedside Disposition Plan: See how patient responds to Lasix.   Consultants:   None   Procedures:   None  Antimicrobials:  None    Subjective: Patient seen and examined. She reports little improvement since arriving this morning. Patient is still experiencing SOB, productive cough with green sputum, swelling, and overall "no feeling well." Patient states she was having decreased urination prior to starting lasix on Thursday.    Objective: Vitals:   11/17/16 0449 11/17/16 0546 11/17/16 0600 11/17/16 0653  BP:  (!) 153/73 (!) 147/72 138/75  Pulse:  71 69 66  Resp:  (!) 21 18   Temp:    98 F (36.7 C)  TempSrc:    Oral  SpO2: 100% 98% 94% 100%  Weight:    244 lb 7.8 oz (110.9 kg)  Height:    4\' 11"  (1.499 m)    Intake/Output Summary (Last 24 hours) at 11/17/16 1013 Last data filed at 11/17/16 0950  Gross per 24 hour  Intake              240 ml  Output              450 ml  Net             -210 ml   Filed Weights   11/17/16 0653  Weight: 244 lb 7.8 oz (110.9 kg)    Examination:  General exam: Appears fatigues, in no acute distress Respiratory system: Diffuse decreased breath sounds, diffuse crackles bilaterally Cardiovascular system: S1 &  S2 heard, RRR. No JVD, murmurs, rubs or gallops Central nervous system: Alert and oriented. No focal neurological deficits. Extremities: LE edema Skin: No rashes, lesions or ulcers Psychiatry: Judgement and insight appear normal. Mood & affect appropriate.    Data Reviewed: I have personally reviewed following labs and imaging studies  CBC:  Recent Labs Lab 11/17/16 0116  WBC 10.7*  NEUTROABS 8.7*  HGB 9.8*  HCT 29.9*  MCV 86.2  PLT 619   Basic Metabolic Panel:  Recent Labs Lab 11/17/16 0116  NA 125*  K 3.9  CL 89*  CO2 28  GLUCOSE 216*  BUN 23*  CREATININE 1.49*  CALCIUM 8.5*    GFR: Estimated Creatinine Clearance: 37.9 mL/min (A) (by C-G formula based on SCr of 1.49 mg/dL (H)). Liver Function Tests: No results for input(s): AST, ALT, ALKPHOS, BILITOT, PROT, ALBUMIN in the last 168 hours. No results for input(s): LIPASE, AMYLASE in the last 168 hours. No results for input(s): AMMONIA in the last 168 hours. Coagulation Profile: No results for input(s): INR, PROTIME in the last 168 hours. Cardiac Enzymes: No results for input(s): CKTOTAL, CKMB, CKMBINDEX, TROPONINI in the last 168 hours. BNP (last 3 results) No results for input(s): PROBNP in the last 8760 hours. HbA1C: No results for input(s): HGBA1C in the last 72 hours. CBG:  Recent Labs Lab 11/17/16 0745  GLUCAP 222*   Lipid Profile: No results for input(s): CHOL, HDL, LDLCALC, TRIG, CHOLHDL, LDLDIRECT in the last 72 hours. Thyroid Function Tests: No results for input(s): TSH, T4TOTAL, FREET4, T3FREE, THYROIDAB in the last 72 hours. Anemia Panel: No results for input(s): VITAMINB12, FOLATE, FERRITIN, TIBC, IRON, RETICCTPCT in the last 72 hours. Sepsis Labs: No results for input(s): PROCALCITON, LATICACIDVEN in the last 168 hours.  No results found for this or any previous visit (from the past 240 hour(s)).       Radiology Studies: Dg Chest 2 View  Result Date: 11/17/2016 CLINICAL DATA:  Shortness of breath for 2 weeks. Recent diagnosis of bronchitis. EXAM: CHEST  2 VIEW COMPARISON:  11/10/2016 FINDINGS: Stable cardiomegaly and mediastinal contours. Slight worsening of pulmonary edema and vascular congestion. Fluid in the fissures without subpulmonic effusion. No confluent airspace disease. No pneumothorax. Stable osseous structures. IMPRESSION: Findings consistent with mild worsening of CHF. Electronically Signed   By: Jeb Levering M.D.   On: 11/17/2016 01:18   US Abdomen Limited Ruq  Result Date: 11/17/2016 CLINICAL DATA:  Ascites.  Evaluate liver EXAM: ULTRASOUND ABDOMEN LIMITED RIGHT  UPPER QUADRANT COMPARISON:  Ultrasound 08/12/2014 FINDINGS: Gallbladder: Prior cholecystectomy Common bile duct: Diameter: Normal caliber, 4 mm Liver: No focal lesion identified. Within normal limits in parenchymal echogenicity. IMPRESSION: Unremarkable right upper quadrant ultrasound. Electronically Signed   By: Rolm Baptise M.D.   On: 11/17/2016 08:58      Scheduled Meds: . atorvastatin  80 mg Oral Daily  . diltiazem  360 mg Oral Daily  . enoxaparin (LOVENOX) injection  40 mg Subcutaneous Q24H  . escitalopram  20 mg Oral QHS  . ferrous sulfate  325 mg Oral BID WC  . furosemide  40 mg Intravenous BID  . insulin aspart  0-15 Units Subcutaneous TID WC  . insulin aspart  0-5 Units Subcutaneous QHS  . irbesartan  300 mg Oral Daily  . levothyroxine  200 mcg Oral QAC breakfast  . levothyroxine  50 mcg Oral QAC breakfast  . metoprolol succinate  100 mg Oral q morning - 10a  . paliperidone  3 mg Oral QHS  .  potassium chloride  20 mEq Oral BID   Continuous Infusions:   LOS: 0 days    Romeo Zielinski, PA-s  If 7PM-7AM, please contact night-coverage [email or web address] Password [facility] 11/17/2016, 10:13 AM

## 2016-11-17 NOTE — ED Notes (Signed)
Hospitalist at bedside 

## 2016-11-17 NOTE — Care Management Note (Signed)
Case Management Note  Patient Details  Name: Amanda Cain MRN: [id number] Date of Birth: 03/21/1946  Subjective/Objective:  71 y/o f admitted w/CHF. From home. Has pcp,pharmacy.CM cons-CHF screen-EF 60-65%. PT/OT cons-await recc.                  Action/Plan:d/c plan home likely [place].   Expected Discharge Date:                  Expected Discharge Plan:  [place]  In-House Referral:     Discharge planning Services  CM Consult  Post Acute Care Choice:    Choice offered to:     DME Arranged:    DME Agency:     HH Arranged:    [place] Agency:     Status of Service:  In process, will continue to follow  If discussed at Long Length of Stay Meetings, dates discussed:    Additional Comments:  Dessa Phi, RN 11/17/2016, 10:29 AM

## 2016-11-17 NOTE — ED Notes (Signed)
EKG given to EDP,Glick,MD. For review.

## 2016-11-17 NOTE — ED Triage Notes (Signed)
Pt brought in by EMS with c/o shortness of breath  Pt states she has been this way for the past 2 weeks worse today  Pt was diagnosed with bronchitis a week ago  Pt has a nebulizer at home but pt was unable to set it up by herself   PTAR arrived at house prior to EMS and stated that her O2 sats were in the 70s when they got there and so they started a neb tx  EMS states when they arrived the pt was diaphoretic and tachypnic EMS started an IV and gave 125 solumedrol, placed the pt on a CPAP but the pt got nauseated and vomiting so they removed it  Pt was given zofran odt 4mg    Pt has had a total of albuterol 15mg  and atrovent 1mg   EMS states EKG was unremarkable

## 2016-11-17 NOTE — Progress Notes (Signed)
  Echocardiogram 2D Echocardiogram has been performed.  Amanda Cain 11/17/2016, 12:36 PM
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
## 2016-11-18 DIAGNOSIS — N179 Acute kidney failure, unspecified: Secondary | ICD-10-CM

## 2016-11-18 DIAGNOSIS — N189 Chronic kidney disease, unspecified: Secondary | ICD-10-CM

## 2016-11-18 DIAGNOSIS — R188 Other ascites: Secondary | ICD-10-CM

## 2016-11-18 DIAGNOSIS — I359 Nonrheumatic aortic valve disorder, unspecified: Secondary | ICD-10-CM

## 2016-11-18 DIAGNOSIS — D638 Anemia in other chronic diseases classified elsewhere: Secondary | ICD-10-CM

## 2016-11-18 LAB — GLUCOSE, CAPILLARY
Glucose-Capillary: 183 mg/dL — ABNORMAL HIGH (ref 65–99)
Glucose-Capillary: 145 mg/dL — ABNORMAL HIGH (ref 65–99)
Glucose-Capillary: 171 mg/dL — ABNORMAL HIGH (ref 65–99)
Glucose-Capillary: 178 mg/dL — ABNORMAL HIGH (ref 65–99)

## 2016-11-18 LAB — HEPATIC FUNCTION PANEL
ALT: 30 U/L (ref 14–54)
AST: 26 U/L (ref 15–41)
Albumin: 3.9 g/dL (ref 3.5–5.0)
Alkaline Phosphatase: 60 U/L (ref 38–126)
Bilirubin, Direct: <0.1 mg/dL — ABNORMAL LOW (ref 0.1–0.5)
Total Bilirubin: 0.4 mg/dL (ref 0.3–1.2)
Total Protein: 7.2 g/dL (ref 6.5–8.1)

## 2016-11-18 LAB — BASIC METABOLIC PANEL
Anion gap: 8 (ref 5–15)
BUN: 43 mg/dL — ABNORMAL HIGH (ref 6–20)
CO2: 32 mmol/L (ref 22–32)
Calcium: 8.7 mg/dL — ABNORMAL LOW (ref 8.9–10.3)
Chloride: 87 mmol/L — ABNORMAL LOW (ref 101–111)
Creatinine, Ser: 2.02 mg/dL — ABNORMAL HIGH (ref 0.44–1.00)
GFR calc Af Amer: 27 mL/min — ABNORMAL LOW (ref >60)
GFR calc non Af Amer: 23 mL/min — ABNORMAL LOW (ref >60)
Glucose, Bld: 187 mg/dL — ABNORMAL HIGH (ref 65–99)
Potassium: 4.9 mmol/L (ref 3.5–5.1)
Sodium: 127 mmol/L — ABNORMAL LOW (ref 135–145)

## 2016-11-18 LAB — CBC
HCT: 31.3 % — ABNORMAL LOW (ref 36.0–46.0)
Hemoglobin: 10 g/dL — ABNORMAL LOW (ref 12.0–15.0)
MCH: 28 pg (ref 26.0–34.0)
MCHC: 31.9 g/dL (ref 30.0–36.0)
MCV: 87.7 fL (ref 78.0–100.0)
Platelets: 340 10*3/uL (ref 150–400)
RBC: 3.57 MIL/uL — ABNORMAL LOW (ref 3.87–5.11)
RDW: 16 % — ABNORMAL HIGH (ref 11.5–15.5)
WBC: 12 10*3/uL — ABNORMAL HIGH (ref 4.0–10.5)

## 2016-11-18 LAB — HEMOGLOBIN A1C
Hgb A1c MFr Bld: 6.3 % — ABNORMAL HIGH (ref 4.8–5.6)
Mean Plasma Glucose: 134 mg/dL

## 2016-11-18 LAB — PROTIME-INR
INR: 1.02
Prothrombin Time: 13.4 seconds (ref 11.4–15.2)

## 2016-11-18 LAB — SODIUM, URINE, RANDOM: Sodium, Ur: <10 mmol/L

## 2016-11-18 LAB — OSMOLALITY, URINE: Osmolality, Ur: 276 mOsm/kg — ABNORMAL LOW (ref 300–900)

## 2016-11-18 MED ORDER — ALBUTEROL SULFATE (2.5 MG/3ML) 0.083% IN NEBU
2.5000 mg | INHALATION_SOLUTION | RESPIRATORY_TRACT | Status: DC | PRN
  Administered 2016-11-20: 2.5 mg via RESPIRATORY_TRACT
  Filled 2016-11-18: qty 3

## 2016-11-18 MED ORDER — ALBUTEROL SULFATE (2.5 MG/3ML) 0.083% IN NEBU
INHALATION_SOLUTION | RESPIRATORY_TRACT | Status: AC
  Administered 2016-11-18: 2.5 mg
  Filled 2016-11-18: qty 3

## 2016-11-18 MED ORDER — SACCHAROMYCES BOULARDII 250 MG PO CAPS
250.0000 mg | ORAL_CAPSULE | Freq: Two times a day (BID) | ORAL | Status: DC
  Administered 2016-11-18 – 2016-11-20 (×4): 250 mg via ORAL
  Filled 2016-11-18 (×4): qty 1

## 2016-11-18 MED ORDER — POLYVINYL ALCOHOL 1.4 % OP SOLN
1.0000 [drp] | OPHTHALMIC | Status: DC | PRN
  Administered 2016-11-18: 1 [drp] via OPHTHALMIC
  Filled 2016-11-18: qty 15

## 2016-11-18 MED ORDER — ENOXAPARIN SODIUM 30 MG/0.3ML ~~LOC~~ SOLN
30.0000 mg | SUBCUTANEOUS | Status: DC
  Administered 2016-11-19 – 2016-11-20 (×2): 30 mg via SUBCUTANEOUS
  Filled 2016-11-18 (×2): qty 0.3

## 2016-11-18 NOTE — Progress Notes (Signed)
PROGRESS NOTE [facility]   EZELLA KELL   WCB:[id number] DOB: June 17, 1945  DOA: 11/17/2016 PCP: Mayra Neer, MD   Brief Narrative:  71 y.o. Female with significant of HTN, CAD, type 2 diabetes on oral medications, CKD stage III presented with dyspnea, orthopnea, wheeze, and increased swelling. Pt recently diagnosed with bronchitisand treated with prednisone which didn't help. Had increasing DOE and LE swelling. Saw her PCP, CXR showed pulmonary edema, started on Lasix last Thursday. Ed vitals: SpO2 84-88% on room air, BP 174/69, Na 125, Cr 1.49 (baseline 1.5), BNP 106, CXR- increased edema from 1 week ago, Troponin negative. Labs today: WBC12, Na 127, Chloride 87, GFR 27, Cr 2.02, BUN 43.    Assessment & Plan:   Principal Problem:   Acute on chronic diastolic CHF (congestive heart failure) (HCC) Active Problems:   Obesity (BMI 30.0-34.9)   Hyponatremia   Anemia in other chronic diseases classified elsewhere   Moderate protein-calorie malnutrition ([place])   Type 2 diabetes mellitus with complication, without long-term current use of insulin (HCC)   Essential hypertension   Hypothyroidism, acquired   CKD (chronic kidney disease), stage III  Acute on Chronic diastolic CHF: BNP 616 on 0/73. Edema on CXR. Discontinue Lasix due to declining kidney function. Continue BB and ARB. Reassess CBC, BMP tomorrow. Monitor ins and outs, daily weights, telemetry. Echo showed possible endo carditis. TEE: awaiting results.  CKD: Hold Lasix. Monitor CBC.  Hyponatremia: 127 today. Awaiting sodium urine and osmolality. CBC tomorrow.  Type 2 Diabetes: Continue SSI.  Ascites: Likely from CHF. RUQ [place] normal.  Hypothyroidism: continue Levothyroxine  HTN: Controlled. Continue ARB, HCTZ, diltiazem and BB  Anemia from CKD: Stable. Continue iron  Mood Disorder: Continue Invega.  DVT prophylaxis: Lovenox Code Status: FULL Family Communication: None at bedside  Disposition Plan: 48-72  hours pending response to Lasix and TEE results.   Consultants:   Cardiology  Procedures: Echo: ------------------------------------------------------------------- Study Conclusions  - Left ventricle: The cavity size was normal. There was moderate   concentric hypertrophy. Systolic function was normal. The   estimated ejection fraction was in the range of 60% to 65%. Wall   motion was normal; there were no regional wall motion   abnormalities. Features are consistent with a pseudonormal left   ventricular filling pattern, with concomitant abnormal relaxation   and increased filling pressure (grade 2 diastolic dysfunction).   Doppler parameters are consistent with high ventricular filling   pressure. - Aortic valve: Moderate focal calcification involving the left   coronary cusp that measures 0.865 x 0.888 cm. This could   represent endocarditis vs. focal calcific degeneration. Clinical   correlation recommend. If endocarditis is a concer, recommend   TEE. There was trivial regurgitation. - Mitral valve: Calcified annulus. - Pulmonic valve: There was trivial regurgitation.  TEE: Ordered  Antimicrobials:  None   Subjective: Patient seen and examined. Patient states she just received breathing treatment which has made her feel better. Pt admits some mild SOB. Denies: Chest pain, N/V/D, Head ache, Dizziness.   Objective: Vitals:   11/17/16 2314 11/18/16 0559 11/18/16 0600 11/18/16 0639  BP:  (!) 113/52    Pulse:  (!) 56    Resp:  (!) 28    Temp:  98.4 F (36.9 C)    TempSrc:  Oral    SpO2: 100% 100%  95%  Weight:   242 lb 8.1 oz (110 kg)   Height:        Intake/Output Summary (Last 24 hours) at  11/18/16 1025 Last data filed at 11/18/16 0826  Gross per 24 hour  Intake              470 ml  Output             1225 ml  Net             -755 ml   Filed Weights   11/17/16 0653 11/18/16 0600  Weight: 244 lb 7.8 oz (110.9 kg) 242 lb 8.1 oz (110 kg)     Examination:  General exam: Appears calm and in no acute distress Respiratory system: Bilateral diffuse crackles. Decreased breath sounds. But improved since yesterday. Cardiovascular system: S1 & S2 heard, RRR. No JVD, murmurs, rubs or gallops Central nervous system: Alert and oriented. No focal neurological deficits. Extremities: mild edema Skin: No rashes, lesions or ulcers Psychiatry: Judgement and insight appear normal. Mood & affect appropriate.    Data Reviewed: I have personally reviewed following labs and imaging studies  CBC:  Recent Labs Lab 11/17/16 0116 11/18/16 0748  WBC 10.7* 12.0*  NEUTROABS 8.7*  --   HGB 9.8* 10.0*  HCT 29.9* 31.3*  MCV 86.2 87.7  PLT 295 962   Basic Metabolic Panel:  Recent Labs Lab 11/17/16 0116 11/18/16 0748  NA 125* 127*  K 3.9 4.9  CL 89* 87*  CO2 28 32  GLUCOSE 216* 187*  BUN 23* 43*  CREATININE 1.49* 2.02*  CALCIUM 8.5* 8.7*   GFR: Estimated Creatinine Clearance: 27.8 mL/min (A) (by C-G formula based on SCr of 2.02 mg/dL (H)). Liver Function Tests:  Recent Labs Lab 11/18/16 0748  AST 26  ALT 30  ALKPHOS 60  BILITOT 0.4  PROT 7.2  ALBUMIN 3.9   No results for input(s): LIPASE, AMYLASE in the last 168 hours. No results for input(s): AMMONIA in the last 168 hours. Coagulation Profile:  Recent Labs Lab 11/18/16 0748  INR 1.02   Cardiac Enzymes: No results for input(s): CKTOTAL, CKMB, CKMBINDEX, TROPONINI in the last 168 hours. BNP (last 3 results) No results for input(s): PROBNP in the last 8760 hours. HbA1C:  Recent Labs  11/17/16 0116  HGBA1C 6.3*   CBG:  Recent Labs Lab 11/17/16 0745 11/17/16 1122 11/17/16 1615 11/17/16 2046 11/18/16 0739  GLUCAP 222* 248* 183* 220* 183*   Lipid Profile: No results for input(s): CHOL, HDL, LDLCALC, TRIG, CHOLHDL, LDLDIRECT in the last 72 hours. Thyroid Function Tests:  Recent Labs  11/17/16 1150  TSH 1.246   Anemia Panel: No results for  input(s): VITAMINB12, FOLATE, FERRITIN, TIBC, IRON, RETICCTPCT in the last 72 hours. Sepsis Labs: No results for input(s): PROCALCITON, LATICACIDVEN in the last 168 hours.  No results found for this or any previous visit (from the past 240 hour(s)).       Radiology Studies: Dg Chest 2 View  Result Date: 11/17/2016 CLINICAL DATA:  Shortness of breath for 2 weeks. Recent diagnosis of bronchitis. EXAM: CHEST  2 VIEW COMPARISON:  11/10/2016 FINDINGS: Stable cardiomegaly and mediastinal contours. Slight worsening of pulmonary edema and vascular congestion. Fluid in the fissures without subpulmonic effusion. No confluent airspace disease. No pneumothorax. Stable osseous structures. IMPRESSION: Findings consistent with mild worsening of CHF. Electronically Signed   By: Jeb Levering M.D.   On: 11/17/2016 01:18   US Abdomen Limited Ruq  Result Date: 11/17/2016 CLINICAL DATA:  Ascites.  Evaluate liver EXAM: ULTRASOUND ABDOMEN LIMITED RIGHT UPPER QUADRANT COMPARISON:  Ultrasound 08/12/2014 FINDINGS: Gallbladder: Prior cholecystectomy Common bile duct: Diameter: Normal  caliber, 4 mm Liver: No focal lesion identified. Within normal limits in parenchymal echogenicity. IMPRESSION: Unremarkable right upper quadrant ultrasound. Electronically Signed   By: Rolm Baptise M.D.   On: 11/17/2016 08:58      Scheduled Meds: . atorvastatin  80 mg Oral Daily  . chlorhexidine  15 mL Mouth Rinse BID  . diltiazem  360 mg Oral Daily  . [START ON 11/19/2016] enoxaparin (LOVENOX) injection  30 mg Subcutaneous Q24H  . escitalopram  20 mg Oral QHS  . ferrous sulfate  325 mg Oral BID WC  . furosemide  40 mg Intravenous BID  . insulin aspart  0-15 Units Subcutaneous TID WC  . insulin aspart  0-5 Units Subcutaneous QHS  . irbesartan  300 mg Oral Daily  . levothyroxine  200 mcg Oral QAC breakfast  . levothyroxine  50 mcg Oral QAC breakfast  . mouth rinse  15 mL Mouth Rinse q12n4p  . metoprolol succinate  100 mg Oral  q morning - 10a  . paliperidone  3 mg Oral QHS  . potassium chloride  20 mEq Oral BID   Continuous Infusions:   LOS: 1 day     Aubrianna Orchard, PA-s  If 7PM-7AM, please contact night-coverage [email or web address] Password [facility] 11/18/2016, 10:25 AM

## 2016-11-18 NOTE — Progress Notes (Signed)
SATURATION QUALIFICATIONS: (This note is used to comply with regulatory documentation for home oxygen)  Patient Saturations on Room Air at Rest = 90%  Patient Saturations on Room Air while Ambulating = 86%  Patient Saturations on 2 Liters of oxygen while Ambulating = 92%  Please briefly explain why patient needs home oxygen:  Patient with SOB and hypoxia ambulating on room air.  [place], [place] 11/18/2016
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
## 2016-11-18 NOTE — Progress Notes (Signed)
Inpatient Diabetes Program Recommendations  AACE/ADA: New Consensus Statement on Inpatient Glycemic Control (2015)  Target Ranges:  Prepandial:   less than 140 mg/dL      Peak postprandial:   less than 180 mg/dL (1-2 hours)      Critically ill patients:  140 - 180 mg/dL   Results for FAYTH, TREFRY (MRN [id number]) as of 11/18/2016 11:08  Ref. Range 11/17/2016 07:45 11/17/2016 11:22 11/17/2016 16:15 11/17/2016 20:46  Glucose-Capillary Latest Ref Range: 65 - 99 mg/dL 222 (H) 248 (H) 183 (H) 220 (H)   Results for JADIA, CAPERS (MRN [id number]) as of 11/18/2016 11:08  Ref. Range 11/18/2016 07:39  Glucose-Capillary Latest Ref Range: 65 - 99 mg/dL 183 (H)    Home DM Meds: Januvia 50 mg daily  Current Insulin Orders: Novolog Moderate Correction Scale/ SSI (0-15 units) TID AC + HS       MD- Please consider the following in-hospital insulin adjustments while home DM meds are on hold:  1. Start low dose basal insulin: Lantus 10 units daily (0.1 units/kg dosing)  2. Start low dose Novolog Meal Coverage: Novolog 3 units TID with meals (hold if pt eats <50% of meal)      --Will follow patient during hospitalization--  Wyn Quaker RN, MSN, CDE Diabetes Coordinator Inpatient Glycemic Control Team Team Pager: [phone] (8a-5p)

## 2016-11-18 NOTE — Evaluation (Signed)
Physical Therapy Evaluation Patient Details Name: Amanda Cain MRN: [id number] DOB: 07/11/1945 Today's Date: 11/18/2016   History of Present Illness  71 y.o. Female with significant of HTN, CAD, type 2 diabetes on oral medications, CKD stage III presented with dyspnea, orthopnea, wheeze, and increased swelling. Pt recently diagnosed with bronchitisand treated with prednisone which didn't help. Had increasing DOE and LE swelling. Saw her PCP, CXR showed pulmonary edema,  Clinical Impression  Patient presents with decreased mobility due to decreased endurance with new O2 requirement.  She will benefit from skilled PT in the acute setting to allow return home with intermittent family support and may benefit from follow up [place]. See separate note for qualifying for supplemental O2.     Follow Up Recommendations Home health PT    Equipment Recommendations  None recommended by PT    Recommendations for Other Services       Precautions / Restrictions Precautions Precautions: Fall Precaution Comments:  (o2) Restrictions Weight Bearing Restrictions: No      Mobility  Bed Mobility               General bed mobility comments: up in chair  Transfers Overall transfer level: Needs assistance Equipment used: Rolling walker (2 wheeled) Transfers: Sit to/from Stand Sit to Stand: Supervision Stand pivot transfers: Min guard          Ambulation/Gait Ambulation/Gait assistance: Supervision Ambulation Distance (Feet): 150 Feet Assistive device: Rolling walker (2 wheeled) Gait Pattern/deviations: Step-through pattern;Decreased stride length     General Gait Details: safe with RW  Stairs            Wheelchair Mobility    Modified [place] (Stroke Patients Only)       Balance Overall balance assessment: Needs assistance   Sitting balance-Leahy Scale: Good       Standing balance-Leahy Scale: Fair Standing balance comment: can perform two handed task in static  standing                             Pertinent Vitals/Pain Pain Assessment: No/denies pain    Home Living Family/patient expects to be discharged to:: Private residence Living Arrangements: Alone Available Help at Discharge: Family;Available PRN/intermittently Type of Home: Apartment Home Access: Level entry     Home Layout: One level Home Equipment: Walker - 2 wheels;Bedside commode;Tub bench;Grab bars - toilet;Grab bars - tub/shower      Prior Function Level of Independence: Independent with assistive device(s)               Hand Dominance   Dominant Hand: Right    Extremity/Trunk Assessment   Upper Extremity Assessment Upper Extremity Assessment: Defer to OT evaluation    Lower Extremity Assessment Lower Extremity Assessment: Overall WFL for tasks assessed       Communication   Communication: No difficulties  Cognition Arousal/Alertness: Awake/alert Behavior During Therapy: WFL for tasks assessed/performed Overall Cognitive Status: Within Functional Limits for tasks assessed                                        General Comments General comments (skin integrity, edema, etc.): SpO2 on RA dropped to 86% with ambulation, back to 92% standing rest    Exercises     Assessment/Plan    PT Assessment Patient needs continued PT services  PT Problem List Decreased activity tolerance;Decreased  mobility;Cardiopulmonary status limiting activity       PT Treatment Interventions DME instruction;Gait training;Balance training;Functional mobility training;Therapeutic exercise;Patient/family education;Therapeutic activities    PT Goals (Current goals can be found in the Care Plan section)  Acute Rehab PT Goals Patient Stated Goal: To go home PT Goal Formulation: With patient Time For Goal Achievement: 11/25/16 Potential to Achieve Goals: Good    Frequency Min 3X/week   Barriers to discharge        Co-evaluation                AM-PAC PT "6 Clicks" Daily Activity  Outcome Measure Difficulty turning over in bed (including adjusting bedclothes, sheets and blankets)?: A Little Difficulty moving from lying on back to sitting on the side of the bed? : Total Difficulty sitting down on and standing up from a chair with arms (e.g., wheelchair, bedside commode, etc,.)?: A Little Help needed moving to and from a bed to chair (including a wheelchair)?: A Little Help needed walking in hospital room?: A Little Help needed climbing 3-5 steps with a railing? : A Lot 6 Click Score: 15    End of Session Equipment Utilized During Treatment: Oxygen;Gait belt Activity Tolerance: Patient limited by fatigue Patient left: in chair;with call bell/phone within reach   PT Visit Diagnosis: Muscle weakness (generalized) (M62.81)    Time: 2549-8264 PT Time Calculation (min) (ACUTE ONLY): 24 min   Charges:   PT Evaluation $PT Eval Moderate Complexity: 1 Procedure PT Treatments $Gait Training: 8-22 mins   PT G Codes:   PT G-Codes **NOT FOR INPATIENT CLASS** Functional Assessment Tool Used: AM-PAC 6 Clicks Basic Mobility Functional Limitation: Mobility: Walking and moving around Mobility: Walking and Moving Around Current Status ([id number]): At least 40 percent but less than 60 percent impaired, limited or restricted Mobility: Walking and Moving Around Goal Status [phone]): At least 20 percent but less than 40 percent impaired, limited or restricted    [place], [place] 11/18/2016   Reginia Naas 11/18/2016, 1:15 PM

## 2016-11-18 NOTE — Evaluation (Signed)
Occupational Therapy Evaluation Patient Details Name: Amanda Cain MRN: [id number] DOB: August 15, 1945 Today's Date: 11/18/2016    History of Present Illness 71 y.o. Female with significant of HTN, CAD, type 2 diabetes on oral medications, CKD stage III presented with dyspnea, orthopnea, wheeze, and increased swelling. Pt recently diagnosed with bronchitisand treated with prednisone which didn't help. Had increasing DOE and LE swelling. Saw her PCP, CXR showed pulmonary edema,   Clinical Impression   PATIENT STATES SHE HAS INCREASED WEAKNESS FROM BASELINE. PATIENT O2 SATS DROP INTO THE 80S WITH ADLS. PATIENT     REQUIRES EDUCATION ON PURSED LIP BREATHING. PATIENT WOULD BENEFIT FROM FURTHER OT FOR ENERGY CONSERVATION EDUCATION AND USE OF AE FOR ADLS. PATIENT IS IN AGREEMENT WITH THE PLAN OF CONTINUED OT.     Follow Up Recommendations  Home health OT    Equipment Recommendations  None recommended by OT    Recommendations for Other Services       Precautions / Restrictions Precautions Precautions: Fall Precaution Comments:  (o2) Restrictions Weight Bearing Restrictions: No      Mobility Bed Mobility                  Transfers Overall transfer level: Needs assistance   Transfers: Sit to/from Stand;Stand Pivot Transfers Sit to Stand: Supervision Stand pivot transfers: Min guard            Balance                                           ADL either performed or assessed with clinical judgement   ADL Overall ADL's : Needs assistance/impaired Eating/Feeding: Independent   Grooming: Wash/dry hands;Wash/dry face;Standing   Upper Body Bathing: Supervision/ safety;Set up;Sitting   Lower Body Bathing: Set up;Min guard;Sit to/from stand   Upper Body Dressing : Supervision/safety;Set up;Sitting   Lower Body Dressing: Set up;Min guard;Sit to/from stand   Toilet Transfer: Designer, fashion/clothing and Hygiene:  Supervision/safety       Functional mobility during ADLs: Supervision/safety;Min guard General ADL Comments: PATIENTS STATES SHE IS WEAKER NOW THAN SHE WAS AT HOME. PATIENT HAS DECREASED I AND SAFETY WITH ADLS AND MOBLITY.      Vision Baseline Vision/History: Wears glasses;Cataracts Wears Glasses: At all times Patient Visual Report: No change from baseline       Perception     Praxis      Pertinent Vitals/Pain Pain Assessment: No/denies pain     Hand Dominance Right   Extremity/Trunk Assessment Upper Extremity Assessment Upper Extremity Assessment: Overall WFL for tasks assessed           Communication Communication Communication: No difficulties   Cognition Arousal/Alertness: Awake/alert Behavior During Therapy: WFL for tasks assessed/performed Overall Cognitive Status: Within Functional Limits for tasks assessed                                     General Comments       Exercises     Shoulder Instructions      Home Living Family/patient expects to be discharged to:: Private residence Living Arrangements: Alone Available Help at Discharge: Family;Available PRN/intermittently Type of Home: Apartment Home Access: Level entry           Bathroom Shower/Tub: Teacher, early years/pre: Standard  Home Equipment: [place] - 2 wheels;Bedside commode;Tub bench;Grab bars - toilet;Grab bars - tub/shower          Prior Functioning/Environment Level of Independence: Independent with assistive device(s)                 OT Problem List:        OT Treatment/Interventions:      OT Goals(Current goals can be found in the care plan section) Acute Rehab OT Goals Patient Stated Goal:  (GO HOME) OT Goal Formulation: With patient Time For Goal Achievement: 12/02/16 Potential to Achieve Goals: Good ADL Goals Pt Will Perform Grooming: with modified independence;standing Pt Will Perform Upper Body Bathing: with modified  independence;sitting Pt Will Perform Lower Body Bathing: with modified independence;sit to/from stand Pt Will Perform Upper Body Dressing: with modified independence;sitting Pt Will Perform Lower Body Dressing: with modified independence;sit to/from stand;with adaptive equipment Pt Will Transfer to Toilet: with modified independence;ambulating;bedside commode Pt Will Perform Toileting - Clothing Manipulation and hygiene: with modified independence;sit to/from stand Pt Will Perform Tub/Shower Transfer: Tub transfer;ambulating;tub bench Additional ADL Goal #1: PATIENT TO BE EDUCATED AND BE ABLE TO RECITE 3 ENERGY CONSERVATION STRATEGIES. Additional ADL Goal #2: PATIENT TO BE EDUCATED AND BE ABLE TO RETURN DEMO PURSED LIP BREATHING.   OT Frequency: Min 2X/week   Barriers to D/C:            Co-evaluation              AM-PAC PT "6 Clicks" Daily Activity     Outcome Measure   Help from another person taking care of personal grooming?: None Help from another person toileting, which includes using toliet, bedpan, or urinal?: A Little Help from another person bathing (including washing, rinsing, drying)?: A Little Help from another person to put on and taking off regular upper body clothing?: A Little Help from another person to put on and taking off regular lower body clothing?: A Little 6 Click Score: 16   End of Session Equipment Utilized During Treatment: Gait belt  Activity Tolerance:   Patient left: in chair;with call bell/phone within reach  OT Visit Diagnosis: Unsteadiness on feet (R26.81)                Time: 8413-2440 OT Time Calculation (min): 38 min Charges:  OT General Charges $OT Visit: 1 Procedure OT Evaluation $OT Eval Low Complexity: 1 Procedure OT Treatments $Self Care/Home Management : 23-37 mins G-Codes:      6 CLICKS Amanda Cain 11/18/2016, 11:33 AM

## 2016-11-18 NOTE — Care Management Note (Signed)
Case Management Note  Patient Details  Name: Amanda Cain MRN: [id number] Date of Birth: [birth year]  Subjective/Objective:Patient active w/[employer] for nebs-contacted [employer] rep Estill Bamberg aware of possible need for home 02-they will be able to provide if ordered(they can pull orders from [employer]-just contact [facility]#[phone]. Will provide Private duty agency list as resource-custodial level care-has sister who can provide.                    Action/Plan:d/c plan home.   Expected Discharge Date:   (unknown)               Expected Discharge Plan:  Home/Self Care  In-House Referral:     Discharge planning Services  CM Consult  Post Acute Care Choice:  Durable Medical Equipment ([employer]-nebs) Choice offered to:     DME Arranged:    DME Agency:     HH Arranged:    HH Agency:     Status of Service:  In process, will continue to follow  If discussed at [facility] Length of Stay Meetings, dates discussed:    Additional Comments:  Dessa Phi, RN 11/18/2016, 11:19 AM

## 2016-11-19 DIAGNOSIS — I10 Essential (primary) hypertension: Secondary | ICD-10-CM

## 2016-11-19 DIAGNOSIS — E871 Hypo-osmolality and hyponatremia: Secondary | ICD-10-CM

## 2016-11-19 LAB — BASIC METABOLIC PANEL
Anion gap: 7 (ref 5–15)
BUN: 46 mg/dL — ABNORMAL HIGH (ref 6–20)
CO2: 31 mmol/L (ref 22–32)
Calcium: 8.2 mg/dL — ABNORMAL LOW (ref 8.9–10.3)
Chloride: 90 mmol/L — ABNORMAL LOW (ref 101–111)
Creatinine, Ser: 1.91 mg/dL — ABNORMAL HIGH (ref 0.44–1.00)
GFR calc Af Amer: 29 mL/min — ABNORMAL LOW (ref >60)
GFR calc non Af Amer: 25 mL/min — ABNORMAL LOW (ref >60)
Glucose, Bld: 133 mg/dL — ABNORMAL HIGH (ref 65–99)
Potassium: 4.8 mmol/L (ref 3.5–5.1)
Sodium: 128 mmol/L — ABNORMAL LOW (ref 135–145)

## 2016-11-19 LAB — CBC
HCT: 31.1 % — ABNORMAL LOW (ref 36.0–46.0)
Hemoglobin: 9.8 g/dL — ABNORMAL LOW (ref 12.0–15.0)
MCH: 27.8 pg (ref 26.0–34.0)
MCHC: 31.5 g/dL (ref 30.0–36.0)
MCV: 88.4 fL (ref 78.0–100.0)
Platelets: 307 10*3/uL (ref 150–400)
RBC: 3.52 MIL/uL — ABNORMAL LOW (ref 3.87–5.11)
RDW: 16.3 % — ABNORMAL HIGH (ref 11.5–15.5)
WBC: 10.9 10*3/uL — ABNORMAL HIGH (ref 4.0–10.5)

## 2016-11-19 LAB — GLUCOSE, CAPILLARY
Glucose-Capillary: 136 mg/dL — ABNORMAL HIGH (ref 65–99)
Glucose-Capillary: 163 mg/dL — ABNORMAL HIGH (ref 65–99)
Glucose-Capillary: 119 mg/dL — ABNORMAL HIGH (ref 65–99)
Glucose-Capillary: 173 mg/dL — ABNORMAL HIGH (ref 65–99)

## 2016-11-19 LAB — MAGNESIUM: Magnesium: 2.1 mg/dL (ref 1.7–2.4)

## 2016-11-19 MED ORDER — POLYETHYLENE GLYCOL 3350 17 G PO PACK
17.0000 g | PACK | Freq: Every day | ORAL | Status: DC
  Administered 2016-11-19: 17 g via ORAL
  Filled 2016-11-19: qty 1

## 2016-11-19 NOTE — Progress Notes (Addendum)
PROGRESS NOTE    Amanda Cain  PPI:[id number] DOB: 1945-12-07 DOA: 11/17/2016 PCP: Mayra Neer, MD   Brief Narrative: 71 year old female with history of hypertension, coronary artery disease, type 2 diabetes on oral medication, chronic kidney disease is stage III, recently treated with prednisone for possible bronchitis with no clinical improvement presented with dyspnea, wheezing and worsening lower extremities edema. She was found to have hypoxic to 84% in ER, Hyponatremia serum sodium level of 125. Chest x-ray with pulmonary edema. Treated with IV Lasix and admitted for CHF exacerbation.  Assessment & Plan:   # Acute on chronic diastolic congestive heart failure:  -Echocardiogram consistent with left ventricular EF of 06%, grade 2 diastolic dysfunction. There is focal calcification in the aortic valve concerning for possible endocarditis versus focal calcific degeneration.  -Cardiology consult appreciated. Follow up culture results. Diuretics on hold now because of elevated serum creatinine level. Clinically improving. Continue to monitor. Follow-up cardiologist for further intervention including possible cardiac cath.  -Patient is negative by 2.3 L since admission.  #Acute respiratory failure with hypoxia in the setting of CHF: Monitor. Try to wean off oxygen to room air. Continue bronchitis.  #Hypertension: Continue current home medications. Monitor blood pressure  #Borderline sinus bradycardia: On metoprolol and diltiazem. Clinically improving. Continue to monitor in telemetry.  #Type 2 diabetes: Continue sliding scale. Monitor blood sugar level.  #Hyponatremia likely in the setting of hydrochlorothiazide. I discontinued  thiazide. Serum sodium level 128 today. Repeat BMP.  #Ascites in the setting of CHF.  Ultrasound right upper quadrant unremarkable. Low-salt diet. Diuretics on hold today.  #History of asthma likely mild intermittent: Continue treatment for CHF as  above, continue bronchodilators.  #Acute kidney injury on Chronic kidney disease is stage III likely hemodynamically mediated in the setting of Lasix. Serum creatinine level is stable. Continue to hold diuretics. Monitor BMP.  #Hypothyroidism: Continue Synthroid. Patient is in high dose Synthroid. Recommended to follow up with PCP.  Obesity moderate: Follow up with PCP. PT OT evaluation  DVT prophylaxis: Lovenox subcutaneous Code Status: Full code Family Communication: Discussed with the patient's sister at bedside Disposition Plan: Likely discharge home in 1-2 days    Consultants:   Cardiologist  Procedures: Echocardiogram Antimicrobials: None  Subjective: Seen and examined at bedside. Feeling better. Denied chest pain, shortness of breath, nausea or vomiting.  Objective: Vitals:   11/18/16 0639 11/18/16 1405 11/18/16 2110 11/19/16 0424  BP:  (!) 135/53 (!) 130/53 139/77  Pulse:  62 (!) 57 (!) 56  Resp:  17 18 20   Temp:  97.8 F (36.6 C) 97.7 F (36.5 C) 98 F (36.7 C)  TempSrc:  Oral Oral Oral  SpO2: 95% 100% 100% 100%  Weight:    111.1 kg (245 lb)  Height:        Intake/Output Summary (Last 24 hours) at 11/19/16 1412 Last data filed at 11/19/16 1200  Gross per 24 hour  Intake              300 ml  Output             1350 ml  Net            -1050 ml   Filed Weights   11/17/16 0653 11/18/16 0600 11/19/16 0424  Weight: 110.9 kg (244 lb 7.8 oz) 110 kg (242 lb 8.1 oz) 111.1 kg (245 lb)    Examination:  General exam: Appears calm and comfortable  Respiratory system: Bibasal decreased breath sound. Respiratory effort normal. No wheezing  or crackle Cardiovascular system: S1 & S2 heard, RRR.  Gastrointestinal system: Abdomen is nondistended, soft and nontender. Normal bowel sounds heard. Central nervous system: Alert and oriented. No focal neurological deficits. Extremities: Nonpitting bilateral lower extremity edema improving.. Skin: No rashes, lesions or  ulcers    Data Reviewed: I have personally reviewed following labs and imaging studies  CBC:  Recent Labs Lab 11/17/16 0116 11/18/16 0748 11/19/16 0830  WBC 10.7* 12.0* 10.9*  NEUTROABS 8.7*  --   --   HGB 9.8* 10.0* 9.8*  HCT 29.9* 31.3* 31.1*  MCV 86.2 87.7 88.4  PLT 295 340 267   Basic Metabolic Panel:  Recent Labs Lab 11/17/16 0116 11/18/16 0748 11/19/16 0830  NA 125* 127* 128*  K 3.9 4.9 4.8  CL 89* 87* 90*  CO2 28 32 31  GLUCOSE 216* 187* 133*  BUN 23* 43* 46*  CREATININE 1.49* 2.02* 1.91*  CALCIUM 8.5* 8.7* 8.2*  MG  --   --  2.1   GFR: Estimated Creatinine Clearance: 29.6 mL/min (A) (by C-G formula based on SCr of 1.91 mg/dL (H)). Liver Function Tests:  Recent Labs Lab 11/18/16 0748  AST 26  ALT 30  ALKPHOS 60  BILITOT 0.4  PROT 7.2  ALBUMIN 3.9   No results for input(s): LIPASE, AMYLASE in the last 168 hours. No results for input(s): AMMONIA in the last 168 hours. Coagulation Profile:  Recent Labs Lab 11/18/16 0748  INR 1.02   Cardiac Enzymes: No results for input(s): CKTOTAL, CKMB, CKMBINDEX, TROPONINI in the last 168 hours. BNP (last 3 results) No results for input(s): PROBNP in the last 8760 hours. HbA1C:  Recent Labs  11/17/16 0116  HGBA1C 6.3*   CBG:  Recent Labs Lab 11/18/16 1154 11/18/16 1623 11/18/16 2019 11/19/16 0754 11/19/16 1204  GLUCAP 145* 171* 178* 136* 163*   Lipid Profile: No results for input(s): CHOL, HDL, LDLCALC, TRIG, CHOLHDL, LDLDIRECT in the last 72 hours. Thyroid Function Tests:  Recent Labs  11/17/16 1150  TSH 1.246   Anemia Panel: No results for input(s): VITAMINB12, FOLATE, FERRITIN, TIBC, IRON, RETICCTPCT in the last 72 hours. Sepsis Labs: No results for input(s): PROCALCITON, LATICACIDVEN in the last 168 hours.  No results found for this or any previous visit (from the past 240 hour(s)).       Radiology Studies: No results found.      Scheduled Meds: . atorvastatin  80  mg Oral Daily  . chlorhexidine  15 mL Mouth Rinse BID  . diltiazem  360 mg Oral Daily  . enoxaparin (LOVENOX) injection  30 mg Subcutaneous Q24H  . escitalopram  20 mg Oral QHS  . ferrous sulfate  325 mg Oral BID WC  . insulin aspart  0-15 Units Subcutaneous TID WC  . insulin aspart  0-5 Units Subcutaneous QHS  . levothyroxine  200 mcg Oral QAC breakfast  . levothyroxine  50 mcg Oral QAC breakfast  . mouth rinse  15 mL Mouth Rinse q12n4p  . metoprolol succinate  100 mg Oral q morning - 10a  . paliperidone  3 mg Oral QHS  . polyethylene glycol  17 g Oral Daily  . saccharomyces boulardii  250 mg Oral BID   Continuous Infusions:   LOS: 2 days    Joaquin Knebel Tanna Furry, MD [facility] Pager [phone]  If 7PM-7AM, please contact night-coverage [email or web address] Password [email or web address] 11/19/2016, 2:12 PM

## 2016-11-19 NOTE — Progress Notes (Signed)
Progress Note  Patient [name] Date of Encounter: 11/19/2016  Primary Cardiologist: Amanda Cain (new)  Subjective   Feeling better, less SOB, no CP  Inpatient Medications    Scheduled Meds: . atorvastatin  80 mg Oral Daily  . chlorhexidine  15 mL Mouth Rinse BID  . diltiazem  360 mg Oral Daily  . enoxaparin (LOVENOX) injection  30 mg Subcutaneous Q24H  . escitalopram  20 mg Oral QHS  . ferrous sulfate  325 mg Oral BID WC  . insulin aspart  0-15 Units Subcutaneous TID WC  . insulin aspart  0-5 Units Subcutaneous QHS  . levothyroxine  200 mcg Oral QAC breakfast  . levothyroxine  50 mcg Oral QAC breakfast  . mouth rinse  15 mL Mouth Rinse q12n4p  . metoprolol succinate  100 mg Oral q morning - 10a  . paliperidone  3 mg Oral QHS  . saccharomyces boulardii  250 mg Oral BID   Continuous Infusions:  PRN Meds: acetaminophen **OR** acetaminophen, albuterol, alum & mag hydroxide-simeth, LORazepam, ondansetron **OR** ondansetron (ZOFRAN) IV, polyvinyl alcohol   Vital Signs    Vitals:   11/18/16 0639 11/18/16 1405 11/18/16 2110 11/19/16 0424  BP:  (!) 135/53 (!) 130/53 139/77  Pulse:  62 (!) 57 (!) 56  Resp:  17 18 20   Temp:  97.8 F (36.6 C) 97.7 F (36.5 C) 98 F (36.7 C)  TempSrc:  Oral Oral Oral  SpO2: 95% 100% 100% 100%  Weight:    245 lb (111.1 kg)  Height:        Intake/Output Summary (Last 24 hours) at 11/19/16 0738 Last data filed at 11/19/16 0431  Gross per 24 hour  Intake              420 ml  Output              950 ml  Net             -530 ml   Filed Weights   11/17/16 0653 11/18/16 0600 11/19/16 0424  Weight: 244 lb 7.8 oz (110.9 kg) 242 lb 8.1 oz (110 kg) 245 lb (111.1 kg)    Telemetry    No adverse rhythms, brady low 50's (ectopic rhythm) - Personally Reviewed  ECG    NSR PVC - Personally Reviewed  Physical Exam   GEN: No acute distress.  Obese Neck: Thick Cardiac: brady reg Respiratory:normal effort GI: Soft, nontender,  non-distended  MS: No edema; No deformity. Neuro:  Nonfocal  Psych: Normal affect   Labs    Chemistry Recent Labs Lab 11/17/16 0116 11/18/16 0748  NA 125* 127*  K 3.9 4.9  CL 89* 87*  CO2 28 32  GLUCOSE 216* 187*  BUN 23* 43*  CREATININE 1.49* 2.02*  CALCIUM 8.5* 8.7*  PROT  --  7.2  ALBUMIN  --  3.9  AST  --  26  ALT  --  30  ALKPHOS  --  60  BILITOT  --  0.4  GFRNONAA 34* 23*  GFRAA 39* 27*  ANIONGAP 8 8     Hematology Recent Labs Lab 11/17/16 0116 11/18/16 0748  WBC 10.7* 12.0*  RBC 3.47* 3.57*  HGB 9.8* 10.0*  HCT 29.9* 31.3*  MCV 86.2 87.7  MCH 28.2 28.0  MCHC 32.8 31.9  RDW 15.9* 16.0*  PLT 295 340    Cardiac EnzymesNo results for input(s): TROPONINI in the last 168 hours.  Recent Labs Lab 11/17/16 0125  TROPIPOC 0.01  BNP Recent Labs Lab 11/17/16 0116  BNP 106.3*     DDimer No results for input(s): DDIMER in the last 168 hours.   Radiology    US Abdomen Limited Ruq  Result Date: 11/17/2016 CLINICAL DATA:  Ascites.  Evaluate liver EXAM: ULTRASOUND ABDOMEN LIMITED RIGHT UPPER QUADRANT COMPARISON:  Ultrasound 08/12/2014 FINDINGS: Gallbladder: Prior cholecystectomy Common bile duct: Diameter: Normal caliber, 4 mm Liver: No focal lesion identified. Within normal limits in parenchymal echogenicity. IMPRESSION: Unremarkable right upper quadrant ultrasound. Electronically Signed   By: Rolm Baptise M.D.   On: 11/17/2016 08:58    Cardiac Studies   Echocardiogram 11/17/16 Study Conclusions  - Left ventricle: The cavity size was normal. There was moderate concentric hypertrophy. Systolic function was normal. The estimated ejection fraction was in the range of 60% to 65%. Wall motion was normal; there were no regional wall motion abnormalities. Features are consistent with a pseudonormal left ventricular filling pattern, with concomitant abnormal relaxation and increased filling pressure (grade 2 diastolic  dysfunction). Doppler parameters are consistent with high ventricular filling pressure. - Aortic valve: Moderate focal calcification involving the left coronary cusp that measures 0.865 x 0.888 cm. This could represent endocarditis vs. focal calcific degeneration. Clinical correlation recommend. If endocarditis is a concer, recommend TEE. There was trivial regurgitation. - Mitral valve: Calcified annulus. - Pulmonic valve: There was trivial regurgitation.  Patient Profile     71 y.o. female with acute on chronic diastolic HF, AV calcification vs endocarditis,  DM, HTN, CKD with AKI, hyponatremia.  Assessment & Plan     - Improved clinically. Likely euvolemic. Creatinine increased after lasix.   - metoprolol  - Await results of blood cultures, doubt endocarditis  - Holding ARB with rise in creatinine --- worse today. 1.6 to 2.0  - Off HCTZ (low NA++)  Will follow.   Signed, Amanda Furbish, MD  11/19/2016, 7:38 AM

## 2016-11-20 LAB — GLUCOSE, CAPILLARY
Glucose-Capillary: 144 mg/dL — ABNORMAL HIGH (ref 65–99)
Glucose-Capillary: 126 mg/dL — ABNORMAL HIGH (ref 65–99)

## 2016-11-20 LAB — BASIC METABOLIC PANEL
Anion gap: 7 (ref 5–15)
BUN: 41 mg/dL — ABNORMAL HIGH (ref 6–20)
CO2: 32 mmol/L (ref 22–32)
Calcium: 8.7 mg/dL — ABNORMAL LOW (ref 8.9–10.3)
Chloride: 92 mmol/L — ABNORMAL LOW (ref 101–111)
Creatinine, Ser: 1.55 mg/dL — ABNORMAL HIGH (ref 0.44–1.00)
GFR calc Af Amer: 37 mL/min — ABNORMAL LOW (ref >60)
GFR calc non Af Amer: 32 mL/min — ABNORMAL LOW (ref >60)
Glucose, Bld: 153 mg/dL — ABNORMAL HIGH (ref 65–99)
Potassium: 4.8 mmol/L (ref 3.5–5.1)
Sodium: 131 mmol/L — ABNORMAL LOW (ref 135–145)

## 2016-11-20 MED ORDER — SACCHAROMYCES BOULARDII 250 MG PO CAPS: 250.0000 mg | ORAL_CAPSULE | Freq: Two times a day (BID) | ORAL | 0 refills | Status: DC

## 2016-11-20 MED ORDER — FUROSEMIDE 40 MG PO TABS: 40.0000 mg | ORAL_TABLET | Freq: Every day | ORAL | 11 refills | Status: DC

## 2016-11-20 NOTE — Discharge Summary (Signed)
Physician Discharge Summary  Amanda Cain HYI:[id number] DOB: 11-Jan-1946 DOA: 11/17/2016  PCP: Mayra Neer, MD  Admit date: 11/17/2016 Discharge date: 11/20/2016  Admitted From: Home Disposition:home with home care services  Recommendations for Outpatient Follow-up:  1. Follow up with PCP in 1-2 weeks 2. Please obtain BMP/CBC in one week   Home Health:yes Equipment/Devices:none Discharge Condition:stable CODE STATUS:full code Diet recommendation:carb modified heart healthy  Brief/Interim Summary: 71 year old female with history of hypertension, coronary artery disease, type 2 diabetes on oral medication, chronic kidney disease is stage III, recently treated with prednisone for possible bronchitis with no clinical improvement presented with dyspnea, wheezing and worsening lower extremities edema. She was found to have hypoxic to 84% in ER, Hyponatremia serum sodium level of 125. Chest x-ray with pulmonary edema. Treated with IV Lasix and admitted for CHF exacerbation.  # Acute on chronic diastolic congestive heart failure:  -Echocardiogram consistent with left ventricular EF of 78%, grade 2 diastolic dysfunction. There is focal calcification in the aortic valve concerning for possible endocarditis versus focal calcific degeneration. Cardiology consult appreciated. Blood culture negative so far. Patient clinically improved. She is afebrile. Cardiology signed off and 2, at outpatient follow-up. Hydrochlorothiazide discontinued because of hyponatremia. Switched to oral Lasix. Educated patient to weigh everyday and follow-up with cardiologist.  -Patient is negative by 4.6 L since admission. -Educated patient and family members to follow-up for test results including blood cultures with PCP or cardiologist.  #Acute respiratory failure with hypoxia in the setting of CHF: Clinically improved. She was able to ambulate with acceptable oxygen saturation in room air.  #Hypertension: Continue  current home medications. Monitor blood pressure  #Borderline sinus bradycardia: On metoprolol and diltiazem. Clinically improving. Recommended to monitor heart rate and blood pressure home.  #Type 2 diabetes: Continue home medication. Follow up with PCP.  #Hyponatremia likely in the setting of hydrochlorothiazide. Discontinue hydrochlorothiazide with improvement in serum sodium level. Switched to oral Lasix. I recommended patient to monitor BMP in a week with PCP or cardiologist.  #Ascites in the setting of CHF. Ultrasound right upper quadrant unremarkable. Low-salt diet. Continue diuretics.  #History of asthma likely mild intermittent: Continue treatment for CHF as above, continue bronchodilators.  #Acute kidney injury on Chronic kidney disease is stage III likely hemodynamically mediated in the setting of Lasix. Serum creatinine level improved. On oral Lasix. Recommended to monitor labs. Avoid nephrotoxins.  #Hypothyroidism: Continue Synthroid. Patient is in high dose Synthroid. Recommended to follow up with PCP.  Patient with significant clinical improvement. Sitting on chair comfortable able to ambulate. Sisters at bedside. Medically stable to discharge. Home care ordered.  Discharge Diagnoses:  Principal Problem:   Acute on chronic diastolic CHF (congestive heart failure) (HCC) Active Problems:   Obesity (BMI 30.0-34.9)   Hyponatremia   Anemia in other chronic diseases classified elsewhere   Moderate protein-calorie malnutrition (HCC)   Type 2 diabetes mellitus with complication, without long-term current use of insulin (HCC)   Essential hypertension   Hypothyroidism, acquired   CKD (chronic kidney disease), stage III   Ascites    Discharge Instructions  Discharge Instructions    (HEART FAILURE PATIENTS) Call MD:  Anytime you have any of the following symptoms: 1) 3 pound weight gain in 24 hours or 5 pounds in 1 week 2) shortness of breath, with or without a dry  hacking cough 3) swelling in the hands, feet or stomach 4) if you have to sleep on extra pillows at night in order to breathe.  Complete by:  As directed    Call MD for:  difficulty breathing, headache or visual disturbances    Complete by:  As directed    Call MD for:  extreme fatigue    Complete by:  As directed    Call MD for:  hives    Complete by:  As directed    Call MD for:  persistant dizziness or light-headedness    Complete by:  As directed    Call MD for:  persistant nausea and vomiting    Complete by:  As directed    Call MD for:  severe uncontrolled pain    Complete by:  As directed    Call MD for:  temperature >100.4    Complete by:  As directed    Diet - low sodium heart healthy    Complete by:  As directed    Diet Carb Modified    Complete by:  As directed    Discharge instructions    Complete by:  As directed    Follow up pending lab including kidney function and blood culture results with your PCP or cardiologist.   Increase activity slowly    Complete by:  As directed      Allergies as of 11/20/2016      Reactions   Aspirin    " Have an ulcer"      Medication List    STOP taking these medications   olmesartan-hydrochlorothiazide 40-25 MG tablet Commonly known as:  BENICAR HCT     TAKE these medications   acetaminophen 500 MG tablet Commonly known as:  TYLENOL Take 500 mg by mouth every 6 (six) hours as needed for mild pain.   albuterol 108 (90 Base) MCG/ACT inhaler Commonly known as:  PROVENTIL HFA;VENTOLIN HFA Inhale 2 puffs into the lungs every 4 (four) hours as needed for wheezing or shortness of breath.   albuterol (2.5 MG/3ML) 0.083% nebulizer solution Commonly known as:  PROVENTIL Take 2.5 mg by nebulization every 4 (four) hours as needed for wheezing or shortness of breath.   atorvastatin 80 MG tablet Commonly known as:  LIPITOR Take 1 tablet by mouth daily.   CALCIUM 1200 PO Take 1,200 mg by mouth daily.   diltiazem 360 MG 24 hr  capsule Commonly known as:  CARDIZEM CD Take 1 capsule by mouth daily.   escitalopram 20 MG tablet Commonly known as:  LEXAPRO Take 20 mg by mouth at bedtime.   estradiol 0.5 MG tablet Commonly known as:  ESTRACE Take 0.5 mg by mouth every morning.   ferrous sulfate 325 (65 FE) MG tablet Take 325 mg by mouth 2 (two) times daily with a meal.   fluticasone 50 MCG/ACT nasal spray Commonly known as:  FLONASE Place 1 spray into both nostrils daily.   furosemide 40 MG tablet Commonly known as:  LASIX Take 1 tablet (40 mg total) by mouth daily. What changed:  medication strength  how much to take   levothyroxine 25 MCG tablet Commonly known as:  SYNTHROID, LEVOTHROID Take 50 mcg by mouth daily before breakfast. Pt takes 250 mcg daily   levothyroxine 200 MCG tablet Commonly known as:  SYNTHROID, LEVOTHROID Take 200 mcg by mouth daily before breakfast.   metoprolol succinate 100 MG 24 hr tablet Commonly known as:  TOPROL-XL Take 100 mg by mouth every morning.   Misc Intestinal Flora Regulat Caps Take 1 capsule by mouth 3 (three) times daily with meals.   multivitamin with minerals Tabs tablet  Take 1 tablet by mouth daily.   paliperidone 3 MG 24 hr tablet Commonly known as:  INVEGA Take 3 mg by mouth at bedtime.   PAZEO 0.7 % Soln Generic drug:  Olopatadine HCl Place 1 drop into both eyes daily.   saccharomyces boulardii 250 MG capsule Commonly known as:  FLORASTOR Take 1 capsule (250 mg total) by mouth 2 (two) times daily.   sitaGLIPtin 50 MG tablet Commonly known as:  JANUVIA Take 50 mg by mouth daily.   sucralfate 1 g tablet Commonly known as:  CARAFATE Take 1 g by mouth 2 (two) times daily.   SYSTANE 0.4-0.3 % Soln Generic drug:  Polyethyl Glycol-Propyl Glycol Apply 1 drop to eye daily as needed (for dry eyes).   TRELEGY ELLIPTA 100-62.5-25 MCG/INH Aepb Generic drug:  Fluticasone-Umeclidin-Vilant Inhale 1 Inhaler into the lungs 2 (two) times daily.    vitamin B-12 1000 MCG tablet Commonly known as:  CYANOCOBALAMIN Take 1,000 mcg by mouth daily.   vitamin C 500 MG tablet Commonly known as:  ASCORBIC ACID Take 500 mg by mouth daily.      Follow-up Information    Mayra Neer, MD. Schedule an appointment as soon as possible for a visit in 1 week(s).   Specialty:  Family Medicine Contact information: [address] [place] [address] [phone]        Jettie Booze, MD. Schedule an appointment as soon as possible for a visit in 2 week(s).   Specialties:  Cardiology, Radiology, Interventional Cardiology Contact information: [address]  [place] [address] [phone]          Allergies  Allergen Reactions  . Aspirin     " Have an ulcer"    Consultations: Cardiologist  Procedures/Studies: Echocardiogram  Subjective: Seen and examined at bedside. Denied headache, dizziness, chest pain, shortness of breath, fever, nausea vomiting or abdominal pain. Sisters at bedside.  Discharge Exam: Vitals:   11/19/16 2002 11/20/16 0502  BP: (!) 156/60 (!) 150/60  Pulse: (!) 58 (!) 59  Resp: 18 (!) 24  Temp: 97.7 F (36.5 C) 97.8 F (36.6 C)   Vitals:   11/20/16 0345 11/20/16 0502 11/20/16 0509 11/20/16 1003  BP:  (!) 150/60    Pulse:  (!) 59    Resp:  (!) 24    Temp:  97.8 F (36.6 C)    TempSrc:  Oral    SpO2: 98% 91%  97%  Weight:   110.1 kg (242 lb 11.6 oz)   Height:        General: Pt is alert, awake, not in acute distress Cardiovascular: RRR, S1/S2 +, no rubs, no gallops Respiratory: CTA bilaterally, no wheezing, no rhonchi Abdominal: Soft, NT, ND, bowel sounds + Extremities: no edema, no cyanosis    The results of significant diagnostics from this hospitalization (including imaging, microbiology, ancillary and laboratory) are listed below for reference.     Microbiology: No results found for this or any previous visit (from the past 240  hour(s)).   Labs: BNP (last 3 results)  Recent Labs  11/17/16 0116  BNP 124.5*   Basic Metabolic Panel:  Recent Labs Lab 11/17/16 0116 11/18/16 0748 11/19/16 0830 11/20/16 0733  NA 125* 127* 128* 131*  K 3.9 4.9 4.8 4.8  CL 89* 87* 90* 92*  CO2 28 32 31 32  GLUCOSE 216* 187* 133* 153*  BUN 23* 43* 46* 41*  CREATININE 1.49* 2.02* 1.91* 1.55*  CALCIUM 8.5* 8.7*  8.2* 8.7*  MG  --   --  2.1  --    Liver Function Tests:  Recent Labs Lab 11/18/16 0748  AST 26  ALT 30  ALKPHOS 60  BILITOT 0.4  PROT 7.2  ALBUMIN 3.9   No results for input(s): LIPASE, AMYLASE in the last 168 hours. No results for input(s): AMMONIA in the last 168 hours. CBC:  Recent Labs Lab 11/17/16 0116 11/18/16 0748 11/19/16 0830  WBC 10.7* 12.0* 10.9*  NEUTROABS 8.7*  --   --   HGB 9.8* 10.0* 9.8*  HCT 29.9* 31.3* 31.1*  MCV 86.2 87.7 88.4  PLT 295 340 307   Cardiac Enzymes: No results for input(s): CKTOTAL, CKMB, CKMBINDEX, TROPONINI in the last 168 hours. BNP: Invalid input(s): POCBNP CBG:  Recent Labs Lab 11/19/16 0754 11/19/16 1204 11/19/16 1737 11/19/16 2115 11/20/16 0720  GLUCAP 136* 163* 119* 173* 144*   D-Dimer No results for input(s): DDIMER in the last 72 hours. Hgb A1c No results for input(s): HGBA1C in the last 72 hours. Lipid Profile No results for input(s): CHOL, HDL, LDLCALC, TRIG, CHOLHDL, LDLDIRECT in the last 72 hours. Thyroid function studies  Recent Labs  11/17/16 1150  TSH 1.246   Anemia work up No results for input(s): VITAMINB12, FOLATE, FERRITIN, TIBC, IRON, RETICCTPCT in the last 72 hours. Urinalysis    Component Value Date/Time   COLORURINE YELLOW 10/26/2012 0854   APPEARANCEUR CLEAR 10/26/2012 0854   LABSPEC 1.012 10/26/2012 0854   PHURINE 5.5 10/26/2012 0854   GLUCOSEU NEGATIVE 10/26/2012 0854   HGBUR NEGATIVE 10/26/2012 0854   BILIRUBINUR NEGATIVE 10/26/2012 0854   KETONESUR NEGATIVE 10/26/2012 0854   PROTEINUR NEGATIVE 10/26/2012  0854   UROBILINOGEN 0.2 10/26/2012 0854   NITRITE NEGATIVE 10/26/2012 0854   LEUKOCYTESUR NEGATIVE 10/26/2012 0854   Sepsis Labs Invalid input(s): PROCALCITONIN,  WBC,  LACTICIDVEN Microbiology No results found for this or any previous visit (from the past 240 hour(s)).   Time coordinating discharge: 34 minutes  SIGNED:   Rosita Fire, MD  [facility] 11/20/2016, 10:21 AM  If 7PM-7AM, please contact night-coverage [email or web address] Password [email or web address]

## 2016-11-20 NOTE — Progress Notes (Signed)
SATURATION QUALIFICATIONS: (This note is used to comply with regulatory documentation for home oxygen)  Patient Saturations on Room Air at Rest = 97-98%  Patient Saturations on Room Air while Ambulating = 97-98%  Patient Saturations on 0 Liters of oxygen while Ambulating = 97-98%  Please briefly explain why patient needs home oxygen: Patient does not qualify for Home O2 based off these readings.

## 2016-11-20 NOTE — Care Management Note (Addendum)
Case Management Note  Patient Details  Name: Amanda Cain MRN: [id number] Date of Birth: [birth year]  Subjective/Objective:      CHF, acute resp failure with hypoxia, DM, HTN              Action/Plan:  Discharge Planning: NCM spoke to pt and sister, [name] at bedside. Offered choice for HH/list provided. Pt requested [facility] for HH. Contacted rep with new referral. Contacted [facility] DME rep for 3n1 bedside commode for home. Equipment to be delivered to room prior to dc.  Pt states her sisters drive her to appts or [employer]. Pt will discuss with her PCP about completing [place] Medicaid PCS application to have aide that comes daily to assist in the home.    PCP Mayra Neer MD    Expected Discharge Date:  11/20/16               Expected Discharge Plan:  [place]  In-House Referral:  NA  Discharge planning Services  CM Consult  Post Acute Care Choice:  Home Health ([facility]) Choice offered to:  Patient  DME Arranged:  N/A DME Agency:  NA  HH Arranged:  PT, OT, Nurse's Aide [place] Agency:  [place]  Status of Service:  Completed, signed off  If discussed at [place] of Stay Meetings, dates discussed:    Additional Comments:  Erenest Rasher, RN 11/20/2016, 11:38 AM

## 2016-11-20 NOTE — Progress Notes (Addendum)
Progress Note  Patient Name: Amanda Cain Date of Encounter: 11/20/2016  Primary Cardiologist: Irish Lack (new)  Subjective   No CP, no SOB, looks good in chair. Family present  Inpatient Medications    Scheduled Meds: . atorvastatin  80 mg Oral Daily  . chlorhexidine  15 mL Mouth Rinse BID  . diltiazem  360 mg Oral Daily  . enoxaparin (LOVENOX) injection  30 mg Subcutaneous Q24H  . escitalopram  20 mg Oral QHS  . ferrous sulfate  325 mg Oral BID WC  . insulin aspart  0-15 Units Subcutaneous TID WC  . insulin aspart  0-5 Units Subcutaneous QHS  . levothyroxine  200 mcg Oral QAC breakfast  . levothyroxine  50 mcg Oral QAC breakfast  . mouth rinse  15 mL Mouth Rinse q12n4p  . metoprolol succinate  100 mg Oral q morning - 10a  . paliperidone  3 mg Oral QHS  . polyethylene glycol  17 g Oral Daily  . saccharomyces boulardii  250 mg Oral BID   Continuous Infusions:  PRN Meds: acetaminophen **OR** acetaminophen, albuterol, alum & mag hydroxide-simeth, LORazepam, ondansetron **OR** ondansetron (ZOFRAN) IV, polyvinyl alcohol   Vital Signs    Vitals:   11/20/16 0332 11/20/16 0345 11/20/16 0502 11/20/16 0509  BP:   (!) 150/60   Pulse:   (!) 59   Resp:   (!) 24   Temp:   97.8 F (36.6 C)   TempSrc:   Oral   SpO2: 98% 98% 91%   Weight:    242 lb 11.6 oz (110.1 kg)  Height:        Intake/Output Summary (Last 24 hours) at 11/20/16 0851 Last data filed at 11/20/16 0800  Gross per 24 hour  Intake              720 ml  Output             3250 ml  Net            -2530 ml   Filed Weights   11/18/16 0600 11/19/16 0424 11/20/16 0509  Weight: 242 lb 8.1 oz (110 kg) 245 lb (111.1 kg) 242 lb 11.6 oz (110.1 kg)    Telemetry    No adverse rhythms, brady low 50's (ectopic rhythm) - Personally Reviewed  ECG    NSR PVC - Personally Reviewed  Physical Exam  GEN: Well nourished, well developed, in no acute distress  HEENT: normal  Neck: no JVD, carotid bruits, or  masses Cardiac: RRR; Soft systolic murmur, no rubs, or gallops,no edema  Respiratory:  clear to auscultation bilaterally, normal work of breathing GI: soft, nontender, nondistended, + BS obese MS: no deformity or atrophy  Skin: warm and dry, no rash Neuro:  Alert and Oriented x 3, Strength and sensation are intact Psych: euthymic mood, full affect   Labs    Chemistry  Recent Labs Lab 11/17/16 0116 11/18/16 0748 11/19/16 0830  NA 125* 127* 128*  K 3.9 4.9 4.8  CL 89* 87* 90*  CO2 28 32 31  GLUCOSE 216* 187* 133*  BUN 23* 43* 46*  CREATININE 1.49* 2.02* 1.91*  CALCIUM 8.5* 8.7* 8.2*  PROT  --  7.2  --   ALBUMIN  --  3.9  --   AST  --  26  --   ALT  --  30  --   ALKPHOS  --  60  --   BILITOT  --  0.4  --   [facility]  34* 23* 25*  GFRAA 39* 27* 29*  ANIONGAP 8 8 7      Hematology  Recent Labs Lab 11/17/16 0116 11/18/16 0748 11/19/16 0830  WBC 10.7* 12.0* 10.9*  RBC 3.47* 3.57* 3.52*  HGB 9.8* 10.0* 9.8*  HCT 29.9* 31.3* 31.1*  MCV 86.2 87.7 88.4  MCH 28.2 28.0 27.8  MCHC 32.8 31.9 31.5  RDW 15.9* 16.0* 16.3*  PLT 295 340 307    Cardiac EnzymesNo results for input(s): TROPONINI in the last 168 hours.   Recent Labs Lab 11/17/16 0125  TROPIPOC 0.01     BNP  Recent Labs Lab 11/17/16 0116  BNP 106.3*     DDimer No results for input(s): DDIMER in the last 168 hours.   Radiology    No results found.  Cardiac Studies   Echocardiogram 11/17/16 Study Conclusions  - Left ventricle: The cavity size was normal. There was moderate concentric hypertrophy. Systolic function was normal. The estimated ejection fraction was in the range of 60% to 65%. Wall motion was normal; there were no regional wall motion abnormalities. Features are consistent with a pseudonormal left ventricular filling pattern, with concomitant abnormal relaxation and increased filling pressure (grade 2 diastolic dysfunction). Doppler parameters are consistent with  high ventricular filling pressure. - Aortic valve: Moderate focal calcification involving the left coronary cusp that measures 0.865 x 0.888 cm. This could represent endocarditis vs. focal calcific degeneration. Clinical correlation recommend. If endocarditis is a concer, recommend TEE. There was trivial regurgitation. - Mitral valve: Calcified annulus. - Pulmonic valve: There was trivial regurgitation.  Patient Profile     71 y.o. female with acute on chronic diastolic HF, AV calcification,  DM, HTN, CKD with AKI, hyponatremia.  Assessment & Plan     - Improved clinically. Likely euvolemic. Creatinine increased after lasix. No changes  - metoprolol, diltiazem - continue  - Await results of blood cultures, at this point NGTD no evidence of endocarditis  - Holding ARB with rise in creatinine  - Off HCTZ (low NA++) improved. Fluid restrict. Would promote low salt, 1L of fluid when at home.   Will sign off. Does not need to get outpatient echo sched for Friday. Had here. Will set up with follow up in our clinic with Dr. Irish Lack or APP.   Signed, Candee Furbish, MD  11/20/2016, 8:51 AM

## 2016-11-20 NOTE — Progress Notes (Signed)
Occupational Therapy Treatment Patient Details Name: Amanda Cain MRN: [id number] DOB: [birth year] Today's Date: 11/20/2016    History of present illness 71 y.o. Female with significant of HTN, CAD, type 2 diabetes on oral medications, CKD stage III presented with dyspnea, orthopnea, wheeze, and increased swelling. Pt recently diagnosed with bronchitisand treated with prednisone which didn't help. Had increasing DOE and LE swelling. Saw her PCP, CXR showed pulmonary edema,   OT comments  Pt doing well this session. Pt up in room for functional mobility and O2 sats on RA was 92-95% with activity. Educated further on energy conservation techniques and issued handout with this information as well as on purse lip breathing. Family present for session.   Follow Up Recommendations  Home health OT    Equipment Recommendations  None recommended by OT    Recommendations for Other Services      Precautions / Restrictions Precautions Precautions: Fall Precaution Comments: monitor O2 Restrictions Weight Bearing Restrictions: No       Mobility Bed Mobility               General bed mobility comments: up in chair.   Transfers Overall transfer level: Needs assistance Equipment used: None Transfers: Sit to/from Stand Sit to Stand: Supervision              Balance                                           ADL either performed or assessed with clinical judgement   ADL                           Toilet Transfer: Supervision/safety;Ambulation             General ADL Comments: Educated pt on energy conservation techniques as  well as purse lip breathing. pt able to return demonstrate purse lip breathing with min cues. Pt up in room to practice tub transfer with supervision using grab bar and O2 at 92-95% with activity on RA. Pt with 1-2/4 dyspnea after activity so cued pt to rest and utilize purse lip breathing technique and she did well with  this. Issued energy conservation handout and reviewed with pt.      Vision Patient Visual Report: No change from baseline     Perception     Praxis      Cognition Arousal/Alertness: Awake/alert Behavior During Therapy: WFL for tasks assessed/performed Overall Cognitive Status: Within Functional Limits for tasks assessed                                          Exercises     Shoulder Instructions       General Comments      Pertinent Vitals/ Pain       Pain Assessment: No/denies pain  Home Living                                          Prior Functioning/Environment              Frequency  Min 2X/week        Progress Toward Goals  OT  Goals(current goals can now be found in the care plan section)  Progress towards OT goals: Progressing toward goals  Acute Rehab OT Goals Patient Stated Goal: To go home  Plan Discharge plan remains appropriate    Co-evaluation                 AM-PAC PT "6 Clicks" Daily Activity     Outcome Measure   Help from another person eating meals?: None Help from another person taking care of personal grooming?: None Help from another person toileting, which includes using toliet, bedpan, or urinal?: A Little Help from another person bathing (including washing, rinsing, drying)?: A Little Help from another person to put on and taking off regular upper body clothing?: None Help from another person to put on and taking off regular lower body clothing?: A Little 6 Click Score: 21    End of Session    OT Visit Diagnosis: Muscle weakness (generalized) (M62.81)   Activity Tolerance Patient tolerated treatment well   Patient Left in chair;with call bell/phone within reach;with family/visitor present   Nurse Communication          Time: 7225-7505 OT Time Calculation (min): 20 min  Charges: OT General Charges $OT Visit: 1 Procedure OT Treatments $Therapeutic Activity: 8-22  mins     Amanda Cain 11/20/2016, 9:23 AM

## 2016-11-20 NOTE — Care Management Important Message (Signed)
Important Message  Patient Details  Name: [name] MRN: [id number] Date of Birth: 1945-07-06   Medicare Important Message Given:  Yes    Erenest Rasher, RN 11/20/2016, 11:35 AM

## 2016-11-24 LAB — CULTURE, BLOOD (ROUTINE X 2)
Culture: NO GROWTH
Culture: NO GROWTH
Special Requests: ADEQUATE
Special Requests: ADEQUATE

## 2016-11-25 ENCOUNTER — Other Ambulatory Visit (HOSPITAL_COMMUNITY): Payer: Medicare Other

## 2016-11-28 ENCOUNTER — Encounter (HOSPITAL_COMMUNITY): Payer: Self-pay

## 2016-11-28 ENCOUNTER — Ambulatory Visit (HOSPITAL_COMMUNITY): Admit: 2016-11-28 | Payer: Medicare Other | Admitting: Gastroenterology

## 2016-11-28 SURGERY — COLONOSCOPY WITH PROPOFOL
Anesthesia: Monitor Anesthesia Care

## 2016-12-06 ENCOUNTER — Telehealth: Payer: Self-pay | Admitting: Interventional Cardiology

## 2016-12-06 NOTE — Telephone Encounter (Signed)
Left message for patient to call back  

## 2016-12-06 NOTE — Telephone Encounter (Signed)
New message    Pt is calling to ask if she can drink gatorade. She said she was told she needs electrolytes but all she knows to have is gatorade.

## 2016-12-07 NOTE — Telephone Encounter (Signed)
Patient calling and states that she has been coughing and she feels like her fluid is not moving. She states that she saw Dr. Brigitte Pulse and she is taking some cough medicine. Patient states that she has not had any weight gain or swelling. She states that she is a little SOB, but no worse than she has been. Patient states that Dr. Brigitte Pulse instructed her to take only a half a tablet (20 mg) of her lasix because of her worsening kidney function. Patient asking if she can drink Gatorade. Patient was instructed to limit her salt intake and maintain a 1 L fluid restriction. Patient advised to monitor her weight and let us know if she gains 3 lbs in 1 day or 5 lbs in 1 week. Patient also advised to let us know if her symptoms worsen. Patient also advised to keep her appointment on 7/16 with Ermalinda Barrios. Patient verbalized understanding and is in agreement with this plan.

## 2016-12-12 ENCOUNTER — Ambulatory Visit (INDEPENDENT_AMBULATORY_CARE_PROVIDER_SITE_OTHER): Payer: Medicare Other | Admitting: Physician Assistant

## 2016-12-12 ENCOUNTER — Telehealth: Payer: Self-pay | Admitting: Physician Assistant

## 2016-12-12 ENCOUNTER — Encounter: Payer: Self-pay | Admitting: Physician Assistant

## 2016-12-12 VITALS — BP 160/80 | HR 60 | Ht 59.0 in | Wt 229.6 lb

## 2016-12-12 DIAGNOSIS — I10 Essential (primary) hypertension: Secondary | ICD-10-CM | POA: Diagnosis not present

## 2016-12-12 DIAGNOSIS — N183 Chronic kidney disease, stage 3 unspecified: Secondary | ICD-10-CM

## 2016-12-12 DIAGNOSIS — E669 Obesity, unspecified: Secondary | ICD-10-CM | POA: Diagnosis not present

## 2016-12-12 DIAGNOSIS — I5032 Chronic diastolic (congestive) heart failure: Secondary | ICD-10-CM | POA: Diagnosis not present

## 2016-12-12 DIAGNOSIS — E785 Hyperlipidemia, unspecified: Secondary | ICD-10-CM | POA: Diagnosis not present
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
## 2016-12-12 NOTE — Telephone Encounter (Signed)
New message    Pt is calling to ask if she can still take her cough medicine. She states she forgot to ask when she was just here. Please call.

## 2016-12-12 NOTE — Telephone Encounter (Signed)
Patient is taking mucinex and per Nena Polio it is ok for patient to take

## 2016-12-12 NOTE — Patient Instructions (Signed)
Medication Instructions:  Your physician recommends that you continue on your current medications as directed. Please refer to the Current Medication list given to you today.   Labwork: TODAY BMET  Testing/Procedures: NONE ORDERED TODAY  Follow-Up: DR. VARANASI IN 2 MONTHS   Any Other Special Instructions Will Be Listed Below (If Applicable). PER MICHELLE LENZE, PAC TODAY OK TO INCREASE FLUID INTAKE (WATER, JUICE) TO 1 AND 1/2 LITERS PER DAY  LIMIT GATORADE TO 4 OZ PER DAY    If you need a refill on your cardiac medications before your next appointment, please call your pharmacy.

## 2016-12-13 LAB — BASIC METABOLIC PANEL
BUN/Creatinine Ratio: 11 — ABNORMAL LOW (ref 12–28)
BUN: 16 mg/dL (ref 8–27)
CO2: 28 mmol/L (ref 20–29)
Calcium: 9.9 mg/dL (ref 8.7–10.3)
Chloride: 94 mmol/L — ABNORMAL LOW (ref 96–106)
Creatinine, Ser: 1.44 mg/dL — ABNORMAL HIGH (ref 0.57–1.00)
GFR calc Af Amer: 42 mL/min/{1.73_m2} — ABNORMAL LOW (ref >59)
GFR calc non Af Amer: 36 mL/min/{1.73_m2} — ABNORMAL LOW (ref >59)
Glucose: 89 mg/dL (ref 65–99)
Potassium: 4.2 mmol/L (ref 3.5–5.2)
Sodium: 138 mmol/L (ref 134–144)

## 2016-12-20 ENCOUNTER — Ambulatory Visit (INDEPENDENT_AMBULATORY_CARE_PROVIDER_SITE_OTHER): Payer: Medicare Other | Admitting: Podiatry

## 2016-12-20 DIAGNOSIS — B351 Tinea unguium: Secondary | ICD-10-CM

## 2016-12-20 DIAGNOSIS — E119 Type 2 diabetes mellitus without complications: Secondary | ICD-10-CM

## 2016-12-20 DIAGNOSIS — M79676 Pain in unspecified toe(s): Secondary | ICD-10-CM | POA: Diagnosis not present

## 2016-12-20 NOTE — Patient Instructions (Signed)

## 2016-12-20 NOTE — Progress Notes (Signed)
Patient ID: Amanda Cain, female   DOB: 1945-11-28, 71 y.o.   MRN: [id number]    Subjective: This patient presents today stating that her toenails that are thickened and elongated and are comfortable and she walking wearing shoes and requesting nail debridement  Objective: Orientated 3 No open skin lesions bilaterally The toenails are hypertrophic, elongated, brittle, discolored and tender to direct palpation 6-10 DP and PT pulses 2/4 bilaterally Capillary reflex immediate bilaterally Absent hair growth bilaterally Sensation to 10 g monofilament wire intact 5/5 bilaterally Vibratory sensation equal and reactive bilaterally Ankle reflex equal and reactive bilaterally Pes planus bilaterally HAV deformities bilaterally  Assessment: Symptomatic onychomycoses 6-10 Diabetic without complications  Plan: Debridement toenails 6-10 mechanically an electricallywithout any bleeding  Reappoint 3 months

## 2017-01-02 ENCOUNTER — Telehealth: Payer: Self-pay | Admitting: Interventional Cardiology

## 2017-01-02 NOTE — Telephone Encounter (Signed)
Reviewed with Dr Rayann Heman:  Per Dr Berneice Heinrich extra lasix 20 mg today, continue daily weights, monitor symptoms, call if no improvement, forward to Dr Irish Lack.

## 2017-01-02 NOTE — Telephone Encounter (Signed)
New Message    Pt c/o swelling: STAT is pt has developed SOB within 24 hours  1. How long have you been experiencing swelling?  Since yesterday spoke with on call last night   2. Where is the swelling located?  Swelling in stomach, lost 2lbs last night, she was at 4lbs   3.  Are you currently taking a "fluid pill" yes  4.  Are you currently SOB?   A little   5.  Have you traveled recently no

## 2017-01-02 NOTE — Telephone Encounter (Signed)
Pt advised to take extra lasix 20 mg today, continue daily weights, monitor symptoms, call in no improvement in weight or symptoms.

## 2017-01-02 NOTE — Telephone Encounter (Signed)
Pt calling with weights the last few days:  12/30/16 227 lbs 12/31/16 230 lbs  01/01/17 231 lbs 01/02/17  229 lbs  Pt talked with on call doctor yesterday, she was told to take lasix 40 mg yesterday instead of her usual 20 mg. Pt states today shortness of breath is better, no LE edema today, abdomen better but  still a little swollen.

## 2017-01-02 NOTE — Telephone Encounter (Signed)
Our call was disconnected, no answer when I call patient back.

## 2017-01-04 ENCOUNTER — Telehealth: Payer: Self-pay | Admitting: Interventional Cardiology

## 2017-01-04 NOTE — Telephone Encounter (Signed)
Patient calling and states that her weight yesterday was 227 lbs and today is 231 lbs. Patient states that she has been SOB and has had slight swelling in her LE and has had some swelling in her abdomen. The patient has called in over the past week where she has been instructed to increase her furosemide from 20 mg QD to 40 mg QD for one day. The patient states that it helps when she takes the extra 20 mg but when she doesn't she starts to gain weight and becomes SOB again. Patient was taking furosemide 40 mg QD but she states that her PCP decreased her to 20 mg QD due to her Cr being elevated. The patient has CKD. Last Cr on file was 1.44. Discussed with DOD Dr. Burt Knack who advised for the patient to take an extra 40 mg of lasix today and resume taking 40 mg QD tomorrow and to schedule an appointment with an APP in a few weeks to be evaluated and to recheck labs. Made patient aware of recommendations. Reiterated the need to limit her salt intake and to limit restrict herself to a 1 L fluid/day. Patient verbalized understanding. Appointment made with Ermalinda Barrios on 9/4.

## 2017-01-04 NOTE — Telephone Encounter (Signed)
Amanda Cain is calling because the fluid is building up again in her stomach . Please call

## 2017-01-31 ENCOUNTER — Ambulatory Visit (INDEPENDENT_AMBULATORY_CARE_PROVIDER_SITE_OTHER): Payer: Medicare Other | Admitting: Physician Assistant

## 2017-01-31 ENCOUNTER — Encounter: Payer: Self-pay | Admitting: Physician Assistant

## 2017-01-31 ENCOUNTER — Encounter (INDEPENDENT_AMBULATORY_CARE_PROVIDER_SITE_OTHER): Payer: Self-pay

## 2017-01-31 VITALS — BP 140/72 | HR 64 | Ht 59.0 in | Wt 229.0 lb

## 2017-01-31 DIAGNOSIS — I10 Essential (primary) hypertension: Secondary | ICD-10-CM | POA: Diagnosis not present

## 2017-01-31 DIAGNOSIS — I5033 Acute on chronic diastolic (congestive) heart failure: Secondary | ICD-10-CM

## 2017-01-31 DIAGNOSIS — N183 Chronic kidney disease, stage 3 unspecified: Secondary | ICD-10-CM

## 2017-01-31 DIAGNOSIS — E669 Obesity, unspecified: Secondary | ICD-10-CM

## 2017-01-31 LAB — BASIC METABOLIC PANEL
BUN/Creatinine Ratio: 11 — ABNORMAL LOW (ref 12–28)
BUN: 15 mg/dL (ref 8–27)
CO2: 27 mmol/L (ref 20–29)
Calcium: 9.7 mg/dL (ref 8.7–10.3)
Chloride: 94 mmol/L — ABNORMAL LOW (ref 96–106)
Creatinine, Ser: 1.42 mg/dL — ABNORMAL HIGH (ref 0.57–1.00)
GFR calc Af Amer: 42 mL/min/{1.73_m2} — ABNORMAL LOW (ref >59)
GFR calc non Af Amer: 37 mL/min/{1.73_m2} — ABNORMAL LOW (ref >59)
Glucose: 117 mg/dL — ABNORMAL HIGH (ref 65–99)
Potassium: 3.8 mmol/L (ref 3.5–5.2)
Sodium: 140 mmol/L (ref 134–144)

## 2017-01-31 LAB — PRO B NATRIURETIC PEPTIDE: NT-Pro BNP: 11 pg/mL (ref 0–301)

## 2017-01-31 NOTE — Patient Instructions (Addendum)
Medication Instructions: Your physician recommends that you continue on your current medications as directed. Please refer to the Current Medication list given to you today. - You make take and extra Furosemide (Lasix) 40 mg tablet today for your swelling. Continue 1 tablet (40 mg) daily after today   Labwork: Your physician recommends that you have lab work today: BMET and BNP  Procedures/Testing: None Ordered  Follow-Up: Your physician recommends that you keep your scheduled follow up appointment with Dr. Irish Lack March 18, 2017   If you need a refill on your cardiac medications before your next appointment, please call your pharmacy.
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
## 2017-02-03 ENCOUNTER — Telehealth: Payer: Self-pay | Admitting: Physician Assistant

## 2017-02-03 NOTE — Telephone Encounter (Signed)
New message   Pt verbalized that she wants a referral to see a GI doctor not Dr.Schooler is who she has now and she was   instructed that she needs a colonoscopy and she dont want it done by him please call pt with your recommendation

## 2017-02-03 NOTE — Telephone Encounter (Signed)
Returned pts call and I have provided her with a couple GI drs names and she thanked me for my call.

## 2017-02-07 ENCOUNTER — Telehealth: Payer: Self-pay | Admitting: Interventional Cardiology

## 2017-02-07 NOTE — Telephone Encounter (Signed)
New Message  Harmon Pier , Nurse [facility] call requesting the injection fraction for pt. Please call back to discuss

## 2017-02-07 NOTE — Telephone Encounter (Signed)
Pt had echo 11/17/2016.  Will route to Farley Ly in medical records.

## 2017-02-08 NOTE — Telephone Encounter (Signed)
Called # back that was given in Epic states it is not a correct number.

## 2017-02-27 ENCOUNTER — Ambulatory Visit (INDEPENDENT_AMBULATORY_CARE_PROVIDER_SITE_OTHER): Payer: Medicare Other | Admitting: Pulmonary Disease

## 2017-02-27 ENCOUNTER — Encounter: Payer: Self-pay | Admitting: Pulmonary Disease

## 2017-02-27 VITALS — BP 124/84 | HR 54 | Ht 59.0 in | Wt 226.0 lb

## 2017-02-27 DIAGNOSIS — Z23 Encounter for immunization: Secondary | ICD-10-CM | POA: Diagnosis not present

## 2017-02-27 DIAGNOSIS — R0602 Shortness of breath: Secondary | ICD-10-CM | POA: Diagnosis not present

## 2017-02-27 MED ORDER — FLUTICASONE-UMECLIDIN-VILANT 100-62.5-25 MCG/INH IN AEPB: 1.0000 | INHALATION_SPRAY | RESPIRATORY_TRACT | 0 refills | Status: AC

## 2017-02-27 MED ORDER — PANTOPRAZOLE SODIUM 40 MG PO TBEC: 40.0000 mg | DELAYED_RELEASE_TABLET | Freq: Every day | ORAL | 1 refills | Status: DC

## 2017-02-27 MED ORDER — MONTELUKAST SODIUM 10 MG PO TABS: 10.0000 mg | ORAL_TABLET | Freq: Every day | ORAL | 1 refills | Status: DC

## 2017-02-27 NOTE — Patient Instructions (Signed)
We will get pulmonary function test Continue using the Flonase. We'll add Singulair once daily for control of allergies Continue using sucralfate. We'll add Protonix 40 mg once daily for control of acid reflux Continue the current inhalers I will review her lung function test and determine if any adjustments need to be made Follow-up in 1-2 months.
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
## 2017-03-07 ENCOUNTER — Encounter: Payer: Self-pay | Admitting: Interventional Cardiology

## 2017-03-07 ENCOUNTER — Ambulatory Visit (INDEPENDENT_AMBULATORY_CARE_PROVIDER_SITE_OTHER): Payer: Medicare Other | Admitting: Interventional Cardiology

## 2017-03-07 VITALS — BP 118/62 | HR 65 | Ht 59.0 in | Wt 231.2 lb

## 2017-03-07 DIAGNOSIS — Z79899 Other long term (current) drug therapy: Secondary | ICD-10-CM

## 2017-03-07 DIAGNOSIS — Z6841 Body Mass Index (BMI) 40.0 and over, adult: Secondary | ICD-10-CM | POA: Diagnosis not present

## 2017-03-07 DIAGNOSIS — N183 Chronic kidney disease, stage 3 unspecified: Secondary | ICD-10-CM

## 2017-03-07 DIAGNOSIS — I10 Essential (primary) hypertension: Secondary | ICD-10-CM

## 2017-03-07 DIAGNOSIS — I5033 Acute on chronic diastolic (congestive) heart failure: Secondary | ICD-10-CM | POA: Diagnosis not present

## 2017-03-07 DIAGNOSIS — E782 Mixed hyperlipidemia: Secondary | ICD-10-CM

## 2017-03-07 MED ORDER — POTASSIUM CHLORIDE CRYS ER 20 MEQ PO TBCR: 20.0000 meq | EXTENDED_RELEASE_TABLET | Freq: Every day | ORAL | 3 refills | Status: DC

## 2017-03-07 NOTE — Patient Instructions (Addendum)
Medication Instructions:  Your physician has recommended you make the following change in your medication:   1. START potassium 20 mEq (1 tablet) once daily  2. TAKE furosemide (lasix) 40 mg (1 tablet) TWICE  A DAY for 3 DAYS, then go back to taking 40 mg (1 tablet) ONCE A DAY  Labwork: Your physician recommends that you return for lab work in: 2 weeks for BMET  Testing/Procedures: None ordered  Follow-Up: Your physician wants you to follow-up in: 3 months with Dr. Irish Lack or and APP on his team.   Any Other Special Instructions Will Be Listed Below (If Applicable).     If you need a refill on your cardiac medications before your next appointment, please call your pharmacy.

## 2017-03-15 ENCOUNTER — Telehealth: Payer: Self-pay | Admitting: Interventional Cardiology

## 2017-03-15 NOTE — Telephone Encounter (Signed)
New message    Fax [phone]  1) Are you calling to confirm a diagnosis or obtain personal health information (Y/N)?  yes  2) If so, what information is requested? Ejection fracture   Please route to Medical Records or your medical records site representative

## 2017-03-16 NOTE — Telephone Encounter (Signed)
VM left For Katina Degree with [facility] asking her to fax over her EF % Form

## 2017-03-20 ENCOUNTER — Ambulatory Visit (INDEPENDENT_AMBULATORY_CARE_PROVIDER_SITE_OTHER): Payer: Medicare Other | Admitting: Podiatry

## 2017-03-20 ENCOUNTER — Encounter: Payer: Self-pay | Admitting: Podiatry

## 2017-03-20 DIAGNOSIS — M79676 Pain in unspecified toe(s): Secondary | ICD-10-CM | POA: Diagnosis not present

## 2017-03-20 DIAGNOSIS — B351 Tinea unguium: Secondary | ICD-10-CM | POA: Diagnosis not present

## 2017-03-20 DIAGNOSIS — E119 Type 2 diabetes mellitus without complications: Secondary | ICD-10-CM | POA: Diagnosis not present

## 2017-03-20 NOTE — Progress Notes (Signed)
Patient ID: Amanda Cain, female   DOB: [birth year], 71 y.o.   MRN: [id number]    Subjective: This patient presents today stating that her toenails that are thickened and elongated and are comfortable and she walking wearing shoes and requesting nail debridement  Objective: Orientated 3 No open skin lesions bilaterally The toenails are hypertrophic, elongated, brittle, discolored and tender to direct palpation 6-10 DP and PT pulses 2/4 bilaterally Capillary reflex immediate bilaterally Absent hair growth bilaterally Sensation to 10 g monofilament wire intact 5/5 bilaterally Vibratory sensation equal and reactive bilaterally Ankle reflex equal and reactive bilaterally Pes planus bilaterally HAV deformities bilaterally Manual motor testing dorsi flexion, plantar flexion 5/5 bilaterally  Assessment: Symptomatic onychomycoses 6-10 Diabetic without complications  Plan: Debridement toenails 6-10 mechanically an electricallywithout any bleeding  Reappoint 3 months

## 2017-03-20 NOTE — Patient Instructions (Signed)

## 2017-03-21 ENCOUNTER — Ambulatory Visit: Payer: Medicare Other | Admitting: Podiatry

## 2017-03-21 ENCOUNTER — Other Ambulatory Visit: Payer: Medicare Other

## 2017-03-24 ENCOUNTER — Other Ambulatory Visit: Payer: Medicare Other

## 2017-03-27 ENCOUNTER — Other Ambulatory Visit: Payer: Medicare Other

## 2017-03-27 DIAGNOSIS — Z79899 Other long term (current) drug therapy: Secondary | ICD-10-CM

## 2017-03-28 LAB — BASIC METABOLIC PANEL
BUN/Creatinine Ratio: 11 — ABNORMAL LOW (ref 12–28)
BUN: 18 mg/dL (ref 8–27)
CO2: 28 mmol/L (ref 20–29)
Calcium: 9.4 mg/dL (ref 8.7–10.3)
Chloride: 95 mmol/L — ABNORMAL LOW (ref 96–106)
Creatinine, Ser: 1.67 mg/dL — ABNORMAL HIGH (ref 0.57–1.00)
GFR calc Af Amer: 35 mL/min/{1.73_m2} — ABNORMAL LOW (ref >59)
GFR calc non Af Amer: 30 mL/min/{1.73_m2} — ABNORMAL LOW (ref >59)
Glucose: 78 mg/dL (ref 65–99)
Potassium: 4.2 mmol/L (ref 3.5–5.2)
Sodium: 138 mmol/L (ref 134–144)

## 2017-03-29 ENCOUNTER — Other Ambulatory Visit: Payer: Self-pay

## 2017-03-29 DIAGNOSIS — R0602 Shortness of breath: Secondary | ICD-10-CM

## 2017-03-30 ENCOUNTER — Ambulatory Visit: Payer: Medicare Other | Admitting: Pulmonary Disease

## 2017-04-05 ENCOUNTER — Ambulatory Visit (INDEPENDENT_AMBULATORY_CARE_PROVIDER_SITE_OTHER): Payer: Medicare Other | Admitting: Pulmonary Disease

## 2017-04-05 ENCOUNTER — Encounter: Payer: Self-pay | Admitting: Pulmonary Disease

## 2017-04-05 ENCOUNTER — Other Ambulatory Visit (INDEPENDENT_AMBULATORY_CARE_PROVIDER_SITE_OTHER): Payer: Medicare Other

## 2017-04-05 ENCOUNTER — Ambulatory Visit (INDEPENDENT_AMBULATORY_CARE_PROVIDER_SITE_OTHER): Payer: Medicare Other

## 2017-04-05 VITALS — BP 128/82 | HR 67 | Ht 59.0 in | Wt 225.0 lb

## 2017-04-05 DIAGNOSIS — J4530 Mild persistent asthma, uncomplicated: Secondary | ICD-10-CM

## 2017-04-05 DIAGNOSIS — R0602 Shortness of breath: Secondary | ICD-10-CM

## 2017-04-05 LAB — CBC WITH DIFFERENTIAL/PLATELET
Basophils Absolute: 0.1 10*3/uL (ref 0.0–0.1)
Basophils Relative: 0.6 % (ref 0.0–3.0)
Eosinophils Absolute: 0.3 10*3/uL (ref 0.0–0.7)
Eosinophils Relative: 3 % (ref 0.0–5.0)
HCT: 35.7 % — ABNORMAL LOW (ref 36.0–46.0)
Hemoglobin: 11.4 g/dL — ABNORMAL LOW (ref 12.0–15.0)
Lymphocytes Relative: 13.7 % (ref 12.0–46.0)
Lymphs Abs: 1.3 10*3/uL (ref 0.7–4.0)
MCHC: 32 g/dL (ref 30.0–36.0)
MCV: 83.7 fl (ref 78.0–100.0)
Monocytes Absolute: 0.8 10*3/uL (ref 0.1–1.0)
Monocytes Relative: 8.5 % (ref 3.0–12.0)
Neutro Abs: 6.8 10*3/uL (ref 1.4–7.7)
Neutrophils Relative %: 74.2 % (ref 43.0–77.0)
Platelets: 353 10*3/uL (ref 150.0–400.0)
RBC: 4.27 Mil/uL (ref 3.87–5.11)
RDW: 17.8 % — ABNORMAL HIGH (ref 11.5–15.5)
WBC: 9.2 10*3/uL (ref 4.0–10.5)
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
## 2017-04-05 NOTE — Patient Instructions (Addendum)
I am glad your breathing and cough is better. Continue the current medication. Check CBC with differential and blood allergy profile Will reevaluate in 3 months and decide if any change is needed.  Please call us sooner if there is any change in his symptoms.

## 2017-04-06 LAB — RESPIRATORY ALLERGY PROFILE REGION II ~~LOC~~
Allergen, A. alternata, m6: <0.1 kU/L
Allergen, Cedar tree, t12: <0.1 kU/L
Allergen, Comm Silver Birch, t9: <0.1 kU/L
Allergen, Cottonwood, t14: <0.1 kU/L
Allergen, D pternoyssinus,d7: <0.1 kU/L
Allergen, Mouse Urine Protein, e78: <0.1 kU/L
Allergen, Mulberry, t76: <0.1 kU/L
Allergen, Oak,t7: <0.1 kU/L
Allergen, P. notatum, m1: <0.1 kU/L
Aspergillus fumigatus, m3: <0.1 kU/L
Bermuda Grass: <0.1 kU/L
Box Elder IgE: <0.1 kU/L
CLADOSPORIUM HERBARUM (M2) IGE: <0.1 kU/L
COMMON RAGWEED (SHORT) (W1) IGE: <0.1 kU/L
Cat Dander: <0.1 kU/L
Class: 0
Class: 0
Class: 0
Class: 0
Class: 0
Class: 0
Class: 0
Class: 0
Class: 0
Class: 0
Class: 0
Class: 0
Class: 0
Class: 0
Class: 0
Class: 0
Class: 0
Class: 0
Class: 0
Class: 0
Class: 0
Class: 0
Class: 0
Class: 0
Cockroach: <0.1 kU/L
D. farinae: <0.1 kU/L
Dog Dander: <0.1 kU/L
Elm IgE: <0.1 kU/L
IgE (Immunoglobulin E), Serum: 133 kU/L — ABNORMAL HIGH (ref <=114)
Johnson Grass: <0.1 kU/L
Pecan/Hickory Tree IgE: <0.1 kU/L
Rough Pigweed  IgE: <0.1 kU/L
Sheep Sorrel IgE: <0.1 kU/L
Timothy Grass: <0.1 kU/L

## 2017-04-06 LAB — INTERPRETATION:

## 2017-04-22 ENCOUNTER — Other Ambulatory Visit: Payer: Self-pay | Admitting: Pulmonary Disease

## 2017-04-27 ENCOUNTER — Telehealth: Payer: Self-pay

## 2017-04-27 ENCOUNTER — Other Ambulatory Visit: Payer: Self-pay

## 2017-04-27 NOTE — Telephone Encounter (Signed)
-----   Message from Jettie Booze, MD sent at 04/26/2017  6:55 PM EST ----- LDL increased.  Is she taking her atorvastatin?

## 2017-04-27 NOTE — Telephone Encounter (Signed)
Called and spoke to patient and made her aware of her lab results. Patient states that she has been taking her atorvastatin 80 mg QD. She states that Dr. Brigitte Pulse started her on zetia 10 mg QD and is going to recheck labs in 3 months. Made patient aware that I would forward information to JV to make him aware. Patient verbalized understanding and thanked me for the call.

## 2017-05-31 ENCOUNTER — Telehealth: Payer: Self-pay | Admitting: Interventional Cardiology

## 2017-05-31 NOTE — Telephone Encounter (Signed)
Follow Up:; ° ° °Returning your call. °

## 2017-05-31 NOTE — Telephone Encounter (Signed)
Attempted to return call to patient but there was no answer and VM not available.

## 2017-05-31 NOTE — Telephone Encounter (Signed)
Patient calling and states that her weight increased from 221 lbs to 226 lbs in 1 week and that she was having LE swelling. She denies SOB. Patient states that she saw Dr. Laurann Montana on Friday and was instructed to increase her lasix to 40 mg BID and potassium 68mEq BID x 3 days and then go back to lasix 40 mg QD and potassium 20 mEq QD and to have her labs checked. Patient calling to see what she should do about her lasix and potassium. Patient states that she has not had her labs rechecked yet but is going to next week at her follow up appointment. Patient states that she followed these instructions from Dr. Laurann Montana and that her weight is down to 223 lbs now and her swelling is a little better. Advised patient to keep her appointment with PCP and seek their advice regarding the medication change since she was just evaluated by them on Friday and they can look at her lab results. Patient instructed to let us know if her symptoms worsen. Patient verbalized understanding and thanked me for the call.

## 2017-05-31 NOTE — Telephone Encounter (Signed)
New message  Pt c/o medication issue:  1. Name of Medication: Potassium  2. How are you currently taking this medication (dosage and times per day)? 20mg /day  3. Are you having a reaction (difficulty breathing--STAT)? Yes weight is fluctuating  4. What is your medication issue? Weight fluctuating, retraining water   Patient wants to know if she needs to continue the potassium

## 2017-06-16 ENCOUNTER — Other Ambulatory Visit: Payer: Self-pay | Admitting: Gastroenterology

## 2017-06-19 ENCOUNTER — Other Ambulatory Visit: Payer: Self-pay | Admitting: Pulmonary Disease

## 2017-06-19 ENCOUNTER — Ambulatory Visit: Payer: Medicare Other | Admitting: Podiatry

## 2017-06-29 ENCOUNTER — Telehealth: Payer: Self-pay | Admitting: Interventional Cardiology

## 2017-06-29 NOTE — Telephone Encounter (Signed)
New message  [facility] calling with patient on the line as well to report swelling. Please call patient.   Pt c/o swelling: STAT is pt has developed SOB within 24 hours  1) How much weight have you gained and in what time span? 3.8lbs in 5 days  2) If swelling, where is the swelling located? feet  3) Are you currently taking a fluid pill? yes  4) Are you currently SOB? Sob on exertion  5) Do you have a log of your daily weights (if so, list)? 226.4  6) Have you gained 3 pounds in a day or 5 pounds in a week? yes  7) Have you traveled recently? no

## 2017-06-29 NOTE — Telephone Encounter (Signed)
Returned call to patient who states that she has gained weight and that her weight went from 222 lbs up to 226 lbs. Patient states that she has swelling in her lower extremities and that she is SOB on exertion. Patient takes lasix 40 mg QD and K-dur 20 mEq QD. Patient states that she was suppose to have her kidney function checked at her PCP last week but did not have a ride. Patient's last Cr- 1.7, K- 4.1 on 06/06/17 according to [facility]. Instructed patient to elevate legs, wear compression stockings, minimize the amount of salt in her diet, and continue to do daily weights.  Discussed meds with Dr. Irish Lack, okay to double up on lasix and K x 3 days and return to normal. Instructed patient to have her PCP check her labs on Monday. Patient verbalized understanding and thanked me for the call.

## 2017-07-05 ENCOUNTER — Inpatient Hospital Stay (HOSPITAL_COMMUNITY)
Admission: EM | Admit: 2017-07-05 | Discharge: 2017-07-08 | DRG: 193 | Disposition: A | Discharge status: Home / Self Care | Payer: Medicare Other | Attending: Internal Medicine | Admitting: Internal Medicine

## 2017-07-05 ENCOUNTER — Encounter (HOSPITAL_COMMUNITY): Payer: Self-pay | Admitting: *Deleted

## 2017-07-05 ENCOUNTER — Other Ambulatory Visit: Payer: Self-pay

## 2017-07-05 ENCOUNTER — Emergency Department (HOSPITAL_COMMUNITY): Payer: Medicare Other

## 2017-07-05 DIAGNOSIS — J101 Influenza due to other identified influenza virus with other respiratory manifestations: Secondary | ICD-10-CM | POA: Diagnosis not present

## 2017-07-05 DIAGNOSIS — K219 Gastro-esophageal reflux disease without esophagitis: Secondary | ICD-10-CM | POA: Diagnosis present

## 2017-07-05 DIAGNOSIS — F419 Anxiety disorder, unspecified: Secondary | ICD-10-CM | POA: Diagnosis present

## 2017-07-05 DIAGNOSIS — E1122 Type 2 diabetes mellitus with diabetic chronic kidney disease: Secondary | ICD-10-CM | POA: Diagnosis present

## 2017-07-05 DIAGNOSIS — E118 Type 2 diabetes mellitus with unspecified complications: Secondary | ICD-10-CM

## 2017-07-05 DIAGNOSIS — Z9049 Acquired absence of other specified parts of digestive tract: Secondary | ICD-10-CM

## 2017-07-05 DIAGNOSIS — N183 Chronic kidney disease, stage 3 unspecified: Secondary | ICD-10-CM | POA: Diagnosis present

## 2017-07-05 DIAGNOSIS — R0602 Shortness of breath: Secondary | ICD-10-CM | POA: Diagnosis not present

## 2017-07-05 DIAGNOSIS — J9601 Acute respiratory failure with hypoxia: Secondary | ICD-10-CM | POA: Diagnosis present

## 2017-07-05 DIAGNOSIS — E860 Dehydration: Secondary | ICD-10-CM | POA: Diagnosis present

## 2017-07-05 DIAGNOSIS — E039 Hypothyroidism, unspecified: Secondary | ICD-10-CM | POA: Diagnosis present

## 2017-07-05 DIAGNOSIS — I130 Hypertensive heart and chronic kidney disease with heart failure and stage 1 through stage 4 chronic kidney disease, or unspecified chronic kidney disease: Secondary | ICD-10-CM | POA: Diagnosis present

## 2017-07-05 DIAGNOSIS — I509 Heart failure, unspecified: Secondary | ICD-10-CM | POA: Diagnosis present

## 2017-07-05 DIAGNOSIS — F329 Major depressive disorder, single episode, unspecified: Secondary | ICD-10-CM | POA: Diagnosis present

## 2017-07-05 DIAGNOSIS — Z96651 Presence of right artificial knee joint: Secondary | ICD-10-CM | POA: Diagnosis present

## 2017-07-05 DIAGNOSIS — I10 Essential (primary) hypertension: Secondary | ICD-10-CM | POA: Diagnosis not present

## 2017-07-05 DIAGNOSIS — N184 Chronic kidney disease, stage 4 (severe): Secondary | ICD-10-CM | POA: Diagnosis present

## 2017-07-05 DIAGNOSIS — J111 Influenza due to unidentified influenza virus with other respiratory manifestations: Secondary | ICD-10-CM | POA: Diagnosis present

## 2017-07-05 DIAGNOSIS — E7800 Pure hypercholesterolemia, unspecified: Secondary | ICD-10-CM | POA: Diagnosis present

## 2017-07-05 DIAGNOSIS — Z90710 Acquired absence of both cervix and uterus: Secondary | ICD-10-CM

## 2017-07-05 DIAGNOSIS — Z794 Long term (current) use of insulin: Secondary | ICD-10-CM

## 2017-07-05 DIAGNOSIS — J9621 Acute and chronic respiratory failure with hypoxia: Secondary | ICD-10-CM | POA: Diagnosis present

## 2017-07-05 DIAGNOSIS — Z79899 Other long term (current) drug therapy: Secondary | ICD-10-CM

## 2017-07-05 LAB — CBC WITH DIFFERENTIAL/PLATELET
Basophils Absolute: 0 10*3/uL (ref 0.0–0.1)
Basophils Relative: 0 %
Eosinophils Absolute: 0.1 10*3/uL (ref 0.0–0.7)
Eosinophils Relative: 1 %
HCT: 31.3 % — ABNORMAL LOW (ref 36.0–46.0)
Hemoglobin: 10.1 g/dL — ABNORMAL LOW (ref 12.0–15.0)
Lymphocytes Relative: 17 %
Lymphs Abs: 1.1 10*3/uL (ref 0.7–4.0)
MCH: 27 pg (ref 26.0–34.0)
MCHC: 32.3 g/dL (ref 30.0–36.0)
MCV: 83.7 fL (ref 78.0–100.0)
Monocytes Absolute: 0.9 10*3/uL (ref 0.1–1.0)
Monocytes Relative: 15 %
Neutro Abs: 4.2 10*3/uL (ref 1.7–7.7)
Neutrophils Relative %: 67 %
Platelets: 246 10*3/uL (ref 150–400)
RBC: 3.74 MIL/uL — ABNORMAL LOW (ref 3.87–5.11)
RDW: 16.5 % — ABNORMAL HIGH (ref 11.5–15.5)
WBC: 6.2 10*3/uL (ref 4.0–10.5)

## 2017-07-05 LAB — BASIC METABOLIC PANEL
Anion gap: 10 (ref 5–15)
BUN: 24 mg/dL — ABNORMAL HIGH (ref 6–20)
CO2: 27 mmol/L (ref 22–32)
Calcium: 8.6 mg/dL — ABNORMAL LOW (ref 8.9–10.3)
Chloride: 96 mmol/L — ABNORMAL LOW (ref 101–111)
Creatinine, Ser: 1.95 mg/dL — ABNORMAL HIGH (ref 0.44–1.00)
GFR calc Af Amer: 28 mL/min — ABNORMAL LOW (ref >60)
GFR calc non Af Amer: 24 mL/min — ABNORMAL LOW (ref >60)
Glucose, Bld: 121 mg/dL — ABNORMAL HIGH (ref 65–99)
Potassium: 3.5 mmol/L (ref 3.5–5.1)
Sodium: 133 mmol/L — ABNORMAL LOW (ref 135–145)

## 2017-07-05 LAB — BRAIN NATRIURETIC PEPTIDE: B Natriuretic Peptide: 81.5 pg/mL (ref 0.0–100.0)

## 2017-07-05 LAB — I-STAT TROPONIN, ED: Troponin i, poc: 0.02 ng/mL (ref 0.00–0.08)

## 2017-07-05 LAB — CBG MONITORING, ED: Glucose-Capillary: 299 mg/dL — ABNORMAL HIGH (ref 65–99)

## 2017-07-05 LAB — INFLUENZA PANEL BY PCR (TYPE A & B)
Influenza A By PCR: POSITIVE — AB
Influenza B By PCR: NEGATIVE

## 2017-07-05 MED ORDER — MONTELUKAST SODIUM 10 MG PO TABS
10.0000 mg | ORAL_TABLET | Freq: Every day | ORAL | Status: DC
  Administered 2017-07-05 – 2017-07-07 (×3): 10 mg via ORAL
  Filled 2017-07-05 (×3): qty 1

## 2017-07-05 MED ORDER — LINAGLIPTIN 5 MG PO TABS
5.0000 mg | ORAL_TABLET | Freq: Every day | ORAL | Status: DC
  Administered 2017-07-06 – 2017-07-08 (×3): 5 mg via ORAL
  Filled 2017-07-05 (×3): qty 1

## 2017-07-05 MED ORDER — POLYVINYL ALCOHOL 1.4 % OP SOLN: 1.0000 [drp] | Freq: Every day | OPHTHALMIC | Status: DC | PRN

## 2017-07-05 MED ORDER — PANTOPRAZOLE SODIUM 40 MG PO TBEC
40.0000 mg | DELAYED_RELEASE_TABLET | Freq: Every day | ORAL | Status: DC
  Administered 2017-07-06 – 2017-07-08 (×3): 40 mg via ORAL
  Filled 2017-07-05 (×3): qty 1

## 2017-07-05 MED ORDER — ALBUTEROL SULFATE HFA 108 (90 BASE) MCG/ACT IN AERS: 2.0000 | INHALATION_SPRAY | RESPIRATORY_TRACT | Status: DC | PRN

## 2017-07-05 MED ORDER — ACETAMINOPHEN 650 MG RE SUPP: 650.0000 mg | Freq: Four times a day (QID) | RECTAL | Status: DC | PRN

## 2017-07-05 MED ORDER — ADULT MULTIVITAMIN W/MINERALS CH
1.0000 | ORAL_TABLET | Freq: Every day | ORAL | Status: DC
  Administered 2017-07-07 – 2017-07-08 (×2): 1 via ORAL
  Filled 2017-07-05 (×2): qty 1

## 2017-07-05 MED ORDER — ALBUTEROL SULFATE (2.5 MG/3ML) 0.083% IN NEBU: 2.5000 mg | INHALATION_SOLUTION | RESPIRATORY_TRACT | Status: DC | PRN

## 2017-07-05 MED ORDER — ENOXAPARIN SODIUM 30 MG/0.3ML ~~LOC~~ SOLN: 30.0000 mg | SUBCUTANEOUS | Status: DC

## 2017-07-05 MED ORDER — SODIUM CHLORIDE 0.9 % IV SOLN: INTRAVENOUS | Status: DC

## 2017-07-05 MED ORDER — OLOPATADINE HCL 0.7 % OP SOLN: 1.0000 [drp] | Freq: Every day | OPHTHALMIC | Status: DC

## 2017-07-05 MED ORDER — FUROSEMIDE 40 MG PO TABS: 40.0000 mg | ORAL_TABLET | Freq: Every day | ORAL | Status: DC

## 2017-07-05 MED ORDER — VITAMIN D3 25 MCG (1000 UNIT) PO TABS
1000.0000 [IU] | ORAL_TABLET | Freq: Every day | ORAL | Status: DC
  Administered 2017-07-06 – 2017-07-08 (×3): 1000 [IU] via ORAL
  Filled 2017-07-05 (×3): qty 1

## 2017-07-05 MED ORDER — FERROUS SULFATE 325 (65 FE) MG PO TABS
325.0000 mg | ORAL_TABLET | Freq: Two times a day (BID) | ORAL | Status: DC
  Administered 2017-07-06 – 2017-07-08 (×5): 325 mg via ORAL
  Filled 2017-07-05 (×6): qty 1

## 2017-07-05 MED ORDER — POTASSIUM CHLORIDE CRYS ER 20 MEQ PO TBCR
20.0000 meq | EXTENDED_RELEASE_TABLET | Freq: Every day | ORAL | Status: DC
  Administered 2017-07-06 – 2017-07-08 (×3): 20 meq via ORAL
  Filled 2017-07-05 (×3): qty 1

## 2017-07-05 MED ORDER — METOPROLOL SUCCINATE ER 50 MG PO TB24
100.0000 mg | ORAL_TABLET | Freq: Every morning | ORAL | Status: DC
  Administered 2017-07-06 – 2017-07-08 (×3): 100 mg via ORAL
  Filled 2017-07-05 (×2): qty 2
  Filled 2017-07-05: qty 1

## 2017-07-05 MED ORDER — ALBUTEROL SULFATE (2.5 MG/3ML) 0.083% IN NEBU
5.0000 mg | INHALATION_SOLUTION | Freq: Once | RESPIRATORY_TRACT | Status: AC
  Administered 2017-07-05: 5 mg via RESPIRATORY_TRACT
  Filled 2017-07-05: qty 6

## 2017-07-05 MED ORDER — ESCITALOPRAM OXALATE 20 MG PO TABS
20.0000 mg | ORAL_TABLET | Freq: Every day | ORAL | Status: DC
  Administered 2017-07-05 – 2017-07-07 (×3): 20 mg via ORAL
  Filled 2017-07-05: qty 1
  Filled 2017-07-05: qty 2
  Filled 2017-07-05: qty 1

## 2017-07-05 MED ORDER — ONDANSETRON HCL 4 MG/2ML IJ SOLN: 4.0000 mg | Freq: Four times a day (QID) | INTRAMUSCULAR | Status: DC | PRN

## 2017-07-05 MED ORDER — ESTRADIOL 1 MG PO TABS
0.5000 mg | ORAL_TABLET | Freq: Every morning | ORAL | Status: DC
  Administered 2017-07-06 – 2017-07-08 (×3): 0.5 mg via ORAL
  Filled 2017-07-05 (×3): qty 0.5

## 2017-07-05 MED ORDER — ONDANSETRON HCL 4 MG PO TABS: 4.0000 mg | ORAL_TABLET | Freq: Four times a day (QID) | ORAL | Status: DC | PRN

## 2017-07-05 MED ORDER — OSELTAMIVIR PHOSPHATE 30 MG PO CAPS
30.0000 mg | ORAL_CAPSULE | Freq: Two times a day (BID) | ORAL | Status: DC
  Administered 2017-07-06 (×2): 30 mg via ORAL
  Filled 2017-07-05 (×3): qty 1

## 2017-07-05 MED ORDER — LEVOTHYROXINE SODIUM 50 MCG PO TABS
250.0000 ug | ORAL_TABLET | Freq: Every day | ORAL | Status: DC
  Administered 2017-07-06 – 2017-07-08 (×3): 250 ug via ORAL
  Filled 2017-07-05: qty 1
  Filled 2017-07-05: qty 2
  Filled 2017-07-05: qty 1

## 2017-07-05 MED ORDER — PALIPERIDONE ER 3 MG PO TB24
3.0000 mg | ORAL_TABLET | Freq: Every day | ORAL | Status: DC
  Administered 2017-07-05 – 2017-07-07 (×3): 3 mg via ORAL
  Filled 2017-07-05 (×3): qty 1

## 2017-07-05 MED ORDER — EZETIMIBE 10 MG PO TABS
10.0000 mg | ORAL_TABLET | Freq: Every day | ORAL | Status: DC
  Administered 2017-07-06 – 2017-07-08 (×3): 10 mg via ORAL
  Filled 2017-07-05 (×3): qty 1

## 2017-07-05 MED ORDER — LORAZEPAM 0.5 MG PO TABS
0.5000 mg | ORAL_TABLET | Freq: Two times a day (BID) | ORAL | Status: DC
  Administered 2017-07-05 – 2017-07-08 (×6): 0.5 mg via ORAL
  Filled 2017-07-05 (×6): qty 1

## 2017-07-05 MED ORDER — ATORVASTATIN CALCIUM 40 MG PO TABS
80.0000 mg | ORAL_TABLET | Freq: Every day | ORAL | Status: DC
  Administered 2017-07-06 – 2017-07-08 (×3): 80 mg via ORAL
  Filled 2017-07-05: qty 1
  Filled 2017-07-05 (×2): qty 2

## 2017-07-05 MED ORDER — ACETAMINOPHEN 500 MG PO TABS: 500.0000 mg | ORAL_TABLET | Freq: Four times a day (QID) | ORAL | Status: DC | PRN

## 2017-07-05 MED ORDER — SACCHAROMYCES BOULARDII 250 MG PO CAPS
250.0000 mg | ORAL_CAPSULE | Freq: Two times a day (BID) | ORAL | Status: DC
  Administered 2017-07-06 – 2017-07-08 (×5): 250 mg via ORAL
  Filled 2017-07-05 (×5): qty 1

## 2017-07-05 MED ORDER — METHYLPREDNISOLONE SODIUM SUCC 125 MG IJ SOLR
125.0000 mg | Freq: Once | INTRAMUSCULAR | Status: AC
Start: 2017-07-05 — End: 2017-07-05
  Administered 2017-07-05: 125 mg via INTRAVENOUS
  Filled 2017-07-05: qty 2

## 2017-07-05 MED ORDER — FLUTICASONE-UMECLIDIN-VILANT 100-62.5-25 MCG/INH IN AEPB: 1.0000 | INHALATION_SPRAY | Freq: Two times a day (BID) | RESPIRATORY_TRACT | Status: DC

## 2017-07-05 MED ORDER — ACETAMINOPHEN 325 MG PO TABS
650.0000 mg | ORAL_TABLET | Freq: Four times a day (QID) | ORAL | Status: DC | PRN
  Filled 2017-07-05: qty 2

## 2017-07-05 MED ORDER — OSELTAMIVIR PHOSPHATE 75 MG PO CAPS
75.0000 mg | ORAL_CAPSULE | Freq: Once | ORAL | Status: AC
  Administered 2017-07-05: 75 mg via ORAL
  Filled 2017-07-05: qty 1

## 2017-07-05 MED ORDER — SUCRALFATE 1 G PO TABS
1.0000 g | ORAL_TABLET | Freq: Two times a day (BID) | ORAL | Status: DC
  Administered 2017-07-05 – 2017-07-08 (×6): 1 g via ORAL
  Filled 2017-07-05 (×6): qty 1

## 2017-07-05 MED ORDER — FLUTICASONE PROPIONATE 50 MCG/ACT NA SUSP
1.0000 | Freq: Every day | NASAL | Status: DC
  Administered 2017-07-06 – 2017-07-08 (×3): 1 via NASAL
  Filled 2017-07-05: qty 16

## 2017-07-05 MED ORDER — DILTIAZEM HCL ER COATED BEADS 180 MG PO CP24
360.0000 mg | ORAL_CAPSULE | Freq: Every day | ORAL | Status: DC
  Administered 2017-07-06 – 2017-07-08 (×3): 360 mg via ORAL
  Filled 2017-07-05 (×3): qty 2

## 2017-07-05 NOTE — ED Triage Notes (Addendum)
Pt sent from doctors office by EMS, [facility] over a week, has been on meds without improvement. Has history of CHF and feels she is retaining fluid. Duo neb given in MD's office and en route by EMS 161/67- 22-100% with duo neb-[id number] 129
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
## 2017-07-05 NOTE — ED Provider Notes (Signed)
Medical screening examination/treatment/procedure(s) were conducted as a shared visit with non-physician practitioner(s) and myself.  I personally evaluated the patient during the encounter. Briefly, the patient is a 72 year old female here with cough, shortness of breath, sputum production, and wheezing.  Denies history of COPD but did respond to breathing treatments at her PCP.  On exam, she appears mildly hypervolemic, but history is more consistent with possible pneumonia with component of reactive airway disease.  She may also have a component of CHF, but given her known sick contacts and sputum production, more concern for possible infectious process.  Chest x-ray ordered, will need admission for her significant increased work of breathing and wheezing.  No hypotension or signs of severe sepsis.     Duffy Bruce, MD 07/05/17 1728
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
## 2017-07-05 NOTE — ED Notes (Signed)
Assisted pt to the BR with the [facility].  Pt's sats decreased to 87%-88% RA with exertion.  Sats is between 90%-93% on RA at rest.

## 2017-07-06 DIAGNOSIS — J101 Influenza due to other identified influenza virus with other respiratory manifestations: Secondary | ICD-10-CM | POA: Diagnosis not present

## 2017-07-06 LAB — BASIC METABOLIC PANEL
Anion gap: 9 (ref 5–15)
BUN: 29 mg/dL — ABNORMAL HIGH (ref 6–20)
CO2: 26 mmol/L (ref 22–32)
Calcium: 9 mg/dL (ref 8.9–10.3)
Chloride: 97 mmol/L — ABNORMAL LOW (ref 101–111)
Creatinine, Ser: 1.98 mg/dL — ABNORMAL HIGH (ref 0.44–1.00)
GFR calc Af Amer: 28 mL/min — ABNORMAL LOW (ref >60)
GFR calc non Af Amer: 24 mL/min — ABNORMAL LOW (ref >60)
Glucose, Bld: 240 mg/dL — ABNORMAL HIGH (ref 65–99)
Potassium: 4.8 mmol/L (ref 3.5–5.1)
Sodium: 132 mmol/L — ABNORMAL LOW (ref 135–145)

## 2017-07-06 LAB — GLUCOSE, CAPILLARY
Glucose-Capillary: 178 mg/dL — ABNORMAL HIGH (ref 65–99)
Glucose-Capillary: 194 mg/dL — ABNORMAL HIGH (ref 65–99)

## 2017-07-06 MED ORDER — INSULIN ASPART 100 UNIT/ML ~~LOC~~ SOLN
0.0000 [IU] | Freq: Three times a day (TID) | SUBCUTANEOUS | Status: DC
  Administered 2017-07-06: 3 [IU] via SUBCUTANEOUS
  Administered 2017-07-07: 2 [IU] via SUBCUTANEOUS
  Administered 2017-07-07: 5 [IU] via SUBCUTANEOUS
  Administered 2017-07-07: 2 [IU] via SUBCUTANEOUS
  Administered 2017-07-08: 3 [IU] via SUBCUTANEOUS

## 2017-07-06 MED ORDER — UMECLIDINIUM BROMIDE 62.5 MCG/INH IN AEPB
1.0000 | INHALATION_SPRAY | Freq: Every day | RESPIRATORY_TRACT | Status: DC
Start: 2017-07-06 — End: 2017-07-07
  Filled 2017-07-06: qty 7

## 2017-07-06 MED ORDER — SODIUM CHLORIDE 0.9 % IV SOLN
INTRAVENOUS | Status: DC
Start: 2017-07-06 — End: 2017-07-07
  Administered 2017-07-06: 22:00:00 via INTRAVENOUS

## 2017-07-06 MED ORDER — OLOPATADINE HCL 0.1 % OP SOLN
1.0000 [drp] | Freq: Two times a day (BID) | OPHTHALMIC | Status: DC
  Administered 2017-07-06 – 2017-07-08 (×4): 1 [drp] via OPHTHALMIC
  Filled 2017-07-06: qty 5

## 2017-07-06 MED ORDER — GUAIFENESIN-DM 100-10 MG/5ML PO SYRP
5.0000 mL | ORAL_SOLUTION | ORAL | Status: DC | PRN
  Administered 2017-07-06: 5 mL via ORAL
  Filled 2017-07-06: qty 5

## 2017-07-06 MED ORDER — HEPARIN SODIUM (PORCINE) 5000 UNIT/ML IJ SOLN
5000.0000 [IU] | Freq: Three times a day (TID) | INTRAMUSCULAR | Status: DC
  Administered 2017-07-06 – 2017-07-08 (×5): 5000 [IU] via SUBCUTANEOUS
  Filled 2017-07-06 (×5): qty 1

## 2017-07-06 MED ORDER — FLUTICASONE FUROATE-VILANTEROL 100-25 MCG/INH IN AEPB
1.0000 | INHALATION_SPRAY | Freq: Every day | RESPIRATORY_TRACT | Status: DC
Start: 2017-07-06 — End: 2017-07-07
  Filled 2017-07-06: qty 28

## 2017-07-06 NOTE — Progress Notes (Signed)
Patient is admitted to 1W04 from ED. Admission VS is stable

## 2017-07-06 NOTE — ED Notes (Signed)
Called pharmacy for release of meds

## 2017-07-06 NOTE — ED Notes (Signed)
Called to give report on 1W, RN unable to get report at this time and will call writer when ready.

## 2017-07-06 NOTE — Progress Notes (Signed)
Pt gave permission for her sister to be in room at time questions were asked on nursing admission hx. Lucius Conn BSN, RN-BC Admissions RN 07/06/2017 4:54 PM

## 2017-07-06 NOTE — Progress Notes (Signed)
PROGRESS NOTE    Amanda Cain  GEX:[id number] DOB: 17-Aug-1945 DOA: 07/05/2017 PCP: Mayra Neer, MD   Brief Narrative: Patient is a 72 year old female with past medical history of hypertension, hyperlipidemia, diabetes, CKD who presents to the emergency department with complaints of shortness of breath since last 6 days.  She was seen by her PCP about 2 days ago and was started on Bactrim.  Did not help her and she was sent to the emergency department by her PCP. Patient was found to be positive for influenza on presentation.  Assessment & Plan:   Principal Problem:   Influenza A Active Problems:   Type 2 diabetes mellitus with complication, without long-term current use of insulin (HCC)   Essential hypertension   CKD (chronic kidney disease), stage III (HCC)   Acute respiratory failure with hypoxia (HCC)  Influenza A: Continue Tamiflu.  We will continue to monitor her respiratory status.  Currently feels much better.  Tylenol as needed for fever  Hypoxia: Continue supplemental oxygen as needed.  Continue nebulization treatment.  Diabetes type 2: Continue sliding scale insulin and Tradjenta  CKD stage III: Slight increase in creatinine from the baseline.  Likely from dehydration.  Will start on gentle hydration.  We will continue to monitor her kidney function  Hypertension: Currently blood pressure stable.  We will continue to monitor it.  History of CHF: On Lasix at home which is on hold.  Currently CHF is compensated.   DVT prophylaxis: Hep [place] Code Status: Full Family Communication: None present at the bedside Disposition Plan: Home in 1-2 days   Consultants: None  Procedures: None  Antimicrobials: Tamiflu since 07/05/17  Subjective: Patient seen and examined at bedside this morning in the emergency department.  She is already feeling better.  Her respiratory status has been improving.Currently on supplemental oxygen.  Continues to cough  Objective: Vitals:   07/06/17 1200 07/06/17 1300 07/06/17 1322 07/06/17 1357  BP: (!) 175/71 (!) 178/78  (!) 177/85  Pulse: 75 68 78 74  Resp: (!) 25 (!) 25  (!) 22  Temp:   98.9 F (37.2 C) 98 F (36.7 C)  TempSrc:   Oral Oral  SpO2: 96% 97%  95%  Weight:      Height:       No intake or output data in the 24 hours ending 07/06/17 1528 Filed Weights   07/05/17 1555  Weight: 103 kg (227 lb)    Examination:  General exam: Appears calm and comfortable ,Not in obvious distress,average built Respiratory system: Bilateral decreased air entry, bilateral expiratory wheezes  cardiovascular system: S1 & S2 heard, RRR. No JVD, murmurs, rubs, gallops or clicks. No pedal edema. Gastrointestinal system: Abdomen is nondistended, soft and nontender. No organomegaly or masses felt. Normal bowel sounds heard. Central nervous system: Alert and oriented. No focal neurological deficits. Extremities: Trace edema, no clubbing ,no cyanosis, distal peripheral pulses palpable. Skin: No rashes, lesions or ulcers,no icterus ,no pallor Psychiatry: Judgement and insight appear normal. Mood & affect appropriate.     Data Reviewed: I have personally reviewed following labs and imaging studies  CBC: Recent Labs  Lab 07/05/17 1605  WBC 6.2  NEUTROABS 4.2  HGB 10.1*  HCT 31.3*  MCV 83.7  PLT 010   Basic Metabolic Panel: Recent Labs  Lab 07/05/17 1605 07/06/17 0506  NA 133* 132*  K 3.5 4.8  CL 96* 97*  CO2 27 26  GLUCOSE 121* 240*  BUN 24* 29*  CREATININE 1.95* 1.98*  CALCIUM 8.6* 9.0   GFR: Estimated Creatinine Clearance: 26.2 mL/min (A) (by C-G formula based on SCr of 1.98 mg/dL (H)). Liver Function Tests: No results for input(s): AST, ALT, ALKPHOS, BILITOT, PROT, ALBUMIN in the last 168 hours. No results for input(s): LIPASE, AMYLASE in the last 168 hours. No results for input(s): AMMONIA in the last 168 hours. Coagulation Profile: No results for input(s): INR, PROTIME in the last 168 hours. Cardiac  Enzymes: No results for input(s): CKTOTAL, CKMB, CKMBINDEX, TROPONINI in the last 168 hours. BNP (last 3 results) Recent Labs    01/31/17 0933  PROBNP 11   HbA1C: No results for input(s): HGBA1C in the last 72 hours. CBG: Recent Labs  Lab 07/05/17 2225  GLUCAP 299*   Lipid Profile: No results for input(s): CHOL, HDL, LDLCALC, TRIG, CHOLHDL, LDLDIRECT in the last 72 hours. Thyroid Function Tests: No results for input(s): TSH, T4TOTAL, FREET4, T3FREE, THYROIDAB in the last 72 hours. Anemia Panel: No results for input(s): VITAMINB12, FOLATE, FERRITIN, TIBC, IRON, RETICCTPCT in the last 72 hours. Sepsis Labs: No results for input(s): PROCALCITON, LATICACIDVEN in the last 168 hours.  No results found for this or any previous visit (from the past 240 hour(s)).       Radiology Studies: Dg Chest 2 View  Result Date: 07/05/2017 CLINICAL DATA:  Shortness of breath, history chronic bronchitis, CHF, chronic kidney disease, GERD, type II diabetes mellitus, hypertension EXAM: CHEST  2 VIEW COMPARISON:  11/17/2016 FINDINGS: Enlargement of cardiac silhouette. Mediastinal contours and pulmonary vascularity normal. Lungs clear. No acute infiltrate, pleural effusion or pneumothorax. Bones unremarkable. IMPRESSION: No acute abnormalities. Mild enlargement of cardiac silhouette. Electronically Signed   By: Lavonia Dana M.D.   On: 07/05/2017 17:42        Scheduled Meds: . atorvastatin  80 mg Oral Daily  . cholecalciferol  1,000 Units Oral Daily  . diltiazem  360 mg Oral Daily  . [START ON 07/07/2017] enoxaparin (LOVENOX) injection  30 mg Subcutaneous Q24H  . escitalopram  20 mg Oral QHS  . estradiol  0.5 mg Oral q morning - 10a  . ezetimibe  10 mg Oral Daily  . ferrous sulfate  325 mg Oral BID WC  . fluticasone  1 spray Each Nare Daily  . umeclidinium bromide  1 puff Inhalation Daily   And  . fluticasone furoate-vilanterol  1 puff Inhalation Daily  . insulin aspart  0-15 Units  Subcutaneous TID WC  . levothyroxine  250 mcg Oral QAC breakfast  . linagliptin  5 mg Oral Daily  . LORazepam  0.5 mg Oral BID  . metoprolol succinate  100 mg Oral q morning - 10a  . montelukast  10 mg Oral QHS  . multivitamin with minerals  1 tablet Oral Daily  . olopatadine  1 drop Both Eyes BID  . oseltamivir  30 mg Oral BID  . paliperidone  3 mg Oral QHS  . pantoprazole  40 mg Oral Daily  . potassium chloride SA  20 mEq Oral Daily  . saccharomyces boulardii  250 mg Oral BID  . sucralfate  1 g Oral BID   Continuous Infusions: . sodium chloride       LOS: 0 days    Time spent:     Marene Lenz, MD [facility] Pager [phone]  If 7PM-7AM, please contact night-coverage [email or web address] Password [facility] 07/06/2017, 3:28 PM

## 2017-07-06 NOTE — ED Notes (Signed)
Ambulated Pt using Steady to restroom, with Pulse ox, Pt's O2 stats dropped from 99 to 96 during process. When got back to room Ambulated Pt from room door to bed and Pt's O2 dropped from 96 to 92. After Pt settled back in bed Pt's O2 returned to 98.

## 2017-07-06 NOTE — ED Notes (Signed)
Pt is alert and oriented x 4 and is verbally responsive. Pt denies pain at this this time of assessment.

## 2017-07-06 NOTE — ED Notes (Signed)
ED TO INPATIENT HANDOFF REPORT  Name/Age/Gender Amanda Cain 72 y.o. female  Code Status    Code Status Orders  (From admission, onward)        Start     Ordered   07/05/17 2108  Full code  Continuous     07/05/17 2108    Code Status History    Date Active Date Inactive Code Status Order ID Comments User Context   11/17/2016 06:56 11/20/2016 15:44 Full Code [id number]  Edwin Dada, MD Inpatient   03/02/2015 12:47 03/03/2015 14:34 Full Code [id number]  Ralene Ok, MD Inpatient   10/31/2012 12:37 11/02/2012 19:06 Full Code [id number]  Penelope Coop Inpatient   11/19/2011 20:57 11/30/2011 16:50 Full Code [id number]  Parke Simmers, RN ED   07/15/2011 13:22 07/19/2011 14:29 Full Code [id number]  Hubert Azure, RN Inpatient      Home/SNF/Other Home  Chief Complaint sob  Level of Care/Admitting Diagnosis ED Disposition    ED Disposition Condition [place]: [facility] [[id number]]  Level of Care: Med-Surg [16]  Diagnosis: Influenza A [536144]  Admitting Physician: Etta Quill [[id number]]  Attending Physician: Etta Quill [[id number]]  PT Class (Do Not Modify): Observation [104]  PT Acc Code (Do Not Modify): Observation [10022]       Medical History Past Medical History:  Diagnosis Date  . Anemia   . Anxiety    severe  . Arthritis   . Bronchitis    hx of  . CHF (congestive heart failure) ([place])   . Chronic kidney disease    "kidney disease stage 3"  . Depression   . Diabetes mellitus   . GERD (gastroesophageal reflux disease)   . Hx of gallstones   . Hypercholesteremia   . Hypertension   . Hypothyroidism     Allergies Allergies  Allergen Reactions  . Aspirin     " Have an ulcer"    IV Location/Drains/Wounds Patient Lines/Drains/Airways Status   Active Line/Drains/Airways    Name:   Placement date:   Placement time:   Site:   Days:   Peripheral IV Right Antecubital   -    -    Antecubital       Incision (Closed) 07/21/16 Neck Right   07/21/16    0802     350   Incision - 1 Port Abdomen 1: Right;Superior;Upper;Umbilicus   00/86/76    1950     857   Incision - 3 Ports Abdomen 1: Umbilicus 2: Anterior;Mid;Umbilicus 3: Right;Anterior;Upper;Umbilicus   93/26/71    2458     857          Labs/Imaging Results for orders placed or performed during the hospital encounter of 07/05/17 (from the past 48 hour(s))  CBC with Differential     Status: Abnormal   Collection Time: 07/05/17  4:05 PM  Result Value Ref Range   WBC 6.2 4.0 - 10.5 K/uL   RBC 3.74 (L) 3.87 - 5.11 MIL/uL   Hemoglobin 10.1 (L) 12.0 - 15.0 g/dL   HCT 31.3 (L) 36.0 - 46.0 %   MCV 83.7 78.0 - 100.0 fL   MCH 27.0 26.0 - 34.0 pg   MCHC 32.3 30.0 - 36.0 g/dL   RDW 16.5 (H) 11.5 - 15.5 %   Platelets 246 150 - 400 K/uL   Neutrophils Relative % 67 %   Neutro Abs 4.2 1.7 - 7.7 K/uL   Lymphocytes Relative 17 %  Lymphs Abs 1.1 0.7 - 4.0 K/uL   Monocytes Relative 15 %   Monocytes Absolute 0.9 0.1 - 1.0 K/uL   Eosinophils Relative 1 %   Eosinophils Absolute 0.1 0.0 - 0.7 K/uL   Basophils Relative 0 %   Basophils Absolute 0.0 0.0 - 0.1 K/uL    Comment: Performed at [facility], [place] [address] [place], [place] [address]  Basic metabolic panel     Status: Abnormal   Collection Time: 07/05/17  4:05 PM  Result Value Ref Range   Sodium 133 (L) 135 - 145 mmol/L   Potassium 3.5 3.5 - 5.1 mmol/L   Chloride 96 (L) 101 - 111 mmol/L   CO2 27 22 - 32 mmol/L   Glucose, Bld 121 (H) 65 - 99 mg/dL   BUN 24 (H) 6 - 20 mg/dL   Creatinine, Ser 1.95 (H) 0.44 - 1.00 mg/dL   Calcium 8.6 (L) 8.9 - 10.3 mg/dL   GFR calc non Af Amer 24 (L) >60 mL/min   GFR calc Af Amer 28 (L) >60 mL/min    Comment: (NOTE) The eGFR has been calculated using the CKD EPI equation. This calculation has not been validated in all clinical situations. eGFR's persistently <60 mL/min signify possible Chronic Kidney Disease.    Anion gap 10  5 - 15    Comment: Performed at [facility], [place] [address] [place], [place] [address]  Brain natriuretic peptide     Status: None   Collection Time: 07/05/17  4:36 PM  Result Value Ref Range   B Natriuretic Peptide 81.5 0.0 - 100.0 pg/mL    Comment: Performed at [facility], [place] [address] [place], [place] [address]  I-stat troponin, ED     Status: None   Collection Time: 07/05/17  4:48 PM  Result Value Ref Range   Troponin i, poc 0.02 0.00 - 0.08 ng/mL   Comment 3            Comment: Due to the release kinetics of cTnI, a negative result within the first hours of the onset of symptoms does not rule out myocardial infarction with certainty. If myocardial infarction is still suspected, repeat the test at appropriate intervals.   Influenza panel by PCR (type A & B)     Status: Abnormal   Collection Time: 07/05/17  5:47 PM  Result Value Ref Range   Influenza A By PCR POSITIVE (A) NEGATIVE   Influenza B By PCR NEGATIVE NEGATIVE    Comment: (NOTE) The Xpert Xpress Flu assay is intended as an aid in the diagnosis of  influenza and should not be used as a sole basis for treatment.  This  assay is FDA approved for nasopharyngeal swab specimens only. Nasal  washings and aspirates are unacceptable for Xpert Xpress Flu testing. Performed at [facility], [place] [address] [place], [place] [address]   CBG monitoring, ED     Status: Abnormal   Collection Time: 07/05/17 10:25 PM  Result Value Ref Range   Glucose-Capillary 299 (H) 65 - 99 mg/dL  Basic metabolic panel     Status: Abnormal   Collection Time: 07/06/17  5:06 AM  Result Value Ref Range   Sodium 132 (L) 135 - 145 mmol/L   Potassium 4.8 3.5 - 5.1 mmol/L    Comment: DELTA CHECK NOTED REPEATED TO VERIFY NO VISIBLE HEMOLYSIS    Chloride 97 (L) 101 - 111 mmol/L   CO2 26 22 -  32 mmol/L   Glucose, Bld 240 (H) 65 - 99 mg/dL   BUN 29 (H) 6 - 20 mg/dL   Creatinine, Ser  1.98 (H) 0.44 - 1.00 mg/dL   Calcium 9.0 8.9 - 10.3 mg/dL   GFR calc non Af Amer 24 (L) >60 mL/min   GFR calc Af Amer 28 (L) >60 mL/min    Comment: (NOTE) The eGFR has been calculated using the CKD EPI equation. This calculation has not been validated in all clinical situations. eGFR's persistently <60 mL/min signify possible Chronic Kidney Disease.    Anion gap 9 5 - 15    Comment: Performed at [facility], [place] [address] [place], [place] [address]   Dg Chest 2 View  Result Date: 07/05/2017 CLINICAL DATA:  Shortness of breath, history chronic bronchitis, CHF, chronic kidney disease, GERD, type II diabetes mellitus, hypertension EXAM: CHEST  2 VIEW COMPARISON:  11/17/2016 FINDINGS: Enlargement of cardiac silhouette. Mediastinal contours and pulmonary vascularity normal. Lungs clear. No acute infiltrate, pleural effusion or pneumothorax. Bones unremarkable. IMPRESSION: No acute abnormalities. Mild enlargement of cardiac silhouette. Electronically Signed   By: Lavonia Dana M.D.   On: 07/05/2017 17:42    Pending Labs Unresulted Labs (From admission, onward)   None      Vitals/Pain Today's Vitals   07/06/17 1000 07/06/17 1100 07/06/17 1130 07/06/17 1137  BP: (!) 169/136 (!) 192/92 (!) 182/73   Pulse: 75 75 72   Resp: (!) 30 (!) 26 20   Temp:      TempSrc:      SpO2: 95% 96% 98%   Weight:      Height:      PainSc:    0-No pain    Isolation Precautions Droplet precaution  Medications Medications  oseltamivir (TAMIFLU) capsule 30 mg (30 mg Oral Given 07/06/17 1024)  acetaminophen (TYLENOL) tablet 650 mg (not administered)    Or  acetaminophen (TYLENOL) suppository 650 mg (not administered)  ondansetron (ZOFRAN) tablet 4 mg (not administered)    Or  ondansetron (ZOFRAN) injection 4 mg (not administered)  enoxaparin (LOVENOX) injection 30 mg (not administered)  albuterol (PROVENTIL) (2.5 MG/3ML) 0.083% nebulizer solution 2.5 mg (not administered)   cholecalciferol (VITAMIN D) tablet 1,000 Units (1,000 Units Oral Not Given 07/06/17 0002)  diltiazem (CARDIZEM CD) 24 hr capsule 360 mg (not administered)  atorvastatin (LIPITOR) tablet 80 mg (not administered)  escitalopram (LEXAPRO) tablet 20 mg (20 mg Oral Given 07/05/17 2358)  estradiol (ESTRACE) tablet 0.5 mg (not administered)  ezetimibe (ZETIA) tablet 10 mg (not administered)  ferrous sulfate tablet 325 mg (325 mg Oral Given 07/06/17 0851)  fluticasone (FLONASE) 50 MCG/ACT nasal spray 1 spray (not administered)  levothyroxine (SYNTHROID, LEVOTHROID) tablet 250 mcg (250 mcg Oral Given 07/06/17 0851)  LORazepam (ATIVAN) tablet 0.5 mg (0.5 mg Oral Given 07/06/17 1024)  metoprolol succinate (TOPROL-XL) 24 hr tablet 100 mg (not administered)  montelukast (SINGULAIR) tablet 10 mg (10 mg Oral Given 07/05/17 2357)  paliperidone (INVEGA) 24 hr tablet 3 mg (3 mg Oral Given 07/05/17 2358)  multivitamin with minerals tablet 1 tablet (1 tablet Oral Not Given 07/06/17 0002)  pantoprazole (PROTONIX) EC tablet 40 mg (not administered)  sucralfate (CARAFATE) tablet 1 g (1 g Oral Given 07/05/17 2357)  saccharomyces boulardii (FLORASTOR) capsule 250 mg (not administered)  polyvinyl alcohol (LIQUIFILM TEARS) 1.4 % ophthalmic solution 1 drop (not administered)  potassium chloride SA (K-DUR,KLOR-CON) CR tablet 20 mEq (20 mEq Oral Given 07/06/17 1024)  linagliptin (TRADJENTA) tablet 5  mg (not administered)  olopatadine (PATANOL) 0.1 % ophthalmic solution 1 drop (not administered)  umeclidinium bromide (INCRUSE ELLIPTA) 62.5 MCG/INH 1 puff (not administered)    And  fluticasone furoate-vilanterol (BREO ELLIPTA) 100-25 MCG/INH 1 puff (not administered)  albuterol (PROVENTIL) (2.5 MG/3ML) 0.083% nebulizer solution 5 mg (5 mg Nebulization Given 07/05/17 1626)  methylPREDNISolone sodium succinate (SOLU-MEDROL) 125 mg/2 mL injection 125 mg (125 mg Intravenous Given 07/05/17 1627)  oseltamivir (TAMIFLU) capsule 75 mg (75 mg Oral Given  07/05/17 2220)    Mobility walks with device

## 2017-07-06 NOTE — ED Notes (Signed)
Spoke with Olivia Mackie RN who stated pt ambulation status is in question as she has been using a Steady d/t SOB exacerbation with ambulation, and AC is determining if pt would be appropriate for unit.

## 2017-07-06 NOTE — Progress Notes (Signed)
Inpatient Diabetes Program Recommendations  AACE/ADA: New Consensus Statement on Inpatient Glycemic Control (2015)  Target Ranges:  Prepandial:   less than 140 mg/dL      Peak postprandial:   less than 180 mg/dL (1-2 hours)      Critically ill patients:  140 - 180 mg/dL   Lab Results  Component Value Date   GLUCAP 299 (H) 07/05/2017   HGBA1C 6.3 (H) 11/17/2016    Review of Glycemic Control  Diabetes history: DM 2 Outpatient Diabetes medications: Januvia 50 mg Daily Current orders for Inpatient glycemic control: Tradjenta 5 mg Daily  Inpatient Diabetes Program Recommendations:     Consider CBGs and Novolog Sensitive Correction 0-9 units tid while here.  Thanks,  Tama Headings RN, MSN, [facility] Inpatient Diabetes Coordinator Team Pager [phone] (8a-5p)

## 2017-07-07 ENCOUNTER — Ambulatory Visit: Payer: Medicare Other | Admitting: Podiatry

## 2017-07-07 DIAGNOSIS — I130 Hypertensive heart and chronic kidney disease with heart failure and stage 1 through stage 4 chronic kidney disease, or unspecified chronic kidney disease: Secondary | ICD-10-CM | POA: Diagnosis present

## 2017-07-07 DIAGNOSIS — Z794 Long term (current) use of insulin: Secondary | ICD-10-CM | POA: Diagnosis not present

## 2017-07-07 DIAGNOSIS — F419 Anxiety disorder, unspecified: Secondary | ICD-10-CM | POA: Diagnosis present

## 2017-07-07 DIAGNOSIS — F329 Major depressive disorder, single episode, unspecified: Secondary | ICD-10-CM | POA: Diagnosis present

## 2017-07-07 DIAGNOSIS — N183 Chronic kidney disease, stage 3 (moderate): Secondary | ICD-10-CM | POA: Diagnosis present

## 2017-07-07 DIAGNOSIS — I509 Heart failure, unspecified: Secondary | ICD-10-CM | POA: Diagnosis present

## 2017-07-07 DIAGNOSIS — E7800 Pure hypercholesterolemia, unspecified: Secondary | ICD-10-CM | POA: Diagnosis present

## 2017-07-07 DIAGNOSIS — E039 Hypothyroidism, unspecified: Secondary | ICD-10-CM | POA: Diagnosis present

## 2017-07-07 DIAGNOSIS — R0602 Shortness of breath: Secondary | ICD-10-CM | POA: Diagnosis present

## 2017-07-07 DIAGNOSIS — Z96651 Presence of right artificial knee joint: Secondary | ICD-10-CM | POA: Diagnosis present

## 2017-07-07 DIAGNOSIS — K219 Gastro-esophageal reflux disease without esophagitis: Secondary | ICD-10-CM | POA: Diagnosis present

## 2017-07-07 DIAGNOSIS — Z9049 Acquired absence of other specified parts of digestive tract: Secondary | ICD-10-CM | POA: Diagnosis not present

## 2017-07-07 DIAGNOSIS — E1122 Type 2 diabetes mellitus with diabetic chronic kidney disease: Secondary | ICD-10-CM | POA: Diagnosis present

## 2017-07-07 DIAGNOSIS — E860 Dehydration: Secondary | ICD-10-CM | POA: Diagnosis present

## 2017-07-07 DIAGNOSIS — Z79899 Other long term (current) drug therapy: Secondary | ICD-10-CM | POA: Diagnosis not present

## 2017-07-07 DIAGNOSIS — Z90710 Acquired absence of both cervix and uterus: Secondary | ICD-10-CM | POA: Diagnosis not present

## 2017-07-07 DIAGNOSIS — J9601 Acute respiratory failure with hypoxia: Secondary | ICD-10-CM | POA: Diagnosis present

## 2017-07-07 DIAGNOSIS — J101 Influenza due to other identified influenza virus with other respiratory manifestations: Secondary | ICD-10-CM | POA: Diagnosis present

## 2017-07-07 DIAGNOSIS — J111 Influenza due to unidentified influenza virus with other respiratory manifestations: Secondary | ICD-10-CM | POA: Diagnosis present

## 2017-07-07 LAB — BASIC METABOLIC PANEL
Anion gap: 8 (ref 5–15)
BUN: 39 mg/dL — ABNORMAL HIGH (ref 6–20)
CO2: 25 mmol/L (ref 22–32)
Calcium: 9 mg/dL (ref 8.9–10.3)
Chloride: 101 mmol/L (ref 101–111)
Creatinine, Ser: 2.1 mg/dL — ABNORMAL HIGH (ref 0.44–1.00)
GFR calc Af Amer: 26 mL/min — ABNORMAL LOW (ref >60)
GFR calc non Af Amer: 22 mL/min — ABNORMAL LOW (ref >60)
Glucose, Bld: 161 mg/dL — ABNORMAL HIGH (ref 65–99)
Potassium: 4.8 mmol/L (ref 3.5–5.1)
Sodium: 134 mmol/L — ABNORMAL LOW (ref 135–145)

## 2017-07-07 LAB — GLUCOSE, CAPILLARY
Glucose-Capillary: 136 mg/dL — ABNORMAL HIGH (ref 65–99)
Glucose-Capillary: 140 mg/dL — ABNORMAL HIGH (ref 65–99)
Glucose-Capillary: 226 mg/dL — ABNORMAL HIGH (ref 65–99)
Glucose-Capillary: 161 mg/dL — ABNORMAL HIGH (ref 65–99)

## 2017-07-07 MED ORDER — METHYLPREDNISOLONE SODIUM SUCC 40 MG IJ SOLR
40.0000 mg | Freq: Every day | INTRAMUSCULAR | Status: DC
  Administered 2017-07-07 – 2017-07-08 (×2): 40 mg via INTRAVENOUS
  Filled 2017-07-07 (×2): qty 1

## 2017-07-07 MED ORDER — OSELTAMIVIR PHOSPHATE 30 MG PO CAPS
30.0000 mg | ORAL_CAPSULE | Freq: Every day | ORAL | Status: DC
  Administered 2017-07-07 – 2017-07-08 (×2): 30 mg via ORAL
  Filled 2017-07-07 (×2): qty 1

## 2017-07-07 MED ORDER — GUAIFENESIN-DM 100-10 MG/5ML PO SYRP
10.0000 mL | ORAL_SOLUTION | ORAL | Status: DC | PRN
  Administered 2017-07-07 (×2): 10 mL via ORAL
  Filled 2017-07-07 (×2): qty 10

## 2017-07-07 MED ORDER — HYDRALAZINE HCL 20 MG/ML IJ SOLN
5.0000 mg | Freq: Once | INTRAMUSCULAR | Status: AC
  Administered 2017-07-07: 5 mg via INTRAVENOUS
  Filled 2017-07-07: qty 1

## 2017-07-07 MED ORDER — IPRATROPIUM-ALBUTEROL 0.5-2.5 (3) MG/3ML IN SOLN
3.0000 mL | Freq: Four times a day (QID) | RESPIRATORY_TRACT | Status: DC
  Administered 2017-07-07 – 2017-07-08 (×3): 3 mL via RESPIRATORY_TRACT
  Filled 2017-07-07 (×4): qty 3

## 2017-07-07 NOTE — Progress Notes (Addendum)
PROGRESS NOTE    Amanda Cain  DTO:[id number] DOB: [birth year] DOA: 07/05/2017 PCP: Mayra Neer, MD   Brief Narrative: Patient is a 72 year old female with past medical history of hypertension, hyperlipidemia, diabetes, CKD who presents to the emergency department with complaints of shortness of breath since last 6 days.  She was seen by her PCP about 2 days ago and was started on Bactrim.  Did not help her and she was sent to the emergency department by her PCP. Patient was found to be positive for influenza on presentation. Patient says she has H/O bronchitis but never smoked. She is on inhalers at home.  Assessment & Plan:   Principal Problem:   Influenza A Active Problems:   Type 2 diabetes mellitus with complication, without long-term current use of insulin (HCC)   Essential hypertension   CKD (chronic kidney disease), stage III (HCC)   Acute respiratory failure with hypoxia (HCC)  Influenza A: Continue Tamiflu.  We will continue to monitor her respiratory status. She is still coughing.  Tylenol as needed for fever.She is afebrile currently. Also started on duoneb and solumedrol.  Hypoxia: Continue supplemental oxygen as needed.  Continue nebulization treatment.  Diabetes type 2: Continue sliding scale insulin and Tradjenta  CKD stage III: Slight increase in creatinine from the baseline. Baseline around 1.5.We will continue to monitor her kidney function. Stopped fluids today as kidney function got worse with IV fluids.  Hypertension: Little hypertensive this morning.  We will continue to monitor it.Continue current medications.  History of CHF: On Lasix at home which is on hold.  Currently CHF is compensated.No edema.Folows with cardiology as an outpatient.   DVT prophylaxis: Hep [place] Code Status: Full Family Communication: None present at the bedside Disposition Plan: Home in 1-2 days   Consultants: None  Procedures: None  Antimicrobials: Tamiflu since  07/05/17  Subjective: Patient seen and examined at bedside this morning.She is still coughing . She is in mild respiratory distress. Ausculatated to have bilateral expiratpry wheezes.Started on steroids.  Objective: Vitals:   07/06/17 1322 07/06/17 1357 07/06/17 2035 07/07/17 0420  BP:  (!) 177/85 (!) 171/72 (!) 161/72  Pulse: 78 74 70 60  Resp:  (!) 22 (!) 24 (!) 24  Temp: 98.9 F (37.2 C) 98 F (36.7 C) 98.3 F (36.8 C) 98.3 F (36.8 C)  TempSrc: Oral Oral Oral Oral  SpO2:  95% 95% 95%  Weight:      Height:        Intake/Output Summary (Last 24 hours) at 07/07/2017 0810 Last data filed at 07/07/2017 0720 Gross per 24 hour  Intake 1161.25 ml  Output -  Net 1161.25 ml   Filed Weights   07/05/17 1555  Weight: 103 kg (227 lb)    Examination:  General exam: Appears calm and comfortable ,in mild respiratory distress,average built Respiratory system: Bilateral decreased air entry. Bilateral expiratory wheezes. Cardiovascular system: S1 & S2 heard, RRR. No JVD, murmurs, rubs, gallops or clicks.  Gastrointestinal system: Abdomen is nondistended, soft and nontender. No organomegaly or masses felt. Normal bowel sounds heard. Central nervous system: Alert and oriented. No focal neurological deficits. Extremities: No edema, no clubbing ,no cyanosis, distal peripheral pulses palpable. Skin: No cyanosis,No pallor,No Rash,No Ulcer Psychiatry: Judgement and insight appear normal. Mood & affect appropriate.  GU: No Foley     Data Reviewed: I have personally reviewed following labs and imaging studies  CBC: Recent Labs  Lab 07/05/17 1605  WBC 6.2  NEUTROABS 4.2  HGB  10.1*  HCT 31.3*  MCV 83.7  PLT 656   Basic Metabolic Panel: Recent Labs  Lab 07/05/17 1605 07/06/17 0506 07/07/17 0405  NA 133* 132* 134*  K 3.5 4.8 4.8  CL 96* 97* 101  CO2 27 26 25   GLUCOSE 121* 240* 161*  BUN 24* 29* 39*  CREATININE 1.95* 1.98* 2.10*  CALCIUM 8.6* 9.0 9.0   GFR: Estimated  Creatinine Clearance: 24.7 mL/min (A) (by C-G formula based on SCr of 2.1 mg/dL (H)). Liver Function Tests: No results for input(s): AST, ALT, ALKPHOS, BILITOT, PROT, ALBUMIN in the last 168 hours. No results for input(s): LIPASE, AMYLASE in the last 168 hours. No results for input(s): AMMONIA in the last 168 hours. Coagulation Profile: No results for input(s): INR, PROTIME in the last 168 hours. Cardiac Enzymes: No results for input(s): CKTOTAL, CKMB, CKMBINDEX, TROPONINI in the last 168 hours. BNP (last 3 results) Recent Labs    01/31/17 0933  PROBNP 11   HbA1C: No results for input(s): HGBA1C in the last 72 hours. CBG: Recent Labs  Lab 07/05/17 2225 07/06/17 1735 07/06/17 2155  GLUCAP 299* 178* 194*   Lipid Profile: No results for input(s): CHOL, HDL, LDLCALC, TRIG, CHOLHDL, LDLDIRECT in the last 72 hours. Thyroid Function Tests: No results for input(s): TSH, T4TOTAL, FREET4, T3FREE, THYROIDAB in the last 72 hours. Anemia Panel: No results for input(s): VITAMINB12, FOLATE, FERRITIN, TIBC, IRON, RETICCTPCT in the last 72 hours. Sepsis Labs: No results for input(s): PROCALCITON, LATICACIDVEN in the last 168 hours.  No results found for this or any previous visit (from the past 240 hour(s)).       Radiology Studies: Dg Chest 2 View  Result Date: 07/05/2017 CLINICAL DATA:  Shortness of breath, history chronic bronchitis, CHF, chronic kidney disease, GERD, type II diabetes mellitus, hypertension EXAM: CHEST  2 VIEW COMPARISON:  11/17/2016 FINDINGS: Enlargement of cardiac silhouette. Mediastinal contours and pulmonary vascularity normal. Lungs clear. No acute infiltrate, pleural effusion or pneumothorax. Bones unremarkable. IMPRESSION: No acute abnormalities. Mild enlargement of cardiac silhouette. Electronically Signed   By: Lavonia Dana M.D.   On: 07/05/2017 17:42        Scheduled Meds: . atorvastatin  80 mg Oral Daily  . cholecalciferol  1,000 Units Oral Daily  .  diltiazem  360 mg Oral Daily  . escitalopram  20 mg Oral QHS  . estradiol  0.5 mg Oral q morning - 10a  . ezetimibe  10 mg Oral Daily  . ferrous sulfate  325 mg Oral BID WC  . fluticasone  1 spray Each Nare Daily  . heparin injection (subcutaneous)  5,000 Units Subcutaneous Q8H  . insulin aspart  0-15 Units Subcutaneous TID WC  . ipratropium-albuterol  3 mL Nebulization Q6H  . levothyroxine  250 mcg Oral QAC breakfast  . linagliptin  5 mg Oral Daily  . LORazepam  0.5 mg Oral BID  . methylPREDNISolone (SOLU-MEDROL) injection  40 mg Intravenous Daily  . metoprolol succinate  100 mg Oral q morning - 10a  . montelukast  10 mg Oral QHS  . multivitamin with minerals  1 tablet Oral Daily  . olopatadine  1 drop Both Eyes BID  . oseltamivir  30 mg Oral Daily  . paliperidone  3 mg Oral QHS  . pantoprazole  40 mg Oral Daily  . potassium chloride SA  20 mEq Oral Daily  . saccharomyces boulardii  250 mg Oral BID  . sucralfate  1 g Oral BID   Continuous Infusions:  LOS: 0 days    Time spent: 25 mins    Titiana Severa Jodie Echevaria, MD [facility] Pager [phone]  If 7PM-7AM, please contact night-coverage [email or web address] Password [email or web address] 07/07/2017, 8:10 AM

## 2017-07-07 NOTE — Plan of Care (Signed)
  Activity: Risk for activity intolerance will decrease 07/07/2017 0908 - Progressing by Dorene Sorrow, RN   Nutrition: Adequate nutrition will be maintained 07/07/2017 0908 - Progressing by Dorene Sorrow, RN   Pain Managment: General experience of comfort will improve 07/07/2017 0908 - Progressing by Dorene Sorrow, RN

## 2017-07-08 LAB — CBC WITH DIFFERENTIAL/PLATELET
Basophils Absolute: 0 10*3/uL (ref 0.0–0.1)
Basophils Relative: 0 %
Eosinophils Absolute: 0 10*3/uL (ref 0.0–0.7)
Eosinophils Relative: 0 %
HCT: 34.2 % — ABNORMAL LOW (ref 36.0–46.0)
Hemoglobin: 11 g/dL — ABNORMAL LOW (ref 12.0–15.0)
Lymphocytes Relative: 8 %
Lymphs Abs: 0.9 10*3/uL (ref 0.7–4.0)
MCH: 27 pg (ref 26.0–34.0)
MCHC: 32.2 g/dL (ref 30.0–36.0)
MCV: 83.8 fL (ref 78.0–100.0)
Monocytes Absolute: 0.6 10*3/uL (ref 0.1–1.0)
Monocytes Relative: 5 %
Neutro Abs: 9.7 10*3/uL — ABNORMAL HIGH (ref 1.7–7.7)
Neutrophils Relative %: 87 %
Platelets: 331 10*3/uL (ref 150–400)
RBC: 4.08 MIL/uL (ref 3.87–5.11)
RDW: 16.6 % — ABNORMAL HIGH (ref 11.5–15.5)
WBC: 11.3 10*3/uL — ABNORMAL HIGH (ref 4.0–10.5)

## 2017-07-08 LAB — GLUCOSE, CAPILLARY: Glucose-Capillary: 186 mg/dL — ABNORMAL HIGH (ref 65–99)

## 2017-07-08 LAB — BASIC METABOLIC PANEL
Anion gap: 8 (ref 5–15)
BUN: 40 mg/dL — ABNORMAL HIGH (ref 6–20)
CO2: 25 mmol/L (ref 22–32)
Calcium: 9 mg/dL (ref 8.9–10.3)
Chloride: 101 mmol/L (ref 101–111)
Creatinine, Ser: 1.75 mg/dL — ABNORMAL HIGH (ref 0.44–1.00)
GFR calc Af Amer: 32 mL/min — ABNORMAL LOW (ref >60)
GFR calc non Af Amer: 28 mL/min — ABNORMAL LOW (ref >60)
Glucose, Bld: 182 mg/dL — ABNORMAL HIGH (ref 65–99)
Potassium: 4.3 mmol/L (ref 3.5–5.1)
Sodium: 134 mmol/L — ABNORMAL LOW (ref 135–145)

## 2017-07-08 MED ORDER — PREDNISONE 20 MG PO TABS: 40.0000 mg | ORAL_TABLET | Freq: Every day | ORAL | 0 refills | Status: AC

## 2017-07-08 MED ORDER — OSELTAMIVIR PHOSPHATE 30 MG PO CAPS: 30.0000 mg | ORAL_CAPSULE | Freq: Every day | ORAL | 0 refills | Status: DC

## 2017-07-08 MED ORDER — GUAIFENESIN-DM 100-10 MG/5ML PO SYRP: 10.0000 mL | ORAL_SOLUTION | ORAL | 0 refills | Status: DC | PRN

## 2017-07-08 MED ORDER — PREDNISONE 20 MG PO TABS: 40.0000 mg | ORAL_TABLET | Freq: Every day | ORAL | 0 refills | Status: DC

## 2017-07-08 NOTE — Discharge Summary (Signed)
Physician Discharge Summary  Amanda Cain BVQ:[id number] DOB: November 13, 1945 DOA: 07/05/2017  PCP: Mayra Neer, MD  Admit date: 07/05/2017 Discharge date: 07/08/2017  Admitted From: Home Disposition:  Home  Discharge Condition: Stable CODE STATUS: Full Diet recommendation: Heart Healthy   Brief/Interim Summary: Patient is a 72 year old female with past medical history of hypertension, hyperlipidemia, diabetes, CKD who presents to the emergency department with complaints of shortness of breath for 6 days.  She was seen by her PCP about 2 days ago and was started on Bactrim. It was not helping her  and she was sent to the emergency department by her PCP. Patient was found to be positive for influenza on presentation. Patient says she has H/O bronchitis but never smoked. She is on inhalers at home. Patient was started on antibiotics, bronchodilators and steroids.  Her respiratory status gradually improved. Patient is a stable enough to be discharged home today.  Following problems were addressed during hospitalization:  Influenza A: Started on  Tamiflu.  She is afebrile currently. Also started on duoneb and solumedrol her.  She will continue oral prednisone for 5 more days at home.  Continue Tamiflu to complete the course.  Hypoxia: Resolved .Currently she is saturating fine on room air.  Diabetes type 2: Resume her home meds.  CKD stage III: Slight increase in creatinine from the baseline. Baseline around 1.5.We will recommend to do a BMP test in a week to check her kidney function.  Patient benefits with outpatient nephrology evaluation.  Hypertension: BP acceptable at this point.Continue current medications.  History of CHF: On Lasix at home which we will continue.  Currently CHF is compensated.No edema.Folows with cardiology as an outpatient.      Discharge Diagnoses:  Principal Problem:   Influenza A Active Problems:   Type 2 diabetes mellitus with complication,  without long-term current use of insulin (HCC)   Essential hypertension   CKD (chronic kidney disease), stage III (HCC)   Acute respiratory failure with hypoxia (HCC)   Flu    Discharge Instructions  Discharge Instructions    Diet - low sodium heart healthy   Complete by:  As directed    Discharge instructions   Complete by:  As directed    1) Take prescribed medications as instructed. 2) Follow-up with your PCP within a week. 3) Do a CBC and BMP test to check your kidney function and white cell counts when you visit your PCP. 4) Make an appointment with a nephrologist as an outpatient through the referral of your PCP.   Increase activity slowly   Complete by:  As directed      Allergies as of 07/08/2017      Reactions   Aspirin    " Have an ulcer"      Medication List    TAKE these medications   acetaminophen 500 MG tablet Commonly known as:  TYLENOL Take 500 mg by mouth every 6 (six) hours as needed for mild pain.   albuterol 108 (90 Base) MCG/ACT inhaler Commonly known as:  PROVENTIL HFA;VENTOLIN HFA Inhale 2 puffs into the lungs every 4 (four) hours as needed for wheezing or shortness of breath.   albuterol (2.5 MG/3ML) 0.083% nebulizer solution Commonly known as:  PROVENTIL Take 2.5 mg by nebulization every 4 (four) hours as needed for wheezing or shortness of breath.   atorvastatin 80 MG tablet Commonly known as:  LIPITOR Take 1 tablet by mouth daily.   Biotin 10000 MCG Tabs Take 1 tablet by  mouth daily.   cholecalciferol 1000 units tablet Commonly known as:  VITAMIN D Take 1,000 Units by mouth daily.   diltiazem 360 MG 24 hr capsule Commonly known as:  CARDIZEM CD Take 1 capsule by mouth daily.   escitalopram 20 MG tablet Commonly known as:  LEXAPRO Take 20 mg by mouth at bedtime.   estradiol 0.5 MG tablet Commonly known as:  ESTRACE Take 0.5 mg by mouth every morning.   ezetimibe 10 MG tablet Commonly known as:  ZETIA Take 10 mg by mouth  daily.   ferrous sulfate 325 (65 FE) MG tablet Take 325 mg by mouth 2 (two) times daily with a meal.   fluticasone 50 MCG/ACT nasal spray Commonly known as:  FLONASE Place 1 spray into both nostrils daily.   furosemide 40 MG tablet Commonly known as:  LASIX Take 1 tablet (40 mg total) by mouth daily.   guaiFENesin-dextromethorphan 100-10 MG/5ML syrup Commonly known as:  ROBITUSSIN DM Take 10 mLs by mouth every 4 (four) hours as needed for cough.   levothyroxine 125 MCG tablet Commonly known as:  SYNTHROID, LEVOTHROID Take 250 mcg by mouth daily before breakfast.   LORazepam 0.5 MG tablet Commonly known as:  ATIVAN Take 0.5 mg by mouth 2 (two) times daily.   metoprolol succinate 100 MG 24 hr tablet Commonly known as:  TOPROL-XL Take 100 mg by mouth every morning.   montelukast 10 MG tablet Commonly known as:  SINGULAIR take 1 tablet by mouth at bedtime   multivitamin with minerals Tabs tablet Take 1 tablet by mouth daily.   oseltamivir 30 MG capsule Commonly known as:  TAMIFLU Take 1 capsule (30 mg total) by mouth daily.   paliperidone 3 MG 24 hr tablet Commonly known as:  INVEGA Take 3 mg by mouth at bedtime.   pantoprazole 40 MG tablet Commonly known as:  PROTONIX take 1 tablet by mouth once daily   PAZEO 0.7 % Soln Generic drug:  Olopatadine HCl Place 1 drop into both eyes daily.   potassium chloride SA 20 MEQ tablet Commonly known as:  K-DUR,KLOR-CON Take 20 mEq by mouth daily. What changed:  Another medication with the same name was removed. Continue taking this medication, and follow the directions you see here.   predniSONE 20 MG tablet Commonly known as:  DELTASONE Take 2 tablets (40 mg total) by mouth daily for 5 days.   saccharomyces boulardii 250 MG capsule Commonly known as:  FLORASTOR Take 1 capsule (250 mg total) by mouth 2 (two) times daily.   sitaGLIPtin 50 MG tablet Commonly known as:  JANUVIA Take 50 mg by mouth daily.   sucralfate  1 g tablet Commonly known as:  CARAFATE Take 1 g by mouth 2 (two) times daily.   SYSTANE 0.4-0.3 % Soln Generic drug:  Polyethyl Glycol-Propyl Glycol Apply 1 drop to eye daily as needed (for dry eyes).   TRELEGY ELLIPTA 100-62.5-25 MCG/INH Aepb Generic drug:  Fluticasone-Umeclidin-Vilant Inhale 1 Inhaler into the lungs 2 (two) times daily.   vitamin B-12 1000 MCG tablet Commonly known as:  CYANOCOBALAMIN Take 1,000 mcg by mouth daily.   vitamin C 500 MG tablet Commonly known as:  ASCORBIC ACID Take 500 mg by mouth daily.      Follow-up Information    Mayra Neer, MD. Schedule an appointment as soon as possible for a visit in 1 week(s).   Specialty:  Family Medicine Contact information: [address] [place] [address] [phone]  Allergies  Allergen Reactions  . Aspirin     " Have an ulcer"    Consultations:  None   Procedures/Studies: Dg Chest 2 View  Result Date: 07/05/2017 CLINICAL DATA:  Shortness of breath, history chronic bronchitis, CHF, chronic kidney disease, GERD, type II diabetes mellitus, hypertension EXAM: CHEST  2 VIEW COMPARISON:  11/17/2016 FINDINGS: Enlargement of cardiac silhouette. Mediastinal contours and pulmonary vascularity normal. Lungs clear. No acute infiltrate, pleural effusion or pneumothorax. Bones unremarkable. IMPRESSION: No acute abnormalities. Mild enlargement of cardiac silhouette. Electronically Signed   By: Lavonia Dana M.D.   On: 07/05/2017 17:42    None   Subjective: Patient seen and examined the bedside this morning.  This morning she feels much better.  Respiratory status is stable.  Currently she is saturating fine on room air.  Patient is stable for discharge.  Discharge Exam: Vitals:   07/08/17 0146 07/08/17 0537  BP:  (!) 159/73  Pulse:  72  Resp:  20  Temp:  97.6 F (36.4 C)  SpO2: 98% 99%   Vitals:   07/07/17 1953 07/08/17 0030 07/08/17 0146 07/08/17 0537  BP: (!) 179/62  (!) 175/62  (!) 159/73  Pulse: (!) 58   72  Resp: 18   20  Temp: 97.7 F (36.5 C)   97.6 F (36.4 C)  TempSrc: Oral   Oral  SpO2: 98%  98% 99%  Weight:      Height:        General: Pt is alert, awake, not in acute distress Cardiovascular: RRR, S1/S2 +, no rubs, no gallops Respiratory: Mild bilateral decreased air entry Abdominal: Soft, NT, ND, bowel sounds + Extremities: no edema, no cyanosis    The results of significant diagnostics from this hospitalization (including imaging, microbiology, ancillary and laboratory) are listed below for reference.     Microbiology: No results found for this or any previous visit (from the past 240 hour(s)).   Labs: BNP (last 3 results) Recent Labs    11/17/16 0116 07/05/17 1636  BNP 106.3* 16.1   Basic Metabolic Panel: Recent Labs  Lab 07/05/17 1605 07/06/17 0506 07/07/17 0405 07/08/17 0511  NA 133* 132* 134* 134*  K 3.5 4.8 4.8 4.3  CL 96* 97* 101 101  CO2 27 26 25 25   GLUCOSE 121* 240* 161* 182*  BUN 24* 29* 39* 40*  CREATININE 1.95* 1.98* 2.10* 1.75*  CALCIUM 8.6* 9.0 9.0 9.0   Liver Function Tests: No results for input(s): AST, ALT, ALKPHOS, BILITOT, PROT, ALBUMIN in the last 168 hours. No results for input(s): LIPASE, AMYLASE in the last 168 hours. No results for input(s): AMMONIA in the last 168 hours. CBC: Recent Labs  Lab 07/05/17 1605 07/08/17 0511  WBC 6.2 11.3*  NEUTROABS 4.2 9.7*  HGB 10.1* 11.0*  HCT 31.3* 34.2*  MCV 83.7 83.8  PLT 246 331   Cardiac Enzymes: No results for input(s): CKTOTAL, CKMB, CKMBINDEX, TROPONINI in the last 168 hours. BNP: Invalid input(s): POCBNP CBG: Recent Labs  Lab 07/07/17 0814 07/07/17 1206 07/07/17 1633 07/07/17 2133 07/08/17 0716  GLUCAP 136* 140* 226* 161* 186*   D-Dimer No results for input(s): DDIMER in the last 72 hours. Hgb A1c No results for input(s): HGBA1C in the last 72 hours. Lipid Profile No results for input(s): CHOL, HDL, LDLCALC, TRIG,  CHOLHDL, LDLDIRECT in the last 72 hours. Thyroid function studies No results for input(s): TSH, T4TOTAL, T3FREE, THYROIDAB in the last 72 hours.  Invalid input(s): FREET3 Anemia work up No  results for input(s): VITAMINB12, FOLATE, FERRITIN, TIBC, IRON, RETICCTPCT in the last 72 hours. Urinalysis    Component Value Date/Time   COLORURINE YELLOW 10/26/2012 0854   APPEARANCEUR CLEAR 10/26/2012 0854   LABSPEC 1.012 10/26/2012 0854   PHURINE 5.5 10/26/2012 0854   GLUCOSEU NEGATIVE 10/26/2012 0854   HGBUR NEGATIVE 10/26/2012 0854   BILIRUBINUR NEGATIVE 10/26/2012 0854   KETONESUR NEGATIVE 10/26/2012 0854   PROTEINUR NEGATIVE 10/26/2012 0854   UROBILINOGEN 0.2 10/26/2012 0854   NITRITE NEGATIVE 10/26/2012 0854   LEUKOCYTESUR NEGATIVE 10/26/2012 0854   Sepsis Labs Invalid input(s): PROCALCITONIN,  WBC,  LACTICIDVEN Microbiology No results found for this or any previous visit (from the past 240 hour(s)).   Time coordinating discharge: Over 30 minutes  SIGNED:   Marene Lenz, MD  [facility] 07/08/2017, 8:54 AM Pager [phone]  If 7PM-7AM, please contact night-coverage [email or web address] Password [email or web address]

## 2017-07-08 NOTE — Progress Notes (Signed)
MD notified of update needed for patients prescribed medications for home. Pharmacy listed on the AVS is an old pharmacy no longer used. Pharmacy also notified of update.

## 2017-07-10 ENCOUNTER — Ambulatory Visit: Payer: Medicare Other | Admitting: Pulmonary Disease

## 2017-07-20 ENCOUNTER — Encounter: Payer: Self-pay | Admitting: Internal Medicine

## 2017-07-26 ENCOUNTER — Encounter: Payer: Self-pay | Admitting: Pulmonary Disease

## 2017-07-26 ENCOUNTER — Ambulatory Visit (INDEPENDENT_AMBULATORY_CARE_PROVIDER_SITE_OTHER)
Admission: RE | Admit: 2017-07-26 | Discharge: 2017-07-26 | Disposition: A | Discharge status: Home / Self Care | Payer: Medicare Other | Source: Ambulatory Visit | Attending: Pulmonary Disease | Admitting: Pulmonary Disease

## 2017-07-26 ENCOUNTER — Ambulatory Visit (INDEPENDENT_AMBULATORY_CARE_PROVIDER_SITE_OTHER): Payer: Medicare Other | Admitting: Pulmonary Disease

## 2017-07-26 VITALS — BP 120/74 | HR 58 | Ht 59.0 in | Wt 222.0 lb

## 2017-07-26 DIAGNOSIS — R0602 Shortness of breath: Secondary | ICD-10-CM | POA: Diagnosis not present

## 2017-07-26 DIAGNOSIS — J4530 Mild persistent asthma, uncomplicated: Secondary | ICD-10-CM

## 2017-07-26 MED ORDER — AZITHROMYCIN 250 MG PO TABS: ORAL_TABLET | ORAL | 0 refills | Status: AC

## 2017-07-26 MED ORDER — PREDNISONE 10 MG PO TABS: ORAL_TABLET | ORAL | 0 refills | Status: DC
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
## 2017-07-26 NOTE — Patient Instructions (Signed)
We will get an x-ray today We will give you a Z-Pak and prednisone taper starting at 40 mg.  Reduce dose by 10 mg every 3 days Continue using your trelegy inhaler. Follow-up in 1 month.

## 2017-07-27 ENCOUNTER — Encounter (HOSPITAL_COMMUNITY): Payer: Self-pay

## 2017-07-27 ENCOUNTER — Ambulatory Visit (HOSPITAL_COMMUNITY): Admit: 2017-07-27 | Payer: Medicare Other | Admitting: Gastroenterology

## 2017-07-27 SURGERY — COLONOSCOPY WITH PROPOFOL
Anesthesia: Monitor Anesthesia Care

## 2017-07-28 ENCOUNTER — Telehealth: Payer: Self-pay | Admitting: Pulmonary Disease

## 2017-07-28 NOTE — Telephone Encounter (Signed)
Saw the results of the cxr where pt had received a phone call from [facility] at 11:52.  Nothing further needed

## 2017-08-04 ENCOUNTER — Ambulatory Visit (INDEPENDENT_AMBULATORY_CARE_PROVIDER_SITE_OTHER): Payer: Medicare Other | Admitting: Podiatry

## 2017-08-04 ENCOUNTER — Encounter: Payer: Self-pay | Admitting: Podiatry

## 2017-08-04 DIAGNOSIS — E119 Type 2 diabetes mellitus without complications: Secondary | ICD-10-CM

## 2017-08-04 DIAGNOSIS — B351 Tinea unguium: Secondary | ICD-10-CM

## 2017-08-04 DIAGNOSIS — M79674 Pain in right toe(s): Secondary | ICD-10-CM

## 2017-08-04 DIAGNOSIS — M79675 Pain in left toe(s): Secondary | ICD-10-CM

## 2017-08-04 NOTE — Progress Notes (Signed)
Complaint:  Visit Type: Patient returns to my office for continued preventative foot care services. Complaint: Patient states" my nails have grown long and thick and become painful to walk and wear shoes" Patient has been diagnosed with DM with no foot complications. The patient presents for preventative foot care services. No changes to ROS  Podiatric Exam: Vascular: dorsalis pedis and posterior tibial pulses are palpable bilateral. Capillary return is immediate. Temperature gradient is WNL. Skin turgor WNL  Sensorium: Normal Semmes Weinstein monofilament test. Normal tactile sensation bilaterally. Nail Exam: Pt has thick disfigured discolored nails with subungual debris noted bilateral entire nail hallux through fifth toenails Ulcer Exam: There is no evidence of ulcer or pre-ulcerative changes or infection. Orthopedic Exam: Muscle tone and strength are WNL. No limitations in general ROM. No crepitus or effusions noted. Foot type and digits show no abnormalities. Bony prominences are unremarkable. Skin: No Porokeratosis. No infection or ulcers  Diagnosis:  Onychomycosis, , Pain in right toe, pain in left toes  Treatment & Plan Procedures and Treatment: Consent by patient was obtained for treatment procedures.   Debridement of mycotic and hypertrophic toenails, 1 through 5 bilateral and clearing of subungual debris. No ulceration, no infection noted.  Return Visit-Office Procedure: Patient instructed to return to the office for a follow up visit 3 months for continued evaluation and treatment.    Altovise Wahler DPM 

## 2017-08-15 ENCOUNTER — Encounter: Payer: Self-pay | Admitting: Family Medicine

## 2017-08-23 ENCOUNTER — Encounter: Payer: Self-pay | Admitting: Pulmonary Disease

## 2017-08-23 ENCOUNTER — Ambulatory Visit (INDEPENDENT_AMBULATORY_CARE_PROVIDER_SITE_OTHER): Payer: Medicare Other | Admitting: Pulmonary Disease

## 2017-08-23 ENCOUNTER — Other Ambulatory Visit: Payer: Self-pay | Admitting: Pulmonary Disease

## 2017-08-23 VITALS — BP 122/80 | HR 81 | Ht 59.0 in | Wt 223.0 lb

## 2017-08-23 DIAGNOSIS — J4530 Mild persistent asthma, uncomplicated: Secondary | ICD-10-CM

## 2017-08-23 DIAGNOSIS — R0602 Shortness of breath: Secondary | ICD-10-CM

## 2017-08-23 MED ORDER — MONTELUKAST SODIUM 10 MG PO TABS: 10.0000 mg | ORAL_TABLET | Freq: Every day | ORAL | 11 refills | Status: DC

## 2017-08-23 MED ORDER — TRELEGY ELLIPTA 100-62.5-25 MCG/INH IN AEPB: 1.0000 | INHALATION_SPRAY | Freq: Every day | RESPIRATORY_TRACT | 3 refills | Status: DC

## 2017-08-23 MED ORDER — OMEPRAZOLE 20 MG PO CPDR: 20.0000 mg | DELAYED_RELEASE_CAPSULE | Freq: Every day | ORAL | 11 refills | Status: DC

## 2017-08-23 NOTE — Patient Instructions (Signed)
We will start you on Singulair daily and Prilosec 20 mg daily Continue the trelegy inhaler Follow-up in 3 months
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
## 2017-08-24 ENCOUNTER — Telehealth: Payer: Self-pay | Admitting: Interventional Cardiology

## 2017-08-24 DIAGNOSIS — I5033 Acute on chronic diastolic (congestive) heart failure: Secondary | ICD-10-CM

## 2017-08-24 NOTE — Telephone Encounter (Signed)
Left message for patient to call back  

## 2017-08-24 NOTE — Telephone Encounter (Signed)
Pt c/o swelling: STAT is pt has developed SOB within 24 hours  1) How much weight have you gained and in what time span? 7.2 lbs within 4 days   2) If swelling, where is the swelling located? Feet  3) Are you currently taking a fluid pill? Yes   4) Are you currently SOB? No, going on for a couple of days  5) Do you have a log of your daily weights (if so, list)?   6) Have you gained 3 pounds in a day or 5 pounds in a week? yes  7) Have you traveled recently? No  Minette Brine asks that you please reach out to the patient and Minette Brine is faxing over the weight readings.

## 2017-08-24 NOTE — Telephone Encounter (Signed)
Spoke with patient who states that she has gained 7.2 lbs in the past 4 days. She states that her feet are swollen as well as her abdomen. Patient states that she has some slight SOB on exertion. Patient takes lasix 40 mg QD and K-dur 20 mEq QD. Instructed for patient to elevate her legs and wear compression stockings to help minimize swelling. Instructed for the patient to limit the amount of salt in her diet which includes adding salt and also consuming foods that are high in sodium such as canned foods, processed foods, etc. Instructed the patient to weigh herself daily at the same time with the same amount of clothes. Instructed for the patient to take an extra lasix and potassium x 3 days and then return to her normal dose. Patient will come in next week for BMET. Made patient aware that I will call and check on her Monday to see how she is doing. Patient verbalized understanding and thanked me for the call.

## 2017-08-28 NOTE — Telephone Encounter (Signed)
Called patient again to follow up. Patient states that her weight is back down to 219 lbs. Patient states that she does not have any SOB or LEE. She states that her abdominal swelling has improved as well. Instructed patient to continue to elevate her legs and wear compression stockings to help minimize swelling. Instructed for the patient to continue to limit the amount of salt in her diet which includes adding salt and also consuming foods that are high in sodium such as canned foods, processed foods, etc. Instructed the patient to continue to weigh herself daily at the same time with the same amount of clothes. Instructed patient to let us know if her symptoms return. Patient verbalized understanding and thanked me for the call.

## 2017-08-28 NOTE — Telephone Encounter (Signed)
Attempted to touch base and follow up with patient, but there was no answer and the phone just kept ringing. Will try again at a later time.

## 2017-08-30 ENCOUNTER — Other Ambulatory Visit: Payer: Medicare Other | Admitting: *Deleted

## 2017-08-30 DIAGNOSIS — I5033 Acute on chronic diastolic (congestive) heart failure: Secondary | ICD-10-CM

## 2017-08-30 LAB — BASIC METABOLIC PANEL
BUN/Creatinine Ratio: 13 (ref 12–28)
BUN: 22 mg/dL (ref 8–27)
CO2: 26 mmol/L (ref 20–29)
Calcium: 9.5 mg/dL (ref 8.7–10.3)
Chloride: 96 mmol/L (ref 96–106)
Creatinine, Ser: 1.67 mg/dL — ABNORMAL HIGH (ref 0.57–1.00)
GFR calc Af Amer: 35 mL/min/{1.73_m2} — ABNORMAL LOW (ref >59)
GFR calc non Af Amer: 30 mL/min/{1.73_m2} — ABNORMAL LOW (ref >59)
Glucose: 198 mg/dL — ABNORMAL HIGH (ref 65–99)
Potassium: 4 mmol/L (ref 3.5–5.2)
Sodium: 137 mmol/L (ref 134–144)

## 2017-09-01 ENCOUNTER — Telehealth: Payer: Self-pay | Admitting: Interventional Cardiology

## 2017-09-01 DIAGNOSIS — I509 Heart failure, unspecified: Secondary | ICD-10-CM

## 2017-09-01 NOTE — Telephone Encounter (Signed)
New message     Pt c/o swelling: STAT is pt has developed SOB within 24 hours  1) How much weight have you gained and in what time span? 5.6lbs in 2 days  2) If swelling, where is the swelling located? UNKNOWN  3) Are you currently taking a fluid pill? YES  4) Are you currently SOB? NO  5) Do you have a log of your daily weights (if so, list)? [employer] to fax  6) Have you gained 3 pounds in a day or 5 pounds in a week? 5.6lbs in 2 days  7) Have you traveled recently? NO

## 2017-09-01 NOTE — Telephone Encounter (Signed)
Left detailed message for Dawn at [facility] letting her know that I discussed with Dr. Irish Lack and we will have patient take lasix 40 mg BID and potassium 20 mEq BID for 1 week and check a BMET to determine if patient should continue at this dose. Let her know that I would call and make the patient aware. Instructed for her to call back with any questions.

## 2017-09-01 NOTE — Telephone Encounter (Signed)
Called and made patient aware of instructions from Dr. Irish Lack to take lasix 40 mg BID and potassium 20 mEq BID for 1 week and check a BMET in 1 week to determine if she should continue at that dose. Patient verbalized understanding and thanked me for the call. Lab appt made for 4/12.

## 2017-09-08 ENCOUNTER — Telehealth: Payer: Self-pay | Admitting: Interventional Cardiology

## 2017-09-08 ENCOUNTER — Other Ambulatory Visit: Payer: Medicare Other

## 2017-09-08 NOTE — Telephone Encounter (Signed)
Returned call to patient. Patient was taking double does of lasix and potassium x 1 week and was suppose to have BMET today to determine whether she should continue at the higher dose. Patient had to reschedule lab appointment for Monday and wanted to know if she should continue taking the lasix 40 mg BID and potassium 20 mEq BID or if she should return to her normal dose of lasix 40 mg QD and potassium 20 mEq QD. Patient denies SOB, LEE, and states that her weight is back down. Advised patient to return to normal dose and we will see what her labs look like on Monday. Patient verbalized understanding and thanked me for the call.

## 2017-09-08 NOTE — Telephone Encounter (Signed)
New message    Pt c/o medication issue:  1. Name of Medication: furosemide (LASIX) 40 MG tablet and potassium chloride SA (K-DUR,KLOR-CON) 20 MEQ tablet  2. How are you currently taking this medication (dosage and times per day)? As prescribed  3. Are you having a reaction (difficulty breathing--STAT)? No  4. What is your medication issue? Patient would like to know if she should continue medication until 09/11/2017. Labs rescheduled

## 2017-09-11 ENCOUNTER — Other Ambulatory Visit: Payer: Medicare Other | Admitting: *Deleted

## 2017-09-11 DIAGNOSIS — I509 Heart failure, unspecified: Secondary | ICD-10-CM

## 2017-09-11 LAB — BASIC METABOLIC PANEL
BUN/Creatinine Ratio: 11 — ABNORMAL LOW (ref 12–28)
BUN: 19 mg/dL (ref 8–27)
CO2: 25 mmol/L (ref 20–29)
Calcium: 9.2 mg/dL (ref 8.7–10.3)
Chloride: 97 mmol/L (ref 96–106)
Creatinine, Ser: 1.69 mg/dL — ABNORMAL HIGH (ref 0.57–1.00)
GFR calc Af Amer: 34 mL/min/{1.73_m2} — ABNORMAL LOW (ref >59)
GFR calc non Af Amer: 30 mL/min/{1.73_m2} — ABNORMAL LOW (ref >59)
Glucose: 191 mg/dL — ABNORMAL HIGH (ref 65–99)
Potassium: 3.9 mmol/L (ref 3.5–5.2)
Sodium: 136 mmol/L (ref 134–144)

## 2017-09-12 ENCOUNTER — Telehealth: Payer: Self-pay

## 2017-09-12 MED ORDER — POTASSIUM CHLORIDE CRYS ER 20 MEQ PO TBCR: 20.0000 meq | EXTENDED_RELEASE_TABLET | Freq: Two times a day (BID) | ORAL | 11 refills | Status: DC

## 2017-09-12 MED ORDER — FUROSEMIDE 40 MG PO TABS: 40.0000 mg | ORAL_TABLET | Freq: Two times a day (BID) | ORAL | 11 refills | Status: DC

## 2017-09-12 NOTE — Telephone Encounter (Signed)
-----   Message from Jettie Booze, MD sent at 09/12/2017 12:40 PM EDT ----- Electrolytes stable.  Can continue current dose of diuretics.

## 2017-09-12 NOTE — Telephone Encounter (Signed)
Called and made patient aware that her kidney function and potassium are stable and instructed her to continue taking lasix 40 mg BID and potassium 20 mEq BID. Instructed patient to continue to monitor her weight daily, keep legs elevated, and avoid salt in her diet. Patient verbalized understanding and thanked me for the call.

## 2017-09-28 ENCOUNTER — Encounter: Payer: Medicare Other | Admitting: Internal Medicine

## 2017-09-28 ENCOUNTER — Telehealth: Payer: Self-pay

## 2017-09-28 NOTE — Telephone Encounter (Signed)
lmtcb x1 for pt. 

## 2017-09-28 NOTE — Telephone Encounter (Signed)
-----   Message from Marshell Garfinkel, MD sent at 09/28/2017  4:56 PM EDT ----- Pt is on both omeprazole and Protonix. Can you stop the omeprazole. Thanks

## 2017-09-29 NOTE — Telephone Encounter (Signed)
Patient returned call, CB is [phone]

## 2017-09-29 NOTE — Telephone Encounter (Signed)
Spoke with patient. She is willing to D/C omeprazole. Will remove this from her medication list. Nothing else needed at time of call.

## 2017-10-10 ENCOUNTER — Telehealth: Payer: Self-pay | Admitting: Internal Medicine

## 2017-10-10 NOTE — Telephone Encounter (Signed)
Received from Jackson Latino forwarded 30 pages to Dr Henrene Pastor.

## 2017-10-11 ENCOUNTER — Telehealth: Payer: Self-pay | Admitting: Internal Medicine

## 2017-10-11 NOTE — Telephone Encounter (Signed)
Records will be placed on Dr.Perry's desk for review.

## 2017-10-13 ENCOUNTER — Telehealth: Payer: Self-pay | Admitting: Interventional Cardiology

## 2017-10-13 NOTE — Telephone Encounter (Signed)
New Message   Pt c/o swelling: STAT is pt has developed SOB within 24 hours  1) How much weight have you gained and in what time span? 3lb one week  2) If swelling, where is the swelling located? stomach  3) Are you currently taking a fluid pill? yes  4) Are you currently SOB? no  5) Do you have a log of your daily weights (if so, list)? no  6) Have you gained 3 pounds in a day or 5 pounds in a week? No, just pounds in one week  7) Have you traveled recently? No

## 2017-10-13 NOTE — Telephone Encounter (Signed)
Pt calls today with 3 lb weight gain in the last week. She denies SOB or peripheral edema. She has some abd swelling. I advised her to continue taking her lasix and potassium as ordered. I asked her to restrict her fluids to 3/4ths of what she would normally drink and abstain from salt and take out foods. I will forward this to Dr Emily Filbert RN for additional recommendation if needed.

## 2017-10-16 ENCOUNTER — Telehealth: Payer: Self-pay | Admitting: Interventional Cardiology

## 2017-10-16 NOTE — Telephone Encounter (Signed)
1) Are you calling to confirm a diagnosis or obtain personal health information (Y/N)? no   2) If so, what information is requested? Nurse says she needs to give an update on pt's condition   Please route to Medical Records or your medical records site representative Pt c/o Shortness Of Breath: STAT if SOB developed within the last 24 hours or pt is noticeably SOB on the phone  1. Are you currently SOB (can you hear that pt is SOB on the phone)? [employer] RN calling-pt not on the phone-Pt is suffering with SOB 2. How long have you been experiencing SOB? Friday-but it is worse today  3. Are you SOB when sitting or when up moving around? moving  4. Are you currently experiencing any other symptoms? Feet swelling-she will fax over pt's weight

## 2017-10-16 NOTE — Telephone Encounter (Signed)
Unfortunately, I cannot accommodate this patient at this time due to the busy nature of my practice. It should be noted that she has been seen previously by Dr. Collene Mares, Dr. Benson Norway, and Dr. Michail Sermon.

## 2017-10-16 NOTE — Telephone Encounter (Signed)
Called and spoke to patient regarding message below and call from Friday. Patient states that she is having abdominal swelling and slight SOB with activity. Patient states that she has some slight swelling in her feet. Patient states that she has gained some weight in the last week and that her normal weight is 219 lbs and she is 225 lbs today. Patient states that her SOB was worse this morning and has improved. Patient does not sound SOB over the phone. Patient states that she ate fried chicken, greens, and mashed potatoes all weekend. Patient states that she knows that this happens everytime she eats bad. Patient states that she was not going to call but the [facility] called anyway. Patient takes lasix 40 mg BID and k-dur 20 mEq BID. Instructed patient to take an extra lasix tablet today. Instructed patient to stop using salt and eating foods that are high in sodium (ie. Canned foods, processed foods, fast food, etc.). Instructed for patient to elevate legs to help with swelling. Instructed patient to continue to monitor her weight daily and let us know if she gains more than 3 lbs in 24 hours or 5 lbs in 1 week. Reiterated for the patient to avoid salt in her diet. Patient verbalized understanding and thanked me for the call.

## 2017-10-17 ENCOUNTER — Telehealth: Payer: Self-pay | Admitting: Interventional Cardiology

## 2017-10-17 NOTE — Telephone Encounter (Signed)
Patient has been notified of this 

## 2017-10-17 NOTE — Telephone Encounter (Signed)
Follow up    Nurse with [facility] heart failure program is calling in reference to patient weight and swelling. She indicated that the wright only decreased by 1/2 a pound but the swelling has decrease. She just wanted to let us know.

## 2017-10-17 NOTE — Telephone Encounter (Signed)
Spoke with Ander [facility] Nurse) in regards to patient.  She was informed to take an extra 40 mg of Lasix on 5/20, because of swelling.    The patient wants to inform us that the swelling has somewhat decreased and that she lost 1/2 pound since yesterday.  I told her to monitor her swelling over the next few days taking 40 mg bid.  Lauren will forward the message.

## 2017-10-20 ENCOUNTER — Other Ambulatory Visit: Payer: Self-pay | Admitting: Family Medicine

## 2017-10-20 DIAGNOSIS — Z1231 Encounter for screening mammogram for malignant neoplasm of breast: Secondary | ICD-10-CM

## 2017-10-23 ENCOUNTER — Telehealth: Payer: Self-pay | Admitting: Cardiology

## 2017-10-23 NOTE — Telephone Encounter (Signed)
Pt called in reporting that her weight is up this morning. States her dry weight is around 218Lbs. Has noted over the past couple of weeks this has been increasing. Has called into the office several times. Weight yesterday was 225lbs, now up to 226lb this morning. No shortness of breath, mild LE edema noted. She normally takes 40mg  BID. Instructed she could take an extra lasix today and continue to follow weight. ER precautions given. Patient understood and thanked me for follow up call.   Reino Bellis NP

## 2017-10-25 ENCOUNTER — Telehealth: Payer: Self-pay | Admitting: Interventional Cardiology

## 2017-10-25 ENCOUNTER — Encounter: Payer: Self-pay | Admitting: Interventional Cardiology

## 2017-10-25 NOTE — Telephone Encounter (Signed)
New Message   Pt c/o medication issue:  1. Name of Medication:furosemide (LASIX) 40 MG tablet  2. How are you currently taking this medication (dosage and times per day)? Take 1 tablet (40 mg total) by mouth 2 (two) times daily  3. Are you having a reaction (difficulty breathing--STAT)? no  4. What is your medication issue? Pt wants to know if she should be taking 2 or 3 tablets

## 2017-10-25 NOTE — Telephone Encounter (Signed)
Called and spoke to patient regarding messages below. Patient states that has gained weight and that she has swelling in her lower extremities. She states that she is SOB on exertion. Per [facility], the patient has gained 3-5 pounds in 1 week. Patient states that her current weight is 228 lbs. The patient currently takes lasix 40 mg BID and potassium 20 mEq BID. Patient called in over the holiday and spoke to the on call provider and was instructed to take an extra lasix x1 and continue to follow weight. Patient's last labs from 4/15 after increasing her lasix to 40 mg BID were stable. Patient states that she still has food from time to time that is high in sodium. She states that she had a salad today. Instructed the patient to avoid salt, elevate legs, and wear compression stockings. Instructed patient to take an extra lasix x1 today and that I would forward to Dr. Irish Lack for review and further recommendation. Patient verbalized understanding and thanked me for the call.

## 2017-10-25 NOTE — Telephone Encounter (Signed)
This encounter was created in error - please disregard.

## 2017-10-25 NOTE — Telephone Encounter (Signed)
New message:        Pt c/o swelling: STAT is pt has developed SOB within 24 hours  1) How much weight have you gained and in what time span? 3-5 lbs  2) If swelling, where is the swelling located? Legs/feet  3) Are you currently taking a fluid pill? yes  4) Are you currently SOB? yes  5) Do you have a log of your daily weights (if so, list)? Yes will be faxed over from [facility]  6) Have you gained 3 pounds in a day or 5 pounds in a week? 3-5  7) Have you traveled recently? No  Lauren from [employer] is calling with a weight update for pt who is in there heart program. Lauren also states we increased some dosages for pt on last week for the same issue.

## 2017-10-25 NOTE — Telephone Encounter (Signed)
Pt c/o swelling: STAT is pt has developed SOB within 24 hours  1. How much weight have you gained and in what time span? 3-5 lbs  2. If swelling, where is the swelling located? Legs/feet  3. Are you currently taking a fluid pill? yes  4. Are you currently SOB? yes  5. Do you have a log of your daily weights (if so, list)? Yes will be faxed over from [facility]  6. Have you gained 3 pounds in a day or 5 pounds in a week? 3-5  7. Have you traveled recently? No  Lauren from [facility] [employer] is calling with a weight update for pt who is in there heart program. Lauren also states we increased some dosages for pt on last week for the same issue.

## 2017-11-01 NOTE — Telephone Encounter (Signed)
OK to continue with an additional 40 mg of Lasix, (Total of 80 mg in a day), up to 4 days per week depending on her swelling.

## 2017-11-01 NOTE — Telephone Encounter (Signed)
Discussed with Dr. Irish Lack. Patient has been taking lasix 40 mg BID since 09/01/17. Last labs in our office on 4/15: Cr-1.69, K-3.9. Patient had labs drawn at PCP yesterday with Cr-1.98, K-4.2. Per Dr. Irish Lack, we will not increase her lasix anymore at this time. We will order and echo and BNP and schedule a follow-up visit for afterwards.

## 2017-11-01 NOTE — Telephone Encounter (Signed)
Left message for patient to call back  

## 2017-11-03 ENCOUNTER — Ambulatory Visit (INDEPENDENT_AMBULATORY_CARE_PROVIDER_SITE_OTHER): Payer: Medicare Other | Admitting: Podiatry

## 2017-11-03 ENCOUNTER — Encounter: Payer: Self-pay | Admitting: Podiatry

## 2017-11-03 DIAGNOSIS — M79674 Pain in right toe(s): Secondary | ICD-10-CM | POA: Diagnosis not present

## 2017-11-03 DIAGNOSIS — M79675 Pain in left toe(s): Secondary | ICD-10-CM | POA: Diagnosis not present

## 2017-11-03 DIAGNOSIS — B351 Tinea unguium: Secondary | ICD-10-CM

## 2017-11-03 DIAGNOSIS — E119 Type 2 diabetes mellitus without complications: Secondary | ICD-10-CM

## 2017-11-03 NOTE — Progress Notes (Signed)
Complaint:  Visit Type: Patient returns to my office for continued preventative foot care services. Complaint: Patient states" my nails have grown long and thick and become painful to walk and wear shoes" Patient has been diagnosed with DM with no foot complications. The patient presents for preventative foot care services. No changes to ROS  Podiatric Exam: Vascular: dorsalis pedis and posterior tibial pulses are palpable bilateral. Capillary return is immediate. Temperature gradient is WNL. Skin turgor WNL  Sensorium: Normal Semmes Weinstein monofilament test. Normal tactile sensation bilaterally. Nail Exam: Pt has thick disfigured discolored nails with subungual debris noted bilateral entire nail hallux through fifth toenails Ulcer Exam: There is no evidence of ulcer or pre-ulcerative changes or infection. Orthopedic Exam: Muscle tone and strength are WNL. No limitations in general ROM. No crepitus or effusions noted. Foot type and digits show no abnormalities. HAV  B/L. Skin: No Porokeratosis. No infection or ulcers  Diagnosis:  Onychomycosis, , Pain in right toe, pain in left toes  Treatment & Plan Procedures and Treatment: Consent by patient was obtained for treatment procedures.   Debridement of mycotic and hypertrophic toenails, 1 through 5 bilateral and clearing of subungual debris. No ulceration, no infection noted.  Patient says she is experiencing pain on and off in her feet.  She will mention this to her doctor to see if she is a candidate for gabapentin. Return Visit-Office Procedure: Patient instructed to return to the office for a follow up visit 3 months for continued evaluation and treatment.    Willmer Fellers DPM 

## 2017-11-06 NOTE — Telephone Encounter (Signed)
Follow Up:      Returning Amanda Cain's call from 11-01-17.

## 2017-11-06 NOTE — Telephone Encounter (Signed)
Left patient message in regards to Dr Hassell Done recommendation.  She will be available on Tuesday, 6/11 to call and arrange appts.  Thank you.

## 2017-11-07 ENCOUNTER — Telehealth: Payer: Self-pay | Admitting: Interventional Cardiology

## 2017-11-07 DIAGNOSIS — I5033 Acute on chronic diastolic (congestive) heart failure: Secondary | ICD-10-CM

## 2017-11-07 NOTE — Telephone Encounter (Signed)
Called and made patient aware that per Dr. Irish Lack, we will not increase her lasix anymore at this time. We will order and echo and BNP and schedule a follow-up visit for afterwards.Echo appointment scheduled for 6/18 at 9:30 Am with labs on the same day and follow up appointment scheduled with Dr. Irish Lack on 6/21 at 2:40 PM. Patient verbalized understanding and thanked me for the call.

## 2017-11-07 NOTE — Telephone Encounter (Signed)
Called and made patient aware that per Dr. Irish Lack, we will not increase her lasix anymore at this time. We will order and echo and BNP and schedule a follow-up visit for afterwards. Echo appointment scheduled for 6/18 at 9:30 Am with labs on the same day and follow up appointment scheduled with Dr. Irish Lack on 6/21 at 2:40 PM. Patient verbalized understanding and thanked me for the call.

## 2017-11-07 NOTE — Telephone Encounter (Signed)
New Message    Patient is calling requesting to speak with Dr. Irish Lack nurse in reference to the conversation she has been having about swelling and whether or not she needs to change her medication. Please call to discuss.

## 2017-11-09 ENCOUNTER — Telehealth: Payer: Self-pay | Admitting: Interventional Cardiology

## 2017-11-09 DIAGNOSIS — R0602 Shortness of breath: Secondary | ICD-10-CM

## 2017-11-09 NOTE — Telephone Encounter (Signed)
New Message   Pt c/o swelling: STAT is pt has developed SOB within 24 hours  1) How much weight have you gained and in what time span? 5lbs since yesterday   2) If swelling, where is the swelling located? ankles  3) Are you currently taking a fluid pill? yes  4) Are you currently SOB? yes  5) Do you have a log of your daily weights (if so, list)? 228 yesterday and 233 today  6) Have you gained 3 pounds in a day or 5 pounds in a week? 5lbs overnight   7) Have you traveled recently? no

## 2017-11-09 NOTE — Telephone Encounter (Signed)
Returned call to patient who states that she gained 5 lbs overnight. She states that her weight yesterday was 228 lbs and today is 233 lbs. She states that when she saw Dr. Brigitte Pulse last week she was instructed to decrease her lasix from 40 mg BID to 40 mg QD. She states that yesterday was the first day that she decreased it to 40 mg once and now has 5 lbs weight gain, increased SOB and swelling in her ankles. Patient states that she has already taken her 40 mg dose this morning. Last labs in our office on 4/15: Cr-1.69, K-3.9. Labs drawn at PCP on 6/4 with Cr-1.98, K-4.2. Patient is scheduled for echo and BNP on 6/18 and OV with Dr. Irish Lack on 6/21. Instructed for the patient to take an additional 40 mg of lasix today, avoid salt in her diet, and elevate her legs. Made patient aware that I will discuss with Dr. Irish Lack and call her back tomorrow with additional recommendations. Patient verbalized understanding and thanked me for the call.

## 2017-11-10 NOTE — Telephone Encounter (Signed)
Decrease salt intake.  Increase Lasix to 40 mg BID, BMet early next week.

## 2017-11-10 NOTE — Telephone Encounter (Signed)
Called and made patient aware of recommendations below. Will add BMET to BNP ordered for 6/18.

## 2017-11-11 ENCOUNTER — Other Ambulatory Visit: Payer: Self-pay | Admitting: Pulmonary Disease

## 2017-11-13 ENCOUNTER — Ambulatory Visit
Admission: RE | Admit: 2017-11-13 | Discharge: 2017-11-13 | Disposition: A | Discharge status: Home / Self Care | Payer: Medicare Other | Source: Ambulatory Visit | Attending: Family Medicine | Admitting: Family Medicine

## 2017-11-13 DIAGNOSIS — Z1231 Encounter for screening mammogram for malignant neoplasm of breast: Secondary | ICD-10-CM

## 2017-11-14 ENCOUNTER — Other Ambulatory Visit: Payer: Self-pay

## 2017-11-14 ENCOUNTER — Other Ambulatory Visit: Payer: Medicare Other

## 2017-11-14 ENCOUNTER — Ambulatory Visit (HOSPITAL_COMMUNITY): Payer: Medicare Other | Attending: Cardiovascular Disease

## 2017-11-14 DIAGNOSIS — E669 Obesity, unspecified: Secondary | ICD-10-CM | POA: Insufficient documentation

## 2017-11-14 DIAGNOSIS — I5033 Acute on chronic diastolic (congestive) heart failure: Secondary | ICD-10-CM | POA: Diagnosis not present

## 2017-11-14 DIAGNOSIS — R0602 Shortness of breath: Secondary | ICD-10-CM

## 2017-11-14 DIAGNOSIS — Z6841 Body Mass Index (BMI) 40.0 and over, adult: Secondary | ICD-10-CM | POA: Diagnosis not present

## 2017-11-14 DIAGNOSIS — E1122 Type 2 diabetes mellitus with diabetic chronic kidney disease: Secondary | ICD-10-CM | POA: Diagnosis not present

## 2017-11-14 DIAGNOSIS — I130 Hypertensive heart and chronic kidney disease with heart failure and stage 1 through stage 4 chronic kidney disease, or unspecified chronic kidney disease: Secondary | ICD-10-CM | POA: Diagnosis not present

## 2017-11-14 DIAGNOSIS — E785 Hyperlipidemia, unspecified: Secondary | ICD-10-CM | POA: Insufficient documentation

## 2017-11-14 DIAGNOSIS — N189 Chronic kidney disease, unspecified: Secondary | ICD-10-CM | POA: Diagnosis not present

## 2017-11-14 LAB — BASIC METABOLIC PANEL
BUN/Creatinine Ratio: 11 — ABNORMAL LOW (ref 12–28)
BUN: 18 mg/dL (ref 8–27)
CO2: 25 mmol/L (ref 20–29)
Calcium: 9.2 mg/dL (ref 8.7–10.3)
Chloride: 96 mmol/L (ref 96–106)
Creatinine, Ser: 1.66 mg/dL — ABNORMAL HIGH (ref 0.57–1.00)
GFR calc Af Amer: 35 mL/min/{1.73_m2} — ABNORMAL LOW (ref >59)
GFR calc non Af Amer: 30 mL/min/{1.73_m2} — ABNORMAL LOW (ref >59)
Glucose: 195 mg/dL — ABNORMAL HIGH (ref 65–99)
Potassium: 4.5 mmol/L (ref 3.5–5.2)
Sodium: 137 mmol/L (ref 134–144)

## 2017-11-14 LAB — PRO B NATRIURETIC PEPTIDE: NT-Pro BNP: 557 pg/mL — ABNORMAL HIGH (ref 0–301)
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
## 2017-11-17 ENCOUNTER — Encounter: Payer: Self-pay | Admitting: Interventional Cardiology

## 2017-11-17 ENCOUNTER — Ambulatory Visit (INDEPENDENT_AMBULATORY_CARE_PROVIDER_SITE_OTHER): Payer: Medicare Other | Admitting: Interventional Cardiology

## 2017-11-17 VITALS — BP 140/62 | HR 66 | Ht 59.0 in | Wt 231.0 lb

## 2017-11-17 DIAGNOSIS — I10 Essential (primary) hypertension: Secondary | ICD-10-CM

## 2017-11-17 DIAGNOSIS — I5033 Acute on chronic diastolic (congestive) heart failure: Secondary | ICD-10-CM

## 2017-11-17 DIAGNOSIS — E782 Mixed hyperlipidemia: Secondary | ICD-10-CM | POA: Diagnosis not present

## 2017-11-17 DIAGNOSIS — R0602 Shortness of breath: Secondary | ICD-10-CM

## 2017-11-17 DIAGNOSIS — N183 Chronic kidney disease, stage 3 unspecified: Secondary | ICD-10-CM

## 2017-11-17 NOTE — Patient Instructions (Signed)
Medication Instructions:  Your physician recommends that you continue on your current medications as directed. Please refer to the Current Medication list given to you today.   Labwork: None ordered  Testing/Procedures: A chest x-ray takes a picture of the organs and structures inside the chest, including the heart, lungs, and blood vessels. This test can show several things, including, whether the heart is enlarges; whether fluid is building up in the lungs; and whether pacemaker / defibrillator leads are still in place.   Follow-Up: Based on results  Any Other Special Instructions Will Be Listed Below (If Applicable).     If you need a refill on your cardiac medications before your next appointment, please call your pharmacy.

## 2017-11-20 ENCOUNTER — Telehealth: Payer: Self-pay | Admitting: Interventional Cardiology

## 2017-11-20 ENCOUNTER — Ambulatory Visit
Admission: RE | Admit: 2017-11-20 | Discharge: 2017-11-20 | Disposition: A | Discharge status: Home / Self Care | Payer: Medicare Other | Source: Ambulatory Visit | Attending: Interventional Cardiology | Admitting: Interventional Cardiology

## 2017-11-20 DIAGNOSIS — R0602 Shortness of breath: Secondary | ICD-10-CM

## 2017-11-20 NOTE — Telephone Encounter (Signed)
NEW MESSAGE   Amanda Cain at [facility] - phone [phone] ext [phone] Fax # [phone]   1) Are you calling to confirm a diagnosis or obtain personal health information (Y/N)? YES, heart failure  2) If so, what information is requested? EF%, BP, HR  Please route to Medical Records or your medical records site representative

## 2017-11-21 ENCOUNTER — Telehealth: Payer: Self-pay | Admitting: Interventional Cardiology

## 2017-11-21 MED ORDER — TORSEMIDE 20 MG PO TABS: ORAL_TABLET | ORAL | 3 refills | Status: DC

## 2017-11-21 NOTE — Telephone Encounter (Signed)
Patient made aware of results. Instructed the patient to stop lasix and start torsemide 40 mg BID x 3 days, then decrease to 20 mg BID to see if diuresis improves. Instructed the patient to continue taking potassium. Instructed patient to let us know if her SOB or LEE worsen. Instructed patient to monitor her weight daily and avoid salt in her diet.  Patient verbalizes understanding and thanked me for the call. Rx sent to preferred pharmacy.

## 2017-11-21 NOTE — Telephone Encounter (Signed)
New message    Pt is returning call to rn.

## 2017-11-21 NOTE — Telephone Encounter (Signed)
-----   Message from Jettie Booze, MD sent at 11/21/2017 11:04 AM EDT ----- stable chest xray. Change Lasix to torsemide 40 mg BID for 3 days and then down to 20 mg BID to see if she has better diuresis.

## 2017-11-23 ENCOUNTER — Ambulatory Visit: Payer: Medicare Other | Admitting: Pulmonary Disease

## 2017-12-11 ENCOUNTER — Telehealth: Payer: Self-pay | Admitting: Interventional Cardiology

## 2017-12-11 NOTE — Telephone Encounter (Signed)
Called patient about her message. Patient stated she has been congested and started drinking Gatorade. Patient stated she has gained 3 lbs in 2 days. Patient stated her BLE swelling is about the same as it was at her office visit on 6/26 and her SOB is a little worse. Patient stated currently she is taking torsemide 20 mg BID. Encouraged patient to stop drinking Gatorade and to keep a low salt diet. Encouraged patient to elevate her feet while sitting. Will forward to Dr. Irish Lack for advisement.

## 2017-12-11 NOTE — Telephone Encounter (Signed)
New message  Amanda Cain from [facility] calling to report swelling. Amanda Cain [phone] ext [phone]  Pt c/o swelling: STAT is pt has developed SOB within 24 hours  1) How much weight have you gained and in what time span? 3 pounds in 2 days  If swelling, where is the swelling located? LEGS 2) Are you currently taking a fluid pill? YES  3) Are you currently SOB?  SOME  4) Do you have a log of your daily weights (if so, list)? [employer] faxing, today patient 230.2  5) Have you gained 3 pounds in a day or 5 pounds in a week?   6) Have you traveled recently? no

## 2017-12-12 ENCOUNTER — Inpatient Hospital Stay (HOSPITAL_COMMUNITY)
Admission: EM | Admit: 2017-12-12 | Discharge: 2017-12-19 | DRG: 193 | Disposition: A | Discharge status: Home Health | Payer: Medicare Other | Attending: Family Medicine | Admitting: Family Medicine

## 2017-12-12 ENCOUNTER — Encounter (HOSPITAL_COMMUNITY): Payer: Self-pay

## 2017-12-12 ENCOUNTER — Other Ambulatory Visit: Payer: Self-pay

## 2017-12-12 ENCOUNTER — Emergency Department (HOSPITAL_COMMUNITY): Payer: Medicare Other

## 2017-12-12 DIAGNOSIS — N183 Chronic kidney disease, stage 3 unspecified: Secondary | ICD-10-CM | POA: Diagnosis present

## 2017-12-12 DIAGNOSIS — I509 Heart failure, unspecified: Secondary | ICD-10-CM | POA: Diagnosis present

## 2017-12-12 DIAGNOSIS — E1122 Type 2 diabetes mellitus with diabetic chronic kidney disease: Secondary | ICD-10-CM | POA: Diagnosis present

## 2017-12-12 DIAGNOSIS — Z7951 Long term (current) use of inhaled steroids: Secondary | ICD-10-CM | POA: Diagnosis not present

## 2017-12-12 DIAGNOSIS — F419 Anxiety disorder, unspecified: Secondary | ICD-10-CM | POA: Diagnosis present

## 2017-12-12 DIAGNOSIS — F209 Schizophrenia, unspecified: Secondary | ICD-10-CM | POA: Diagnosis present

## 2017-12-12 DIAGNOSIS — E039 Hypothyroidism, unspecified: Secondary | ICD-10-CM | POA: Diagnosis present

## 2017-12-12 DIAGNOSIS — I130 Hypertensive heart and chronic kidney disease with heart failure and stage 1 through stage 4 chronic kidney disease, or unspecified chronic kidney disease: Secondary | ICD-10-CM | POA: Diagnosis present

## 2017-12-12 DIAGNOSIS — N179 Acute kidney failure, unspecified: Secondary | ICD-10-CM | POA: Diagnosis present

## 2017-12-12 DIAGNOSIS — K219 Gastro-esophageal reflux disease without esophagitis: Secondary | ICD-10-CM | POA: Diagnosis present

## 2017-12-12 DIAGNOSIS — Z886 Allergy status to analgesic agent status: Secondary | ICD-10-CM

## 2017-12-12 DIAGNOSIS — E785 Hyperlipidemia, unspecified: Secondary | ICD-10-CM | POA: Diagnosis present

## 2017-12-12 DIAGNOSIS — E118 Type 2 diabetes mellitus with unspecified complications: Secondary | ICD-10-CM | POA: Diagnosis not present

## 2017-12-12 DIAGNOSIS — F329 Major depressive disorder, single episode, unspecified: Secondary | ICD-10-CM | POA: Diagnosis present

## 2017-12-12 DIAGNOSIS — J189 Pneumonia, unspecified organism: Principal | ICD-10-CM | POA: Diagnosis present

## 2017-12-12 DIAGNOSIS — D649 Anemia, unspecified: Secondary | ICD-10-CM | POA: Diagnosis present

## 2017-12-12 DIAGNOSIS — Z7984 Long term (current) use of oral hypoglycemic drugs: Secondary | ICD-10-CM | POA: Diagnosis not present

## 2017-12-12 DIAGNOSIS — Z6841 Body Mass Index (BMI) 40.0 and over, adult: Secondary | ICD-10-CM

## 2017-12-12 DIAGNOSIS — I272 Pulmonary hypertension, unspecified: Secondary | ICD-10-CM | POA: Diagnosis present

## 2017-12-12 DIAGNOSIS — J45901 Unspecified asthma with (acute) exacerbation: Secondary | ICD-10-CM | POA: Diagnosis present

## 2017-12-12 DIAGNOSIS — E875 Hyperkalemia: Secondary | ICD-10-CM | POA: Diagnosis present

## 2017-12-12 DIAGNOSIS — Z79899 Other long term (current) drug therapy: Secondary | ICD-10-CM

## 2017-12-12 DIAGNOSIS — N184 Chronic kidney disease, stage 4 (severe): Secondary | ICD-10-CM | POA: Diagnosis present

## 2017-12-12 DIAGNOSIS — I10 Essential (primary) hypertension: Secondary | ICD-10-CM | POA: Diagnosis not present

## 2017-12-12 DIAGNOSIS — Z96651 Presence of right artificial knee joint: Secondary | ICD-10-CM | POA: Diagnosis present

## 2017-12-12 DIAGNOSIS — I5033 Acute on chronic diastolic (congestive) heart failure: Secondary | ICD-10-CM | POA: Diagnosis present

## 2017-12-12 LAB — BASIC METABOLIC PANEL
Anion gap: 11 (ref 5–15)
BUN: 30 mg/dL — ABNORMAL HIGH (ref 8–23)
CO2: 28 mmol/L (ref 22–32)
Calcium: 9.1 mg/dL (ref 8.9–10.3)
Chloride: 93 mmol/L — ABNORMAL LOW (ref 98–111)
Creatinine, Ser: 2.21 mg/dL — ABNORMAL HIGH (ref 0.44–1.00)
GFR calc Af Amer: 24 mL/min — ABNORMAL LOW (ref >60)
GFR calc non Af Amer: 21 mL/min — ABNORMAL LOW (ref >60)
Glucose, Bld: 194 mg/dL — ABNORMAL HIGH (ref 70–99)
Potassium: 4.3 mmol/L (ref 3.5–5.1)
Sodium: 132 mmol/L — ABNORMAL LOW (ref 135–145)

## 2017-12-12 LAB — BRAIN NATRIURETIC PEPTIDE: B Natriuretic Peptide: 212.5 pg/mL — ABNORMAL HIGH (ref 0.0–100.0)

## 2017-12-12 LAB — CBC WITH DIFFERENTIAL/PLATELET
Basophils Absolute: 0 10*3/uL (ref 0.0–0.1)
Basophils Relative: 0 %
Eosinophils Absolute: 0.1 10*3/uL (ref 0.0–0.7)
Eosinophils Relative: 1 %
HCT: 31.2 % — ABNORMAL LOW (ref 36.0–46.0)
Hemoglobin: 9.9 g/dL — ABNORMAL LOW (ref 12.0–15.0)
Lymphocytes Relative: 10 %
Lymphs Abs: 1.2 10*3/uL (ref 0.7–4.0)
MCH: 27.6 pg (ref 26.0–34.0)
MCHC: 31.7 g/dL (ref 30.0–36.0)
MCV: 86.9 fL (ref 78.0–100.0)
Monocytes Absolute: 0.6 10*3/uL (ref 0.1–1.0)
Monocytes Relative: 6 %
Neutro Abs: 9.5 10*3/uL — ABNORMAL HIGH (ref 1.7–7.7)
Neutrophils Relative %: 83 %
Platelets: 338 10*3/uL (ref 150–400)
RBC: 3.59 MIL/uL — ABNORMAL LOW (ref 3.87–5.11)
RDW: 15.8 % — ABNORMAL HIGH (ref 11.5–15.5)
WBC: 11.4 10*3/uL — ABNORMAL HIGH (ref 4.0–10.5)

## 2017-12-12 LAB — TROPONIN I: Troponin I: <0.03 ng/mL (ref <0.03)

## 2017-12-12 MED ORDER — FUROSEMIDE 10 MG/ML IJ SOLN
80.0000 mg | Freq: Once | INTRAMUSCULAR | Status: AC
  Administered 2017-12-12: 80 mg via INTRAVENOUS
  Filled 2017-12-12: qty 8

## 2017-12-12 MED ORDER — ALBUTEROL SULFATE (2.5 MG/3ML) 0.083% IN NEBU
5.0000 mg | INHALATION_SOLUTION | Freq: Once | RESPIRATORY_TRACT | Status: AC
  Administered 2017-12-12: 5 mg via RESPIRATORY_TRACT
  Filled 2017-12-12: qty 6

## 2017-12-12 NOTE — ED Notes (Signed)
Bed: JW90 Expected date:  Expected time:  Means of arrival:  Comments: Res A

## 2017-12-12 NOTE — ED Triage Notes (Signed)
Pt BIB EMS from home c/o shortness of breath starting approx. 30 min ago. Hx asthma, CHF. Given 10mg  albuterol 0.5 mg atrovent en route by EMS. Pt also c/o increased bilat feet swelling x1 week
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
## 2017-12-12 NOTE — Telephone Encounter (Signed)
Returned called to patient. Patient states that she was 230 lbs yesterday and she is down to 228 lbs today. She states that her SOB and LEE is slightly better. Patient is still taking torsemide 20 mg BID. Patient states that she has had a cold and her PCP instructed her to take some OTC cold meds. Instructed patient to continue to avoid drinking the Gatorade, avoid salt in her diet, continue to monitor her weight daily, and elevate her legs to help with swelling. Instructed patient to let us know if her Sx change or worsen. Patient verbalized understanding, was in agreement with the plan, and thanked me for the call.

## 2017-12-12 NOTE — ED Notes (Addendum)
Ambulated patient - O2SAT stayed around 96%, lowest was 94%; RR mainly stayed in the 30s, highest was 45 breaths/min.

## 2017-12-12 NOTE — ED Notes (Signed)
Bed: RESA Expected date:  Expected time:  Means of arrival:  Comments: Respiratory distress 

## 2017-12-12 NOTE — Telephone Encounter (Signed)
Follow up    Patient calling to report she is 228 pounds today. Patient requesting follow up call from nurse

## 2017-12-13 ENCOUNTER — Encounter (HOSPITAL_COMMUNITY): Payer: Self-pay | Admitting: Internal Medicine

## 2017-12-13 DIAGNOSIS — E118 Type 2 diabetes mellitus with unspecified complications: Secondary | ICD-10-CM

## 2017-12-13 DIAGNOSIS — I509 Heart failure, unspecified: Secondary | ICD-10-CM

## 2017-12-13 DIAGNOSIS — J189 Pneumonia, unspecified organism: Secondary | ICD-10-CM

## 2017-12-13 LAB — HIV ANTIBODY (ROUTINE TESTING W REFLEX): HIV Screen 4th Generation wRfx: NONREACTIVE

## 2017-12-13 LAB — CBG MONITORING, ED: Glucose-Capillary: 87 mg/dL (ref 70–99)

## 2017-12-13 MED ORDER — PANTOPRAZOLE SODIUM 40 MG PO TBEC
40.0000 mg | DELAYED_RELEASE_TABLET | Freq: Every day | ORAL | Status: DC
  Administered 2017-12-13 – 2017-12-19 (×6): 40 mg via ORAL
  Filled 2017-12-13 (×6): qty 1

## 2017-12-13 MED ORDER — FLUTICASONE PROPIONATE 50 MCG/ACT NA SUSP
1.0000 | Freq: Every day | NASAL | Status: DC
  Administered 2017-12-13 – 2017-12-19 (×6): 1 via NASAL
  Filled 2017-12-13 (×2): qty 16

## 2017-12-13 MED ORDER — LINAGLIPTIN 5 MG PO TABS
5.0000 mg | ORAL_TABLET | Freq: Every day | ORAL | Status: DC
  Administered 2017-12-13 – 2017-12-19 (×7): 5 mg via ORAL
  Filled 2017-12-13 (×7): qty 1

## 2017-12-13 MED ORDER — TORSEMIDE 20 MG PO TABS: 20.0000 mg | ORAL_TABLET | Freq: Two times a day (BID) | ORAL | Status: DC

## 2017-12-13 MED ORDER — MONTELUKAST SODIUM 10 MG PO TABS
10.0000 mg | ORAL_TABLET | Freq: Every day | ORAL | Status: DC
  Administered 2017-12-13 – 2017-12-18 (×6): 10 mg via ORAL
  Filled 2017-12-13 (×6): qty 1

## 2017-12-13 MED ORDER — ALBUTEROL SULFATE (2.5 MG/3ML) 0.083% IN NEBU
2.5000 mg | INHALATION_SOLUTION | RESPIRATORY_TRACT | Status: DC | PRN
  Administered 2017-12-16: 2.5 mg via RESPIRATORY_TRACT
  Filled 2017-12-13: qty 3

## 2017-12-13 MED ORDER — SUCRALFATE 1 G PO TABS
1.0000 g | ORAL_TABLET | Freq: Two times a day (BID) | ORAL | Status: DC
  Administered 2017-12-13 – 2017-12-19 (×12): 1 g via ORAL
  Filled 2017-12-13 (×13): qty 1

## 2017-12-13 MED ORDER — ESCITALOPRAM OXALATE 20 MG PO TABS
20.0000 mg | ORAL_TABLET | Freq: Every day | ORAL | Status: DC
  Administered 2017-12-13 – 2017-12-18 (×6): 20 mg via ORAL
  Filled 2017-12-13 (×6): qty 1

## 2017-12-13 MED ORDER — SODIUM CHLORIDE 0.9 % IV SOLN
1.0000 g | INTRAVENOUS | Status: AC
  Administered 2017-12-13 – 2017-12-17 (×6): 1 g via INTRAVENOUS
  Filled 2017-12-13: qty 1
  Filled 2017-12-13 (×5): qty 10

## 2017-12-13 MED ORDER — SACCHAROMYCES BOULARDII 250 MG PO CAPS
250.0000 mg | ORAL_CAPSULE | Freq: Two times a day (BID) | ORAL | Status: DC
  Administered 2017-12-13 – 2017-12-19 (×11): 250 mg via ORAL
  Filled 2017-12-13 (×12): qty 1

## 2017-12-13 MED ORDER — ATORVASTATIN CALCIUM 40 MG PO TABS
80.0000 mg | ORAL_TABLET | Freq: Every day | ORAL | Status: DC
  Administered 2017-12-13 – 2017-12-19 (×7): 80 mg via ORAL
  Filled 2017-12-13 (×7): qty 2

## 2017-12-13 MED ORDER — SODIUM CHLORIDE 0.9% FLUSH
3.0000 mL | Freq: Two times a day (BID) | INTRAVENOUS | Status: DC
Start: 2017-12-13 — End: 2017-12-19
  Administered 2017-12-13 – 2017-12-19 (×10): 3 mL via INTRAVENOUS

## 2017-12-13 MED ORDER — BLISTEX MEDICATED EX OINT
TOPICAL_OINTMENT | CUTANEOUS | Status: DC | PRN
  Filled 2017-12-13: qty 6.3

## 2017-12-13 MED ORDER — EZETIMIBE 10 MG PO TABS
10.0000 mg | ORAL_TABLET | Freq: Every day | ORAL | Status: DC
  Administered 2017-12-13 – 2017-12-19 (×7): 10 mg via ORAL
  Filled 2017-12-13 (×7): qty 1

## 2017-12-13 MED ORDER — PALIPERIDONE ER 3 MG PO TB24
3.0000 mg | ORAL_TABLET | Freq: Every day | ORAL | Status: DC
  Administered 2017-12-13 – 2017-12-18 (×6): 3 mg via ORAL
  Filled 2017-12-13 (×7): qty 1

## 2017-12-13 MED ORDER — FLUTICASONE-UMECLIDIN-VILANT 100-62.5-25 MCG/INH IN AEPB: 1.0000 | INHALATION_SPRAY | Freq: Every day | RESPIRATORY_TRACT | Status: DC

## 2017-12-13 MED ORDER — FERROUS SULFATE 325 (65 FE) MG PO TABS
325.0000 mg | ORAL_TABLET | Freq: Two times a day (BID) | ORAL | Status: DC
  Administered 2017-12-13 – 2017-12-19 (×12): 325 mg via ORAL
  Filled 2017-12-13 (×13): qty 1

## 2017-12-13 MED ORDER — POTASSIUM CHLORIDE CRYS ER 20 MEQ PO TBCR
20.0000 meq | EXTENDED_RELEASE_TABLET | Freq: Two times a day (BID) | ORAL | Status: DC
  Administered 2017-12-13 (×2): 20 meq via ORAL
  Filled 2017-12-13 (×2): qty 1

## 2017-12-13 MED ORDER — LORAZEPAM 0.5 MG PO TABS
0.5000 mg | ORAL_TABLET | Freq: Every day | ORAL | Status: DC
  Administered 2017-12-13: 0.5 mg via ORAL
  Filled 2017-12-13: qty 1

## 2017-12-13 MED ORDER — DILTIAZEM HCL ER COATED BEADS 180 MG PO CP24
360.0000 mg | ORAL_CAPSULE | Freq: Every day | ORAL | Status: DC
  Administered 2017-12-13 – 2017-12-19 (×7): 360 mg via ORAL
  Filled 2017-12-13 (×7): qty 2

## 2017-12-13 MED ORDER — POLYVINYL ALCOHOL 1.4 % OP SOLN: 1.0000 [drp] | OPHTHALMIC | Status: DC | PRN

## 2017-12-13 MED ORDER — METOPROLOL SUCCINATE ER 100 MG PO TB24
100.0000 mg | ORAL_TABLET | Freq: Every morning | ORAL | Status: DC
  Administered 2017-12-13 – 2017-12-19 (×7): 100 mg via ORAL
  Filled 2017-12-13 (×7): qty 1

## 2017-12-13 MED ORDER — SODIUM CHLORIDE 0.9 % IV SOLN
500.0000 mg | INTRAVENOUS | Status: DC
  Administered 2017-12-13 – 2017-12-14 (×2): 500 mg via INTRAVENOUS
  Filled 2017-12-13 (×2): qty 500

## 2017-12-13 MED ORDER — OLOPATADINE HCL 0.1 % OP SOLN
1.0000 [drp] | Freq: Every day | OPHTHALMIC | Status: DC
  Administered 2017-12-13 – 2017-12-19 (×6): 1 [drp] via OPHTHALMIC
  Filled 2017-12-13 (×2): qty 5

## 2017-12-13 MED ORDER — METHYLPREDNISOLONE SODIUM SUCC 125 MG IJ SOLR
80.0000 mg | Freq: Three times a day (TID) | INTRAMUSCULAR | Status: DC
  Administered 2017-12-13: 80 mg via INTRAVENOUS
  Filled 2017-12-13: qty 2

## 2017-12-13 MED ORDER — ESTRADIOL 1 MG PO TABS
0.5000 mg | ORAL_TABLET | Freq: Every morning | ORAL | Status: DC
  Administered 2017-12-13 – 2017-12-19 (×7): 0.5 mg via ORAL
  Filled 2017-12-13 (×7): qty 0.5

## 2017-12-13 MED ORDER — GLIMEPIRIDE 1 MG PO TABS
1.0000 mg | ORAL_TABLET | Freq: Every day | ORAL | Status: DC
  Administered 2017-12-14 – 2017-12-15 (×2): 1 mg via ORAL
  Filled 2017-12-13 (×2): qty 1

## 2017-12-13 MED ORDER — GUAIFENESIN-DM 100-10 MG/5ML PO SYRP
10.0000 mL | ORAL_SOLUTION | ORAL | Status: DC | PRN
  Administered 2017-12-13 – 2017-12-17 (×6): 10 mL via ORAL
  Filled 2017-12-13 (×6): qty 10

## 2017-12-13 MED ORDER — ONDANSETRON HCL 4 MG/2ML IJ SOLN
4.0000 mg | Freq: Four times a day (QID) | INTRAMUSCULAR | Status: DC | PRN
  Administered 2017-12-13: 4 mg via INTRAVENOUS
  Filled 2017-12-13: qty 2

## 2017-12-13 MED ORDER — SODIUM CHLORIDE 0.9% FLUSH: 3.0000 mL | INTRAVENOUS | Status: DC | PRN

## 2017-12-13 MED ORDER — POLYETHYL GLYCOL-PROPYL GLYCOL 0.4-0.3 % OP SOLN: 1.0000 [drp] | Freq: Every day | OPHTHALMIC | Status: DC | PRN

## 2017-12-13 MED ORDER — SODIUM CHLORIDE 0.9 % IV SOLN: 250.0000 mL | INTRAVENOUS | Status: DC | PRN

## 2017-12-13 MED ORDER — TORSEMIDE 20 MG PO TABS: 40.0000 mg | ORAL_TABLET | Freq: Two times a day (BID) | ORAL | Status: DC

## 2017-12-13 MED ORDER — ADULT MULTIVITAMIN W/MINERALS CH
1.0000 | ORAL_TABLET | Freq: Every day | ORAL | Status: DC
  Administered 2017-12-13 – 2017-12-19 (×6): 1 via ORAL
  Filled 2017-12-13 (×6): qty 1

## 2017-12-13 MED ORDER — METHYLPREDNISOLONE SODIUM SUCC 40 MG IJ SOLR
40.0000 mg | Freq: Two times a day (BID) | INTRAMUSCULAR | Status: DC
  Administered 2017-12-13 – 2017-12-15 (×4): 40 mg via INTRAVENOUS
  Filled 2017-12-13 (×4): qty 1

## 2017-12-13 MED ORDER — HEPARIN SODIUM (PORCINE) 5000 UNIT/ML IJ SOLN
5000.0000 [IU] | Freq: Three times a day (TID) | INTRAMUSCULAR | Status: DC
  Administered 2017-12-13 – 2017-12-19 (×20): 5000 [IU] via SUBCUTANEOUS
  Filled 2017-12-13 (×19): qty 1

## 2017-12-13 MED ORDER — UMECLIDINIUM BROMIDE 62.5 MCG/INH IN AEPB
1.0000 | INHALATION_SPRAY | Freq: Every day | RESPIRATORY_TRACT | Status: DC
  Administered 2017-12-13 – 2017-12-19 (×6): 1 via RESPIRATORY_TRACT
  Filled 2017-12-13 (×2): qty 7

## 2017-12-13 MED ORDER — LEVOTHYROXINE SODIUM 50 MCG PO TABS
250.0000 ug | ORAL_TABLET | Freq: Every day | ORAL | Status: DC
  Administered 2017-12-14 – 2017-12-19 (×6): 250 ug via ORAL
  Filled 2017-12-13 (×2): qty 2
  Filled 2017-12-13 (×5): qty 1

## 2017-12-13 MED ORDER — FLUTICASONE FUROATE-VILANTEROL 100-25 MCG/INH IN AEPB
1.0000 | INHALATION_SPRAY | Freq: Every day | RESPIRATORY_TRACT | Status: DC
  Administered 2017-12-13 – 2017-12-19 (×6): 1 via RESPIRATORY_TRACT
  Filled 2017-12-13 (×2): qty 28

## 2017-12-13 MED ORDER — TORSEMIDE 20 MG PO TABS
20.0000 mg | ORAL_TABLET | Freq: Two times a day (BID) | ORAL | Status: DC
Start: 2017-12-13 — End: 2017-12-14
  Administered 2017-12-13 – 2017-12-14 (×2): 20 mg via ORAL
  Filled 2017-12-13 (×2): qty 1
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
## 2017-12-13 NOTE — ED Notes (Signed)
Unable to obtain a second set of cultures.

## 2017-12-13 NOTE — ED Notes (Signed)
ED TO INPATIENT HANDOFF REPORT  Name/Age/Gender Amanda Cain 72 y.o. female  Code Status    Code Status Orders  (From admission, onward)        Start     Ordered   12/13/17 0033  Full code  Continuous     12/13/17 0033    Code Status History    Date Active Date Inactive Code Status Order ID Comments User Context   07/05/2017 2108 07/08/2017 1526 Full Code [id number]  Etta Quill, DO ED   11/17/2016 0656 11/20/2016 1544 Full Code [id number]  Edwin Dada, MD Inpatient   03/02/2015 1247 03/03/2015 1434 Full Code [id number]  Ralene Ok, MD Inpatient   10/31/2012 1237 11/02/2012 1906 Full Code [id number]  Penelope Coop Inpatient   11/19/2011 2057 11/30/2011 1650 Full Code [id number]  Parke Simmers, RN ED   07/15/2011 1322 07/19/2011 1429 Full Code [id number]  Hubert Azure, RN Inpatient      Home/SNF/Other Home  Chief Complaint resp distress  Level of Care/Admitting Diagnosis ED Disposition    ED Disposition Condition Comment   Admit  Hospital Area: [facility] [[id number]]  Level of Care: Telemetry [5]  Admit to tele based on following criteria: Monitor for Ischemic changes  Diagnosis: CAP (community acquired pneumonia) [300923]  Admitting Physician: Jani Gravel [[id number]]  Attending Physician: Jani Gravel [phone]  Estimated length of stay: past midnight tomorrow  Certification:: I certify this patient will need inpatient services for at least 2 midnights  PT Class (Do Not Modify): Inpatient [101]  PT Acc Code (Do Not Modify): Private [1]       Medical History Past Medical History:  Diagnosis Date  . Anemia   . Anxiety    severe  . Arthritis   . Bronchitis    hx of  . CHF (congestive heart failure) ([place])   . Chronic kidney disease    "kidney disease stage 3"  . Depression   . Diabetes mellitus   . GERD (gastroesophageal reflux disease)   . Hx of gallstones   . Hypercholesteremia   . Hypertension   . Hypothyroidism      Allergies Allergies  Allergen Reactions  . Aspirin     " Have an ulcer"    IV Location/Drains/Wounds Patient Lines/Drains/Airways Status   Active Line/Drains/Airways    Name:   Placement date:   Placement time:   Site:   Days:   Peripheral IV 12/12/17 Left Antecubital   12/12/17    1700    Antecubital   1   External Urinary Catheter   12/12/17    2300    -   1   Incision (Closed) 07/21/16 Neck Right   07/21/16    0802     510   Incision - 1 Port Abdomen 1: Right;Superior;Upper;Umbilicus   62/26/33    3545     1017   Incision - 3 Ports Abdomen 1: Umbilicus 2: Anterior;Mid;Umbilicus 3: Right;Anterior;Upper;Umbilicus   62/56/38    9373     1017          Labs/Imaging Results for orders placed or performed during the hospital encounter of 12/12/17 (from the past 48 hour(s))  Brain natriuretic peptide     Status: Abnormal   Collection Time: 12/12/17  5:26 PM  Result Value Ref Range   B Natriuretic Peptide 212.5 (H) 0.0 - 100.0 pg/mL    Comment: Performed at [facility], [address] [place], [place]  [address]  Troponin I     Status: None   Collection Time: 12/12/17  6:07 PM  Result Value Ref Range   Troponin I <0.03 <0.03 ng/mL    Comment: Performed at [facility], [place] [address] [place], [place] [address]  Basic metabolic panel     Status: Abnormal   Collection Time: 12/12/17  6:13 PM  Result Value Ref Range   Sodium 132 (L) 135 - 145 mmol/L   Potassium 4.3 3.5 - 5.1 mmol/L   Chloride 93 (L) 98 - 111 mmol/L    Comment: Please note change in reference range.   CO2 28 22 - 32 mmol/L   Glucose, Bld 194 (H) 70 - 99 mg/dL    Comment: Please note change in reference range.   BUN 30 (H) 8 - 23 mg/dL    Comment: Please note change in reference range.   Creatinine, Ser 2.21 (H) 0.44 - 1.00 mg/dL   Calcium 9.1 8.9 - 10.3 mg/dL   GFR calc non Af Amer 21 (L) >60 mL/min   GFR calc Af Amer 24 (L) >60 mL/min    Comment: (NOTE) The eGFR  has been calculated using the CKD EPI equation. This calculation has not been validated in all clinical situations. eGFR's persistently <60 mL/min signify possible Chronic Kidney Disease.    Anion gap 11 5 - 15    Comment: Performed at [facility], [place] [address], [place], [place] [address]  CBC with Differential     Status: Abnormal   Collection Time: 12/12/17  6:13 PM  Result Value Ref Range   WBC 11.4 (H) 4.0 - 10.5 K/uL   RBC 3.59 (L) 3.87 - 5.11 MIL/uL   Hemoglobin 9.9 (L) 12.0 - 15.0 g/dL   HCT 31.2 (L) 36.0 - 46.0 %   MCV 86.9 78.0 - 100.0 fL   MCH 27.6 26.0 - 34.0 pg   MCHC 31.7 30.0 - 36.0 g/dL   RDW 15.8 (H) 11.5 - 15.5 %   Platelets 338 150 - 400 K/uL   Neutrophils Relative % 83 %   Neutro Abs 9.5 (H) 1.7 - 7.7 K/uL   Lymphocytes Relative 10 %   Lymphs Abs 1.2 0.7 - 4.0 K/uL   Monocytes Relative 6 %   Monocytes Absolute 0.6 0.1 - 1.0 K/uL   Eosinophils Relative 1 %   Eosinophils Absolute 0.1 0.0 - 0.7 K/uL   Basophils Relative 0 %   Basophils Absolute 0.0 0.0 - 0.1 K/uL    Comment: Performed at [facility], [place] [address] [place], [place] [address]  CBG monitoring, ED     Status: None   Collection Time: 12/13/17  9:19 AM  Result Value Ref Range   Glucose-Capillary 87 70 - 99 mg/dL   Dg Chest 2 View  Result Date: 12/12/2017 CLINICAL DATA:  Patient is shortness of breath starting approx. 30 min ago. Hx asthma, CHF, HTN, diabetes, no other chest complaints EXAM: CHEST - 2 VIEW COMPARISON:  11/20/2017 FINDINGS: Low lung volumes are present, causing crowding of the pulmonary vasculature. Indistinct pulmonary vasculature with hazy opacities at the lung bases. Reverse lordotic projection may be exacerbating the basilar findings. Upper normal heart size. No pleural effusion. Thoracic spondylosis. IMPRESSION: 1. Indistinct opacities at the lung bases suggesting atelectasis or less likely pneumonia or aspiration. Much of this appearance  might be attributable to the reverse lordotic projection and low lung volumes causing vascular crowding. 2. Upper normal heart size. Electronically  Signed   By: Van Clines M.D.   On: 12/12/2017 19:03    Pending Labs Unresulted Labs (From admission, onward)   Start     Ordered   12/14/17 9811  Basic metabolic panel  Daily,   R     12/13/17 1028   12/13/17 0034  Legionella Pneumophila Serogp 1 Ur Ag  Once,   R     12/13/17 0033   12/13/17 0033  Respiratory Panel by PCR  (Respiratory virus panel)  Once,   R    Question:  Patient immune status  Answer:  Normal   12/13/17 0033   12/13/17 0032  Culture, blood (routine x 2) Call MD if unable to obtain prior to antibiotics being given  BLOOD CULTURE X 2,   R    Comments:  If blood cultures drawn in Emergency Department - Do not draw and cancel order   Question:  Patient immune status  Answer:  Normal   12/13/17 0033   12/13/17 0032  Culture, sputum-assessment  Once,   R    Question:  Patient immune status  Answer:  Normal   12/13/17 0033   12/13/17 0032  Gram stain  Once,   R    Question:  Patient immune status  Answer:  Normal   12/13/17 0033   12/13/17 0032  HIV antibody (Routine Screening)  Once,   R     12/13/17 0033   12/13/17 0032  Strep pneumoniae urinary antigen  Once,   R     12/13/17 0033      Vitals/Pain Today's Vitals   12/13/17 1007 12/13/17 1243 12/13/17 1413 12/13/17 1414  BP: (!) 147/63 (!) 147/69  (!) 167/79  Pulse: 69 73  81  Resp: (!) 23 (!) 21  20  Temp:      TempSrc:      SpO2: 100% 100% 92% 95%  Weight:      Height:      PainSc:        Isolation Precautions Droplet precaution  Medications Medications  albuterol (PROVENTIL) (2.5 MG/3ML) 0.083% nebulizer solution 2.5 mg (has no administration in time range)  atorvastatin (LIPITOR) tablet 80 mg (has no administration in time range)  diltiazem (CARDIZEM CD) 24 hr capsule 360 mg (has no administration in time range)  escitalopram (LEXAPRO) tablet  20 mg (has no administration in time range)  estradiol (ESTRACE) tablet 0.5 mg (has no administration in time range)  ezetimibe (ZETIA) tablet 10 mg (has no administration in time range)  ferrous sulfate tablet 325 mg (has no administration in time range)  fluticasone (FLONASE) 50 MCG/ACT nasal spray 1 spray (has no administration in time range)  glimepiride (AMARYL) tablet 1 mg (has no administration in time range)  guaiFENesin-dextromethorphan (ROBITUSSIN DM) 100-10 MG/5ML syrup 10 mL (has no administration in time range)  levothyroxine (SYNTHROID, LEVOTHROID) tablet 250 mcg (has no administration in time range)  LORazepam (ATIVAN) tablet 0.5 mg (has no administration in time range)  metoprolol succinate (TOPROL-XL) 24 hr tablet 100 mg (has no administration in time range)  montelukast (SINGULAIR) tablet 10 mg (has no administration in time range)  multivitamin with minerals tablet 1 tablet (1 tablet Oral Given 12/13/17 0911)  paliperidone (INVEGA) 24 hr tablet 3 mg (has no administration in time range)  pantoprazole (PROTONIX) EC tablet 40 mg (40 mg Oral Given 12/13/17 0911)  olopatadine (PATANOL) 0.1 % ophthalmic solution 1 drop (has no administration in time range)  potassium chloride SA (K-DUR,KLOR-CON) CR tablet  20 mEq (20 mEq Oral Given 12/13/17 0911)  saccharomyces boulardii (FLORASTOR) capsule 250 mg (has no administration in time range)  linagliptin (TRADJENTA) tablet 5 mg (has no administration in time range)  sucralfate (CARAFATE) tablet 1 g (1 g Oral Given 12/13/17 0911)  heparin injection 5,000 Units (5,000 Units Subcutaneous Given 12/13/17 0623)  sodium chloride flush (NS) 0.9 % injection 3 mL (3 mLs Intravenous Given 12/13/17 0952)  sodium chloride flush (NS) 0.9 % injection 3 mL (has no administration in time range)  0.9 %  sodium chloride infusion (has no administration in time range)  cefTRIAXone (ROCEPHIN) 1 g in sodium chloride 0.9 % 100 mL IVPB (0 g Intravenous Stopped 12/13/17  0216)  azithromycin (ZITHROMAX) 500 mg in sodium chloride 0.9 % 250 mL IVPB (0 mg Intravenous Stopped 12/13/17 0324)  ondansetron (ZOFRAN) injection 4 mg (4 mg Intravenous Given 12/13/17 0319)  torsemide (DEMADEX) tablet 20 mg (has no administration in time range)  methylPREDNISolone sodium succinate (SOLU-MEDROL) 40 mg/mL injection 40 mg (has no administration in time range)  polyvinyl alcohol (LIQUIFILM TEARS) 1.4 % ophthalmic solution 1 drop (has no administration in time range)  fluticasone furoate-vilanterol (BREO ELLIPTA) 100-25 MCG/INH 1 puff (1 puff Inhalation Given 12/13/17 1409)    And  umeclidinium bromide (INCRUSE ELLIPTA) 62.5 MCG/INH 1 puff (1 puff Inhalation Given 12/13/17 1409)  lip balm (BLISTEX) ointment (has no administration in time range)  albuterol (PROVENTIL) (2.5 MG/3ML) 0.083% nebulizer solution 5 mg (5 mg Nebulization Given 12/12/17 2037)  furosemide (LASIX) injection 80 mg (80 mg Intravenous Given 12/12/17 2037)    Mobility walks with person assist

## 2017-12-13 NOTE — Progress Notes (Signed)
PROGRESS NOTE    Amanda Cain  HKV:[id number] DOB: [birth year] DOA: 12/12/2017 PCP: Mayra Neer, MD  Brief Narrative  72 y.o. female, w  Hypothyroidism, hypertension, hyperlipidemia, dm2, ckd stage 3, CHF (EF 55-60%), apparently c/o cough w green sputum for the past 1 week. + chills, increase in dyspnea today and therefore presented to ED for evaluation.   In Ed,  CXR  IMPRESSION: 1. Indistinct opacities at the lung bases suggesting atelectasis or less likely pneumonia or aspiration. Much of this appearance might be attributable to the reverse lordotic projection and low lung volumes causing vascular crowding. 2. Upper normal heart size.  Na 132, K 4.3,  Bun 30, Creatinine 2.21  Trop <0.03  Wbc 11.4, Hgb 9.9, Plt 338  Pt will be admitted for CAP, ARF on CRF.      Assessment & Plan:   Principal Problem:   Community acquired pneumonia Active Problems:   Anemia   Type 2 diabetes mellitus with complication, without long-term current use of insulin (HCC)   CKD (chronic kidney disease), stage III (HCC)   CHF (congestive heart failure) (HCC)   CAP (community acquired pneumonia)  1]CAP-patient presented with complaints of increasing dyspnea and cough with green phlegm.  No documented fever.  Chest x-ray showed possible atelectasis versus pneumonia.  She has been started on Rocephin and azithromycin.  Add nebulizer treatments. Follow-up blood culture   2] diastolic CHF patient received a dose of Lasix in the ED along with antibiotics and nebulizers.  She feels that she her symptoms got better but BNP was only mildly elevated to 12 in the presence of acute on chronic kidney disease.  Continue home dose of torsemide.  Her chest x-ray did not reveal any evidence of fluid overload this time.  3] patient has history of asthma continue home medications including Singulair and Trelegy.  taper her steroids.    4]htn continue current medications.  5]dm continue home  medications.  6]aki on ckd stage 3 monitor on diuretics.  DVT prophylaxis: scd Code Status:full Family Communication dw sisters   Disposition Plan: Patient lives at home alone. Consultants:  chmg Procedures none Antimicrobials: rocephin ad azithro Subjective: Feels better than yesterday Objective: Vitals:   12/13/17 0630 12/13/17 0703 12/13/17 0800 12/13/17 1007  BP: (!) 149/63 (!) 148/74 (!) 179/64 (!) 147/63  Pulse: 65 67 70 69  Resp: (!) 26 14 (!) 21 (!) 23  Temp:   99.1 F (37.3 C)   TempSrc:   Oral   SpO2: 100% 100% 100% 100%  Weight:      Height:        Intake/Output Summary (Last 24 hours) at 12/13/2017 1009 Last data filed at 12/13/2017 0324 Gross per 24 hour  Intake 350 ml  Output 850 ml  Net -500 ml   Filed Weights   12/12/17 1711  Weight: 103.4 kg (228 lb)    Examination:  General exam: Appears calm and comfortable  Respiratory system: scattered rhonchi to auscultation. Respiratory effort normal. Cardiovascular system: S1 & S2 heard, RRR. No JVD, murmurs, rubs, gallops or clicks. No pedal edema. Gastrointestinal system: Abdomen is nondistended, soft and nontender. No organomegaly or masses felt. Normal bowel sounds heard. Central nervous system: Alert and oriented. No focal neurological deficits. Extremities: Symmetric 5 x 5 power. Skin: No rashes, lesions or ulcers Psychiatry: Judgement and insight appear normal. Mood & affect appropriate.     Data Reviewed: I have personally reviewed following labs and imaging studies  CBC: Recent Labs  Lab 12/12/17 1813  WBC 11.4*  NEUTROABS 9.5*  HGB 9.9*  HCT 31.2*  MCV 86.9  PLT 161   Basic Metabolic Panel: Recent Labs  Lab 12/12/17 1813  NA 132*  K 4.3  CL 93*  CO2 28  GLUCOSE 194*  BUN 30*  CREATININE 2.21*  CALCIUM 9.1   GFR: Estimated Creatinine Clearance: 23.7 mL/min (A) (by C-G formula based on SCr of 2.21 mg/dL (H)). Liver Function Tests: No results for input(s): AST, ALT,  ALKPHOS, BILITOT, PROT, ALBUMIN in the last 168 hours. No results for input(s): LIPASE, AMYLASE in the last 168 hours. No results for input(s): AMMONIA in the last 168 hours. Coagulation Profile: No results for input(s): INR, PROTIME in the last 168 hours. Cardiac Enzymes: Recent Labs  Lab 12/12/17 1807  TROPONINI <0.03   BNP (last 3 results) Recent Labs    01/31/17 0933 11/14/17 0812  PROBNP 11 557*   HbA1C: No results for input(s): HGBA1C in the last 72 hours. CBG: Recent Labs  Lab 12/13/17 0919  GLUCAP 87   Lipid Profile: No results for input(s): CHOL, HDL, LDLCALC, TRIG, CHOLHDL, LDLDIRECT in the last 72 hours. Thyroid Function Tests: No results for input(s): TSH, T4TOTAL, FREET4, T3FREE, THYROIDAB in the last 72 hours. Anemia Panel: No results for input(s): VITAMINB12, FOLATE, FERRITIN, TIBC, IRON, RETICCTPCT in the last 72 hours. Sepsis Labs: No results for input(s): PROCALCITON, LATICACIDVEN in the last 168 hours.  No results found for this or any previous visit (from the past 240 hour(s)).       Radiology Studies: Dg Chest 2 View  Result Date: 12/12/2017 CLINICAL DATA:  Patient is shortness of breath starting approx. 30 min ago. Hx asthma, CHF, HTN, diabetes, no other chest complaints EXAM: CHEST - 2 VIEW COMPARISON:  11/20/2017 FINDINGS: Low lung volumes are present, causing crowding of the pulmonary vasculature. Indistinct pulmonary vasculature with hazy opacities at the lung bases. Reverse lordotic projection may be exacerbating the basilar findings. Upper normal heart size. No pleural effusion. Thoracic spondylosis. IMPRESSION: 1. Indistinct opacities at the lung bases suggesting atelectasis or less likely pneumonia or aspiration. Much of this appearance might be attributable to the reverse lordotic projection and low lung volumes causing vascular crowding. 2. Upper normal heart size. Electronically Signed   By: Van Clines M.D.   On: 12/12/2017 19:03         Scheduled Meds: . atorvastatin  80 mg Oral Daily  . diltiazem  360 mg Oral Daily  . escitalopram  20 mg Oral QHS  . estradiol  0.5 mg Oral q morning - 10a  . ezetimibe  10 mg Oral Daily  . ferrous sulfate  325 mg Oral BID WC  . fluticasone  1 spray Each Nare Daily  . Fluticasone-Umeclidin-Vilant  1 Inhaler Inhalation Daily  . glimepiride  1 mg Oral Q breakfast  . heparin  5,000 Units Subcutaneous Q8H  . levothyroxine  250 mcg Oral QAC breakfast  . linagliptin  5 mg Oral Daily  . LORazepam  0.5 mg Oral QHS  . methylPREDNISolone (SOLU-MEDROL) injection  80 mg Intravenous Q8H  . metoprolol succinate  100 mg Oral q morning - 10a  . montelukast  10 mg Oral QHS  . multivitamin with minerals  1 tablet Oral Daily  . Olopatadine HCl  1 drop Both Eyes Daily  . paliperidone  3 mg Oral QHS  . pantoprazole  40 mg Oral Daily  . potassium chloride SA  20 mEq Oral BID  .  saccharomyces boulardii  250 mg Oral BID  . sodium chloride flush  3 mL Intravenous Q12H  . sucralfate  1 g Oral BID  . torsemide  20 mg Oral BID   Continuous Infusions: . sodium chloride    . azithromycin Stopped (12/13/17 0324)  . cefTRIAXone (ROCEPHIN)  IV Stopped (12/13/17 0216)     LOS: 1 day     Georgette Shell, MD [facility] If 7PM-7AM, please contact night-coverage [email or web address] Password [email or web address] 12/13/2017, 10:09 AM
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
## 2017-12-14 LAB — BLOOD CULTURE ID PANEL (REFLEXED)

## 2017-12-14 LAB — BASIC METABOLIC PANEL
Anion gap: 11 (ref 5–15)
BUN: 45 mg/dL — ABNORMAL HIGH (ref 8–23)
CO2: 28 mmol/L (ref 22–32)
Calcium: 9.2 mg/dL (ref 8.9–10.3)
Chloride: 95 mmol/L — ABNORMAL LOW (ref 98–111)
Creatinine, Ser: 2.24 mg/dL — ABNORMAL HIGH (ref 0.44–1.00)
GFR calc Af Amer: 24 mL/min — ABNORMAL LOW (ref >60)
GFR calc non Af Amer: 20 mL/min — ABNORMAL LOW (ref >60)
Glucose, Bld: 194 mg/dL — ABNORMAL HIGH (ref 70–99)
Potassium: 5.2 mmol/L — ABNORMAL HIGH (ref 3.5–5.1)
Sodium: 134 mmol/L — ABNORMAL LOW (ref 135–145)

## 2017-12-14 LAB — RESPIRATORY PANEL BY PCR

## 2017-12-14 LAB — GLUCOSE, CAPILLARY
Glucose-Capillary: 246 mg/dL — ABNORMAL HIGH (ref 70–99)
Glucose-Capillary: 271 mg/dL — ABNORMAL HIGH (ref 70–99)
Glucose-Capillary: 255 mg/dL — ABNORMAL HIGH (ref 70–99)

## 2017-12-14 MED ORDER — SODIUM CHLORIDE 0.9 % IV SOLN: 250.0000 mL | INTRAVENOUS | Status: DC | PRN

## 2017-12-14 MED ORDER — AZITHROMYCIN 500 MG PO TABS
500.0000 mg | ORAL_TABLET | Freq: Every day | ORAL | Status: DC
  Administered 2017-12-14 – 2017-12-17 (×4): 500 mg via ORAL
  Filled 2017-12-14 (×2): qty 1
  Filled 2017-12-14: qty 2
  Filled 2017-12-14 (×2): qty 1

## 2017-12-14 MED ORDER — SODIUM CHLORIDE 0.9 % IV SOLN
INTRAVENOUS | Status: DC
  Administered 2017-12-15: 06:00:00 via INTRAVENOUS

## 2017-12-14 MED ORDER — POTASSIUM CHLORIDE CRYS ER 20 MEQ PO TBCR: 20.0000 meq | EXTENDED_RELEASE_TABLET | Freq: Two times a day (BID) | ORAL | Status: DC

## 2017-12-14 MED ORDER — SODIUM CHLORIDE 0.9% FLUSH
3.0000 mL | Freq: Two times a day (BID) | INTRAVENOUS | Status: DC
  Administered 2017-12-14: 3 mL via INTRAVENOUS

## 2017-12-14 MED ORDER — LORAZEPAM 0.5 MG PO TABS
0.5000 mg | ORAL_TABLET | Freq: Two times a day (BID) | ORAL | Status: DC
  Administered 2017-12-14 – 2017-12-19 (×9): 0.5 mg via ORAL
  Filled 2017-12-14 (×9): qty 1

## 2017-12-14 MED ORDER — SODIUM CHLORIDE 0.9% FLUSH: 3.0000 mL | INTRAVENOUS | Status: DC | PRN

## 2017-12-14 NOTE — Progress Notes (Addendum)
Progress Note  Patient Name: Amanda Cain Date of Encounter: 12/14/2017  Primary Cardiologist: Larae Grooms, MD   Subjective   Feeling better but no improvement in breathing. No chest pain.   Inpatient Medications    Scheduled Meds: . atorvastatin  80 mg Oral Daily  . azithromycin  500 mg Oral QHS  . diltiazem  360 mg Oral Daily  . escitalopram  20 mg Oral QHS  . estradiol  0.5 mg Oral q morning - 10a  . ezetimibe  10 mg Oral Daily  . ferrous sulfate  325 mg Oral BID WC  . fluticasone  1 spray Each Nare Daily  . fluticasone furoate-vilanterol  1 puff Inhalation Daily   And  . umeclidinium bromide  1 puff Inhalation Daily  . glimepiride  1 mg Oral Q breakfast  . heparin  5,000 Units Subcutaneous Q8H  . levothyroxine  250 mcg Oral QAC breakfast  . linagliptin  5 mg Oral Daily  . LORazepam  0.5 mg Oral QHS  . methylPREDNISolone (SOLU-MEDROL) injection  40 mg Intravenous Q12H  . metoprolol succinate  100 mg Oral q morning - 10a  . montelukast  10 mg Oral QHS  . multivitamin with minerals  1 tablet Oral Daily  . olopatadine  1 drop Both Eyes Daily  . paliperidone  3 mg Oral QHS  . pantoprazole  40 mg Oral Daily  . [START ON 12/15/2017] potassium chloride SA  20 mEq Oral BID  . saccharomyces boulardii  250 mg Oral BID  . sodium chloride flush  3 mL Intravenous Q12H  . sucralfate  1 g Oral BID  . torsemide  20 mg Oral BID   Continuous Infusions: . sodium chloride    . cefTRIAXone (ROCEPHIN)  IV Stopped (12/14/17 0130)   PRN Meds: sodium chloride, albuterol, guaiFENesin-dextromethorphan, lip balm, ondansetron, polyvinyl alcohol, sodium chloride flush   Vital Signs    Vitals:   12/13/17 1453 12/13/17 2004 12/14/17 0615 12/14/17 0743  BP:  (!) 173/71 (!) 156/69   Pulse:  85 65   Resp: 20 (!) 24 (!) 24   Temp: 98.9 F (37.2 C) 98.2 F (36.8 C) 97.9 F (36.6 C)   TempSrc: Oral     SpO2: 92% 100% 100% 98%  Weight: 227 lb 15.3 oz (103.4 kg)     Height: 4'  11" (1.499 m)       Intake/Output Summary (Last 24 hours) at 12/14/2017 1026 Last data filed at 12/14/2017 0735 Gross per 24 hour  Intake -  Output 6 ml  Net -6 ml   Filed Weights   12/12/17 1711 12/13/17 1453  Weight: 228 lb (103.4 kg) 227 lb 15.3 oz (103.4 kg)    Telemetry    SR at controlled rate  - Personally Reviewed  ECG    N/A  Physical Exam   GEN: Obese female in no acute distress.   Neck: ? + JVD (difficult to assess due to grith) Cardiac: RRR, no murmurs, rubs, or gallops.  Respiratory: Clear to auscultation bilaterally. GI: Soft, nontender, distended  MS: trace bilateral edema; No deformity. Neuro:  Nonfocal  Psych: Normal affect   Labs    Chemistry Recent Labs  Lab 12/12/17 1813 12/14/17 0525  NA 132* 134*  K 4.3 5.2*  CL 93* 95*  CO2 28 28  GLUCOSE 194* 194*  BUN 30* 45*  CREATININE 2.21* 2.24*  CALCIUM 9.1 9.2  GFRNONAA 21* 20*  GFRAA 24* 24*  ANIONGAP 11 11  Hematology Recent Labs  Lab 12/12/17 1813  WBC 11.4*  RBC 3.59*  HGB 9.9*  HCT 31.2*  MCV 86.9  MCH 27.6  MCHC 31.7  RDW 15.8*  PLT 338    Cardiac Enzymes Recent Labs  Lab 12/12/17 1807  TROPONINI <0.03   BNP Recent Labs  Lab 12/12/17 1726  BNP 212.5*      Radiology    Dg Chest 2 View  Result Date: 12/12/2017 CLINICAL DATA:  Patient is shortness of breath starting approx. 30 min ago. Hx asthma, CHF, HTN, diabetes, no other chest complaints EXAM: CHEST - 2 VIEW COMPARISON:  11/20/2017 FINDINGS: Low lung volumes are present, causing crowding of the pulmonary vasculature. Indistinct pulmonary vasculature with hazy opacities at the lung bases. Reverse lordotic projection may be exacerbating the basilar findings. Upper normal heart size. No pleural effusion. Thoracic spondylosis. IMPRESSION: 1. Indistinct opacities at the lung bases suggesting atelectasis or less likely pneumonia or aspiration. Much of this appearance might be attributable to the reverse lordotic  projection and low lung volumes causing vascular crowding. 2. Upper normal heart size. Electronically Signed   By: Van Clines M.D.   On: 12/12/2017 19:03    Cardiac Studies   Echo 10/2017 Study Conclusions  - Left ventricle: The cavity size was normal. Wall thickness was increased in a pattern of mild LVH. Systolic function was normal. The estimated ejection fraction was in the range of 55% to 60%. Wall motion was normal; there were no regional wall motion abnormalities. Features are consistent with a pseudonormal left ventricular filling pattern, with concomitant abnormal relaxation and increased filling pressure (grade 2 diastolic dysfunction). - Mitral valve: Mildly calcified annulus. Valve area by pressure half-time: 2 cm^2. Valve area by continuity equation (using LVOT flow): 2.95 cm^2. - Left atrium: The atrium was mildly dilated.   Patient Profile     Amanda Cain is a 72 y.o. female with a hx of hypertension, diabetes, chronic diastolic heart failure, chronic kidney disease and asthma  presented for evaluation of shortness of breath. Admitted for pneumonia, asthma exacerbation and mild acute CHF.   Assessment & Plan    1. Acute on chronic diastolic CHF - BNP minimally elevated 212 at presentation. Weight has been stable with symptoms.  Got Iv lasix x 1 and now back on home dose of torsemide 20mg  BID. Net I  & O - 500cc. Scr minimally increase further to 2.24. Dr. Irish Lack discussed [place] if no improvement. Discussed with Dr. Harrington Challenger. Will plan RHC tomorrow. Hold Torsemide and follow renal function.   2. Acute on chronic kidney disease stage III-IV - Baseline creatinine around 1.6. Scr of 2.21 >>>2.24.   3. CAP with positive blood culture (??  Contamination)  - Per primary. On Abx.   4. Hyperkalemia - hold Kdur.   For questions or updates, please contact [place] Please consult [email or web address] for contact info under Cardiology/STEMI.     Patient seen and examined   Agree with findings as noted above by B Bhagat   Pt remains SOB   Exam difficlut  Cr increased today ON exam:   Neck Full  Lungs Rel clear  Cardiac RRR   No S 3 or signif murmurs  Ext with Tr edema  Volume status is difficult    Pt is still SOB   Cr rising I agree with R heart cath to define pressures   Plan on for tomorrow.  [address]     Signed, [place], [place]  12/14/2017,  10:26 AM

## 2017-12-14 NOTE — H&P (View-Only) (Signed)
Progress Note  Patient Name: Amanda Cain Date of Encounter: 12/14/2017  Primary Cardiologist: Larae Grooms, MD   Subjective   Feeling better but no improvement in breathing. No chest pain.   Inpatient Medications    Scheduled Meds: . atorvastatin  80 mg Oral Daily  . azithromycin  500 mg Oral QHS  . diltiazem  360 mg Oral Daily  . escitalopram  20 mg Oral QHS  . estradiol  0.5 mg Oral q morning - 10a  . ezetimibe  10 mg Oral Daily  . ferrous sulfate  325 mg Oral BID WC  . fluticasone  1 spray Each Nare Daily  . fluticasone furoate-vilanterol  1 puff Inhalation Daily   And  . umeclidinium bromide  1 puff Inhalation Daily  . glimepiride  1 mg Oral Q breakfast  . heparin  5,000 Units Subcutaneous Q8H  . levothyroxine  250 mcg Oral QAC breakfast  . linagliptin  5 mg Oral Daily  . LORazepam  0.5 mg Oral QHS  . methylPREDNISolone (SOLU-MEDROL) injection  40 mg Intravenous Q12H  . metoprolol succinate  100 mg Oral q morning - 10a  . montelukast  10 mg Oral QHS  . multivitamin with minerals  1 tablet Oral Daily  . olopatadine  1 drop Both Eyes Daily  . paliperidone  3 mg Oral QHS  . pantoprazole  40 mg Oral Daily  . [START ON 12/15/2017] potassium chloride SA  20 mEq Oral BID  . saccharomyces boulardii  250 mg Oral BID  . sodium chloride flush  3 mL Intravenous Q12H  . sucralfate  1 g Oral BID  . torsemide  20 mg Oral BID   Continuous Infusions: . sodium chloride    . cefTRIAXone (ROCEPHIN)  IV Stopped (12/14/17 0130)   PRN Meds: sodium chloride, albuterol, guaiFENesin-dextromethorphan, lip balm, ondansetron, polyvinyl alcohol, sodium chloride flush   Vital Signs    Vitals:   12/13/17 1453 12/13/17 2004 12/14/17 0615 12/14/17 0743  BP:  (!) 173/71 (!) 156/69   Pulse:  85 65   Resp: 20 (!) 24 (!) 24   Temp: 98.9 F (37.2 C) 98.2 F (36.8 C) 97.9 F (36.6 C)   TempSrc: Oral     SpO2: 92% 100% 100% 98%  Weight: 227 lb 15.3 oz (103.4 kg)     Height: 4'  11" (1.499 m)       Intake/Output Summary (Last 24 hours) at 12/14/2017 1026 Last data filed at 12/14/2017 0735 Gross per 24 hour  Intake -  Output 6 ml  Net -6 ml   Filed Weights   12/12/17 1711 12/13/17 1453  Weight: 228 lb (103.4 kg) 227 lb 15.3 oz (103.4 kg)    Telemetry    SR at controlled rate  - Personally Reviewed  ECG    N/A  Physical Exam   GEN: Obese female in no acute distress.   Neck: ? + JVD (difficult to assess due to grith) Cardiac: RRR, no murmurs, rubs, or gallops.  Respiratory: Clear to auscultation bilaterally. GI: Soft, nontender, distended  MS: trace bilateral edema; No deformity. Neuro:  Nonfocal  Psych: Normal affect   Labs    Chemistry Recent Labs  Lab 12/12/17 1813 12/14/17 0525  NA 132* 134*  K 4.3 5.2*  CL 93* 95*  CO2 28 28  GLUCOSE 194* 194*  BUN 30* 45*  CREATININE 2.21* 2.24*  CALCIUM 9.1 9.2  GFRNONAA 21* 20*  GFRAA 24* 24*  ANIONGAP 11 11  Hematology Recent Labs  Lab 12/12/17 1813  WBC 11.4*  RBC 3.59*  HGB 9.9*  HCT 31.2*  MCV 86.9  MCH 27.6  MCHC 31.7  RDW 15.8*  PLT 338    Cardiac Enzymes Recent Labs  Lab 12/12/17 1807  TROPONINI <0.03   BNP Recent Labs  Lab 12/12/17 1726  BNP 212.5*      Radiology    Dg Chest 2 View  Result Date: 12/12/2017 CLINICAL DATA:  Patient is shortness of breath starting approx. 30 min ago. Hx asthma, CHF, HTN, diabetes, no other chest complaints EXAM: CHEST - 2 VIEW COMPARISON:  11/20/2017 FINDINGS: Low lung volumes are present, causing crowding of the pulmonary vasculature. Indistinct pulmonary vasculature with hazy opacities at the lung bases. Reverse lordotic projection may be exacerbating the basilar findings. Upper normal heart size. No pleural effusion. Thoracic spondylosis. IMPRESSION: 1. Indistinct opacities at the lung bases suggesting atelectasis or less likely pneumonia or aspiration. Much of this appearance might be attributable to the reverse lordotic  projection and low lung volumes causing vascular crowding. 2. Upper normal heart size. Electronically Signed   By: Van Clines M.D.   On: 12/12/2017 19:03    Cardiac Studies   Echo 10/2017 Study Conclusions  - Left ventricle: The cavity size was normal. Wall thickness was increased in a pattern of mild LVH. Systolic function was normal. The estimated ejection fraction was in the range of 55% to 60%. Wall motion was normal; there were no regional wall motion abnormalities. Features are consistent with a pseudonormal left ventricular filling pattern, with concomitant abnormal relaxation and increased filling pressure (grade 2 diastolic dysfunction). - Mitral valve: Mildly calcified annulus. Valve area by pressure half-time: 2 cm^2. Valve area by continuity equation (using LVOT flow): 2.95 cm^2. - Left atrium: The atrium was mildly dilated.   Patient Profile     Amanda Cain is a 72 y.o. female with a hx of hypertension, diabetes, chronic diastolic heart failure, chronic kidney disease and asthma  presented for evaluation of shortness of breath. Admitted for pneumonia, asthma exacerbation and mild acute CHF.   Assessment & Plan    1. Acute on chronic diastolic CHF - BNP minimally elevated 212 at presentation. Weight has been stable with symptoms.  Got Iv lasix x 1 and now back on home dose of torsemide 20mg  BID. Net I  & O - 500cc. Scr minimally increase further to 2.24. Dr. Irish Lack discussed [place] if no improvement. Discussed with Dr. Harrington Challenger. Will plan RHC tomorrow. Hold Torsemide and follow renal function.   2. Acute on chronic kidney disease stage III-IV - Baseline creatinine around 1.6. Scr of 2.21 >>>2.24.   3. CAP with positive blood culture (??  Contamination)  - Per primary. On Abx.   4. Hyperkalemia - hold Kdur.   For questions or updates, please contact [place] Please consult [email or web address] for contact info under Cardiology/STEMI.     Patient seen and examined   Agree with findings as noted above by B Bhagat   Pt remains SOB   Exam difficlut  Cr increased today ON exam:   Neck Full  Lungs Rel clear  Cardiac RRR   No S 3 or signif murmurs  Ext with Tr edema  Volume status is difficult    Pt is still SOB   Cr rising I agree with R heart cath to define pressures   Plan on for tomorrow.  [address]     Signed, [place], [place]  12/14/2017,  10:26 AM

## 2017-12-14 NOTE — Progress Notes (Signed)
PHARMACY - PHYSICIAN COMMUNICATION CRITICAL VALUE ALERT - BLOOD CULTURE IDENTIFICATION (BCID)  Amanda Cain is an 72 y.o. female who presented to [facility] on 12/12/2017 with a chief complaint of c/o cough w green sputum for the past 1 week. + chills, increase in dyspnea   Name of physician (or Provider) Contacted: Tylene Fantasia  Current antibiotics: CTX and azithromycin  Changes to prescribed antibiotics recommended:  none  Results for orders placed or performed during the hospital encounter of 12/12/17  Blood Culture ID Panel (Reflexed) (Collected: 12/13/2017 12:46 AM)  Result Value Ref Range   Enterococcus species NOT DETECTED NOT DETECTED   Listeria monocytogenes NOT DETECTED NOT DETECTED   Staphylococcus species DETECTED (A) NOT DETECTED   Staphylococcus aureus NOT DETECTED NOT DETECTED   Methicillin resistance DETECTED (A) NOT DETECTED   Streptococcus species NOT DETECTED NOT DETECTED   Streptococcus agalactiae NOT DETECTED NOT DETECTED   Streptococcus pneumoniae NOT DETECTED NOT DETECTED   Streptococcus pyogenes NOT DETECTED NOT DETECTED   Acinetobacter baumannii NOT DETECTED NOT DETECTED   Enterobacteriaceae species NOT DETECTED NOT DETECTED   Enterobacter cloacae complex NOT DETECTED NOT DETECTED   Escherichia coli NOT DETECTED NOT DETECTED   Klebsiella oxytoca NOT DETECTED NOT DETECTED   Klebsiella pneumoniae NOT DETECTED NOT DETECTED   Proteus species NOT DETECTED NOT DETECTED   Serratia marcescens NOT DETECTED NOT DETECTED   Haemophilus influenzae NOT DETECTED NOT DETECTED   Neisseria meningitidis NOT DETECTED NOT DETECTED   Pseudomonas aeruginosa NOT DETECTED NOT DETECTED   Candida albicans NOT DETECTED NOT DETECTED   Candida glabrata NOT DETECTED NOT DETECTED   Candida krusei NOT DETECTED NOT DETECTED   Candida parapsilosis NOT DETECTED NOT DETECTED   Candida tropicalis NOT DETECTED NOT DETECTED    Dolly Rias RPh 12/14/2017, 3:19 AM Pager [phone]

## 2017-12-14 NOTE — Progress Notes (Signed)
PHARMACIST - PHYSICIAN COMMUNICATION DR:   Zigmund Daniel CONCERNING: Antibiotic IV to Oral Route Change Policy  RECOMMENDATION: This patient is receiving azithromycin by the intravenous route.  Based on criteria approved by the Pharmacy and Therapeutics Committee, the antibiotic(s) is/are being converted to the equivalent oral dose form(s).   DESCRIPTION: These criteria include:  Patient being treated for a respiratory tract infection, urinary tract infection, cellulitis or clostridium difficile associated diarrhea if on metronidazole  The patient is not neutropenic and does not exhibit a GI malabsorption state  The patient is eating (either orally or via tube) and/or has been taking other orally administered medications for a least 24 hours  The patient is improving clinically and has a Tmax < 100.5  If you have questions about this conversion, please contact the Pharmacy Department  []   [phone] )  [facility] []   [phone] )  [facility] []   [phone] )  [facility] []   [phone] )  [facility] [x]   [phone] )  [place], PharmD, BCPS 12/14/2017 10:09 AM

## 2017-12-14 NOTE — Progress Notes (Signed)
PROGRESS NOTE    Amanda Cain  ZOX:[id number] DOB: 01-02-1946 DOA: 12/12/2017 PCP: Mayra Neer, MD  Brief [phone] y.o.female,w Hypothyroidism, hypertension, hyperlipidemia, dm2, ckd stage 3, CHF (EF 55-60%), apparently c/o cough w green sputum for the past 1 week. + chills, increase in dyspnea today and therefore presented to ED for evaluation.   In Ed,  CXR IMPRESSION: 1. Indistinct opacities at the lung bases suggesting atelectasis or less likely pneumonia or aspiration. Much of this appearance might be attributable to the reverse lordotic projection and low lung volumes causing vascular crowding. 2. Upper normal heart size.  Na 132, K 4.3,  Bun 30, Creatinine 2.21  Trop <0.03  Wbc 11.4, Hgb 9.9, Plt 338  Pt will be admitted for CAP, ARF on CRF.     Assessment & Plan:   Principal Problem:   Community acquired pneumonia Active Problems:   Anemia   Type 2 diabetes mellitus with complication, without long-term current use of insulin (HCC)   CKD (chronic kidney disease), stage III (HCC)   CHF (congestive heart failure) (HCC)   CAP (community acquired pneumonia)  1]CAP-patient presented with complaints of increasing dyspnea and cough with green phlegm.  No documented fever.  Chest x-ray showed possible atelectasis versus pneumonia.  She has been started on Rocephin and azithromycin.  Add nebulizer treatments. Follow-up blood culture   2] diastolic CHF-per cardiology.  Currently on torsemide.  Mild increase in creatinine from baseline noted.  Possible right heart cath tomorrow if no improvement.  He is negative by 500 cc but weight remains the same  3] patient has history of asthma continue home medications including Singulair and Trelegy.  taper her steroids.    4]htn continue current medications.  5]dm continue home medications.  6]aki on ckd stage 3 monitor on diuretics.      DVT prophylaxisSCD Code Status:FULL Family  Communication:NONE Disposition Plan: TBD Consultants:  [facility]  Procedures:NONE AntimicrobialsROCEPHIN AZITHRO  Subjective: Sitting by the side of the bed in no acute distress feels breathing is better.   Objective: Vitals:   12/13/17 2004 12/14/17 0615 12/14/17 0743 12/14/17 1306  BP: (!) 173/71 (!) 156/69  (!) 145/92  Pulse: 85 65  60  Resp: (!) 24 (!) 24  19  Temp: 98.2 F (36.8 C) 97.9 F (36.6 C)  98.4 F (36.9 C)  TempSrc:    Oral  SpO2: 100% 100% 98% 100%  Weight:      Height:        Intake/Output Summary (Last 24 hours) at 12/14/2017 1412 Last data filed at 12/14/2017 0735 Gross per 24 hour  Intake -  Output 6 ml  Net -6 ml   Filed Weights   12/12/17 1711 12/13/17 1453  Weight: 103.4 kg (228 lb) 103.4 kg (227 lb 15.3 oz)    Examination:  General exam: Appears calm and comfortable  Respiratory system: Scattered wheezing bilaterally decreased breath sounds bilaterally auscultation. Respiratory effort normal. Cardiovascular system: S1 & S2 heard, RRR. No JVD, murmurs, rubs, gallops or clicks. No pedal edema. Gastrointestinal system: Abdomen is nondistended, soft and nontender. No organomegaly or masses felt. Normal bowel sounds heard. Central nervous system: Alert and oriented. No focal neurological deficits. Extremities: Symmetric 5 x 5 power. Skin: No rashes, lesions or ulcers Psychiatry: Judgement and insight appear normal. Mood & affect appropriate.     Data Reviewed: I have personally reviewed following labs and imaging studies  CBC: Recent Labs  Lab 12/12/17 1813  WBC 11.4*  NEUTROABS 9.5*  HGB  9.9*  HCT 31.2*  MCV 86.9  PLT 709   Basic Metabolic Panel: Recent Labs  Lab 12/12/17 1813 12/14/17 0525  NA 132* 134*  K 4.3 5.2*  CL 93* 95*  CO2 28 28  GLUCOSE 194* 194*  BUN 30* 45*  CREATININE 2.21* 2.24*  CALCIUM 9.1 9.2   GFR: Estimated Creatinine Clearance: 23.4 mL/min (A) (by C-G formula based on SCr of 2.24 mg/dL (H)). Liver  Function Tests: No results for input(s): AST, ALT, ALKPHOS, BILITOT, PROT, ALBUMIN in the last 168 hours. No results for input(s): LIPASE, AMYLASE in the last 168 hours. No results for input(s): AMMONIA in the last 168 hours. Coagulation Profile: No results for input(s): INR, PROTIME in the last 168 hours. Cardiac Enzymes: Recent Labs  Lab 12/12/17 1807  TROPONINI <0.03   BNP (last 3 results) Recent Labs    01/31/17 0933 11/14/17 0812  PROBNP 11 557*   HbA1C: No results for input(s): HGBA1C in the last 72 hours. CBG: Recent Labs  Lab 12/13/17 0919 12/14/17 1303  GLUCAP 87 246*   Lipid Profile: No results for input(s): CHOL, HDL, LDLCALC, TRIG, CHOLHDL, LDLDIRECT in the last 72 hours. Thyroid Function Tests: No results for input(s): TSH, T4TOTAL, FREET4, T3FREE, THYROIDAB in the last 72 hours. Anemia Panel: No results for input(s): VITAMINB12, FOLATE, FERRITIN, TIBC, IRON, RETICCTPCT in the last 72 hours. Sepsis Labs: No results for input(s): PROCALCITON, LATICACIDVEN in the last 168 hours.  Recent Results (from the past 240 hour(s))  Culture, blood (routine x 2) Call MD if unable to obtain prior to antibiotics being given     Status: None (Preliminary result)   Collection Time: 12/13/17 12:46 AM  Result Value Ref Range Status   Specimen Description   Final    BLOOD RIGHT ANTECUBITAL Performed at [facility] [address] [place], [place] [address]    Special Requests   Final    BOTTLES DRAWN AEROBIC AND ANAEROBIC Blood Culture adequate volume Performed at [facility] [address], [place], [place] [address]    Culture  Setup Time   Final    AEROBIC BOTTLE ONLY GRAM POSITIVE COCCI Organism ID to follow CRITICAL RESULT CALLED TO, READ BACK BY AND VERIFIED WITHSeleta Rhymes [facility] 12/14/17 0305 JDW Performed at [facility], [place] [address], [place], [place] [address]    Culture PENDING  Incomplete   Report Status  PENDING  Incomplete  Blood Culture ID Panel (Reflexed)     Status: Abnormal   Collection Time: 12/13/17 12:46 AM  Result Value Ref Range Status   Enterococcus species NOT DETECTED NOT DETECTED Final   Listeria monocytogenes NOT DETECTED NOT DETECTED Final   Staphylococcus species DETECTED (A) NOT DETECTED Final    Comment: Methicillin (oxacillin) resistant coagulase negative staphylococcus. Possible blood culture contaminant (unless isolated from more than one blood culture draw or clinical case suggests pathogenicity). No antibiotic treatment is indicated for blood  culture contaminants. CRITICAL RESULT CALLED TO, READ BACK BY AND VERIFIED WITH: E JACKSON PHARMD 12/14/17 0305 JDW    Staphylococcus aureus NOT DETECTED NOT DETECTED Final   Methicillin resistance DETECTED (A) NOT DETECTED Final    Comment: CRITICAL RESULT CALLED TO, READ BACK BY AND VERIFIED WITH: E JACKSON PHARMD 12/14/17 0305 JDW    Streptococcus species NOT DETECTED NOT DETECTED Final   Streptococcus agalactiae NOT DETECTED NOT DETECTED Final   Streptococcus pneumoniae NOT DETECTED NOT DETECTED Final   Streptococcus pyogenes NOT DETECTED  NOT DETECTED Final   Acinetobacter baumannii NOT DETECTED NOT DETECTED Final   Enterobacteriaceae species NOT DETECTED NOT DETECTED Final   Enterobacter cloacae complex NOT DETECTED NOT DETECTED Final   Escherichia coli NOT DETECTED NOT DETECTED Final   Klebsiella oxytoca NOT DETECTED NOT DETECTED Final   Klebsiella pneumoniae NOT DETECTED NOT DETECTED Final   Proteus species NOT DETECTED NOT DETECTED Final   Serratia marcescens NOT DETECTED NOT DETECTED Final   Haemophilus influenzae NOT DETECTED NOT DETECTED Final   Neisseria meningitidis NOT DETECTED NOT DETECTED Final   Pseudomonas aeruginosa NOT DETECTED NOT DETECTED Final   Candida albicans NOT DETECTED NOT DETECTED Final   Candida glabrata NOT DETECTED NOT DETECTED Final   Candida krusei NOT DETECTED NOT DETECTED Final    Candida parapsilosis NOT DETECTED NOT DETECTED Final   Candida tropicalis NOT DETECTED NOT DETECTED Final    Comment: Performed at [facility], [place] [address], [place], [place] [address]  Respiratory Panel by PCR     Status: None   Collection Time: 12/13/17  3:31 PM  Result Value Ref Range Status   Adenovirus NOT DETECTED NOT DETECTED Final   Coronavirus 229E NOT DETECTED NOT DETECTED Final   Coronavirus HKU1 NOT DETECTED NOT DETECTED Final   Coronavirus NL63 NOT DETECTED NOT DETECTED Final   Coronavirus OC43 NOT DETECTED NOT DETECTED Final   Metapneumovirus NOT DETECTED NOT DETECTED Final   Rhinovirus / Enterovirus NOT DETECTED NOT DETECTED Final   Influenza A NOT DETECTED NOT DETECTED Final   Influenza B NOT DETECTED NOT DETECTED Final   Parainfluenza Virus 1 NOT DETECTED NOT DETECTED Final   Parainfluenza Virus 2 NOT DETECTED NOT DETECTED Final   Parainfluenza Virus 3 NOT DETECTED NOT DETECTED Final   Parainfluenza Virus 4 NOT DETECTED NOT DETECTED Final   Respiratory Syncytial Virus NOT DETECTED NOT DETECTED Final   Bordetella pertussis NOT DETECTED NOT DETECTED Final   Chlamydophila pneumoniae NOT DETECTED NOT DETECTED Final   Mycoplasma pneumoniae NOT DETECTED NOT DETECTED Final         Radiology Studies: Dg Chest 2 View  Result Date: 12/12/2017 CLINICAL DATA:  Patient is shortness of breath starting approx. 30 min ago. Hx asthma, CHF, HTN, diabetes, no other chest complaints EXAM: CHEST - 2 VIEW COMPARISON:  11/20/2017 FINDINGS: Low lung volumes are present, causing crowding of the pulmonary vasculature. Indistinct pulmonary vasculature with hazy opacities at the lung bases. Reverse lordotic projection may be exacerbating the basilar findings. Upper normal heart size. No pleural effusion. Thoracic spondylosis. IMPRESSION: 1. Indistinct opacities at the lung bases suggesting atelectasis or less likely pneumonia or aspiration. Much of this appearance might be attributable  to the reverse lordotic projection and low lung volumes causing vascular crowding. 2. Upper normal heart size. Electronically Signed   By: Van Clines M.D.   On: 12/12/2017 19:03        Scheduled Meds: . atorvastatin  80 mg Oral Daily  . azithromycin  500 mg Oral QHS  . diltiazem  360 mg Oral Daily  . escitalopram  20 mg Oral QHS  . estradiol  0.5 mg Oral q morning - 10a  . ezetimibe  10 mg Oral Daily  . ferrous sulfate  325 mg Oral BID WC  . fluticasone  1 spray Each Nare Daily  . fluticasone furoate-vilanterol  1 puff Inhalation Daily   And  . umeclidinium bromide  1 puff Inhalation Daily  . glimepiride  1 mg Oral Q breakfast  .  heparin  5,000 Units Subcutaneous Q8H  . levothyroxine  250 mcg Oral QAC breakfast  . linagliptin  5 mg Oral Daily  . LORazepam  0.5 mg Oral BID  . methylPREDNISolone (SOLU-MEDROL) injection  40 mg Intravenous Q12H  . metoprolol succinate  100 mg Oral q morning - 10a  . montelukast  10 mg Oral QHS  . multivitamin with minerals  1 tablet Oral Daily  . olopatadine  1 drop Both Eyes Daily  . paliperidone  3 mg Oral QHS  . pantoprazole  40 mg Oral Daily  . saccharomyces boulardii  250 mg Oral BID  . sodium chloride flush  3 mL Intravenous Q12H  . sodium chloride flush  3 mL Intravenous Q12H  . sucralfate  1 g Oral BID   Continuous Infusions: . sodium chloride    . sodium chloride    . [START ON 12/15/2017] sodium chloride    . cefTRIAXone (ROCEPHIN)  IV Stopped (12/14/17 0130)     LOS: 2 days     Georgette Shell, MD [facility]  If 7PM-7AM, please contact night-coverage [email or web address] Password [email or web address] 12/14/2017, 2:12 PM

## 2017-12-15 ENCOUNTER — Encounter (HOSPITAL_COMMUNITY)
Admission: EM | Disposition: A | Discharge status: Home Health | Payer: Self-pay | Source: Home / Self Care | Attending: Internal Medicine

## 2017-12-15 DIAGNOSIS — I5033 Acute on chronic diastolic (congestive) heart failure: Secondary | ICD-10-CM

## 2017-12-15 HISTORY — PX: RIGHT HEART CATH: CATH118263

## 2017-12-15 LAB — POCT I-STAT 3, VENOUS BLOOD GAS (G3P V)
Acid-Base Excess: 2 mmol/L (ref 0.0–2.0)
Acid-Base Excess: 3 mmol/L — ABNORMAL HIGH (ref 0.0–2.0)
Bicarbonate: 29.5 mmol/L — ABNORMAL HIGH (ref 20.0–28.0)
Bicarbonate: 30.2 mmol/L — ABNORMAL HIGH (ref 20.0–28.0)
O2 Saturation: 63 %
O2 Saturation: 64 %
TCO2: 31 mmol/L (ref 22–32)
TCO2: 32 mmol/L (ref 22–32)
pCO2, Ven: 57.4 mmHg (ref 44.0–60.0)
pCO2, Ven: 57.5 mmHg (ref 44.0–60.0)
pH, Ven: 7.319 (ref 7.250–7.430)
pH, Ven: 7.328 (ref 7.250–7.430)
pO2, Ven: 36 mmHg (ref 32.0–45.0)
pO2, Ven: 37 mmHg (ref 32.0–45.0)

## 2017-12-15 LAB — BASIC METABOLIC PANEL
Anion gap: 11 (ref 5–15)
BUN: 60 mg/dL — ABNORMAL HIGH (ref 8–23)
CO2: 28 mmol/L (ref 22–32)
Calcium: 9 mg/dL (ref 8.9–10.3)
Chloride: 93 mmol/L — ABNORMAL LOW (ref 98–111)
Creatinine, Ser: 2.3 mg/dL — ABNORMAL HIGH (ref 0.44–1.00)
GFR calc Af Amer: 23 mL/min — ABNORMAL LOW (ref >60)
GFR calc non Af Amer: 20 mL/min — ABNORMAL LOW (ref >60)
Glucose, Bld: 192 mg/dL — ABNORMAL HIGH (ref 70–99)
Potassium: 4.7 mmol/L (ref 3.5–5.1)
Sodium: 132 mmol/L — ABNORMAL LOW (ref 135–145)

## 2017-12-15 LAB — STREP PNEUMONIAE URINARY ANTIGEN: Strep Pneumo Urinary Antigen: NEGATIVE

## 2017-12-15 LAB — GLUCOSE, CAPILLARY
Glucose-Capillary: 173 mg/dL — ABNORMAL HIGH (ref 70–99)
Glucose-Capillary: 158 mg/dL — ABNORMAL HIGH (ref 70–99)
Glucose-Capillary: 251 mg/dL — ABNORMAL HIGH (ref 70–99)

## 2017-12-15 SURGERY — RIGHT HEART CATH

## 2017-12-15 MED ORDER — HYPROMELLOSE (GONIOSCOPIC) 2.5 % OP SOLN
1.0000 [drp] | OPHTHALMIC | Status: DC | PRN
  Administered 2017-12-16 – 2017-12-19 (×3): 1 [drp] via OPHTHALMIC
  Filled 2017-12-15: qty 15

## 2017-12-15 MED ORDER — FUROSEMIDE 10 MG/ML IJ SOLN
80.0000 mg | Freq: Every day | INTRAMUSCULAR | Status: DC
  Administered 2017-12-15 – 2017-12-18 (×4): 80 mg via INTRAVENOUS
  Filled 2017-12-15 (×3): qty 8

## 2017-12-15 MED ORDER — LIDOCAINE HCL (PF) 1 % IJ SOLN
INTRAMUSCULAR | Status: DC | PRN
  Administered 2017-12-15: 2 mL

## 2017-12-15 MED ORDER — PREDNISONE 20 MG PO TABS
30.0000 mg | ORAL_TABLET | Freq: Every day | ORAL | Status: AC
  Administered 2017-12-16 – 2017-12-19 (×4): 30 mg via ORAL
  Filled 2017-12-15 (×4): qty 1

## 2017-12-15 MED ORDER — HEPARIN (PORCINE) IN NACL 1000-0.9 UT/500ML-% IV SOLN
INTRAVENOUS | Status: AC
  Filled 2017-12-15: qty 500

## 2017-12-15 MED ORDER — LIDOCAINE HCL (PF) 1 % IJ SOLN
INTRAMUSCULAR | Status: AC
  Filled 2017-12-15: qty 30

## 2017-12-15 MED ORDER — GLIMEPIRIDE 4 MG PO TABS
2.0000 mg | ORAL_TABLET | Freq: Every day | ORAL | Status: DC
Start: 2017-12-16 — End: 2017-12-19
  Administered 2017-12-16 – 2017-12-19 (×4): 2 mg via ORAL
  Filled 2017-12-15 (×4): qty 1

## 2017-12-15 MED ORDER — HEPARIN (PORCINE) IN NACL 1000-0.9 UT/500ML-% IV SOLN
INTRAVENOUS | Status: DC | PRN
  Administered 2017-12-15: 500 mL

## 2017-12-15 MED ORDER — FUROSEMIDE 10 MG/ML IJ SOLN
INTRAMUSCULAR | Status: AC
  Filled 2017-12-15: qty 8

## 2017-12-15 MED ORDER — LIP MEDEX EX OINT
TOPICAL_OINTMENT | CUTANEOUS | Status: DC | PRN
  Filled 2017-12-15: qty 7

## 2017-12-15 SURGICAL SUPPLY — 7 items
CATH BALLN WEDGE 5F 110CM (CATHETERS) ×1 IMPLANT
PACK CARDIAC CATHETERIZATION (CUSTOM PROCEDURE TRAY) ×1 IMPLANT
PROTECTION STATION PRESSURIZED (MISCELLANEOUS) ×2
SHEATH GLIDE SLENDER 4/5FR (SHEATH) ×1 IMPLANT
STATION PROTECTION PRESSURIZED (MISCELLANEOUS) IMPLANT
TRANSDUCER W/STOPCOCK (MISCELLANEOUS) ×1 IMPLANT
WIRE EMERALD 3MM-J .025X260CM (WIRE) ×1 IMPLANT

## 2017-12-15 NOTE — Research (Signed)
[place] Informed Consent   Subject Name: Amanda Cain  Subject met inclusion and exclusion criteria.  The informed consent form, study requirements and expectations were reviewed with the subject and questions and concerns were addressed prior to the signing of the consent form.  The subject verbalized understanding of the trail requirements.  The subject agreed to participate in the [facility] trial and signed the informed consent.  The informed consent was obtained prior to performance of any protocol-specific procedures for the subject.  A copy of the signed informed consent was given to the subject and a copy was placed in the subject's medical record.  Neva Seat 12/15/2017, 11:09 AM

## 2017-12-15 NOTE — Care Management Important Message (Signed)
Important Message  Patient Details  Name: ALVETTA HIDROGO MRN: [id number] Date of Birth: 1946-01-23   Medicare Important Message Given:  Yes    Kerin Salen 12/15/2017, 11:57 AMImportant Message  Patient Details  Name: ISAAC LACSON MRN: [id number] Date of Birth: 1945/12/17   Medicare Important Message Given:  Yes    Kerin Salen 12/15/2017, 11:56 AM

## 2017-12-15 NOTE — Progress Notes (Signed)
Pt received in cath holding pre cath via [facility]. Awake, alert, oriented, MAE. Pain free No difficulty breathing. O2 via [place] @ 2L/M

## 2017-12-15 NOTE — Progress Notes (Signed)
PROGRESS NOTE    Amanda Cain  OZD:[id number] DOB: 01-27-1946 DOA: 12/12/2017 PCP: Mayra Neer, MD   Brief Narrative: 72 y.o.female,w Hypothyroidism, hypertension, hyperlipidemia, dm2, ckd stage 3, CHF (EF 55-60%), apparently c/o cough w green sputum for the past 1 week. + chills, increase in dyspnea today and therefore presented to ED for evaluation.   In Ed,  CXR IMPRESSION: 1. Indistinct opacities at the lung bases suggesting atelectasis or less likely pneumonia or aspiration. Much of this appearance might be attributable to the reverse lordotic projection and low lung volumes causing vascular crowding. 2. Upper normal heart size.  Na 132, K 4.3,  Bun 30, Creatinine 2.21  Trop <0.03  Wbc 11.4, Hgb 9.9, Plt 338  Pt will be admitted for CAP, ARF on CRF.    Assessment & Plan:   Principal Problem:   Community acquired pneumonia Active Problems:   Anemia   Type 2 diabetes mellitus with complication, without long-term current use of insulin (HCC)   Acute on chronic diastolic heart failure (HCC)   CKD (chronic kidney disease), stage III (HCC)   CHF (congestive heart failure) (HCC)   CAP (community acquired pneumonia)  1]Acute on chronic Diastolic CHF-patient was being diuresed with torsemide 20 mg twice a day with not much improvement in symptoms and urine output.  Creatinine increased mildly.  Pressures including she is now status post right heart cath tomorrow.  Moderate to severe elevated left and right heart filling pressures and pulmonary hypertension.  Patient transferred to [facility] for the procedure and cardiology would like to keep her at [facility] under [facility] service.  Patient to be diuresed aggressively.  Monitor renal functions.  Echo 10/2017 ejection fraction 55 to 60% no wall motion abnormalities.  Patient started on Lasix 80 mg IV daily dose is being monitored by cardiology.  2] mild asthma exacerbation Taper steroids continue Singulair and  Trelegy.  3]BLOOD culture-mssa possible contaminant.  4]CAP-continue Rocephin and azithromycin and nebulizers.  Patient feels her breathing and cough is better.  5] type 2 diabetes blood sugar elevated patient also on steroids continue Tradjenta Amaryl and SSI.  Steroids being tapered today to p.o. Prednisone.  Check hemoglobin A1c last when I have a 6.3 from 10/2016.  6] AKI with CKD stage IV monitor on  increasing dose of Lasix.  7] hypertension patient currently on Cardizem beta-blocker and Lasix monitor.  8] hypothyroidism last TSH was 1.2 from 10/2016 will recheck TSH tomorrow.  9] depression continue Lexapro patient reports she was taking lorazepam 0.5 mg twice a day at home, the dose will be increased to twice a day.  Continue invega.      DVT prophylaxis: Heparin  Code Status full Family Communication:none Disposition Plan:tbd Consultants: cards   Procedures: Right heart catheterization  12/15/2017 Antimicrobials: Rocephin and azithromycin  Subjective: She is sitting up in her chair appears short winded but she states she is better dyspnea on exertion even with mild exertion.  Objective: Vitals:   12/15/17 1415 12/15/17 1430 12/15/17 1445 12/15/17 1525  BP: (!) 156/54 (!) 148/55 (!) 141/47   Pulse: (!) 58 (!) 58 (!) 58 62  Resp: 20 20 20    Temp:    97.8 F (36.6 C)  TempSrc:    Oral  SpO2: 93% 91% 92%   Weight:      Height:        Intake/Output Summary (Last 24 hours) at 12/15/2017 1644 Last data filed at 12/15/2017 0840 Gross per 24 hour  Intake 300 ml  Output 501 ml  Net -201 ml   Filed Weights   12/12/17 1711 12/13/17 1453 12/14/17 1719  Weight: 103.4 kg (228 lb) 103.4 kg (227 lb 15.3 oz) 105 kg (231 lb 6.4 oz)    Examination:  General exam: Appears calm and comfortable  Respiratory system: Coarse breath sounds crackles and some wheezing auscultation. Respiratory effort normal. Cardiovascular system: S1 & S2 heard, RRR. No JVD, murmurs, rubs,  gallops or clicks. No pedal edema. Gastrointestinal system: Abdomen is nondistended, soft and nontender. No organomegaly or masses felt. Normal bowel sounds heard. Central nervous system: Alert and oriented. No focal neurological deficits. Extremities: 2 plus edema Skin: No rashes, lesions or ulcers Psychiatry: Judgement and insight appear normal. Mood & affect appropriate.     Data Reviewed: I have personally reviewed following labs and imaging studies  CBC: Recent Labs  Lab 12/12/17 1813  WBC 11.4*  NEUTROABS 9.5*  HGB 9.9*  HCT 31.2*  MCV 86.9  PLT 470   Basic Metabolic Panel: Recent Labs  Lab 12/12/17 1813 12/14/17 0525 12/15/17 0520  NA 132* 134* 132*  K 4.3 5.2* 4.7  CL 93* 95* 93*  CO2 28 28 28   GLUCOSE 194* 194* 192*  BUN 30* 45* 60*  CREATININE 2.21* 2.24* 2.30*  CALCIUM 9.1 9.2 9.0   GFR: Estimated Creatinine Clearance: 23 mL/min (A) (by C-G formula based on SCr of 2.3 mg/dL (H)). Liver Function Tests: No results for input(s): AST, ALT, ALKPHOS, BILITOT, PROT, ALBUMIN in the last 168 hours. No results for input(s): LIPASE, AMYLASE in the last 168 hours. No results for input(s): AMMONIA in the last 168 hours. Coagulation Profile: No results for input(s): INR, PROTIME in the last 168 hours. Cardiac Enzymes: Recent Labs  Lab 12/12/17 1807  TROPONINI <0.03   BNP (last 3 results) Recent Labs    01/31/17 0933 11/14/17 0812  PROBNP 11 557*   HbA1C: No results for input(s): HGBA1C in the last 72 hours. CBG: Recent Labs  Lab 12/14/17 1303 12/14/17 1641 12/14/17 2101 12/15/17 0728 12/15/17 1241  GLUCAP 246* 271* 255* 173* 158*   Lipid Profile: No results for input(s): CHOL, HDL, LDLCALC, TRIG, CHOLHDL, LDLDIRECT in the last 72 hours. Thyroid Function Tests: No results for input(s): TSH, T4TOTAL, FREET4, T3FREE, THYROIDAB in the last 72 hours. Anemia Panel: No results for input(s): VITAMINB12, FOLATE, FERRITIN, TIBC, IRON, RETICCTPCT in the  last 72 hours. Sepsis Labs: No results for input(s): PROCALCITON, LATICACIDVEN in the last 168 hours.  Recent Results (from the past 240 hour(s))  Culture, blood (routine x 2) Call MD if unable to obtain prior to antibiotics being given     Status: Abnormal (Preliminary result)   Collection Time: 12/13/17 12:46 AM  Result Value Ref Range Status   Specimen Description   Final    BLOOD RIGHT ANTECUBITAL Performed at [facility] [address] [place], [place] [address]    Special Requests   Final    BOTTLES DRAWN AEROBIC AND ANAEROBIC Blood Culture adequate volume Performed at [facility] [address], [place], [place] [address]    Culture  Setup Time   Final    AEROBIC BOTTLE ONLY GRAM POSITIVE COCCI Organism ID to follow CRITICAL RESULT CALLED TO, READ BACK BY AND VERIFIED WITHSeleta Rhymes [facility] 12/14/17 0305 JDW Performed at [facility], [place] [address] [place], [place] [address]    Culture STAPHYLOCOCCUS SPECIES (COAGULASE NEGATIVE) (A)  Final   Report Status PENDING  Incomplete  Blood Culture ID Panel (Reflexed)     Status: Abnormal   Collection Time: 12/13/17 12:46 AM  Result Value Ref Range Status   Enterococcus species NOT DETECTED NOT DETECTED Final   Listeria monocytogenes NOT DETECTED NOT DETECTED Final   Staphylococcus species DETECTED (A) NOT DETECTED Final    Comment: Methicillin (oxacillin) resistant coagulase negative staphylococcus. Possible blood culture contaminant (unless isolated from more than one blood culture draw or clinical case suggests pathogenicity). No antibiotic treatment is indicated for blood  culture contaminants. CRITICAL RESULT CALLED TO, READ BACK BY AND VERIFIED WITH: E JACKSON PHARMD 12/14/17 0305 JDW    Staphylococcus aureus NOT DETECTED NOT DETECTED Final   Methicillin resistance DETECTED (A) NOT DETECTED Final    Comment: CRITICAL RESULT CALLED TO, READ BACK BY AND VERIFIED WITH: E JACKSON  PHARMD 12/14/17 0305 JDW    Streptococcus species NOT DETECTED NOT DETECTED Final   Streptococcus agalactiae NOT DETECTED NOT DETECTED Final   Streptococcus pneumoniae NOT DETECTED NOT DETECTED Final   Streptococcus pyogenes NOT DETECTED NOT DETECTED Final   Acinetobacter baumannii NOT DETECTED NOT DETECTED Final   Enterobacteriaceae species NOT DETECTED NOT DETECTED Final   Enterobacter cloacae complex NOT DETECTED NOT DETECTED Final   Escherichia coli NOT DETECTED NOT DETECTED Final   Klebsiella oxytoca NOT DETECTED NOT DETECTED Final   Klebsiella pneumoniae NOT DETECTED NOT DETECTED Final   Proteus species NOT DETECTED NOT DETECTED Final   Serratia marcescens NOT DETECTED NOT DETECTED Final   Haemophilus influenzae NOT DETECTED NOT DETECTED Final   Neisseria meningitidis NOT DETECTED NOT DETECTED Final   Pseudomonas aeruginosa NOT DETECTED NOT DETECTED Final   Candida albicans NOT DETECTED NOT DETECTED Final   Candida glabrata NOT DETECTED NOT DETECTED Final   Candida krusei NOT DETECTED NOT DETECTED Final   Candida parapsilosis NOT DETECTED NOT DETECTED Final   Candida tropicalis NOT DETECTED NOT DETECTED Final    Comment: Performed at [facility], [place] [address], [place], [place] [address]  Culture, blood (routine x 2) Call MD if unable to obtain prior to antibiotics being given     Status: None (Preliminary result)   Collection Time: 12/13/17  9:48 AM  Result Value Ref Range Status   Specimen Description   Final    BLOOD RIGHT ANTECUBITAL Performed at [facility] [address], [place], [place] [address]    Special Requests   Final    BOTTLES DRAWN AEROBIC AND ANAEROBIC Blood Culture adequate volume Performed at [facility] [address], [place], [place] [address]    Culture   Final    NO GROWTH 2 DAYS Performed at [facility] [address], [place],  [address]    Report Status PENDING  Incomplete   Respiratory Panel by PCR     Status: None   Collection Time: 12/13/17  3:31 PM  Result Value Ref Range Status   Adenovirus NOT DETECTED NOT DETECTED Final   Coronavirus 229E NOT DETECTED NOT DETECTED Final   Coronavirus HKU1 NOT DETECTED NOT DETECTED Final   Coronavirus NL63 NOT DETECTED NOT DETECTED Final   Coronavirus OC43 NOT DETECTED NOT DETECTED Final   Metapneumovirus NOT DETECTED NOT DETECTED Final   Rhinovirus / Enterovirus NOT DETECTED NOT DETECTED Final   Influenza A NOT DETECTED NOT DETECTED Final   Influenza B NOT DETECTED NOT DETECTED Final   Parainfluenza Virus 1 NOT DETECTED NOT DETECTED Final   Parainfluenza Virus 2  NOT DETECTED NOT DETECTED Final   Parainfluenza Virus 3 NOT DETECTED NOT DETECTED Final   Parainfluenza Virus 4 NOT DETECTED NOT DETECTED Final   Respiratory Syncytial Virus NOT DETECTED NOT DETECTED Final   Bordetella pertussis NOT DETECTED NOT DETECTED Final   Chlamydophila pneumoniae NOT DETECTED NOT DETECTED Final   Mycoplasma pneumoniae NOT DETECTED NOT DETECTED Final         Radiology Studies: No results found.      Scheduled Meds: . atorvastatin  80 mg Oral Daily  . azithromycin  500 mg Oral QHS  . diltiazem  360 mg Oral Daily  . escitalopram  20 mg Oral QHS  . estradiol  0.5 mg Oral q morning - 10a  . ezetimibe  10 mg Oral Daily  . ferrous sulfate  325 mg Oral BID WC  . fluticasone  1 spray Each Nare Daily  . fluticasone furoate-vilanterol  1 puff Inhalation Daily   And  . umeclidinium bromide  1 puff Inhalation Daily  . furosemide  80 mg Intravenous Daily  . glimepiride  1 mg Oral Q breakfast  . heparin  5,000 Units Subcutaneous Q8H  . levothyroxine  250 mcg Oral QAC breakfast  . linagliptin  5 mg Oral Daily  . LORazepam  0.5 mg Oral BID  . methylPREDNISolone (SOLU-MEDROL) injection  40 mg Intravenous Q12H  . metoprolol succinate  100 mg Oral q morning - 10a  . montelukast  10 mg Oral QHS  . multivitamin with minerals  1  tablet Oral Daily  . olopatadine  1 drop Both Eyes Daily  . paliperidone  3 mg Oral QHS  . pantoprazole  40 mg Oral Daily  . saccharomyces boulardii  250 mg Oral BID  . sodium chloride flush  3 mL Intravenous Q12H  . sucralfate  1 g Oral BID   Continuous Infusions: . sodium chloride    . cefTRIAXone (ROCEPHIN)  IV Stopped (12/15/17 0107)     LOS: 3 days     Georgette Shell, MD [facility]  If 7PM-7AM, please contact night-coverage [email or web address] Password [facility] 12/15/2017, 4:44 PM

## 2017-12-15 NOTE — Interval H&P Note (Signed)
History and Physical Interval Note:  12/15/2017 11:48 AM  [name]  has presented today for cardiac catheterization, with the diagnosis of diastolic heart failure  The various methods of treatment have been discussed with the patient and family. After consideration of risks, benefits and other options for treatment, the patient has consented to  Procedure(s): RIGHT HEART CATH (N/A) as a surgical intervention .  The patient's history has been reviewed, patient examined, no change in status, stable for surgery.  I have reviewed the patient's chart and labs.  Questions were answered to the patient's satisfaction.     Amanda Cain

## 2017-12-15 NOTE — Progress Notes (Signed)
Progress Note  Patient Name: Amanda Cain Date of Encounter: 12/15/2017  Primary Cardiologist: Larae Grooms, MD   Subjective   Pt SOB   No CP   Inpatient Medications    Scheduled Meds: . atorvastatin  80 mg Oral Daily  . azithromycin  500 mg Oral QHS  . diltiazem  360 mg Oral Daily  . escitalopram  20 mg Oral QHS  . estradiol  0.5 mg Oral q morning - 10a  . ezetimibe  10 mg Oral Daily  . ferrous sulfate  325 mg Oral BID WC  . fluticasone  1 spray Each Nare Daily  . fluticasone furoate-vilanterol  1 puff Inhalation Daily   And  . umeclidinium bromide  1 puff Inhalation Daily  . glimepiride  1 mg Oral Q breakfast  . heparin  5,000 Units Subcutaneous Q8H  . levothyroxine  250 mcg Oral QAC breakfast  . linagliptin  5 mg Oral Daily  . LORazepam  0.5 mg Oral BID  . methylPREDNISolone (SOLU-MEDROL) injection  40 mg Intravenous Q12H  . metoprolol succinate  100 mg Oral q morning - 10a  . montelukast  10 mg Oral QHS  . multivitamin with minerals  1 tablet Oral Daily  . olopatadine  1 drop Both Eyes Daily  . paliperidone  3 mg Oral QHS  . pantoprazole  40 mg Oral Daily  . saccharomyces boulardii  250 mg Oral BID  . sodium chloride flush  3 mL Intravenous Q12H  . sodium chloride flush  3 mL Intravenous Q12H  . sucralfate  1 g Oral BID   Continuous Infusions: . sodium chloride    . sodium chloride    . sodium chloride 10 mL/hr at 12/15/17 0621  . cefTRIAXone (ROCEPHIN)  IV Stopped (12/15/17 0107)   PRN Meds: sodium chloride, sodium chloride, albuterol, guaiFENesin-dextromethorphan, lip balm, ondansetron, polyvinyl alcohol, sodium chloride flush, sodium chloride flush   Vital Signs    Vitals:   12/14/17 1306 12/14/17 1719 12/14/17 2100 12/15/17 0542  BP: (!) 145/92  (!) 164/67 (!) 155/70  Pulse: 60  63 (!) 59  Resp: 19  18 20   Temp: 98.4 F (36.9 C)  98.6 F (37 C) 98.5 F (36.9 C)  TempSrc: Oral  Oral   SpO2: 100%  100% 100%  Weight:  231 lb 6.4 oz (105  kg)    Height:        Intake/Output Summary (Last 24 hours) at 12/15/2017 0800 Last data filed at 12/15/2017 0556 Gross per 24 hour  Intake 300 ml  Output -  Net 300 ml   Filed Weights   12/12/17 1711 12/13/17 1453 12/14/17 1719  Weight: 228 lb (103.4 kg) 227 lb 15.3 oz (103.4 kg) 231 lb 6.4 oz (105 kg)    Physical Exam   GEN: Obese female in no acute distress.   Neck:  Neck full   Cardiac: RRR, no murmurs, rubs, or gallops.  Respiratory: Clear to auscultation bilaterally. GI: Soft, nontender, distended  MS: trace bilateral edema; No deformity. Neuro:  Nonfocal  Psych: Normal affect   Labs    Chemistry Recent Labs  Lab 12/12/17 1813 12/14/17 0525 12/15/17 0520  NA 132* 134* 132*  K 4.3 5.2* 4.7  CL 93* 95* 93*  CO2 28 28 28   GLUCOSE 194* 194* 192*  BUN 30* 45* 60*  CREATININE 2.21* 2.24* 2.30*  CALCIUM 9.1 9.2 9.0  GFRNONAA 21* 20* 20*  GFRAA 24* 24* 23*  ANIONGAP 11 11 11  Hematology Recent Labs  Lab 12/12/17 1813  WBC 11.4*  RBC 3.59*  HGB 9.9*  HCT 31.2*  MCV 86.9  MCH 27.6  MCHC 31.7  RDW 15.8*  PLT 338    Cardiac Enzymes Recent Labs  Lab 12/12/17 1807  TROPONINI <0.03   BNP Recent Labs  Lab 12/12/17 1726  BNP 212.5*      Radiology    No results found.  Cardiac Studies   Echo 10/2017 Study Conclusions  - Left ventricle: The cavity size was normal. Wall thickness was increased in a pattern of mild LVH. Systolic function was normal. The estimated ejection fraction was in the range of 55% to 60%. Wall motion was normal; there were no regional wall motion abnormalities. Features are consistent with a pseudonormal left ventricular filling pattern, with concomitant abnormal relaxation and increased filling pressure (grade 2 diastolic dysfunction). - Mitral valve: Mildly calcified annulus. Valve area by pressure half-time: 2 cm^2. Valve area by continuity equation (using LVOT flow): 2.95 cm^2. - Left atrium:  The atrium was mildly dilated.   Patient Profile     Amanda Cain is a 72 y.o. female with a hx of hypertension, diabetes, chronic diastolic heart failure, chronic kidney disease and asthma  presented for evaluation of shortness of breath. Admitted for pneumonia, asthma exacerbation and mild acute CHF.   Assessment & Plan    1. Acute on chronic diastolic CHF  R heart cath today     2. Acute on chronic kidney disease stage III-IV   Needs to be follow closely  Cr 2.3 today     3. CAP with positive blood culture (??  Contamination)  .   4. Hyperkalemia  K 4.7    Further Rx based on R heart cath results      Signed, Dorris Carnes, MD  12/15/2017, 8:00 AM

## 2017-12-15 NOTE — Progress Notes (Signed)
R heart cath results    RA (mean): 20 mmHg RV (S/EDP): 68/20 mmHg PA (S/D, mean): 66/30 (42) mmHg PCWP (mean): 30 mmHg  Ao sat: 99% (vial pulseoximeter) PA sat: 63%  Fick CO: 5.4 L/min Fick CI: 2.7 L/min/m^2''  Pt needs signif diuresis    Lasix ordered     Will need signficant diuresis with PCWP of 30    Follow renal function closely   If declines may be helpful to contact renal service to assist, esp in long term management.  Dorris Carnes

## 2017-12-15 NOTE — Care Management Note (Signed)
Case Management Note  Patient Details  Name: Amanda Cain MRN: [id number] Date of Birth: Oct 01, 1945  Subjective/Objective:72 y/o f admitted w/PNA. Hx: CHF, CKD. Cardio following-cardiac cath. [facility] services-receives aide-10hrs/week.                    Action/Plan:d/c plan home.   Expected Discharge Date:  (unknown)               Expected Discharge Plan:  Home/Self Care  In-House Referral:     Discharge planning Services  CM Consult  Post Acute Care Choice:  Resumption of Svcs/PTA Provider([facility] custodial level services-10hrs week.) Choice offered to:     DME Arranged:    DME Agency:     HH Arranged:    HH Agency:     Status of Service:  In process, will continue to follow  If discussed at Long Length of Stay Meetings, dates discussed:    Additional Comments:  Dessa Phi, RN 12/15/2017, 11:51 AM

## 2017-12-15 NOTE — Progress Notes (Signed)
Inpatient Diabetes Program Recommendations  AACE/ADA: New Consensus Statement on Inpatient Glycemic Control (2015)  Target Ranges:  Prepandial:   less than 140 mg/dL      Peak postprandial:   less than 180 mg/dL (1-2 hours)      Critically ill patients:  140 - 180 mg/dL   Lab Results  Component Value Date   GLUCAP 173 (H) 12/15/2017   HGBA1C 6.3 (H) 11/17/2016    Review of Glycemic Control  No correction insulin has been ordered.  Inpatient Diabetes Program Recommendations:     Please order Novolog 0-9 units Q4H if NPO or tidwc if eating.  Will follow.  Thank you. Lorenda Peck, RD, LDN, CDE Inpatient Diabetes Coordinator [phone]

## 2017-12-16 LAB — BASIC METABOLIC PANEL
Anion gap: 11 (ref 5–15)
BUN: 62 mg/dL — ABNORMAL HIGH (ref 8–23)
CO2: 30 mmol/L (ref 22–32)
Calcium: 9.3 mg/dL (ref 8.9–10.3)
Chloride: 95 mmol/L — ABNORMAL LOW (ref 98–111)
Creatinine, Ser: 2.35 mg/dL — ABNORMAL HIGH (ref 0.44–1.00)
GFR calc Af Amer: 22 mL/min — ABNORMAL LOW (ref >60)
GFR calc non Af Amer: 19 mL/min — ABNORMAL LOW (ref >60)
Glucose, Bld: 198 mg/dL — ABNORMAL HIGH (ref 70–99)
Potassium: 4.2 mmol/L (ref 3.5–5.1)
Sodium: 136 mmol/L (ref 135–145)

## 2017-12-16 LAB — HEMOGLOBIN A1C
Hgb A1c MFr Bld: 6.7 % — ABNORMAL HIGH (ref 4.8–5.6)
Mean Plasma Glucose: 145.59 mg/dL

## 2017-12-16 LAB — GLUCOSE, CAPILLARY
Glucose-Capillary: 182 mg/dL — ABNORMAL HIGH (ref 70–99)
Glucose-Capillary: 214 mg/dL — ABNORMAL HIGH (ref 70–99)
Glucose-Capillary: 223 mg/dL — ABNORMAL HIGH (ref 70–99)
Glucose-Capillary: 212 mg/dL — ABNORMAL HIGH (ref 70–99)

## 2017-12-16 LAB — CULTURE, BLOOD (ROUTINE X 2): Special Requests: ADEQUATE

## 2017-12-16 LAB — TSH: TSH: 0.059 u[IU]/mL — ABNORMAL LOW (ref 0.350–4.500)

## 2017-12-16 MED ORDER — INSULIN ASPART 100 UNIT/ML ~~LOC~~ SOLN
0.0000 [IU] | Freq: Every day | SUBCUTANEOUS | Status: DC
  Administered 2017-12-16: 2 [IU] via SUBCUTANEOUS

## 2017-12-16 MED ORDER — INSULIN ASPART 100 UNIT/ML ~~LOC~~ SOLN
0.0000 [IU] | Freq: Three times a day (TID) | SUBCUTANEOUS | Status: DC
  Administered 2017-12-17 (×2): 2 [IU] via SUBCUTANEOUS
  Administered 2017-12-18: 5 [IU] via SUBCUTANEOUS
  Administered 2017-12-18: 8 [IU] via SUBCUTANEOUS
  Administered 2017-12-19 (×2): 3 [IU] via SUBCUTANEOUS

## 2017-12-16 NOTE — Plan of Care (Signed)
Care plans reviewed and patient is progressing.  

## 2017-12-16 NOTE — Progress Notes (Signed)
PROGRESS NOTE    Amanda Cain  PPI:[id number] DOB: 1946/04/10 DOA: 12/12/2017 PCP: Mayra Neer, MD   Brief Narrative: 72 y.o.female,w Hypothyroidism, hypertension, hyperlipidemia, dm2, ckd stage 3, CHF (EF 55-60%), apparently  Presenting 1 week history of shortness of breath associated with cough productive of greenish sputum, admitted for community-acquired pneumonia with CHF and asthma exacerbation in a count of pneumonic infiltrate on x-ray. CXR .    Assessment & Plan:   Principal Problem:   Community acquired pneumonia Active Problems:   Anemia   Type 2 diabetes mellitus with complication, without long-term current use of insulin (HCC)   Acute on chronic diastolic heart failure (HCC)   CKD (chronic kidney disease), stage III (HCC)   CHF (congestive heart failure) (HCC)   CAP (community acquired pneumonia)  1]Acute on chronic Diastolic CHF :  Echo 0/6301 ejection fraction 55 to 60% no wall motion abnormalities Moderate to severe elevated left and right heart filling pressures and pulmonary hypertension.   Continue diuresis, monitor electrolytes and renal function Cardiology consulting  2]Asthma exacerbation :  mild  exacerbating factor- CAP  steroid taper, respiratory management   3]CAP:   Continue antibiotics and supportive care   4] type 2 diabetes :   A1c 6.7% 12/16/2017  continue Tradjenta Amaryl and SSI.  5] AKI on CKD:   monitor renal function monitor lytes  consider Nephrology consultation        DVT prophylaxis: Heparin  Code Status full Family Communication:none Disposition Plan:tbd Consultants: cards   Procedures: Right heart catheterization  12/15/2017 Antimicrobials: Rocephin and azithromycin  Subjective: Her shortness of breath improving.  Objective: Vitals:   12/16/17 0423 12/16/17 0429 12/16/17 0800 12/16/17 1045  BP:  (!) 147/71    Pulse: 64  63   Resp: 14     Temp: 98.1 F (36.7 C)     TempSrc: Oral       SpO2: 97%  93% 96%  Weight:      Height:        Intake/Output Summary (Last 24 hours) at 12/16/2017 1051 Last data filed at 12/16/2017 1000 Gross per 24 hour  Intake 600 ml  Output 1600 ml  Net -1000 ml   Filed Weights   12/13/17 1453 12/14/17 1719 12/16/17 0244  Weight: 103.4 kg (227 lb 15.3 oz) 105 kg (231 lb 6.4 oz) 105 kg (231 lb 7.7 oz)    Examination:  General exam: NAD, comfortable  Respiratory system: Coarse breath sounds crackles and some wheezing auscultation. Respiratory effort normal. Cardiovascular system: S1 & S2 heard, RRR. No JVD, murmurs, rubs, gallops or clicks. No pedal edema. Gastrointestinal system: Abdomen is nondistended, soft and nontender. No organomegaly or masses felt. Normal bowel sounds heard. Central nervous system: Alert and oriented. No focal neurological deficits. Extremities: 2 plus edema Skin: No rashes, lesions or ulcers Psychiatry: Judgement and insight appear normal. Mood & affect appropriate.     Data Reviewed: I have personally reviewed following labs and imaging studies  CBC: Recent Labs  Lab 12/12/17 1813  WBC 11.4*  NEUTROABS 9.5*  HGB 9.9*  HCT 31.2*  MCV 86.9  PLT 601   Basic Metabolic Panel: Recent Labs  Lab 12/12/17 1813 12/14/17 0525 12/15/17 0520 12/16/17 0306  NA 132* 134* 132* 136  K 4.3 5.2* 4.7 4.2  CL 93* 95* 93* 95*  CO2 28 28 28 30   GLUCOSE 194* 194* 192* 198*  BUN 30* 45* 60* 62*  CREATININE 2.21* 2.24* 2.30* 2.35*  CALCIUM 9.1 9.2  9.0 9.3   GFR: Estimated Creatinine Clearance: 22.5 mL/min (A) (by C-G formula based on SCr of 2.35 mg/dL (H)). Liver Function Tests: No results for input(s): AST, ALT, ALKPHOS, BILITOT, PROT, ALBUMIN in the last 168 hours. No results for input(s): LIPASE, AMYLASE in the last 168 hours. No results for input(s): AMMONIA in the last 168 hours. Coagulation Profile: No results for input(s): INR, PROTIME in the last 168 hours. Cardiac Enzymes: Recent Labs  Lab  12/12/17 1807  TROPONINI <0.03   BNP (last 3 results) Recent Labs    01/31/17 0933 11/14/17 0812  PROBNP 11 557*   HbA1C: Recent Labs    12/16/17 0306  HGBA1C 6.7*   CBG: Recent Labs  Lab 12/14/17 1641 12/14/17 2101 12/15/17 0728 12/15/17 1241 12/15/17 2116  GLUCAP 271* 255* 173* 158* 251*   Lipid Profile: No results for input(s): CHOL, HDL, LDLCALC, TRIG, CHOLHDL, LDLDIRECT in the last 72 hours. Thyroid Function Tests: Recent Labs    12/16/17 0306  TSH 0.059*   Anemia Panel: No results for input(s): VITAMINB12, FOLATE, FERRITIN, TIBC, IRON, RETICCTPCT in the last 72 hours. Sepsis Labs: No results for input(s): PROCALCITON, LATICACIDVEN in the last 168 hours.  Recent Results (from the past 240 hour(s))  Culture, blood (routine x 2) Call MD if unable to obtain prior to antibiotics being given     Status: Abnormal   Collection Time: 12/13/17 12:46 AM  Result Value Ref Range Status   Specimen Description   Final    BLOOD RIGHT ANTECUBITAL Performed at [facility] [address] [place], [place] [address]    Special Requests   Final    BOTTLES DRAWN AEROBIC AND ANAEROBIC Blood Culture adequate volume Performed at [facility] [address] [place], [place] [address]    Culture  Setup Time   Final    AEROBIC BOTTLE ONLY GRAM POSITIVE COCCI Organism ID to follow CRITICAL RESULT CALLED TO, READ BACK BY AND VERIFIED WITH: E JACKSON PHARMD 12/14/17 0305 JDW    Culture (A)  Final    STAPHYLOCOCCUS SPECIES (COAGULASE NEGATIVE) THE SIGNIFICANCE OF ISOLATING THIS ORGANISM FROM A SINGLE SET OF BLOOD CULTURES WHEN MULTIPLE SETS ARE DRAWN IS UNCERTAIN. PLEASE NOTIFY THE MICROBIOLOGY DEPARTMENT WITHIN ONE WEEK IF SPECIATION AND SENSITIVITIES ARE REQUIRED. Performed at [facility], [place] [address] [place], [place] [address]    Report Status 12/16/2017 FINAL  Final  Blood Culture ID Panel (Reflexed)     Status: Abnormal    Collection Time: 12/13/17 12:46 AM  Result Value Ref Range Status   Enterococcus species NOT DETECTED NOT DETECTED Final   Listeria monocytogenes NOT DETECTED NOT DETECTED Final   Staphylococcus species DETECTED (A) NOT DETECTED Final    Comment: Methicillin (oxacillin) resistant coagulase negative staphylococcus. Possible blood culture contaminant (unless isolated from more than one blood culture draw or clinical case suggests pathogenicity). No antibiotic treatment is indicated for blood  culture contaminants. CRITICAL RESULT CALLED TO, READ BACK BY AND VERIFIED WITH: E JACKSON PHARMD 12/14/17 0305 JDW    Staphylococcus aureus NOT DETECTED NOT DETECTED Final   Methicillin resistance DETECTED (A) NOT DETECTED Final    Comment: CRITICAL RESULT CALLED TO, READ BACK BY AND VERIFIED WITH: E JACKSON PHARMD 12/14/17 0305 JDW    Streptococcus species NOT DETECTED NOT DETECTED Final   Streptococcus agalactiae NOT DETECTED NOT DETECTED Final   Streptococcus pneumoniae NOT DETECTED NOT DETECTED Final   Streptococcus pyogenes NOT DETECTED NOT DETECTED Final  Acinetobacter baumannii NOT DETECTED NOT DETECTED Final   Enterobacteriaceae species NOT DETECTED NOT DETECTED Final   Enterobacter cloacae complex NOT DETECTED NOT DETECTED Final   Escherichia coli NOT DETECTED NOT DETECTED Final   Klebsiella oxytoca NOT DETECTED NOT DETECTED Final   Klebsiella pneumoniae NOT DETECTED NOT DETECTED Final   Proteus species NOT DETECTED NOT DETECTED Final   Serratia marcescens NOT DETECTED NOT DETECTED Final   Haemophilus influenzae NOT DETECTED NOT DETECTED Final   Neisseria meningitidis NOT DETECTED NOT DETECTED Final   Pseudomonas aeruginosa NOT DETECTED NOT DETECTED Final   Candida albicans NOT DETECTED NOT DETECTED Final   Candida glabrata NOT DETECTED NOT DETECTED Final   Candida krusei NOT DETECTED NOT DETECTED Final   Candida parapsilosis NOT DETECTED NOT DETECTED Final   Candida tropicalis NOT  DETECTED NOT DETECTED Final    Comment: Performed at [facility], [place] [address] [place], [place] [address]  Culture, blood (routine x 2) Call MD if unable to obtain prior to antibiotics being given     Status: None (Preliminary result)   Collection Time: 12/13/17  9:48 AM  Result Value Ref Range Status   Specimen Description   Final    BLOOD RIGHT ANTECUBITAL Performed at [facility] [address] [place], [place] [address]    Special Requests   Final    BOTTLES DRAWN AEROBIC AND ANAEROBIC Blood Culture adequate volume Performed at [facility] [address] [place], [place] [address]    Culture   Final    NO GROWTH 2 DAYS Performed at [facility] [address], [place], [place] [address]    Report Status PENDING  Incomplete  Respiratory Panel by PCR     Status: None   Collection Time: 12/13/17  3:31 PM  Result Value Ref Range Status   Adenovirus NOT DETECTED NOT DETECTED Final   Coronavirus 229E NOT DETECTED NOT DETECTED Final   Coronavirus HKU1 NOT DETECTED NOT DETECTED Final   Coronavirus NL63 NOT DETECTED NOT DETECTED Final   Coronavirus OC43 NOT DETECTED NOT DETECTED Final   Metapneumovirus NOT DETECTED NOT DETECTED Final   Rhinovirus / Enterovirus NOT DETECTED NOT DETECTED Final   Influenza A NOT DETECTED NOT DETECTED Final   Influenza B NOT DETECTED NOT DETECTED Final   Parainfluenza Virus 1 NOT DETECTED NOT DETECTED Final   Parainfluenza Virus 2 NOT DETECTED NOT DETECTED Final   Parainfluenza Virus 3 NOT DETECTED NOT DETECTED Final   Parainfluenza Virus 4 NOT DETECTED NOT DETECTED Final   Respiratory Syncytial Virus NOT DETECTED NOT DETECTED Final   Bordetella pertussis NOT DETECTED NOT DETECTED Final   Chlamydophila pneumoniae NOT DETECTED NOT DETECTED Final   Mycoplasma pneumoniae NOT DETECTED NOT DETECTED Final         Radiology Studies: No results found.      Scheduled Meds: . atorvastatin   80 mg Oral Daily  . azithromycin  500 mg Oral QHS  . diltiazem  360 mg Oral Daily  . escitalopram  20 mg Oral QHS  . estradiol  0.5 mg Oral q morning - 10a  . ezetimibe  10 mg Oral Daily  . ferrous sulfate  325 mg Oral BID WC  . fluticasone  1 spray Each Nare Daily  . fluticasone furoate-vilanterol  1 puff Inhalation Daily   And  . umeclidinium bromide  1 puff Inhalation Daily  . furosemide  80 mg Intravenous Daily  . glimepiride  2  mg Oral Q breakfast  . heparin  5,000 Units Subcutaneous Q8H  . levothyroxine  250 mcg Oral QAC breakfast  . linagliptin  5 mg Oral Daily  . LORazepam  0.5 mg Oral BID  . metoprolol succinate  100 mg Oral q morning - 10a  . montelukast  10 mg Oral QHS  . multivitamin with minerals  1 tablet Oral Daily  . olopatadine  1 drop Both Eyes Daily  . paliperidone  3 mg Oral QHS  . pantoprazole  40 mg Oral Daily  . predniSONE  30 mg Oral Q breakfast  . saccharomyces boulardii  250 mg Oral BID  . sodium chloride flush  3 mL Intravenous Q12H  . sucralfate  1 g Oral BID   Continuous Infusions: . sodium chloride    . cefTRIAXone (ROCEPHIN)  IV Stopped (12/16/17 0039)     LOS: 4 days     Benito Mccreedy, MD [facility]  If 7PM-7AM, please contact night-coverage [email or web address] Password [facility] 12/16/2017, 10:51 AM

## 2017-12-16 NOTE — Progress Notes (Signed)
Subjective:  says her breathing feels better.  Her creatinine has gone up with diuresis.her weight has really not changed significantly and her BUN is higher.  No chest pain.  Right heart cath yesterday showed pulmonary hypertension with elevated wedge.  Objective:  Vital Signs in the last 24 hours: BP (!) 147/71 (BP Location: Left Arm)   Pulse 64   Temp 98.1 F (36.7 C) (Oral)   Resp 14   Ht 4\' 11"  (1.499 m)   Wt 105 kg (231 lb 7.7 oz)   SpO2 97%   BMI 46.75 kg/m   Physical Exam: Severely obese black female in no acute distress Lungs:  Clear  Cardiac:  Regular rhythm, normal S1 and S2, no S3 Abdomen:  Soft, nontender, no masses Extremities: 3+ edema present  Intake/Output from previous day: 07/19 0701 - 07/20 0700 In: 360 [P.O.:360] Out: 1601 [Urine:1600; Stool:1] Weight Filed Weights   12/13/17 1453 12/14/17 1719 12/16/17 0244  Weight: 103.4 kg (227 lb 15.3 oz) 105 kg (231 lb 6.4 oz) 105 kg (231 lb 7.7 oz)    Lab Results: Basic Metabolic Panel: Recent Labs    12/15/17 0520 12/16/17 0306  NA 132* 136  K 4.7 4.2  CL 93* 95*  CO2 28 30  GLUCOSE 192* 198*  BUN 60* 62*  CREATININE 2.30* 2.35*     BNP    Component Value Date/Time   BNP 212.5 (H) 12/12/2017 1726   Telemetry: Normal sinus rhythm personally reviewed  Assessment/Plan:  1.  Acute on chronic diastolic congestive heart failure 2.  Chronic kidney disease stage IV with worsening 3.  Morbid obesity 4.  Hypertensive heart disease not well controlled  Recommendations:  I would go ahead and get a nephrology consultation with worsening renal function.  I would add hydralazine to help control blood pressure at this time.  Watch renal function carefully.    Kerry Hough  MD [facility]  12/16/2017, 9:07 AM

## 2017-12-17 LAB — BASIC METABOLIC PANEL
Anion gap: 13 (ref 5–15)
BUN: 60 mg/dL — ABNORMAL HIGH (ref 8–23)
CO2: 28 mmol/L (ref 22–32)
Calcium: 9.1 mg/dL (ref 8.9–10.3)
Chloride: 95 mmol/L — ABNORMAL LOW (ref 98–111)
Creatinine, Ser: 2.16 mg/dL — ABNORMAL HIGH (ref 0.44–1.00)
GFR calc Af Amer: 25 mL/min — ABNORMAL LOW (ref >60)
GFR calc non Af Amer: 21 mL/min — ABNORMAL LOW (ref >60)
Glucose, Bld: 208 mg/dL — ABNORMAL HIGH (ref 70–99)
Potassium: 3.6 mmol/L (ref 3.5–5.1)
Sodium: 136 mmol/L (ref 135–145)

## 2017-12-17 LAB — GLUCOSE, CAPILLARY
Glucose-Capillary: 132 mg/dL — ABNORMAL HIGH (ref 70–99)
Glucose-Capillary: 127 mg/dL — ABNORMAL HIGH (ref 70–99)
Glucose-Capillary: 119 mg/dL — ABNORMAL HIGH (ref 70–99)
Glucose-Capillary: 164 mg/dL — ABNORMAL HIGH (ref 70–99)

## 2017-12-17 LAB — URINALYSIS, ROUTINE W REFLEX MICROSCOPIC
Bilirubin Urine: NEGATIVE
Glucose, UA: NEGATIVE mg/dL
Hgb urine dipstick: NEGATIVE
Ketones, ur: NEGATIVE mg/dL
Leukocytes, UA: NEGATIVE
Nitrite: NEGATIVE
Protein, ur: 100 mg/dL — AB
Specific Gravity, Urine: 1.01 (ref 1.005–1.030)
pH: 6 (ref 5.0–8.0)

## 2017-12-17 MED ORDER — HYDRALAZINE HCL 25 MG PO TABS
25.0000 mg | ORAL_TABLET | Freq: Three times a day (TID) | ORAL | Status: DC
  Administered 2017-12-17 – 2017-12-19 (×9): 25 mg via ORAL
  Filled 2017-12-17 (×8): qty 1

## 2017-12-17 NOTE — Progress Notes (Signed)
PROGRESS NOTE    Amanda Cain  WIO:[id number] DOB: 03-22-1946 DOA: 12/12/2017 PCP: Mayra Neer, MD   Brief Narrative: 72 y.o.female,w Hypothyroidism, hypertension, hyperlipidemia, dm2, ckd stage 3, CHF (EF 55-60%), apparently  Presenting 1 week history of shortness of breath associated with cough productive of greenish sputum, admitted for community-acquired pneumonia with CHF and asthma exacerbation in a count of pneumonic infiltrate on x-ray. CXR .    Assessment & Plan:   Principal Problem:   Community acquired pneumonia Active Problems:   Anemia   Type 2 diabetes mellitus with complication, without long-term current use of insulin (HCC)   Acute on chronic diastolic heart failure (HCC)   CKD (chronic kidney disease), stage III (HCC)   CHF (congestive heart failure) (HCC)   CAP (community acquired pneumonia)  1]Acute on chronic Diastolic CHF :  Echo 08/2681 ejection fraction 55 to 60% no wall motion abnormalities Moderate to severe elevated left and right heart filling pressures and pulmonary hypertension.   Continue diuresis, monitor electrolytes and renal function Cardiology consulting  2]Asthma exacerbation :  mild  exacerbating factor- CAP  steroid taper, respiratory management   3]CAP:   Continue antibiotics and supportive care   4] type 2 diabetes :   A1c 6.7% 12/16/2017  continue Tradjenta Amaryl and SSI.  5] AKI on CKD:   monitor renal function monitor lytes  Nephrology consulted        DVT prophylaxis: Heparin  Code Status full Family Communication:none Disposition Plan:tbd Consultants: cards   Procedures: Right heart catheterization  12/15/2017 Antimicrobials: Rocephin and azithromycin  Subjective: Feeling better, off oxygen  Objective: Vitals:   12/17/17 0310 12/17/17 0836 12/17/17 1000 12/17/17 1054  BP: (!) 177/59  (!) 160/65   Pulse: 74 67  62  Resp: 19 20 18    Temp: 97.6 F (36.4 C)  (!) 97.3 F (36.3 C)     TempSrc: Oral  Oral   SpO2: 99% 98% 98%   Weight: 103.4 kg (227 lb 14.4 oz)     Height:        Intake/Output Summary (Last 24 hours) at 12/17/2017 1232 Last data filed at 12/17/2017 0745 Gross per 24 hour  Intake 1020 ml  Output 3500 ml  Net -2480 ml   Filed Weights   12/14/17 1719 12/16/17 0244 12/17/17 0310  Weight: 105 kg (231 lb 6.4 oz) 105 kg (231 lb 7.7 oz) 103.4 kg (227 lb 14.4 oz)    Examination:  General exam: NAD, comfortable  Respiratory system: Coarse breath sounds crackles and some wheezing auscultation. Respiratory effort normal. Cardiovascular system: S1 & S2 heard, RRR. No JVD, murmurs, rubs, gallops or clicks. No pedal edema. Gastrointestinal system: Abdomen is nondistended, soft and nontender. No organomegaly or masses felt. Normal bowel sounds heard. Central nervous system: Alert and oriented. No focal neurological deficits. Extremities: 2 plus edema Skin: No rashes, lesions or ulcers Psychiatry: Judgement and insight appear normal. Mood & affect appropriate.     Data Reviewed: I have personally reviewed following labs and imaging studies  CBC: Recent Labs  Lab 12/12/17 1813  WBC 11.4*  NEUTROABS 9.5*  HGB 9.9*  HCT 31.2*  MCV 86.9  PLT 419   Basic Metabolic Panel: Recent Labs  Lab 12/12/17 1813 12/14/17 0525 12/15/17 0520 12/16/17 0306 12/17/17 0256  NA 132* 134* 132* 136 136  K 4.3 5.2* 4.7 4.2 3.6  CL 93* 95* 93* 95* 95*  CO2 28 28 28 30 28   GLUCOSE 194* 194* 192* 198* 208*  BUN 30* 45* 60* 62* 60*  CREATININE 2.21* 2.24* 2.30* 2.35* 2.16*  CALCIUM 9.1 9.2 9.0 9.3 9.1   GFR: Estimated Creatinine Clearance: 24.3 mL/min (A) (by C-G formula based on SCr of 2.16 mg/dL (H)). Liver Function Tests: No results for input(s): AST, ALT, ALKPHOS, BILITOT, PROT, ALBUMIN in the last 168 hours. No results for input(s): LIPASE, AMYLASE in the last 168 hours. No results for input(s): AMMONIA in the last 168 hours. Coagulation Profile: No  results for input(s): INR, PROTIME in the last 168 hours. Cardiac Enzymes: Recent Labs  Lab 12/12/17 1807  TROPONINI <0.03   BNP (last 3 results) Recent Labs    01/31/17 0933 11/14/17 0812  PROBNP 11 557*   HbA1C: Recent Labs    12/16/17 0306  HGBA1C 6.7*   CBG: Recent Labs  Lab 12/16/17 1105 12/16/17 1642 12/16/17 2128 12/17/17 0615 12/17/17 1116  GLUCAP 214* 223* 212* 132* 127*   Lipid Profile: No results for input(s): CHOL, HDL, LDLCALC, TRIG, CHOLHDL, LDLDIRECT in the last 72 hours. Thyroid Function Tests: Recent Labs    12/16/17 0306  TSH 0.059*   Anemia Panel: No results for input(s): VITAMINB12, FOLATE, FERRITIN, TIBC, IRON, RETICCTPCT in the last 72 hours. Sepsis Labs: No results for input(s): PROCALCITON, LATICACIDVEN in the last 168 hours.  Recent Results (from the past 240 hour(s))  Culture, blood (routine x 2) Call MD if unable to obtain prior to antibiotics being given     Status: Abnormal   Collection Time: 12/13/17 12:46 AM  Result Value Ref Range Status   Specimen Description   Final    BLOOD RIGHT ANTECUBITAL Performed at [facility] [address] [place], [place] [address]    Special Requests   Final    BOTTLES DRAWN AEROBIC AND ANAEROBIC Blood Culture adequate volume Performed at [facility] [address], [place], [place] [address]    Culture  Setup Time   Final    AEROBIC BOTTLE ONLY GRAM POSITIVE COCCI Organism ID to follow CRITICAL RESULT CALLED TO, READ BACK BY AND VERIFIED WITH: E JACKSON PHARMD 12/14/17 0305 JDW    Culture (A)  Final    STAPHYLOCOCCUS SPECIES (COAGULASE NEGATIVE) THE SIGNIFICANCE OF ISOLATING THIS ORGANISM FROM A SINGLE SET OF BLOOD CULTURES WHEN MULTIPLE SETS ARE DRAWN IS UNCERTAIN. PLEASE NOTIFY THE MICROBIOLOGY DEPARTMENT WITHIN ONE WEEK IF SPECIATION AND SENSITIVITIES ARE REQUIRED. Performed at [facility], [place] [address], [place], [place] [address]     Report Status 12/16/2017 FINAL  Final  Blood Culture ID Panel (Reflexed)     Status: Abnormal   Collection Time: 12/13/17 12:46 AM  Result Value Ref Range Status   Enterococcus species NOT DETECTED NOT DETECTED Final   Listeria monocytogenes NOT DETECTED NOT DETECTED Final   Staphylococcus species DETECTED (A) NOT DETECTED Final    Comment: Methicillin (oxacillin) resistant coagulase negative staphylococcus. Possible blood culture contaminant (unless isolated from more than one blood culture draw or clinical case suggests pathogenicity). No antibiotic treatment is indicated for blood  culture contaminants. CRITICAL RESULT CALLED TO, READ BACK BY AND VERIFIED WITH: E JACKSON PHARMD 12/14/17 0305 JDW    Staphylococcus aureus NOT DETECTED NOT DETECTED Final   Methicillin resistance DETECTED (A) NOT DETECTED Final    Comment: CRITICAL RESULT CALLED TO, READ BACK BY AND VERIFIED WITH: E JACKSON PHARMD 12/14/17 0305 JDW    Streptococcus species NOT DETECTED NOT DETECTED Final   Streptococcus agalactiae NOT DETECTED NOT DETECTED Final  Streptococcus pneumoniae NOT DETECTED NOT DETECTED Final   Streptococcus pyogenes NOT DETECTED NOT DETECTED Final   Acinetobacter baumannii NOT DETECTED NOT DETECTED Final   Enterobacteriaceae species NOT DETECTED NOT DETECTED Final   Enterobacter cloacae complex NOT DETECTED NOT DETECTED Final   Escherichia coli NOT DETECTED NOT DETECTED Final   Klebsiella oxytoca NOT DETECTED NOT DETECTED Final   Klebsiella pneumoniae NOT DETECTED NOT DETECTED Final   Proteus species NOT DETECTED NOT DETECTED Final   Serratia marcescens NOT DETECTED NOT DETECTED Final   Haemophilus influenzae NOT DETECTED NOT DETECTED Final   Neisseria meningitidis NOT DETECTED NOT DETECTED Final   Pseudomonas aeruginosa NOT DETECTED NOT DETECTED Final   Candida albicans NOT DETECTED NOT DETECTED Final   Candida glabrata NOT DETECTED NOT DETECTED Final   Candida krusei NOT DETECTED NOT  DETECTED Final   Candida parapsilosis NOT DETECTED NOT DETECTED Final   Candida tropicalis NOT DETECTED NOT DETECTED Final    Comment: Performed at [facility], [place] [address] [place], [place] [address]  Culture, blood (routine x 2) Call MD if unable to obtain prior to antibiotics being given     Status: None (Preliminary result)   Collection Time: 12/13/17  9:48 AM  Result Value Ref Range Status   Specimen Description   Final    BLOOD RIGHT ANTECUBITAL Performed at [facility] [address] [place], [place] [address]    Special Requests   Final    BOTTLES DRAWN AEROBIC AND ANAEROBIC Blood Culture adequate volume Performed at [facility] [address] [place] , [place] [address]    Culture   Final    NO GROWTH 3 DAYS Performed at [facility], [place] [address], [place],  [address]    Report Status PENDING  Incomplete  Respiratory Panel by PCR     Status: None   Collection Time: 12/13/17  3:31 PM  Result Value Ref Range Status   Adenovirus NOT DETECTED NOT DETECTED Final   Coronavirus 229E NOT DETECTED NOT DETECTED Final   Coronavirus HKU1 NOT DETECTED NOT DETECTED Final   Coronavirus NL63 NOT DETECTED NOT DETECTED Final   Coronavirus OC43 NOT DETECTED NOT DETECTED Final   Metapneumovirus NOT DETECTED NOT DETECTED Final   Rhinovirus / Enterovirus NOT DETECTED NOT DETECTED Final   Influenza A NOT DETECTED NOT DETECTED Final   Influenza B NOT DETECTED NOT DETECTED Final   Parainfluenza Virus 1 NOT DETECTED NOT DETECTED Final   Parainfluenza Virus 2 NOT DETECTED NOT DETECTED Final   Parainfluenza Virus 3 NOT DETECTED NOT DETECTED Final   Parainfluenza Virus 4 NOT DETECTED NOT DETECTED Final   Respiratory Syncytial Virus NOT DETECTED NOT DETECTED Final   Bordetella pertussis NOT DETECTED NOT DETECTED Final   Chlamydophila pneumoniae NOT DETECTED NOT DETECTED Final   Mycoplasma pneumoniae NOT DETECTED NOT DETECTED Final          Radiology Studies: No results found.      Scheduled Meds: . atorvastatin  80 mg Oral Daily  . azithromycin  500 mg Oral QHS  . diltiazem  360 mg Oral Daily  . escitalopram  20 mg Oral QHS  . estradiol  0.5 mg Oral q morning - 10a  . ezetimibe  10 mg Oral Daily  . ferrous sulfate  325 mg Oral BID WC  . fluticasone  1 spray Each Nare Daily  . fluticasone furoate-vilanterol  1 puff Inhalation Daily   And  . umeclidinium bromide  1 puff Inhalation Daily  . furosemide  80 mg Intravenous Daily  . glimepiride  2 mg Oral Q breakfast  . heparin  5,000 Units Subcutaneous Q8H  . hydrALAZINE  25 mg Oral Q8H  . insulin aspart  0-15 Units Subcutaneous TID WC  . insulin aspart  0-5 Units Subcutaneous QHS  . levothyroxine  250 mcg Oral QAC breakfast  . linagliptin  5 mg Oral Daily  . LORazepam  0.5 mg Oral BID  . metoprolol succinate  100 mg Oral q morning - 10a  . montelukast  10 mg Oral QHS  . multivitamin with minerals  1 tablet Oral Daily  . olopatadine  1 drop Both Eyes Daily  . paliperidone  3 mg Oral QHS  . pantoprazole  40 mg Oral Daily  . predniSONE  30 mg Oral Q breakfast  . saccharomyces boulardii  250 mg Oral BID  . sodium chloride flush  3 mL Intravenous Q12H  . sucralfate  1 g Oral BID   Continuous Infusions: . sodium chloride    . cefTRIAXone (ROCEPHIN)  IV 1 g (12/17/17 0053)     LOS: 5 days     Benito Mccreedy, MD [facility]  If 7PM-7AM, please contact night-coverage [email or web address] Password [facility] 12/17/2017, 12:32 PM

## 2017-12-17 NOTE — Progress Notes (Signed)
Subjective:  Says that she feels better today with less shortness of breath.  Was able to diurese some yesterday and her weight is back down again now.  She is still significantly volume overloaded.  Blood pressure remains elevated.  Hydralazine was not started yesterday.  Objective:  Vital Signs in the last 24 hours: BP (!) 177/59 (BP Location: Left Arm)   Pulse 67   Temp 97.6 F (36.4 C) (Oral)   Resp 20   Ht 4\' 11"  (1.499 m)   Wt 103.4 kg (227 lb 14.4 oz)   SpO2 98%   BMI 46.03 kg/m   Physical Exam: Severely obese black female in no acute distress Lungs:  Clear  Cardiac:  Regular rhythm, normal S1 and S2, no S3 Abdomen:  Soft, nontender, no masses Extremities: 3+ edema present  Intake/Output from previous day: 07/20 0701 - 07/21 0700 In: 1260 [P.O.:1260] Out: 4000 [Urine:4000] Weight Filed Weights   12/14/17 1719 12/16/17 0244 12/17/17 0310  Weight: 105 kg (231 lb 6.4 oz) 105 kg (231 lb 7.7 oz) 103.4 kg (227 lb 14.4 oz)    Lab Results: Basic Metabolic Panel: Recent Labs    12/16/17 0306 12/17/17 0256  NA 136 136  K 4.2 3.6  CL 95* 95*  CO2 30 28  GLUCOSE 198* 208*  BUN 62* 60*  CREATININE 2.35* 2.16*     BNP    Component Value Date/Time   BNP 212.5 (H) 12/12/2017 1726   Telemetry: Normal sinus rhythm personally reviewed  Assessment/Plan:  1.  Acute on chronic diastolic congestive heart failure some diuresis since yesterday clinically feels better 2.  Chronic kidney disease stage IV with some improvement in creatinine today. 3.  Hypertensive heart disease not well controlled 4. Morbid obesity  Recommendations:  Spoke to nephrology who will see patient today.  Hydralazine was ordered today.  Continue diuresis today.    Kerry Hough  MD [facility]  12/17/2017, 9:03 AM
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
## 2017-12-18 ENCOUNTER — Other Ambulatory Visit: Payer: Self-pay | Admitting: Cardiology

## 2017-12-18 ENCOUNTER — Encounter (HOSPITAL_COMMUNITY): Payer: Self-pay | Admitting: Internal Medicine

## 2017-12-18 DIAGNOSIS — I10 Essential (primary) hypertension: Secondary | ICD-10-CM

## 2017-12-18 DIAGNOSIS — Z5181 Encounter for therapeutic drug level monitoring: Secondary | ICD-10-CM

## 2017-12-18 DIAGNOSIS — N183 Chronic kidney disease, stage 3 (moderate): Secondary | ICD-10-CM

## 2017-12-18 LAB — CBC
HCT: 36.2 % (ref 36.0–46.0)
Hemoglobin: 11.2 g/dL — ABNORMAL LOW (ref 12.0–15.0)
MCH: 26.8 pg (ref 26.0–34.0)
MCHC: 30.9 g/dL (ref 30.0–36.0)
MCV: 86.6 fL (ref 78.0–100.0)
Platelets: 324 10*3/uL (ref 150–400)
RBC: 4.18 MIL/uL (ref 3.87–5.11)
RDW: 15.9 % — ABNORMAL HIGH (ref 11.5–15.5)
WBC: 11.7 10*3/uL — ABNORMAL HIGH (ref 4.0–10.5)

## 2017-12-18 LAB — GLUCOSE, CAPILLARY
Glucose-Capillary: 110 mg/dL — ABNORMAL HIGH (ref 70–99)
Glucose-Capillary: 232 mg/dL — ABNORMAL HIGH (ref 70–99)
Glucose-Capillary: 273 mg/dL — ABNORMAL HIGH (ref 70–99)
Glucose-Capillary: 187 mg/dL — ABNORMAL HIGH (ref 70–99)

## 2017-12-18 LAB — LEGIONELLA PNEUMOPHILA SEROGP 1 UR AG
L. pneumophila Serogp 1 Ur Ag: NEGATIVE
L. pneumophila Serogp 1 Ur Ag: NEGATIVE

## 2017-12-18 LAB — MAGNESIUM: Magnesium: 2 mg/dL (ref 1.7–2.4)

## 2017-12-18 LAB — BASIC METABOLIC PANEL
Anion gap: 12 (ref 5–15)
BUN: 51 mg/dL — ABNORMAL HIGH (ref 8–23)
CO2: 30 mmol/L (ref 22–32)
Calcium: 9 mg/dL (ref 8.9–10.3)
Chloride: 94 mmol/L — ABNORMAL LOW (ref 98–111)
Creatinine, Ser: 2.09 mg/dL — ABNORMAL HIGH (ref 0.44–1.00)
GFR calc Af Amer: 26 mL/min — ABNORMAL LOW (ref >60)
GFR calc non Af Amer: 22 mL/min — ABNORMAL LOW (ref >60)
Glucose, Bld: 101 mg/dL — ABNORMAL HIGH (ref 70–99)
Potassium: 3.5 mmol/L (ref 3.5–5.1)
Sodium: 136 mmol/L (ref 135–145)

## 2017-12-18 LAB — CULTURE, BLOOD (ROUTINE X 2)
Culture: NO GROWTH
Special Requests: ADEQUATE

## 2017-12-18 MED ORDER — FUROSEMIDE 80 MG PO TABS: 80.0000 mg | ORAL_TABLET | Freq: Two times a day (BID) | ORAL | Status: DC

## 2017-12-18 NOTE — Plan of Care (Signed)
  Problem: Education: Goal: Knowledge of General Education information will improve Description Including pain rating scale, medication(s)/side effects and non-pharmacologic comfort measures Outcome: Progressing   Problem: Health Behavior/Discharge Planning: Goal: Ability to manage health-related needs will improve Outcome: Progressing   Problem: Clinical Measurements: Goal: Ability to maintain clinical measurements within normal limits will improve Outcome: Progressing Goal: Respiratory complications will improve Outcome: Progressing Goal: Cardiovascular complication will be avoided Outcome: Progressing   Problem: Activity: Goal: Risk for activity intolerance will decrease Outcome: Progressing   Problem: Pain Managment: Goal: General experience of comfort will improve Outcome: Completed/Met

## 2017-12-18 NOTE — Progress Notes (Addendum)
Progress Note  Patient Name: Amanda Cain Date of Encounter: 12/18/2017  Primary Cardiologist: Larae Grooms, MD   Subjective   Feeling better.  Denies any SOB this am up walking in the room  Inpatient Medications    Scheduled Meds: . atorvastatin  80 mg Oral Daily  . azithromycin  500 mg Oral QHS  . diltiazem  360 mg Oral Daily  . escitalopram  20 mg Oral QHS  . estradiol  0.5 mg Oral q morning - 10a  . ezetimibe  10 mg Oral Daily  . ferrous sulfate  325 mg Oral BID WC  . fluticasone  1 spray Each Nare Daily  . fluticasone furoate-vilanterol  1 puff Inhalation Daily   And  . umeclidinium bromide  1 puff Inhalation Daily  . furosemide  80 mg Intravenous Daily  . glimepiride  2 mg Oral Q breakfast  . heparin  5,000 Units Subcutaneous Q8H  . hydrALAZINE  25 mg Oral Q8H  . insulin aspart  0-15 Units Subcutaneous TID WC  . insulin aspart  0-5 Units Subcutaneous QHS  . levothyroxine  250 mcg Oral QAC breakfast  . linagliptin  5 mg Oral Daily  . LORazepam  0.5 mg Oral BID  . metoprolol succinate  100 mg Oral q morning - 10a  . montelukast  10 mg Oral QHS  . multivitamin with minerals  1 tablet Oral Daily  . olopatadine  1 drop Both Eyes Daily  . paliperidone  3 mg Oral QHS  . pantoprazole  40 mg Oral Daily  . predniSONE  30 mg Oral Q breakfast  . saccharomyces boulardii  250 mg Oral BID  . sodium chloride flush  3 mL Intravenous Q12H  . sucralfate  1 g Oral BID   Continuous Infusions: . sodium chloride     PRN Meds: sodium chloride, albuterol, guaiFENesin-dextromethorphan, hydroxypropyl methylcellulose / hypromellose, lip balm, lip balm, ondansetron, sodium chloride flush   Vital Signs    Vitals:   12/17/17 1700 12/17/17 2228 12/18/17 0531 12/18/17 0744  BP: (!) 122/55 (!) 169/71 (!) 138/91 (!) 150/66  Pulse:  64 74 (!) 132  Resp: 19  20 18   Temp: 98.3 F (36.8 C) 97.7 F (36.5 C) 98.3 F (36.8 C) (!) 97.4 F (36.3 C)  TempSrc:  Oral Oral Oral    SpO2: 97%  97% 92%  Weight:   226 lb 12.8 oz (102.9 kg)   Height:        Intake/Output Summary (Last 24 hours) at 12/18/2017 0834 Last data filed at 12/18/2017 0540 Gross per 24 hour  Intake 940 ml  Output 2700 ml  Net -1760 ml   Filed Weights   12/16/17 0244 12/17/17 0310 12/18/17 0531  Weight: 231 lb 7.7 oz (105 kg) 227 lb 14.4 oz (103.4 kg) 226 lb 12.8 oz (102.9 kg)    Telemetry    NSR - Personally Reviewed  ECG    No new EKG to review - Personally Reviewed  Physical Exam   GEN: No acute distress.   Neck: No JVD Cardiac: RRR, no murmurs, rubs, or gallops.  Respiratory: Clear to auscultation bilaterally. GI: Soft, nontender, non-distended  MS: trace edema; No deformity. Neuro:  Nonfocal  Psych: Normal affect   Labs    Chemistry Recent Labs  Lab 12/15/17 0520 12/16/17 0306 12/17/17 0256  NA 132* 136 136  K 4.7 4.2 3.6  CL 93* 95* 95*  CO2 28 30 28   GLUCOSE 192* 198* 208*  BUN  60* 62* 60*  CREATININE 2.30* 2.35* 2.16*  CALCIUM 9.0 9.3 9.1  GFRNONAA 20* 19* 21*  GFRAA 23* 22* 25*  ANIONGAP 11 11 13      Hematology Recent Labs  Lab 12/12/17 1813  WBC 11.4*  RBC 3.59*  HGB 9.9*  HCT 31.2*  MCV 86.9  MCH 27.6  MCHC 31.7  RDW 15.8*  PLT 338    Cardiac Enzymes Recent Labs  Lab 12/12/17 1807  TROPONINI <0.03   No results for input(s): TROPIPOC in the last 168 hours.   BNP Recent Labs  Lab 12/12/17 1726  BNP 212.5*     DDimer No results for input(s): DDIMER in the last 168 hours.   Radiology    No results found.  Cardiac Studies   2D echo 11/14/2017 Study Conclusions  - Left ventricle: The cavity size was normal. Wall thickness was   increased in a pattern of mild LVH. Systolic function was normal.   The estimated ejection fraction was in the range of 55% to 60%.   Wall motion was normal; there were no regional wall motion   abnormalities. Features are consistent with a pseudonormal left   ventricular filling pattern, with  concomitant abnormal relaxation   and increased filling pressure (grade 2 diastolic dysfunction). - Mitral valve: Mildly calcified annulus. Valve area by pressure   half-time: 2 cm^2. Valve area by continuity equation (using LVOT   flow): 2.95 cm^2. - Left atrium: The atrium was mildly dilated.  Right heart cath 12/15/2017 Conclusions: 1. Moderately to severely elevated left and right heart filling pressures and pulmonary hypertension. 2. Normal Fick cardiac output/index.   Patient Profile     72 y.o. female withHypothyroidism, hypertension, hyperlipidemia, dm2, ckd stage 3, CHF (EF 55-60%), apparently  Presenting 1 week history of shortness of breath associated with cough productive of greenish sputum, admitted for community-acquired pneumonia with CHF and asthma exacerbation in a count of pneumonic infiltrate on x-ray.  Assessment & Plan    1.  Community acquired PNA with asthmatic exacerbation -on steroid taper and antibs per [facility]  2.  Acute on chronic diastolic CHF  -recent right heart cath with elevated right and left filling pressures  -she put out 3.3L yesterday and is net neg 6.3L.  -Creatinine improving with diuresis (2.35>2.16) -weight has only dropped 2lbs from admit -lungs are clear and minimal edema in legs -recommend checking with renal on discharge dose of Torsemide.  She was on 20mg  at home.  Renal recommended 80mg  Lasix PO on discharge but did not do well with Lasix and would recommend Torsemide - please confer with Renal on final dose of torsemide on discharge.  She is stable for discharge home from Cards standpoint.   3.  CKD stage 3 -creatinine improved this am (2.16) -nephrology adjusting diuretics  4.  HTN  -BP improved yesterday -continue Cardizem CD 360mg  daily, Hydralazine 25mg  TID, Toprol XL 100mg  daily -titrate BP meds as needed.  [facility] will sign off.   Medication Recommendations:  Cardizem CD 360mg  daily, Toprol XL 100mg  daily, Hydralazine  25mg  q8hrs.  Discharge torsemide dose to be determined by nephrology. Other recommendations (labs, testing, etc):  BMET in 1 week in our office Follow up as an outpatient:  followup in 2 weeks with Dr. Jenne Pane  For questions or updates, please contact [place] Please consult [email or web address] for contact info under Cardiology/STEMI.      Signed, Fransico Him, MD  12/18/2017, 8:34 AM

## 2017-12-18 NOTE — Progress Notes (Signed)
PROGRESS NOTE    Amanda Cain  RJJ:[id number] DOB: 09-25-1945 DOA: 12/12/2017 PCP: Mayra Neer, MD   Brief Narrative:  72 y.o.female,w Hypothyroidism, hypertension, hyperlipidemia, dm2, ckd stage 3, CHF (EF 55-60%), apparently  Presenting 1 week history of shortness of breath associated with cough productive of greenish sputum, admitted for community-acquired pneumonia with CHF and asthma exacerbation in Micah Barnier count of pneumonic infiltrate on x-ray.  Assessment & Plan:   Principal Problem:   Community acquired pneumonia Active Problems:   Anemia   Type 2 diabetes mellitus with complication, without long-term current use of insulin (HCC)   Acute on chronic diastolic heart failure (HCC)   CKD (chronic kidney disease), stage III (HCC)   CHF (congestive heart failure) (HCC)   CAP (community acquired pneumonia)   Acute on Chronic Diastolic CHF : Echo 0/1601 ejection fraction 55 to 60% no wall motion abnormalities S/p R heart cath with moderate to severe elevated left and right heart filling pressures and pulmonary hypertension.   Cards c/s, appreciate assistance Continue IV lasix 80 mg daily -> plan to transition to PO tomorrow (will discuss with renal dosing) I/O, daily weights  Wt Readings from Last 3 Encounters:  12/18/17 102.9 kg (226 lb 12.8 oz)  11/17/17 104.8 kg (231 lb)  08/23/17 101.2 kg (223 lb)   Asthma exacerbation : No wheezing today on exam, will d/c steroids after today  CAP: S/p 6 days ceftriaxone/azithromycin Seems improved 1/2 bcx with coag neg staph, likely contaminant  type 2 diabetes :   A1c 6.7% 12/16/2017  continue Tradjenta Amaryl and SSI (control reasonable, careful with oral hypoglycemics)  5] AKI on CKD: Baseline creatinine ~1.6-2 Peaked to 2.35 here, improving to 2.09 UA bland Nephrology recommending outpatient f/u  DVT prophylaxis: heparin Code Status: full  Family Communication: none at bedside Disposition Plan: likely  tomorrow   Consultants:   Cardiology  nephrology  Procedures:  Conclusions: 1. Moderately to severely elevated left and right heart filling pressures and pulmonary hypertension. 2. Normal Fick cardiac output/index.  Recommendations: 1. Admit to [facility] for aggressive diuresis.  Patient may require nephrology consultation if renal function worsens.  No indication for antiplatelet therapy at this time.   Study Conclusions  - Left ventricle: The cavity size was normal. Wall thickness was   increased in Noelia Lenart pattern of mild LVH. Systolic function was normal.   The estimated ejection fraction was in the range of 55% to 60%.   Wall motion was normal; there were no regional wall motion   abnormalities. Features are consistent with Lucynda Rosano pseudonormal left   ventricular filling pattern, with concomitant abnormal relaxation   and increased filling pressure (grade 2 diastolic dysfunction). - Mitral valve: Mildly calcified annulus. Valve area by pressure   half-time: 2 cm^2. Valve area by continuity equation (using LVOT   flow): 2.95 cm^2. - Left atrium: The atrium was mildly dilated.  Antimicrobials:  Anti-infectives (From admission, onward)   Start     Dose/Rate Route Frequency Ordered Stop   12/14/17 2200  azithromycin (ZITHROMAX) tablet 500 mg  Status:  Discontinued     500 mg Oral Daily at bedtime 12/14/17 1013 12/18/17 0852   12/13/17 0045  cefTRIAXone (ROCEPHIN) 1 g in sodium chloride 0.9 % 100 mL IVPB     1 g 200 mL/hr over 30 Minutes Intravenous Every 24 hours 12/13/17 0033 12/18/17 0012   12/13/17 0045  azithromycin (ZITHROMAX) 500 mg in sodium chloride 0.9 % 250 mL IVPB  Status:  Discontinued  500 mg 250 mL/hr over 60 Minutes Intravenous Every 24 hours 12/13/17 0033 12/14/17 1013        Subjective: Sitting up in chair. No complaints. No SOB. No pain. Feeling better.  Objective: Vitals:   12/17/17 1700 12/17/17 2228 12/18/17 0531 12/18/17 0744  BP: (!) 122/55  (!) 169/71 (!) 138/91 (!) 150/66  Pulse:  64 74 (!) 132  Resp: 19  20 18   Temp: 98.3 F (36.8 C) 97.7 F (36.5 C) 98.3 F (36.8 C) (!) 97.4 F (36.3 C)  TempSrc:  Oral Oral Oral  SpO2: 97%  97% 92%  Weight:   102.9 kg (226 lb 12.8 oz)   Height:        Intake/Output Summary (Last 24 hours) at 12/18/2017 0843 Last data filed at 12/18/2017 0540 Gross per 24 hour  Intake 940 ml  Output 2700 ml  Net -1760 ml   Filed Weights   12/16/17 0244 12/17/17 0310 12/18/17 0531  Weight: 105 kg (231 lb 7.7 oz) 103.4 kg (227 lb 14.4 oz) 102.9 kg (226 lb 12.8 oz)    Examination:  General exam: Appears calm and comfortable  Respiratory system: Clear to auscultation. Respiratory effort normal. Cardiovascular system: S1 & S2 heard, RRR. No JVD, murmurs, rubs, gallops or clicks. No pedal edema. Gastrointestinal system: Abdomen is nondistended, soft and nontender. No organomegaly or masses felt. Normal bowel sounds heard. Central nervous system: Alert and oriented. No focal neurological deficits. Extremities: bilateral LEE Skin: No rashes, lesions or ulcers Psychiatry: Judgement and insight appear normal. Mood & affect appropriate.     Data Reviewed: I have personally reviewed following labs and imaging studies  CBC: Recent Labs  Lab 12/12/17 1813  WBC 11.4*  NEUTROABS 9.5*  HGB 9.9*  HCT 31.2*  MCV 86.9  PLT 253   Basic Metabolic Panel: Recent Labs  Lab 12/12/17 1813 12/14/17 0525 12/15/17 0520 12/16/17 0306 12/17/17 0256  NA 132* 134* 132* 136 136  K 4.3 5.2* 4.7 4.2 3.6  CL 93* 95* 93* 95* 95*  CO2 28 28 28 30 28   GLUCOSE 194* 194* 192* 198* 208*  BUN 30* 45* 60* 62* 60*  CREATININE 2.21* 2.24* 2.30* 2.35* 2.16*  CALCIUM 9.1 9.2 9.0 9.3 9.1   GFR: Estimated Creatinine Clearance: 24.2 mL/min (Hien Perreira) (by C-G formula based on SCr of 2.16 mg/dL (H)). Liver Function Tests: No results for input(s): AST, ALT, ALKPHOS, BILITOT, PROT, ALBUMIN in the last 168 hours. No results for  input(s): LIPASE, AMYLASE in the last 168 hours. No results for input(s): AMMONIA in the last 168 hours. Coagulation Profile: No results for input(s): INR, PROTIME in the last 168 hours. Cardiac Enzymes: Recent Labs  Lab 12/12/17 1807  TROPONINI <0.03   BNP (last 3 results) Recent Labs    01/31/17 0933 11/14/17 0812  PROBNP 11 557*   HbA1C: Recent Labs    12/16/17 0306  HGBA1C 6.7*   CBG: Recent Labs  Lab 12/17/17 0615 12/17/17 1116 12/17/17 1555 12/17/17 2153 12/18/17 0634  GLUCAP 132* 127* 119* 164* 110*   Lipid Profile: No results for input(s): CHOL, HDL, LDLCALC, TRIG, CHOLHDL, LDLDIRECT in the last 72 hours. Thyroid Function Tests: Recent Labs    12/16/17 0306  TSH 0.059*   Anemia Panel: No results for input(s): VITAMINB12, FOLATE, FERRITIN, TIBC, IRON, RETICCTPCT in the last 72 hours. Sepsis Labs: No results for input(s): PROCALCITON, LATICACIDVEN in the last 168 hours.  Recent Results (from the past 240 hour(s))  Culture, blood (routine x  2) Call MD if unable to obtain prior to antibiotics being given     Status: Abnormal   Collection Time: 12/13/17 12:46 AM  Result Value Ref Range Status   Specimen Description   Final    BLOOD RIGHT ANTECUBITAL Performed at [facility] [address] [place], [place] [address]    Special Requests   Final    BOTTLES DRAWN AEROBIC AND ANAEROBIC Blood Culture adequate volume Performed at [facility] [address], [place], [place] [address]    Culture  Setup Time   Final    AEROBIC BOTTLE ONLY GRAM POSITIVE COCCI Organism ID to follow CRITICAL RESULT CALLED TO, READ BACK BY AND VERIFIED WITH: E JACKSON PHARMD 12/14/17 0305 JDW    Culture (Najai Waszak)  Final    STAPHYLOCOCCUS SPECIES (COAGULASE NEGATIVE) THE SIGNIFICANCE OF ISOLATING THIS ORGANISM FROM [place] W[place]tz SINGLE SET OF BLOOD CULTURES WHEN MULTIPLE SETS ARE DRAWN IS UNCERTAIN. PLEASE NOTIFY THE MICROBIOLOGY DEPARTMENT WITHIN ONE WEEK  IF SPECIATION AND SENSITIVITIES ARE REQUIRED. Performed at [facility], [place] [address] [place], [place] [address]    Report Status 12/16/2017 FINAL  Final  Blood Culture ID Panel (Reflexed)     Status: Abnormal   Collection Time: 12/13/17 12:46 AM  Result Value Ref Range Status   Enterococcus species NOT DETECTED NOT DETECTED Final   Listeria monocytogenes NOT DETECTED NOT DETECTED Final   Staphylococcus species DETECTED (Reata Petrov) NOT DETECTED Final    Comment: Methicillin (oxacillin) resistant coagulase negative staphylococcus. Possible blood culture contaminant (unless isolated from more than one blood culture draw or clinical case suggests pathogenicity). No antibiotic treatment is indicated for blood  culture contaminants. CRITICAL RESULT CALLED TO, READ BACK BY AND VERIFIED WITH: E JACKSON PHARMD 12/14/17 0305 JDW    Staphylococcus aureus NOT DETECTED NOT DETECTED Final   Methicillin resistance DETECTED (Iviana Blasingame) NOT DETECTED Final    Comment: CRITICAL RESULT CALLED TO, READ BACK BY AND VERIFIED WITH: E JACKSON PHARMD 12/14/17 0305 JDW    Streptococcus species NOT DETECTED NOT DETECTED Final   Streptococcus agalactiae NOT DETECTED NOT DETECTED Final   Streptococcus pneumoniae NOT DETECTED NOT DETECTED Final   Streptococcus pyogenes NOT DETECTED NOT DETECTED Final   Acinetobacter baumannii NOT DETECTED NOT DETECTED Final   Enterobacteriaceae species NOT DETECTED NOT DETECTED Final   Enterobacter cloacae complex NOT DETECTED NOT DETECTED Final   Escherichia coli NOT DETECTED NOT DETECTED Final   Klebsiella oxytoca NOT DETECTED NOT DETECTED Final   Klebsiella pneumoniae NOT DETECTED NOT DETECTED Final   Proteus species NOT DETECTED NOT DETECTED Final   Serratia marcescens NOT DETECTED NOT DETECTED Final   Haemophilus influenzae NOT DETECTED NOT DETECTED Final   Neisseria meningitidis NOT DETECTED NOT DETECTED Final   Pseudomonas aeruginosa NOT DETECTED NOT DETECTED Final   Candida  albicans NOT DETECTED NOT DETECTED Final   Candida glabrata NOT DETECTED NOT DETECTED Final   Candida krusei NOT DETECTED NOT DETECTED Final   Candida parapsilosis NOT DETECTED NOT DETECTED Final   Candida tropicalis NOT DETECTED NOT DETECTED Final    Comment: Performed at [facility], [place] [address], [place], [place] [address]  Culture, blood (routine x 2) Call MD if unable to obtain prior to antibiotics being given     Status: None (Preliminary result)   Collection Time: 12/13/17  9:48 AM  Result Value Ref Range Status   Specimen Description   Final    BLOOD RIGHT ANTECUBITAL Performed at [facility]  [place] [address], [place], [place] [address]    Special Requests   Final    BOTTLES DRAWN AEROBIC AND ANAEROBIC Blood Culture adequate volume Performed at [facility] [address] [place], [place] [address]    Culture   Final    NO GROWTH 4 DAYS Performed at [facility], [place] [address] [place], [place] [address]    Report Status PENDING  Incomplete  Respiratory Panel by PCR     Status: None   Collection Time: 12/13/17  3:31 PM  Result Value Ref Range Status   Adenovirus NOT DETECTED NOT DETECTED Final   Coronavirus 229E NOT DETECTED NOT DETECTED Final   Coronavirus HKU1 NOT DETECTED NOT DETECTED Final   Coronavirus NL63 NOT DETECTED NOT DETECTED Final   Coronavirus OC43 NOT DETECTED NOT DETECTED Final   Metapneumovirus NOT DETECTED NOT DETECTED Final   Rhinovirus / Enterovirus NOT DETECTED NOT DETECTED Final   Influenza Grettel Rames NOT DETECTED NOT DETECTED Final   Influenza B NOT DETECTED NOT DETECTED Final   Parainfluenza Virus 1 NOT DETECTED NOT DETECTED Final   Parainfluenza Virus 2 NOT DETECTED NOT DETECTED Final   Parainfluenza Virus 3 NOT DETECTED NOT DETECTED Final   Parainfluenza Virus 4 NOT DETECTED NOT DETECTED Final   Respiratory Syncytial Virus NOT DETECTED NOT DETECTED Final   Bordetella pertussis NOT DETECTED NOT  DETECTED Final   Chlamydophila pneumoniae NOT DETECTED NOT DETECTED Final   Mycoplasma pneumoniae NOT DETECTED NOT DETECTED Final         Radiology Studies: No results found.      Scheduled Meds: . atorvastatin  80 mg Oral Daily  . azithromycin  500 mg Oral QHS  . diltiazem  360 mg Oral Daily  . escitalopram  20 mg Oral QHS  . estradiol  0.5 mg Oral q morning - 10a  . ezetimibe  10 mg Oral Daily  . ferrous sulfate  325 mg Oral BID WC  . fluticasone  1 spray Each Nare Daily  . fluticasone furoate-vilanterol  1 puff Inhalation Daily   And  . umeclidinium bromide  1 puff Inhalation Daily  . furosemide  80 mg Intravenous Daily  . glimepiride  2 mg Oral Q breakfast  . heparin  5,000 Units Subcutaneous Q8H  . hydrALAZINE  25 mg Oral Q8H  . insulin aspart  0-15 Units Subcutaneous TID WC  . insulin aspart  0-5 Units Subcutaneous QHS  . levothyroxine  250 mcg Oral QAC breakfast  . linagliptin  5 mg Oral Daily  . LORazepam  0.5 mg Oral BID  . metoprolol succinate  100 mg Oral q morning - 10a  . montelukast  10 mg Oral QHS  . multivitamin with minerals  1 tablet Oral Daily  . olopatadine  1 drop Both Eyes Daily  . paliperidone  3 mg Oral QHS  . pantoprazole  40 mg Oral Daily  . predniSONE  30 mg Oral Q breakfast  . saccharomyces boulardii  250 mg Oral BID  . sodium chloride flush  3 mL Intravenous Q12H  . sucralfate  1 g Oral BID   Continuous Infusions: . sodium chloride       LOS: 6 days    Time spent: over 30 min    Fayrene Helper, MD [facility] Pager [phone]  If 7PM-7AM, please contact night-coverage [email or web address] Password [facility] 12/18/2017, 8:43 AM

## 2017-12-18 NOTE — Progress Notes (Signed)
Order for BMP in 1 week placed for medication monitoring

## 2017-12-19 LAB — BASIC METABOLIC PANEL
Anion gap: 12 (ref 5–15)
BUN: 52 mg/dL — ABNORMAL HIGH (ref 8–23)
CO2: 29 mmol/L (ref 22–32)
Calcium: 9.3 mg/dL (ref 8.9–10.3)
Chloride: 94 mmol/L — ABNORMAL LOW (ref 98–111)
Creatinine, Ser: 2.21 mg/dL — ABNORMAL HIGH (ref 0.44–1.00)
GFR calc Af Amer: 24 mL/min — ABNORMAL LOW (ref >60)
GFR calc non Af Amer: 21 mL/min — ABNORMAL LOW (ref >60)
Glucose, Bld: 155 mg/dL — ABNORMAL HIGH (ref 70–99)
Potassium: 3.6 mmol/L (ref 3.5–5.1)
Sodium: 135 mmol/L (ref 135–145)

## 2017-12-19 LAB — CBC
HCT: 36.5 % (ref 36.0–46.0)
Hemoglobin: 11.5 g/dL — ABNORMAL LOW (ref 12.0–15.0)
MCH: 27.3 pg (ref 26.0–34.0)
MCHC: 31.5 g/dL (ref 30.0–36.0)
MCV: 86.5 fL (ref 78.0–100.0)
Platelets: 354 10*3/uL (ref 150–400)
RBC: 4.22 MIL/uL (ref 3.87–5.11)
RDW: 16 % — ABNORMAL HIGH (ref 11.5–15.5)
WBC: 14.6 10*3/uL — ABNORMAL HIGH (ref 4.0–10.5)

## 2017-12-19 LAB — GLUCOSE, CAPILLARY
Glucose-Capillary: 154 mg/dL — ABNORMAL HIGH (ref 70–99)
Glucose-Capillary: 156 mg/dL — ABNORMAL HIGH (ref 70–99)

## 2017-12-19 LAB — MAGNESIUM: Magnesium: 2.2 mg/dL (ref 1.7–2.4)

## 2017-12-19 MED ORDER — TORSEMIDE 20 MG PO TABS: 40.0000 mg | ORAL_TABLET | Freq: Two times a day (BID) | ORAL | 0 refills | Status: DC

## 2017-12-19 MED ORDER — TORSEMIDE 20 MG PO TABS
50.0000 mg | ORAL_TABLET | Freq: Once | ORAL | Status: AC
  Administered 2017-12-19: 50 mg via ORAL
  Filled 2017-12-19: qty 3

## 2017-12-19 MED ORDER — HYDRALAZINE HCL 25 MG PO TABS: 25.0000 mg | ORAL_TABLET | Freq: Three times a day (TID) | ORAL | 0 refills | Status: DC

## 2017-12-19 NOTE — Discharge Summary (Signed)
Physician Discharge Summary  Amanda Cain OXB:[id number] DOB: 1945/08/25 DOA: 12/12/2017  PCP: Mayra Neer, MD  Admit date: 12/12/2017 Discharge date: 12/19/2017  Time spent: 40 minutes  Recommendations for Outpatient Follow-up:  1. Follow up outpatient CBC/CMP (attention to creatinine) 2. Discharged on 40 torsemide BID (pt taking 20 BID prior to admission), will need close follow up for kidney function 3. Follow up with cardiology as outpatient  4. Follow up with renal as outpatient (renal to arrange follow up)    Discharge Diagnoses:  Principal Problem:   Community acquired pneumonia Active Problems:   Anemia   Type 2 diabetes mellitus with complication, without long-term current use of insulin (HCC)   Acute on chronic diastolic heart failure (HCC)   CKD (chronic kidney disease), stage III (HCC)   CHF (congestive heart failure) ([place])   CAP (community acquired pneumonia)   Discharge Condition: stable  Diet recommendation: heart healthy  Filed Weights   12/17/17 0310 12/18/17 0531 12/19/17 0610  Weight: 103.4 kg (227 lb 14.4 oz) 102.9 kg (226 lb 12.8 oz) 101.8 kg (224 lb 6.4 oz)    History of present illness:  72 y.o.female,w Hypothyroidism, hypertension, hyperlipidemia, dm2, ckd stage 3, CHF (EF 55-60%), apparently Presenting 1 week history of shortness of breath associated with cough productive of greenish sputum, admitted for community-acquired pneumonia with CHF and asthma exacerbation in a count of pneumonic infiltrate on x-ray.  She was treated for CAP and HF exacerbation.  S/p RHC with elevated L and R heart filling pressures and pulm hypertension.  She was diuresed and improved.  Discharged with higher dose of torsemide.  Hospital Course:  Acute on Chronic Diastolic CHF : Echo 10/8339 ejection fraction 55 to 60% no wall motion abnormalities S/p R heart cath with moderate to severe elevated left and right heart filling pressures and pulmonary hypertension.   Cards c/s, appreciate assistance Continue IV lasix 80 mg daily -> plan to transition to PO tomorrow.  Discharge on 40 torsemide BID in addition to adding hydralazine.  Continue dilt and metop.  Follow renal function closely as outpatient. I/O, daily weights  Asthma exacerbation : No wheezing today on exam.  Steroids d/c'd.  CAP: S/p 6 days ceftriaxone/azithromycin Seems improved 1/2 bcx with coag neg staph, likely contaminant  type 2 diabetes : A1c 6.7% 12/16/2017 Resume home meds  5] AKI on CKD: Baseline creatinine ~1.6-2 Peaked to 2.35 here,2.21 on day of discharge, follow closely as outpatient UA bland Nephrology recommending outpatient f/u     Procedures: Echo Study Conclusions  - Left ventricle: The cavity size was normal. Wall thickness was   increased in a pattern of mild LVH. Systolic function was normal.   The estimated ejection fraction was in the range of 55% to 60%.   Wall motion was normal; there were no regional wall motion   abnormalities. Features are consistent with a pseudonormal left   ventricular filling pattern, with concomitant abnormal relaxation   and increased filling pressure (grade 2 diastolic dysfunction). - Mitral valve: Mildly calcified annulus. Valve area by pressure   half-time: 2 cm^2. Valve area by continuity equation (using LVOT   flow): 2.95 cm^2. - Left atrium: The atrium was mildly dilated.  Conclusions: 1. Moderately to severely elevated left and right heart filling pressures and pulmonary hypertension. 2. Normal Fick cardiac output/index.  Recommendations: 1. Admit to [facility] for aggressive diuresis.  Patient may require nephrology consultation if renal function worsens.  No indication for antiplatelet therapy at this time.  Consultations:  Cardiology  nephrology  Discharge Exam: Vitals:   12/19/17 1110 12/19/17 1207  BP: (!) 158/59 (!) 147/88  Pulse: 63 62  Resp: (!) 24 17  Temp: 97.7 F (36.5 C)  97.7 F (36.5 C)  SpO2: 96% 97%   Feeling better. Ready to go home.  General: No acute distress. Cardiovascular: Heart sounds show a regular rate, and rhythm.  Lungs: Clear to auscultation bilaterally with good air movement.  Abdomen: Soft, nontender, nondistended Neurological: Alert and oriented 3. Moves all extremities 4. Cranial nerves II through XII grossly intact. Skin: Warm and dry. No rashes or lesions. Extremities: No clubbing or cyanosis. 1+ LEE Psychiatric: Mood and affect are normal. Insight and judgment are appropriate.  Discharge Instructions   Discharge Instructions    (HEART FAILURE PATIENTS) Call MD:  Anytime you have any of the following symptoms: 1) 3 pound weight gain in 24 hours or 5 pounds in 1 week 2) shortness of breath, with or without a dry hacking cough 3) swelling in the hands, feet or stomach 4) if you have to sleep on extra pillows at night in order to breathe.   Complete by:  As directed    Avoid straining   Complete by:  As directed    Call MD for:  difficulty breathing, headache or visual disturbances   Complete by:  As directed    Call MD for:  persistant dizziness or light-headedness   Complete by:  As directed    Call MD for:  persistant nausea and vomiting   Complete by:  As directed    Call MD for:  redness, tenderness, or signs of infection (pain, swelling, redness, odor or green/yellow discharge around incision site)   Complete by:  As directed    Call MD for:  severe uncontrolled pain   Complete by:  As directed    Call MD for:  temperature >100.4   Complete by:  As directed    Diet - low sodium heart healthy   Complete by:  As directed    Diet - low sodium heart healthy   Complete by:  As directed    Discharge instructions   Complete by:  As directed    You were seen for a heart failure exacerbation.  You improved with diuresis.  Please check your weights daily.  Please call your doctor if your weight increases 3 lbs in 1 day or  5 lbs in 1 week.   You also were treated for pneumonia.  We will start you on a higher dose of torsemide to go home on.  Please follow up within the next few days with your PCP to check labs.    Please follow up with cardiology as an outpatient.  Please follow up with your PCP in a few days.  Discuss follow up with nephrology with your PCP.  Return for new, recurrent, or worsening symptoms.  Please ask your PCP to request records from this hospitalization so they know what was done and what the next steps will be.   Heart Failure patients record your daily weight using the same scale at the same time of day   Complete by:  As directed    Increase activity slowly   Complete by:  As directed    Increase activity slowly   Complete by:  As directed    STOP any activity that causes chest pain, shortness of breath, dizziness, sweating, or exessive weakness   Complete by:  As directed  Allergies as of 12/19/2017      Reactions   Aspirin    " Have an ulcer"      Medication List    TAKE these medications   acetaminophen 500 MG tablet Commonly known as:  TYLENOL Take 500 mg by mouth every 6 (six) hours as needed for mild pain.   albuterol 108 (90 Base) MCG/ACT inhaler Commonly known as:  PROVENTIL HFA;VENTOLIN HFA Inhale 2 puffs into the lungs every 4 (four) hours as needed for wheezing or shortness of breath.   albuterol (2.5 MG/3ML) 0.083% nebulizer solution Commonly known as:  PROVENTIL Take 2.5 mg by nebulization every 4 (four) hours as needed for wheezing or shortness of breath.   atorvastatin 80 MG tablet Commonly known as:  LIPITOR Take 1 tablet by mouth daily.   Biotin 10000 MCG Tabs Take 1 tablet by mouth daily.   cholecalciferol 1000 units tablet Commonly known as:  VITAMIN D Take 1,000 Units by mouth daily.   diltiazem 360 MG 24 hr capsule Commonly known as:  CARDIZEM CD Take 1 capsule by mouth daily.   escitalopram 20 MG tablet Commonly known as:   LEXAPRO Take 20 mg by mouth at bedtime.   estradiol 0.5 MG tablet Commonly known as:  ESTRACE Take 0.5 mg by mouth every morning.   ezetimibe 10 MG tablet Commonly known as:  ZETIA Take 10 mg by mouth daily.   ferrous sulfate 325 (65 FE) MG tablet Take 325 mg by mouth 2 (two) times daily with a meal.   fluticasone 50 MCG/ACT nasal spray Commonly known as:  FLONASE Place 1 spray into both nostrils daily.   glimepiride 1 MG tablet Commonly known as:  AMARYL TK 1 T PO QD WITH BRE OR THE FIRST MAIN MEAL OF THE DAY AND WITH BRE   guaiFENesin-dextromethorphan 100-10 MG/5ML syrup Commonly known as:  ROBITUSSIN DM Take 10 mLs by mouth every 4 (four) hours as needed for cough.   hydrALAZINE 25 MG tablet Commonly known as:  APRESOLINE Take 1 tablet (25 mg total) by mouth every 8 (eight) hours.   levothyroxine 125 MCG tablet Commonly known as:  SYNTHROID, LEVOTHROID Take 250 mcg by mouth daily before breakfast.   LORazepam 0.5 MG tablet Commonly known as:  ATIVAN Take 0.5-1 mg by mouth 2 (two) times daily. Take one tablet in the morning and two tablets at bedtime.   metoprolol succinate 100 MG 24 hr tablet Commonly known as:  TOPROL-XL Take 100 mg by mouth every morning.   montelukast 10 MG tablet Commonly known as:  SINGULAIR take 1 tablet by mouth at bedtime   montelukast 10 MG tablet Commonly known as:  SINGULAIR Take 1 tablet (10 mg total) by mouth at bedtime.   multivitamin with minerals Tabs tablet Take 1 tablet by mouth daily.   omeprazole 20 MG capsule Commonly known as:  PRILOSEC Take 20 mg by mouth daily.   paliperidone 3 MG 24 hr tablet Commonly known as:  INVEGA Take 3 mg by mouth at bedtime.   pantoprazole 40 MG tablet Commonly known as:  PROTONIX TAKE 1 TABLET BY MOUTH ONCE DAILY   PAZEO 0.7 % Soln Generic drug:  Olopatadine HCl Place 1 drop into both eyes daily.   potassium chloride SA 20 MEQ tablet Commonly known as:  K-DUR,KLOR-CON Take 1  tablet (20 mEq total) by mouth 2 (two) times daily.   saccharomyces boulardii 250 MG capsule Commonly known as:  FLORASTOR Take 1 capsule (250 mg total)  by mouth 2 (two) times daily.   sitaGLIPtin 50 MG tablet Commonly known as:  JANUVIA Take 50 mg by mouth daily.   sucralfate 1 g tablet Commonly known as:  CARAFATE Take 1 g by mouth 2 (two) times daily.   SYSTANE 0.4-0.3 % Soln Generic drug:  Polyethyl Glycol-Propyl Glycol Apply 1 drop to eye daily as needed (for dry eyes).   torsemide 20 MG tablet Commonly known as:  DEMADEX Take 2 tablets (40 mg total) by mouth 2 (two) times daily.   TRELEGY ELLIPTA 100-62.5-25 MCG/INH Aepb Generic drug:  Fluticasone-Umeclidin-Vilant Inhale 1 Inhaler into the lungs daily.   vitamin B-12 1000 MCG tablet Commonly known as:  CYANOCOBALAMIN Take 1,000 mcg by mouth daily.   vitamin C 500 MG tablet Commonly known as:  ASCORBIC ACID Take 500 mg by mouth daily.      Allergies  Allergen Reactions  . Aspirin     " Have an ulcer"   Follow-up Information    Jettie Booze, MD Follow up.   Specialties:  Cardiology, Radiology, Interventional Cardiology Why:  our office will call you with an appointment for repeat lab work in 1 week and hospital follow-up with Dr. Irish Lack or his PA/NP in 2 weeks.  Contact information: [address] [address] [phone]        Mayra Neer, MD Follow up.   Specialty:  Family Medicine Why:  Please follow up for an appointment within 1 week Contact information: [place] [place] [address] [phone]        [facility] Follow up.   Specialty:  [place] Why:  HHRN/PT arranged (PCS services will also continue) they will call you to set up RN/PT visits Contact information: [place] [address] [phone]            The results of significant diagnostics from this  hospitalization (including imaging, microbiology, ancillary and laboratory) are listed below for reference.    Significant Diagnostic Studies: Dg Chest 2 View  Result Date: 12/12/2017 CLINICAL DATA:  Patient is shortness of breath starting approx. 30 min ago. Hx asthma, CHF, HTN, diabetes, no other chest complaints EXAM: CHEST - 2 VIEW COMPARISON:  11/20/2017 FINDINGS: Low lung volumes are present, causing crowding of the pulmonary vasculature. Indistinct pulmonary vasculature with hazy opacities at the lung bases. Reverse lordotic projection may be exacerbating the basilar findings. Upper normal heart size. No pleural effusion. Thoracic spondylosis. IMPRESSION: 1. Indistinct opacities at the lung bases suggesting atelectasis or less likely pneumonia or aspiration. Much of this appearance might be attributable to the reverse lordotic projection and low lung volumes causing vascular crowding. 2. Upper normal heart size. Electronically Signed   By: Van Clines M.D.   On: 12/12/2017 19:03   Dg Chest 2 View  Result Date: 11/20/2017 CLINICAL DATA:  Shortness of breath. EXAM: CHEST - 2 VIEW COMPARISON:  Radiographs of July 26, 2017. FINDINGS: Stable cardiomediastinal silhouette. Mild central pulmonary vascular congestion is noted. No pneumothorax or pleural effusion is noted. Both lungs are clear. The visualized skeletal structures are unremarkable. IMPRESSION: Stable central pulmonary vascular congestion. No significant change compared to prior exam. Electronically Signed   By: Marijo Conception, M.D.   On: 11/20/2017 10:28    Microbiology: Recent Results (from the past 240 hour(s))  Culture, blood (routine x 2) Call MD if unable to obtain prior to antibiotics being given  Status: Abnormal   Collection Time: 12/13/17 12:46 AM  Result Value Ref Range Status   Specimen Description   Final    BLOOD RIGHT ANTECUBITAL Performed at [place] [address],  [place], [place] [address]    Special Requests   Final    BOTTLES DRAWN AEROBIC AND ANAEROBIC Blood Culture adequate volume Performed at [place] [address] [place], [place] [address]    Culture  Setup Time   Final    AEROBIC BOTTLE ONLY GRAM POSITIVE COCCI Organism ID to follow CRITICAL RESULT CALLED TO, READ BACK BY AND VERIFIED WITH: E JACKSON PHARMD 12/14/17 0305 JDW    Culture (A)  Final    STAPHYLOCOCCUS SPECIES (COAGULASE NEGATIVE) THE SIGNIFICANCE OF ISOLATING THIS ORGANISM FROM A SINGLE SET OF BLOOD CULTURES WHEN MULTIPLE SETS ARE DRAWN IS UNCERTAIN. PLEASE NOTIFY THE MICROBIOLOGY DEPARTMENT WITHIN ONE WEEK IF SPECIATION AND SENSITIVITIES ARE REQUIRED. Performed at [facility], [place] [address] [place], [place] [address]    Report Status 12/16/2017 FINAL  Final  Blood Culture ID Panel (Reflexed)     Status: Abnormal   Collection Time: 12/13/17 12:46 AM  Result Value Ref Range Status   Enterococcus species NOT DETECTED NOT DETECTED Final   Listeria monocytogenes NOT DETECTED NOT DETECTED Final   Staphylococcus species DETECTED (A) NOT DETECTED Final    Comment: Methicillin (oxacillin) resistant coagulase negative staphylococcus. Possible blood culture contaminant (unless isolated from more than one blood culture draw or clinical case suggests pathogenicity). No antibiotic treatment is indicated for blood  culture contaminants. CRITICAL RESULT CALLED TO, READ BACK BY AND VERIFIED WITH: E JACKSON PHARMD 12/14/17 0305 JDW    Staphylococcus aureus NOT DETECTED NOT DETECTED Final   Methicillin resistance DETECTED (A) NOT DETECTED Final    Comment: CRITICAL RESULT CALLED TO, READ BACK BY AND VERIFIED WITH: E JACKSON PHARMD 12/14/17 0305 JDW    Streptococcus species NOT DETECTED NOT DETECTED Final   Streptococcus agalactiae NOT DETECTED NOT DETECTED Final   Streptococcus pneumoniae NOT DETECTED NOT DETECTED Final   Streptococcus pyogenes NOT DETECTED NOT  DETECTED Final   Acinetobacter baumannii NOT DETECTED NOT DETECTED Final   Enterobacteriaceae species NOT DETECTED NOT DETECTED Final   Enterobacter cloacae complex NOT DETECTED NOT DETECTED Final   Escherichia coli NOT DETECTED NOT DETECTED Final   Klebsiella oxytoca NOT DETECTED NOT DETECTED Final   Klebsiella pneumoniae NOT DETECTED NOT DETECTED Final   Proteus species NOT DETECTED NOT DETECTED Final   Serratia marcescens NOT DETECTED NOT DETECTED Final   Haemophilus influenzae NOT DETECTED NOT DETECTED Final   Neisseria meningitidis NOT DETECTED NOT DETECTED Final   Pseudomonas aeruginosa NOT DETECTED NOT DETECTED Final   Candida albicans NOT DETECTED NOT DETECTED Final   Candida glabrata NOT DETECTED NOT DETECTED Final   Candida krusei NOT DETECTED NOT DETECTED Final   Candida parapsilosis NOT DETECTED NOT DETECTED Final   Candida tropicalis NOT DETECTED NOT DETECTED Final    Comment: Performed at [facility], [place] [address], [place], [place] [address]  Culture, blood (routine x 2) Call MD if unable to obtain prior to antibiotics being given     Status: None   Collection Time: 12/13/17  9:48 AM  Result Value Ref Range Status   Specimen Description   Final    BLOOD RIGHT ANTECUBITAL Performed at [place] [address] [place], [place] [address]    Special Requests   Final  BOTTLES DRAWN AEROBIC AND ANAEROBIC Blood Culture adequate volume Performed at [place] [address] [place], [place] [address]    Culture   Final    NO GROWTH 5 DAYS Performed at [facility], [place] [address], [place], [place] [address]    Report Status 12/18/2017 FINAL  Final  Respiratory Panel by PCR     Status: None   Collection Time: 12/13/17  3:31 PM  Result Value Ref Range Status   Adenovirus NOT DETECTED NOT DETECTED Final   Coronavirus 229E NOT DETECTED NOT DETECTED Final   Coronavirus HKU1 NOT DETECTED NOT DETECTED Final    Coronavirus NL63 NOT DETECTED NOT DETECTED Final   Coronavirus OC43 NOT DETECTED NOT DETECTED Final   Metapneumovirus NOT DETECTED NOT DETECTED Final   Rhinovirus / Enterovirus NOT DETECTED NOT DETECTED Final   Influenza A NOT DETECTED NOT DETECTED Final   Influenza B NOT DETECTED NOT DETECTED Final   Parainfluenza Virus 1 NOT DETECTED NOT DETECTED Final   Parainfluenza Virus 2 NOT DETECTED NOT DETECTED Final   Parainfluenza Virus 3 NOT DETECTED NOT DETECTED Final   Parainfluenza Virus 4 NOT DETECTED NOT DETECTED Final   Respiratory Syncytial Virus NOT DETECTED NOT DETECTED Final   Bordetella pertussis NOT DETECTED NOT DETECTED Final   Chlamydophila pneumoniae NOT DETECTED NOT DETECTED Final   Mycoplasma pneumoniae NOT DETECTED NOT DETECTED Final     Labs: Basic Metabolic Panel: Recent Labs  Lab 12/15/17 0520 12/16/17 0306 12/17/17 0256 12/18/17 0854 12/19/17 0622  NA 132* 136 136 136 135  K 4.7 4.2 3.6 3.5 3.6  CL 93* 95* 95* 94* 94*  CO2 28 30 28 30 29   GLUCOSE 192* 198* 208* 101* 155*  BUN 60* 62* 60* 51* 52*  CREATININE 2.30* 2.35* 2.16* 2.09* 2.21*  CALCIUM 9.0 9.3 9.1 9.0 9.3  MG  --   --   --  2.0 2.2   Liver Function Tests: No results for input(s): AST, ALT, ALKPHOS, BILITOT, PROT, ALBUMIN in the last 168 hours. No results for input(s): LIPASE, AMYLASE in the last 168 hours. No results for input(s): AMMONIA in the last 168 hours. CBC: Recent Labs  Lab 12/18/17 0854 12/19/17 0622  WBC 11.7* 14.6*  HGB 11.2* 11.5*  HCT 36.2 36.5  MCV 86.6 86.5  PLT 324 354   Cardiac Enzymes: No results for input(s): CKTOTAL, CKMB, CKMBINDEX, TROPONINI in the last 168 hours. BNP: BNP (last 3 results) Recent Labs    07/05/17 1636 12/12/17 1726  BNP 81.5 212.5*    ProBNP (last 3 results) Recent Labs    01/31/17 0933 11/14/17 0812  PROBNP 11 557*    CBG: Recent Labs  Lab 12/18/17 1113 12/18/17 1613 12/18/17 2114 12/19/17 0613 12/19/17 1107  GLUCAP 232*  273* 187* 154* 156*       Signed:  Fayrene Helper MD.  [facility] 12/19/2017, 6:59 PM

## 2017-12-19 NOTE — Progress Notes (Signed)
Discharge orders received. Patient discharge instruction, medications, appointments, and all questions answered and reviewed with patient and sibling. IV site discontinued from Right [facility] with catheter tip intact. Patient tolerated well. Ambulated to bathroom to void prior to discharge. Patient stable with no acute distress.

## 2017-12-19 NOTE — Care Management Note (Addendum)
Case Management Note Marvetta Gibbons RN, BSN Unit 4E-Case Manager [phone]  Patient Details  Name: Amanda Cain MRN: [id number] Date of Birth: 1946-05-16  Subjective/Objective:   Pt admitted with CAP                 Action/Plan: PTA pt lived at home, as [facility] aide 10 hr/week with [facility]. CM to follow for any further transition of care needs- PT eval pending  Expected Discharge Date:  12/19/17               Expected Discharge Plan:  Home/Self Care  In-House Referral:     Discharge planning Services  CM Consult  Post Acute Care Choice:  Resumption of Svcs/PTA Provider([facility] custodial level services-10hrs week.) Choice offered to:   patient  DME Arranged:   na DME Agency:     HH Arranged:   RN, PT, disease management HH Agency:   [facility]  Status of Service:  Completed  If discussed at [place] of Stay Meetings, dates discussed:    Discharge Disposition: home/home health   Additional Comments:  12/19/17- 1500- Tresia Revolorio RN, CM- PT eval completed- recommendation for [facility] services- have notified MD for orders- pt to be discharged home today- orders have been placed for [facility] for disease management and PT, spoke with pt at bedside to offer choice for [facility] services- per pt she has used [facility] in past for services however they are unable to provide [facility] at this time- pt wound like to use [facility] instead as her PCS are with them - referral called to [facility] with [facility]mings - who has accepted [facility] referral. Address and phone # confirmed in epic- PCP- Ulyses Southward, RN 12/19/2017, 10:43 AM

## 2017-12-19 NOTE — Evaluation (Signed)
Physical Therapy Evaluation Patient Details Name: Amanda Cain MRN: [id number] DOB: Feb 02, 1946 Today's Date: 12/19/2017   History of Present Illness  Pt adm with PNA. PMH - HTN, CAD, DM, CKD,   Clinical Impression  Pt doing well with mobility and ready for dc from PT standpoint. Per nurse family may be interested in pt going to ALF in the future.       Follow Up Recommendations Home health PT    Equipment Recommendations  None recommended by PT    Recommendations for Other Services       Precautions / Restrictions Precautions Precautions: None Restrictions Weight Bearing Restrictions: No      Mobility  Bed Mobility               General bed mobility comments: Pt up in chair  Transfers Overall transfer level: Modified independent                  Ambulation/Gait Ambulation/Gait assistance: Modified independent (Device/Increase time) Gait Distance (Feet): 150 Feet Assistive device: None Gait Pattern/deviations: Step-through pattern;Decreased stride length Gait velocity: decr Gait velocity interpretation: 1.31 - 2.62 ft/sec, indicative of limited community ambulator General Gait Details: Pt with steady gait. Amb on RA with SpO2 91% after amb. Pt took two standing resting breaks.   Stairs            Wheelchair Mobility    Modified Jaime (Stroke Patients Only)       Balance Overall balance assessment: Mild deficits observed, not formally tested                                           Pertinent Vitals/Pain Pain Assessment: No/denies pain    Home Living Family/patient expects to be discharged to:: Private residence Living Arrangements: Alone Available Help at Discharge: Family;Available PRN/intermittently;Personal care attendant Type of Home: Apartment Home Access: Level entry     Home Layout: One level Home Equipment: Walker - 2 wheels Additional Comments: Pt has PCA 10 hrs/wk    Prior Function Level of  Independence: Needs assistance   Gait / Transfers Assistance Needed: Independent without assistive device            Hand Dominance   Dominant Hand: Right    Extremity/Trunk Assessment   Upper Extremity Assessment Upper Extremity Assessment: Overall WFL for tasks assessed    Lower Extremity Assessment Lower Extremity Assessment: Generalized weakness       Communication   Communication: No difficulties  Cognition Arousal/Alertness: Awake/alert Behavior During Therapy: WFL for tasks assessed/performed Overall Cognitive Status: Within Functional Limits for tasks assessed                                        General Comments      Exercises     Assessment/Plan    PT Assessment All further PT needs can be met in the next venue of care  PT Problem List Decreased balance;Decreased mobility       PT Treatment Interventions      PT Goals (Current goals can be found in the Care Plan section)  Acute Rehab PT Goals PT Goal Formulation: All assessment and education complete, DC therapy    Frequency     Barriers to discharge  Co-evaluation               AM-PAC PT "6 Clicks" Daily Activity  Outcome Measure Difficulty turning over in bed (including adjusting bedclothes, sheets and blankets)?: None Difficulty moving from lying on back to sitting on the side of the bed? : None Difficulty sitting down on and standing up from a chair with arms (e.g., wheelchair, bedside commode, etc,.)?: None Help needed moving to and from a bed to chair (including a wheelchair)?: None Help needed walking in hospital room?: None Help needed climbing 3-5 steps with a railing? : A Little 6 Click Score: 23    End of Session   Activity Tolerance: Patient tolerated treatment well Patient left: in chair;with call bell/phone within reach;with family/visitor present Nurse Communication: Mobility status PT Visit Diagnosis: Other abnormalities of gait and  mobility (R26.89)    Time: 1400-1419 PT Time Calculation (min) (ACUTE ONLY): 19 min   Charges:   PT Evaluation $PT Eval Low Complexity: 1 Low     PT G CodesMarland Kitchen        [facility] [place] 12/19/2017, 2:39 PM

## 2017-12-19 NOTE — Care Management Important Message (Signed)
Important Message  Patient Details  Name: TAYLIE HELDER MRN: [id number] Date of Birth: 01-Aug-1945   Medicare Important Message Given:  Yes    Barb Merino Dubois 12/19/2017, 3:16 PM

## 2017-12-20 ENCOUNTER — Telehealth: Payer: Self-pay | Admitting: Interventional Cardiology

## 2017-12-20 NOTE — Telephone Encounter (Signed)
Returned call to patient. Patient with recent hospital admission for IV diuresis. Patient states that she feels better now, denies SOB or LEE, and was calling to schedule her follow-up. Per d/c instructions patient to follow-up in 2 weeks. Patient scheduled with Lyda Jester, PA on 01/03/18 at 2:00 PM. Instructed patient to continue taking torsemide 40 mg BID as instructed on discharge, continue to avoid salt in her diet, elevate legs, and weigh herself daily at the same time with the same amount of clothes. Instructed the patient to let us know if her Sx changed or worsened. Patient verbalized understanding and thanked me for the call.

## 2017-12-20 NOTE — Telephone Encounter (Signed)
Pt need to speak to nurse concerning if she need to come to for f/u b/c she was in the hospital. Please advise

## 2017-12-25 ENCOUNTER — Other Ambulatory Visit: Payer: Self-pay | Admitting: Pulmonary Disease

## 2017-12-25 ENCOUNTER — Telehealth: Payer: Self-pay | Admitting: Interventional Cardiology

## 2017-12-25 NOTE — Telephone Encounter (Signed)
Pt need to speak to nurse concerning her having more labs. Pt stated she didn't want to have anymore labs and need to speak to someone. Please call pt.

## 2017-12-25 NOTE — Telephone Encounter (Signed)
Spoke with the patient and she is refusing to come in for her repeat BMET as planned on 12/28/17. She says she has had so much blood drawn she just is not going to come in on  12/28/17 and then again on 01/03/18 for her appt that we can draw her blood on the day of the visit. I advised her that her kidney function tests were abnormal and we need to see if they are improving but pt says she will not be in. Advised pt that I will make a note and she agrees to keep her follow up appt on 01/03/18.

## 2017-12-28 ENCOUNTER — Other Ambulatory Visit: Payer: Medicare Other

## 2018-01-01 ENCOUNTER — Telehealth: Payer: Self-pay | Admitting: Interventional Cardiology

## 2018-01-01 ENCOUNTER — Ambulatory Visit (INDEPENDENT_AMBULATORY_CARE_PROVIDER_SITE_OTHER)
Admission: RE | Admit: 2018-01-01 | Discharge: 2018-01-01 | Disposition: A | Discharge status: Home / Self Care | Payer: Medicare Other | Source: Ambulatory Visit | Attending: Pulmonary Disease | Admitting: Pulmonary Disease

## 2018-01-01 ENCOUNTER — Ambulatory Visit (INDEPENDENT_AMBULATORY_CARE_PROVIDER_SITE_OTHER): Payer: Medicare Other | Admitting: Pulmonary Disease

## 2018-01-01 ENCOUNTER — Encounter: Payer: Self-pay | Admitting: Pulmonary Disease

## 2018-01-01 DIAGNOSIS — J4530 Mild persistent asthma, uncomplicated: Secondary | ICD-10-CM

## 2018-01-01 MED ORDER — FLUTICASONE FUROATE-VILANTEROL 200-25 MCG/INH IN AEPB: 1.0000 | INHALATION_SPRAY | Freq: Every day | RESPIRATORY_TRACT | 0 refills | Status: DC

## 2018-01-01 NOTE — Telephone Encounter (Signed)
Pt states she has gained over 5lbs since Sat. I advised her to take an extra dose (20mg ) of torsemide today and tomorrow. She is to see Lyda Jester on Wed 8/7. Pt states she was drinking Gatorade and using seasoning salt this weekend.  I have instructed pt to avoid these types of things, elevate her feet and reduce her salt intake. Pt agrees to plan, had no additional questions.

## 2018-01-01 NOTE — Telephone Encounter (Signed)
New problem   Pt c/o swelling: STAT is pt has developed SOB within 24 hours  1) How much weight have you gained and in what time span? 5lb in 2 days  2) If swelling, where is the swelling located? Leg and stomach  3) Are you currently taking a fluid pill? Yes  4) Are you currently SOB? A little  5) Do you have a log of your daily weights (if so, list)? No  6) Have you gained 3 pounds in a day or 5 pounds in a week? Yes  7) Have you traveled recently? No
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
## 2018-01-01 NOTE — Patient Instructions (Signed)
We will stop trelegy and start you on Breo 200 Continue albuterol as needed We get a chest x-ray today to make sure there is improvement in your lung opacities Follow-up in 3 months.

## 2018-01-03 ENCOUNTER — Telehealth: Payer: Self-pay | Admitting: Pulmonary Disease

## 2018-01-03 ENCOUNTER — Encounter: Payer: Self-pay | Admitting: Cardiology

## 2018-01-03 ENCOUNTER — Ambulatory Visit (INDEPENDENT_AMBULATORY_CARE_PROVIDER_SITE_OTHER): Payer: Medicare Other | Admitting: Cardiology

## 2018-01-03 VITALS — BP 130/70 | HR 63 | Ht 59.0 in | Wt 225.8 lb

## 2018-01-03 DIAGNOSIS — I5033 Acute on chronic diastolic (congestive) heart failure: Secondary | ICD-10-CM | POA: Diagnosis not present

## 2018-01-03 DIAGNOSIS — Z79899 Other long term (current) drug therapy: Secondary | ICD-10-CM

## 2018-01-03 DIAGNOSIS — I10 Essential (primary) hypertension: Secondary | ICD-10-CM

## 2018-01-03 DIAGNOSIS — N183 Chronic kidney disease, stage 3 (moderate): Secondary | ICD-10-CM

## 2018-01-03 LAB — BASIC METABOLIC PANEL
BUN/Creatinine Ratio: 15 (ref 12–28)
BUN: 34 mg/dL — ABNORMAL HIGH (ref 8–27)
CO2: 26 mmol/L (ref 20–29)
Calcium: 9.7 mg/dL (ref 8.7–10.3)
Chloride: 88 mmol/L — ABNORMAL LOW (ref 96–106)
Creatinine, Ser: 2.21 mg/dL — ABNORMAL HIGH (ref 0.57–1.00)
GFR calc Af Amer: 25 mL/min/{1.73_m2} — ABNORMAL LOW (ref >59)
GFR calc non Af Amer: 21 mL/min/{1.73_m2} — ABNORMAL LOW (ref >59)
Glucose: 84 mg/dL (ref 65–99)
Potassium: 4.2 mmol/L (ref 3.5–5.2)
Sodium: 132 mmol/L — ABNORMAL LOW (ref 134–144)

## 2018-01-03 NOTE — Telephone Encounter (Signed)
Called and spoke with patient she is aware of results and verbalized understanding. Nothing further needed.  

## 2018-01-03 NOTE — Progress Notes (Signed)
Pt has been made aware of normal result and verbalized understanding.  jw 01/03/18

## 2018-01-03 NOTE — Patient Instructions (Addendum)
Medication Instructions:   Your physician recommends that you continue on your current medications as directed. Please refer to the Current Medication list given to you today.   If you need a refill on your cardiac medications before your next appointment, please call your pharmacy.  Labwork:  BMP TODAY     Testing/Procedures: NONE ORDERED  TODAY    Follow-Up: Your physician wants you to follow-up in:  IN 6  MONTHS WITH DR.VARANASI  You will receive a reminder letter in the mail two months in advance. If you don't receive a letter, please call our office to schedule the follow-up appointment.  Any Other Special Instructions Will Be Listed Below (If Applicable).   MONITOR WEIGHT DAILY AND CONTACT OFFICE   IF  HOME SCALE WEGHT GOES PASS 225 LBS
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
## 2018-01-10 ENCOUNTER — Telehealth: Payer: Self-pay | Admitting: Pulmonary Disease

## 2018-01-10 MED ORDER — FLUTICASONE FUROATE-VILANTEROL 200-25 MCG/INH IN AEPB: 1.0000 | INHALATION_SPRAY | Freq: Every day | RESPIRATORY_TRACT | 2 refills | Status: DC

## 2018-01-10 NOTE — Telephone Encounter (Signed)
Rx for Breo 200 has been sent to preferred pharmacy. Pt is aware and voiced her understanding. Nothing further is needed.

## 2018-01-31 ENCOUNTER — Telehealth: Payer: Self-pay | Admitting: Interventional Cardiology

## 2018-01-31 NOTE — Telephone Encounter (Signed)
Returned call to patient. Patient had questions about the amount of salt and fluids she should be taking. Made patient aware that she should avoid salt in her diet and she should not exceed 2 grams daily. Made patient aware that she should not exceed 2 liters of fluid daily. Patient denies any SOB, swelling, weight gain, or any other Sx. Patient wanted to update our list and let us know that her kidney doctor increased her torsemide to 100 mg QD. Med list updated. Patient thanked me for the call.

## 2018-01-31 NOTE — Telephone Encounter (Signed)
° °  [employer] calling on behalf of the patient. Patient wants to discuss fluid restrictions and medications [facility] requesting patient be called.

## 2018-02-07 ENCOUNTER — Other Ambulatory Visit (HOSPITAL_COMMUNITY): Payer: Self-pay | Admitting: *Deleted

## 2018-02-08 ENCOUNTER — Ambulatory Visit (HOSPITAL_COMMUNITY)
Admission: RE | Admit: 2018-02-08 | Discharge: 2018-02-08 | Disposition: A | Discharge status: Home / Self Care | Payer: Medicare Other | Source: Ambulatory Visit | Attending: Nephrology | Admitting: Nephrology

## 2018-02-08 DIAGNOSIS — D631 Anemia in chronic kidney disease: Secondary | ICD-10-CM | POA: Diagnosis not present

## 2018-02-08 DIAGNOSIS — N189 Chronic kidney disease, unspecified: Secondary | ICD-10-CM | POA: Diagnosis present

## 2018-02-08 MED ORDER — SODIUM CHLORIDE 0.9 % IV SOLN
510.0000 mg | INTRAVENOUS | Status: DC
  Administered 2018-02-08: 510 mg via INTRAVENOUS
  Filled 2018-02-08: qty 17

## 2018-02-08 NOTE — Discharge Instructions (Signed)

## 2018-02-09 ENCOUNTER — Encounter (HOSPITAL_COMMUNITY): Payer: Medicare Other

## 2018-02-15 ENCOUNTER — Encounter (HOSPITAL_COMMUNITY): Payer: Medicare Other

## 2018-02-16 ENCOUNTER — Ambulatory Visit (HOSPITAL_COMMUNITY)
Admission: RE | Admit: 2018-02-16 | Discharge: 2018-02-16 | Disposition: A | Discharge status: Home / Self Care | Payer: Medicare Other | Source: Ambulatory Visit | Attending: Nephrology | Admitting: Nephrology

## 2018-02-16 DIAGNOSIS — N189 Chronic kidney disease, unspecified: Secondary | ICD-10-CM | POA: Diagnosis present

## 2018-02-16 DIAGNOSIS — D631 Anemia in chronic kidney disease: Secondary | ICD-10-CM | POA: Diagnosis present

## 2018-02-16 MED ORDER — SODIUM CHLORIDE 0.9 % IV SOLN
510.0000 mg | INTRAVENOUS | Status: DC
  Administered 2018-02-16: 510 mg via INTRAVENOUS
  Filled 2018-02-16: qty 17

## 2018-03-23 ENCOUNTER — Other Ambulatory Visit: Payer: Self-pay | Admitting: Pulmonary Disease

## 2018-03-23 ENCOUNTER — Telehealth: Payer: Self-pay | Admitting: Pulmonary Disease

## 2018-03-23 MED ORDER — PANTOPRAZOLE SODIUM 40 MG PO TBEC: 40.0000 mg | DELAYED_RELEASE_TABLET | Freq: Every day | ORAL | 1 refills | Status: DC

## 2018-03-23 NOTE — Telephone Encounter (Signed)
Called and spoke with pt who was requesting a refill of pantoprazole. I verified pt's pharmacy and sent Rx in for pt. Nothing further needed.

## 2018-03-28 ENCOUNTER — Encounter: Payer: Self-pay | Admitting: Podiatry

## 2018-03-28 ENCOUNTER — Ambulatory Visit (INDEPENDENT_AMBULATORY_CARE_PROVIDER_SITE_OTHER): Payer: Medicare Other | Admitting: Podiatry

## 2018-03-28 DIAGNOSIS — M79675 Pain in left toe(s): Secondary | ICD-10-CM | POA: Diagnosis not present

## 2018-03-28 DIAGNOSIS — M79674 Pain in right toe(s): Secondary | ICD-10-CM

## 2018-03-28 DIAGNOSIS — E119 Type 2 diabetes mellitus without complications: Secondary | ICD-10-CM

## 2018-03-28 DIAGNOSIS — B351 Tinea unguium: Secondary | ICD-10-CM

## 2018-03-28 NOTE — Progress Notes (Signed)
Complaint:  Visit Type: Patient returns to my office for continued preventative foot care services. Complaint: Patient states" my nails have grown long and thick and become painful to walk and wear shoes" Patient has been diagnosed with DM with no foot complications. The patient presents for preventative foot care services. No changes to ROS  Podiatric Exam: Vascular: dorsalis pedis and posterior tibial pulses are palpable bilateral. Capillary return is immediate. Temperature gradient is WNL. Skin turgor WNL  Sensorium: Normal Semmes Weinstein monofilament test. Normal tactile sensation bilaterally. Nail Exam: Pt has thick disfigured discolored nails with subungual debris noted bilateral entire nail hallux through fifth toenails Ulcer Exam: There is no evidence of ulcer or pre-ulcerative changes or infection. Orthopedic Exam: Muscle tone and strength are WNL. No limitations in general ROM. No crepitus or effusions noted. Foot type and digits show no abnormalities. HAV  B/L. Skin: No Porokeratosis. No infection or ulcers  Diagnosis:  Onychomycosis, , Pain in right toe, pain in left toes  Treatment & Plan Procedures and Treatment: Consent by patient was obtained for treatment procedures.   Debridement of mycotic and hypertrophic toenails, 1 through 5 bilateral and clearing of subungual debris. No ulceration, no infection noted.  Patient says she is experiencing pain on and off in her feet.  She will mention this to her doctor to see if she is a candidate for gabapentin. Return Visit-Office Procedure: Patient instructed to return to the office for a follow up visit 3 months for continued evaluation and treatment.    Gardiner Barefoot DPM

## 2018-03-31 ENCOUNTER — Emergency Department (HOSPITAL_COMMUNITY): Payer: Medicare Other

## 2018-03-31 ENCOUNTER — Emergency Department (HOSPITAL_COMMUNITY)
Admission: EM | Admit: 2018-03-31 | Discharge: 2018-03-31 | Disposition: A | Discharge status: Home / Self Care | Payer: Medicare Other | Attending: Emergency Medicine | Admitting: Emergency Medicine

## 2018-03-31 ENCOUNTER — Encounter (HOSPITAL_COMMUNITY): Payer: Self-pay | Admitting: Emergency Medicine

## 2018-03-31 DIAGNOSIS — R0602 Shortness of breath: Secondary | ICD-10-CM | POA: Diagnosis not present

## 2018-03-31 DIAGNOSIS — I130 Hypertensive heart and chronic kidney disease with heart failure and stage 1 through stage 4 chronic kidney disease, or unspecified chronic kidney disease: Secondary | ICD-10-CM | POA: Insufficient documentation

## 2018-03-31 DIAGNOSIS — R079 Chest pain, unspecified: Secondary | ICD-10-CM | POA: Diagnosis present

## 2018-03-31 DIAGNOSIS — I5033 Acute on chronic diastolic (congestive) heart failure: Secondary | ICD-10-CM | POA: Diagnosis not present

## 2018-03-31 DIAGNOSIS — Z79899 Other long term (current) drug therapy: Secondary | ICD-10-CM | POA: Insufficient documentation

## 2018-03-31 DIAGNOSIS — N183 Chronic kidney disease, stage 3 (moderate): Secondary | ICD-10-CM | POA: Diagnosis not present

## 2018-03-31 LAB — COMPREHENSIVE METABOLIC PANEL
ALT: 19 U/L (ref 0–44)
AST: 22 U/L (ref 15–41)
Albumin: 3.6 g/dL (ref 3.5–5.0)
Alkaline Phosphatase: 68 U/L (ref 38–126)
Anion gap: 7 (ref 5–15)
BUN: 30 mg/dL — ABNORMAL HIGH (ref 8–23)
CO2: 28 mmol/L (ref 22–32)
Calcium: 9.6 mg/dL (ref 8.9–10.3)
Chloride: 98 mmol/L (ref 98–111)
Creatinine, Ser: 2.05 mg/dL — ABNORMAL HIGH (ref 0.44–1.00)
GFR calc Af Amer: 26 mL/min — ABNORMAL LOW (ref >60)
GFR calc non Af Amer: 23 mL/min — ABNORMAL LOW (ref >60)
Glucose, Bld: 111 mg/dL — ABNORMAL HIGH (ref 70–99)
Potassium: 3.8 mmol/L (ref 3.5–5.1)
Sodium: 133 mmol/L — ABNORMAL LOW (ref 135–145)
Total Bilirubin: 0.9 mg/dL (ref 0.3–1.2)
Total Protein: 6.2 g/dL — ABNORMAL LOW (ref 6.5–8.1)

## 2018-03-31 LAB — URINALYSIS, ROUTINE W REFLEX MICROSCOPIC
Bacteria, UA: NONE SEEN
Bilirubin Urine: NEGATIVE
Glucose, UA: NEGATIVE mg/dL
Hgb urine dipstick: NEGATIVE
Ketones, ur: NEGATIVE mg/dL
Leukocytes, UA: NEGATIVE
Nitrite: NEGATIVE
Protein, ur: 30 mg/dL — AB
Specific Gravity, Urine: 1.004 — ABNORMAL LOW (ref 1.005–1.030)
pH: 6 (ref 5.0–8.0)

## 2018-03-31 LAB — CBC
HCT: 33.8 % — ABNORMAL LOW (ref 36.0–46.0)
Hemoglobin: 10.5 g/dL — ABNORMAL LOW (ref 12.0–15.0)
MCH: 28 pg (ref 26.0–34.0)
MCHC: 31.1 g/dL (ref 30.0–36.0)
MCV: 90.1 fL (ref 80.0–100.0)
Platelets: 337 10*3/uL (ref 150–400)
RBC: 3.75 MIL/uL — ABNORMAL LOW (ref 3.87–5.11)
RDW: 16.2 % — ABNORMAL HIGH (ref 11.5–15.5)
WBC: 8.8 10*3/uL (ref 4.0–10.5)
nRBC: 0 % (ref 0.0–0.2)

## 2018-03-31 LAB — BRAIN NATRIURETIC PEPTIDE: B Natriuretic Peptide: 157.3 pg/mL — ABNORMAL HIGH (ref 0.0–100.0)

## 2018-03-31 LAB — I-STAT TROPONIN, ED: Troponin i, poc: 0 ng/mL (ref 0.00–0.08)

## 2018-03-31 MED ORDER — FUROSEMIDE 10 MG/ML IJ SOLN
80.0000 mg | Freq: Once | INTRAMUSCULAR | Status: AC
  Administered 2018-03-31: 80 mg via INTRAVENOUS
  Filled 2018-03-31: qty 8

## 2018-03-31 MED ORDER — BENZONATATE 100 MG PO CAPS: 100.0000 mg | ORAL_CAPSULE | Freq: Three times a day (TID) | ORAL | 0 refills | Status: AC | PRN

## 2018-03-31 MED ORDER — IPRATROPIUM-ALBUTEROL 0.5-2.5 (3) MG/3ML IN SOLN
3.0000 mL | Freq: Once | RESPIRATORY_TRACT | Status: AC
  Administered 2018-03-31: 3 mL via RESPIRATORY_TRACT
  Filled 2018-03-31: qty 3
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
## 2018-03-31 NOTE — ED Notes (Addendum)
PureWick placed.

## 2018-03-31 NOTE — Discharge Instructions (Addendum)
Be sure to continue taking all medications as prescribed by your doctors. Call your cardiology and/or primary care office on Monday to schedule a follow-up appointment. Use your inhalers to help with the shortness of breath at home.   Return the emergency room if you continue to have leg swelling; weight gain; have shortness of breath that interferes with your walking or occurs at rest.  Thank you for allowing [place] to take care of you today.

## 2018-03-31 NOTE — ED Triage Notes (Addendum)
Pt states 2 weeks of cough with green mucous, nausea vomiting and diarrhea. Pt had some abdominal cramping earlier but has none currently. Pt also has nasal congestion.  Pt cough sounds dry at triage and she has swelling to bilateral legs. Pt does have a noted hx of CHF. Pt endorses the swelling has gotten worse.

## 2018-03-31 NOTE — ED Notes (Signed)
Patient verbalizes understanding of discharge instructions. Opportunity for questioning and answers were provided. Armband removed by staff, pt discharged from ED ambulatory w/ family  

## 2018-03-31 NOTE — ED Notes (Signed)
Pt O2 @ 96% RA. Pt ambulated self efficiently with no difficulty. Pt O2 sat @ 93% during ambulation. Pt returned safely to bedside O2 @ 98% RA.

## 2018-03-31 NOTE — ED Notes (Signed)
Patient transported to X-ray 

## 2018-04-03 ENCOUNTER — Encounter: Payer: Self-pay | Admitting: Physician Assistant

## 2018-04-04 ENCOUNTER — Encounter: Payer: Self-pay | Admitting: Physician Assistant

## 2018-04-04 ENCOUNTER — Ambulatory Visit (INDEPENDENT_AMBULATORY_CARE_PROVIDER_SITE_OTHER): Payer: Medicare Other | Admitting: Physician Assistant

## 2018-04-04 VITALS — BP 114/52 | HR 62 | Ht 59.0 in | Wt 223.4 lb

## 2018-04-04 DIAGNOSIS — E782 Mixed hyperlipidemia: Secondary | ICD-10-CM | POA: Diagnosis not present

## 2018-04-04 DIAGNOSIS — I5032 Chronic diastolic (congestive) heart failure: Secondary | ICD-10-CM | POA: Diagnosis not present

## 2018-04-04 DIAGNOSIS — N183 Chronic kidney disease, stage 3 unspecified: Secondary | ICD-10-CM

## 2018-04-04 DIAGNOSIS — I10 Essential (primary) hypertension: Secondary | ICD-10-CM

## 2018-04-04 NOTE — Patient Instructions (Addendum)
Your physician recommends that you continue on your current medications as directed. Please refer to the Current Medication list given to you today.   Your physician recommends that you schedule a follow-up appointment in:  3 MONTHS WITH DR Irish Lack    Two Gram Sodium Diet 2000 mg  What is Sodium? Sodium is a mineral found naturally in many foods. The most significant source of sodium in the diet is table salt, which is about 40% sodium.  Processed, convenience, and preserved foods also contain a large amount of sodium.  The body needs only 500 mg of sodium daily to function,  A normal diet provides more than enough sodium even if you do not use salt.  Why Limit Sodium? A build up of sodium in the body can cause thirst, increased blood pressure, shortness of breath, and water retention.  Decreasing sodium in the diet can reduce edema and risk of heart attack or stroke associated with high blood pressure.  Keep in mind that there are many other factors involved in these health problems.  Heredity, obesity, lack of exercise, cigarette smoking, stress and what you eat all play a role.  General Guidelines:  Do not add salt at the table or in cooking.  One teaspoon of salt contains over 2 grams of sodium.  Read food labels  Avoid processed and convenience foods  Ask your dietitian before eating any foods not dicussed in the menu planning guidelines  Consult your physician if you wish to use a salt substitute or a sodium containing medication such as antacids.  Limit milk and milk products to 16 oz (2 cups) per day.  Shopping Hints:  READ LABELS!! "Dietetic" does not necessarily mean low sodium.  Salt and other sodium ingredients are often added to foods during processing.   Menu Planning Guidelines Food Group Choose More Often Avoid  Beverages (see also the milk group All fruit juices, low-sodium, salt-free vegetables juices, low-sodium carbonated beverages Regular vegetable or tomato  juices, commercially softened water used for drinking or cooking  Breads and Cereals Enriched white, wheat, rye and pumpernickel bread, hard rolls and dinner rolls; muffins, cornbread and waffles; most dry cereals, cooked cereal without added salt; unsalted crackers and breadsticks; low sodium or homemade bread crumbs Bread, rolls and crackers with salted tops; quick breads; instant hot cereals; pancakes; commercial bread stuffing; self-rising flower and biscuit mixes; regular bread crumbs or cracker crumbs  Desserts and Sweets Desserts and sweets mad with mild should be within allowance Instant pudding mixes and cake mixes  Fats Butter or margarine; vegetable oils; unsalted salad dressings, regular salad dressings limited to 1 Tbs; light, sour and heavy cream Regular salad dressings containing bacon fat, bacon bits, and salt pork; snack dips made with instant soup mixes or processed cheese; salted nuts  Fruits Most fresh, frozen and canned fruits Fruits processed with salt or sodium-containing ingredient (some dried fruits are processed with sodium sulfites        Vegetables Fresh, frozen vegetables and low- sodium canned vegetables Regular canned vegetables, sauerkraut, pickled vegetables, and others prepared in brine; frozen vegetables in sauces; vegetables seasoned with ham, bacon or salt pork  Condiments, Sauces, Miscellaneous  Salt substitute with physician's approval; pepper, herbs, spices; vinegar, lemon or lime juice; hot pepper sauce; garlic powder, onion powder, low sodium soy sauce (1 Tbs.); low sodium condiments (ketchup, chili sauce, mustard) in limited amounts (1 tsp.) fresh ground horseradish; unsalted tortilla chips, pretzels, potato chips, popcorn, salsa (1/4 cup) Any seasoning made  with salt including garlic salt, celery salt, onion salt, and seasoned salt; sea salt, rock salt, kosher salt; meat tenderizers; monosodium glutamate; mustard, regular soy sauce, barbecue, sauce, chili  sauce, teriyaki sauce, steak sauce, Worcestershire sauce, and most flavored vinegars; canned gravy and mixes; regular condiments; salted snack foods, olives, picles, relish, horseradish sauce, catsup   Food preparation: Try these seasonings Meats:    Pork Sage, onion Serve with applesauce  Chicken Poultry seasoning, thyme, parsley Serve with cranberry sauce  Lamb Curry powder, rosemary, garlic, thyme Serve with mint sauce or jelly  Veal Marjoram, basil Serve with current jelly, cranberry sauce  Beef Pepper, bay leaf Serve with dry mustard, unsalted chive butter  Fish [place] leaf, dill Serve with unsalted lemon butter, unsalted parsley butter  Vegetables:    Asparagus Lemon juice   Broccoli Lemon juice   Carrots Mustard dressing parsley, mint, nutmeg, glazed with unsalted butter and sugar   Green beans Marjoram, lemon juice, nutmeg,dill seed   Tomatoes Basil, marjoram, onion   Spice /blend for [employer]" 4 tsp ground thyme 1 tsp ground sage 3 tsp ground rosemary 4 tsp ground marjoram   Test your knowledge 1. A product that says "Salt Free" may still contain sodium. True or False 2. Garlic Powder and Hot Pepper Sauce an be used as alternative seasonings.True or False 3. Processed foods have more sodium than fresh foods.  True or False 4. Canned Vegetables have less sodium than froze True or False  WAYS TO DECREASE YOUR SODIUM INTAKE 1. Avoid the use of added salt in cooking and at the table.  Table salt (and other prepared seasonings which contain salt) is probably one of the greatest sources of sodium in the diet.  Unsalted foods can gain flavor from the sweet, sour, and butter taste sensations of herbs and spices.  Instead of using salt for seasoning, try the following seasonings with the foods listed.  Remember: how you use them to enhance natural food flavors is limited only by your creativity... Allspice-Meat, fish, eggs, fruit, peas, red and yellow vegetables Almond Extract-Fruit  baked goods Anise Seed-Sweet breads, fruit, carrots, beets, cottage cheese, cookies (tastes like licorice) Basil-Meat, fish, eggs, vegetables, rice, vegetables salads, soups, sauces Bay Leaf-Meat, fish, stews, poultry Burnet-Salad, vegetables (cucumber-like flavor) Caraway Seed-Bread, cookies, cottage cheese, meat, vegetables, cheese, rice Cardamon-Baked goods, fruit, soups Celery Powder or seed-Salads, salad dressings, sauces, meatloaf, soup, bread.Do not use  celery salt Chervil-Meats, salads, fish, eggs, vegetables, cottage cheese (parsley-like flavor) Chili Power-Meatloaf, chicken cheese, corn, eggplant, egg dishes Chives-Salads cottage cheese, egg dishes, soups, vegetables, sauces Cilantro-Salsa, casseroles Cinnamon-Baked goods, fruit, pork, lamb, chicken, carrots Cloves-Fruit, baked goods, fish, pot roast, green beans, beets, carrots Coriander-Pastry, cookies, meat, salads, cheese (lemon-orange flavor) Cumin-Meatloaf, fish,cheese, eggs, cabbage,fruit pie (caraway flavor) [employer], fruit, eggs, fish, poultry, cottage cheese, vegetables Dill Seed-Meat, cottage cheese, poultry, vegetables, fish, salads, bread Fennel Seed-Bread, cookies, apples, pork, eggs, fish, beets, cabbage, cheese, Licorice-like flavor Garlic-(buds or powder) Salads, meat, poultry, fish, bread, butter, vegetables, potatoes.Do not  use garlic salt Ginger-Fruit, vegetables, baked goods, meat, fish, poultry Horseradish Root-Meet, vegetables, butter Lemon Juice or Extract-Vegetables, fruit, tea, baked goods, fish salads Mace-Baked goods fruit, vegetables, fish, poultry (taste like nutmeg) Maple Extract-Syrups Marjoram-Meat, chicken, fish, vegetables, breads, green salads (taste like Sage) Mint-Tea, lamb, sherbet, vegetables, desserts, carrots, cabbage Mustard, Dry or Seed-Cheese, eggs, meats, vegetables, poultry Nutmeg-Baked goods, fruit, chicken, eggs, vegetables, desserts Onion Powder-Meat, fish, poultry,  vegetables, cheese, eggs, bread, rice salads (Do not use  Onion salt) Orange Extract-Desserts, baked goods Oregano-Pasta, eggs, cheese, onions, pork, lamb, fish, chicken, vegetables, green salads Paprika-Meat, fish, poultry, eggs, cheese, vegetables Parsley Flakes-Butter, vegetables, meat fish, poultry, eggs, bread, salads (certain forms may   Contain sodium Pepper-Meat fish, poultry, vegetables, eggs Peppermint Extract-Desserts, baked goods Poppy Seed-Eggs, bread, cheese, fruit dressings, baked goods, noodles, vegetables, cottage  [employer], poultry, meat, fish, cauliflower, turnips,eggs bread Saffron-Rice, bread, veal, chicken, fish, eggs Sage-Meat, fish, poultry, onions, eggplant, tomateos, pork, stews Savory-Eggs, salads, poultry, meat, rice, vegetables, soups, pork Tarragon-Meat, poultry, fish, eggs, butter, vegetables (licorice-like flavor)  Thyme-Meat, poultry, fish, eggs, vegetables, (clover-like flavor), sauces, soups Tumeric-Salads, butter, eggs, fish, rice, vegetables (saffron-like flavor) Vanilla Extract-Baked goods, candy Vinegar-Salads, vegetables, meat marinades Walnut Extract-baked goods, candy  2. Choose your Foods Wisely   The following is a list of foods to avoid which are high in sodium:  Meats-Avoid all smoked, canned, salt cured, dried and kosher meat and fish as well as Anchovies   [employer] meats:Bologna, Liverwurst, Pastrami Canned meat or fish  Marinated herring Caviar    Pepperoni Corned Beef   Pizza Dried chipped beef  Salami Frozen breaded fish or meat Salt pork Frankfurters or hot dogs  Sardines Gefilte fish   Sausage Ham (boiled ham, Proscuitto Smoked butt    spiced ham)   Spam      TV Dinners Vegetables Canned vegetables (Regular) Relish Canned mushrooms  Sauerkraut Olives    Tomato juice Pickles  Bakery and Dessert Products Canned puddings  Cream pies Cheesecake   Decorated  cakes Cookies  Beverages/Juices Tomato juice, regular  Gatorade   V-8 vegetable juice, regular  Breads and Cereals Biscuit mixes   Salted potato chips, corn chips, pretzels Bread stuffing mixes  Salted crackers and rolls Pancake and waffle mixes Self-rising flour  Seasonings Accent    Meat sauces Barbecue sauce  Meat tenderizer Catsup    Monosodium glutamate (MSG) Celery salt   Onion salt Chili sauce   Prepared mustard Garlic salt   Salt, seasoned salt, sea salt Gravy mixes   Soy sauce Horseradish   Steak sauce Ketchup   Tartar sauce Lite salt    Teriyaki sauce Marinade mixes   Worcestershire sauce  Others Baking powder   Cocoa and cocoa mixes Baking soda   Commercial casserole mixes Candy-caramels, chocolate  Dehydrated soups    Bars, fudge,nougats  Instant rice and pasta mixes Canned broth or soup  Maraschino cherries Cheese, aged and processed cheese and cheese spreads  Learning Assessment Quiz  Indicated T (for True) or F (for False) for each of the following statements:  1. _____ Fresh fruits and vegetables and unprocessed grains are generally low in sodium 2. _____ Water may contain a considerable amount of sodium, depending on the source 3. _____ You can always tell if a food is high in sodium by tasting it 4. _____ Certain laxatives my be high in sodium and should be avoided unless prescribed   by a physician or pharmacist 5. _____ Salt substitutes may be used freely by anyone on a sodium restricted diet 6. _____ Sodium is present in table salt, food additives and as a natural component of   most foods 7. _____ Table salt is approximately 90% sodium 8. _____ Limiting sodium intake may help prevent excess fluid accumulation in the body 9. _____ On a sodium-restricted diet, seasonings such as bouillon soy sauce, and    cooking wine should be used in place of  table salt 10. _____ On an ingredient list, a product which lists monosodium glutamate as the first    ingredient is an appropriate food to include on a low sodium diet  Circle the best answer(s) to the following statements (Hint: there may be more than one correct answer)  11. On a low-sodium diet, some acceptable snack items are:    A. Olives  F. Bean dip   K. Grapefruit juice    B. Salted Pretzels G. Commercial Popcorn   L. Canned peaches    C. Carrot Sticks  H. Bouillon   M. Unsalted nuts   D. [place] fries  I. Peanut butter crackers N. Salami   E. Sweet pickles J. Tomato Juice   O. Pizza  12.  Seasonings that may be used freely on a reduced - sodium diet include   A. Lemon wedges F.Monosodium glutamate K. Celery seed    B.Soysauce   G. Pepper   L. Mustard powder   C. Sea salt  H. Cooking wine  M. Onion flakes   D. Vinegar  E. Prepared horseradish N. Salsa   E. Sage   J. Worcestershire sauce  O. Chutney

## 2018-04-04 NOTE — Progress Notes (Signed)
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   G. Pepper   L. Mustard powder   C. Sea salt  H. Cooking wine  M. Onion flakes   D. Vinegar  E. Prepared horseradish N. Salsa   E. Sage   J. Worcestershire sauce  O. Chutney  Signed, Ermalinda Barrios, PA-C  04/04/2018 [facility] [place], [place], [place]  [address] Phone: [phone]; Fax: [phone]

## 2018-04-11 ENCOUNTER — Ambulatory Visit (INDEPENDENT_AMBULATORY_CARE_PROVIDER_SITE_OTHER): Payer: Medicare Other | Admitting: Pulmonary Disease

## 2018-04-11 ENCOUNTER — Encounter: Payer: Self-pay | Admitting: Pulmonary Disease

## 2018-04-11 VITALS — BP 140/72 | HR 72 | Ht 59.0 in | Wt 225.0 lb

## 2018-04-11 DIAGNOSIS — J4530 Mild persistent asthma, uncomplicated: Secondary | ICD-10-CM

## 2018-04-11 NOTE — Progress Notes (Signed)
Amanda Cain    [id number]    02/10/1946  Primary Care Physician:Shaw, Nathen May, MD  Referring Physician: Mayra Neer, MD [place] [employer] [place], [place] [address]  Chief complaint:  Follow up for chronic cough, dyspnea.  Mild persistent asthma  HPI: 72 year old with history of hypertension, coronary artery disease, type 2 diabetes, chronic kidney disease, asthma She has recurrent admissions for respiratory failure secondary to heart failure, respiratory infection.  Her chief complaint is dyspnea with activity and occasionally at rest. She has productive cough with greenish sputum.  Initially on trelegy inhaler.  This was changed to [facility] as the diagnosis was asthma and not COPD.  - She had an admission on 11/20/16 for acute on chronic diastolic CHF. - Hospitalized for flu infection.  She was discharged on 07/08/17 after treatment with Tamiflu and prednisone taper. - Hospitalized for acute respiratory failure, community-acquired pneumonia, CHF on 7/16. Had a right heart cath with elevated left and right heart filling pressures, pulmonary hypertension.  She was diuresed with improvement in symptoms.  Discharged on a higher dose of torsemide  Pets:None Occupation:Clerical work, retired Exposures: None. No mold issues.  Smoking history:Never smoker  Interim history: Been to the ED on 03/31/2018 with diarrhea, cough, CHF.  Symptoms improved with IV Lasix and duo nebs.  Chest x-ray showed vascular congestion, no pneumonia.  She has followed up with cardiology since then.   Complains of chronic dyspnea on exertion, chronic cough, nonproductive in nature.  Continues on the [place].  Outpatient Encounter Medications as of 04/11/2018  Medication Sig  . acetaminophen (TYLENOL) 500 MG tablet Take 500 mg by mouth every 6 (six) hours as needed for mild pain.  Marland Kitchen albuterol (PROVENTIL HFA;VENTOLIN HFA) 108 (90 Base) MCG/ACT inhaler Inhale 2 puffs into the lungs every 4  (four) hours as needed for wheezing or shortness of breath.  Marland Kitchen albuterol (PROVENTIL) (2.5 MG/3ML) 0.083% nebulizer solution Take 2.5 mg by nebulization every 4 (four) hours as needed for wheezing or shortness of breath.  Marland Kitchen atorvastatin (LIPITOR) 80 MG tablet Take 1 tablet by mouth daily.  . Biotin 10000 MCG TABS Take 1 tablet by mouth daily.  . cholecalciferol (VITAMIN D) 1000 units tablet Take 1,000 Units by mouth daily.  Marland Kitchen diltiazem (CARDIZEM CD) 360 MG 24 hr capsule Take 1 capsule by mouth daily.  Marland Kitchen escitalopram (LEXAPRO) 20 MG tablet Take 20 mg by mouth at bedtime.  Marland Kitchen estradiol (ESTRACE) 0.5 MG tablet Take 0.5 mg by mouth every morning.   . ezetimibe (ZETIA) 10 MG tablet Take 10 mg by mouth daily.  . ferrous sulfate 325 (65 FE) MG tablet Take 325 mg by mouth 2 (two) times daily with a meal.   . fluticasone (FLONASE) 50 MCG/ACT nasal spray Place 1 spray into both nostrils daily.   . fluticasone furoate-vilanterol (BREO ELLIPTA) 200-25 MCG/INH AEPB Inhale 1 puff into the lungs daily.  Marland Kitchen glimepiride (AMARYL) 1 MG tablet TK 1 T PO QD WITH BRE OR THE FIRST MAIN MEAL OF THE DAY AND WITH BRE  . levothyroxine (SYNTHROID, LEVOTHROID) 125 MCG tablet Take 250 mcg by mouth daily before breakfast.   . LORazepam (ATIVAN) 0.5 MG tablet Take 0.5-1 mg by mouth 2 (two) times daily. Take one tablet in the morning and two tablets at bedtime.  . metoprolol succinate (TOPROL-XL) 100 MG 24 hr tablet Take 100 mg by mouth every morning.   . montelukast (SINGULAIR) 10 MG tablet take 1  tablet by mouth at bedtime  . Multiple Vitamin (MULITIVITAMIN WITH MINERALS) TABS Take 1 tablet by mouth daily.  . paliperidone (INVEGA) 3 MG 24 hr tablet Take 3 mg by mouth at bedtime.  . pantoprazole (PROTONIX) 40 MG tablet Take 1 tablet (40 mg total) by mouth daily.  Marland Kitchen PAZEO 0.7 % SOLN Place 1 drop into both eyes daily.  Vladimir Faster Glycol-Propyl Glycol (SYSTANE) 0.4-0.3 % SOLN Apply 1 drop to eye daily as needed (for dry eyes).  .  potassium chloride SA (K-DUR,KLOR-CON) 20 MEQ tablet Take 1 tablet (20 mEq total) by mouth 2 (two) times daily.  Marland Kitchen saccharomyces boulardii (FLORASTOR) 250 MG capsule Take 1 capsule (250 mg total) by mouth 2 (two) times daily.  . sitaGLIPtin (JANUVIA) 50 MG tablet Take 50 mg by mouth daily.  . sucralfate (CARAFATE) 1 G tablet Take 1 g by mouth 2 (two) times daily.  Marland Kitchen torsemide (DEMADEX) 100 MG tablet Take 100 mg by mouth daily.  . vitamin B-12 (CYANOCOBALAMIN) 1000 MCG tablet Take 1,000 mcg by mouth daily.  . vitamin C (ASCORBIC ACID) 500 MG tablet Take 500 mg by mouth daily.  . [DISCONTINUED] guaiFENesin-dextromethorphan (ROBITUSSIN DM) 100-10 MG/5ML syrup Take 10 mLs by mouth every 4 (four) hours as needed for cough.  . [DISCONTINUED] pantoprazole (PROTONIX) 40 MG tablet TAKE 1 TABLET BY MOUTH ONCE DAILY  . hydrALAZINE (APRESOLINE) 25 MG tablet Take 1 tablet (25 mg total) by mouth every 8 (eight) hours.  . Promethazine-Codeine 6.25-10 MG/5ML SOLN TAKE 1 TEASPOON AT BEDTIME AS NEEDED   No facility-administered encounter medications on file as of 04/11/2018.     Allergies as of 04/11/2018 - Review Complete 04/11/2018  Allergen Reaction Noted  . Aspirin  10/31/2012   Physical Exam: Blood pressure 112/60, pulse 87, height 4\' 11"  (1.499 m), weight 226 lb (102.5 kg), SpO2 97 %. Gen:      No acute distress HEENT:  EOMI, sclera anicteric Neck:     No masses; no thyromegaly Lungs:    Clear to auscultation bilaterally; normal respiratory effort CV:         Regular rate and rhythm; no murmurs Abd:      + bowel sounds; soft, non-tender; no palpable masses, no distension Ext:    No edema; adequate peripheral perfusion Skin:      Warm and dry; no rash Neuro: alert and oriented x 3 Psych: normal mood and affect  Data Reviewed: Imaging  Chest x-ray 11/17/16-CHF with interstitial edema CT neck 02/19/16-lung apices appear clear CT abdomen 11/19/11-lung bases appear clear. Chest x-ray 12/12/17-  bibasilar atelectasis, basilar opacities.   Chest x-ray 03/31/2018- cardiomegaly, mild vascular congestion. I have reviewed the images personally.  PFTs 04/05/17-unable to complete FENO 02/27/17- unable to complete FENO 08/23/17-unable to complete  Cardiac Echo 11/17/16 LVEF 60 to 99%, grade 2 diastolic dysfunction, high ventricular filling pressures. - Left ventricle: The cavity size was normal. There was moderate  Echo 11/14/17- Mild LVH, LVEF 37-16%, grade 2 diastolic dysfunction  Right heart catheterization 12/15/2017 Moderately to severely elevated left and right heart filling pressures and pulmonary hypertension. Normal Fick cardiac output/index.  RA (mean): 20 mmHg RV (S/EDP): 68/20 mmHg PA (S/D, mean): 66/30 (42) mmHg PCWP (mean): 30 mmHg  Ao sat: 99% (vial pulseoximeter) PA sat: 63%  Fick CO: 5.4 L/min Fick CI: 2.7 L/min/m^2  Assessment:  Mild persistent asthma Suspect asthma, reactive airway disease exacerbated by allergies, postnasal drip and GERD We cannot confirm for sure as she is unable  to complete, PFTs or FENO in spite of multiple attempts There is no suspicion for COPD as she is a non-smoker.  Continue Breo, albuterol as needed.  Pulmonary hypertension, diastolic heart failure, CKD Follow-up with cardiology and renal  Allergies, post nasal drip Continue Singulair, Flonase  GERD Protonix  Plan/Recommendations: - Continue Breo - Continue Singulair, Flonase, Protonix  Marshell Garfinkel MD [facility] 04/11/2018, 10:58 AM  CC: Mayra Neer, MD

## 2018-04-11 NOTE — Patient Instructions (Signed)
Continue inhalers as prescribed Follow-up in 6 months.

## 2018-04-14 ENCOUNTER — Other Ambulatory Visit: Payer: Self-pay | Admitting: Pulmonary Disease

## 2018-05-12 ENCOUNTER — Other Ambulatory Visit: Payer: Self-pay

## 2018-05-12 ENCOUNTER — Inpatient Hospital Stay (HOSPITAL_COMMUNITY)
Admission: EM | Admit: 2018-05-12 | Discharge: 2018-05-15 | DRG: 189 | Disposition: A | Discharge status: Home / Self Care | Payer: Medicare Other | Attending: Internal Medicine | Admitting: Internal Medicine

## 2018-05-12 ENCOUNTER — Emergency Department (HOSPITAL_COMMUNITY): Payer: Medicare Other

## 2018-05-12 DIAGNOSIS — I509 Heart failure, unspecified: Secondary | ICD-10-CM

## 2018-05-12 DIAGNOSIS — Z7951 Long term (current) use of inhaled steroids: Secondary | ICD-10-CM

## 2018-05-12 DIAGNOSIS — Z820 Family history of epilepsy and other diseases of the nervous system: Secondary | ICD-10-CM

## 2018-05-12 DIAGNOSIS — Z794 Long term (current) use of insulin: Secondary | ICD-10-CM

## 2018-05-12 DIAGNOSIS — Z79899 Other long term (current) drug therapy: Secondary | ICD-10-CM

## 2018-05-12 DIAGNOSIS — I2510 Atherosclerotic heart disease of native coronary artery without angina pectoris: Secondary | ICD-10-CM | POA: Diagnosis present

## 2018-05-12 DIAGNOSIS — I5033 Acute on chronic diastolic (congestive) heart failure: Secondary | ICD-10-CM | POA: Diagnosis present

## 2018-05-12 DIAGNOSIS — R0789 Other chest pain: Secondary | ICD-10-CM | POA: Diagnosis not present

## 2018-05-12 DIAGNOSIS — I130 Hypertensive heart and chronic kidney disease with heart failure and stage 1 through stage 4 chronic kidney disease, or unspecified chronic kidney disease: Secondary | ICD-10-CM | POA: Diagnosis present

## 2018-05-12 DIAGNOSIS — R0602 Shortness of breath: Secondary | ICD-10-CM | POA: Diagnosis not present

## 2018-05-12 DIAGNOSIS — J9621 Acute and chronic respiratory failure with hypoxia: Principal | ICD-10-CM | POA: Diagnosis present

## 2018-05-12 DIAGNOSIS — Z823 Family history of stroke: Secondary | ICD-10-CM

## 2018-05-12 DIAGNOSIS — N183 Chronic kidney disease, stage 3 unspecified: Secondary | ICD-10-CM | POA: Diagnosis present

## 2018-05-12 DIAGNOSIS — J9611 Chronic respiratory failure with hypoxia: Secondary | ICD-10-CM | POA: Diagnosis present

## 2018-05-12 DIAGNOSIS — D649 Anemia, unspecified: Secondary | ICD-10-CM | POA: Diagnosis present

## 2018-05-12 DIAGNOSIS — Z90710 Acquired absence of both cervix and uterus: Secondary | ICD-10-CM

## 2018-05-12 DIAGNOSIS — Z8042 Family history of malignant neoplasm of prostate: Secondary | ICD-10-CM

## 2018-05-12 DIAGNOSIS — R0902 Hypoxemia: Secondary | ICD-10-CM

## 2018-05-12 DIAGNOSIS — E119 Type 2 diabetes mellitus without complications: Secondary | ICD-10-CM

## 2018-05-12 DIAGNOSIS — Z9111 Patient's noncompliance with dietary regimen: Secondary | ICD-10-CM

## 2018-05-12 DIAGNOSIS — Z8041 Family history of malignant neoplasm of ovary: Secondary | ICD-10-CM

## 2018-05-12 DIAGNOSIS — N184 Chronic kidney disease, stage 4 (severe): Secondary | ICD-10-CM | POA: Diagnosis present

## 2018-05-12 DIAGNOSIS — E785 Hyperlipidemia, unspecified: Secondary | ICD-10-CM | POA: Diagnosis present

## 2018-05-12 DIAGNOSIS — Z79890 Hormone replacement therapy (postmenopausal): Secondary | ICD-10-CM

## 2018-05-12 DIAGNOSIS — R040 Epistaxis: Secondary | ICD-10-CM | POA: Diagnosis present

## 2018-05-12 DIAGNOSIS — E039 Hypothyroidism, unspecified: Secondary | ICD-10-CM | POA: Diagnosis present

## 2018-05-12 DIAGNOSIS — E7800 Pure hypercholesterolemia, unspecified: Secondary | ICD-10-CM | POA: Diagnosis present

## 2018-05-12 DIAGNOSIS — Z8249 Family history of ischemic heart disease and other diseases of the circulatory system: Secondary | ICD-10-CM

## 2018-05-12 DIAGNOSIS — E1122 Type 2 diabetes mellitus with diabetic chronic kidney disease: Secondary | ICD-10-CM | POA: Diagnosis present

## 2018-05-12 DIAGNOSIS — K219 Gastro-esophageal reflux disease without esophagitis: Secondary | ICD-10-CM | POA: Diagnosis present

## 2018-05-12 DIAGNOSIS — E118 Type 2 diabetes mellitus with unspecified complications: Secondary | ICD-10-CM | POA: Diagnosis present

## 2018-05-12 DIAGNOSIS — E669 Obesity, unspecified: Secondary | ICD-10-CM | POA: Diagnosis present

## 2018-05-12 DIAGNOSIS — J9601 Acute respiratory failure with hypoxia: Secondary | ICD-10-CM

## 2018-05-12 DIAGNOSIS — F329 Major depressive disorder, single episode, unspecified: Secondary | ICD-10-CM | POA: Diagnosis present

## 2018-05-12 DIAGNOSIS — I272 Pulmonary hypertension, unspecified: Secondary | ICD-10-CM | POA: Diagnosis present

## 2018-05-12 LAB — URINALYSIS, ROUTINE W REFLEX MICROSCOPIC
Bilirubin Urine: NEGATIVE
Glucose, UA: NEGATIVE mg/dL
Hgb urine dipstick: NEGATIVE
Ketones, ur: NEGATIVE mg/dL
Leukocytes, UA: NEGATIVE
Nitrite: NEGATIVE
Protein, ur: 100 mg/dL — AB
Specific Gravity, Urine: 1.01 (ref 1.005–1.030)
pH: 6 (ref 5.0–8.0)

## 2018-05-12 LAB — BASIC METABOLIC PANEL
Anion gap: 11 (ref 5–15)
BUN: 37 mg/dL — ABNORMAL HIGH (ref 8–23)
CO2: 29 mmol/L (ref 22–32)
Calcium: 9.3 mg/dL (ref 8.9–10.3)
Chloride: 91 mmol/L — ABNORMAL LOW (ref 98–111)
Creatinine, Ser: 2.25 mg/dL — ABNORMAL HIGH (ref 0.44–1.00)
GFR calc Af Amer: 24 mL/min — ABNORMAL LOW (ref >60)
GFR calc non Af Amer: 21 mL/min — ABNORMAL LOW (ref >60)
Glucose, Bld: 108 mg/dL — ABNORMAL HIGH (ref 70–99)
Potassium: 5.5 mmol/L — ABNORMAL HIGH (ref 3.5–5.1)
Sodium: 131 mmol/L — ABNORMAL LOW (ref 135–145)

## 2018-05-12 LAB — HEPATIC FUNCTION PANEL
ALT: 21 U/L (ref 0–44)
AST: 21 U/L (ref 15–41)
Albumin: 3.3 g/dL — ABNORMAL LOW (ref 3.5–5.0)
Alkaline Phosphatase: 56 U/L (ref 38–126)
Bilirubin, Direct: 0.1 mg/dL (ref 0.0–0.2)
Indirect Bilirubin: 0.7 mg/dL (ref 0.3–0.9)
Total Bilirubin: 0.8 mg/dL (ref 0.3–1.2)
Total Protein: 6.7 g/dL (ref 6.5–8.1)

## 2018-05-12 LAB — I-STAT TROPONIN, ED: Troponin i, poc: 0 ng/mL (ref 0.00–0.08)

## 2018-05-12 LAB — CBC
HCT: 33.1 % — ABNORMAL LOW (ref 36.0–46.0)
Hemoglobin: 9.9 g/dL — ABNORMAL LOW (ref 12.0–15.0)
MCH: 26.8 pg (ref 26.0–34.0)
MCHC: 29.9 g/dL — ABNORMAL LOW (ref 30.0–36.0)
MCV: 89.7 fL (ref 80.0–100.0)
Platelets: 315 10*3/uL (ref 150–400)
RBC: 3.69 MIL/uL — ABNORMAL LOW (ref 3.87–5.11)
RDW: 15.9 % — ABNORMAL HIGH (ref 11.5–15.5)
WBC: 9.8 10*3/uL (ref 4.0–10.5)
nRBC: 0 % (ref 0.0–0.2)

## 2018-05-12 LAB — I-STAT ARTERIAL BLOOD GAS, ED
Acid-Base Excess: 6 mmol/L — ABNORMAL HIGH (ref 0.0–2.0)
Bicarbonate: 29.3 mmol/L — ABNORMAL HIGH (ref 20.0–28.0)
O2 Saturation: 97 %
Patient temperature: 98.6
TCO2: 30 mmol/L (ref 22–32)
pCO2 arterial: 37.5 mmHg (ref 32.0–48.0)
pH, Arterial: 7.501 — ABNORMAL HIGH (ref 7.350–7.450)
pO2, Arterial: 82 mmHg — ABNORMAL LOW (ref 83.0–108.0)

## 2018-05-12 LAB — LIPASE, BLOOD: Lipase: 30 U/L (ref 11–51)

## 2018-05-12 LAB — BRAIN NATRIURETIC PEPTIDE: B Natriuretic Peptide: 172.1 pg/mL — ABNORMAL HIGH (ref 0.0–100.0)

## 2018-05-12 LAB — HEMOGLOBIN A1C
Hgb A1c MFr Bld: 5.7 % — ABNORMAL HIGH (ref 4.8–5.6)
Mean Plasma Glucose: 116.89 mg/dL

## 2018-05-12 LAB — TSH: TSH: 0.027 u[IU]/mL — ABNORMAL LOW (ref 0.350–4.500)

## 2018-05-12 LAB — TROPONIN I: Troponin I: <0.03 ng/mL (ref <0.03)

## 2018-05-12 MED ORDER — GUAIFENESIN-DM 100-10 MG/5ML PO SYRP
5.0000 mL | ORAL_SOLUTION | ORAL | Status: DC | PRN
  Administered 2018-05-12 – 2018-05-13 (×2): 5 mL via ORAL
  Filled 2018-05-12 (×3): qty 5

## 2018-05-12 MED ORDER — ESTRADIOL 1 MG PO TABS
0.5000 mg | ORAL_TABLET | Freq: Every morning | ORAL | Status: DC
  Administered 2018-05-13 – 2018-05-15 (×3): 0.5 mg via ORAL
  Filled 2018-05-12 (×3): qty 0.5

## 2018-05-12 MED ORDER — ONDANSETRON 4 MG PO TBDP: 4.0000 mg | ORAL_TABLET | Freq: Three times a day (TID) | ORAL | Status: DC | PRN

## 2018-05-12 MED ORDER — ONDANSETRON HCL 4 MG PO TABS: 4.0000 mg | ORAL_TABLET | Freq: Four times a day (QID) | ORAL | Status: DC | PRN

## 2018-05-12 MED ORDER — HYDRALAZINE HCL 25 MG PO TABS
25.0000 mg | ORAL_TABLET | Freq: Three times a day (TID) | ORAL | Status: DC
  Administered 2018-05-12 – 2018-05-15 (×9): 25 mg via ORAL
  Filled 2018-05-12 (×9): qty 1

## 2018-05-12 MED ORDER — GLIMEPIRIDE 1 MG PO TABS: 1.0000 mg | ORAL_TABLET | Freq: Every day | ORAL | Status: DC

## 2018-05-12 MED ORDER — PANTOPRAZOLE SODIUM 40 MG PO TBEC
40.0000 mg | DELAYED_RELEASE_TABLET | Freq: Every day | ORAL | Status: DC
  Administered 2018-05-13 – 2018-05-15 (×3): 40 mg via ORAL
  Filled 2018-05-12 (×3): qty 1

## 2018-05-12 MED ORDER — SUCRALFATE 1 G PO TABS
1.0000 g | ORAL_TABLET | Freq: Two times a day (BID) | ORAL | Status: DC
  Administered 2018-05-12 – 2018-05-15 (×6): 1 g via ORAL
  Filled 2018-05-12 (×6): qty 1

## 2018-05-12 MED ORDER — FLUTICASONE PROPIONATE 50 MCG/ACT NA SUSP
1.0000 | Freq: Every day | NASAL | Status: DC | PRN
  Filled 2018-05-12: qty 16

## 2018-05-12 MED ORDER — FUROSEMIDE 10 MG/ML IJ SOLN
40.0000 mg | Freq: Two times a day (BID) | INTRAMUSCULAR | Status: DC
  Administered 2018-05-12 – 2018-05-13 (×3): 40 mg via INTRAVENOUS
  Filled 2018-05-12 (×4): qty 4

## 2018-05-12 MED ORDER — DILTIAZEM HCL ER COATED BEADS 180 MG PO CP24
360.0000 mg | ORAL_CAPSULE | Freq: Every day | ORAL | Status: DC
  Administered 2018-05-13 – 2018-05-15 (×3): 360 mg via ORAL
  Filled 2018-05-12 (×2): qty 2
  Filled 2018-05-12: qty 1

## 2018-05-12 MED ORDER — LORAZEPAM 1 MG PO TABS
1.0000 mg | ORAL_TABLET | Freq: Every day | ORAL | Status: DC
  Administered 2018-05-12 – 2018-05-14 (×3): 1 mg via ORAL
  Filled 2018-05-12 (×3): qty 1

## 2018-05-12 MED ORDER — SACCHAROMYCES BOULARDII 250 MG PO CAPS
250.0000 mg | ORAL_CAPSULE | Freq: Two times a day (BID) | ORAL | Status: DC
  Administered 2018-05-13 – 2018-05-15 (×5): 250 mg via ORAL
  Filled 2018-05-12 (×6): qty 1

## 2018-05-12 MED ORDER — ATORVASTATIN CALCIUM 40 MG PO TABS
40.0000 mg | ORAL_TABLET | Freq: Every day | ORAL | Status: DC
  Administered 2018-05-12 – 2018-05-15 (×4): 40 mg via ORAL
  Filled 2018-05-12 (×4): qty 1

## 2018-05-12 MED ORDER — EZETIMIBE 10 MG PO TABS
10.0000 mg | ORAL_TABLET | Freq: Every day | ORAL | Status: DC
  Administered 2018-05-13 – 2018-05-15 (×3): 10 mg via ORAL
  Filled 2018-05-12 (×3): qty 1

## 2018-05-12 MED ORDER — LORATADINE 10 MG PO TABS
10.0000 mg | ORAL_TABLET | Freq: Every day | ORAL | Status: DC
  Administered 2018-05-12 – 2018-05-15 (×4): 10 mg via ORAL
  Filled 2018-05-12 (×4): qty 1

## 2018-05-12 MED ORDER — ADULT MULTIVITAMIN W/MINERALS CH
1.0000 | ORAL_TABLET | Freq: Every day | ORAL | Status: DC
  Administered 2018-05-13 – 2018-05-15 (×3): 1 via ORAL
  Filled 2018-05-12 (×3): qty 1

## 2018-05-12 MED ORDER — VITAMIN D 25 MCG (1000 UNIT) PO TABS
1000.0000 [IU] | ORAL_TABLET | Freq: Every day | ORAL | Status: DC
  Administered 2018-05-13 – 2018-05-15 (×3): 1000 [IU] via ORAL
  Filled 2018-05-12 (×3): qty 1

## 2018-05-12 MED ORDER — PALIPERIDONE ER 3 MG PO TB24
3.0000 mg | ORAL_TABLET | Freq: Every day | ORAL | Status: DC
  Administered 2018-05-13 – 2018-05-14 (×2): 3 mg via ORAL
  Filled 2018-05-12 (×4): qty 1

## 2018-05-12 MED ORDER — FERROUS SULFATE 325 (65 FE) MG PO TABS
325.0000 mg | ORAL_TABLET | Freq: Two times a day (BID) | ORAL | Status: DC
  Administered 2018-05-13 – 2018-05-15 (×5): 325 mg via ORAL
  Filled 2018-05-12 (×7): qty 1

## 2018-05-12 MED ORDER — LORAZEPAM 0.5 MG PO TABS
0.5000 mg | ORAL_TABLET | Freq: Every day | ORAL | Status: DC
  Administered 2018-05-13 – 2018-05-15 (×3): 0.5 mg via ORAL
  Filled 2018-05-12 (×3): qty 1

## 2018-05-12 MED ORDER — FLUTICASONE FUROATE-VILANTEROL 200-25 MCG/INH IN AEPB
1.0000 | INHALATION_SPRAY | Freq: Every day | RESPIRATORY_TRACT | Status: DC
  Administered 2018-05-14: 1 via RESPIRATORY_TRACT
  Filled 2018-05-12: qty 28

## 2018-05-12 MED ORDER — ESCITALOPRAM OXALATE 20 MG PO TABS
20.0000 mg | ORAL_TABLET | Freq: Every day | ORAL | Status: DC
  Administered 2018-05-12 – 2018-05-14 (×3): 20 mg via ORAL
  Filled 2018-05-12: qty 1
  Filled 2018-05-12: qty 2
  Filled 2018-05-12: qty 1

## 2018-05-12 MED ORDER — VITAMIN C 500 MG PO TABS
500.0000 mg | ORAL_TABLET | Freq: Every day | ORAL | Status: DC
  Administered 2018-05-14 – 2018-05-15 (×2): 500 mg via ORAL
  Filled 2018-05-12 (×2): qty 1

## 2018-05-12 MED ORDER — ONDANSETRON HCL 4 MG/2ML IJ SOLN: 4.0000 mg | Freq: Four times a day (QID) | INTRAMUSCULAR | Status: DC | PRN

## 2018-05-12 MED ORDER — LEVOTHYROXINE SODIUM 50 MCG PO TABS
250.0000 ug | ORAL_TABLET | Freq: Every day | ORAL | Status: DC
  Administered 2018-05-13 – 2018-05-15 (×3): 250 ug via ORAL
  Filled 2018-05-12: qty 1
  Filled 2018-05-12 (×2): qty 2
  Filled 2018-05-12: qty 1

## 2018-05-12 MED ORDER — ALBUTEROL SULFATE (2.5 MG/3ML) 0.083% IN NEBU: 2.5000 mg | INHALATION_SOLUTION | RESPIRATORY_TRACT | Status: DC | PRN

## 2018-05-12 MED ORDER — ACETAMINOPHEN 500 MG PO TABS: 500.0000 mg | ORAL_TABLET | Freq: Four times a day (QID) | ORAL | Status: DC | PRN

## 2018-05-12 MED ORDER — MONTELUKAST SODIUM 10 MG PO TABS
10.0000 mg | ORAL_TABLET | Freq: Every day | ORAL | Status: DC
  Administered 2018-05-13: 10 mg via ORAL
  Filled 2018-05-12 (×3): qty 1

## 2018-05-12 MED ORDER — BIOTIN 10000 MCG PO TABS: 1.0000 | ORAL_TABLET | Freq: Every day | ORAL | Status: DC

## 2018-05-12 MED ORDER — VITAMIN B-12 1000 MCG PO TABS
1000.0000 ug | ORAL_TABLET | Freq: Every day | ORAL | Status: DC
  Administered 2018-05-13 – 2018-05-15 (×3): 1000 ug via ORAL
  Filled 2018-05-12 (×3): qty 1

## 2018-05-12 MED ORDER — SODIUM CHLORIDE 0.9 % IV SOLN
1.0000 g | INTRAVENOUS | Status: DC
  Administered 2018-05-12 – 2018-05-13 (×2): 1 g via INTRAVENOUS
  Filled 2018-05-12 (×2): qty 10

## 2018-05-12 MED ORDER — METHYLPREDNISOLONE SODIUM SUCC 40 MG IJ SOLR
40.0000 mg | Freq: Two times a day (BID) | INTRAMUSCULAR | Status: DC
  Administered 2018-05-12 – 2018-05-14 (×4): 40 mg via INTRAVENOUS
  Filled 2018-05-12 (×5): qty 1

## 2018-05-12 MED ORDER — ENOXAPARIN SODIUM 30 MG/0.3ML ~~LOC~~ SOLN
30.0000 mg | SUBCUTANEOUS | Status: DC
  Administered 2018-05-13 – 2018-05-15 (×3): 30 mg via SUBCUTANEOUS
  Filled 2018-05-12 (×3): qty 0.3

## 2018-05-12 MED ORDER — LINAGLIPTIN 5 MG PO TABS
5.0000 mg | ORAL_TABLET | Freq: Every day | ORAL | Status: DC
  Administered 2018-05-13 – 2018-05-15 (×3): 5 mg via ORAL
  Filled 2018-05-12 (×3): qty 1

## 2018-05-12 MED ORDER — ENOXAPARIN SODIUM 40 MG/0.4ML ~~LOC~~ SOLN: 40.0000 mg | SUBCUTANEOUS | Status: DC

## 2018-05-12 MED ORDER — POTASSIUM CHLORIDE CRYS ER 20 MEQ PO TBCR: 20.0000 meq | EXTENDED_RELEASE_TABLET | Freq: Two times a day (BID) | ORAL | Status: DC

## 2018-05-12 MED ORDER — METOPROLOL SUCCINATE ER 100 MG PO TB24
100.0000 mg | ORAL_TABLET | Freq: Every morning | ORAL | Status: DC
  Administered 2018-05-13 – 2018-05-15 (×3): 100 mg via ORAL
  Filled 2018-05-12: qty 1
  Filled 2018-05-12: qty 4
  Filled 2018-05-12 (×2): qty 1
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
## 2018-05-12 NOTE — ED Triage Notes (Signed)
Pt to ED via EMS from [place] where she went for evaluation of increased sob and leg/abdominal swelling over the last two weeks. They were unable to get an O2 sat above 77% so they called EMS. Sats 99% on room air with EMS. Endorses taking diuretic as prescribed. Denies CP.
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
## 2018-05-13 ENCOUNTER — Observation Stay (HOSPITAL_COMMUNITY): Payer: Medicare Other

## 2018-05-13 DIAGNOSIS — J9601 Acute respiratory failure with hypoxia: Secondary | ICD-10-CM | POA: Diagnosis not present

## 2018-05-13 LAB — TROPONIN I
Troponin I: <0.03 ng/mL (ref <0.03)
Troponin I: <0.03 ng/mL (ref <0.03)

## 2018-05-13 LAB — URINE CULTURE: Culture: <10000 — AB

## 2018-05-13 LAB — RESPIRATORY PANEL BY PCR

## 2018-05-13 LAB — BASIC METABOLIC PANEL
Anion gap: 16 — ABNORMAL HIGH (ref 5–15)
BUN: 42 mg/dL — ABNORMAL HIGH (ref 8–23)
CO2: 23 mmol/L (ref 22–32)
Calcium: 9.1 mg/dL (ref 8.9–10.3)
Chloride: 92 mmol/L — ABNORMAL LOW (ref 98–111)
Creatinine, Ser: 2.4 mg/dL — ABNORMAL HIGH (ref 0.44–1.00)
GFR calc Af Amer: 22 mL/min — ABNORMAL LOW (ref >60)
GFR calc non Af Amer: 19 mL/min — ABNORMAL LOW (ref >60)
Glucose, Bld: 178 mg/dL — ABNORMAL HIGH (ref 70–99)
Potassium: 4.9 mmol/L (ref 3.5–5.1)
Sodium: 131 mmol/L — ABNORMAL LOW (ref 135–145)

## 2018-05-13 LAB — INFLUENZA PANEL BY PCR (TYPE A & B)
Influenza A By PCR: NEGATIVE
Influenza B By PCR: NEGATIVE

## 2018-05-13 MED ORDER — INSULIN ASPART 100 UNIT/ML ~~LOC~~ SOLN
0.0000 [IU] | Freq: Three times a day (TID) | SUBCUTANEOUS | Status: DC
  Administered 2018-05-13: 3 [IU] via SUBCUTANEOUS
  Administered 2018-05-14 (×2): 2 [IU] via SUBCUTANEOUS
  Administered 2018-05-14 – 2018-05-15 (×2): 3 [IU] via SUBCUTANEOUS
  Administered 2018-05-15: 2 [IU] via SUBCUTANEOUS

## 2018-05-13 NOTE — Progress Notes (Signed)
Placed on droplet precaution per order- explained to patient. Verbalized understanding
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
## 2018-05-13 NOTE — ED Notes (Signed)
Dr. Despina Arias returned page, no CBG checks at this time.

## 2018-05-13 NOTE — ED Notes (Signed)
Pt moved to and settled in [place]. Pt reconnected to cardiac monitoring and purewick. Pt resting comfortably, call light within reach.

## 2018-05-13 NOTE — ED Notes (Deleted)
Pt understands that he is leaving AMA.  Signature pad not working.  Advised pt that if any concerns return to ED.  Pt verbalized understanding.  C/o no pain.  Dr. Despina Arias returned text page, is aware pt is leaving.

## 2018-05-13 NOTE — Progress Notes (Deleted)
Placed on seizure precautions.seizure pads in place

## 2018-05-13 NOTE — ED Notes (Signed)
Pt ambulated to bathroom with assistance on pulse oximetry. Pt began with saturation at 97 percent, however desaturated to mids 80s and then to 77%. Pt endorsed labored breathing and tripoding, stopped in the hallway to catch her breath. O2 came back up to mid 80s and held there until returning to bed where she was placed on 2L O2 to help her catch her breath. Back up to 100% on 2L at this time.

## 2018-05-14 ENCOUNTER — Observation Stay (HOSPITAL_COMMUNITY): Payer: Medicare Other

## 2018-05-14 ENCOUNTER — Encounter (HOSPITAL_COMMUNITY): Payer: Self-pay | Admitting: *Deleted

## 2018-05-14 DIAGNOSIS — R0600 Dyspnea, unspecified: Secondary | ICD-10-CM | POA: Diagnosis not present

## 2018-05-14 DIAGNOSIS — I272 Pulmonary hypertension, unspecified: Secondary | ICD-10-CM | POA: Diagnosis present

## 2018-05-14 DIAGNOSIS — J9601 Acute respiratory failure with hypoxia: Secondary | ICD-10-CM | POA: Diagnosis not present

## 2018-05-14 LAB — CBC
HCT: 32.7 % — ABNORMAL LOW (ref 36.0–46.0)
Hemoglobin: 10.1 g/dL — ABNORMAL LOW (ref 12.0–15.0)
MCH: 27.2 pg (ref 26.0–34.0)
MCHC: 30.9 g/dL (ref 30.0–36.0)
MCV: 88.1 fL (ref 80.0–100.0)
Platelets: 330 10*3/uL (ref 150–400)
RBC: 3.71 MIL/uL — ABNORMAL LOW (ref 3.87–5.11)
RDW: 15.5 % (ref 11.5–15.5)
WBC: 9 10*3/uL (ref 4.0–10.5)
nRBC: 0 % (ref 0.0–0.2)

## 2018-05-14 LAB — BASIC METABOLIC PANEL
Anion gap: 14 (ref 5–15)
BUN: 60 mg/dL — ABNORMAL HIGH (ref 8–23)
CO2: 26 mmol/L (ref 22–32)
Calcium: 9.1 mg/dL (ref 8.9–10.3)
Chloride: 91 mmol/L — ABNORMAL LOW (ref 98–111)
Creatinine, Ser: 2.42 mg/dL — ABNORMAL HIGH (ref 0.44–1.00)
GFR calc Af Amer: 22 mL/min — ABNORMAL LOW (ref >60)
GFR calc non Af Amer: 19 mL/min — ABNORMAL LOW (ref >60)
Glucose, Bld: 204 mg/dL — ABNORMAL HIGH (ref 70–99)
Potassium: 4.5 mmol/L (ref 3.5–5.1)
Sodium: 131 mmol/L — ABNORMAL LOW (ref 135–145)

## 2018-05-14 LAB — GLUCOSE, CAPILLARY
Glucose-Capillary: 236 mg/dL — ABNORMAL HIGH (ref 70–99)
Glucose-Capillary: 192 mg/dL — ABNORMAL HIGH (ref 70–99)
Glucose-Capillary: 170 mg/dL — ABNORMAL HIGH (ref 70–99)
Glucose-Capillary: 157 mg/dL — ABNORMAL HIGH (ref 70–99)
Glucose-Capillary: 228 mg/dL — ABNORMAL HIGH (ref 70–99)
Glucose-Capillary: 177 mg/dL — ABNORMAL HIGH (ref 70–99)

## 2018-05-14 LAB — MAGNESIUM: Magnesium: 2.1 mg/dL (ref 1.7–2.4)

## 2018-05-14 MED ORDER — TECHNETIUM TO 99M ALBUMIN AGGREGATED
4.0000 | Freq: Once | INTRAVENOUS | Status: AC | PRN
  Administered 2018-05-14: 4 via INTRAVENOUS

## 2018-05-14 MED ORDER — TECHNETIUM TC 99M DIETHYLENETRIAME-PENTAACETIC ACID
30.0000 | Freq: Once | INTRAVENOUS | Status: AC | PRN
  Administered 2018-05-14: 30 via RESPIRATORY_TRACT

## 2018-05-14 MED ORDER — FUROSEMIDE 10 MG/ML IJ SOLN
80.0000 mg | Freq: Two times a day (BID) | INTRAMUSCULAR | Status: DC
  Administered 2018-05-14 – 2018-05-15 (×3): 80 mg via INTRAVENOUS
  Filled 2018-05-14 (×2): qty 8

## 2018-05-14 MED ORDER — BENZONATATE 100 MG PO CAPS
100.0000 mg | ORAL_CAPSULE | Freq: Three times a day (TID) | ORAL | Status: DC
  Administered 2018-05-14 – 2018-05-15 (×3): 100 mg via ORAL
  Filled 2018-05-14 (×3): qty 1

## 2018-05-14 NOTE — Plan of Care (Signed)
  Problem: Clinical Measurements: Goal: Ability to maintain clinical measurements within normal limits will improve Outcome: Progressing   
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
## 2018-05-14 NOTE — Progress Notes (Signed)
PROGRESS NOTE    Amanda Cain  OAC:[id number] DOB: 06-02-1945 DOA: 05/12/2018 PCP: Amanda Neer, MD   Brief Narrative:  Amanda Poteet Rankinis a 72 y.o.femalewith history h/ochronic diastolic CHF, chronic kidney disease stage III, chronic anemia, diabetes mellitus, hypertension, hypothyroidism presents with complaints of cough, occasionally productive with green phlegm, "sinus congestion" and exertional dyspnea since Monday. Patient states she had similar symptoms 2 weeks back and was treated by her primary care physician for bronchitis with antibiotics and prednisone course. She states her condition improved but she had a sick contact on Monday with her home health aide who had upper respiratory symptoms. Patient denies any fevers but states she had dry nose and epistaxis yesterday. She also complains of retrosternal/parasternal chest pain mostly when she coughs, 6/10, nonradiating not associated with nausea vomiting or palpitations. She states she has no pain when she is not coughing. Patient presented to urgent care center where apparently her pulse ox was low at 77% which prompted transfer here but records indicate that her O2 sat was 99% on room air with EMS. Patient reports compliance with diuretics but admits to noncompliance with low-salt diet. When asked about recent worsening of leg swelling she says "I thinkso".   Work-up in the ED revealed mild hyperkalemia on labs, creatinine stable around 2, BNP of 170, chest x-ray negative for pneumonia and equivocal for CHF.Per ED physician, patient's lowest O2 sat during ED stay here was 84%. She is currently on 2 L nasal cannula and saturating 98%. She is requested to be admitted for further evaluation and management  Observation course: Placed in overnight observation and upon ambulation desatted down to 72%.  She is still requiring significant oxygen support and requiring IV oxygen as well as IV steroids.  She has wheezing on  examination.    She has a history of severe pulmonary hypertension.  I have increased her IV Lasix dosing. Patient continues to be hypoxic today.  I have consulted pulmonary.  Assessment & Plan:   Principal Problem:   Acute respiratory failure with hypoxia (HCC) Active Problems:   Pulmonary hypertension (HCC)   Type 2 diabetes mellitus with complication, without long-term current use of insulin (HCC)   Acute on chronic diastolic heart failure (HCC)   CKD (chronic kidney disease), stage III (HCC)   Atypical chest pain   Anemia   GERD (gastroesophageal reflux disease)  1.Atypical reproducible chest pain: Costochondritis versus musculoskeletal pain.   Has ruled out for myocardial infarction by cardiac enzymes and EKG.Patient reports undergoing stress test few months back through her primary cardiologist but apparently could not complete the treadmill part. Last echo in June 2019 showed intact EF.  Suspect chest pain related to chronic diastolic congestive heart failure and hypoxia versus pulmonary hypertension..  She may have had some strain but no ischemia.  Chest pain is improved.  2.  Pulmonary hypertension: Lasix dose increased to 80 IV twice daily.  I have consulted pulmonary for further evaluation.  3.Dyspnea/acute hypoxic respiratory failure: Patient remains hypoxemic.  Desaturated to 72% with ambulation yesterday.    Chest x-ray rechecked yesterday and no evidence of worsening or acute infection.  Continuing IV antibiotics given the nature of her hypoxemia.  Although my suspicion is fairly low for acute bacterial infection.  Continue IV steroids at baseline ABG reassuring.Trying to taper O2 to off as tolerated to keep sats greater than 92% has been difficult. Nebulizer treatments scheduled and as needed.    Continue treatment for bronchitis with IV antibiotics.  Influenza test is negative.  Lasix dose increased.  Suspect pulmonary hypertension may be contributing to acute  hypoxemia.  4. Chronic diastolic CHF: Might have mild acute component but not very impressive as discussed above. Watch for worsening renal function on IV diuretics. Patient advised low-salt diet compliance.  BNP mildly elevated but not enough to explain hypoxemia.  5.Chronic kidney disease stage III: Creatinine stable around 2. Watch on diuretics.  Mildly increased today.  6.Diabetes mellitus: Diabetic diet. Resume home medications. Hemoglobin A1c 5.7 suggesting a mean plasma glucose of 116.  7.GERD:Resume home medications  8.Mild acute on chronic anemia:Baseline hemoglobin around 10.5-11.5. Patient does report mild epistaxis and hemoglobin at 9.9. Monitor for now.  DVT prophylaxis:Lovenox  Code Status:Full code  Family Communication:Discussed with patient. Health care proxy would beher sisters Consults called:None Admission status: Given patient's lack of improvement, continued severe hypoxemia and need for increased nebulizers, IV steroids and IV antibiotics will admit inpatient status.   Subjective: Still short of breath although improving slightly.  Remains hypoxemic we will consult cardiology.  Objective: Vitals:   05/14/18 0854 05/14/18 1050 05/14/18 1051 05/14/18 1231  BP:  (!) 153/64 (!) 153/64 (!) 150/61  Pulse:  84  65  Resp:    16  Temp:    97.8 F (36.6 C)  TempSrc:    Oral  SpO2: 94%   100%  Weight:      Height:        Intake/Output Summary (Last 24 hours) at 05/14/2018 1401 Last data filed at 05/14/2018 1230 Gross per 24 hour  Intake 480 ml  Output -  Net 480 ml   Filed Weights   05/12/18 1433 05/13/18 1329  Weight: 98.4 kg 100.4 kg    Examination:  General exam: Appears calm and comfortable  Respiratory system: Clear to auscultation. Respiratory effort normal. Cardiovascular system: S1 & S2 heard, RRR. No JVD, murmurs, rubs, gallops or clicks. No pedal edema. Gastrointestinal system: Abdomen is nondistended, soft  and nontender. No organomegaly or masses felt. Normal bowel sounds heard. Central nervous system: Alert and oriented. No focal neurological deficits. Extremities: Symmetric 5 x 5 power. Skin: No rashes, lesions or ulcers Psychiatry: Judgement and insight appear normal. Mood & affect appropriate.     Data Reviewed: I have personally reviewed following labs and imaging studies  CBC: Recent Labs  Lab 05/12/18 1457 05/14/18 0537  WBC 9.8 9.0  HGB 9.9* 10.1*  HCT 33.1* 32.7*  MCV 89.7 88.1  PLT 315 767   Basic Metabolic Panel: Recent Labs  Lab 05/12/18 1457 05/13/18 0741 05/14/18 0537  NA 131* 131* 131*  K 5.5* 4.9 4.5  CL 91* 92* 91*  CO2 29 23 26   GLUCOSE 108* 178* 204*  BUN 37* 42* 60*  CREATININE 2.25* 2.40* 2.42*  CALCIUM 9.3 9.1 9.1  MG  --   --  2.1   GFR: Estimated Creatinine Clearance: 21.3 mL/min (A) (by C-G formula based on SCr of 2.42 mg/dL (H)). Liver Function Tests: Recent Labs  Lab 05/12/18 1524  AST 21  ALT 21  ALKPHOS 56  BILITOT 0.8  PROT 6.7  ALBUMIN 3.3*   Recent Labs  Lab 05/12/18 1524  LIPASE 30   No results for input(s): AMMONIA in the last 168 hours. Coagulation Profile: No results for input(s): INR, PROTIME in the last 168 hours. Cardiac Enzymes: Recent Labs  Lab 05/12/18 1933 05/13/18 0053 05/13/18 0736  TROPONINI <0.03 <0.03 <0.03   BNP (last 3 results) Recent Labs  11/14/17 0812  PROBNP 557*   HbA1C: Recent Labs    05/12/18 1933  HGBA1C 5.7*   CBG: Recent Labs  Lab 05/13/18 1613 05/13/18 2202 05/14/18 0732 05/14/18 1150  GLUCAP 236* 192* 170* 157*   Lipid Profile: No results for input(s): CHOL, HDL, LDLCALC, TRIG, CHOLHDL, LDLDIRECT in the last 72 hours. Thyroid Function Tests: Recent Labs    05/12/18 1524  TSH 0.027*     Recent Results (from the past 240 hour(s))  Urine culture     Status: Abnormal   Collection Time: 05/12/18  3:24 PM  Result Value Ref Range Status   Specimen Description  URINE, RANDOM  Final   Special Requests NONE  Final   Culture (A)  Final    <10,000 COLONIES/mL INSIGNIFICANT GROWTH Performed at [facility], [address], [place], [place] [address]    Report Status 05/13/2018 FINAL  Final  Respiratory Panel by PCR     Status: None   Collection Time: 05/13/18  3:08 PM  Result Value Ref Range Status   Adenovirus NOT DETECTED NOT DETECTED Final   Coronavirus 229E NOT DETECTED NOT DETECTED Final   Coronavirus HKU1 NOT DETECTED NOT DETECTED Final   Coronavirus NL63 NOT DETECTED NOT DETECTED Final   Coronavirus OC43 NOT DETECTED NOT DETECTED Final   Metapneumovirus NOT DETECTED NOT DETECTED Final   Rhinovirus / Enterovirus NOT DETECTED NOT DETECTED Final   Influenza A NOT DETECTED NOT DETECTED Final   Influenza B NOT DETECTED NOT DETECTED Final   Parainfluenza Virus 1 NOT DETECTED NOT DETECTED Final   Parainfluenza Virus 2 NOT DETECTED NOT DETECTED Final   Parainfluenza Virus 3 NOT DETECTED NOT DETECTED Final   Parainfluenza Virus 4 NOT DETECTED NOT DETECTED Final   Respiratory Syncytial Virus NOT DETECTED NOT DETECTED Final   Bordetella pertussis NOT DETECTED NOT DETECTED Final   Chlamydophila pneumoniae NOT DETECTED NOT DETECTED Final   Mycoplasma pneumoniae NOT DETECTED NOT DETECTED Final    Comment: Performed at [facility], [place] [address] [place], [place] [address]     Radiology Studies: Dg Chest 2 View  Result Date: 05/13/2018 CLINICAL DATA:  Chest pain and shortness of breath yesterday. EXAM: CHEST - 2 VIEW COMPARISON:  PA and lateral chest 05/12/2018 and 03/31/2017. FINDINGS: Small focus of atelectasis in the lingula is noted. Heart size is enlarged. No consolidative process, pneumothorax or edema. No pleural effusion. No acute or focal bony abnormality. IMPRESSION: Cardiomegaly without acute disease. Electronically Signed   By: Inge Rise M.D.   On: 05/13/2018 15:04   Dg Chest 2 View  Result Date:  05/12/2018 CLINICAL DATA:  This of breath for a week. EXAM: CHEST - 2 VIEW COMPARISON:  March 31, 2018 FINDINGS: The heart size is borderline to mildly enlarged. Possible mild pulmonary venous congestion with no overt edema. No nodules, masses, or focal infiltrates. IMPRESSION: Mild cardiomegaly. Mild pulmonary venous congestion not excluded. No focal infiltrate. Electronically Signed   By: Dorise Bullion III M.D   On: 05/12/2018 15:53   Nm Pulmonary Perf And Vent  Result Date: 05/14/2018 CLINICAL DATA:  Shortness of breath and substernal chest pain. Evaluate for pulmonary embolism. EXAM: NUCLEAR MEDICINE VENTILATION - PERFUSION LUNG SCAN TECHNIQUE: Ventilation images were obtained in multiple projections using inhaled aerosol Tc-22m DTPA. Perfusion images were obtained in multiple projections after intravenous injection of Tc-77m MAA. RADIOPHARMACEUTICALS:  31.2 mCi of Tc-47m DTPA aerosol inhalation and 4.3 mCi Tc110m MAA IV COMPARISON:  Chest radiograph-05/13/2018;  05/12/2018; 03/31/2018; 07/05/2017 FINDINGS: Review of chest radiograph performed a prior demonstrates borderline enlarged cardiac silhouette and mediastinal contours. Minimal linear heterogeneous opacities with the peripheral aspect of the left mid lung. Pulmonary venous congestion without frank evidence of edema. No pleural effusion or pneumothorax. No acute osseous abnormalities. Ventilation: Ventilatory images demonstrates clumping of inhaled radiotracer about the bilateral pulmonary hila, right greater than left. Ingested radiotracer is seen within the hypopharynx, superior and distal aspects of the esophagus as well as the stomach. Perfusion: Perfusion images demonstrate homogeneous perfusion of the pulmonary parenchyma without discrete segmental or subsegmental mismatched filling defect to suggest pulmonary embolism. IMPRESSION: Pulmonary embolism absent (very low probability of pulmonary embolism). Electronically Signed   By: Sandi Mariscal  M.D.   On: 05/14/2018 11:20     Scheduled Meds: . atorvastatin  40 mg Oral Daily  . benzonatate  100 mg Oral TID  . cholecalciferol  1,000 Units Oral Daily  . diltiazem  360 mg Oral Daily  . enoxaparin (LOVENOX) injection  30 mg Subcutaneous Q24H  . escitalopram  20 mg Oral QHS  . estradiol  0.5 mg Oral q morning - 10a  . ezetimibe  10 mg Oral Daily  . ferrous sulfate  325 mg Oral BID WC  . fluticasone furoate-vilanterol  1 puff Inhalation Daily  . furosemide  80 mg Intravenous BID  . hydrALAZINE  25 mg Oral Q8H  . insulin aspart  0-9 Units Subcutaneous TID WC  . levothyroxine  250 mcg Oral Q0600  . linagliptin  5 mg Oral Daily  . loratadine  10 mg Oral Daily  . LORazepam  0.5 mg Oral Daily  . LORazepam  1 mg Oral QHS  . methylPREDNISolone (SOLU-MEDROL) injection  40 mg Intravenous Q12H  . metoprolol succinate  100 mg Oral q morning - 10a  . montelukast  10 mg Oral QHS  . multivitamin with minerals  1 tablet Oral Daily  . paliperidone  3 mg Oral QHS  . pantoprazole  40 mg Oral Daily  . saccharomyces boulardii  250 mg Oral BID  . sucralfate  1 g Oral BID  . vitamin B-12  1,000 mcg Oral Daily  . vitamin C  500 mg Oral Daily   Continuous Infusions: . cefTRIAXone (ROCEPHIN)  IV 1 g (05/13/18 2053)     LOS: 0 days    Time spent: 35 minutes   Lady Deutscher, MD FACP [facility] Pager [phone]  If 7PM-7AM, please contact night-coverage [email or web address] Password [email or web address] 05/14/2018, 2:01 PM

## 2018-05-15 DIAGNOSIS — J9611 Chronic respiratory failure with hypoxia: Secondary | ICD-10-CM | POA: Diagnosis present

## 2018-05-15 DIAGNOSIS — E785 Hyperlipidemia, unspecified: Secondary | ICD-10-CM | POA: Diagnosis present

## 2018-05-15 DIAGNOSIS — E039 Hypothyroidism, unspecified: Secondary | ICD-10-CM | POA: Diagnosis present

## 2018-05-15 DIAGNOSIS — R0602 Shortness of breath: Secondary | ICD-10-CM | POA: Diagnosis present

## 2018-05-15 DIAGNOSIS — Z820 Family history of epilepsy and other diseases of the nervous system: Secondary | ICD-10-CM | POA: Diagnosis not present

## 2018-05-15 DIAGNOSIS — E669 Obesity, unspecified: Secondary | ICD-10-CM | POA: Diagnosis present

## 2018-05-15 DIAGNOSIS — I272 Pulmonary hypertension, unspecified: Secondary | ICD-10-CM | POA: Diagnosis present

## 2018-05-15 DIAGNOSIS — J9621 Acute and chronic respiratory failure with hypoxia: Secondary | ICD-10-CM | POA: Diagnosis present

## 2018-05-15 DIAGNOSIS — Z8249 Family history of ischemic heart disease and other diseases of the circulatory system: Secondary | ICD-10-CM | POA: Diagnosis not present

## 2018-05-15 DIAGNOSIS — Z8042 Family history of malignant neoplasm of prostate: Secondary | ICD-10-CM | POA: Diagnosis not present

## 2018-05-15 DIAGNOSIS — I130 Hypertensive heart and chronic kidney disease with heart failure and stage 1 through stage 4 chronic kidney disease, or unspecified chronic kidney disease: Secondary | ICD-10-CM | POA: Diagnosis present

## 2018-05-15 DIAGNOSIS — R040 Epistaxis: Secondary | ICD-10-CM | POA: Diagnosis present

## 2018-05-15 DIAGNOSIS — Z79899 Other long term (current) drug therapy: Secondary | ICD-10-CM | POA: Diagnosis not present

## 2018-05-15 DIAGNOSIS — J9601 Acute respiratory failure with hypoxia: Secondary | ICD-10-CM | POA: Diagnosis not present

## 2018-05-15 DIAGNOSIS — Z823 Family history of stroke: Secondary | ICD-10-CM | POA: Diagnosis not present

## 2018-05-15 DIAGNOSIS — Z9111 Patient's noncompliance with dietary regimen: Secondary | ICD-10-CM | POA: Diagnosis not present

## 2018-05-15 DIAGNOSIS — I2510 Atherosclerotic heart disease of native coronary artery without angina pectoris: Secondary | ICD-10-CM | POA: Diagnosis present

## 2018-05-15 DIAGNOSIS — N183 Chronic kidney disease, stage 3 (moderate): Secondary | ICD-10-CM | POA: Diagnosis present

## 2018-05-15 DIAGNOSIS — Z90710 Acquired absence of both cervix and uterus: Secondary | ICD-10-CM | POA: Diagnosis not present

## 2018-05-15 DIAGNOSIS — E1122 Type 2 diabetes mellitus with diabetic chronic kidney disease: Secondary | ICD-10-CM | POA: Diagnosis present

## 2018-05-15 DIAGNOSIS — Z8041 Family history of malignant neoplasm of ovary: Secondary | ICD-10-CM | POA: Diagnosis not present

## 2018-05-15 DIAGNOSIS — D649 Anemia, unspecified: Secondary | ICD-10-CM | POA: Diagnosis present

## 2018-05-15 DIAGNOSIS — E7800 Pure hypercholesterolemia, unspecified: Secondary | ICD-10-CM | POA: Diagnosis present

## 2018-05-15 DIAGNOSIS — I5033 Acute on chronic diastolic (congestive) heart failure: Secondary | ICD-10-CM | POA: Diagnosis present

## 2018-05-15 DIAGNOSIS — K219 Gastro-esophageal reflux disease without esophagitis: Secondary | ICD-10-CM | POA: Diagnosis present

## 2018-05-15 DIAGNOSIS — F329 Major depressive disorder, single episode, unspecified: Secondary | ICD-10-CM | POA: Diagnosis present

## 2018-05-15 DIAGNOSIS — Z79890 Hormone replacement therapy (postmenopausal): Secondary | ICD-10-CM | POA: Diagnosis not present

## 2018-05-15 LAB — BASIC METABOLIC PANEL
Anion gap: 13 (ref 5–15)
BUN: 69 mg/dL — ABNORMAL HIGH (ref 8–23)
CO2: 27 mmol/L (ref 22–32)
Calcium: 9.1 mg/dL (ref 8.9–10.3)
Chloride: 91 mmol/L — ABNORMAL LOW (ref 98–111)
Creatinine, Ser: 2.52 mg/dL — ABNORMAL HIGH (ref 0.44–1.00)
GFR calc Af Amer: 21 mL/min — ABNORMAL LOW (ref >60)
GFR calc non Af Amer: 18 mL/min — ABNORMAL LOW (ref >60)
Glucose, Bld: 170 mg/dL — ABNORMAL HIGH (ref 70–99)
Potassium: 4.3 mmol/L (ref 3.5–5.1)
Sodium: 131 mmol/L — ABNORMAL LOW (ref 135–145)

## 2018-05-15 LAB — GLUCOSE, CAPILLARY
Glucose-Capillary: 154 mg/dL — ABNORMAL HIGH (ref 70–99)
Glucose-Capillary: 218 mg/dL — ABNORMAL HIGH (ref 70–99)

## 2018-05-15 MED ORDER — BENZONATATE 100 MG PO CAPS: 100.0000 mg | ORAL_CAPSULE | Freq: Three times a day (TID) | ORAL | 0 refills | Status: DC

## 2018-05-15 NOTE — Progress Notes (Signed)
Pt given discharge instructions, prescriptions, and care notes. Home O2 delivered to room. Pt verbalized understanding AEB no further questions or concerns at this time. IV was discontinued, no redness, pain, or swelling noted at this time. Telemetry discontinued and Centralized Telemetry was notified. Pt left the floor via wheelchair with staff in stable condition.

## 2018-05-15 NOTE — Progress Notes (Signed)
SATURATION QUALIFICATIONS: (This note is used to comply with regulatory documentation for home oxygen)  Patient Saturations on Room Air at Rest = 93%  Patient Saturations on Room Air while Ambulating = 82%  Patient Saturations on 2 Liters of oxygen while Ambulating = 92%  Please briefly explain why patient needs home oxygen: Pt needs home O2 in order to maintain sats above 88% while ambulating.

## 2018-05-15 NOTE — Care Management Note (Addendum)
Case Management Note  Patient Details  Name: Amanda Cain MRN: [id number] Date of Birth: 21-Mar-1946  Subjective/Objective:  Presents with ARF.Resides alone. Receives N.Aide services M-F, 8a-10a. PTA. No dme usage.                 Verda Mehta (Sister) [name] (Sister)    [phone]      PCP: Dr. Serita Grammes   PORTABLE OXYGEN TANK WILL BE DELIVERED TO BEDSIDE PRIOR TO DISCHARGE, Referral made with [facility]/ [facility]- [id number].   Action/Plan: Transition to home when medically stable.Marland KitchenMarland KitchenNCM followinfg for TOC needs. States has transportation to home.  Expected Discharge Date:  05/14/18               Expected Discharge Plan:  Home/Self Care  In-House Referral:  NA  Discharge planning Services  CM Consult  Post Acute Care Choice:    Choice offered to:  Patient  DME Arranged:   OXYGEN DME Agency:   [facility]  HH Arranged:   N/A HH Agency:   N/A  Status of Service:  COMPLETE  If discussed at Long Length of Stay Meetings, dates discussed:    Additional Comments:  Sharin Mons, RN 05/15/2018, 12:58 PM

## 2018-05-15 NOTE — Discharge Summary (Signed)
Physician Discharge Summary  AURIELLA WIEAND YCX:[id number] DOB: 1945-07-30 DOA: 05/12/2018  PCP: Mayra Neer, MD  Admit date: 05/12/2018 Discharge date: 05/15/2018   Admitted From:home  Disposition: Home  Recommendations for Outpatient Follow-up:  1. Follow up with PCP in 1-2 weeks to arrange outpatient sleep study 2. Follow-up with pulmonary in 1 to 2 weeks 3. Please obtain BMP/CBC in one week   Home Health: No Equipment/Devices: Oxygen 2 L  Discharge Condition: Stable  CODE STATUS: Full code Diet recommendation: Heart healthy carb modified with a 1500 mL fluid restriction per 24 hours  Brief/Interim Summary: Sandhya Denherder Rankinis a 72 y.o.femalewith history h/ochronic diastolic CHF, chronic kidney disease stage III, chronic anemia, diabetes mellitus, hypertension, hypothyroidism presents with complaints of cough, occasionally productive with green phlegm, "sinus congestion" and exertional dyspnea since Monday. Patient states she had similar symptoms 2 weeks back and was treated by her primary care physician for bronchitis with antibiotics and prednisone course. She states her condition improved but she had a sick contact on Monday with her home health aide who had upper respiratory symptoms. Patient denies any fevers but states she had dry nose and epistaxis yesterday. She also complains of retrosternal/parasternal chest pain mostly when she coughs, 6/10, nonradiating not associated with nausea vomiting or palpitations. She states she has no pain when she is not coughing. Patient presented to urgent care center where apparently her pulse ox was low at 77% which prompted transfer here but records indicate that her O2 sat was 99% on room air with EMS. Patient reports compliance with diuretics but admits to noncompliance with low-salt diet. When asked about recent worsening of leg swelling she says "I thinkso".   Work-up in the ED revealed mild hyperkalemia on labs,  creatinine stable around 2, BNP of 170, chest x-ray negative for pneumonia and equivocal for CHF.Per ED physician, patient's lowest O2 sat during ED stay here was 84%. She is currently on 2 L nasal cannula and saturating 98%. She is requested to be admitted for further evaluation and management Placed in overnight observation and upon ambulation desatted down to 72%. She is still requiring significant oxygen support and requiring IV oxygen as well as IV steroids. She has wheezing on examination.   She has a history of severe pulmonary hypertension.  I have increased her IV Lasix dosing. Patient continues to be hypoxic today.  I have consulted pulmonary.  Mary felt the patient had obesity related lung disease manifested by chest wall restriction, obesity related airway obstruction causing wheezing, and possible obstructive sleep apnea.  I recommended continued diuresis and follow renal function.  Reinforced fluid and sodium restriction at home.  Home oxygen assessment which has been done (patient will discharge on 2 L nasal cannula continuously) and an outpatient sleep study which I will refer her back to her primary care physician to have that arranged.   Patient has reached maximal benefit of hospitalization.  Discharge diagnosis, prognosis, plans, follow-up, medications and treatments discussed with the patient(or responsible party) and is in agreement with the plans as described.  Patient is stable for discharge.  Discharge Diagnoses:  Principal Problem:   Acute respiratory failure with hypoxia (HCC) Active Problems:   Pulmonary hypertension (HCC)   Type 2 diabetes mellitus with complication, without long-term current use of insulin (HCC)   Acute on chronic diastolic heart failure (HCC)   CKD (chronic kidney disease), stage III (HCC)   Atypical chest pain   Anemia   GERD (gastroesophageal reflux disease)  Acute on chronic respiratory failure with hypoxia [facility])    Discharge  Instructions  Discharge Instructions    (HEART FAILURE PATIENTS) Call MD:  Anytime you have any of the following symptoms: 1) 3 pound weight gain in 24 hours or 5 pounds in 1 week 2) shortness of breath, with or without a dry hacking cough 3) swelling in the hands, feet or stomach 4) if you have to sleep on extra pillows at night in order to breathe.   Complete by:  As directed    Avoid straining   Complete by:  As directed    Diet - low sodium heart healthy   Complete by:  As directed    1566ml fluid restriction at discharge   Heart Failure patients record your daily weight using the same scale at the same time of day   Complete by:  As directed    Increase activity slowly   Complete by:  As directed    Increase activity slowly   Complete by:  As directed    STOP any activity that causes chest pain, shortness of breath, dizziness, sweating, or exessive weakness   Complete by:  As directed      Allergies as of 05/15/2018      Reactions   Aspirin    " Have an ulcer"      Medication List    TAKE these medications   acetaminophen 500 MG tablet Commonly known as:  TYLENOL Take 500 mg by mouth every 6 (six) hours as needed for mild pain.   albuterol 108 (90 Base) MCG/ACT inhaler Commonly known as:  PROVENTIL HFA;VENTOLIN HFA Inhale 2 puffs into the lungs every 4 (four) hours as needed for wheezing or shortness of breath.   albuterol (2.5 MG/3ML) 0.083% nebulizer solution Commonly known as:  PROVENTIL Take 2.5 mg by nebulization every 4 (four) hours as needed for wheezing or shortness of breath.   atorvastatin 40 MG tablet Commonly known as:  LIPITOR Take 40 mg by mouth daily.   benzonatate 100 MG capsule Commonly known as:  TESSALON Take 1 capsule (100 mg total) by mouth 3 (three) times daily.   Biotin 10000 MCG Tabs Take 1 tablet by mouth daily.   BREO ELLIPTA 200-25 MCG/INH Aepb Generic drug:  fluticasone furoate-vilanterol INHALE 1 PUFF INTO THE LUNGS DAILY    cholecalciferol 1000 units tablet Commonly known as:  VITAMIN D Take 1,000 Units by mouth daily.   diltiazem 360 MG 24 hr capsule Commonly known as:  CARDIZEM CD Take 1 capsule by mouth daily.   escitalopram 20 MG tablet Commonly known as:  LEXAPRO Take 20 mg by mouth at bedtime.   estradiol 0.5 MG tablet Commonly known as:  ESTRACE Take 0.5 mg by mouth every morning.   ezetimibe 10 MG tablet Commonly known as:  ZETIA Take 10 mg by mouth daily.   ferrous sulfate 325 (65 FE) MG tablet Take 325 mg by mouth 2 (two) times daily with a meal.   fluticasone 50 MCG/ACT nasal spray Commonly known as:  FLONASE Place 1 spray into both nostrils daily as needed for allergies.   glimepiride 1 MG tablet Commonly known as:  AMARYL TK 1 T PO QD WITH BRE OR THE FIRST MAIN MEAL OF THE DAY AND WITH BRE   hydrALAZINE 25 MG tablet Commonly known as:  APRESOLINE Take 1 tablet (25 mg total) by mouth every 8 (eight) hours.   levothyroxine 125 MCG tablet Commonly known as:  SYNTHROID, LEVOTHROID Take  250 mcg by mouth daily before breakfast.   LORazepam 0.5 MG tablet Commonly known as:  ATIVAN Take 0.5-1 mg by mouth 2 (two) times daily. Take one tablet in the morning and two tablets at bedtime.   metoprolol succinate 100 MG 24 hr tablet Commonly known as:  TOPROL-XL Take 100 mg by mouth every morning.   montelukast 10 MG tablet Commonly known as:  SINGULAIR take 1 tablet by mouth at bedtime   multivitamin with minerals Tabs tablet Take 1 tablet by mouth daily.   ondansetron 4 MG disintegrating tablet Commonly known as:  ZOFRAN-ODT Take 4 mg by mouth every 8 (eight) hours as needed for nausea or vomiting.   paliperidone 3 MG 24 hr tablet Commonly known as:  INVEGA Take 3 mg by mouth at bedtime.   pantoprazole 40 MG tablet Commonly known as:  PROTONIX Take 1 tablet (40 mg total) by mouth daily.   PAZEO 0.7 % Soln Generic drug:  Olopatadine HCl Place 1 drop into both eyes  daily.   potassium chloride SA 20 MEQ tablet Commonly known as:  K-DUR,KLOR-CON Take 1 tablet (20 mEq total) by mouth 2 (two) times daily.   Promethazine-Codeine 6.25-10 MG/5ML Soln TAKE 1 TEASPOON AT BEDTIME AS NEEDED   saccharomyces boulardii 250 MG capsule Commonly known as:  FLORASTOR Take 1 capsule (250 mg total) by mouth 2 (two) times daily.   sitaGLIPtin 50 MG tablet Commonly known as:  JANUVIA Take 50 mg by mouth daily.   sucralfate 1 g tablet Commonly known as:  CARAFATE Take 1 g by mouth 2 (two) times daily.   SYSTANE 0.4-0.3 % Soln Generic drug:  Polyethyl Glycol-Propyl Glycol Apply 1 drop to eye daily as needed (for dry eyes).   torsemide 100 MG tablet Commonly known as:  DEMADEX Take 100 mg by mouth daily.   vitamin B-12 1000 MCG tablet Commonly known as:  CYANOCOBALAMIN Take 1,000 mcg by mouth daily.   vitamin C 500 MG tablet Commonly known as:  ASCORBIC ACID Take 500 mg by mouth daily.            Durable Medical Equipment  (From admission, onward)         Start     Ordered   05/15/18 1338  DME Oxygen  Once    Question Answer Comment  Mode or (Route) Nasal cannula   Liters per Minute 2   Frequency Continuous (stationary and portable oxygen unit needed)   Oxygen conserving device Yes   Oxygen delivery system Gas      05/15/18 [place] Follow up.   Why:  home health oxygen arranged Contact information: [place] [address] [phone]        Mayra Neer, MD. Schedule an appointment as soon as possible for a visit in 1 week(s).   Specialty:  Family Medicine Contact information: [address] [place] [address] [phone]        Jettie Booze, MD. Schedule an appointment as soon as possible for a visit in 1 week(s).   Specialties:  Cardiology, Radiology, Interventional Cardiology Contact information: [address] [place] [address] [phone]          Allergies  Allergen Reactions  . Aspirin     " Have an ulcer"    Consultations:  Pulmonary   Procedures/Studies: Dg Chest  2 View  Result Date: 05/13/2018 CLINICAL DATA:  Chest pain and shortness of breath yesterday. EXAM: CHEST - 2 VIEW COMPARISON:  PA and lateral chest 05/12/2018 and 03/31/2017. FINDINGS: Small focus of atelectasis in the lingula is noted. Heart size is enlarged. No consolidative process, pneumothorax or edema. No pleural effusion. No acute or focal bony abnormality. IMPRESSION: Cardiomegaly without acute disease. Electronically Signed   By: Inge Rise M.D.   On: 05/13/2018 15:04   Dg Chest 2 View  Result Date: 05/12/2018 CLINICAL DATA:  This of breath for a week. EXAM: CHEST - 2 VIEW COMPARISON:  March 31, 2018 FINDINGS: The heart size is borderline to mildly enlarged. Possible mild pulmonary venous congestion with no overt edema. No nodules, masses, or focal infiltrates. IMPRESSION: Mild cardiomegaly. Mild pulmonary venous congestion not excluded. No focal infiltrate. Electronically Signed   By: Dorise Bullion III M.D   On: 05/12/2018 15:53   Nm Pulmonary Perf And Vent  Result Date: 05/14/2018 CLINICAL DATA:  Shortness of breath and substernal chest pain. Evaluate for pulmonary embolism. EXAM: NUCLEAR MEDICINE VENTILATION - PERFUSION LUNG SCAN TECHNIQUE: Ventilation images were obtained in multiple projections using inhaled aerosol Tc-17m DTPA. Perfusion images were obtained in multiple projections after intravenous injection of Tc-71m MAA. RADIOPHARMACEUTICALS:  31.2 mCi of Tc-5m DTPA aerosol inhalation and 4.3 mCi Tc47m MAA IV COMPARISON:  Chest radiograph-05/13/2018; 05/12/2018; 03/31/2018; 07/05/2017 FINDINGS: Review of chest radiograph performed a prior demonstrates borderline enlarged cardiac silhouette and mediastinal contours. Minimal linear heterogeneous opacities with the  peripheral aspect of the left mid lung. Pulmonary venous congestion without frank evidence of edema. No pleural effusion or pneumothorax. No acute osseous abnormalities. Ventilation: Ventilatory images demonstrates clumping of inhaled radiotracer about the bilateral pulmonary hila, right greater than left. Ingested radiotracer is seen within the hypopharynx, superior and distal aspects of the esophagus as well as the stomach. Perfusion: Perfusion images demonstrate homogeneous perfusion of the pulmonary parenchyma without discrete segmental or subsegmental mismatched filling defect to suggest pulmonary embolism. IMPRESSION: Pulmonary embolism absent (very low probability of pulmonary embolism). Electronically Signed   By: Sandi Mariscal M.D.   On: 05/14/2018 11:20       Subjective: Patient feeling much better today.  She is able to ambulate more easily.  Discharge Exam: Vitals:   05/15/18 0552 05/15/18 1325  BP: (!) 156/65 (!) 166/62  Pulse: 65 63  Resp:  20  Temp:  97.7 F (36.5 C)  SpO2: 100% 98%   Vitals:   05/15/18 0451 05/15/18 0552 05/15/18 0840 05/15/18 1325  BP: 137/60 (!) 156/65  (!) 166/62  Pulse: 62 65  63  Resp: (!) 21   20  Temp: 98.1 F (36.7 C)   97.7 F (36.5 C)  TempSrc: Oral   Oral  SpO2: 99% 100%  98%  Weight:   101.5 kg   Height:        General: Pt is alert, awake, not in acute distress Cardiovascular: RRR, S1/S2 +, no rubs, no gallops Respiratory: CTA bilaterally, no wheezing, no rhonchi Abdominal: Soft, NT, ND, bowel sounds + Extremities: no edema, no cyanosis    The results of significant diagnostics from this hospitalization (including imaging, microbiology, ancillary and laboratory) are listed below for reference.     Microbiology: Recent Results (from the past 240 hour(s))  Urine culture     Status: Abnormal   Collection Time: 05/12/18  3:24 PM  Result Value Ref Range Status   Specimen Description URINE, RANDOM  Final  Special Requests NONE   Final   Culture (A)  Final    <10,000 COLONIES/mL INSIGNIFICANT GROWTH Performed at [facility], [place] [address] [place], [place] [address]    Report Status 05/13/2018 FINAL  Final  Respiratory Panel by PCR     Status: None   Collection Time: 05/13/18  3:08 PM  Result Value Ref Range Status   Adenovirus NOT DETECTED NOT DETECTED Final   Coronavirus 229E NOT DETECTED NOT DETECTED Final   Coronavirus HKU1 NOT DETECTED NOT DETECTED Final   Coronavirus NL63 NOT DETECTED NOT DETECTED Final   Coronavirus OC43 NOT DETECTED NOT DETECTED Final   Metapneumovirus NOT DETECTED NOT DETECTED Final   Rhinovirus / Enterovirus NOT DETECTED NOT DETECTED Final   Influenza A NOT DETECTED NOT DETECTED Final   Influenza B NOT DETECTED NOT DETECTED Final   Parainfluenza Virus 1 NOT DETECTED NOT DETECTED Final   Parainfluenza Virus 2 NOT DETECTED NOT DETECTED Final   Parainfluenza Virus 3 NOT DETECTED NOT DETECTED Final   Parainfluenza Virus 4 NOT DETECTED NOT DETECTED Final   Respiratory Syncytial Virus NOT DETECTED NOT DETECTED Final   Bordetella pertussis NOT DETECTED NOT DETECTED Final   Chlamydophila pneumoniae NOT DETECTED NOT DETECTED Final   Mycoplasma pneumoniae NOT DETECTED NOT DETECTED Final    Comment: Performed at [facility], [place] [address], [place], [place] [address]     Labs: BNP (last 3 results) Recent Labs    12/12/17 1726 03/31/18 1428 05/12/18 1457  BNP 212.5* 157.3* 093.2*   Basic Metabolic Panel: Recent Labs  Lab 05/12/18 1457 05/13/18 0741 05/14/18 0537 05/15/18 0856  NA 131* 131* 131* 131*  K 5.5* 4.9 4.5 4.3  CL 91* 92* 91* 91*  CO2 29 23 26 27   GLUCOSE 108* 178* 204* 170*  BUN 37* 42* 60* 69*  CREATININE 2.25* 2.40* 2.42* 2.52*  CALCIUM 9.3 9.1 9.1 9.1  MG  --   --  2.1  --    Liver Function Tests: Recent Labs  Lab 05/12/18 1524  AST 21  ALT 21  ALKPHOS 56  BILITOT 0.8  PROT 6.7  ALBUMIN 3.3*   Recent Labs  Lab 05/12/18 1524  LIPASE  30   No results for input(s): AMMONIA in the last 168 hours. CBC: Recent Labs  Lab 05/12/18 1457 05/14/18 0537  WBC 9.8 9.0  HGB 9.9* 10.1*  HCT 33.1* 32.7*  MCV 89.7 88.1  PLT 315 330   Cardiac Enzymes: Recent Labs  Lab 05/12/18 1933 05/13/18 0053 05/13/18 0736  TROPONINI <0.03 <0.03 <0.03   BNP: Invalid input(s): POCBNP CBG: Recent Labs  Lab 05/14/18 1150 05/14/18 1657 05/14/18 2059 05/15/18 0826 05/15/18 1143  GLUCAP 157* 228* 177* 154* 218*   D-Dimer No results for input(s): DDIMER in the last 72 hours. Hgb A1c Recent Labs    05/12/18 1933  HGBA1C 5.7*   Lipid Profile No results for input(s): CHOL, HDL, LDLCALC, TRIG, CHOLHDL, LDLDIRECT in the last 72 hours. Thyroid function studies Recent Labs    05/12/18 1524  TSH 0.027*   Anemia work up No results for input(s): VITAMINB12, FOLATE, FERRITIN, TIBC, IRON, RETICCTPCT in the last 72 hours. Urinalysis    Component Value Date/Time   COLORURINE YELLOW 05/12/2018 1524   APPEARANCEUR HAZY (A) 05/12/2018 1524   LABSPEC 1.010 05/12/2018 1524   PHURINE 6.0 05/12/2018 1524   GLUCOSEU NEGATIVE 05/12/2018 1524   HGBUR NEGATIVE 05/12/2018 1524   [place] 05/12/2018 1524  KETONESUR NEGATIVE 05/12/2018 1524   PROTEINUR 100 (A) 05/12/2018 1524   UROBILINOGEN 0.2 10/26/2012 0854   NITRITE NEGATIVE 05/12/2018 1524   LEUKOCYTESUR NEGATIVE 05/12/2018 1524   Sepsis Labs Invalid input(s): PROCALCITONIN,  WBC,  LACTICIDVEN Microbiology Recent Results (from the past 240 hour(s))  Urine culture     Status: Abnormal   Collection Time: 05/12/18  3:24 PM  Result Value Ref Range Status   Specimen Description URINE, RANDOM  Final   Special Requests NONE  Final   Culture (A)  Final    <10,000 COLONIES/mL INSIGNIFICANT GROWTH Performed at [facility], [place] [address], [place], [place] [address]    Report Status 05/13/2018 FINAL  Final  Respiratory Panel by PCR     Status: None   Collection  Time: 05/13/18  3:08 PM  Result Value Ref Range Status   Adenovirus NOT DETECTED NOT DETECTED Final   Coronavirus 229E NOT DETECTED NOT DETECTED Final   Coronavirus HKU1 NOT DETECTED NOT DETECTED Final   Coronavirus NL63 NOT DETECTED NOT DETECTED Final   Coronavirus OC43 NOT DETECTED NOT DETECTED Final   Metapneumovirus NOT DETECTED NOT DETECTED Final   Rhinovirus / Enterovirus NOT DETECTED NOT DETECTED Final   Influenza A NOT DETECTED NOT DETECTED Final   Influenza B NOT DETECTED NOT DETECTED Final   Parainfluenza Virus 1 NOT DETECTED NOT DETECTED Final   Parainfluenza Virus 2 NOT DETECTED NOT DETECTED Final   Parainfluenza Virus 3 NOT DETECTED NOT DETECTED Final   Parainfluenza Virus 4 NOT DETECTED NOT DETECTED Final   Respiratory Syncytial Virus NOT DETECTED NOT DETECTED Final   Bordetella pertussis NOT DETECTED NOT DETECTED Final   Chlamydophila pneumoniae NOT DETECTED NOT DETECTED Final   Mycoplasma pneumoniae NOT DETECTED NOT DETECTED Final    Comment: Performed at [facility], [place] [address] [place], [place] [address]     Time coordinating discharge: 48 minutes SIGNED:   Lady Deutscher, MD  FACP [facility] 05/15/2018, 1:49 PM Pager   If 7PM-7AM, please contact night-coverage [email or web address] Password [email or web address]

## 2018-05-18 ENCOUNTER — Telehealth: Payer: Self-pay | Admitting: Interventional Cardiology

## 2018-05-18 NOTE — Telephone Encounter (Signed)
New Message        Patient is calling today complaining about the oxygen that was sent home with her from the hospital. Patient states per Dr. Irish Lack she does not need the oxygen, but the discharge doctor insisted that she take it home with her. Patient states it makes her nervous and she also have neighbors that smoke. Pls call and advise.

## 2018-05-18 NOTE — Telephone Encounter (Signed)
Called patient back. Informed patient that Dr. Evangeline Gula, the hospitalist, ordered her oxygen. Gave patient the number to [facility] who is supplying her with oxygen and let them know her concerns. Advised patient not to have anyone who is using anything flamable in the same room with her and the oxygen. Patient wanted advise from Dr. Irish Lack. Informed patient that Dr. Irish Lack is not in the office, but he would not be the provider to address this. Encouraged patient to call [facility] or her PCP for further advisement. Patient verbalized understanding and will call [place].

## 2018-05-31 NOTE — Progress Notes (Deleted)
@Patient  ID: Amanda Cain, female    DOB: 16-Jan-1946, 73 y.o.   MRN: [id number]  No chief complaint on file.   Referring provider: Mayra Neer, MD  HPI:   PMH:  Smoker/ Smoking History:  Maintenance:   Pt of:   Recent [facility] Encounters:   HPI: 73 year old with history of hypertension, coronary artery disease, type 2 diabetes, chronic kidney disease, asthma She has recurrent admissions for respiratory failure secondary to heart failure, respiratory infection.  Her chief complaint is dyspnea with activity and occasionally at rest. She has productive cough with greenish sputum.  Initially on trelegy inhaler.  This was changed to [facility] as the diagnosis was asthma and not COPD.  - She had an admission on 11/20/16 for acute on chronic diastolic CHF. - Hospitalized for flu infection.  She was discharged on 07/08/17 after treatment with Tamiflu and prednisone taper. - Hospitalized for acute respiratory failure, community-acquired pneumonia, CHF on 7/16. Had a right heart cath with elevated left and right heart filling pressures, pulmonary hypertension.  She was diuresed with improvement in symptoms.  Discharged on a higher dose of torsemide  Pets:None Occupation:Clerical work, retired Exposures: None. No mold issues.  Smoking history:Never smoker  Interim history: Been to the ED on 03/31/2018 with diarrhea, cough, CHF.  Symptoms improved with IV Lasix and duo nebs.  Chest x-ray showed vascular congestion, no pneumonia.  She has followed up with cardiology since then.   Complains of chronic dyspnea on exertion, chronic cough, nonproductive in nature.  Continues on the [place].  05/31/2018  - Visit   HPI  Tests:  Imaging  Chest x-ray 11/17/16-CHF with interstitial edema CT neck 02/19/16-lung apices appear clear CT abdomen 11/19/11-lung bases appear clear. Chest x-ray 12/12/17- bibasilar atelectasis, basilar opacities.   Chest x-ray 03/31/2018- cardiomegaly, mild vascular  congestion. 05/14/2018-VQ scan-pulmonary embolism absent, very low probability of PE 05/13/2018-chest x-ray-cardiomegaly without acute disease   PFTs 04/05/17-unable to complete FENO 02/27/17- unable to complete FENO 08/23/17-unable to complete  Cardiac Echo 11/17/16 LVEF 60 to 17%, grade 2 diastolic dysfunction, high ventricular filling pressures. - Left ventricle: The cavity size was normal. There was moderate  Echo 11/14/17- Mild LVH, LVEF 61-60%, grade 2 diastolic dysfunction  Right heart catheterization 12/15/2017 Moderately to severely elevated left and right heart filling pressures and pulmonary hypertension. Normal Fick cardiac output/index.  RA (mean): 20 mmHg RV (S/EDP): 68/20 mmHg PA (S/D, mean): 66/30 (42) mmHg PCWP (mean): 30 mmHg  Ao sat: 99% (vial pulseoximeter) PA sat: 63%  Fick CO: 5.4 L/min Fick CI: 2.7 L/min/m^2  Labs:  05/13/2018-influenza panel-negative for A and B 05/13/2018-respiratory virus panel-none detected 73/71/0626-RSWNI metabolic panel- sodium 627, potassium 5.5 (potential hemolysis), CH-91, CO2 29, glucose 108, BUN 37, creatinine 2.25, GFR 24 03/50/0938-HWEXH metabolic panel- sodium 371, potassium 4.3, chloride 91, CO2 27, BUN 69, creatinine 2.52, GFR 21   05/12/2018-hospitalization-hypoxia/hyperkalemia Discharge date: 05/15/2018 Plan: Follow-up with PCP in 1 to 2 weeks, follow-up pulmonary 1 to 2 weeks, BMP and CBC in 1 week, questionable obstructive sleep apnea, d/c on 2L 02     FENO:  No results found for: NITRICOXIDE  PFT: No flowsheet data found.  Imaging: Dg Chest 2 View  Result Date: 05/13/2018 CLINICAL DATA:  Chest pain and shortness of breath yesterday. EXAM: CHEST - 2 VIEW COMPARISON:  PA and lateral chest 05/12/2018 and 03/31/2017. FINDINGS: Small focus of atelectasis in the lingula is noted. Heart size is enlarged. No consolidative process, pneumothorax or edema. No pleural effusion. No  acute or focal bony abnormality.  IMPRESSION: Cardiomegaly without acute disease. Electronically Signed   By: Inge Rise M.D.   On: 05/13/2018 15:04   Dg Chest 2 View  Result Date: 05/12/2018 CLINICAL DATA:  This of breath for a week. EXAM: CHEST - 2 VIEW COMPARISON:  March 31, 2018 FINDINGS: The heart size is borderline to mildly enlarged. Possible mild pulmonary venous congestion with no overt edema. No nodules, masses, or focal infiltrates. IMPRESSION: Mild cardiomegaly. Mild pulmonary venous congestion not excluded. No focal infiltrate. Electronically Signed   By: Dorise Bullion III M.D   On: 05/12/2018 15:53   Nm Pulmonary Perf And Vent  Result Date: 05/14/2018 CLINICAL DATA:  Shortness of breath and substernal chest pain. Evaluate for pulmonary embolism. EXAM: NUCLEAR MEDICINE VENTILATION - PERFUSION LUNG SCAN TECHNIQUE: Ventilation images were obtained in multiple projections using inhaled aerosol Tc-59m DTPA. Perfusion images were obtained in multiple projections after intravenous injection of Tc-80m MAA. RADIOPHARMACEUTICALS:  31.2 mCi of Tc-84m DTPA aerosol inhalation and 4.3 mCi Tc28m MAA IV COMPARISON:  Chest radiograph-05/13/2018; 05/12/2018; 03/31/2018; 07/05/2017 FINDINGS: Review of chest radiograph performed a prior demonstrates borderline enlarged cardiac silhouette and mediastinal contours. Minimal linear heterogeneous opacities with the peripheral aspect of the left mid lung. Pulmonary venous congestion without frank evidence of edema. No pleural effusion or pneumothorax. No acute osseous abnormalities. Ventilation: Ventilatory images demonstrates clumping of inhaled radiotracer about the bilateral pulmonary hila, right greater than left. Ingested radiotracer is seen within the hypopharynx, superior and distal aspects of the esophagus as well as the stomach. Perfusion: Perfusion images demonstrate homogeneous perfusion of the pulmonary parenchyma without discrete segmental or subsegmental mismatched filling  defect to suggest pulmonary embolism. IMPRESSION: Pulmonary embolism absent (very low probability of pulmonary embolism). Electronically Signed   By: Sandi Mariscal M.D.   On: 05/14/2018 11:20      Specialty Problems      Pulmonary Problems   Acute respiratory failure with hypoxia (HCC)   Influenza A   Flu   CAP (community acquired pneumonia)   Community acquired pneumonia   Acute on chronic respiratory failure with hypoxia (HCC)      Allergies  Allergen Reactions  . Aspirin     " Have an ulcer"    Immunization History  Administered Date(s) Administered  . Influenza Split 02/28/2016  . Influenza, High Dose Seasonal PF 03/12/2018  . Influenza,inj,Quad PF,6+ Mos 03/03/2015, 02/27/2017  . Pneumococcal Conjugate-13 02/28/2016    Past Medical History:  Diagnosis Date  . Anemia   . Anxiety    severe  . Arthritis   . Bronchitis    hx of  . CHF (congestive heart failure) ([place])   . Chronic kidney disease    "kidney disease stage 3"  . Depression   . Diabetes mellitus   . GERD (gastroesophageal reflux disease)   . Hx of gallstones   . Hypercholesteremia   . Hypertension   . Hypothyroidism     Tobacco History: Social History   Tobacco Use  Smoking Status Never Smoker  Smokeless Tobacco Never Used   Counseling given: Not Answered   Outpatient Encounter Medications as of 06/01/2018  Medication Sig  . acetaminophen (TYLENOL) 500 MG tablet Take 500 mg by mouth every 6 (six) hours as needed for mild pain.  Marland Kitchen albuterol (PROVENTIL HFA;VENTOLIN HFA) 108 (90 Base) MCG/ACT inhaler Inhale 2 puffs into the lungs every 4 (four) hours as needed for wheezing or shortness of breath.  Marland Kitchen albuterol (PROVENTIL) (2.5 MG/3ML) 0.083%  nebulizer solution Take 2.5 mg by nebulization every 4 (four) hours as needed for wheezing or shortness of breath.  Marland Kitchen atorvastatin (LIPITOR) 40 MG tablet Take 40 mg by mouth daily.   . benzonatate (TESSALON) 100 MG capsule Take 1 capsule (100 mg total) by  mouth 3 (three) times daily.  . Biotin 10000 MCG TABS Take 1 tablet by mouth daily.  Marland Kitchen BREO ELLIPTA 200-25 MCG/INH AEPB INHALE 1 PUFF INTO THE LUNGS DAILY  . cholecalciferol (VITAMIN D) 1000 units tablet Take 1,000 Units by mouth daily.  Marland Kitchen diltiazem (CARDIZEM CD) 360 MG 24 hr capsule Take 1 capsule by mouth daily.  Marland Kitchen escitalopram (LEXAPRO) 20 MG tablet Take 20 mg by mouth at bedtime.  Marland Kitchen estradiol (ESTRACE) 0.5 MG tablet Take 0.5 mg by mouth every morning.   . ezetimibe (ZETIA) 10 MG tablet Take 10 mg by mouth daily.  . ferrous sulfate 325 (65 FE) MG tablet Take 325 mg by mouth 2 (two) times daily with a meal.   . fluticasone (FLONASE) 50 MCG/ACT nasal spray Place 1 spray into both nostrils daily as needed for allergies.   Marland Kitchen glimepiride (AMARYL) 1 MG tablet TK 1 T PO QD WITH BRE OR THE FIRST MAIN MEAL OF THE DAY AND WITH BRE  . hydrALAZINE (APRESOLINE) 25 MG tablet Take 1 tablet (25 mg total) by mouth every 8 (eight) hours.  Marland Kitchen levothyroxine (SYNTHROID, LEVOTHROID) 125 MCG tablet Take 250 mcg by mouth daily before breakfast.   . LORazepam (ATIVAN) 0.5 MG tablet Take 0.5-1 mg by mouth 2 (two) times daily. Take one tablet in the morning and two tablets at bedtime.  . metoprolol succinate (TOPROL-XL) 100 MG 24 hr tablet Take 100 mg by mouth every morning.   . montelukast (SINGULAIR) 10 MG tablet take 1 tablet by mouth at bedtime  . Multiple Vitamin (MULITIVITAMIN WITH MINERALS) TABS Take 1 tablet by mouth daily.  . ondansetron (ZOFRAN-ODT) 4 MG disintegrating tablet Take 4 mg by mouth every 8 (eight) hours as needed for nausea or vomiting.  . paliperidone (INVEGA) 3 MG 24 hr tablet Take 3 mg by mouth at bedtime.  . pantoprazole (PROTONIX) 40 MG tablet Take 1 tablet (40 mg total) by mouth daily.  Marland Kitchen PAZEO 0.7 % SOLN Place 1 drop into both eyes daily.  Vladimir Faster Glycol-Propyl Glycol (SYSTANE) 0.4-0.3 % SOLN Apply 1 drop to eye daily as needed (for dry eyes).  . potassium chloride SA (K-DUR,KLOR-CON)  20 MEQ tablet Take 1 tablet (20 mEq total) by mouth 2 (two) times daily.  . Promethazine-Codeine 6.25-10 MG/5ML SOLN TAKE 1 TEASPOON AT BEDTIME AS NEEDED  . saccharomyces boulardii (FLORASTOR) 250 MG capsule Take 1 capsule (250 mg total) by mouth 2 (two) times daily.  . sitaGLIPtin (JANUVIA) 50 MG tablet Take 50 mg by mouth daily.  . sucralfate (CARAFATE) 1 G tablet Take 1 g by mouth 2 (two) times daily.  Marland Kitchen torsemide (DEMADEX) 100 MG tablet Take 100 mg by mouth daily.  . vitamin B-12 (CYANOCOBALAMIN) 1000 MCG tablet Take 1,000 mcg by mouth daily.  . vitamin C (ASCORBIC ACID) 500 MG tablet Take 500 mg by mouth daily.   No facility-administered encounter medications on file as of 06/01/2018.      Review of Systems  Review of Systems   Physical Exam  There were no vitals taken for this visit.  Wt Readings from Last 5 Encounters:  05/15/18 223 lb 12.3 oz (101.5 kg)  04/11/18 225 lb (102.1 kg)  04/04/18  223 lb 6.4 oz (101.3 kg)  02/16/18 222 lb (100.7 kg)  02/08/18 220 lb (99.8 kg)     Physical Exam    Lab Results:  CBC    Component Value Date/Time   WBC 9.0 05/14/2018 0537   RBC 3.71 (L) 05/14/2018 0537   HGB 10.1 (L) 05/14/2018 0537   HCT 32.7 (L) 05/14/2018 0537   PLT 330 05/14/2018 0537   MCV 88.1 05/14/2018 0537   MCH 27.2 05/14/2018 0537   MCHC 30.9 05/14/2018 0537   RDW 15.5 05/14/2018 0537   LYMPHSABS 1.2 12/12/2017 1813   MONOABS 0.6 12/12/2017 1813   EOSABS 0.1 12/12/2017 1813   BASOSABS 0.0 12/12/2017 1813    BMET    Component Value Date/Time   NA 131 (L) 05/15/2018 0856   NA 132 (L) 01/03/2018 0955   K 4.3 05/15/2018 0856   CL 91 (L) 05/15/2018 0856   CO2 27 05/15/2018 0856   GLUCOSE 170 (H) 05/15/2018 0856   BUN 69 (H) 05/15/2018 0856   BUN 34 (H) 01/03/2018 0955   CREATININE 2.52 (H) 05/15/2018 0856   CALCIUM 9.1 05/15/2018 0856   GFRNONAA 18 (L) 05/15/2018 0856   GFRAA 21 (L) 05/15/2018 0856    BNP    Component Value Date/Time   BNP  172.1 (H) 05/12/2018 1457    ProBNP    Component Value Date/Time   PROBNP 557 (H) 11/14/2017 0812   PROBNP 130.6 (H) 11/19/2011 2155      Assessment & Plan:     No problem-specific Assessment & Plan notes found for this encounter.     Lauraine Rinne, NP 05/31/2018   This appointment was *** with over 50% of the time in direct face-to-face patient care, assessment, plan of care, and follow-up.

## 2018-06-01 ENCOUNTER — Inpatient Hospital Stay: Payer: Medicare Other | Admitting: Pulmonary Disease
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
## 2018-06-04 ENCOUNTER — Ambulatory Visit: Payer: Medicare Other | Admitting: Cardiology

## 2018-06-07 ENCOUNTER — Encounter (HOSPITAL_COMMUNITY): Payer: Self-pay

## 2018-06-07 ENCOUNTER — Emergency Department (HOSPITAL_COMMUNITY): Payer: Medicare Other

## 2018-06-07 ENCOUNTER — Inpatient Hospital Stay: Payer: Medicare Other | Admitting: Nurse Practitioner

## 2018-06-07 ENCOUNTER — Emergency Department (HOSPITAL_COMMUNITY)
Admission: EM | Admit: 2018-06-07 | Discharge: 2018-06-07 | Disposition: A | Discharge status: Home / Self Care | Payer: Medicare Other | Attending: Emergency Medicine | Admitting: Emergency Medicine

## 2018-06-07 DIAGNOSIS — R0902 Hypoxemia: Secondary | ICD-10-CM | POA: Diagnosis not present

## 2018-06-07 DIAGNOSIS — I130 Hypertensive heart and chronic kidney disease with heart failure and stage 1 through stage 4 chronic kidney disease, or unspecified chronic kidney disease: Secondary | ICD-10-CM | POA: Diagnosis not present

## 2018-06-07 DIAGNOSIS — I509 Heart failure, unspecified: Secondary | ICD-10-CM | POA: Diagnosis not present

## 2018-06-07 DIAGNOSIS — Z7984 Long term (current) use of oral hypoglycemic drugs: Secondary | ICD-10-CM | POA: Diagnosis not present

## 2018-06-07 DIAGNOSIS — R079 Chest pain, unspecified: Secondary | ICD-10-CM | POA: Diagnosis present

## 2018-06-07 DIAGNOSIS — N183 Chronic kidney disease, stage 3 (moderate): Secondary | ICD-10-CM | POA: Insufficient documentation

## 2018-06-07 DIAGNOSIS — R072 Precordial pain: Secondary | ICD-10-CM | POA: Diagnosis not present

## 2018-06-07 DIAGNOSIS — Z9981 Dependence on supplemental oxygen: Secondary | ICD-10-CM | POA: Insufficient documentation

## 2018-06-07 DIAGNOSIS — E1122 Type 2 diabetes mellitus with diabetic chronic kidney disease: Secondary | ICD-10-CM | POA: Insufficient documentation

## 2018-06-07 DIAGNOSIS — Z79899 Other long term (current) drug therapy: Secondary | ICD-10-CM | POA: Insufficient documentation

## 2018-06-07 LAB — CBC
HCT: 31 % — ABNORMAL LOW (ref 36.0–46.0)
Hemoglobin: 9.2 g/dL — ABNORMAL LOW (ref 12.0–15.0)
MCH: 27.2 pg (ref 26.0–34.0)
MCHC: 29.7 g/dL — ABNORMAL LOW (ref 30.0–36.0)
MCV: 91.7 fL (ref 80.0–100.0)
Platelets: 293 10*3/uL (ref 150–400)
RBC: 3.38 MIL/uL — ABNORMAL LOW (ref 3.87–5.11)
RDW: 15.5 % (ref 11.5–15.5)
WBC: 9.8 10*3/uL (ref 4.0–10.5)
nRBC: 0 % (ref 0.0–0.2)

## 2018-06-07 LAB — BASIC METABOLIC PANEL
Anion gap: 9 (ref 5–15)
BUN: 30 mg/dL — ABNORMAL HIGH (ref 8–23)
CO2: 27 mmol/L (ref 22–32)
Calcium: 9 mg/dL (ref 8.9–10.3)
Chloride: 97 mmol/L — ABNORMAL LOW (ref 98–111)
Creatinine, Ser: 1.8 mg/dL — ABNORMAL HIGH (ref 0.44–1.00)
GFR calc Af Amer: 32 mL/min — ABNORMAL LOW (ref >60)
GFR calc non Af Amer: 27 mL/min — ABNORMAL LOW (ref >60)
Glucose, Bld: 103 mg/dL — ABNORMAL HIGH (ref 70–99)
Potassium: 4.2 mmol/L (ref 3.5–5.1)
Sodium: 133 mmol/L — ABNORMAL LOW (ref 135–145)

## 2018-06-07 LAB — I-STAT TROPONIN, ED
Troponin i, poc: 0 ng/mL (ref 0.00–0.08)
Troponin i, poc: 0 ng/mL (ref 0.00–0.08)

## 2018-06-07 MED ORDER — METOPROLOL SUCCINATE ER 100 MG PO TB24
100.0000 mg | ORAL_TABLET | Freq: Every day | ORAL | Status: DC
  Administered 2018-06-07: 100 mg via ORAL
  Filled 2018-06-07: qty 1

## 2018-06-07 MED ORDER — LORAZEPAM 1 MG PO TABS
0.5000 mg | ORAL_TABLET | Freq: Once | ORAL | Status: AC
  Administered 2018-06-07: 0.5 mg via ORAL
  Filled 2018-06-07: qty 1

## 2018-06-07 MED ORDER — IPRATROPIUM-ALBUTEROL 0.5-2.5 (3) MG/3ML IN SOLN
3.0000 mL | Freq: Once | RESPIRATORY_TRACT | Status: AC
  Administered 2018-06-07: 3 mL via RESPIRATORY_TRACT
  Filled 2018-06-07: qty 3

## 2018-06-07 MED ORDER — DILTIAZEM HCL ER COATED BEADS 360 MG PO CP24
360.0000 mg | ORAL_CAPSULE | Freq: Once | ORAL | Status: AC
  Administered 2018-06-07: 360 mg via ORAL
  Filled 2018-06-07: qty 1

## 2018-06-07 MED ORDER — HYDRALAZINE HCL 25 MG PO TABS
25.0000 mg | ORAL_TABLET | Freq: Once | ORAL | Status: AC
  Administered 2018-06-07: 25 mg via ORAL
  Filled 2018-06-07: qty 1

## 2018-06-07 MED ORDER — PANTOPRAZOLE SODIUM 20 MG PO TBEC
20.0000 mg | DELAYED_RELEASE_TABLET | Freq: Once | ORAL | Status: AC
  Administered 2018-06-07: 20 mg via ORAL
  Filled 2018-06-07: qty 1

## 2018-06-07 MED ORDER — GLIMEPIRIDE 1 MG PO TABS
1.0000 mg | ORAL_TABLET | Freq: Every day | ORAL | Status: DC
  Administered 2018-06-07: 1 mg via ORAL
  Filled 2018-06-07 (×2): qty 1

## 2018-06-07 NOTE — ED Triage Notes (Signed)
Pt presents for evaluation of chest pain. Pain is pinpoint, substernal starting at 6 am. Allergic to ASA, 3 nitro in route. Recent CHF exacerbation. No SOB or dizziness.

## 2018-06-07 NOTE — ED Notes (Signed)
Patient transported to X-ray 

## 2018-06-07 NOTE — ED Notes (Signed)
Pt. Continues to ask for her sisters. Pt. Stated "my aide called my sisters and they said they are on the way" NT has checked lobby twice for pt. Sisters security stated no one has come for asking for pt. yet
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
## 2018-06-07 NOTE — Discharge Instructions (Addendum)
1.  Continue your home oxygen at 2 L as per your instructions. 2.  Continue all of your home medications. 3.  Call your cardiologist office today to schedule a recheck as soon as possible. 4.  Return to the emergency department if you develop new or worsening symptoms.

## 2018-06-18 NOTE — Progress Notes (Deleted)
@Patient  ID: Amanda Cain, female    DOB: March 19, 1946, 73 y.o.   MRN: [id number]  No chief complaint on file.   Referring provider: Mayra Neer, MD  HPI:  73 year old female never smoker followed in our office for Asthma  PMH: hypertension, coronary artery disease, type 2 diabetes, chronic kidney disease, Smoker/ Smoking History: Never Smoker  Maintenance:   Pt of: Dr. Vaughan Browner  06/18/2018  - Visit   HPI  Tests:  Imaging  Chest x-ray 11/17/16-CHF with interstitial edema CT neck 02/19/16-lung apices appear clear CT abdomen 11/19/11-lung bases appear clear. Chest x-ray 12/12/17- bibasilar atelectasis, basilar opacities.   Chest x-ray 03/31/2018- cardiomegaly, mild vascular congestion. 05/14/2018-VQ scan-pulmonary embolism absent, very low probability of PE 05/13/2018-chest x-ray-cardiomegaly without acute disease   PFTs 04/05/17-unable to complete FENO 02/27/17- unable to complete FENO 08/23/17-unable to complete  Cardiac Echo 11/17/16 LVEF 60 to 40%, grade 2 diastolic dysfunction, high ventricular filling pressures. - Left ventricle: The cavity size was normal. There was moderate  Echo 11/14/17- Mild LVH, LVEF 98-11%, grade 2 diastolic dysfunction  Right heart catheterization 12/15/2017 Moderately to severely elevated left and right heart filling pressures and pulmonary hypertension. Normal Fick cardiac output/index.  RA (mean): 20 mmHg RV (S/EDP): 68/20 mmHg PA (S/D, mean): 66/30 (42) mmHg PCWP (mean): 30 mmHg  Ao sat: 99% (vial pulseoximeter) PA sat: 63%  Fick CO: 5.4 L/min Fick CI: 2.7 L/min/m^2  Labs:  05/13/2018-influenza panel-negative for A and B 05/13/2018-respiratory virus panel-none detected 91/47/8295-[id number] metabolic panel- sodium 865, potassium 5.5 (potential hemolysis), CH-91, CO2 29, glucose 108, BUN 37, creatinine 2.25, GFR 24 78/46/[id number]-[id number] metabolic panel- sodium 324, potassium 4.3, chloride 91, CO2 27, BUN 69, creatinine 2.52, GFR  21   05/12/2018-hospitalization-hypoxia/hyperkalemia Discharge date: 05/15/2018 Plan: Follow-up with PCP in 1 to 2 weeks, follow-up pulmonary 1 to 2 weeks, BMP and CBC in 1 week, questionable obstructive sleep apnea, d/c on 2L 02   FENO:  No results found for: NITRICOXIDE  PFT: No flowsheet data found.  Imaging: Dg Chest 2 View  Result Date: 06/07/2018 CLINICAL DATA:  Chest pain for 1 day, history CHF, hypertension, diabetes mellitus, stage III chronic kidney disease EXAM: CHEST - 2 VIEW COMPARISON:  05/13/2018 FINDINGS: Enlargement of cardiac silhouette. Mediastinal contours and pulmonary vascularity normal. Subsegmental atelectasis mid to lower LEFT lung, chronic. Slight accentuation of interstitial markings, more prominent than on previous exam suspicious for mild pulmonary edema. Tiny pleural effusions blunt the posterior costophrenic angles. No segmental consolidation or pneumothorax. Bones unremarkable. IMPRESSION: Enlargement of cardiac silhouette with minimal diffuse interstitial prominence question mild pulmonary edema. Tiny bibasilar effusions and lingular subsegmental atelectasis. Electronically Signed   By: Lavonia Dana M.D.   On: 06/07/2018 10:03      Specialty Problems      Pulmonary Problems   Acute respiratory failure with hypoxia (HCC)   Influenza A   Flu   CAP (community acquired pneumonia)   Community acquired pneumonia   Acute on chronic respiratory failure with hypoxia (HCC)      Allergies  Allergen Reactions  . Aspirin Other (See Comments)    " Have an ulcer"    Immunization History  Administered Date(s) Administered  . Influenza Split 02/28/2016  . Influenza, High Dose Seasonal PF 03/12/2018  . Influenza,inj,Quad PF,6+ Mos 03/03/2015, 02/27/2017  . Pneumococcal Conjugate-13 02/28/2016    Past Medical History:  Diagnosis Date  . Anemia   . Anxiety    severe  . Arthritis   . Bronchitis  hx of  . CHF (congestive heart failure) ([place])   .  Chronic kidney disease    "kidney disease stage 3"  . Depression   . Diabetes mellitus   . GERD (gastroesophageal reflux disease)   . Hx of gallstones   . Hypercholesteremia   . Hypertension   . Hypothyroidism     Tobacco History: Social History   Tobacco Use  Smoking Status Never Smoker  Smokeless Tobacco Never Used   Counseling given: Not Answered   Outpatient Encounter Medications as of 06/19/2018  Medication Sig  . acetaminophen (TYLENOL) 500 MG tablet Take 500 mg by mouth daily as needed for mild pain.   Marland Kitchen albuterol (PROVENTIL HFA;VENTOLIN HFA) 108 (90 Base) MCG/ACT inhaler Inhale 2 puffs into the lungs every 4 (four) hours as needed for wheezing or shortness of breath.  Marland Kitchen atorvastatin (LIPITOR) 40 MG tablet Take 40 mg by mouth daily.   . benzonatate (TESSALON) 100 MG capsule Take 1 capsule (100 mg total) by mouth 3 (three) times daily.  . Biotin 10000 MCG TABS Take 1 tablet by mouth daily.  Marland Kitchen BREO ELLIPTA 200-25 MCG/INH AEPB INHALE 1 PUFF INTO THE LUNGS DAILY (Patient taking differently: Inhale 1 puff into the lungs daily. )  . cholecalciferol (VITAMIN D) 1000 units tablet Take 1,000 Units by mouth daily.  Marland Kitchen diltiazem (CARDIZEM CD) 360 MG 24 hr capsule Take 1 capsule by mouth daily.  Marland Kitchen escitalopram (LEXAPRO) 20 MG tablet Take 20 mg by mouth at bedtime.  Marland Kitchen estradiol (ESTRACE) 0.5 MG tablet Take 0.5 mg by mouth every morning.   . ezetimibe (ZETIA) 10 MG tablet Take 10 mg by mouth daily.  . ferrous sulfate 325 (65 FE) MG tablet Take 325 mg by mouth 2 (two) times daily with a meal.   . fluticasone (FLONASE) 50 MCG/ACT nasal spray Place 1 spray into both nostrils daily as needed for allergies.   Marland Kitchen glimepiride (AMARYL) 1 MG tablet Take 1 mg by mouth daily with breakfast.   . hydrALAZINE (APRESOLINE) 25 MG tablet Take 1 tablet (25 mg total) by mouth every 8 (eight) hours.  Marland Kitchen levothyroxine (SYNTHROID, LEVOTHROID) 125 MCG tablet Take 250 mcg by mouth daily before breakfast.   .  LORazepam (ATIVAN) 0.5 MG tablet Take 0.5-1 mg by mouth See admin instructions. Take one tablet in the morning and two tablets at bedtime.  . metoprolol succinate (TOPROL-XL) 100 MG 24 hr tablet Take 100 mg by mouth every morning.   . montelukast (SINGULAIR) 10 MG tablet take 1 tablet by mouth at bedtime  . Multiple Vitamin (MULITIVITAMIN WITH MINERALS) TABS Take 1 tablet by mouth daily.  . ondansetron (ZOFRAN-ODT) 4 MG disintegrating tablet Take 4 mg by mouth every 8 (eight) hours as needed for nausea or vomiting.  . paliperidone (INVEGA) 3 MG 24 hr tablet Take 3 mg by mouth at bedtime.  . pantoprazole (PROTONIX) 40 MG tablet Take 1 tablet (40 mg total) by mouth daily.  Vladimir Faster Glycol-Propyl Glycol (SYSTANE) 0.4-0.3 % SOLN Apply 1 drop to eye daily as needed (for dry eyes).  . potassium chloride SA (K-DUR,KLOR-CON) 20 MEQ tablet Take 1 tablet (20 mEq total) by mouth 2 (two) times daily.  . Promethazine-Codeine 6.25-10 MG/5ML SOLN Take 5 mLs by mouth at bedtime as needed (cough/cold).   . saccharomyces boulardii (FLORASTOR) 250 MG capsule Take 1 capsule (250 mg total) by mouth 2 (two) times daily.  . sitaGLIPtin (JANUVIA) 50 MG tablet Take 50 mg by mouth daily.  Marland Kitchen  sucralfate (CARAFATE) 1 G tablet Take 1 g by mouth 2 (two) times daily.  Marland Kitchen torsemide (DEMADEX) 100 MG tablet Take 100 mg by mouth daily.  . vitamin B-12 (CYANOCOBALAMIN) 1000 MCG tablet Take 1,000 mcg by mouth daily.  . vitamin C (ASCORBIC ACID) 500 MG tablet Take 500 mg by mouth daily.   No facility-administered encounter medications on file as of 06/19/2018.      Review of Systems  Review of Systems   Physical Exam  There were no vitals taken for this visit.  Wt Readings from Last 5 Encounters:  05/15/18 223 lb 12.3 oz (101.5 kg)  04/11/18 225 lb (102.1 kg)  04/04/18 223 lb 6.4 oz (101.3 kg)  02/16/18 222 lb (100.7 kg)  02/08/18 220 lb (99.8 kg)     Physical Exam    Lab Results:  CBC    Component Value  Date/Time   WBC 9.8 06/07/2018 0803   RBC 3.38 (L) 06/07/2018 0803   HGB 9.2 (L) 06/07/2018 0803   HCT 31.0 (L) 06/07/2018 0803   PLT 293 06/07/2018 0803   MCV 91.7 06/07/2018 0803   MCH 27.2 06/07/2018 0803   MCHC 29.7 (L) 06/07/2018 0803   RDW 15.5 06/07/2018 0803   LYMPHSABS 1.2 12/12/2017 1813   MONOABS 0.6 12/12/2017 1813   EOSABS 0.1 12/12/2017 1813   BASOSABS 0.0 12/12/2017 1813    BMET    Component Value Date/Time   NA 133 (L) 06/07/2018 0803   NA 132 (L) 01/03/2018 0955   K 4.2 06/07/2018 0803   CL 97 (L) 06/07/2018 0803   CO2 27 06/07/2018 0803   GLUCOSE 103 (H) 06/07/2018 0803   BUN 30 (H) 06/07/2018 0803   BUN 34 (H) 01/03/2018 0955   CREATININE 1.80 (H) 06/07/2018 0803   CALCIUM 9.0 06/07/2018 0803   GFRNONAA 27 (L) 06/07/2018 0803   GFRAA 32 (L) 06/07/2018 0803    BNP    Component Value Date/Time   BNP 172.1 (H) 05/12/2018 1457    ProBNP    Component Value Date/Time   PROBNP 557 (H) 11/14/2017 0812   PROBNP 130.6 (H) 11/19/2011 2155      Assessment & Plan:     No problem-specific Assessment & Plan notes found for this encounter.     Lauraine Rinne, NP 06/18/2018   This appointment was *** with over 50% of the time in direct face-to-face patient care, assessment, plan of care, and follow-up.

## 2018-06-19 ENCOUNTER — Ambulatory Visit (INDEPENDENT_AMBULATORY_CARE_PROVIDER_SITE_OTHER): Payer: Medicare Other | Admitting: Pulmonary Disease

## 2018-06-19 ENCOUNTER — Encounter: Payer: Self-pay | Admitting: Pulmonary Disease

## 2018-06-19 ENCOUNTER — Inpatient Hospital Stay: Payer: Medicare Other | Admitting: Pulmonary Disease

## 2018-06-19 VITALS — BP 124/52 | Ht 59.75 in | Wt 222.8 lb

## 2018-06-19 DIAGNOSIS — J9601 Acute respiratory failure with hypoxia: Secondary | ICD-10-CM | POA: Diagnosis not present

## 2018-06-19 DIAGNOSIS — J45901 Unspecified asthma with (acute) exacerbation: Secondary | ICD-10-CM | POA: Insufficient documentation

## 2018-06-19 DIAGNOSIS — J4540 Moderate persistent asthma, uncomplicated: Secondary | ICD-10-CM

## 2018-06-19 DIAGNOSIS — I5032 Chronic diastolic (congestive) heart failure: Secondary | ICD-10-CM | POA: Diagnosis not present

## 2018-06-19 DIAGNOSIS — J45909 Unspecified asthma, uncomplicated: Secondary | ICD-10-CM | POA: Insufficient documentation

## 2018-06-19 NOTE — Assessment & Plan Note (Addendum)
Assessment: SPO2 93% on room air today Patient is instructed to be on 3 L at rest as well as with activity Patient would like to come off of oxygen Patient's mobility is limited as she is scared to walk with her oxygen  Plan: Walk today in office on room air >>> Patient needs 2 L of O2 with exertion >>> Does not need oxygen at rest Patient sister purchased patient a SPO2 monitor to monitor oxygen saturations at home

## 2018-06-19 NOTE — Patient Instructions (Addendum)
Breo Ellipta 200 >>> Take 1 puff daily in the morning right when you wake up >>>Rinse your mouth out after use >>>This is a daily maintenance inhaler, NOT a rescue inhaler >>>Contact our office if you are having difficulties affording or obtaining this medication >>>It is important for you to be able to take this daily and not miss any doses   Only use your albuterol as a rescue medication to be used if you can't catch your breath by resting or doing a relaxed purse lip breathing pattern.  - The less you use it, the better it will work when you need it. - Ok to use up to 2 puffs  every 4 hours if you must but call for immediate appointment if use goes up over your usual need - Don't leave home without it !!  (think of it like the spare tire for your car)   Continue oxygen therapy as prescribed  - 2L with exertion  >>>maintain oxygen saturations greater than 88 percent  >>>if unable to maintain oxygen saturations please contact the office  >>>do not smoke with oxygen  >>>can use nasal saline gel or nasal saline rinses to moisturize nose if oxygen causes dryness   Increase physical activity every day  Walk during commercial breaks when watching TV  Can start doing Silver sneakers   Keep follow-up with primary care Continue follow-up with cardiology >>> Weigh yourself daily to monitor for fluid >>> Follow low-sodium diet  Follow-up with our office in 6 to 8 weeks   It is flu season:   >>>Remember to be washing your hands regularly, using hand sanitizer, be careful to use around herself with has contact with people who are sick will increase her chances of getting sick yourself. >>> Best ways to protect herself from the flu: Receive the yearly flu vaccine, practice good hand hygiene washing with soap and also using hand sanitizer when available, eat a nutritious meals, get adequate rest, hydrate appropriately   Please contact the office if your symptoms worsen or you have concerns  that you are not improving.   Thank you for choosing [facility] for your healthcare, and for allowing [place] to partner with you on your healthcare journey. I am thankful to be able to provide care to you today.   Wyn Quaker FNP-C

## 2018-06-19 NOTE — Assessment & Plan Note (Signed)
Assessment: BMI 43.8 Very deconditioned elderly female Sedentary lifestyle  Plan: Increase daily activity Use 2 L of O2 with exertion Follow-up with our office in 6 to 8 weeks

## 2018-06-19 NOTE — Assessment & Plan Note (Signed)
Assessment: Morbidly obese elderly female Maintained well on Breo Ellipta inhaler Saba use twice daily Very sedentary lifestyle Still maintained on 3 L nasal cannula but feels she does not need Diminished breath sounds on exam Lungs are clear to auscultation  Plan: Continue Breo Ellipta inhaler daily Use your rescue inhaler every 6 hours as needed for shortness of breath and wheezing Contact our office if you are having to use your rescue More often than usual Walk in office today Contact our office if your respiratory symptoms are worsening Actively work on losing weight Increase activity daily Follow-up in 6 to 8 weeks

## 2018-06-19 NOTE — Progress Notes (Signed)
@Patient  ID: Amanda Cain, female    DOB: [birth year], 73 y.o.   MRN: [id number]  Chief Complaint  Patient presents with  . Hospitalization Follow-up    Dec / 2019 - SOB hypoxia     Referring provider: Mayra Neer, MD  HPI:  73 year old female never smoker followed in our office for Asthma  PMH: hypertension, coronary artery disease, type 2 diabetes, chronic kidney disease, Smoker/ Smoking History: Never Smoker  Maintenance: Breo 200 Pt of: Dr. Vaughan Browner  06/19/2018  - Visit   73 year old female never smoker presenting to our office today for hospital follow-up.  Patient was last admitted in the hospital in December/2019 for hypoxia and was discharged on oxygen.  Patient was also seen in the emergency department in January for chest pain.  Patient denies recent antibiotic use or prednisone use at this time.  Patient reports she has been adherent to her Brio Ellipta 200 inhaler.  Uses her rescue inhaler 2 times a day.  She uses this more out of habit than need.  Patient reports she is recently completed follow-up with primary care and plans to see cardiologist next week.  Patient reports that her shortness of breath has been stable she feels like she is almost back to baseline.  Patient does want to come off of oxygen.  She is currently maintained on 3 L via nasal cannula.  When patient was placed on room air in our office sats were 92%.  Will further address and evaluate at the end of office visit today.  Patient leads a very sedentary life.  Mainly stays in the recliner.  Younger sister is present with patient today and has concerns regarding her lack of exercise lately.   Tests:  Imaging  Chest x-ray 11/17/16-CHF with interstitial edema CT neck 02/19/16-lung apices appear clear CT abdomen 11/19/11-lung bases appear clear. Chest x-ray 12/12/17- bibasilar atelectasis, basilar opacities.   Chest x-ray 03/31/2018- cardiomegaly, mild vascular congestion. 05/14/2018-VQ scan-pulmonary  embolism absent, very low probability of PE 05/13/2018-chest x-ray-cardiomegaly without acute disease   PFTs 04/05/17-unable to complete FENO 02/27/17- unable to complete FENO 08/23/17-unable to complete  Cardiac Echo 11/17/16 LVEF 60 to 53%, grade 2 diastolic dysfunction, high ventricular filling pressures. - Left ventricle: The cavity size was normal. There was moderate  Echo 11/14/17- Mild LVH, LVEF 66-44%, grade 2 diastolic dysfunction  Right heart catheterization 12/15/2017 Moderately to severely elevated left and right heart filling pressures and pulmonary hypertension. Normal Fick cardiac output/index.  RA (mean): 20 mmHg RV (S/EDP): 68/20 mmHg PA (S/D, mean): 66/30 (42) mmHg PCWP (mean): 30 mmHg  Ao sat: 99% (vial pulseoximeter) PA sat: 63%  Fick CO: 5.4 L/min Fick CI: 2.7 L/min/m^2  Labs:  05/13/2018-influenza panel-negative for A and B 05/13/2018-respiratory virus panel-none detected 03/47/4259-DGLOV metabolic panel- sodium 564, potassium 5.5 (potential hemolysis), CH-91, CO2 29, glucose 108, BUN 37, creatinine 2.25, GFR 24 33/29/5188-CZYSA metabolic panel- sodium 630, potassium 4.3, chloride 91, CO2 27, BUN 69, creatinine 2.52, GFR 21   05/12/2018-hospitalization-hypoxia/hyperkalemia Discharge date: 05/15/2018 Plan: Follow-up with PCP in 1 to 2 weeks, follow-up pulmonary 1 to 2 weeks, BMP and CBC in 1 week, questionable obstructive sleep apnea, d/c on 2L 02   FENO:  No results found for: NITRICOXIDE  PFT: No flowsheet data found.  Imaging: Dg Chest 2 View  Result Date: 06/07/2018 CLINICAL DATA:  Chest pain for 1 day, history CHF, hypertension, diabetes mellitus, stage III chronic kidney disease EXAM: CHEST - 2 VIEW COMPARISON:  05/13/2018 FINDINGS:  Enlargement of cardiac silhouette. Mediastinal contours and pulmonary vascularity normal. Subsegmental atelectasis mid to lower LEFT lung, chronic. Slight accentuation of interstitial markings, more prominent than on  previous exam suspicious for mild pulmonary edema. Tiny pleural effusions blunt the posterior costophrenic angles. No segmental consolidation or pneumothorax. Bones unremarkable. IMPRESSION: Enlargement of cardiac silhouette with minimal diffuse interstitial prominence question mild pulmonary edema. Tiny bibasilar effusions and lingular subsegmental atelectasis. Electronically Signed   By: Lavonia Dana M.D.   On: 06/07/2018 10:03      Specialty Problems      Pulmonary Problems   Acute respiratory failure with hypoxia (HCC)   Influenza A   Flu   CAP (community acquired pneumonia)   Community acquired pneumonia   Acute on chronic respiratory failure with hypoxia (HCC)   Asthma      Allergies  Allergen Reactions  . Aspirin Other (See Comments)    " Have an ulcer"    Immunization History  Administered Date(s) Administered  . Influenza Split 02/28/2016  . Influenza, High Dose Seasonal PF 03/12/2018  . Influenza,inj,Quad PF,6+ Mos 03/03/2015, 02/27/2017  . Pneumococcal Conjugate-13 02/28/2016    Past Medical History:  Diagnosis Date  . Anemia   . Anxiety    severe  . Arthritis   . Bronchitis    hx of  . CHF (congestive heart failure) ([place])   . Chronic kidney disease    "kidney disease stage 3"  . Depression   . Diabetes mellitus   . GERD (gastroesophageal reflux disease)   . Hx of gallstones   . Hypercholesteremia   . Hypertension   . Hypothyroidism     Tobacco History: Social History   Tobacco Use  Smoking Status Never Smoker  Smokeless Tobacco Never Used   Counseling given: Yes   Outpatient Encounter Medications as of 06/19/2018  Medication Sig  . acetaminophen (TYLENOL) 500 MG tablet Take 500 mg by mouth daily as needed for mild pain.   Marland Kitchen albuterol (PROVENTIL HFA;VENTOLIN HFA) 108 (90 Base) MCG/ACT inhaler Inhale 2 puffs into the lungs every 4 (four) hours as needed for wheezing or shortness of breath.  Marland Kitchen atorvastatin (LIPITOR) 40 MG tablet Take 40 mg by  mouth daily.   . Biotin 10000 MCG TABS Take 1 tablet by mouth daily.  Marland Kitchen BREO ELLIPTA 200-25 MCG/INH AEPB INHALE 1 PUFF INTO THE LUNGS DAILY (Patient taking differently: Inhale 1 puff into the lungs daily. )  . cholecalciferol (VITAMIN D) 1000 units tablet Take 1,000 Units by mouth daily.  Marland Kitchen diltiazem (CARDIZEM CD) 360 MG 24 hr capsule Take 1 capsule by mouth daily.  Marland Kitchen escitalopram (LEXAPRO) 20 MG tablet Take 20 mg by mouth at bedtime.  Marland Kitchen estradiol (ESTRACE) 0.5 MG tablet Take 0.5 mg by mouth every morning.   . ezetimibe (ZETIA) 10 MG tablet Take 10 mg by mouth daily.  . ferrous sulfate 325 (65 FE) MG tablet Take 325 mg by mouth 2 (two) times daily with a meal.   . glimepiride (AMARYL) 1 MG tablet Take 1 mg by mouth daily with breakfast.   . levothyroxine (SYNTHROID, LEVOTHROID) 125 MCG tablet Take 250 mcg by mouth daily before breakfast.   . LORazepam (ATIVAN) 0.5 MG tablet Take 0.5-1 mg by mouth See admin instructions. Take one tablet in the morning and two tablets at bedtime.  . metoprolol succinate (TOPROL-XL) 100 MG 24 hr tablet Take 100 mg by mouth every morning.   . montelukast (SINGULAIR) 10 MG tablet take 1 tablet by mouth  at bedtime  . Multiple Vitamin (MULITIVITAMIN WITH MINERALS) TABS Take 1 tablet by mouth daily.  . ondansetron (ZOFRAN-ODT) 4 MG disintegrating tablet Take 4 mg by mouth every 8 (eight) hours as needed for nausea or vomiting.  . paliperidone (INVEGA) 3 MG 24 hr tablet Take 3 mg by mouth at bedtime.  . pantoprazole (PROTONIX) 40 MG tablet Take 1 tablet (40 mg total) by mouth daily.  Vladimir Faster Glycol-Propyl Glycol (SYSTANE) 0.4-0.3 % SOLN Apply 1 drop to eye daily as needed (for dry eyes).  . potassium chloride SA (K-DUR,KLOR-CON) 20 MEQ tablet Take 1 tablet (20 mEq total) by mouth 2 (two) times daily.  . Promethazine-Codeine 6.25-10 MG/5ML SOLN Take 5 mLs by mouth at bedtime as needed (cough/cold).   . saccharomyces boulardii (FLORASTOR) 250 MG capsule Take 1 capsule  (250 mg total) by mouth 2 (two) times daily.  . sitaGLIPtin (JANUVIA) 50 MG tablet Take 50 mg by mouth daily.  . sucralfate (CARAFATE) 1 G tablet Take 1 g by mouth 2 (two) times daily.  Marland Kitchen torsemide (DEMADEX) 100 MG tablet Take 100 mg by mouth daily.  . vitamin B-12 (CYANOCOBALAMIN) 1000 MCG tablet Take 1,000 mcg by mouth daily.  . vitamin C (ASCORBIC ACID) 500 MG tablet Take 500 mg by mouth daily.  . fluticasone (FLONASE) 50 MCG/ACT nasal spray Place 1 spray into both nostrils daily as needed for allergies.   . hydrALAZINE (APRESOLINE) 25 MG tablet Take 1 tablet (25 mg total) by mouth every 8 (eight) hours.  . [DISCONTINUED] benzonatate (TESSALON) 100 MG capsule Take 1 capsule (100 mg total) by mouth 3 (three) times daily. (Patient not taking: Reported on 06/19/2018)   No facility-administered encounter medications on file as of 06/19/2018.      Review of Systems  Review of Systems  Constitutional: Negative for fatigue and fever.  HENT: Negative for congestion, sinus pressure and sinus pain.   Respiratory: Positive for cough (occasionally productive ). Negative for chest tightness, shortness of breath and wheezing.   Cardiovascular: Negative for chest pain and palpitations.  Gastrointestinal: Positive for nausea (occasional - has prns at home per pt). Negative for diarrhea and vomiting.  Musculoskeletal: Negative for gait problem.  Neurological: Negative for dizziness, light-headedness and headaches.  Psychiatric/Behavioral: Negative for dysphoric mood. The patient is not nervous/anxious.      Physical Exam  BP (!) 124/52 (BP Location: Left Wrist, Cuff Size: Normal)   Ht 4' 11.75" (1.518 m)   Wt 222 lb 12.8 oz (101.1 kg)   SpO2 94%   BMI 43.88 kg/m   Wt Readings from Last 5 Encounters:  06/19/18 222 lb 12.8 oz (101.1 kg)  05/15/18 223 lb 12.3 oz (101.5 kg)  04/11/18 225 lb (102.1 kg)  04/04/18 223 lb 6.4 oz (101.3 kg)  02/16/18 222 lb (100.7 kg)     Physical Exam    Constitutional: She is oriented to person, place, and time and well-developed, well-nourished, and in no distress. Vital signs are normal. No distress.  + Morbidly obese elderly female  HENT:  Head: Normocephalic and atraumatic.  Right Ear: Hearing, tympanic membrane, external ear and ear canal normal.  Left Ear: Hearing, tympanic membrane, external ear and ear canal normal.  Nose: Nose normal. Right sinus exhibits no maxillary sinus tenderness and no frontal sinus tenderness. Left sinus exhibits no maxillary sinus tenderness and no frontal sinus tenderness.  Mouth/Throat: Uvula is midline and oropharynx is clear and moist. No oropharyngeal exudate.  Eyes: Pupils are equal, round, and  reactive to light.  Neck: Normal range of motion. Neck supple.  Cardiovascular: Normal rate, regular rhythm and normal heart sounds.  Pulmonary/Chest: Effort normal and breath sounds normal. No accessory muscle usage. No respiratory distress. She has no decreased breath sounds. She has no wheezes. She has no rhonchi.  + Diminished breath sounds throughout exam  Abdominal: Soft. Bowel sounds are normal. There is no abdominal tenderness.  + Truncal obesity  Musculoskeletal: Normal range of motion.        General: Edema (1+ lower extremity bilaterally) present.  Lymphadenopathy:    She has no cervical adenopathy.  Neurological: She is alert and oriented to person, place, and time. Gait normal.  Skin: Skin is warm and dry. She is not diaphoretic. No erythema.  Psychiatric: Mood, memory, affect and judgment normal.  Nursing note and vitals reviewed.     Lab Results:  CBC    Component Value Date/Time   WBC 9.8 06/07/2018 0803   RBC 3.38 (L) 06/07/2018 0803   HGB 9.2 (L) 06/07/2018 0803   HCT 31.0 (L) 06/07/2018 0803   PLT 293 06/07/2018 0803   MCV 91.7 06/07/2018 0803   MCH 27.2 06/07/2018 0803   MCHC 29.7 (L) 06/07/2018 0803   RDW 15.5 06/07/2018 0803   LYMPHSABS 1.2 12/12/2017 1813   MONOABS 0.6  12/12/2017 1813   EOSABS 0.1 12/12/2017 1813   BASOSABS 0.0 12/12/2017 1813    BMET    Component Value Date/Time   NA 133 (L) 06/07/2018 0803   NA 132 (L) 01/03/2018 0955   K 4.2 06/07/2018 0803   CL 97 (L) 06/07/2018 0803   CO2 27 06/07/2018 0803   GLUCOSE 103 (H) 06/07/2018 0803   BUN 30 (H) 06/07/2018 0803   BUN 34 (H) 01/03/2018 0955   CREATININE 1.80 (H) 06/07/2018 0803   CALCIUM 9.0 06/07/2018 0803   GFRNONAA 27 (L) 06/07/2018 0803   GFRAA 32 (L) 06/07/2018 0803    BNP    Component Value Date/Time   BNP 172.1 (H) 05/12/2018 1457    ProBNP    Component Value Date/Time   PROBNP 557 (H) 11/14/2017 0812   PROBNP 130.6 (H) 11/19/2011 2155      Assessment & Plan:     Asthma Assessment: Morbidly obese elderly female Maintained well on Breo Ellipta inhaler Saba use twice daily Very sedentary lifestyle Still maintained on 3 L nasal cannula but feels she does not need Diminished breath sounds on exam Lungs are clear to auscultation  Plan: Continue Breo Ellipta inhaler daily Use your rescue inhaler every 6 hours as needed for shortness of breath and wheezing Contact our office if you are having to use your rescue More often than usual Walk in office today Contact our office if your respiratory symptoms are worsening Actively work on losing weight Increase activity daily Follow-up in 6 to 8 weeks  CHF (congestive heart failure) ([place]) Plan: Weigh yourself daily Follow low-sodium diet Continue follow-up with cardiology next week  Acute respiratory failure with hypoxia ([place]) Assessment: SPO2 93% on room air today Patient is instructed to be on 3 L at rest as well as with activity Patient would like to come off of oxygen Patient's mobility is limited as she is scared to walk with her oxygen  Plan: Walk today in office on room air >>> Patient needs 2 L of O2 with exertion >>> Does not need oxygen at rest Patient sister purchased patient a SPO2  monitor to monitor oxygen saturations at home  Morbid obesity ([place]) Assessment: BMI 43.8 Very deconditioned elderly female Sedentary lifestyle  Plan: Increase daily activity Use 2 L of O2 with exertion Follow-up with our office in 6 to 8 weeks     Lauraine Rinne, NP 06/19/2018   This appointment was 42 min long with over 50% of the time in direct face-to-face patient care, assessment, plan of care, and follow-up.

## 2018-06-19 NOTE — Assessment & Plan Note (Signed)
Plan: Weigh yourself daily Follow low-sodium diet Continue follow-up with cardiology next week

## 2018-06-20 ENCOUNTER — Other Ambulatory Visit: Payer: Self-pay | Admitting: Pulmonary Disease

## 2018-06-27 ENCOUNTER — Ambulatory Visit: Payer: Medicare Other | Admitting: Podiatry
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
## 2018-07-05 ENCOUNTER — Ambulatory Visit: Payer: Medicare Other | Admitting: Interventional Cardiology

## 2018-07-11 ENCOUNTER — Ambulatory Visit: Payer: Medicare Other | Admitting: Podiatry

## 2018-07-13 ENCOUNTER — Telehealth: Payer: Self-pay | Admitting: Interventional Cardiology

## 2018-07-13 NOTE — Telephone Encounter (Signed)
Spoke with patient regarding message below. She states that she is suppose to wear continuous oxygen 2 L. She states that sometimes she takes it off because she doesn't like wearing it all of the time. She states that this morning she took off her oxygen and has been without it since then and was up moving around and she became SOB. She states that she checked her oxygen saturation and it was 81%. She states that she put her oxygen back on and her oxygen saturation is now 97% and she is no longer SOB and is currently asymptomatic. She states that her weight has been consistently between 214 and 215 lbs for the past week. She states that she did notice some extra swelling in her ankles and abdomen yesterday so she took an extra torsemide 100 mg tablet last night. Patient currently takes 100 mg QD and her PCP has instructed her to take an extra 100 mg prn. Patient states that her swelling is better today. Patient states that she did have bacon for breakfast this morning. Instructed the patient to wear her oxygen as prescribed. Educated the patient on low sodium diet. Instructed the patient to continue with daily weights. Appointment rescheduled sooner for 07/17/18 with Ermalinda Barrios, PA. ER precautions reviewed with the patient. Instructed the patient to let us know if her Sx change or worsen before then. Patient verbalized understanding and thanked me for the call.

## 2018-07-13 NOTE — Telephone Encounter (Addendum)
Scheduler called triage to take call, asked to send message to nurse in follow up  Pt c/o swelling: STAT is pt has developed SOB within 24 hours  1) How much weight have you gained and in what time span?  2) If swelling, where is the swelling located? Legs, ankles  3) Are you currently taking a fluid pill? yes  4) Are you currently SOB? Yes. Patient states she was at 81% then put her oxygen on.   5) Do you have a log of your daily weights (if so, list)? 206 today, 215 on Feb13  6) Have you gained 3 pounds in a day or 5 pounds in a week?   7) Have you traveled recently? no

## 2018-07-16 NOTE — Progress Notes (Signed)
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 Certain laxatives my be high in sodium and should be avoided unless prescribed   by a physician or pharmacist 5. _____ Salt substitutes may be used freely by anyone on a sodium restricted diet 6. _____ Sodium is present in table salt, food additives and as a natural component of   most foods 7. _____ Table salt is approximately 90% sodium 8. _____ Limiting sodium intake may help prevent excess fluid accumulation in the body 9. _____  On a sodium-restricted diet, seasonings such as bouillon soy sauce, and    cooking wine should be used in place of table salt 10. _____ On an ingredient list, a product which lists monosodium glutamate as the first   ingredient is an appropriate food to include on a low sodium diet  Circle the best answer(s) to the following statements (Hint: there may be more than one correct answer)  11. On a low-sodium diet, some acceptable snack items are:    A. Olives  F. Bean dip   K.  Grapefruit juice    B. Salted Pretzels G. Commercial Popcorn   L. Canned peaches    C. Carrot Sticks  H. Bouillon   M. Unsalted nuts   D. [place] fries  I. Peanut butter crackers N. Salami   E. Sweet pickles J. Tomato Juice   O. Pizza  12.  Seasonings that may be used freely on a reduced - sodium diet include   A. Lemon wedges F.Monosodium glutamate K. Celery seed    B.Soysauce   G. Pepper   L. Mustard powder   C. Sea salt  H. Cooking wine  M. Onion flakes   D. Vinegar  E. Prepared horseradish N. Salsa   E. Sage   J. Worcestershire sauce  O. [address]      Sumner Boast, PA-C  07/17/2018 12:05 PM    [facility] [place], [place], [place]  [address] Phone: [phone]; Fax: [phone]

## 2018-07-17 ENCOUNTER — Encounter (INDEPENDENT_AMBULATORY_CARE_PROVIDER_SITE_OTHER): Payer: Self-pay

## 2018-07-17 ENCOUNTER — Encounter: Payer: Self-pay | Admitting: Physician Assistant

## 2018-07-17 ENCOUNTER — Ambulatory Visit (INDEPENDENT_AMBULATORY_CARE_PROVIDER_SITE_OTHER): Payer: Medicare Other | Admitting: Physician Assistant

## 2018-07-17 VITALS — BP 120/50 | HR 69 | Ht 59.75 in | Wt 219.8 lb

## 2018-07-17 DIAGNOSIS — R0789 Other chest pain: Secondary | ICD-10-CM | POA: Diagnosis not present

## 2018-07-17 DIAGNOSIS — J9611 Chronic respiratory failure with hypoxia: Secondary | ICD-10-CM

## 2018-07-17 DIAGNOSIS — I10 Essential (primary) hypertension: Secondary | ICD-10-CM

## 2018-07-17 DIAGNOSIS — I5032 Chronic diastolic (congestive) heart failure: Secondary | ICD-10-CM

## 2018-07-17 DIAGNOSIS — E785 Hyperlipidemia, unspecified: Secondary | ICD-10-CM

## 2018-07-17 NOTE — Patient Instructions (Signed)
Medication Instructions:  Your physician recommends that you continue on your current medications as directed. Please refer to the Current Medication list given to you today.  If you need a refill on your cardiac medications before your next appointment, please call your pharmacy.   Lab work: None Ordered  If you have labs (blood work) drawn today and your tests are completely normal, you will receive your results only by: Marland Kitchen MyChart Message (if you have MyChart) OR . A paper copy in the mail If you have any lab test that is abnormal or we need to change your treatment, we will call you to review the results.  Testing/Procedures: None ordered  Follow-Up: At [facility], [place], you and your health needs are our priority.  As part of our continuing mission to provide you with exceptional heart care, we have created designated Provider Care Teams.  These Care Teams include your primary Cardiologist (physician) and Advanced Practice Providers (APPs -  Physician Assistants and Nurse Practitioners) who all work together to provide you with the care you need, when you need it. . You will need a follow up appointment in 4-6 months.  Please call our office 2 months in advance to schedule this appointment.  You may see Casandra Doffing, MD or one of the following Advanced Practice Providers on your designated Care Team:   . Lyda Jester, PA-C . Dayna Dunn, PA-C . Ermalinda Barrios, PA-C  Any Other Special Instructions Will Be Listed Below (If Applicable).  Wear your oxygen as prescribed  Two Gram Sodium Diet 2000 mg  What is Sodium? Sodium is a mineral found naturally in many foods. The most significant source of sodium in the diet is table salt, which is about 40% sodium.  Processed, convenience, and preserved foods also contain a large amount of sodium.  The body needs only 500 mg of sodium daily to function,  A normal diet provides more than enough sodium even if you do not use salt.  Why Limit  Sodium? A build up of sodium in the body can cause thirst, increased blood pressure, shortness of breath, and water retention.  Decreasing sodium in the diet can reduce edema and risk of heart attack or stroke associated with high blood pressure.  Keep in mind that there are many other factors involved in these health problems.  Heredity, obesity, lack of exercise, cigarette smoking, stress and what you eat all play a role.  General Guidelines:  Do not add salt at the table or in cooking.  One teaspoon of salt contains over 2 grams of sodium.  Read food labels  Avoid processed and convenience foods  Ask your dietitian before eating any foods not dicussed in the menu planning guidelines  Consult your physician if you wish to use a salt substitute or a sodium containing medication such as antacids.  Limit milk and milk products to 16 oz (2 cups) per day.  Shopping Hints:  READ LABELS!! "Dietetic" does not necessarily mean low sodium.  Salt and other sodium ingredients are often added to foods during processing.   Menu Planning Guidelines Food Group Choose More Often Avoid  Beverages (see also the milk group All fruit juices, low-sodium, salt-free vegetables juices, low-sodium carbonated beverages Regular vegetable or tomato juices, commercially softened water used for drinking or cooking  Breads and Cereals Enriched white, wheat, rye and pumpernickel bread, hard rolls and dinner rolls; muffins, cornbread and waffles; most dry cereals, cooked cereal without added salt; unsalted crackers and breadsticks; low  sodium or homemade bread crumbs Bread, rolls and crackers with salted tops; quick breads; instant hot cereals; pancakes; commercial bread stuffing; self-rising flower and biscuit mixes; regular bread crumbs or cracker crumbs  Desserts and Sweets Desserts and sweets mad with mild should be within allowance Instant pudding mixes and cake mixes  Fats Butter or margarine; vegetable oils;  unsalted salad dressings, regular salad dressings limited to 1 Tbs; light, sour and heavy cream Regular salad dressings containing bacon fat, bacon bits, and salt pork; snack dips made with instant soup mixes or processed cheese; salted nuts  Fruits Most fresh, frozen and canned fruits Fruits processed with salt or sodium-containing ingredient (some dried fruits are processed with sodium sulfites        Vegetables Fresh, frozen vegetables and low- sodium canned vegetables Regular canned vegetables, sauerkraut, pickled vegetables, and others prepared in brine; frozen vegetables in sauces; vegetables seasoned with ham, bacon or salt pork  Condiments, Sauces, Miscellaneous  Salt substitute with physician's approval; pepper, herbs, spices; vinegar, lemon or lime juice; hot pepper sauce; garlic powder, onion powder, low sodium soy sauce (1 Tbs.); low sodium condiments (ketchup, chili sauce, mustard) in limited amounts (1 tsp.) fresh ground horseradish; unsalted tortilla chips, pretzels, potato chips, popcorn, salsa (1/4 cup) Any seasoning made with salt including garlic salt, celery salt, onion salt, and seasoned salt; sea salt, rock salt, kosher salt; meat tenderizers; monosodium glutamate; mustard, regular soy sauce, barbecue, sauce, chili sauce, teriyaki sauce, steak sauce, Worcestershire sauce, and most flavored vinegars; canned gravy and mixes; regular condiments; salted snack foods, olives, picles, relish, horseradish sauce, catsup   Food preparation: Try these seasonings Meats:    Pork Sage, onion Serve with applesauce  Chicken Poultry seasoning, thyme, parsley Serve with cranberry sauce  Lamb Curry powder, rosemary, garlic, thyme Serve with mint sauce or jelly  Veal Marjoram, basil Serve with current jelly, cranberry sauce  Beef Pepper, bay leaf Serve with dry mustard, unsalted chive butter  Fish Bay leaf, dill Serve with unsalted lemon butter, unsalted parsley butter  Vegetables:     Asparagus Lemon juice   Broccoli Lemon juice   Carrots Mustard dressing parsley, mint, nutmeg, glazed with unsalted butter and sugar   Green beans Marjoram, lemon juice, nutmeg,dill seed   Tomatoes Basil, marjoram, onion   Spice /blend for [employer]" 4 tsp ground thyme 1 tsp ground sage 3 tsp ground rosemary 4 tsp ground marjoram   Test your knowledge 1. A product that says "Salt Free" may still contain sodium. True or False 2. Garlic Powder and Hot Pepper Sauce an be used as alternative seasonings.True or False 3. Processed foods have more sodium than fresh foods.  True or False 4. Canned Vegetables have less sodium than froze True or False  WAYS TO DECREASE YOUR SODIUM INTAKE 1. Avoid the use of added salt in cooking and at the table.  Table salt (and other prepared seasonings which contain salt) is probably one of the greatest sources of sodium in the diet.  Unsalted foods can gain flavor from the sweet, sour, and butter taste sensations of herbs and spices.  Instead of using salt for seasoning, try the following seasonings with the foods listed.  Remember: how you use them to enhance natural food flavors is limited only by your creativity... Allspice-Meat, fish, eggs, fruit, peas, red and yellow vegetables Almond Extract-Fruit baked goods Anise Seed-Sweet breads, fruit, carrots, beets, cottage cheese, cookies (tastes like licorice) Basil-Meat, fish, eggs, vegetables, rice, vegetables salads, soups, sauces Bay  Leaf-Meat, fish, stews, poultry Burnet-Salad, vegetables (cucumber-like flavor) Caraway Seed-Bread, cookies, cottage cheese, meat, vegetables, cheese, rice Cardamon-Baked goods, fruit, soups Celery Powder or seed-Salads, salad dressings, sauces, meatloaf, soup, bread.Do not use  celery salt Chervil-Meats, salads, fish, eggs, vegetables, cottage cheese (parsley-like flavor) Chili Power-Meatloaf, chicken cheese, corn, eggplant, egg dishes Chives-Salads cottage cheese, egg  dishes, soups, vegetables, sauces Cilantro-Salsa, casseroles Cinnamon-Baked goods, fruit, pork, lamb, chicken, carrots Cloves-Fruit, baked goods, fish, pot roast, green beans, beets, carrots Coriander-Pastry, cookies, meat, salads, cheese (lemon-orange flavor) Cumin-Meatloaf, fish,cheese, eggs, cabbage,fruit pie (caraway flavor) [employer], fruit, eggs, fish, poultry, cottage cheese, vegetables Dill Seed-Meat, cottage cheese, poultry, vegetables, fish, salads, bread Fennel Seed-Bread, cookies, apples, pork, eggs, fish, beets, cabbage, cheese, Licorice-like flavor Garlic-(buds or powder) Salads, meat, poultry, fish, bread, butter, vegetables, potatoes.Do not  use garlic salt Ginger-Fruit, vegetables, baked goods, meat, fish, poultry Horseradish Root-Meet, vegetables, butter Lemon Juice or Extract-Vegetables, fruit, tea, baked goods, fish salads Mace-Baked goods fruit, vegetables, fish, poultry (taste like nutmeg) Maple Extract-Syrups Marjoram-Meat, chicken, fish, vegetables, breads, green salads (taste like Sage) Mint-Tea, lamb, sherbet, vegetables, desserts, carrots, cabbage Mustard, Dry or Seed-Cheese, eggs, meats, vegetables, poultry Nutmeg-Baked goods, fruit, chicken, eggs, vegetables, desserts Onion Powder-Meat, fish, poultry, vegetables, cheese, eggs, bread, rice salads (Do not use   Onion salt) Orange Extract-Desserts, baked goods Oregano-Pasta, eggs, cheese, onions, pork, lamb, fish, chicken, vegetables, green salads Paprika-Meat, fish, poultry, eggs, cheese, vegetables Parsley Flakes-Butter, vegetables, meat fish, poultry, eggs, bread, salads (certain forms may   Contain sodium Pepper-Meat fish, poultry, vegetables, eggs Peppermint Extract-Desserts, baked goods Poppy Seed-Eggs, bread, cheese, fruit dressings, baked goods, noodles, vegetables, cottage  [employer], poultry, meat, fish, cauliflower, turnips,eggs bread Saffron-Rice,  bread, veal, chicken, fish, eggs Sage-Meat, fish, poultry, onions, eggplant, tomateos, pork, stews Savory-Eggs, salads, poultry, meat, rice, vegetables, soups, pork Tarragon-Meat, poultry, fish, eggs, butter, vegetables (licorice-like flavor)  Thyme-Meat, poultry, fish, eggs, vegetables, (clover-like flavor), sauces, soups Tumeric-Salads, butter, eggs, fish, rice, vegetables (saffron-like flavor) Vanilla Extract-Baked goods, candy Vinegar-Salads, vegetables, meat marinades Walnut Extract-baked goods, candy  2. Choose your Foods Wisely   The following is a list of foods to avoid which are high in sodium:  Meats-Avoid all smoked, canned, salt cured, dried and kosher meat and fish as well as Anchovies   [employer] meats:Bologna, Liverwurst, Pastrami Canned meat or fish  Marinated herring Caviar    Pepperoni Corned Beef   Pizza Dried chipped beef  Salami Frozen breaded fish or meat Salt pork Frankfurters or hot dogs  Sardines Gefilte fish   Sausage Ham (boiled ham, Proscuitto Smoked butt    spiced ham)   Spam      TV Dinners Vegetables Canned vegetables (Regular) Relish Canned mushrooms  Sauerkraut Olives    Tomato juice Pickles  Bakery and Dessert Products Canned puddings  Cream pies Cheesecake   Decorated cakes Cookies  Beverages/Juices Tomato juice, regular  Gatorade   V-8 vegetable juice, regular  Breads and Cereals Biscuit mixes   Salted potato chips, corn chips, pretzels Bread stuffing mixes  Salted crackers and rolls Pancake and waffle mixes Self-rising flour  Seasonings Accent    Meat sauces Barbecue sauce  Meat tenderizer Catsup    Monosodium glutamate (MSG) Celery salt   Onion salt Chili sauce   Prepared mustard Garlic salt   Salt, seasoned salt, sea salt Gravy mixes   Soy sauce Horseradish   Steak sauce Ketchup   Tartar sauce Lite salt    Teriyaki sauce Marinade  mixes   Worcestershire sauce  Others Baking powder   Cocoa and cocoa  mixes Baking soda   Commercial casserole mixes Candy-caramels, chocolate  Dehydrated soups    Bars, fudge,nougats  Instant rice and pasta mixes Canned broth or soup  Maraschino cherries Cheese, aged and processed cheese and cheese spreads  Learning Assessment Quiz  Indicated T (for True) or F (for False) for each of the following statements:  1. _____ Fresh fruits and vegetables and unprocessed grains are generally low in sodium 2. _____ Water may contain a considerable amount of sodium, depending on the source 3. _____ You can always tell if a food is high in sodium by tasting it 4. _____ Certain laxatives my be high in sodium and should be avoided unless prescribed   by a physician or pharmacist 5. _____ Salt substitutes may be used freely by anyone on a sodium restricted diet 6. _____ Sodium is present in table salt, food additives and as a natural component of   most foods 7. _____ Table salt is approximately 90% sodium 8. _____ Limiting sodium intake may help prevent excess fluid accumulation in the body 9. _____ On a sodium-restricted diet, seasonings such as bouillon soy sauce, and    cooking wine should be used in place of table salt 10. _____ On an ingredient list, a product which lists monosodium glutamate as the first   ingredient is an appropriate food to include on a low sodium diet  Circle the best answer(s) to the following statements (Hint: there may be more than one correct answer)  11. On a low-sodium diet, some acceptable snack items are:    A. Olives  F. Bean dip   K. Grapefruit juice    B. Salted Pretzels G. Commercial Popcorn   L. Canned peaches    C. Carrot Sticks  H. Bouillon   M. Unsalted nuts   D. [place] fries  I. Peanut butter crackers N. Salami   E. Sweet pickles J. Tomato Juice   O. Pizza  12.  Seasonings that may be used freely on a reduced - sodium diet include   A. Lemon wedges F.Monosodium glutamate K. Celery seed    B.Soysauce   G. Pepper   L.  Mustard powder   C. Sea salt  H. Cooking wine  M. Onion flakes   D. Vinegar  E. Prepared horseradish N. Salsa   E. Sage   J. Worcestershire sauce  O. Chutney

## 2018-07-20 ENCOUNTER — Ambulatory Visit: Payer: Medicare Other | Admitting: Podiatry

## 2018-07-24 ENCOUNTER — Ambulatory Visit: Payer: Medicare Other | Admitting: Physician Assistant

## 2018-07-27 ENCOUNTER — Other Ambulatory Visit: Payer: Self-pay | Admitting: Pulmonary Disease

## 2018-07-30 NOTE — Progress Notes (Deleted)
@Patient  ID: Amanda Cain, female    DOB: [birth year], 73 y.o.   MRN: [id number]  No chief complaint on file.   Referring provider: Mayra Neer, MD  HPI:  73 year old female never smoker followed in our office for Asthma  PMH: hypertension, coronary artery disease, type 2 diabetes, chronic kidney disease, Smoker/ Smoking History: Never Smoker  Maintenance: Breo 200 Pt of: Dr. Vaughan Browner  Recent [facility] Encounters:     07/30/2018  - Visit   HPI  Tests:   Imaging  Chest x-ray 11/17/16-CHF with interstitial edema CT neck 02/19/16-lung apices appear clear CT abdomen 11/19/11-lung bases appear clear. Chest x-ray 12/12/17- bibasilar atelectasis, basilar opacities.   Chest x-ray 03/31/2018- cardiomegaly, mild vascular congestion. 05/14/2018-VQ scan-pulmonary embolism absent, very low probability of PE 05/13/2018-chest x-ray-cardiomegaly without acute disease   PFTs 04/05/17-unable to complete FENO 02/27/17- unable to complete FENO 08/23/17-unable to complete  Cardiac Echo 11/17/16 LVEF 60 to 40%, grade 2 diastolic dysfunction, high ventricular filling pressures. - Left ventricle: The cavity size was normal. There was moderate  Echo 11/14/17- Mild LVH, LVEF 98-11%, grade 2 diastolic dysfunction  Right heart catheterization 12/15/2017 Moderately to severely elevated left and right heart filling pressures and pulmonary hypertension. Normal Fick cardiac output/index.  RA (mean): 20 mmHg RV (S/EDP): 68/20 mmHg PA (S/D, mean): 66/30 (42) mmHg PCWP (mean): 30 mmHg  Ao sat: 99% (vial pulseoximeter) PA sat: 63%  Fick CO: 5.4 L/min Fick CI: 2.7 L/min/m^2  Labs:  05/13/2018-influenza panel-negative for A and B 05/13/2018-respiratory virus panel-none detected 91/47/8295-[id number] metabolic panel- sodium 865, potassium 5.5 (potential hemolysis), CH-91, CO2 29, glucose 108, BUN 37, creatinine 2.25, GFR 24 78/46/9629-[id number] metabolic panel- sodium 324, potassium 4.3, chloride  91, CO2 27, BUN 69, creatinine 2.52, GFR 21   05/12/2018-hospitalization-hypoxia/hyperkalemia Discharge date: 05/15/2018 Plan: Follow-up with PCP in 1 to 2 weeks, follow-up pulmonary 1 to 2 weeks, BMP and CBC in 1 week, questionable obstructive sleep apnea, d/c on 2L 02   FENO:  No results found for: NITRICOXIDE  PFT: No flowsheet data found.  Imaging: No results found.    Specialty Problems      Pulmonary Problems   Acute respiratory failure with hypoxia (HCC)   Influenza A   Flu   CAP (community acquired pneumonia)   Community acquired pneumonia   Chronic respiratory failure with hypoxia (HCC)   Asthma      Allergies  Allergen Reactions  . Aspirin Other (See Comments)    " Have an ulcer"    Immunization History  Administered Date(s) Administered  . Influenza Split 02/28/2016  . Influenza, High Dose Seasonal PF 03/12/2018  . Influenza,inj,Quad PF,6+ Mos 03/03/2015, 02/27/2017  . Pneumococcal Conjugate-13 02/28/2016    Past Medical History:  Diagnosis Date  . Anemia   . Anxiety    severe  . Arthritis   . Bronchitis    hx of  . CHF (congestive heart failure) ([place])   . Chronic kidney disease    "kidney disease stage 3"  . Depression   . Diabetes mellitus   . GERD (gastroesophageal reflux disease)   . Hx of gallstones   . Hypercholesteremia   . Hypertension   . Hypothyroidism     Tobacco History: Social History   Tobacco Use  Smoking Status Never Smoker  Smokeless Tobacco Never Used   Counseling given: Not Answered   Outpatient Encounter Medications as of 07/31/2018  Medication Sig  . acetaminophen (TYLENOL) 500 MG tablet Take 500 mg by mouth  daily as needed for mild pain.   Marland Kitchen albuterol (PROVENTIL HFA;VENTOLIN HFA) 108 (90 Base) MCG/ACT inhaler Inhale 2 puffs into the lungs every 4 (four) hours as needed for wheezing or shortness of breath.  Marland Kitchen atorvastatin (LIPITOR) 40 MG tablet Take 40 mg by mouth daily.   . Biotin 10000 MCG TABS Take 1  tablet by mouth daily.  Marland Kitchen BREO ELLIPTA 200-25 MCG/INH AEPB INHALE 1 PUFF INTO THE LUNG DAILY  . cholecalciferol (VITAMIN D) 1000 units tablet Take 1,000 Units by mouth daily.  Marland Kitchen diltiazem (CARDIZEM CD) 360 MG 24 hr capsule Take 1 capsule by mouth daily.  Marland Kitchen escitalopram (LEXAPRO) 20 MG tablet Take 20 mg by mouth at bedtime.  Marland Kitchen estradiol (ESTRACE) 0.5 MG tablet Take 0.5 mg by mouth every morning.   . ezetimibe (ZETIA) 10 MG tablet Take 10 mg by mouth daily.  . ferrous sulfate 325 (65 FE) MG tablet Take 325 mg by mouth 2 (two) times daily with a meal.   . fluticasone (FLONASE) 50 MCG/ACT nasal spray Place 1 spray into both nostrils daily as needed for allergies.   Marland Kitchen glimepiride (AMARYL) 1 MG tablet Take 1 mg by mouth daily with breakfast.   . hydrALAZINE (APRESOLINE) 25 MG tablet Take 1 tablet (25 mg total) by mouth every 8 (eight) hours.  Marland Kitchen levothyroxine (SYNTHROID, LEVOTHROID) 125 MCG tablet Take 250 mcg by mouth daily before breakfast.   . LORazepam (ATIVAN) 0.5 MG tablet Take 0.5-1 mg by mouth See admin instructions. Take one tablet in the morning and two tablets at bedtime.  . metoprolol succinate (TOPROL-XL) 100 MG 24 hr tablet Take 100 mg by mouth every morning.   . montelukast (SINGULAIR) 10 MG tablet take 1 tablet by mouth at bedtime  . Multiple Vitamin (MULITIVITAMIN WITH MINERALS) TABS Take 1 tablet by mouth daily.  . ondansetron (ZOFRAN-ODT) 4 MG disintegrating tablet Take 4 mg by mouth every 8 (eight) hours as needed for nausea or vomiting.  . paliperidone (INVEGA) 3 MG 24 hr tablet Take 3 mg by mouth at bedtime.  . pantoprazole (PROTONIX) 40 MG tablet TAKE 1 TABLET(40 MG) BY MOUTH DAILY  . Polyethyl Glycol-Propyl Glycol (SYSTANE) 0.4-0.3 % SOLN Apply 1 drop to eye daily as needed (for dry eyes).  . potassium chloride SA (K-DUR,KLOR-CON) 20 MEQ tablet Take 1 tablet (20 mEq total) by mouth 2 (two) times daily.  . Promethazine-Codeine 6.25-10 MG/5ML SOLN Take 5 mLs by mouth at bedtime as  needed (cough/cold).   . saccharomyces boulardii (FLORASTOR) 250 MG capsule Take 1 capsule (250 mg total) by mouth 2 (two) times daily.  . sitaGLIPtin (JANUVIA) 50 MG tablet Take 50 mg by mouth daily.  . sucralfate (CARAFATE) 1 G tablet Take 1 g by mouth 2 (two) times daily.  Marland Kitchen torsemide (DEMADEX) 100 MG tablet Take 100 mg by mouth daily.  . vitamin B-12 (CYANOCOBALAMIN) 1000 MCG tablet Take 1,000 mcg by mouth daily.  . vitamin C (ASCORBIC ACID) 500 MG tablet Take 500 mg by mouth daily.   No facility-administered encounter medications on file as of 07/31/2018.      Review of Systems  Review of Systems   Physical Exam  There were no vitals taken for this visit.  Wt Readings from Last 5 Encounters:  07/17/18 219 lb 12.8 oz (99.7 kg)  06/19/18 222 lb 12.8 oz (101.1 kg)  05/15/18 223 lb 12.3 oz (101.5 kg)  04/11/18 225 lb (102.1 kg)  04/04/18 223 lb 6.4 oz (101.3 kg)  Physical Exam    Lab Results:  CBC    Component Value Date/Time   WBC 9.8 06/07/2018 0803   RBC 3.38 (L) 06/07/2018 0803   HGB 9.2 (L) 06/07/2018 0803   HCT 31.0 (L) 06/07/2018 0803   PLT 293 06/07/2018 0803   MCV 91.7 06/07/2018 0803   MCH 27.2 06/07/2018 0803   MCHC 29.7 (L) 06/07/2018 0803   RDW 15.5 06/07/2018 0803   LYMPHSABS 1.2 12/12/2017 1813   MONOABS 0.6 12/12/2017 1813   EOSABS 0.1 12/12/2017 1813   BASOSABS 0.0 12/12/2017 1813    BMET    Component Value Date/Time   NA 133 (L) 06/07/2018 0803   NA 132 (L) 01/03/2018 0955   K 4.2 06/07/2018 0803   CL 97 (L) 06/07/2018 0803   CO2 27 06/07/2018 0803   GLUCOSE 103 (H) 06/07/2018 0803   BUN 30 (H) 06/07/2018 0803   BUN 34 (H) 01/03/2018 0955   CREATININE 1.80 (H) 06/07/2018 0803   CALCIUM 9.0 06/07/2018 0803   GFRNONAA 27 (L) 06/07/2018 0803   GFRAA 32 (L) 06/07/2018 0803    BNP    Component Value Date/Time   BNP 172.1 (H) 05/12/2018 1457    ProBNP    Component Value Date/Time   PROBNP 557 (H) 11/14/2017 0812   PROBNP  130.6 (H) 11/19/2011 2155      Assessment & Plan:     No problem-specific Assessment & Plan notes found for this encounter.     Lauraine Rinne, NP 07/30/2018   This appointment was *** with over 50% of the time in direct face-to-face patient care, assessment, plan of care, and follow-up.

## 2018-07-31 ENCOUNTER — Ambulatory Visit: Payer: Medicare Other | Admitting: Pulmonary Disease

## 2018-08-02 ENCOUNTER — Telehealth: Payer: Self-pay | Admitting: Pulmonary Disease

## 2018-08-02 DIAGNOSIS — J4540 Moderate persistent asthma, uncomplicated: Secondary | ICD-10-CM

## 2018-08-02 NOTE — Telephone Encounter (Signed)
Called and spoke with Patient. Patient stated that she received a call from [facility], stating that they need a letter faxed.  Patient stated that letter needed to stated that Patient needs oxygen, but is fine without oxygen, while sitting.  Patient stated that she is getting a POC.  Patient stated that she had been on waiting list since 04/2018 for POC.  [facility] [place], [phone], but it is after 5pm.   Will leave message open until information from [facility].

## 2018-08-03 NOTE — Telephone Encounter (Signed)
Called Jara at [facility], was placed on hold for over 5 minutes will call back.

## 2018-08-06 ENCOUNTER — Ambulatory Visit: Payer: Medicare Other | Admitting: Pulmonary Disease

## 2018-08-06 NOTE — Telephone Encounter (Signed)
Ok to send order. 

## 2018-08-06 NOTE — Progress Notes (Deleted)
@Patient  ID: Amanda Cain, female    DOB: 11-17-1945, 73 y.o.   MRN: [id number]  No chief complaint on file.   Referring provider: Mayra Neer, MD  HPI:  73 year old female never smoker followed in our office for Asthma  PMH: hypertension, coronary artery disease, type 2 diabetes, chronic kidney disease, Smoker/ Smoking History: Never Smoker  Maintenance: Breo 200 Pt of: Dr. Vaughan Browner  08/06/2018  - Visit   HPI  Tests:   Imaging  Chest x-ray 11/17/16-CHF with interstitial edema CT neck 02/19/16-lung apices appear clear CT abdomen 11/19/11-lung bases appear clear. Chest x-ray 12/12/17- bibasilar atelectasis, basilar opacities.   Chest x-ray 03/31/2018- cardiomegaly, mild vascular congestion. 05/14/2018-VQ scan-pulmonary embolism absent, very low probability of PE 05/13/2018-chest x-ray-cardiomegaly without acute disease   PFTs 04/05/17-unable to complete FENO 02/27/17- unable to complete FENO 08/23/17-unable to complete  Cardiac Echo 11/17/16 LVEF 60 to 76%, grade 2 diastolic dysfunction, high ventricular filling pressures. - Left ventricle: The cavity size was normal. There was moderate  Echo 11/14/17- Mild LVH, LVEF 16-07%, grade 2 diastolic dysfunction  Right heart catheterization 12/15/2017 Moderately to severely elevated left and right heart filling pressures and pulmonary hypertension. Normal Fick cardiac output/index.  RA (mean): 20 mmHg RV (S/EDP): 68/20 mmHg PA (S/D, mean): 66/30 (42) mmHg PCWP (mean): 30 mmHg  Ao sat: 99% (vial pulseoximeter) PA sat: 63%  Fick CO: 5.4 L/min Fick CI: 2.7 L/min/m^2  Labs:  05/13/2018-influenza panel-negative for A and B 05/13/2018-respiratory virus panel-none detected 37/02/6268-SWNIO metabolic panel- sodium 270, potassium 5.5 (potential hemolysis), CH-91, CO2 29, glucose 108, BUN 37, creatinine 2.25, GFR 24 35/00/9381-[facility]EX[facility] metabolic panel- sodium 716, potassium 4.3, chloride 91, CO2 27, BUN 69, creatinine 2.52, GFR  21   05/12/2018-hospitalization-hypoxia/hyperkalemia Discharge date: 05/15/2018 Plan: Follow-up with PCP in 1 to 2 weeks, follow-up pulmonary 1 to 2 weeks, BMP and CBC in 1 week, questionable obstructive sleep apnea, d/c on 2L 02  FENO:  No results found for: NITRICOXIDE  PFT: No flowsheet data found.  Imaging: No results found.    Specialty Problems      Pulmonary Problems   Acute respiratory failure with hypoxia (HCC)   Influenza A   Flu   CAP (community acquired pneumonia)   Community acquired pneumonia   Chronic respiratory failure with hypoxia (HCC)   Asthma      Allergies  Allergen Reactions  . Aspirin Other (See Comments)    " Have an ulcer"    Immunization History  Administered Date(s) Administered  . Influenza Split 02/28/2016  . Influenza, High Dose Seasonal PF 03/12/2018  . Influenza,inj,Quad PF,6+ Mos 03/03/2015, 02/27/2017  . Pneumococcal Conjugate-13 02/28/2016    Past Medical History:  Diagnosis Date  . Anemia   . Anxiety    severe  . Arthritis   . Bronchitis    hx of  . CHF (congestive heart failure) ([place])   . Chronic kidney disease    "kidney disease stage 3"  . Depression   . Diabetes mellitus   . GERD (gastroesophageal reflux disease)   . Hx of gallstones   . Hypercholesteremia   . Hypertension   . Hypothyroidism     Tobacco History: Social History   Tobacco Use  Smoking Status Never Smoker  Smokeless Tobacco Never Used   Counseling given: Not Answered   Outpatient Encounter Medications as of 08/06/2018  Medication Sig  . acetaminophen (TYLENOL) 500 MG tablet Take 500 mg by mouth daily as needed for mild pain.   Marland Kitchen  albuterol (PROVENTIL HFA;VENTOLIN HFA) 108 (90 Base) MCG/ACT inhaler Inhale 2 puffs into the lungs every 4 (four) hours as needed for wheezing or shortness of breath.  Marland Kitchen atorvastatin (LIPITOR) 40 MG tablet Take 40 mg by mouth daily.   . Biotin 10000 MCG TABS Take 1 tablet by mouth daily.  Marland Kitchen BREO ELLIPTA 200-25  MCG/INH AEPB INHALE 1 PUFF INTO THE LUNG DAILY  . cholecalciferol (VITAMIN D) 1000 units tablet Take 1,000 Units by mouth daily.  Marland Kitchen diltiazem (CARDIZEM CD) 360 MG 24 hr capsule Take 1 capsule by mouth daily.  Marland Kitchen escitalopram (LEXAPRO) 20 MG tablet Take 20 mg by mouth at bedtime.  Marland Kitchen estradiol (ESTRACE) 0.5 MG tablet Take 0.5 mg by mouth every morning.   . ezetimibe (ZETIA) 10 MG tablet Take 10 mg by mouth daily.  . ferrous sulfate 325 (65 FE) MG tablet Take 325 mg by mouth 2 (two) times daily with a meal.   . fluticasone (FLONASE) 50 MCG/ACT nasal spray Place 1 spray into both nostrils daily as needed for allergies.   Marland Kitchen glimepiride (AMARYL) 1 MG tablet Take 1 mg by mouth daily with breakfast.   . hydrALAZINE (APRESOLINE) 25 MG tablet Take 1 tablet (25 mg total) by mouth every 8 (eight) hours.  Marland Kitchen levothyroxine (SYNTHROID, LEVOTHROID) 125 MCG tablet Take 250 mcg by mouth daily before breakfast.   . LORazepam (ATIVAN) 0.5 MG tablet Take 0.5-1 mg by mouth See admin instructions. Take one tablet in the morning and two tablets at bedtime.  . metoprolol succinate (TOPROL-XL) 100 MG 24 hr tablet Take 100 mg by mouth every morning.   . montelukast (SINGULAIR) 10 MG tablet take 1 tablet by mouth at bedtime  . Multiple Vitamin (MULITIVITAMIN WITH MINERALS) TABS Take 1 tablet by mouth daily.  . ondansetron (ZOFRAN-ODT) 4 MG disintegrating tablet Take 4 mg by mouth every 8 (eight) hours as needed for nausea or vomiting.  . paliperidone (INVEGA) 3 MG 24 hr tablet Take 3 mg by mouth at bedtime.  . pantoprazole (PROTONIX) 40 MG tablet TAKE 1 TABLET(40 MG) BY MOUTH DAILY  . Polyethyl Glycol-Propyl Glycol (SYSTANE) 0.4-0.3 % SOLN Apply 1 drop to eye daily as needed (for dry eyes).  . potassium chloride SA (K-DUR,KLOR-CON) 20 MEQ tablet Take 1 tablet (20 mEq total) by mouth 2 (two) times daily.  . Promethazine-Codeine 6.25-10 MG/5ML SOLN Take 5 mLs by mouth at bedtime as needed (cough/cold).   . saccharomyces  boulardii (FLORASTOR) 250 MG capsule Take 1 capsule (250 mg total) by mouth 2 (two) times daily.  . sitaGLIPtin (JANUVIA) 50 MG tablet Take 50 mg by mouth daily.  . sucralfate (CARAFATE) 1 G tablet Take 1 g by mouth 2 (two) times daily.  Marland Kitchen torsemide (DEMADEX) 100 MG tablet Take 100 mg by mouth daily.  . vitamin B-12 (CYANOCOBALAMIN) 1000 MCG tablet Take 1,000 mcg by mouth daily.  . vitamin C (ASCORBIC ACID) 500 MG tablet Take 500 mg by mouth daily.   No facility-administered encounter medications on file as of 08/06/2018.      Review of Systems  Review of Systems   Physical Exam  There were no vitals taken for this visit.  Wt Readings from Last 5 Encounters:  07/17/18 219 lb 12.8 oz (99.7 kg)  06/19/18 222 lb 12.8 oz (101.1 kg)  05/15/18 223 lb 12.3 oz (101.5 kg)  04/11/18 225 lb (102.1 kg)  04/04/18 223 lb 6.4 oz (101.3 kg)     Physical Exam    Lab  Results:  CBC    Component Value Date/Time   WBC 9.8 06/07/2018 0803   RBC 3.38 (L) 06/07/2018 0803   HGB 9.2 (L) 06/07/2018 0803   HCT 31.0 (L) 06/07/2018 0803   PLT 293 06/07/2018 0803   MCV 91.7 06/07/2018 0803   MCH 27.2 06/07/2018 0803   MCHC 29.7 (L) 06/07/2018 0803   RDW 15.5 06/07/2018 0803   LYMPHSABS 1.2 12/12/2017 1813   MONOABS 0.6 12/12/2017 1813   EOSABS 0.1 12/12/2017 1813   BASOSABS 0.0 12/12/2017 1813    BMET    Component Value Date/Time   NA 133 (L) 06/07/2018 0803   NA 132 (L) 01/03/2018 0955   K 4.2 06/07/2018 0803   CL 97 (L) 06/07/2018 0803   CO2 27 06/07/2018 0803   GLUCOSE 103 (H) 06/07/2018 0803   BUN 30 (H) 06/07/2018 0803   BUN 34 (H) 01/03/2018 0955   CREATININE 1.80 (H) 06/07/2018 0803   CALCIUM 9.0 06/07/2018 0803   GFRNONAA 27 (L) 06/07/2018 0803   GFRAA 32 (L) 06/07/2018 0803    BNP    Component Value Date/Time   BNP 172.1 (H) 05/12/2018 1457    ProBNP    Component Value Date/Time   PROBNP 557 (H) 11/14/2017 0812   PROBNP 130.6 (H) 11/19/2011 2155       Assessment & Plan:     No problem-specific Assessment & Plan notes found for this encounter.     Lauraine Rinne, NP 08/06/2018   This appointment was *** with over 50% of the time in direct face-to-face patient care, assessment, plan of care, and follow-up.

## 2018-08-06 NOTE — Telephone Encounter (Signed)
Spoke with a CSR from [employer] and he stated that they needed an order for evaluation for smaller tanks per pt request. Dr. Vaughan Browner is it ok to send order. Please advise.

## 2018-08-08 ENCOUNTER — Ambulatory Visit: Payer: Medicare Other | Admitting: Interventional Cardiology

## 2018-08-08 NOTE — Progress Notes (Signed)
@Patient  ID: Amanda Cain, female    DOB: September 24, 1945, 73 y.o.   MRN: [id number]  Chief Complaint  Patient presents with   Acute Visit    Cough, shortness of breath    Referring provider: Mayra Neer, MD  HPI:  74 year old female never smoker followed in our office for Asthma  PMH: hypertension, coronary artery disease, type 2 diabetes, chronic kidney disease Smoker/ Smoking History: Never Smoker  Maintenance: Breo 200 Pt of: Dr. Vaughan Browner  08/09/2018  - Visit   73 year old female never smoker presenting to our office today for an acute visit.  Patient reporting that for the last 3 to 4 weeks she is had increased cough.  She reports she was treated over the telephone by Dr. Brigitte Pulse and was given narcotic cough syrup which help with the cough.  Patient presenting today for further evaluation as she has had increased fatigue as well as occasional bouts of shortness of breath.  Patient continues to lead a exceptionally sedentary lifestyle.  Her sister who presents with her today reports that the patient does not "go outside for anything except doctors appointments".  The patient has not increased physical activity as discussed at her last office visit.  Per chart review the patient did complete follow-up with cardiology who per chart review reveals the patient was sent to the hospital for IV diuresis.  Patient's weight is still elevated today.  Patient reports that she has been taking additional fluid pills under her own discretion.  She reports that in the past when she had been seen by Dr. Florene Glen with nephrology he said this was okay.  Patient also presented to our office today off her oxygen.  When patient was ambulated to exam room to initially be worked up patient was satting 84% on room air.  At January/2020 office visit the patient was walked and it was explained to the patient she needs 2 L via nasal cannula with exertion at all times.  The patient does not have oxygen with her today.   [facility] DME [facility].   Tests:   Imaging  Chest x-ray 11/17/16-CHF with interstitial edema CT neck 02/19/16-lung apices appear clear CT abdomen 11/19/11-lung bases appear clear. Chest x-ray 12/12/17- bibasilar atelectasis, basilar opacities.   Chest x-ray 03/31/2018- cardiomegaly, mild vascular congestion. 05/14/2018-VQ scan-pulmonary embolism absent, very low probability of PE 05/13/2018-chest x-ray-cardiomegaly without acute disease   PFTs 04/05/17-unable to complete FENO 02/27/17- unable to complete FENO 08/23/17-unable to complete  Cardiac Echo 11/17/16 LVEF 60 to 10%, grade 2 diastolic dysfunction, high ventricular filling pressures. - Left ventricle: The cavity size was normal. There was moderate  Echo 11/14/17- Mild LVH, LVEF 17-51%, grade 2 diastolic dysfunction  Right heart catheterization 12/15/2017 Moderately to severely elevated left and right heart filling pressures and pulmonary hypertension. Normal Fick cardiac output/index.  RA (mean): 20 mmHg RV (S/EDP): 68/20 mmHg PA (S/D, mean): 66/30 (42) mmHg PCWP (mean): 30 mmHg  Ao sat: 99% (vial pulseoximeter) PA sat: 63%  Fick CO: 5.4 L/min Fick CI: 2.7 L/min/m^2  Labs:  05/13/2018-influenza panel-negative for A and B 05/13/2018-respiratory virus panel-none detected 02/58/5277-[facility]MP metabolic panel- sodium 536, potassium 5.5 (potential hemolysis), CH-91, CO2 29, glucose 108, BUN 37, creatinine 2.25, GFR 24 14/43/1540-GQQ[facility] metabolic panel- sodium 195, potassium 4.3, chloride 91, CO2 27, BUN 69, creatinine 2.52, GFR 21   05/12/2018-hospitalization-hypoxia/hyperkalemia Discharge date: 05/15/2018 Plan: Follow-up with PCP in 1 to 2 weeks, follow-up pulmonary 1 to 2 weeks, BMP and CBC in 1  week, questionable obstructive sleep apnea, d/c on 2L 02  FENO:  No results found for: NITRICOXIDE  PFT: No flowsheet data found.  Imaging: Dg Chest 2 View  Result Date: 08/09/2018 CLINICAL DATA:  Cough EXAM: CHEST - 2  VIEW COMPARISON:  June 07, 2018 FINDINGS: There is scarring in the left mid lung. There is no appreciable edema or consolidation. Heart is mildly enlarged with pulmonary vascularity normal. No adenopathy. There is degenerative change in the thoracic spine. IMPRESSION: Stable cardiac prominence. Scarring left mid lung. No edema or consolidation. Electronically Signed   By: Lowella Grip III M.D.   On: 08/09/2018 14:14      Specialty Problems      Pulmonary Problems   Acute respiratory failure with hypoxia (HCC)   Influenza A   Flu   CAP (community acquired pneumonia)   Community acquired pneumonia   Chronic respiratory failure with hypoxia ([place])   Asthma   Shortness of breath      Allergies  Allergen Reactions   Aspirin Other (See Comments)    " Have an ulcer"    Immunization History  Administered Date(s) Administered   Influenza Split 02/28/2016   Influenza, High Dose Seasonal PF 03/12/2018   Influenza,inj,Quad PF,6+ Mos 03/03/2015, 02/27/2017   Pneumococcal Conjugate-13 02/28/2016    Past Medical History:  Diagnosis Date   Anemia    Anxiety    severe   Arthritis    Bronchitis    hx of   CHF (congestive heart failure) (HCC)    Chronic kidney disease    "kidney disease stage 3"   Depression    Diabetes mellitus    GERD (gastroesophageal reflux disease)    Hx of gallstones    Hypercholesteremia    Hypertension    Hypothyroidism     Tobacco History: Social History   Tobacco Use  Smoking Status Never Smoker  Smokeless Tobacco Never Used   Counseling given: Yes   Outpatient Encounter Medications as of 08/09/2018  Medication Sig   acetaminophen (TYLENOL) 500 MG tablet Take 500 mg by mouth daily as needed for mild pain.    albuterol (PROVENTIL HFA;VENTOLIN HFA) 108 (90 Base) MCG/ACT inhaler Inhale 2 puffs into the lungs every 4 (four) hours as needed for wheezing or shortness of breath.   atorvastatin (LIPITOR) 40 MG tablet Take 40  mg by mouth daily.    Biotin 10000 MCG TABS Take 1 tablet by mouth daily.   BREO ELLIPTA 200-25 MCG/INH AEPB INHALE 1 PUFF INTO THE LUNG DAILY   cholecalciferol (VITAMIN D) 1000 units tablet Take 1,000 Units by mouth daily.   diltiazem (CARDIZEM CD) 360 MG 24 hr capsule Take 1 capsule by mouth daily.   escitalopram (LEXAPRO) 20 MG tablet Take 20 mg by mouth at bedtime.   estradiol (ESTRACE) 0.5 MG tablet Take 0.5 mg by mouth every morning.    ezetimibe (ZETIA) 10 MG tablet Take 10 mg by mouth daily.   ferrous sulfate 325 (65 FE) MG tablet Take 325 mg by mouth 2 (two) times daily with a meal.    fluticasone (FLONASE) 50 MCG/ACT nasal spray Place 1 spray into both nostrils daily as needed for allergies.    glimepiride (AMARYL) 1 MG tablet Take 1 mg by mouth daily with breakfast.    levothyroxine (SYNTHROID, LEVOTHROID) 125 MCG tablet Take 250 mcg by mouth daily before breakfast.    LORazepam (ATIVAN) 0.5 MG tablet Take 0.5-1 mg by mouth See admin instructions. Take one tablet in the  morning and two tablets at bedtime.   metoprolol succinate (TOPROL-XL) 100 MG 24 hr tablet Take 100 mg by mouth every morning.    montelukast (SINGULAIR) 10 MG tablet take 1 tablet by mouth at bedtime   Multiple Vitamin (MULITIVITAMIN WITH MINERALS) TABS Take 1 tablet by mouth daily.   ondansetron (ZOFRAN-ODT) 4 MG disintegrating tablet Take 4 mg by mouth every 8 (eight) hours as needed for nausea or vomiting.   paliperidone (INVEGA) 3 MG 24 hr tablet Take 3 mg by mouth at bedtime.   pantoprazole (PROTONIX) 40 MG tablet TAKE 1 TABLET(40 MG) BY MOUTH DAILY   Polyethyl Glycol-Propyl Glycol (SYSTANE) 0.4-0.3 % SOLN Apply 1 drop to eye daily as needed (for dry eyes).   potassium chloride SA (K-DUR,KLOR-CON) 20 MEQ tablet Take 1 tablet (20 mEq total) by mouth 2 (two) times daily.   Promethazine-Codeine 6.25-10 MG/5ML SOLN Take 5 mLs by mouth at bedtime as needed (cough/cold).    saccharomyces  boulardii (FLORASTOR) 250 MG capsule Take 1 capsule (250 mg total) by mouth 2 (two) times daily.   sitaGLIPtin (JANUVIA) 50 MG tablet Take 50 mg by mouth daily.   sucralfate (CARAFATE) 1 G tablet Take 1 g by mouth 2 (two) times daily.   torsemide (DEMADEX) 100 MG tablet Take 100 mg by mouth daily.   vitamin B-12 (CYANOCOBALAMIN) 1000 MCG tablet Take 1,000 mcg by mouth daily.   vitamin C (ASCORBIC ACID) 500 MG tablet Take 500 mg by mouth daily.   hydrALAZINE (APRESOLINE) 25 MG tablet Take 1 tablet (25 mg total) by mouth every 8 (eight) hours.   No facility-administered encounter medications on file as of 08/09/2018.      Review of Systems  Review of Systems  Constitutional: Positive for fatigue. Negative for fever.  HENT: Negative for congestion, postnasal drip and rhinorrhea.   Respiratory: Positive for shortness of breath. Negative for cough and stridor.   Cardiovascular: Positive for leg swelling. Negative for chest pain and palpitations.  Gastrointestinal: Negative for diarrhea, nausea and vomiting.  Musculoskeletal: Negative for arthralgias.  Psychiatric/Behavioral: Positive for dysphoric mood.     Physical Exam  BP (!) 142/80 (BP Location: Left Arm, Cuff Size: Large)    Pulse 74    Temp 98.5 F (36.9 C) (Oral)    Ht 4\' 11"  (1.499 m)    Wt 219 lb 4.8 oz (99.5 kg)    SpO2 96%    BMI 44.29 kg/m   Wt Readings from Last 5 Encounters:  08/09/18 219 lb 4.8 oz (99.5 kg)  07/17/18 219 lb 12.8 oz (99.7 kg)  06/19/18 222 lb 12.8 oz (101.1 kg)  05/15/18 223 lb 12.3 oz (101.5 kg)  04/11/18 225 lb (102.1 kg)     Physical Exam  Constitutional: She is oriented to person, place, and time and well-developed, well-nourished, and in no distress. No distress.  Chronically ill elderly female  HENT:  Head: Normocephalic and atraumatic.  Right Ear: Hearing, tympanic membrane, external ear and ear canal normal.  Left Ear: Hearing, tympanic membrane, external ear and ear canal normal.    Nose: Nose normal. Right sinus exhibits no maxillary sinus tenderness and no frontal sinus tenderness. Left sinus exhibits no maxillary sinus tenderness and no frontal sinus tenderness.  Mouth/Throat: Uvula is midline and oropharynx is clear and moist. No oropharyngeal exudate.  Eyes: Pupils are equal, round, and reactive to light.  Neck: Normal range of motion. Neck supple.  Cardiovascular: Normal rate, regular rhythm and normal heart sounds.  Pulmonary/Chest:  Effort normal and breath sounds normal. No accessory muscle usage. No respiratory distress. She has no decreased breath sounds. She has no wheezes. She has no rhonchi.  Abdominal: Soft. Bowel sounds are normal. She exhibits no distension. There is no abdominal tenderness.  Obese abdomen  Musculoskeletal: Normal range of motion.        General: Edema (2+ lower extremity edema bilaterally, probable abdominal edema) present.  Lymphadenopathy:    She has no cervical adenopathy.  Neurological: She is alert and oriented to person, place, and time.  Shuffling gait  Skin: Skin is warm and dry. She is not diaphoretic. No erythema.  Psychiatric: Mood, memory, affect and judgment normal.  Nursing note and vitals reviewed.     Lab Results:  CBC    Component Value Date/Time   WBC 9.8 08/09/2018 1157   RBC 3.10 (L) 08/09/2018 1157   HGB 8.6 Repeated and verified X2. (L) 08/09/2018 1157   HCT 26.2 Repeated and verified X2. (L) 08/09/2018 1157   PLT 316.0 08/09/2018 1157   MCV 84.4 08/09/2018 1157   MCH 27.2 06/07/2018 0803   MCHC 32.9 08/09/2018 1157   RDW 16.8 (H) 08/09/2018 1157   LYMPHSABS 0.9 08/09/2018 1157   MONOABS 0.9 08/09/2018 1157   EOSABS 0.1 08/09/2018 1157   BASOSABS 0.0 08/09/2018 1157    BMET    Component Value Date/Time   NA 132 (L) 08/09/2018 1157   NA 132 (L) 01/03/2018 0955   K 4.3 08/09/2018 1157   CL 93 (L) 08/09/2018 1157   CO2 31 08/09/2018 1157   GLUCOSE 102 (H) 08/09/2018 1157   BUN 55 (H)  08/09/2018 1157   BUN 34 (H) 01/03/2018 0955   CREATININE 2.19 (H) 08/09/2018 1157   CALCIUM 9.6 08/09/2018 1157   GFRNONAA 27 (L) 06/07/2018 0803   GFRAA 32 (L) 06/07/2018 0803    BNP    Component Value Date/Time   BNP 172.1 (H) 05/12/2018 1457    ProBNP    Component Value Date/Time   PROBNP 557 (H) 11/14/2017 0812   PROBNP 130.6 (H) 11/19/2011 2155      Assessment & Plan:     CHF (congestive heart failure) (HCC) Assessment: Weight is 219 which is the same weight she was when she was seen by cardiology last month and sent to the hospital Patient reports she is weighing herself daily but she cannot tell me what it was, patient thinks she was 209 pounds this morning 2+ lower extremity edema bilaterally on exam today Patient reporting that clothes are fitting tighter than usual probably has abdominal edema Patient is taking her fluid pills and is actually taking more doses than what is prescribed on her own accord  Plan: Lab work today Referral to nephrology for outpatient management Instructed patient to schedule follow-up with cardiology I believe she needs to be evaluated sooner than the 36-month follow-up they have plan Chest x-ray today  CKD (chronic kidney disease), stage III ([place]) Assessment: Chronic kidney disease stage IIIb  Plan: Lab work Referral to nephrology outpatient  Chronic respiratory failure with hypoxia ([place]) Assessment: Walk in January/2020 office visit reveals the patient needs 2 L via nasal cannula with exertion Patient presents to our office today on room air satting 84%  Plan: Patient sent home on [facility] tank to maintain oxygen saturations greater than 90% Patient would like a POC, explained to them that [facility] does not have POC's We will do an ambulatory DME referral to see if  the patient can get set up with 1 or patient can get smaller E tanks for portable use   Asthma Assessment: BMI 44.29 Maintained well on  Brio Ellipta inhaler Using rescue inhaler daily scheduled without need Very sedentary lifestyle Lungs clear to auscultation today  Plan: Continue Breo Ellipta inhaler daily Use rescue inhaler every 6 hours as needed for shortness of breath Contact our office if you have concerns regarding your breathing      Lauraine Rinne, NP 08/09/2018   This appointment was 44 min long with over 50% of the time in direct face-to-face patient care, assessment, plan of care, and follow-up.

## 2018-08-09 ENCOUNTER — Ambulatory Visit (INDEPENDENT_AMBULATORY_CARE_PROVIDER_SITE_OTHER)
Admission: RE | Admit: 2018-08-09 | Discharge: 2018-08-09 | Disposition: A | Discharge status: Home / Self Care | Payer: Medicare Other | Source: Ambulatory Visit | Attending: Pulmonary Disease | Admitting: Pulmonary Disease

## 2018-08-09 ENCOUNTER — Other Ambulatory Visit: Payer: Self-pay

## 2018-08-09 ENCOUNTER — Ambulatory Visit (INDEPENDENT_AMBULATORY_CARE_PROVIDER_SITE_OTHER): Payer: Medicare Other | Admitting: Pulmonary Disease

## 2018-08-09 ENCOUNTER — Encounter: Payer: Self-pay | Admitting: Pulmonary Disease

## 2018-08-09 VITALS — BP 142/80 | HR 74 | Temp 98.5°F | Ht 59.0 in | Wt 219.3 lb

## 2018-08-09 DIAGNOSIS — J4540 Moderate persistent asthma, uncomplicated: Secondary | ICD-10-CM

## 2018-08-09 DIAGNOSIS — N183 Chronic kidney disease, stage 3 unspecified: Secondary | ICD-10-CM

## 2018-08-09 DIAGNOSIS — I5032 Chronic diastolic (congestive) heart failure: Secondary | ICD-10-CM

## 2018-08-09 DIAGNOSIS — R0602 Shortness of breath: Secondary | ICD-10-CM

## 2018-08-09 DIAGNOSIS — J9611 Chronic respiratory failure with hypoxia: Secondary | ICD-10-CM

## 2018-08-09 LAB — COMPREHENSIVE METABOLIC PANEL
ALT: 16 U/L (ref 0–35)
AST: 15 U/L (ref 0–37)
Albumin: 4 g/dL (ref 3.5–5.2)
Alkaline Phosphatase: 68 U/L (ref 39–117)
BUN: 55 mg/dL — ABNORMAL HIGH (ref 6–23)
CO2: 31 mEq/L (ref 19–32)
Calcium: 9.6 mg/dL (ref 8.4–10.5)
Chloride: 93 mEq/L — ABNORMAL LOW (ref 96–112)
Creatinine, Ser: 2.19 mg/dL — ABNORMAL HIGH (ref 0.40–1.20)
GFR: 26.49 mL/min — ABNORMAL LOW (ref >60.00)
Glucose, Bld: 102 mg/dL — ABNORMAL HIGH (ref 70–99)
Potassium: 4.3 mEq/L (ref 3.5–5.1)
Sodium: 132 mEq/L — ABNORMAL LOW (ref 135–145)
Total Bilirubin: 0.4 mg/dL (ref 0.2–1.2)
Total Protein: 6.9 g/dL (ref 6.0–8.3)

## 2018-08-09 LAB — CBC WITH DIFFERENTIAL/PLATELET
Basophils Absolute: 0 10*3/uL (ref 0.0–0.1)
Basophils Relative: 0.4 % (ref 0.0–3.0)
Eosinophils Absolute: 0.1 10*3/uL (ref 0.0–0.7)
Eosinophils Relative: 1.3 % (ref 0.0–5.0)
HCT: 26.2 % — ABNORMAL LOW (ref 36.0–46.0)
Hemoglobin: 8.6 g/dL — ABNORMAL LOW (ref 12.0–15.0)
Lymphocytes Relative: 9.1 % — ABNORMAL LOW (ref 12.0–46.0)
Lymphs Abs: 0.9 10*3/uL (ref 0.7–4.0)
MCHC: 32.9 g/dL (ref 30.0–36.0)
MCV: 84.4 fl (ref 78.0–100.0)
Monocytes Absolute: 0.9 10*3/uL (ref 0.1–1.0)
Monocytes Relative: 9.4 % (ref 3.0–12.0)
Neutro Abs: 7.8 10*3/uL — ABNORMAL HIGH (ref 1.4–7.7)
Neutrophils Relative %: 79.8 % — ABNORMAL HIGH (ref 43.0–77.0)
Platelets: 316 10*3/uL (ref 150.0–400.0)
RBC: 3.1 Mil/uL — ABNORMAL LOW (ref 3.87–5.11)
RDW: 16.8 % — ABNORMAL HIGH (ref 11.5–15.5)
WBC: 9.8 10*3/uL (ref 4.0–10.5)

## 2018-08-09 NOTE — Assessment & Plan Note (Signed)
Assessment: Walk in January/2020 office visit reveals the patient needs 2 L via nasal cannula with exertion Patient presents to our office today on room air satting 84%  Plan: Patient sent home on [facility] tank to maintain oxygen saturations greater than 90% Patient would like a POC, explained to them that [facility] does not have POC's We will do an ambulatory DME referral to see if the patient can get set up with 1 or patient can get smaller E tanks for portable use

## 2018-08-09 NOTE — Assessment & Plan Note (Signed)
Assessment: BMI 44.29 Very deconditioned elderly female Sedentary lifestyle  Plan: Continue to try to increase daily activity Use 2 L of O2 with exertion Follow-up with our office in 4 weeks

## 2018-08-09 NOTE — Assessment & Plan Note (Signed)
Assessment: Chronic kidney disease stage IIIb  Plan: Lab work Referral to nephrology outpatient

## 2018-08-09 NOTE — Assessment & Plan Note (Addendum)
Assessment: Weight is 219 which is the same weight she was when she was seen by cardiology last month and sent to the hospital Patient reports she is weighing herself daily but she cannot tell me what it was, patient thinks she was 209 pounds this morning 2+ lower extremity edema bilaterally on exam today Patient reporting that clothes are fitting tighter than usual probably has abdominal edema Patient is taking her fluid pills and is actually taking more doses than what is prescribed on her own accord  Plan: Lab work today Referral to nephrology for outpatient management Instructed patient to schedule follow-up with cardiology I believe she needs to be evaluated sooner than the 52-month follow-up they have plan Chest x-ray today

## 2018-08-09 NOTE — Progress Notes (Signed)
Your chest x-ray results of come back.  Showing no acute changes.  No plan of care changes at this time.  Keep follow-up appointment.    Follow-up with our office if symptoms worsen or you do not feel like you are improving under her current regimen.  It was a pleasure taking care of you,  Brian Mack, FNP 

## 2018-08-09 NOTE — Patient Instructions (Addendum)
Labwork today   Chest Xray today   Breo Ellipta 200 >>> Take 1 puff daily in the morning right when you wake up >>>Rinse your mouth out after use >>>This is a daily maintenance inhaler, NOT a rescue inhaler >>>Contact our office if you are having difficulties affording or obtaining this medication >>>It is important for you to be able to take this daily and not miss any doses  Only use your albuterol as a rescue medication to be used if you can't catch your breath by resting or doing a relaxed purse lip breathing pattern.  - The less you use it, the better it will work when you need it. - Ok to use up to 2 puffs every 4 hours if you must but call for immediate appointment if use goes up over your usual need - Don't leave home without it !! (think of it like the spare tire for your car)   Continue oxygen therapy as prescribed  - 2L with exertion  >>>maintain oxygen saturations greater than 88 percent  >>>if unable to maintain oxygen saturations please contact the office  >>>do not smoke with oxygen  >>>can use nasal saline gel or nasal saline rinses to moisturize nose if oxygen causes dryness   Increase physical activity every day  Walk during commercial breaks when watching TV  Can start doing Silver sneakers   Follow-up with cardiology - schedule appt  >>> Weigh yourself daily to monitor for fluid >>> Follow low-sodium diet  Referral to nephrology   Follow-up with our office in 4 weeks     It is flu season:   >>> Best ways to protect herself from the flu: Receive the yearly flu vaccine, practice good hand hygiene washing with soap and also using hand sanitizer when available, eat a nutritious meals, get adequate rest, hydrate appropriately   Please contact the office if your symptoms worsen or you have concerns that you are not improving.   Thank you for choosing [facility] for your healthcare, and for allowing [place] to partner with you on your  healthcare journey. I am thankful to be able to provide care to you today.   Wyn Quaker FNP-C

## 2018-08-09 NOTE — Assessment & Plan Note (Signed)
Assessment: BMI 44.29 Maintained well on Brio Ellipta inhaler Using rescue inhaler daily scheduled without need Very sedentary lifestyle Lungs clear to auscultation today  Plan: Continue Breo Ellipta inhaler daily Use rescue inhaler every 6 hours as needed for shortness of breath Contact our office if you have concerns regarding your breathing

## 2018-08-09 NOTE — Assessment & Plan Note (Signed)
Plan: Lab work today Chest x-ray today

## 2018-08-10 ENCOUNTER — Ambulatory Visit (INDEPENDENT_AMBULATORY_CARE_PROVIDER_SITE_OTHER): Payer: Medicare Other | Admitting: Interventional Cardiology

## 2018-08-10 ENCOUNTER — Encounter: Payer: Self-pay | Admitting: Interventional Cardiology

## 2018-08-10 VITALS — BP 140/60 | HR 75 | Ht 59.0 in | Wt 219.4 lb

## 2018-08-10 DIAGNOSIS — N183 Chronic kidney disease, stage 3 unspecified: Secondary | ICD-10-CM

## 2018-08-10 DIAGNOSIS — I10 Essential (primary) hypertension: Secondary | ICD-10-CM

## 2018-08-10 DIAGNOSIS — E782 Mixed hyperlipidemia: Secondary | ICD-10-CM

## 2018-08-10 DIAGNOSIS — R600 Localized edema: Secondary | ICD-10-CM

## 2018-08-10 DIAGNOSIS — I5032 Chronic diastolic (congestive) heart failure: Secondary | ICD-10-CM
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
## 2018-08-10 NOTE — Patient Instructions (Signed)
Medication Instructions:  Your physician recommends that you continue on your current medications as directed. Please refer to the Current Medication list given to you today.  If you need a refill on your cardiac medications before your next appointment, please call your pharmacy.   Lab work: None Ordered  If you have labs (blood work) drawn today and your tests are completely normal, you will receive your results only by: Marland Kitchen MyChart Message (if you have MyChart) OR . A paper copy in the mail If you have any lab test that is abnormal or we need to change your treatment, we will call you to review the results.  Testing/Procedures: None ordered  Follow-Up: At [facility], you and your health needs are our priority.  As part of our continuing mission to provide you with exceptional heart care, we have created designated Provider Care Teams.  These Care Teams include your primary Cardiologist (physician) and Advanced Practice Providers (APPs -  Physician Assistants and Nurse Practitioners) who all work together to provide you with the care you need, when you need it. . You will need a follow up appointment in 4 MONTHS.  Please call our office 2 months in advance to schedule this appointment.  You may see one of the following Advanced Practice Providers on your designated Care Team:   . Lyda Jester, PA-C . Dayna Dunn, PA-C . Ermalinda Barrios, PA-C  Any Other Special Instructions Will Be Listed Below (If Applicable).  Two Gram Sodium Diet 2000 mg  What is Sodium? Sodium is a mineral found naturally in many foods. The most significant source of sodium in the diet is table salt, which is about 40% sodium.  Processed, convenience, and preserved foods also contain a large amount of sodium.  The body needs only 500 mg of sodium daily to function,  A normal diet provides more than enough sodium even if you do not use salt.  Why Limit Sodium? A build up of sodium in the body can cause thirst,  increased blood pressure, shortness of breath, and water retention.  Decreasing sodium in the diet can reduce edema and risk of heart attack or stroke associated with high blood pressure.  Keep in mind that there are many other factors involved in these health problems.  Heredity, obesity, lack of exercise, cigarette smoking, stress and what you eat all play a role.  General Guidelines:  Do not add salt at the table or in cooking.  One teaspoon of salt contains over 2 grams of sodium.  Read food labels  Avoid processed and convenience foods  Ask your dietitian before eating any foods not dicussed in the menu planning guidelines  Consult your physician if you wish to use a salt substitute or a sodium containing medication such as antacids.  Limit milk and milk products to 16 oz (2 cups) per day.  Shopping Hints:  READ LABELS!! "Dietetic" does not necessarily mean low sodium.  Salt and other sodium ingredients are often added to foods during processing.   Menu Planning Guidelines Food Group Choose More Often Avoid  Beverages (see also the milk group All fruit juices, low-sodium, salt-free vegetables juices, low-sodium carbonated beverages Regular vegetable or tomato juices, commercially softened water used for drinking or cooking  Breads and Cereals Enriched white, wheat, rye and pumpernickel bread, hard rolls and dinner rolls; muffins, cornbread and waffles; most dry cereals, cooked cereal without added salt; unsalted crackers and breadsticks; low sodium or homemade bread crumbs Bread, rolls and crackers with  salted tops; quick breads; instant hot cereals; pancakes; commercial bread stuffing; self-rising flower and biscuit mixes; regular bread crumbs or cracker crumbs  Desserts and Sweets Desserts and sweets mad with mild should be within allowance Instant pudding mixes and cake mixes  Fats Butter or margarine; vegetable oils; unsalted salad dressings, regular salad dressings limited to 1  Tbs; light, sour and heavy cream Regular salad dressings containing bacon fat, bacon bits, and salt pork; snack dips made with instant soup mixes or processed cheese; salted nuts  Fruits Most fresh, frozen and canned fruits Fruits processed with salt or sodium-containing ingredient (some dried fruits are processed with sodium sulfites        Vegetables Fresh, frozen vegetables and low- sodium canned vegetables Regular canned vegetables, sauerkraut, pickled vegetables, and others prepared in brine; frozen vegetables in sauces; vegetables seasoned with ham, bacon or salt pork  Condiments, Sauces, Miscellaneous  Salt substitute with physician's approval; pepper, herbs, spices; vinegar, lemon or lime juice; hot pepper sauce; garlic powder, onion powder, low sodium soy sauce (1 Tbs.); low sodium condiments (ketchup, chili sauce, mustard) in limited amounts (1 tsp.) fresh ground horseradish; unsalted tortilla chips, pretzels, potato chips, popcorn, salsa (1/4 cup) Any seasoning made with salt including garlic salt, celery salt, onion salt, and seasoned salt; sea salt, rock salt, kosher salt; meat tenderizers; monosodium glutamate; mustard, regular soy sauce, barbecue, sauce, chili sauce, teriyaki sauce, steak sauce, Worcestershire sauce, and most flavored vinegars; canned gravy and mixes; regular condiments; salted snack foods, olives, picles, relish, horseradish sauce, catsup   Food preparation: Try these seasonings Meats:    Pork Sage, onion Serve with applesauce  Chicken Poultry seasoning, thyme, parsley Serve with cranberry sauce  Lamb Curry powder, rosemary, garlic, thyme Serve with mint sauce or jelly  Veal Marjoram, basil Serve with current jelly, cranberry sauce  Beef Pepper, bay leaf Serve with dry mustard, unsalted chive butter  Fish [place] leaf, dill Serve with unsalted lemon butter, unsalted parsley butter  Vegetables:    Asparagus Lemon juice   Broccoli Lemon juice   Carrots Mustard  dressing parsley, mint, nutmeg, glazed with unsalted butter and sugar   Green beans Marjoram, lemon juice, nutmeg,dill seed   Tomatoes Basil, marjoram, onion   Spice /blend for [employer]" 4 tsp ground thyme 1 tsp ground sage 3 tsp ground rosemary 4 tsp ground marjoram   Test your knowledge 1. A product that says "Salt Free" may still contain sodium. True or False 2. Garlic Powder and Hot Pepper Sauce an be used as alternative seasonings.True or False 3. Processed foods have more sodium than fresh foods.  True or False 4. Canned Vegetables have less sodium than froze True or False  WAYS TO DECREASE YOUR SODIUM INTAKE 1. Avoid the use of added salt in cooking and at the table.  Table salt (and other prepared seasonings which contain salt) is probably one of the greatest sources of sodium in the diet.  Unsalted foods can gain flavor from the sweet, sour, and butter taste sensations of herbs and spices.  Instead of using salt for seasoning, try the following seasonings with the foods listed.  Remember: how you use them to enhance natural food flavors is limited only by your creativity... Allspice-Meat, fish, eggs, fruit, peas, red and yellow vegetables Almond Extract-Fruit baked goods Anise Seed-Sweet breads, fruit, carrots, beets, cottage cheese, cookies (tastes like licorice) Basil-Meat, fish, eggs, vegetables, rice, vegetables salads, soups, sauces Bay Leaf-Meat, fish, stews, poultry Burnet-Salad, vegetables (cucumber-like flavor) Caraway Seed-Bread,  cookies, cottage cheese, meat, vegetables, cheese, rice Cardamon-Baked goods, fruit, soups Celery Powder or seed-Salads, salad dressings, sauces, meatloaf, soup, bread.Do not use  celery salt Chervil-Meats, salads, fish, eggs, vegetables, cottage cheese (parsley-like flavor) Chili Power-Meatloaf, chicken cheese, corn, eggplant, egg dishes Chives-Salads cottage cheese, egg dishes, soups, vegetables, sauces Cilantro-Salsa,  casseroles Cinnamon-Baked goods, fruit, pork, lamb, chicken, carrots Cloves-Fruit, baked goods, fish, pot roast, green beans, beets, carrots Coriander-Pastry, cookies, meat, salads, cheese (lemon-orange flavor) Cumin-Meatloaf, fish,cheese, eggs, cabbage,fruit pie (caraway flavor) [employer], fruit, eggs, fish, poultry, cottage cheese, vegetables Dill Seed-Meat, cottage cheese, poultry, vegetables, fish, salads, bread Fennel Seed-Bread, cookies, apples, pork, eggs, fish, beets, cabbage, cheese, Licorice-like flavor Garlic-(buds or powder) Salads, meat, poultry, fish, bread, butter, vegetables, potatoes.Do not  use garlic salt Ginger-Fruit, vegetables, baked goods, meat, fish, poultry Horseradish Root-Meet, vegetables, butter Lemon Juice or Extract-Vegetables, fruit, tea, baked goods, fish salads Mace-Baked goods fruit, vegetables, fish, poultry (taste like nutmeg) Maple Extract-Syrups Marjoram-Meat, chicken, fish, vegetables, breads, green salads (taste like Sage) Mint-Tea, lamb, sherbet, vegetables, desserts, carrots, cabbage Mustard, Dry or Seed-Cheese, eggs, meats, vegetables, poultry Nutmeg-Baked goods, fruit, chicken, eggs, vegetables, desserts Onion Powder-Meat, fish, poultry, vegetables, cheese, eggs, bread, rice salads (Do not use   Onion salt) Orange Extract-Desserts, baked goods Oregano-Pasta, eggs, cheese, onions, pork, lamb, fish, chicken, vegetables, green salads Paprika-Meat, fish, poultry, eggs, cheese, vegetables Parsley Flakes-Butter, vegetables, meat fish, poultry, eggs, bread, salads (certain forms may   Contain sodium Pepper-Meat fish, poultry, vegetables, eggs Peppermint Extract-Desserts, baked goods Poppy Seed-Eggs, bread, cheese, fruit dressings, baked goods, noodles, vegetables, cottage  [employer], poultry, meat, fish, cauliflower, turnips,eggs bread Saffron-Rice, bread, veal, chicken, fish, eggs Sage-Meat, fish,  poultry, onions, eggplant, tomateos, pork, stews Savory-Eggs, salads, poultry, meat, rice, vegetables, soups, pork Tarragon-Meat, poultry, fish, eggs, butter, vegetables (licorice-like flavor)  Thyme-Meat, poultry, fish, eggs, vegetables, (clover-like flavor), sauces, soups Tumeric-Salads, butter, eggs, fish, rice, vegetables (saffron-like flavor) Vanilla Extract-Baked goods, candy Vinegar-Salads, vegetables, meat marinades Walnut Extract-baked goods, candy  2. Choose your Foods Wisely   The following is a list of foods to avoid which are high in sodium:  Meats-Avoid all smoked, canned, salt cured, dried and kosher meat and fish as well as Anchovies   [employer] meats:Bologna, Liverwurst, Pastrami Canned meat or fish  Marinated herring Caviar    Pepperoni Corned Beef   Pizza Dried chipped beef  Salami Frozen breaded fish or meat Salt pork Frankfurters or hot dogs  Sardines Gefilte fish   Sausage Ham (boiled ham, Proscuitto Smoked butt    spiced ham)   Spam      TV Dinners Vegetables Canned vegetables (Regular) Relish Canned mushrooms  Sauerkraut Olives    Tomato juice Pickles  Bakery and Dessert Products Canned puddings  Cream pies Cheesecake   Decorated cakes Cookies  Beverages/Juices Tomato juice, regular  Gatorade   V-8 vegetable juice, regular  Breads and Cereals Biscuit mixes   Salted potato chips, corn chips, pretzels Bread stuffing mixes  Salted crackers and rolls Pancake and waffle mixes Self-rising flour  Seasonings Accent    Meat sauces Barbecue sauce  Meat tenderizer Catsup    Monosodium glutamate (MSG) Celery salt   Onion salt Chili sauce   Prepared mustard Garlic salt   Salt, seasoned salt, sea salt Gravy mixes   Soy sauce Horseradish   Steak sauce Ketchup   Tartar sauce Lite salt    Teriyaki sauce Marinade mixes   Worcestershire sauce  Others Baking powder  Cocoa and cocoa mixes Baking soda   Commercial casserole  mixes Candy-caramels, chocolate  Dehydrated soups    Bars, fudge,nougats  Instant rice and pasta mixes Canned broth or soup  Maraschino cherries Cheese, aged and processed cheese and cheese spreads  Learning Assessment Quiz  Indicated T (for True) or F (for False) for each of the following statements:  1. _____ Fresh fruits and vegetables and unprocessed grains are generally low in sodium 2. _____ Water may contain a considerable amount of sodium, depending on the source 3. _____ You can always tell if a food is high in sodium by tasting it 4. _____ Certain laxatives my be high in sodium and should be avoided unless prescribed   by a physician or pharmacist 5. _____ Salt substitutes may be used freely by anyone on a sodium restricted diet 6. _____ Sodium is present in table salt, food additives and as a natural component of   most foods 7. _____ Table salt is approximately 90% sodium 8. _____ Limiting sodium intake may help prevent excess fluid accumulation in the body 9. _____ On a sodium-restricted diet, seasonings such as bouillon soy sauce, and    cooking wine should be used in place of table salt 10. _____ On an ingredient list, a product which lists monosodium glutamate as the first   ingredient is an appropriate food to include on a low sodium diet  Circle the best answer(s) to the following statements (Hint: there may be more than one correct answer)  11. On a low-sodium diet, some acceptable snack items are:    A. Olives  F. Bean dip   K. Grapefruit juice    B. Salted Pretzels G. Commercial Popcorn   L. Canned peaches    C. Carrot Sticks  H. Bouillon   M. Unsalted nuts   D. [place] fries  I. Peanut butter crackers N. Salami   E. Sweet pickles J. Tomato Juice   O. Pizza  12.  Seasonings that may be used freely on a reduced - sodium diet include   A. Lemon wedges F.Monosodium glutamate K. Celery seed    B.Soysauce   G. Pepper   L. Mustard powder   C. Sea salt  H.  Cooking wine  M. Onion flakes   D. Vinegar  E. Prepared horseradish N. Salsa   E. Sage   J. Worcestershire sauce  O. Chutney

## 2018-08-11 LAB — PRO B NATRIURETIC PEPTIDE: NT-Pro BNP: 2064 pg/mL — ABNORMAL HIGH (ref 0–301)

## 2018-08-12 NOTE — Progress Notes (Signed)
Please contact the patient and let them know that her labs were elevated as we thought. She needs immediate follow up with cardiology and I referred her to renal for her kidneys. Labs so worsened kidney functioning and holding on to fluid. She needs to contact cardiology to discuss the lab work and see if they would like for her to present to an ER for IV diuresis. Especially in light of her over use of diuretics prior to lab being drawn.     Please also route BNP result to cardiology.   Wyn Quaker FNP

## 2018-08-13 ENCOUNTER — Telehealth: Payer: Self-pay | Admitting: Interventional Cardiology

## 2018-08-13 NOTE — Progress Notes (Signed)
Thank you   Brian  

## 2018-08-13 NOTE — Progress Notes (Signed)
Spoke with the pt and notified of results and recommendations. She states seen by Dr Irish Lack 08/10/2018. Pt will call his office to discuss labs. She is aware to go to ED should her breathing get worse. Copy of labs to Dr Irish Lack.

## 2018-08-13 NOTE — Telephone Encounter (Signed)
Returned call to patient. Patient states that she was called about her labs from 3/12 that were drawn by her pulmonologist that showed that her Cr was 2.19 and BNP 2,064. She states that she was advised to reach out to cardiology for recommendations regarding her diuretics. Patient states that she has some swelling in her lower extremities. She denies SOB. She states that her weighs 212 lbs today which is down 2 lbs from yesterday. Instructed patient to continue her current dose of torsemide for now, elevate legs to help with swelling and to avoid foods that are high in salt. Instructed patient to let us know if her Sx change or worsen.

## 2018-08-13 NOTE — Telephone Encounter (Signed)
° ° °  Pt c/o swelling: STAT is pt has developed SOB within 24 hours  1) How much weight have you gained and in what time span? N/A  2) If swelling, where is the swelling located? LEGS, FEET, ANKLES  3) Are you currently taking a fluid pill? YES  4) Are you currently SOB? NO  5) Do you have a log of your daily weights (if so, list)? 212 TODAY ,  214  Have you gained 3 pounds in a day or 5 pounds in a week? NO 6) Have you traveled recently? NO

## 2018-08-21 ENCOUNTER — Ambulatory Visit: Payer: Medicare Other | Admitting: Podiatry

## 2018-09-07 ENCOUNTER — Other Ambulatory Visit: Payer: Self-pay | Admitting: Pulmonary Disease

## 2018-09-10 ENCOUNTER — Ambulatory Visit: Payer: Medicare Other | Admitting: Pulmonary Disease

## 2018-09-10 ENCOUNTER — Other Ambulatory Visit: Payer: Self-pay

## 2018-09-10 ENCOUNTER — Encounter: Payer: Self-pay | Admitting: Pulmonary Disease

## 2018-09-10 ENCOUNTER — Ambulatory Visit (INDEPENDENT_AMBULATORY_CARE_PROVIDER_SITE_OTHER): Payer: Medicare Other | Admitting: Pulmonary Disease

## 2018-09-10 DIAGNOSIS — I5032 Chronic diastolic (congestive) heart failure: Secondary | ICD-10-CM | POA: Diagnosis not present

## 2018-09-10 DIAGNOSIS — E118 Type 2 diabetes mellitus with unspecified complications: Secondary | ICD-10-CM | POA: Diagnosis not present

## 2018-09-10 DIAGNOSIS — J4540 Moderate persistent asthma, uncomplicated: Secondary | ICD-10-CM | POA: Diagnosis not present

## 2018-09-10 NOTE — Patient Instructions (Addendum)
Today I referred you to a dietician for management of your low salt, low carbohydrate, and high fiber diet.   Breo Ellipta 200 >>>Take 1 puff daily in the morning right when you wake up >>>Rinse your mouth out after use >>>This is a daily maintenance inhaler, NOTa rescue inhaler >>>Contact our office if you are having difficulties affording or obtaining this medication >>>It is important for you to be able to take this daily and not miss any doses  Only use your albuterol as a rescue medication to be used if you can't catch your breath by resting or doing a relaxed purse lip breathing pattern.  - The less you use it, the better it will work when you need it. - Ok to use up to 2 puffs every 4 hours if you must but call for immediate appointment if use goes up over your usual need - Don't leave home without it !! (think of it like the spare tire for your car)  Continue oxygen therapy as prescribed- 2L with exertion >>>maintain oxygen saturations greater than 88 percent  >>>if unable to maintain oxygen saturations please contact the office  >>>do not smoke with oxygen  >>>can use nasal saline gel or nasal saline rinses to moisturize nose if oxygen causes dryness   Increase physical activity every day  Walk during commercial breaks when watching TV  Can start doing Silver sneakers  Follow up with Kidney Doctor   Follow-up with cardiology >>>Weigh yourself daily to monitor for fluid >>>Follow low-sodium diet  Patient Education for Congestive Heart Failure  Do the following things EVERY DAY:   1. Weigh yourself EVERY morning after you go to the bathroom but before you eat or drink anything. Write this number down in a weight log/diary.    2. Take your medicines as prescribed. If you have concerns about your medications, please call us before you stop taking them.    3. Eat low salt foods-Limit salt (sodium) to 2000 mg per day. This will help prevent your body from  holding onto fluid. Read food labels as many processed foods have a lot of sodium, especially canned goods and prepackaged meats. If you would like some assistance choosing low sodium foods, we would be happy to set you up with a nutritionist.   4. Stay as active as you can everyday. Staying active will give you more energy and make your muscles stronger. Start with 5 minutes at a time and work your way up to 30 minutes a day. Break up your activities--do some in the morning and some in the afternoon. Start with 3 days per week and work your way up to 5 days as you can.  If you have chest pain, feel short of breath, dizzy, or lightheaded, STOP. If you don't feel better after a short rest, call 911. If you do feel better, call the office to let us know you have symptoms with exercise.   5. Limit all fluids for the day to less than 2 liters. Fluid includes all drinks, coffee, juice, ice chips, soup, jello, and all other liquids.    Return in about 3 months (around 12/10/2018), or if symptoms worsen or fail to improve, for Follow up with Dr. Vaughan Browner, Follow up with Wyn Quaker FNP-C.    Coronavirus (COVID-19) Are you at risk?  Are you at risk for the Coronavirus (COVID-19)?  To be considered HIGH RISK for Coronavirus (COVID-19), you have to meet the following criteria:  . Traveled to [place],  [place], [place], [place] or [place]; or in the [place] to [place], [place], [place], or [place]; and have fever, cough, and shortness of breath within the last 2 weeks of travel OR . Been in close contact with a person diagnosed with COVID-19 within the last 2 weeks and have fever, cough, and shortness of breath . IF YOU DO NOT MEET THESE CRITERIA, YOU ARE CONSIDERED LOW RISK FOR COVID-19.  What to do if you are HIGH RISK for COVID-19?  Marland Kitchen If you are having a medical emergency, call 911. . Seek medical care right away. Before you go to a doctor's office, urgent care or emergency department, call  ahead and tell them about your recent travel, contact with someone diagnosed with COVID-19, and your symptoms. You should receive instructions from your physician's office regarding next steps of care.  . When you arrive at healthcare provider, tell the healthcare staff immediately you have returned from visiting [place], [place], [place], [place] or [place]; or traveled in the [place] to [place], [place], [place], or [place]; in the last two weeks or you have been in close contact with a person diagnosed with COVID-19 in the last 2 weeks.   . Tell the health care staff about your symptoms: fever, cough and shortness of breath. . After you have been seen by a medical provider, you will be either: o Tested for (COVID-19) and discharged home on quarantine except to seek medical care if symptoms worsen, and asked to  - Stay home and avoid contact with others until you get your results (4-5 days)  - Avoid travel on public transportation if possible (such as bus, train, or airplane) or o Sent to the Emergency Department by EMS for evaluation, COVID-19 testing, and possible admission depending on your condition and test results.  What to do if you are LOW RISK for COVID-19?  Reduce your risk of any infection by using the same precautions used for avoiding the common cold or flu:  Marland Kitchen Wash your hands often with soap and warm water for at least 20 seconds.  If soap and water are not readily available, use an alcohol-based hand sanitizer with at least 60% alcohol.  . If coughing or sneezing, cover your mouth and nose by coughing or sneezing into the elbow areas of your shirt or coat, into a tissue or into your sleeve (not your hands). . Avoid shaking hands with others and consider head nods or verbal greetings only. . Avoid touching your eyes, nose, or mouth with unwashed hands.  . Avoid close contact with people who are sick. . Avoid places or events with large numbers of people in one location,  like concerts or sporting events. . Carefully consider travel plans you have or are making. . If you are planning any travel outside or inside the [place], visit the CDC's Travelers' Health webpage for the latest health notices. . If you have some symptoms but not all symptoms, continue to monitor at home and seek medical attention if your symptoms worsen. . If you are having a medical emergency, call 911.   [place] / e-Visit: [email or web address]         [facility]: [facility]: [phone]                   [facility]: [phone]  It is flu season:   >>> Best ways to protect herself from the flu: Receive the yearly flu vaccine, practice good hand hygiene washing with soap and also using hand sanitizer when available, eat a nutritious meals, get adequate rest, hydrate appropriately   Please contact the office if your symptoms worsen or you have concerns that you are not improving.   Thank you for choosing [facility] for your healthcare, and for allowing [place] to partner with you on your healthcare journey. I am thankful to be able to provide care to you today.   Wyn Quaker FNP-C      Low-Sodium Eating Plan Sodium, which is an element that makes up salt, helps you maintain a healthy balance of fluids in your body. Too much sodium can increase your blood pressure and cause fluid and waste to be held in your body. Your health care provider or dietitian may recommend following this plan if you have high blood pressure (hypertension), kidney disease, liver disease, or heart failure. Eating less sodium can help lower your blood pressure, reduce swelling, and protect your heart, liver, and kidneys. What are tips for following this plan? General guidelines  Most people on this plan should limit their sodium  intake to 1,500-2,000 mg (milligrams) of sodium each day. Reading food labels   The Nutrition Facts label lists the amount of sodium in one serving of the food. If you eat more than one serving, you must multiply the listed amount of sodium by the number of servings.  Choose foods with less than 140 mg of sodium per serving.  Avoid foods with 300 mg of sodium or more per serving. Shopping  Look for lower-sodium products, often labeled as "low-sodium" or "no salt added."  Always check the sodium content even if foods are labeled as "unsalted" or "no salt added".  Buy fresh foods. ? Avoid canned foods and premade or frozen meals. ? Avoid canned, cured, or processed meats  Buy breads that have less than 80 mg of sodium per slice. Cooking  Eat more home-cooked food and less restaurant, buffet, and fast food.  Avoid adding salt when cooking. Use salt-free seasonings or herbs instead of table salt or sea salt. Check with your health care provider or pharmacist before using salt substitutes.  Cook with plant-based oils, such as canola, sunflower, or olive oil. Meal planning  When eating at a restaurant, ask that your food be prepared with less salt or no salt, if possible.  Avoid foods that contain MSG (monosodium glutamate). MSG is sometimes added to [place] food, bouillon, and some canned foods. What foods are recommended? The items listed may not be a complete list. Talk with your dietitian about what dietary choices are best for you. Grains Low-sodium cereals, including oats, puffed wheat and rice, and shredded wheat. Low-sodium crackers. Unsalted rice. Unsalted pasta. Low-sodium bread. Whole-grain breads and whole-grain pasta. Vegetables Fresh or frozen vegetables. "No salt added" canned vegetables. "No salt added" tomato sauce and paste. Low-sodium or reduced-sodium tomato and vegetable juice. Fruits Fresh, frozen, or canned fruit. Fruit juice. Meats and other protein  foods Fresh or frozen (no salt added) meat, poultry, seafood, and fish. Low-sodium canned tuna and salmon. Unsalted nuts. Dried peas, beans, and lentils without added salt. Unsalted canned beans. Eggs. Unsalted nut butters. Dairy Milk. Soy milk. Cheese that is naturally low in sodium, such as ricotta cheese, fresh mozzarella, or Swiss cheese Low-sodium or reduced-sodium cheese. Cream cheese. Yogurt. Fats and oils Unsalted butter.  Unsalted margarine with no trans fat. Vegetable oils such as canola or olive oils. Seasonings and other foods Fresh and dried herbs and spices. Salt-free seasonings. Low-sodium mustard and ketchup. Sodium-free salad dressing. Sodium-free light mayonnaise. Fresh or refrigerated horseradish. Lemon juice. Vinegar. Homemade, reduced-sodium, or low-sodium soups. Unsalted popcorn and pretzels. Low-salt or salt-free chips. What foods are not recommended? The items listed may not be a complete list. Talk with your dietitian about what dietary choices are best for you. Grains Instant hot cereals. Bread stuffing, pancake, and biscuit mixes. Croutons. Seasoned rice or pasta mixes. Noodle soup cups. Boxed or frozen macaroni and cheese. Regular salted crackers. Self-rising flour. Vegetables Sauerkraut, pickled vegetables, and relishes. Olives. [place] fries. Onion rings. Regular canned vegetables (not low-sodium or reduced-sodium). Regular canned tomato sauce and paste (not low-sodium or reduced-sodium). Regular tomato and vegetable juice (not low-sodium or reduced-sodium). Frozen vegetables in sauces. Meats and other protein foods Meat or fish that is salted, canned, smoked, spiced, or pickled. Bacon, ham, sausage, hotdogs, corned beef, chipped beef, packaged lunch meats, salt pork, jerky, pickled herring, anchovies, regular canned tuna, sardines, salted nuts. Dairy Processed cheese and cheese spreads. Cheese curds. Blue cheese. Feta cheese. String cheese. Regular cottage cheese.  Buttermilk. Canned milk. Fats and oils Salted butter. Regular margarine. Ghee. Bacon fat. Seasonings and other foods Onion salt, garlic salt, seasoned salt, table salt, and sea salt. Canned and packaged gravies. Worcestershire sauce. Tartar sauce. Barbecue sauce. Teriyaki sauce. Soy sauce, including reduced-sodium. Steak sauce. Fish sauce. Oyster sauce. Cocktail sauce. Horseradish that you find on the shelf. Regular ketchup and mustard. Meat flavorings and tenderizers. Bouillon cubes. Hot sauce and Tabasco sauce. Premade or packaged marinades. Premade or packaged taco seasonings. Relishes. Regular salad dressings. Salsa. Potato and tortilla chips. Corn chips and puffs. Salted popcorn and pretzels. Canned or dried soups. Pizza. Frozen entrees and pot pies. Summary  Eating less sodium can help lower your blood pressure, reduce swelling, and protect your heart, liver, and kidneys.  Most people on this plan should limit their sodium intake to 1,500-2,000 mg (milligrams) of sodium each day.  Canned, boxed, and frozen foods are high in sodium. Restaurant foods, fast foods, and pizza are also very high in sodium. You also get sodium by adding salt to food.  Try to cook at home, eat more fresh fruits and vegetables, and eat less fast food, canned, processed, or prepared foods. This information is not intended to replace advice given to you by your health care provider. Make sure you discuss any questions you have with your health care provider. Document Released: 11/05/2001 Document Revised: 05/09/2016 Document Reviewed: 05/09/2016 Elsevier Interactive Patient Education  2019 [employer].       Diabetes Mellitus and Nutrition, Adult When you have diabetes (diabetes mellitus), it is very important to have healthy eating habits because your blood sugar (glucose) levels are greatly affected by what you eat and drink. Eating healthy foods in the appropriate amounts, at about the same times every day,  can help you:  Control your blood glucose.  Lower your risk of heart disease.  Improve your blood pressure.  Reach or maintain a healthy weight. Every person with diabetes is different, and each person has different needs for a meal plan. Your health care provider may recommend that you work with a diet and nutrition specialist (dietitian) to make a meal plan that is best for you. Your meal plan may vary depending on factors such as:  The calories you need.  The  medicines you take.  Your weight.  Your blood glucose, blood pressure, and cholesterol levels.  Your activity level.  Other health conditions you have, such as heart or kidney disease. How do carbohydrates affect me? Carbohydrates, also called carbs, affect your blood glucose level more than any other type of food. Eating carbs naturally raises the amount of glucose in your blood. Carb counting is a method for keeping track of how many carbs you eat. Counting carbs is important to keep your blood glucose at a healthy level, especially if you use insulin or take certain oral diabetes medicines. It is important to know how many carbs you can safely have in each meal. This is different for every person. Your dietitian can help you calculate how many carbs you should have at each meal and for each snack. Foods that contain carbs include:  Bread, cereal, rice, pasta, and crackers.  Potatoes and corn.  Peas, beans, and lentils.  Milk and yogurt.  Fruit and juice.  Desserts, such as cakes, cookies, ice cream, and candy. How does alcohol affect me? Alcohol can cause a sudden decrease in blood glucose (hypoglycemia), especially if you use insulin or take certain oral diabetes medicines. Hypoglycemia can be a life-threatening condition. Symptoms of hypoglycemia (sleepiness, dizziness, and confusion) are similar to symptoms of having too much alcohol. If your health care provider says that alcohol is safe for you, follow these  guidelines:  Limit alcohol intake to no more than 1 drink per day for nonpregnant women and 2 drinks per day for men. One drink equals 12 oz of beer, 5 oz of wine, or 1 oz of hard liquor.  Do not drink on an empty stomach.  Keep yourself hydrated with water, diet soda, or unsweetened iced tea.  Keep in mind that regular soda, juice, and other mixers may contain a lot of sugar and must be counted as carbs. What are tips for following this plan?  Reading food labels  Start by checking the serving size on the "Nutrition Facts" label of packaged foods and drinks. The amount of calories, carbs, fats, and other nutrients listed on the label is based on one serving of the item. Many items contain more than one serving per package.  Check the total grams (g) of carbs in one serving. You can calculate the number of servings of carbs in one serving by dividing the total carbs by 15. For example, if a food has 30 g of total carbs, it would be equal to 2 servings of carbs.  Check the number of grams (g) of saturated and trans fats in one serving. Choose foods that have low or no amount of these fats.  Check the number of milligrams (mg) of salt (sodium) in one serving. Most people should limit total sodium intake to less than 2,300 mg per day.  Always check the nutrition information of foods labeled as "low-fat" or "nonfat". These foods may be higher in added sugar or refined carbs and should be avoided.  Talk to your dietitian to identify your daily goals for nutrients listed on the label. Shopping  Avoid buying canned, premade, or processed foods. These foods tend to be high in fat, sodium, and added sugar.  Shop around the outside edge of the grocery store. This includes fresh fruits and vegetables, bulk grains, fresh meats, and fresh dairy. Cooking  Use low-heat cooking methods, such as baking, instead of high-heat cooking methods like deep frying.  Cook using healthy oils, such as olive,  canola, or sunflower oil.  Avoid cooking with butter, cream, or high-fat meats. Meal planning  Eat meals and snacks regularly, preferably at the same times every day. Avoid going long periods of time without eating.  Eat foods high in fiber, such as fresh fruits, vegetables, beans, and whole grains. Talk to your dietitian about how many servings of carbs you can eat at each meal.  Eat 4-6 ounces (oz) of lean protein each day, such as lean meat, chicken, fish, eggs, or tofu. One oz of lean protein is equal to: ? 1 oz of meat, chicken, or fish. ? 1 egg. ?  cup of tofu.  Eat some foods each day that contain healthy fats, such as avocado, nuts, seeds, and fish. Lifestyle  Check your blood glucose regularly.  Exercise regularly as told by your health care provider. This may include: ? 150 minutes of moderate-intensity or vigorous-intensity exercise each week. This could be brisk walking, biking, or water aerobics. ? Stretching and doing strength exercises, such as yoga or weightlifting, at least 2 times a week.  Take medicines as told by your health care provider.  Do not use any products that contain nicotine or tobacco, such as cigarettes and e-cigarettes. If you need help quitting, ask your health care provider.  Work with a Social worker or diabetes educator to identify strategies to manage stress and any emotional and social challenges. Questions to ask a health care provider  Do I need to meet with a diabetes educator?  Do I need to meet with a dietitian?  What number can I call if I have questions?  When are the best times to check my blood glucose? Where to find more information:  American Diabetes Association: diabetes.org  Academy of Nutrition and Dietetics: www.eatright.[employer] of Diabetes and Digestive and Kidney Diseases (NIH): [employer] Summary  A healthy meal plan will help you control your blood glucose and maintain a healthy  lifestyle.  Working with a diet and nutrition specialist (dietitian) can help you make a meal plan that is best for you.  Keep in mind that carbohydrates (carbs) and alcohol have immediate effects on your blood glucose levels. It is important to count carbs and to use alcohol carefully. This information is not intended to replace advice given to you by your health care provider. Make sure you discuss any questions you have with your health care provider. Document Released: 02/10/2005 Document Revised: 12/14/2016 Document Reviewed: 06/20/2016 Elsevier Interactive Patient Education  2019 [employer].

## 2018-09-10 NOTE — Assessment & Plan Note (Signed)
Assessment: Last BMI in the chart was 44 Maintained well on Breo Ellipta 200 Not using rescue inhaler Very sedentary lifestyle Patient denies wheezing at this time  Plan: Continue Breo inhaler Can use rescue inhaler as needed Aggressively work on increasing physical activity Follow recommended diets to lose weight Follow-up with our office in 3 months

## 2018-09-10 NOTE — Progress Notes (Signed)
Virtual Visit via Telephone Note  I connected with Amanda Cain on 09/10/18 at 11:30 AM EDT by telephone and verified that I am speaking with the correct person using two identifiers.   I discussed the limitations, risks, security and privacy concerns of performing an evaluation and management service by telephone and the availability of in person appointments. I also discussed with the patient that there may be a patient responsible charge related to this service. The patient expressed understanding and agreed to proceed.   History of Present Illness:  73 year old female never smoker followed in our office for Asthma  PMH: hypertension, coronary artery disease, type 2 diabetes, chronic kidney disease, CHF Smoker/ Smoking History: Never Smoker  Maintenance: Breo 200 Pt of: Dr. Vaughan Browner  Patient consented to consult via telephone: Yes People present and their role in pt care: Pt   Chief complaint: Asthma, dyspnea  73 year old female followed in our office for asthma.  Patient has significant comorbidities: Hypertension, chronic kidney disease, CHF.  Patient reports that since last office visit 4 weeks ago she has been doing okay.  She has been weighing herself daily.  She has followed up with cardiology.  Unfortunately the patient continues to be nonadherent to her diet.  She reports that her weight got down to 209 pounds last week which was great.  Unfortunately she has been eating lots of snacks and now her weight today is 215 pounds.  She denies orthopnea.  She does endorse worsened peripheral lower extremity edema as well as edema around her waist.  Patient denies rescue inhaler use.  Patient denies wheezing.   Observations/Objective:  09/10/2018 - Spo2 - 96 (2L)   09/10/2018 - 215lb 09/10/2018 - 214lb  Last week patient reports - 209lbs  Imaging  Chest x-ray 11/17/16-CHF with interstitial edema CT neck 02/19/16-lung apices appear clear CT abdomen 11/19/11-lung bases appear  clear. Chest x-ray 12/12/17- bibasilar atelectasis, basilar opacities.   Chest x-ray 03/31/2018- cardiomegaly, mild vascular congestion. 05/14/2018-VQ scan-pulmonary embolism absent, very low probability of PE 05/13/2018-chest x-ray-cardiomegaly without acute disease  PFTs 04/05/17-unable to complete FENO 02/27/17- unable to complete FENO 08/23/17-unable to complete  Cardiac Echo 11/17/16 LVEF 60 to 16%, grade 2 diastolic dysfunction, high ventricular filling pressures. - Left ventricle: The cavity size was normal. There was moderate  Echo 11/14/17- Mild LVH, LVEF 07-37%, grade 2 diastolic dysfunction  Right heart catheterization 12/15/2017 Moderately to severely elevated left and right heart filling pressures and pulmonary hypertension. Normal Fick cardiac output/index.  RA (mean): 20 mmHg RV (S/EDP): 68/20 mmHg PA (S/D, mean): 66/30 (42) mmHg PCWP (mean): 30 mmHg  Ao sat: 99% (vial pulseoximeter) PA sat: 63%  Fick CO: 5.4 L/min Fick CI: 2.7 L/min/m^2  Labs:  05/13/2018-influenza panel-negative for A and B 05/13/2018-respiratory virus panel-none detected 10/62/6948-NIOEV metabolic panel- sodium 035, potassium 5.5 (potential hemolysis), CH-91, CO2 29, glucose 108, BUN 37, creatinine 2.25, GFR 24 [id number]/93/[id number] metabolic panel- sodium 696, potassium 4.3, chloride 91, CO2 27, BUN 69, creatinine 2.52, GFR 21  08/09/2018 - BNP - 2064   05/12/2018-hospitalization-hypoxia/hyperkalemia Discharge date: 05/15/2018 Plan: Follow-up with PCP in 1 to 2 weeks, follow-up pulmonary 1 to 2 weeks, BMP and CBC in 1 week, questionable obstructive sleep apnea, d/c on 2L 02   No results found for: NITRICOXIDE   Assessment and Plan:  CHF (congestive heart failure) ([place]) Assessment: Patient reports her weight today is 215 pounds Patient reports that weight last week was 209 pounds Patient continues to be nonadherent to diet Patient  does report she is using a salt substitute now Patient  reporting lower extremity edema Patient reporting that she is had increased swelling around her waist She denies orthopnea Patient reports adherence to her fluid pills  Plan: Follow-up with cardiology if weights continue to increase You must follow a low-salt diet Referral to dietitian today for medical nutrition therapy Elevate legs Wear compression stockings Take diuretics as prescribed  Asthma Assessment: Last BMI in the chart was 44 Maintained well on Breo Ellipta 200 Not using rescue inhaler Very sedentary lifestyle Patient denies wheezing at this time  Plan: Continue Breo inhaler Can use rescue inhaler as needed Aggressively work on increasing physical activity Follow recommended diets to lose weight Follow-up with our office in 3 months  Follow Up Instructions:  Return in about 3 months (around 12/10/2018), or if symptoms worsen or fail to improve, for Follow up with Dr. Vaughan Browner, Follow up with Wyn Quaker FNP-C.    I discussed the assessment and treatment plan with the patient. The patient was provided an opportunity to ask questions and all were answered. The patient agreed with the plan and demonstrated an understanding of the instructions.   The patient was advised to call back or seek an in-person evaluation if the symptoms worsen or if the condition fails to improve as anticipated.  I provided 24 minutes of non-face-to-face time during this encounter.   Lauraine Rinne, NP

## 2018-09-10 NOTE — Assessment & Plan Note (Signed)
Assessment: Patient reports her weight today is 215 pounds Patient reports that weight last week was 209 pounds Patient continues to be nonadherent to diet Patient does report she is using a salt substitute now Patient reporting lower extremity edema Patient reporting that she is had increased swelling around her waist She denies orthopnea Patient reports adherence to her fluid pills  Plan: Follow-up with cardiology if weights continue to increase You must follow a low-salt diet Referral to dietitian today for medical nutrition therapy Elevate legs Wear compression stockings Take diuretics as prescribed

## 2018-10-02 ENCOUNTER — Telehealth: Payer: Self-pay | Admitting: Interventional Cardiology

## 2018-10-02 ENCOUNTER — Telehealth: Payer: Self-pay

## 2018-10-02 NOTE — Telephone Encounter (Signed)
Virtual Visit Pre-Appointment Phone Call TELEPHONE CALL NOTE  SNIGDHA HOWSER has been deemed a candidate for a follow-up tele-health visit to limit community exposure during the Covid-19 pandemic. I spoke with the patient via phone to ensure availability of phone/video source, confirm preferred email & phone number, and discuss instructions and expectations.  I reminded Lyzette Reinhardt Sachdev to be prepared with any vital sign and/or heart rhythm information that could potentially be obtained via home monitoring, at the time of her visit. I reminded TIANAH LONARDO to expect a phone call prior to her visit.  Patient agrees to consent below.  Cleon Gustin, RN 10/02/2018 1:59 PM    FULL LENGTH CONSENT FOR TELE-HEALTH VISIT   I hereby voluntarily request, consent and authorize [facility] and its employed or contracted physicians, physician assistants, nurse practitioners or other licensed health care professionals (the Practitioner), to provide me with telemedicine health care services (the "Services") as deemed necessary by the treating Practitioner. I acknowledge and consent to receive the Services by the Practitioner via telemedicine. I understand that the telemedicine visit will involve communicating with the Practitioner through live audiovisual communication technology and the disclosure of certain medical information by electronic transmission. I acknowledge that I have been given the opportunity to request an in-person assessment or other available alternative prior to the telemedicine visit and am voluntarily participating in the telemedicine visit.  I understand that I have the right to withhold or withdraw my consent to the use of telemedicine in the course of my care at any time, without affecting my right to future care or treatment, and that the Practitioner or I may terminate the telemedicine visit at any time. I understand that I have the right to inspect all information  obtained and/or recorded in the course of the telemedicine visit and may receive copies of available information for a reasonable fee.  I understand that some of the potential risks of receiving the Services via telemedicine include:  Marland Kitchen Delay or interruption in medical evaluation due to technological equipment failure or disruption; . Information transmitted may not be sufficient (e.g. poor resolution of images) to allow for appropriate medical decision making by the Practitioner; and/or  . In rare instances, security protocols could fail, causing a breach of personal health information.  Furthermore, I acknowledge that it is my responsibility to provide information about my medical history, conditions and care that is complete and accurate to the best of my ability. I acknowledge that Practitioner's advice, recommendations, and/or decision may be based on factors not within their control, such as incomplete or inaccurate data provided by me or distortions of diagnostic images or specimens that may result from electronic transmissions. I understand that the practice of medicine is not an exact science and that Practitioner makes no warranties or guarantees regarding treatment outcomes. I acknowledge that I will receive a copy of this consent concurrently upon execution via email to the email address I last provided but may also request a printed copy by calling the office of [facility] [place].    I understand that my insurance will be billed for this visit.   I have read or had this consent read to me. . I understand the contents of this consent, which adequately explains the benefits and risks of the Services being provided via telemedicine.  . I have been provided ample opportunity to ask questions regarding this consent and the Services and have had my questions answered to my satisfaction. Marland Kitchen I  give my informed consent for the services to be provided through the use of telemedicine in my medical care   By participating in this telemedicine visit I agree to the above.
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
## 2018-10-02 NOTE — Telephone Encounter (Signed)
Pt c/o swelling: STAT is pt has developed SOB within 24 hours  1) How much weight have you gained and in what time span?  Gained 3 lbs since yesterday    2) If swelling, where is the swelling located? Stomach, leg and feet  3) Are you currently taking a fluid pill? Furosemide  4) Are you currently SOB? no  5) Do you have a log of your daily weights (if so, list)? yes  6) Have you gained 3 pounds in a day or 5 pounds in a week? 3lbs in a day  7) Have you traveled recently? no

## 2018-10-02 NOTE — Telephone Encounter (Signed)
Returned call to patient who states that she has increase swelling in her abdomen and in her lower extremities. Denies increased SOB but states that she has a cough when she does not wear her O2. Patient states that she has gained 3 lbs since yesterday. She states that she is not eating a lot of salt. She has been taking torsemide 100 mg QD and K-dur 20 mEq BID. Arranged for virtual visit with Ermalinda Barrios, PA tomorrow.

## 2018-10-03 ENCOUNTER — Other Ambulatory Visit: Payer: Self-pay

## 2018-10-03 ENCOUNTER — Encounter: Payer: Self-pay | Admitting: Physician Assistant

## 2018-10-03 ENCOUNTER — Telehealth (INDEPENDENT_AMBULATORY_CARE_PROVIDER_SITE_OTHER): Payer: Medicare Other | Admitting: Physician Assistant

## 2018-10-03 ENCOUNTER — Telehealth: Payer: Self-pay

## 2018-10-03 VITALS — BP 179/82 | HR 82 | Ht 59.0 in | Wt 215.0 lb

## 2018-10-03 DIAGNOSIS — I5033 Acute on chronic diastolic (congestive) heart failure: Secondary | ICD-10-CM

## 2018-10-03 DIAGNOSIS — N183 Chronic kidney disease, stage 3 unspecified: Secondary | ICD-10-CM

## 2018-10-03 DIAGNOSIS — I10 Essential (primary) hypertension: Secondary | ICD-10-CM

## 2018-10-03 DIAGNOSIS — E785 Hyperlipidemia, unspecified: Secondary | ICD-10-CM

## 2018-10-03 NOTE — Patient Instructions (Addendum)
Medication Instructions:  Your physician recommends that you continue on your current medications as directed. Please refer to the Current Medication list given to you today.  If you need a refill on your cardiac medications before your next appointment, please call your pharmacy.   Lab work: We will have Home Health come out and draw labs (BMET and BNP) and perform heart failure assessment  If you have labs (blood work) drawn today and your tests are completely normal, you will receive your results only by: Marland Kitchen MyChart Message (if you have MyChart) OR . A paper copy in the mail If you have any lab test that is abnormal or we need to change your treatment, we will call you to review the results.  Testing/Procedures: None ordered  Follow-Up: . Follow up with Ermalinda Barrios, PA or Dr. Irish Lack in 2 months  Any Other Special Instructions Will Be Listed Below (If Applicable).  Follow up with your pulmonologist regarding your oxygen

## 2018-10-03 NOTE — Telephone Encounter (Signed)
[place] Visit Request  Agency Requested:  [facility] Contact:  Darnell Level, NP Phone #:  [phone] Fax #:  [phone]  Patient Information: Name:  Amanda Cain  DOB:  1946/03/26  MRN:  [id number]  Address:   [address] [place] [address]  Phone Numbers:   Home Phone [phone]  Mobile [phone]     Requesting Provider:   Ermalinda Barrios, PA  Diagnosis:   CHF, CKD  Reason for Visit:   F/U for CHF  Services Requested:  Vital Signs (BP, Pulse, O2, Weight)  Physical Exam  Labs:  BMET, BNP  # of Visits Needed/Frequency per Week: once

## 2018-10-05 ENCOUNTER — Other Ambulatory Visit: Payer: Self-pay | Admitting: Physician Assistant

## 2018-10-05 ENCOUNTER — Ambulatory Visit: Payer: Medicare Other | Admitting: Podiatry

## 2018-10-06 LAB — BASIC METABOLIC PANEL
BUN/Creatinine Ratio: 21 (ref 12–28)
BUN: 48 mg/dL — ABNORMAL HIGH (ref 8–27)
CO2: 27 mmol/L (ref 20–29)
Calcium: 9.5 mg/dL (ref 8.7–10.3)
Chloride: 94 mmol/L — ABNORMAL LOW (ref 96–106)
Creatinine, Ser: 2.29 mg/dL — ABNORMAL HIGH (ref 0.57–1.00)
GFR calc Af Amer: 24 mL/min/{1.73_m2} — ABNORMAL LOW (ref >59)
GFR calc non Af Amer: 20 mL/min/{1.73_m2} — ABNORMAL LOW (ref >59)
Glucose: 56 mg/dL — ABNORMAL LOW (ref 65–99)
Potassium: 4.6 mmol/L (ref 3.5–5.2)
Sodium: 137 mmol/L (ref 134–144)

## 2018-10-06 LAB — BRAIN NATRIURETIC PEPTIDE: BNP: 278.6 pg/mL — ABNORMAL HIGH (ref 0.0–100.0)

## 2018-10-20 ENCOUNTER — Other Ambulatory Visit: Payer: Self-pay | Admitting: Pulmonary Disease

## 2018-10-22 ENCOUNTER — Other Ambulatory Visit: Payer: Self-pay | Admitting: Pulmonary Disease

## 2018-10-24 ENCOUNTER — Telehealth: Payer: Self-pay | Admitting: Pulmonary Disease

## 2018-10-24 NOTE — Telephone Encounter (Signed)
LM for Amanda Cain need signed release of info.

## 2018-10-25 NOTE — Telephone Encounter (Signed)
Pt would like last lab sent to [place]. Done.

## 2018-10-25 NOTE — Telephone Encounter (Signed)
Called and spoke with [place]. Patient has not been at there office since 2019.  Jeani Hawking said she had spoke with Patient, and Patient was going to contact office for medical release of labs.  Nothing further at this time.

## 2018-10-29 ENCOUNTER — Ambulatory Visit: Payer: Medicare Other | Admitting: Dietician

## 2018-11-05 ENCOUNTER — Telehealth: Payer: Self-pay | Admitting: Interventional Cardiology

## 2018-11-05 ENCOUNTER — Telehealth: Payer: Self-pay | Admitting: Pulmonary Disease

## 2018-11-05 NOTE — Telephone Encounter (Signed)
No message needed °

## 2018-11-05 NOTE — Telephone Encounter (Signed)
Called and spoke with Erline Levine from [place] letting her know that last time pt had labwork performed was done at our office after her appt in March when she needed the labs and Erline Levine verbalized understanding. Nothing further needed.

## 2018-11-06 ENCOUNTER — Telehealth: Payer: Self-pay | Admitting: Physician Assistant

## 2018-11-06 NOTE — Telephone Encounter (Signed)
New Message:   Erline Levine from [facility], called. wanted to know what Home Health agent was used to draw pt's lab work/.

## 2018-11-06 NOTE — Telephone Encounter (Signed)
Left message for Amanda Cain at [facility] to call back.

## 2018-11-06 NOTE — Telephone Encounter (Signed)
Erline Levine returning call. Made her aware that we used Glory Buff, NP for the patient's labs. Contact info given.

## 2018-11-06 NOTE — Telephone Encounter (Signed)
Release of records was not received therefore lab results could not be faxed. Nothing further needed.

## 2018-11-07 ENCOUNTER — Ambulatory Visit: Payer: Medicare Other | Admitting: Registered"

## 2018-11-09 ENCOUNTER — Ambulatory Visit: Payer: Medicare Other | Admitting: Podiatry

## 2018-11-09 ENCOUNTER — Telehealth: Payer: Self-pay | Admitting: Podiatry

## 2018-11-09 NOTE — Telephone Encounter (Signed)
Called pt to let her know that none of our doctors go to homes. Told her to call doctors making house calls at [phone].

## 2018-11-09 NOTE — Telephone Encounter (Signed)
Do you make home visits? I don't want to come out and risk taking something home due to the current epidemic.

## 2018-11-13 ENCOUNTER — Ambulatory Visit: Payer: Medicare Other | Admitting: Podiatry

## 2018-11-15 ENCOUNTER — Telehealth: Payer: Self-pay | Admitting: Interventional Cardiology

## 2018-11-15 NOTE — Telephone Encounter (Signed)
Called and spoke to patient. Patient states that she has gained 4 lbs since Monday. She states that she has some swelling in her abdomen and slight swelling in her lower extremities. Patient states that she is not SOB. When I asked the patient about her salt intake she originally denied it, but then stated that she had been eating foods that she should not be including sausage biscuits. Told the patient she may take an extra torsemide x 1, but educated her on the importance of avoiding foods that are high in salt. Patient verbalized understanding and thanked me for the call.

## 2018-11-15 NOTE — Telephone Encounter (Signed)
Pt c/o swelling: STAT is pt has developed SOB within 24 hours  1) How much weight have you gained and in what time span?4lbs 2)  in 2 days  3) If swelling, where is the swelling located? stomach 4) Are you currently taking a fluid pill? Yes- she wants to know if she can take an extra fluid pill?  5) Are you currently SOB? yes  6) Do you have a log of your daily weights (if so, list)? yes  7) Have you gained 3 pounds in a day or 5 pounds in a week? 4lbs in 2 days  8) Have you traveled recently? no

## 2018-11-21 ENCOUNTER — Ambulatory Visit (INDEPENDENT_AMBULATORY_CARE_PROVIDER_SITE_OTHER): Payer: Medicare Other | Admitting: Podiatry

## 2018-11-21 ENCOUNTER — Telehealth: Payer: Self-pay | Admitting: Podiatry

## 2018-11-21 ENCOUNTER — Other Ambulatory Visit: Payer: Self-pay

## 2018-11-21 ENCOUNTER — Encounter: Payer: Self-pay | Admitting: Podiatry

## 2018-11-21 DIAGNOSIS — B351 Tinea unguium: Secondary | ICD-10-CM | POA: Diagnosis not present

## 2018-11-21 DIAGNOSIS — M79675 Pain in left toe(s): Secondary | ICD-10-CM | POA: Diagnosis not present

## 2018-11-21 DIAGNOSIS — M79674 Pain in right toe(s): Secondary | ICD-10-CM

## 2018-11-21 DIAGNOSIS — E118 Type 2 diabetes mellitus with unspecified complications: Secondary | ICD-10-CM

## 2018-11-21 NOTE — Progress Notes (Signed)
Complaint:  Visit Type: Patient returns to my office for continued preventative foot care services. Complaint: Patient states" my nails have grown long and thick and become painful to walk and wear shoes" Patient has been diagnosed with DM with no foot complications. The patient presents for preventative foot care services. No changes to ROS  Podiatric Exam: Vascular: dorsalis pedis and posterior tibial pulses are palpable bilateral. Capillary return is immediate. Temperature gradient is WNL. Skin turgor WNL  Sensorium: Normal Semmes Weinstein monofilament test. Normal tactile sensation bilaterally. Nail Exam: Pt has thick disfigured discolored nails with subungual debris noted bilateral entire nail hallux through fifth toenails Ulcer Exam: There is no evidence of ulcer or pre-ulcerative changes or infection. Orthopedic Exam: Muscle tone and strength are WNL. No limitations in general ROM. No crepitus or effusions noted. Foot type and digits show no abnormalities. HAV  B/L.Pes planus. Skin: No Porokeratosis. No infection or ulcers  Diagnosis:  Onychomycosis, , Pain in right toe, pain in left toes  Treatment & Plan Procedures and Treatment: Consent by patient was obtained for treatment procedures.   Debridement of mycotic and hypertrophic toenails, 1 through 5 bilateral and clearing of subungual debris. No ulceration, no infection noted.   Return Visit-Office Procedure: Patient instructed to return to the office for a follow up visit 3 months for continued evaluation and treatment.    Gardiner Barefoot DPM

## 2018-11-21 NOTE — Telephone Encounter (Signed)
Pt was seen in office today and was given an AVS and on the front page there is a note for her to change the way she is taking her medications. The pt has called wanting some clarification and wants to make sure she is supposed to continue taking her medication.   Please give patient a call

## 2018-11-22 ENCOUNTER — Other Ambulatory Visit: Payer: Self-pay | Admitting: Interventional Cardiology

## 2018-11-22 MED ORDER — POTASSIUM CHLORIDE CRYS ER 20 MEQ PO TBCR: 20.0000 meq | EXTENDED_RELEASE_TABLET | Freq: Two times a day (BID) | ORAL | 11 refills | Status: DC

## 2018-11-26 ENCOUNTER — Telehealth: Payer: Self-pay | Admitting: Interventional Cardiology

## 2018-11-26 ENCOUNTER — Telehealth: Payer: Self-pay | Admitting: Pulmonary Disease

## 2018-11-26 NOTE — Telephone Encounter (Signed)
Called and spoke to patient. Patient expresses frustration with having to wear her oxygen. She states that her SpO2 is always 98-99%. She is wanting to know if she can come off of the oxygen. Instructed for the patient to reach out to her pulmonologist for recommendation as we do not prescribe her O2.

## 2018-11-26 NOTE — Telephone Encounter (Signed)
Back in January 2020, pt's O2 dropped to 85% and this is when pt was placed on O2. Called and spoke with pt to discuss this with her and pt stated she had been placed on O2 while she was in hospital. Stated to pt that we could always walk her on room air at up coming OV 7/14 to see if she still needs the O2 or if we can discontinue the O2 and pt verbalized understanding. Nothing further needed.

## 2018-11-26 NOTE — Telephone Encounter (Signed)
New message:    Patient calling to see if she can come off her oxygen it has been 99 percent. Please call patient back.

## 2018-11-26 NOTE — Discharge Instructions (Signed)

## 2018-11-27 ENCOUNTER — Ambulatory Visit (HOSPITAL_COMMUNITY)
Admission: RE | Admit: 2018-11-27 | Discharge: 2018-11-27 | Disposition: A | Discharge status: Home / Self Care | Payer: Medicare Other | Source: Ambulatory Visit | Attending: Nephrology | Admitting: Nephrology

## 2018-11-27 ENCOUNTER — Other Ambulatory Visit: Payer: Self-pay

## 2018-11-27 VITALS — BP 133/71 | HR 72 | Temp 98.2°F | Resp 18

## 2018-11-27 DIAGNOSIS — N183 Chronic kidney disease, stage 3 unspecified: Secondary | ICD-10-CM

## 2018-11-27 LAB — POCT HEMOGLOBIN-HEMACUE: Hemoglobin: 10.5 g/dL — ABNORMAL LOW (ref 12.0–15.0)

## 2018-11-27 MED ORDER — EPOETIN ALFA-EPBX 10000 UNIT/ML IJ SOLN
10000.0000 [IU] | INTRAMUSCULAR | Status: DC
  Administered 2018-11-27: 10000 [IU] via SUBCUTANEOUS
  Filled 2018-11-27: qty 1
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
## 2018-12-10 ENCOUNTER — Encounter: Payer: Self-pay | Admitting: Interventional Cardiology

## 2018-12-10 ENCOUNTER — Other Ambulatory Visit: Payer: Self-pay

## 2018-12-10 ENCOUNTER — Telehealth (INDEPENDENT_AMBULATORY_CARE_PROVIDER_SITE_OTHER): Payer: Medicare Other | Admitting: Interventional Cardiology

## 2018-12-10 VITALS — BP 148/54 | HR 56 | Ht 59.0 in | Wt 209.0 lb

## 2018-12-10 DIAGNOSIS — I5032 Chronic diastolic (congestive) heart failure: Secondary | ICD-10-CM

## 2018-12-10 DIAGNOSIS — I130 Hypertensive heart and chronic kidney disease with heart failure and stage 1 through stage 4 chronic kidney disease, or unspecified chronic kidney disease: Secondary | ICD-10-CM | POA: Diagnosis not present

## 2018-12-10 DIAGNOSIS — N183 Chronic kidney disease, stage 3 unspecified: Secondary | ICD-10-CM

## 2018-12-10 DIAGNOSIS — E782 Mixed hyperlipidemia: Secondary | ICD-10-CM | POA: Diagnosis not present

## 2018-12-10 DIAGNOSIS — J9611 Chronic respiratory failure with hypoxia: Secondary | ICD-10-CM

## 2018-12-10 DIAGNOSIS — I110 Hypertensive heart disease with heart failure: Secondary | ICD-10-CM

## 2018-12-10 NOTE — Progress Notes (Signed)
@Patient  ID: Amanda Cain, female    DOB: August 30, 1945, 73 y.o.   MRN: [id number]  Chief Complaint  Patient presents with  . Follow-up    Asthma follow-up    Referring provider: Mayra Neer, MD  HPI:  73 year old female never smoker followed in our office for Asthma  PMH: hypertension, coronary artery disease, type 2 diabetes, chronic kidney disease, CHF Smoker/ Smoking History: Never Smoker  Maintenance: Breo 200 Pt of: Dr. Vaughan Browner  12/11/2018  - Visit   74 year old female never smoker followed in our office for asthma presenting today for follow-up visit.  Patient reports that her breathing has been stable.  She has completed follow-up with cardiology as well as nephrology.  Cardiology emphasized the importance of the patient as she needs to increase her daily physical activity as well as follow a low-sodium diet.  Patient was referred last office visit to a dietitian which she refused.  Patient is willing to reconsider this now.  Patient reports she had concerns about transportation due to issues with her aide.  She reports that she thinks she can work around the scheduling now to be able to attend.  She continues to be maintained on Brio Ellipta 200.  She uses her rescue inhaler twice daily.  Patient continues to lead a sedentary lifestyle.  She reports that she is committed to start walking again in her home.  Patient is also adamant that she would like to come off of the oxygen that she has to use with physical exertion.  At last office visit in March/2020 patient presented on room air and she was satting 84%.  Patient was walked in January/2020 as shown to require 2 L of O2.  Patient believes that she does not need oxygen anymore.  She presented to our office on 2 L today.     Tests:   Imaging  Chest x-ray 11/17/16-CHF with interstitial edema CT neck 02/19/16-lung apices appear clear CT abdomen 11/19/11-lung bases appear clear. Chest x-ray 12/12/17- bibasilar atelectasis,  basilar opacities.   Chest x-ray 03/31/2018- cardiomegaly, mild vascular congestion. 05/14/2018-VQ scan-pulmonary embolism absent, very low probability of PE 05/13/2018-chest x-ray-cardiomegaly without acute disease  PFTs 04/05/17-unable to complete FENO 02/27/17- unable to complete FENO 08/23/17-unable to complete  Cardiac Echo 11/17/16 LVEF 60 to 44%, grade 2 diastolic dysfunction, high ventricular filling pressures. - Left ventricle: The cavity size was normal. There was moderate  Echo 11/14/17- Mild LVH, LVEF 96-75%, grade 2 diastolic dysfunction  Right heart catheterization 12/15/2017 Moderately to severely elevated left and right heart filling pressures and pulmonary hypertension. Normal Fick cardiac output/index.  RA (mean): 20 mmHg RV (S/EDP): 68/20 mmHg PA (S/D, mean): 66/30 (42) mmHg PCWP (mean): 30 mmHg  Ao sat: 99% (vial pulseoximeter) PA sat: 63%  Fick CO: 5.4 L/min Fick CI: 2.7 L/min/m^2  Labs:  05/13/2018-influenza panel-negative for A and B 05/13/2018-respiratory virus panel-none detected [id number]/[id number] metabolic panel- sodium 701, potassium 5.5 (potential hemolysis), CH-91, CO2 29, glucose 108, BUN 37, creatinine 2.25, GFR 24 77/93/9030-SPQZR metabolic panel- sodium 007, potassium 4.3, chloride 91, CO2 27, BUN 69, creatinine 2.52, GFR 21  08/09/2018 - BNP - 2064  FENO:  No results found for: NITRICOXIDE  PFT: No flowsheet data found.  Imaging: No results found.    Specialty Problems      Pulmonary Problems   Acute respiratory failure with hypoxia (HCC)   Influenza A   Flu   CAP (community acquired pneumonia)   Community acquired pneumonia   Chronic  respiratory failure with hypoxia (HCC)   Asthma   Shortness of breath      Allergies  Allergen Reactions  . Aspirin Other (See Comments)    " Have an ulcer"    Immunization History  Administered Date(s) Administered  . Influenza Split 02/28/2016  . Influenza, High Dose Seasonal PF  03/12/2018  . Influenza,inj,Quad PF,6+ Mos 03/03/2015, 02/27/2017  . Pneumococcal Conjugate-13 02/28/2016   Patient due for Pneumovax 23  Past Medical History:  Diagnosis Date  . Anemia   . Anxiety    severe  . Arthritis   . Bronchitis    hx of  . CHF (congestive heart failure) ([place])   . Chronic kidney disease    "kidney disease stage 3"  . Depression   . Diabetes mellitus   . GERD (gastroesophageal reflux disease)   . Hx of gallstones   . Hypercholesteremia   . Hypertension   . Hypothyroidism     Tobacco History: Social History   Tobacco Use  Smoking Status Never Smoker  Smokeless Tobacco Never Used   Counseling given: Not Answered   Continue to not smoke  Outpatient Encounter Medications as of 12/11/2018  Medication Sig  . acetaminophen (TYLENOL) 500 MG tablet Take 500 mg by mouth daily as needed for mild pain.   Marland Kitchen albuterol (PROVENTIL HFA;VENTOLIN HFA) 108 (90 Base) MCG/ACT inhaler Inhale 2 puffs into the lungs every 4 (four) hours as needed for wheezing or shortness of breath.  Marland Kitchen atorvastatin (LIPITOR) 40 MG tablet Take 40 mg by mouth daily.   Marland Kitchen azelastine (ASTELIN) 0.1 % nasal spray Place 1 spray into both nostrils 2 (two) times daily. Use in each nostril as directed  . benzonatate (TESSALON) 100 MG capsule Take 100 mg by mouth 3 (three) times daily.  . Biotin 1000 MCG tablet Take 1,000 mcg by mouth daily.  Marland Kitchen BREO ELLIPTA 200-25 MCG/INH AEPB   . cholecalciferol (VITAMIN D) 1000 units tablet Take 1,000 Units by mouth daily.  Marland Kitchen diltiazem (CARDIZEM CD) 360 MG 24 hr capsule Take 1 capsule by mouth daily.  . EPOETIN ALFA IJ Inject 1 application as directed every 30 (thirty) days.  Marland Kitchen escitalopram (LEXAPRO) 20 MG tablet Take 20 mg by mouth at bedtime.  Marland Kitchen estradiol (ESTRACE) 0.5 MG tablet Take 0.5 mg by mouth every morning.   . ezetimibe (ZETIA) 10 MG tablet Take 10 mg by mouth daily.  . ferrous sulfate 325 (65 FE) MG tablet Take 325 mg by mouth daily with breakfast.   . glimepiride (AMARYL) 1 MG tablet Take 1 mg by mouth daily with breakfast.   . guaiFENesin (MUCINEX) 600 MG 12 hr tablet Take 600 mg by mouth 2 (two) times daily.  . hydrALAZINE (APRESOLINE) 25 MG tablet Take 1 tablet (25 mg total) by mouth every 8 (eight) hours.  Marland Kitchen levothyroxine (SYNTHROID, LEVOTHROID) 125 MCG tablet Take 250 mcg by mouth daily before breakfast.   . LORazepam (ATIVAN) 0.5 MG tablet   . metoprolol succinate (TOPROL-XL) 100 MG 24 hr tablet Take 100 mg by mouth every morning.   . montelukast (SINGULAIR) 10 MG tablet TAKE 1 TABLET(10 MG) BY MOUTH AT BEDTIME  . Multiple Vitamin (MULITIVITAMIN WITH MINERALS) TABS Take 1 tablet by mouth daily.  . ondansetron (ZOFRAN-ODT) 4 MG disintegrating tablet Take 4 mg by mouth every 8 (eight) hours as needed for nausea or vomiting.  . pantoprazole (PROTONIX) 40 MG tablet TAKE 1 TABLET(40 MG) BY MOUTH DAILY  . Polyethyl Glycol-Propyl Glycol (SYSTANE) 0.4-0.3 %  SOLN Apply 1 drop to eye daily as needed (for dry eyes).  . potassium chloride SA (K-DUR) 20 MEQ tablet Take 1 tablet (20 mEq total) by mouth 2 (two) times daily.  Marland Kitchen saccharomyces boulardii (FLORASTOR) 250 MG capsule Take 1 capsule (250 mg total) by mouth 2 (two) times daily.  . sitaGLIPtin (JANUVIA) 50 MG tablet Take 50 mg by mouth daily.  . sucralfate (CARAFATE) 1 G tablet Take 1 g by mouth 2 (two) times daily.  Marland Kitchen torsemide (DEMADEX) 100 MG tablet Take 100 mg by mouth daily.  . vitamin B-12 (CYANOCOBALAMIN) 1000 MCG tablet Take 1,000 mcg by mouth daily.  . vitamin C (ASCORBIC ACID) 500 MG tablet Take 500 mg by mouth daily.  . paliperidone (INVEGA) 3 MG 24 hr tablet Take 3 mg by mouth at bedtime.  . Promethazine-Codeine 6.25-10 MG/5ML SOLN Take 5 mLs by mouth at bedtime as needed (cough/cold).    No facility-administered encounter medications on file as of 12/11/2018.      Review of Systems  Review of Systems  Constitutional: Positive for fatigue. Negative for activity change and  fever.  HENT: Negative for sinus pressure, sinus pain and sore throat.   Respiratory: Positive for shortness of breath. Negative for cough and wheezing.   Cardiovascular: Positive for leg swelling. Negative for chest pain and palpitations.  Gastrointestinal: Positive for abdominal distention. Negative for diarrhea, nausea and vomiting.  Musculoskeletal: Negative for arthralgias.  Neurological: Negative for dizziness.  Psychiatric/Behavioral: Negative for sleep disturbance. The patient is not nervous/anxious.      Physical Exam  BP 122/76 (BP Location: Left Arm, Cuff Size: Normal)   Pulse 72   Temp 98.4 F (36.9 C) (Oral)   Ht 4\' 11"  (1.499 m)   Wt 217 lb 9.6 oz (98.7 kg)   SpO2 95%   BMI 43.95 kg/m   Wt Readings from Last 5 Encounters:  12/11/18 217 lb 9.6 oz (98.7 kg)  12/10/18 209 lb (94.8 kg)  10/03/18 215 lb (97.5 kg)  08/10/18 219 lb 6.4 oz (99.5 kg)  08/09/18 219 lb 4.8 oz (99.5 kg)     Physical Exam Vitals signs and nursing note reviewed. Exam conducted with a chaperone present.  Constitutional:      General: She is not in acute distress.    Appearance: Normal appearance. She is obese.     Comments: Chronically ill elderly female  HENT:     Head: Normocephalic and atraumatic.     Right Ear: Tympanic membrane, ear canal and external ear normal. There is no impacted cerumen.     Left Ear: Tympanic membrane, ear canal and external ear normal. There is no impacted cerumen.     Nose: Nose normal. No congestion.     Mouth/Throat:     Mouth: Mucous membranes are moist.     Pharynx: Oropharynx is clear.  Eyes:     Pupils: Pupils are equal, round, and reactive to light.  Neck:     Musculoskeletal: Normal range of motion.  Cardiovascular:     Rate and Rhythm: Normal rate and regular rhythm.     Pulses: Normal pulses.     Heart sounds: Normal heart sounds. No murmur.  Pulmonary:     Effort: Pulmonary effort is normal. No respiratory distress.     Breath sounds: No  decreased air movement. Decreased breath sounds (throughout exam ) present. No wheezing or rales.  Abdominal:     General: Abdomen is flat. Bowel sounds are normal. There is distension.  Palpations: Abdomen is soft.  Musculoskeletal:        General: Swelling (abdominal ) present.     Right lower leg: Edema (2-3+) present.     Left lower leg: Edema (2-3+) present.  Skin:    General: Skin is warm and dry.     Capillary Refill: Capillary refill takes less than 2 seconds.  Neurological:     General: No focal deficit present.     Mental Status: She is alert and oriented to person, place, and time. Mental status is at baseline.     Gait: Gait normal.  Psychiatric:        Mood and Affect: Mood normal.        Behavior: Behavior normal.        Thought Content: Thought content normal.        Judgment: Judgment normal.      Lab Results:  CBC    Component Value Date/Time   WBC 9.8 08/09/2018 1157   RBC 3.10 (L) 08/09/2018 1157   HGB 10.5 (L) 11/27/2018 1001   HCT 26.2 Repeated and verified X2. (L) 08/09/2018 1157   PLT 316.0 08/09/2018 1157   MCV 84.4 08/09/2018 1157   MCH 27.2 06/07/2018 0803   MCHC 32.9 08/09/2018 1157   RDW 16.8 (H) 08/09/2018 1157   LYMPHSABS 0.9 08/09/2018 1157   MONOABS 0.9 08/09/2018 1157   EOSABS 0.1 08/09/2018 1157   BASOSABS 0.0 08/09/2018 1157    BMET    Component Value Date/Time   NA 137 10/05/2018 0930   K 4.6 10/05/2018 0930   CL 94 (L) 10/05/2018 0930   CO2 27 10/05/2018 0930   GLUCOSE 56 (L) 10/05/2018 0930   GLUCOSE 102 (H) 08/09/2018 1157   BUN 48 (H) 10/05/2018 0930   CREATININE 2.29 (H) 10/05/2018 0930   CALCIUM 9.5 10/05/2018 0930   GFRNONAA 20 (L) 10/05/2018 0930   GFRAA 24 (L) 10/05/2018 0930    BNP    Component Value Date/Time   BNP 278.6 (H) 10/05/2018 0930   BNP 172.1 (H) 05/12/2018 1457    ProBNP    Component Value Date/Time   PROBNP 2,064 (H) 08/09/2018 1157   PROBNP 130.6 (H) 11/19/2011 2155       Assessment & Plan:   CHF (congestive heart failure) (HCC) Assessment: Patient weight on scale today is 217 Patient reported weight on yesterday's tele-visit was 209 Patient continues to be nonadherent to diet, refused recent referral to dietitian Patient having 2-3+ lower extremity edema Patient reporting orthopnea Patient with abdominal swelling Patient reports adherence to fluid pills  Plan: Continue follow-up with cardiology You must follow a low-salt diet Referral to dietitian again today for medical nutrition therapy Elevate legs Wear compression socks Take diuretics as prescribed Continuing heart failure program from insurance company Walk today to assess oxygen needs   Chronic respiratory failure with hypoxia ([place]) Assessment: Walk in January/22 revealed patient needs 2 L via nasal cannula with exertion Patient adamant on arrival to office today that she does not need oxygen anymore Walk today in office reveals patient still requires 2 L with physical exertion  Plan: Continue oxygen therapy as prescribed Wear 2 L of O2 when physically exerting herself  Asthma Assessment: Last BMI in chart was 43.95 Maintained on Breo Ellipta Using rescue inhaler twice daily Very sedentary lifestyle Breath sounds clear to auscultation today, diminished throughout exam  Plan: Continue Breo Ellipta inhaler Continue risk inhaler as needed Increase physical activity Work to lose weight and reduce  BMI Goal should be to lose between 10 to 15% of your current body weight  Type 2 diabetes mellitus with complication, without long-term current use of insulin ([place]) Plan: Referral to nutritionist  Morbid obesity ([place]) Assessment: BMI 43.95 Very deconditioned elderly female Leading sedentary lifestyle  Plan: Start daily physical activity Use 2 L of O2 with exertion Follow-up in 3 months    Return in about 3 months (around 03/13/2019), or if symptoms worsen or fail to  improve, for Follow up with Dr. Vaughan Browner.   Lauraine Rinne, NP 12/11/2018   This appointment was 29 minutes long with over 50% of the time in direct face-to-face patient care, assessment, plan of care, and follow-up.

## 2018-12-10 NOTE — Patient Instructions (Signed)
Medication Instructions:  Your physician recommends that you continue on your current medications as directed. Please refer to the Current Medication list given to you today.  If you need a refill on your cardiac medications before your next appointment, please call your pharmacy.   Lab work: None Ordered  If you have labs (blood work) drawn today and your tests are completely normal, you will receive your results only by:  [place] (if you have MyChart) OR  A paper copy in the mail If you have any lab test that is abnormal or we need to change your treatment, we will call you to review the results.  Testing/Procedures: None ordered  Follow-Up: At [facility], you and your health needs are our priority.  As part of our continuing mission to provide you with exceptional heart care, we have created designated Provider Care Teams.  These Care Teams include your primary Cardiologist (physician) and Advanced Practice Providers (APPs -  Physician Assistants and Nurse Practitioners) who all work together to provide you with the care you need, when you need it.  You will need a follow up appointment in 6 months for VIDEO Visit.  Please call our office 2 months in advance to schedule this appointment.  You may see Casandra Doffing, MD or one of the following Advanced Practice Providers on your designated Care Team:    Lyda Jester, PA-C  Dayna Dunn, PA-C  Ermalinda Barrios, PA-C  Any Other Special Instructions Will Be Listed Below (If Applicable).  Your provider recommends that you maintain 150 minutes per week of moderate aerobic activity.   Low-Sodium Eating Plan Sodium, which is an element that makes up salt, helps you maintain a healthy balance of fluids in your body. Too much sodium can increase your blood pressure and cause fluid and waste to be held in your body. Your health care provider or dietitian may recommend following this plan if you have high blood pressure  (hypertension), kidney disease, liver disease, or heart failure. Eating less sodium can help lower your blood pressure, reduce swelling, and protect your heart, liver, and kidneys. What are tips for following this plan? General guidelines Most people on this plan should limit their sodium intake to 1,500-2,000 mg (milligrams) of sodium each day. Reading food labels  The Nutrition Facts label lists the amount of sodium in one serving of the food. If you eat more than one serving, you must multiply the listed amount of sodium by the number of servings. Choose foods with less than 140 mg of sodium per serving. Avoid foods with 300 mg of sodium or more per serving. Shopping Look for lower-sodium products, often labeled as "low-sodium" or "no salt added." Always check the sodium content even if foods are labeled as "unsalted" or "no salt added". Buy fresh foods. Avoid canned foods and premade or frozen meals. Avoid canned, cured, or processed meats Buy breads that have less than 80 mg of sodium per slice. Cooking Eat more home-cooked food and less restaurant, buffet, and fast food. Avoid adding salt when cooking. Use salt-free seasonings or herbs instead of table salt or sea salt. Check with your health care provider or pharmacist before using salt substitutes. Cook with plant-based oils, such as canola, sunflower, or olive oil. Meal planning When eating at a restaurant, ask that your food be prepared with less salt or no salt, if possible. Avoid foods that contain MSG (monosodium glutamate). MSG is sometimes added to [place] food, bouillon, and some canned foods. What  foods are recommended? The items listed may not be a complete list. Talk with your dietitian about what dietary choices are best for you. Grains Low-sodium cereals, including oats, puffed wheat and rice, and shredded wheat. Low-sodium crackers. Unsalted rice. Unsalted pasta. Low-sodium bread. Whole-grain breads and whole-grain  pasta. Vegetables Fresh or frozen vegetables. "No salt added" canned vegetables. "No salt added" tomato sauce and paste. Low-sodium or reduced-sodium tomato and vegetable juice. Fruits Fresh, frozen, or canned fruit. Fruit juice. Meats and other protein foods Fresh or frozen (no salt added) meat, poultry, seafood, and fish. Low-sodium canned tuna and salmon. Unsalted nuts. Dried peas, beans, and lentils without added salt. Unsalted canned beans. Eggs. Unsalted nut butters. Dairy Milk. Soy milk. Cheese that is naturally low in sodium, such as ricotta cheese, fresh mozzarella, or Swiss cheese Low-sodium or reduced-sodium cheese. Cream cheese. Yogurt. Fats and oils Unsalted butter. Unsalted margarine with no trans fat. Vegetable oils such as canola or olive oils. Seasonings and other foods Fresh and dried herbs and spices. Salt-free seasonings. Low-sodium mustard and ketchup. Sodium-free salad dressing. Sodium-free light mayonnaise. Fresh or refrigerated horseradish. Lemon juice. Vinegar. Homemade, reduced-sodium, or low-sodium soups. Unsalted popcorn and pretzels. Low-salt or salt-free chips. What foods are not recommended? The items listed may not be a complete list. Talk with your dietitian about what dietary choices are best for you. Grains Instant hot cereals. Bread stuffing, pancake, and biscuit mixes. Croutons. Seasoned rice or pasta mixes. Noodle soup cups. Boxed or frozen macaroni and cheese. Regular salted crackers. Self-rising flour. Vegetables Sauerkraut, pickled vegetables, and relishes. Olives. [place] fries. Onion rings. Regular canned vegetables (not low-sodium or reduced-sodium). Regular canned tomato sauce and paste (not low-sodium or reduced-sodium). Regular tomato and vegetable juice (not low-sodium or reduced-sodium). Frozen vegetables in sauces. Meats and other protein foods Meat or fish that is salted, canned, smoked, spiced, or pickled. Bacon, ham, sausage, hotdogs, corned  beef, chipped beef, packaged lunch meats, salt pork, jerky, pickled herring, anchovies, regular canned tuna, sardines, salted nuts. Dairy Processed cheese and cheese spreads. Cheese curds. Blue cheese. Feta cheese. String cheese. Regular cottage cheese. Buttermilk. Canned milk. Fats and oils Salted butter. Regular margarine. Ghee. Bacon fat. Seasonings and other foods Onion salt, garlic salt, seasoned salt, table salt, and sea salt. Canned and packaged gravies. Worcestershire sauce. Tartar sauce. Barbecue sauce. Teriyaki sauce. Soy sauce, including reduced-sodium. Steak sauce. Fish sauce. Oyster sauce. Cocktail sauce. Horseradish that you find on the shelf. Regular ketchup and mustard. Meat flavorings and tenderizers. Bouillon cubes. Hot sauce and Tabasco sauce. Premade or packaged marinades. Premade or packaged taco seasonings. Relishes. Regular salad dressings. Salsa. Potato and tortilla chips. Corn chips and puffs. Salted popcorn and pretzels. Canned or dried soups. Pizza. Frozen entrees and pot pies. Summary Eating less sodium can help lower your blood pressure, reduce swelling, and protect your heart, liver, and kidneys. Most people on this plan should limit their sodium intake to 1,500-2,000 mg (milligrams) of sodium each day. Canned, boxed, and frozen foods are high in sodium. Restaurant foods, fast foods, and pizza are also very high in sodium. You also get sodium by adding salt to food. Try to cook at home, eat more fresh fruits and vegetables, and eat less fast food, canned, processed, or prepared foods. This information is not intended to replace advice given to you by your health care provider. Make sure you discuss any questions you have with your health care provider. Document Released: 11/05/2001 Document Revised: 04/28/2017 Document Reviewed: 05/09/2016 Elsevier Patient  Education  [employer].

## 2018-12-11 ENCOUNTER — Other Ambulatory Visit: Payer: Self-pay

## 2018-12-11 ENCOUNTER — Encounter: Payer: Self-pay | Admitting: Pulmonary Disease

## 2018-12-11 ENCOUNTER — Ambulatory Visit (INDEPENDENT_AMBULATORY_CARE_PROVIDER_SITE_OTHER): Payer: Medicare Other | Admitting: Pulmonary Disease

## 2018-12-11 VITALS — BP 122/76 | HR 72 | Temp 98.4°F | Ht 59.0 in | Wt 217.6 lb

## 2018-12-11 DIAGNOSIS — I5032 Chronic diastolic (congestive) heart failure: Secondary | ICD-10-CM

## 2018-12-11 DIAGNOSIS — J4540 Moderate persistent asthma, uncomplicated: Secondary | ICD-10-CM | POA: Diagnosis not present

## 2018-12-11 DIAGNOSIS — E118 Type 2 diabetes mellitus with unspecified complications: Secondary | ICD-10-CM

## 2018-12-11 DIAGNOSIS — J9611 Chronic respiratory failure with hypoxia: Secondary | ICD-10-CM

## 2018-12-11 DIAGNOSIS — Z23 Encounter for immunization: Secondary | ICD-10-CM | POA: Diagnosis not present

## 2018-12-11 NOTE — Assessment & Plan Note (Addendum)
Assessment: Last BMI in chart was 43.95 Maintained on Breo Ellipta Using rescue inhaler twice daily Very sedentary lifestyle Breath sounds clear to auscultation today, diminished throughout exam  Plan: Continue Breo Ellipta inhaler Continue risk inhaler as needed Increase physical activity Work to lose weight and reduce BMI Goal should be to lose between 10 to 15% of your current body weight

## 2018-12-11 NOTE — Patient Instructions (Addendum)
Walk in office today >>> He still required 2 L of O2 with physical exertion  We will refer back to dietitian and you will coordinate a ride in order to attend  Pneumovax 23 today  Breo Ellipta 200 >>>Take 1 puff daily in the morning right when you wake up >>>Rinse your mouth out after use >>>This is a daily maintenance inhaler, NOTa rescue inhaler >>>Contact our office if you are having difficulties affording or obtaining this medication >>>It is important for you to be able to take this daily and not miss any doses  Only use your albuterol as a rescue medication to be used if you can't catch your breath by resting or doing a relaxed purse lip breathing pattern.  - The less you use it, the better it will work when you need it. - Ok to use up to 2 puffs every 4 hours if you must but call for immediate appointment if use goes up over your usual need - Don't leave home without it !! (think of it like the spare tire for your car)  Continue oxygen therapy as prescribed- 2L with exertion >>>maintain oxygen saturations greater than 88 percent  >>>if unable to maintain oxygen saturations please contact the office  >>>do not smoke with oxygen  >>>can use nasal saline gel or nasal saline rinses to moisturize nose if oxygen causes dryness   Increase physical activity every day  Walk during commercial breaks when watching TV  Can start doing Silver sneakers  Follow up with Kidney Doctor   Follow-up with cardiology >>>Weigh yourself daily to monitor for fluid >>>Follow low-sodium diet  Patient Education for Congestive Heart Failure  Do the following things EVERY DAY:  1. Weigh yourself EVERY morning after you go to the bathroom but before you eat or drink anything. Write this number down in a weight log/diary.   2. Take your medicines as prescribed. If you have concerns about your medications, please call us before you stop taking them.   3. Eat low salt  foods-Limit salt (sodium) to 2000 mg per day. This will help prevent your body from holding onto fluid. Read food labels as many processed foods have a lot of sodium, especially canned goods and prepackaged meats. If you would like some assistance choosing low sodium foods, we would be happy to set you up with a nutritionist.  4. Stay as active as you can everyday. Staying active will give you more energy and make your muscles stronger. Start with 5 minutes at a time and work your way up to 30 minutes a day. Break up your activities--do some in the morning and some in the afternoon. Start with 3 days per week and work your way up to 5 days as you can. If you have chest pain, feel short of breath, dizzy, or lightheaded, STOP. If you don't feel better after a short rest, call 911. If you do feel better, call the office to let us know you have symptoms with exercise.  5. Limit all fluids for the day to less than 2 liters. Fluid includes all drinks, coffee, juice, ice chips, soup, jello, and all other liquids.    Return in about 3 months (around 03/13/2019), or if symptoms worsen or fail to improve, for Follow up with Dr. Vaughan Browner.   Coronavirus (COVID-19) Are you at risk?  Are you at risk for the Coronavirus (COVID-19)?  To be considered HIGH RISK for Coronavirus (COVID-19), you have to meet the following criteria:  .  Traveled to [place], [place], [place], [place] or [place]; or in the [place] to [place], [place], [place], or [place]; and have fever, cough, and shortness of breath within the last 2 weeks of travel OR . Been in close contact with a person diagnosed with COVID-19 within the last 2 weeks and have fever, cough, and shortness of breath . IF YOU DO NOT MEET THESE CRITERIA, YOU ARE CONSIDERED LOW RISK FOR COVID-19.  What to do if you are HIGH RISK for COVID-19?  Marland Kitchen If you are having a medical emergency, call 911. . Seek medical care right away. Before you go to a  doctor's office, urgent care or emergency department, call ahead and tell them about your recent travel, contact with someone diagnosed with COVID-19, and your symptoms. You should receive instructions from your physician's office regarding next steps of care.  . When you arrive at healthcare provider, tell the healthcare staff immediately you have returned from visiting [place], [place], [place], [place] or [place]; or traveled in the [place] to [place], [place], [place], or [place]; in the last two weeks or you have been in close contact with a person diagnosed with COVID-19 in the last 2 weeks.   . Tell the health care staff about your symptoms: fever, cough and shortness of breath. . After you have been seen by a medical provider, you will be either: o Tested for (COVID-19) and discharged home on quarantine except to seek medical care if symptoms worsen, and asked to  - Stay home and avoid contact with others until you get your results (4-5 days)  - Avoid travel on public transportation if possible (such as bus, train, or airplane) or o Sent to the Emergency Department by EMS for evaluation, COVID-19 testing, and possible admission depending on your condition and test results.  What to do if you are LOW RISK for COVID-19?  Reduce your risk of any infection by using the same precautions used for avoiding the common cold or flu:  Marland Kitchen Wash your hands often with soap and warm water for at least 20 seconds.  If soap and water are not readily available, use an alcohol-based hand sanitizer with at least 60% alcohol.  . If coughing or sneezing, cover your mouth and nose by coughing or sneezing into the elbow areas of your shirt or coat, into a tissue or into your sleeve (not your hands). . Avoid shaking hands with others and consider head nods or verbal greetings only. . Avoid touching your eyes, nose, or mouth with unwashed hands.  . Avoid close contact with people who are sick. . Avoid  places or events with large numbers of people in one location, like concerts or sporting events. . Carefully consider travel plans you have or are making. . If you are planning any travel outside or inside the [place], visit the CDC's Travelers' Health webpage for the latest health notices. . If you have some symptoms but not all symptoms, continue to monitor at home and seek medical attention if your symptoms worsen. . If you are having a medical emergency, call 911.   [place] / e-Visit: [email or web address]         [facility]: [facility]: [phone]                   [facility]: [phone]  It is flu season:   >>> Best ways to protect herself from the flu: Receive the yearly flu vaccine, practice good hand hygiene washing with soap and also using hand sanitizer when available, eat a nutritious meals, get adequate rest, hydrate appropriately   Please contact the office if your symptoms worsen or you have concerns that you are not improving.   Thank you for choosing [facility] for your healthcare, and for allowing [place] to partner with you on your healthcare journey. I am thankful to be able to provide care to you today.   Wyn Quaker FNP-C   Exercises To Do While Sitting  Exercises that you do while sitting (chair exercises) can give you many of the same benefits as full exercise. Benefits include strengthening your heart, burning calories, and keeping muscles and joints healthy. Exercise can also improve your mood and help with depression and anxiety. You may benefit from chair exercises if you are unable to do standing exercises because of:  Diabetic foot pain.  Obesity.  Illness.  Arthritis.  Recovery from surgery or injury.  Breathing problems.  Balance problems.  Another type of  disability. Before starting chair exercises, check with your health care provider or a physical therapist to find out how much exercise you can tolerate and which exercises are safe for you. If your health care provider approves:  Start out slowly and build up over time. Aim to work up to about 10-20 minutes for each exercise session.  Make exercise part of your daily routine.  Drink water when you exercise. Do not wait until you are thirsty. Drink every 10-15 minutes.  Stop exercising right away if you have pain, nausea, shortness of breath, or dizziness.  If you are exercising in a wheelchair, make sure to lock the wheels.  Ask your health care provider whether you can do tai chi or yoga. Many positions in these mind-body exercises can be modified to do while seated. Warm-up Before starting other exercises: 1. Sit up as straight as you can. Have your knees bent at 90 degrees, which is the shape of the capital letter "L." Keep your feet flat on the floor. 2. Sit at the front edge of your chair, if you can. 3. Pull in (tighten) the muscles in your abdomen and stretch your spine and neck as straight as you can. Hold this position for a few minutes. 4. Breathe in and out evenly. Try to concentrate on your breathing, and relax your mind. Stretching Exercise A: Arm stretch 1. Hold your arms out straight in front of your body. 2. Bend your hands at the wrist with your fingers pointing up, as if signaling someone to stop. Notice the slight tension in your forearms as you hold the position. 3. Keeping your arms out and your hands bent, rotate your hands outward as far as you can and hold this stretch. Aim to have your thumbs pointing up and your pinkie fingers pointing down. Slowly repeat arm stretches for one minute as tolerated. Exercise B: Leg stretch 1. If you can move your legs, try to "draw" letters on the floor with the toes of your foot. Write your name with one foot. 2. Write your name  with the toes of your other foot. Slowly repeat the movements for one minute as tolerated. Exercise C: Reach for the sky 1. Reach your hands as far over your head as you can to stretch your spine. 2. Move your hands and arms as if you  are climbing a rope. Slowly repeat the movements for one minute as tolerated. Range of motion exercises Exercise A: Shoulder roll 1. Let your arms hang loosely at your sides. 2. Lift just your shoulders up toward your ears, then let them relax back down. 3. When your shoulders feel loose, rotate your shoulders in backward and forward circles. Do shoulder rolls slowly for one minute as tolerated. Exercise B: March in place 1. As if you are marching, pump your arms and lift your legs up and down. Lift your knees as high as you can. ? If you are unable to lift your knees, just pump your arms and move your ankles and feet up and down. March in place for one minute as tolerated. Exercise C: Seated jumping jacks 1. Let your arms hang down straight. 2. Keeping your arms straight, lift them up over your head. Aim to point your fingers to the ceiling. 3. While you lift your arms, straighten your legs and slide your heels along the floor to your sides, as wide as you can. 4. As you bring your arms back down to your sides, slide your legs back together. ? If you are unable to use your legs, just move your arms. Slowly repeat seated jumping jacks for one minute as tolerated. Strengthening exercises Exercise A: Shoulder squeeze 1. Hold your arms straight out from your body to your sides, with your elbows bent and your fists pointed at the ceiling. 2. Keeping your arms in the bent position, move them forward so your elbows and forearms meet in front of your face. 3. Open your arms back out as wide as you can with your elbows still bent, until you feel your shoulder blades squeezing together. Hold for 5 seconds. Slowly repeat the movements forward and backward for one  minute as tolerated. Contact a health care provider if you:  Had to stop exercising due to any of the following: ? Pain. ? Nausea. ? Shortness of breath. ? Dizziness. ? Fatigue.  Have significant pain or soreness after exercising. Get help right away if you have:  Chest pain.  Difficulty breathing. These symptoms may represent a serious problem that is an emergency. Do not wait to see if the symptoms will go away. Get medical help right away. Call your local emergency services (911 in the [place] Do not drive yourself to the hospital. This information is not intended to replace advice given to you by your health care provider. Make sure you discuss any questions you have with your health care provider. Document Released: 03/29/2017 Document Revised: 09/06/2018 Document Reviewed: 03/29/2017 Elsevier Patient Education  [place] Exercise  The sit-to-stand exercise (also known as the chair stand or chair rise exercise) strengthens your lower body and helps you maintain or improve your mobility and independence. The goal is to do the sit-to-stand exercise without using your hands. This will be easier as you become stronger. You should always talk with your health care provider before starting any exercise program, especially if you have had recent surgery. Do the exercise exactly as told by your health care provider and adjust it as directed. It is normal to feel mild stretching, pulling, tightness, or discomfort as you do this exercise, but you should stop right away if you feel sudden pain or your pain gets worse. Do not begin doing this exercise until told by your health care provider. What the sit-to-stand exercise does The sit-to-stand exercise helps to strengthen the muscles  in your thighs and the muscles in the center of your body that give you stability (core muscles). This exercise is especially helpful if:  You have had knee or hip surgery.  You have  trouble getting up from a chair, out of a car, or off the toilet. How to do the sit-to-stand exercise 1. Sit toward the front edge of a sturdy chair without armrests. Your knees should be bent and your feet should be flat on the floor and shoulder-width apart. 2. Place your hands lightly on each side of the seat. Keep your back and neck as straight as possible, with your chest slightly forward. 3. Breathe in slowly. Lean forward and slightly shift your weight to the front of your feet. 4. Breathe out as you slowly stand up. Use your hands as little as possible. 5. Stand and pause for a full breath in and out. 6. Breathe in as you sit down slowly. Tighten your core and abdominal muscles to control your lowering as much as possible. 7. Breathe out slowly. 8. Do this exercise 10-15 times. If needed, do it fewer times until you build up strength. 9. Rest for 1 minute, then do another set of 10-15 repetitions. To change the difficulty of the sit-to-stand exercise  If the exercise is too difficult, use a chair with sturdy armrests, and push off the armrests to help you come to the standing position. You can also use the armrests to help slowly lower yourself back to sitting. As this gets easier, try to use your arms less. You can also place a firm cushion or pillow on the chair to make the surface higher.  If this exercise is too easy, do not use your arms to help raise or lower yourself. You can also wear a weighted vest, use hand weights, increase your repetitions, or try a lower chair. General tips  You may feel tired when starting an exercise routine. This is normal.  You may have muscle soreness that lasts a few days. This is normal. As you get stronger, you may not feel muscle soreness.  Use smooth, steady movements.  Do not  hold your breath during strength exercises. This can cause unsafe changes in your blood pressure.  Breathe in slowly through your nose, and breathe out slowly through  your mouth. Summary  Strengthening your lower body is an important step to help you move safely and independently.  The sit-to-stand exercise helps strengthen the muscles in your thighs and core.  You should always talk with your health care provider before starting any exercise program, especially if you have had recent surgery. This information is not intended to replace advice given to you by your health care provider. Make sure you discuss any questions you have with your health care provider. Document Released: 07/07/2016 Document Revised: 03/14/2018 Document Reviewed: 07/07/2016 Elsevier Patient Education  2020 [employer].   How to Increase Your Level of Physical Activity  Getting regular physical activity is important for your overall health and well-being. Most people do not get enough exercise. There are easy ways to increase your level of physical activity, even if you have not been very active in the past or you are just starting out. Why is physical activity important? Physical activity has many short-term and long-term health benefits. Regular exercise can:  Help you lose weight or maintain a healthy weight.  Strengthen your muscles and bones.  Boost your mood and improve self-esteem.  Reduce your risk of certain long-term (chronic)  diseases, like heart disease, cancer, and diabetes.  Help you stay capable of walking and moving around (mobile) as you age.  Prevent accidents, such as falls, as you age.  Increase life expectancy. What are the benefits of being physically active on a regular basis? In addition to improving your physical health, being physically active on most days of the week can help you in ways that you may not expect. Benefits of regular physical activity may include:  Feeling good about your body.  Being able to move around more easily and for longer periods of time without getting tired (increased stamina).  Finding new sources of fun and  enjoyment.  Meeting new people who share a common interest.  Being able to fight off illness better (enhanced immunity).  Being able to sleep better. What can happen if I am not physically active on a regular basis? Not getting enough physical activity can lead to an unhealthy lifestyle and future health problems. This can increase your chances of:  Becoming overweight or obese.  Becoming sick.  Developing chronic illnesses, like heart disease or diabetes.  Having mental health problems, like depression or anxiety.  Having sleep problems.  Having trouble walking or getting yourself around (reduced mobility).  Injuring yourself in a fall as you get older. What steps can I take to be more physically active?   Check with your health care provider about how to get started. Ask your health care provider what activities are safe for you.  Start out slowly. Walking or doing some simple chair exercises is a good place to start, especially if you have not been active before or for a long time.  Try to find activities that you enjoy. You are more likely to commit to an exercise routine if it does not feel like a chore.  If you have bone or joint problems, choose low-impact exercises, like walking or swimming.  Include physical activity in your everyday routine.  Invite friends or family members to exercise with you. This also will help you commit to your workout plan.  Set goals that you can work toward.  Aim for at least 150 minutes of moderate-intensity exercise each week. Examples of moderate-intensity exercise include walking or riding a bike. Where to find more information  Centers for Disease Control and Prevention: BowlingGrip.is  President's Council on Fitness, Sports & Nutrition www.http://villegas.org/  ChooseMy[employer]ate: WirelessMort[employer]ages.dk Contact a health care provider if:  You have headaches, muscle aches, or joint  pain.  You feel dizzy or light-headed while exercising.  You faint.  You have chest pain while exercising. Summary  Exercise benefits your mind and body at any age, even if you are just starting out.  If you have a chronic illness or have not been active for a while, check with your health care provider before increasing your physical activity.  Choose activities that are safe and enjoyable for you. Ask your health care provider what activities are safe for you.  Start slowly. Tell your health care provider if you have problems as you start to increase your activity level. This information is not intended to replace advice given to you by your health care provider. Make sure you discuss any questions you have with your health care provider. Document Released: 05/05/2016 Document Revised: 09/10/2018 Document Reviewed: 05/05/2016 Elsevier Patient Education  [place].     Pneumococcal Vaccine, Polyvalent solution for injection What is this medicine? PNEUMOCOCCAL VACCINE, POLYVALENT (NEU mo KOK al vak SEEN, pol ee VEY  luhnt) is a vaccine to prevent pneumococcus bacteria infection. These bacteria are a major cause of ear infections, Strep throat infections, and serious pneumonia, meningitis, or blood infections worldwide. These vaccines help the body to produce antibodies (protective substances) that help your body defend against these bacteria. This vaccine is recommended for people 49 years of age and older with health problems. It is also recommended for all adults over 42 years old. This vaccine will not treat an infection. This medicine may be used for other purposes; ask your health care provider or pharmacist if you have questions. COMMON BRAND NAME(S): Pneumovax 23 What should I tell my health care provider before I take this medicine? They need to know if you have any of these conditions:  bleeding problems  bone marrow or organ transplant  cancer, Hodgkin's  disease  fever  infection  immune system problems  low platelet count in the blood  seizures  an unusual or allergic reaction to pneumococcal vaccine, diphtheria toxoid, other vaccines, latex, other medicines, foods, dyes, or preservatives  pregnant or trying to get pregnant  breast-feeding How should I use this medicine? This vaccine is for injection into a muscle or under the skin. It is given by a health care professional. A copy of Vaccine Information Statements will be given before each vaccination. Read this sheet carefully each time. The sheet may change frequently. Talk to your pediatrician regarding the use of this medicine in children. While this drug may be prescribed for children as young as 17 years of age for selected conditions, precautions do apply. Overdosage: If you think you have taken too much of this medicine contact a poison control center or emergency room at once. NOTE: This medicine is only for you. Do not share this medicine with others. What if I miss a dose? It is important not to miss your dose. Call your doctor or health care professional if you are unable to keep an appointment. What may interact with this medicine?  medicines for cancer chemotherapy  medicines that suppress your immune function  medicines that treat or prevent blood clots like warfarin, enoxaparin, and dalteparin  steroid medicines like prednisone or cortisone This list may not describe all possible interactions. Give your health care provider a list of all the medicines, herbs, non-prescription drugs, or dietary supplements you use. Also tell them if you smoke, drink alcohol, or use illegal drugs. Some items may interact with your medicine. What should I watch for while using this medicine? Mild fever and pain should go away in 3 days or less. Report any unusual symptoms to your doctor or health care professional. What side effects may I notice from receiving this medicine? Side  effects that you should report to your doctor or health care professional as soon as possible:  allergic reactions like skin rash, itching or hives, swelling of the face, lips, or tongue  breathing problems  confused  fever over 102 degrees F  pain, tingling, numbness in the hands or feet  seizures  unusual bleeding or bruising  unusual muscle weakness Side effects that usually do not require medical attention (report to your doctor or health care professional if they continue or are bothersome):  aches and pains  diarrhea  fever of 102 degrees F or less  headache  irritable  loss of appetite  pain, tender at site where injected  trouble sleeping This list may not describe all possible side effects. Call your doctor for medical advice about side effects. You  may report side effects to FDA at 1-800-FDA-1088. Where should I keep my medicine? This does not apply. This vaccine is given in a clinic, pharmacy, doctor's office, or other health care setting and will not be stored at home. NOTE: This sheet is a summary. It may not cover all possible information. If you have questions about this medicine, talk to your doctor, pharmacist, or health care provider.  2020 Elsevier/Gold Standard (2007-12-21 14:32:37)

## 2018-12-11 NOTE — Addendum Note (Signed)
Addended by: Hildred Alamin I on: 12/11/2018 10:34 AM   Modules accepted: Orders

## 2018-12-11 NOTE — Assessment & Plan Note (Signed)
Assessment: Patient weight on scale today is 217 Patient reported weight on yesterday's tele-visit was 209 Patient continues to be nonadherent to diet, refused recent referral to dietitian Patient having 2-3+ lower extremity edema Patient reporting orthopnea Patient with abdominal swelling Patient reports adherence to fluid pills  Plan: Continue follow-up with cardiology You must follow a low-salt diet Referral to dietitian again today for medical nutrition therapy Elevate legs Wear compression socks Take diuretics as prescribed Continuing heart failure program from insurance company Walk today to assess oxygen needs

## 2018-12-11 NOTE — Assessment & Plan Note (Addendum)
Assessment: Walk in January/22 revealed patient needs 2 L via nasal cannula with exertion Patient adamant on arrival to office today that she does not need oxygen anymore Walk today in office reveals patient still requires 2 L with physical exertion  Plan: Continue oxygen therapy as prescribed Wear 2 L of O2 when physically exerting herself

## 2018-12-11 NOTE — Assessment & Plan Note (Signed)
Plan: Referral to nutritionist

## 2018-12-11 NOTE — Assessment & Plan Note (Signed)
Assessment: BMI 43.95 Very deconditioned elderly female Leading sedentary lifestyle  Plan: Start daily physical activity Use 2 L of O2 with exertion Follow-up in 3 months

## 2018-12-25 ENCOUNTER — Ambulatory Visit (HOSPITAL_COMMUNITY)
Admission: RE | Admit: 2018-12-25 | Discharge: 2018-12-25 | Disposition: A | Discharge status: Home / Self Care | Payer: Medicare Other | Source: Ambulatory Visit | Attending: Nephrology | Admitting: Nephrology

## 2018-12-25 ENCOUNTER — Other Ambulatory Visit: Payer: Self-pay

## 2018-12-25 VITALS — BP 113/56 | HR 58 | Temp 97.3°F | Resp 18

## 2018-12-25 DIAGNOSIS — D631 Anemia in chronic kidney disease: Secondary | ICD-10-CM | POA: Insufficient documentation

## 2018-12-25 DIAGNOSIS — N184 Chronic kidney disease, stage 4 (severe): Secondary | ICD-10-CM | POA: Insufficient documentation

## 2018-12-25 DIAGNOSIS — N183 Chronic kidney disease, stage 3 unspecified: Secondary | ICD-10-CM

## 2018-12-25 LAB — POCT HEMOGLOBIN-HEMACUE: Hemoglobin: 9.9 g/dL — ABNORMAL LOW (ref 12.0–15.0)

## 2018-12-25 LAB — IRON AND TIBC
Iron: 59 ug/dL (ref 28–170)
Saturation Ratios: 25 % (ref 10.4–31.8)
TIBC: 239 ug/dL — ABNORMAL LOW (ref 250–450)
UIBC: 180 ug/dL

## 2018-12-25 LAB — FERRITIN: Ferritin: 1010 ng/mL — ABNORMAL HIGH (ref 11–307)

## 2018-12-25 MED ORDER — EPOETIN ALFA-EPBX 10000 UNIT/ML IJ SOLN
10000.0000 [IU] | INTRAMUSCULAR | Status: DC
  Administered 2018-12-25: 10:00:00 10000 [IU] via SUBCUTANEOUS
  Filled 2018-12-25: qty 1

## 2019-01-04 ENCOUNTER — Other Ambulatory Visit: Payer: Self-pay | Admitting: Family Medicine

## 2019-01-04 DIAGNOSIS — Z1231 Encounter for screening mammogram for malignant neoplasm of breast: Secondary | ICD-10-CM

## 2019-01-21 ENCOUNTER — Other Ambulatory Visit (HOSPITAL_COMMUNITY): Payer: Self-pay | Admitting: *Deleted

## 2019-01-21 ENCOUNTER — Other Ambulatory Visit: Payer: Self-pay | Admitting: Pulmonary Disease

## 2019-01-22 ENCOUNTER — Other Ambulatory Visit: Payer: Self-pay

## 2019-01-22 ENCOUNTER — Ambulatory Visit (HOSPITAL_COMMUNITY)
Admission: RE | Admit: 2019-01-22 | Discharge: 2019-01-22 | Disposition: A | Discharge status: Home / Self Care | Payer: Medicare Other | Source: Ambulatory Visit | Attending: Nephrology | Admitting: Nephrology

## 2019-01-22 VITALS — BP 138/58 | HR 62 | Temp 97.0°F | Resp 20 | Ht 59.0 in | Wt 213.0 lb

## 2019-01-22 DIAGNOSIS — N183 Chronic kidney disease, stage 3 unspecified: Secondary | ICD-10-CM

## 2019-01-22 LAB — POCT HEMOGLOBIN-HEMACUE: Hemoglobin: 10 g/dL — ABNORMAL LOW (ref 12.0–15.0)

## 2019-01-22 MED ORDER — SODIUM CHLORIDE 0.9 % IV SOLN
510.0000 mg | Freq: Once | INTRAVENOUS | Status: AC
  Administered 2019-01-22: 10:00:00 510 mg via INTRAVENOUS
  Filled 2019-01-22: qty 17

## 2019-01-22 MED ORDER — EPOETIN ALFA-EPBX 10000 UNIT/ML IJ SOLN
10000.0000 [IU] | INTRAMUSCULAR | Status: DC
  Administered 2019-01-22: 11:00:00 10000 [IU] via SUBCUTANEOUS
  Filled 2019-01-22: qty 1

## 2019-01-22 NOTE — Discharge Instructions (Signed)

## 2019-01-23 ENCOUNTER — Ambulatory Visit: Payer: Medicare Other | Admitting: Dietician

## 2019-01-28 ENCOUNTER — Telehealth: Payer: Self-pay | Admitting: Interventional Cardiology

## 2019-01-28 NOTE — Telephone Encounter (Signed)
Called and spoke to patient. She states thst she has gained 6 pounds in 7 days. She states that she has increased swelling in her stomach. Denies SOB except for when she does not wear her O2. Patient admits to eating salty foods. She states that she has already taken an extra torsemide. Patient has been scheduled for phone visit with Dr. Irish Lack on 9/2. Instructed patient to let us know if her Sx change or worsen.

## 2019-01-28 NOTE — Telephone Encounter (Signed)
Pt c/o swelling: STAT is pt has developed SOB within 24 hours  1) How much weight have you gained and in what time span? Week 6 pounds  2) If swelling, where is the swelling located? Stomach and legs  3) Are you currently taking a fluid pill? Yes   4) Are you currently SOB? No   5) Do you have a log of your daily weights (if so, list)? No   6) Have you gained 3 pounds in a day or 5 pounds in a week? 6 pounds about a week  7) Have you traveled recently? No
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
## 2019-01-30 ENCOUNTER — Telehealth (INDEPENDENT_AMBULATORY_CARE_PROVIDER_SITE_OTHER): Payer: Medicare Other | Admitting: Interventional Cardiology

## 2019-01-30 ENCOUNTER — Telehealth: Payer: Self-pay | Admitting: *Deleted

## 2019-01-30 ENCOUNTER — Encounter: Payer: Self-pay | Admitting: Interventional Cardiology

## 2019-01-30 ENCOUNTER — Other Ambulatory Visit: Payer: Self-pay

## 2019-01-30 VITALS — BP 142/63 | HR 67 | Ht 59.0 in | Wt 218.0 lb

## 2019-01-30 DIAGNOSIS — N183 Chronic kidney disease, stage 3 unspecified: Secondary | ICD-10-CM

## 2019-01-30 DIAGNOSIS — I5032 Chronic diastolic (congestive) heart failure: Secondary | ICD-10-CM

## 2019-01-30 DIAGNOSIS — I110 Hypertensive heart disease with heart failure: Secondary | ICD-10-CM

## 2019-01-30 NOTE — Patient Instructions (Addendum)
Medication Instructions:  Your physician has recommended you make the following change in your medication:   Take an extra 1/2 tablet (50 mg total) of torsemide daily for the next 2 days only.  Lab work: We will arrange for Home Health to go out and draw labs this week (BMET and BNP)  If you have labs (blood work) drawn today and your tests are completely normal, you will receive your results only by: Marland Kitchen MyChart Message (if you have MyChart) OR . A paper copy in the mail If you have any lab test that is abnormal or we need to change your treatment, we will call you to review the results.  Testing/Procedures: None ordered  Follow-Up: . Follow up with Dr. Irish Lack in the office on 02/06/19 at 10:00 AM  Any Other Special Instructions Will Be Listed Below (If Applicable).

## 2019-01-30 NOTE — Telephone Encounter (Signed)
[facility] Visit Initial Request  Date of Request ([place]):  January 30, 2019  Requesting Provider:  Casandra Doffing, MD    Agency Requested:    [facility] Contact:  Glory Buff, NP [address] [place], [place] [address] Phone #:  [phone] Fax #:  [phone]  Patient Demographic Information: Name:  Amanda Cain Age:  73 y.o.   DOB:  Oct 21, 1945  MRN:  [id number]   Address:   [address] [place] [address]   Phone Numbers:   Home Phone [phone]  Mobile [phone]     Emergency Contact Information on File:   Contact Information    Name Relation Home Work Mobile   Jamison,Teresa Sister [phone]  [phone]   Malachy Moan [phone]  [phone]   Forero,Geraldine Sister   [phone]      The above family members may be contacted for information on this patient (review DPR on file):  Yes    Patient Clinical Information:  Primary Care Provider:  Mayra Neer, MD  Primary Cardiologist:  Larae Grooms, MD  Primary Electrophysiologist:  None   Past Medical Hx: Ms. Bryand  has a past medical history of Anemia, Anxiety, Arthritis, Bronchitis, CHF (congestive heart failure) ([place]), Chronic kidney disease, Depression, Diabetes mellitus, GERD (gastroesophageal reflux disease), gallstones, Hypercholesteremia, Hypertension, and Hypothyroidism.   Allergies: She is allergic to aspirin.   Medications: Current Outpatient Medications on File Prior to Visit  Medication Sig  . acetaminophen (TYLENOL) 500 MG tablet Take 500 mg by mouth daily as needed for mild pain.   Marland Kitchen albuterol (PROVENTIL HFA;VENTOLIN HFA) 108 (90 Base) MCG/ACT inhaler Inhale 2 puffs into the lungs every 4 (four) hours as needed for wheezing or shortness of breath.  Marland Kitchen atorvastatin (LIPITOR) 40 MG tablet Take 40 mg by mouth daily.   Marland Kitchen azelastine (ASTELIN) 0.1 % nasal spray Place 1 spray into both nostrils 2 (two) times  daily. Use in each nostril as directed  . benzonatate (TESSALON) 100 MG capsule Take 100 mg by mouth 3 (three) times daily.  . Biotin 1000 MCG tablet Take 1,000 mcg by mouth daily.  Marland Kitchen BREO ELLIPTA 200-25 MCG/INH AEPB INHALE 1 PUFF INTO THE LUNGS DAILY  . cholecalciferol (VITAMIN D) 1000 units tablet Take 1,000 Units by mouth daily.  Marland Kitchen diltiazem (CARDIZEM CD) 360 MG 24 hr capsule Take 1 capsule by mouth daily.  . EPOETIN ALFA IJ Inject 1 application as directed every 30 (thirty) days.  Marland Kitchen escitalopram (LEXAPRO) 20 MG tablet Take 20 mg by mouth at bedtime.  Marland Kitchen estradiol (ESTRACE) 0.5 MG tablet Take 0.5 mg by mouth every morning.   . ezetimibe (ZETIA) 10 MG tablet Take 10 mg by mouth daily.  . ferrous sulfate 325 (65 FE) MG tablet Take 325 mg by mouth daily with breakfast.  . glimepiride (AMARYL) 1 MG tablet Take 1 mg by mouth daily with breakfast.   . guaiFENesin (MUCINEX) 600 MG 12 hr tablet Take 600 mg by mouth 2 (two) times daily.  . hydrALAZINE (APRESOLINE) 25 MG tablet Take 1 tablet (25 mg total) by mouth every 8 (eight) hours.  Marland Kitchen levothyroxine (SYNTHROID, LEVOTHROID) 125 MCG tablet Take 250 mcg by mouth daily before breakfast.   . LORazepam (ATIVAN) 0.5 MG tablet   . metoprolol succinate (TOPROL-XL) 100 MG 24 hr tablet Take 100 mg by mouth every morning.   . montelukast (SINGULAIR) 10 MG tablet TAKE 1 TABLET(10 MG) BY MOUTH AT BEDTIME  .  Multiple Vitamin (MULITIVITAMIN WITH MINERALS) TABS Take 1 tablet by mouth daily.  . ondansetron (ZOFRAN-ODT) 4 MG disintegrating tablet Take 4 mg by mouth every 8 (eight) hours as needed for nausea or vomiting.  . paliperidone (INVEGA) 3 MG 24 hr tablet Take 3 mg by mouth at bedtime.  . pantoprazole (PROTONIX) 40 MG tablet TAKE 1 TABLET(40 MG) BY MOUTH DAILY  . Polyethyl Glycol-Propyl Glycol (SYSTANE) 0.4-0.3 % SOLN Apply 1 drop to eye daily as needed (for dry eyes).  . potassium chloride SA (K-DUR) 20 MEQ tablet Take 1 tablet (20 mEq total) by mouth 2  (two) times daily.  . Promethazine-Codeine 6.25-10 MG/5ML SOLN Take 5 mLs by mouth at bedtime as needed (cough/cold).   . saccharomyces boulardii (FLORASTOR) 250 MG capsule Take 1 capsule (250 mg total) by mouth 2 (two) times daily.  . sitaGLIPtin (JANUVIA) 50 MG tablet Take 50 mg by mouth daily.  . sucralfate (CARAFATE) 1 G tablet Take 1 g by mouth 2 (two) times daily.  Marland Kitchen torsemide (DEMADEX) 100 MG tablet Take 100 mg by mouth daily.  . vitamin B-12 (CYANOCOBALAMIN) 1000 MCG tablet Take 1,000 mcg by mouth daily.  . vitamin C (ASCORBIC ACID) 500 MG tablet Take 500 mg by mouth daily.   No current facility-administered medications on file prior to visit.      Social Hx: She  reports that she has never smoked. She has never used smokeless tobacco. She reports that she does not drink alcohol or use drugs.    Diagnosis/Reason for Visit:   LABS, CHF  Services Requested:  Vital Signs (BP, Pulse, O2, Weight)  Physical Exam  Labs:  BMET, PRO BNP  # of Visits Needed/Frequency per Week: 1 VISIT/ THIS WEEK

## 2019-02-02 LAB — BASIC METABOLIC PANEL
BUN/Creatinine Ratio: 21 (ref 12–28)
BUN: 46 mg/dL — ABNORMAL HIGH (ref 8–27)
CO2: 29 mmol/L (ref 20–29)
Calcium: 9.7 mg/dL (ref 8.7–10.3)
Chloride: 95 mmol/L — ABNORMAL LOW (ref 96–106)
Creatinine, Ser: 2.2 mg/dL — ABNORMAL HIGH (ref 0.57–1.00)
GFR calc Af Amer: 25 mL/min/{1.73_m2} — ABNORMAL LOW (ref >59)
GFR calc non Af Amer: 21 mL/min/{1.73_m2} — ABNORMAL LOW (ref >59)
Glucose: 128 mg/dL — ABNORMAL HIGH (ref 65–99)
Potassium: 4.3 mmol/L (ref 3.5–5.2)
Sodium: 138 mmol/L (ref 134–144)

## 2019-02-02 LAB — PRO B NATRIURETIC PEPTIDE: NT-Pro BNP: 870 pg/mL — ABNORMAL HIGH (ref 0–738)
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
## 2019-02-06 ENCOUNTER — Ambulatory Visit (INDEPENDENT_AMBULATORY_CARE_PROVIDER_SITE_OTHER): Payer: Medicare Other | Admitting: Interventional Cardiology

## 2019-02-06 ENCOUNTER — Ambulatory Visit
Admission: RE | Admit: 2019-02-06 | Discharge: 2019-02-06 | Disposition: A | Discharge status: Home / Self Care | Payer: Medicare Other | Source: Ambulatory Visit | Attending: Interventional Cardiology | Admitting: Interventional Cardiology

## 2019-02-06 ENCOUNTER — Other Ambulatory Visit: Payer: Self-pay

## 2019-02-06 ENCOUNTER — Encounter: Payer: Self-pay | Admitting: Interventional Cardiology

## 2019-02-06 VITALS — BP 140/58 | HR 69 | Ht 59.0 in | Wt 222.4 lb

## 2019-02-06 DIAGNOSIS — N183 Chronic kidney disease, stage 3 unspecified: Secondary | ICD-10-CM

## 2019-02-06 DIAGNOSIS — I110 Hypertensive heart disease with heart failure: Secondary | ICD-10-CM | POA: Diagnosis not present

## 2019-02-06 DIAGNOSIS — I5033 Acute on chronic diastolic (congestive) heart failure: Secondary | ICD-10-CM

## 2019-02-06 DIAGNOSIS — R0602 Shortness of breath: Secondary | ICD-10-CM

## 2019-02-06 NOTE — Patient Instructions (Addendum)
Medication Instructions:  Your physician recommends that you continue on your current medications as directed. Please refer to the Current Medication list given to you today.  Continue taking torsemide 100 mg daily.  You may take an extra 1/2 tablet (50 mg) of torsemide AS NEED a couple of times per week for increased swelling, weight gain, or shortness of breath.   If you need a refill on your cardiac medications before your next appointment, please call your pharmacy.   Lab work: None Ordered  If you have labs (blood work) drawn today and your tests are completely normal, you will receive your results only by: Marland Kitchen MyChart Message (if you have MyChart) OR . A paper copy in the mail If you have any lab test that is abnormal or we need to change your treatment, we will call you to review the results.  Testing/Procedures: A chest x-ray takes a picture of the organs and structures inside the chest, including the heart, lungs, and blood vessels. This test can show several things, including, whether the heart is enlarges; whether fluid is building up in the lungs; and whether pacemaker / defibrillator leads are still in place.  Chest X-ray Instructions:    1. You may have this done at the [facility], located in the        [place] on the 1st floor.    2. You do no have to have an appointment.    3. [place], [place] [address]        [phone]        Monday - Friday  8:00 am - 5:00 pm   Follow-Up: At [facility], you and your health needs are our priority.  As part of our continuing mission to provide you with exceptional heart care, we have created designated Provider Care Teams.  These Care Teams include your primary Cardiologist (physician) and Advanced Practice Providers (APPs -  Physician Assistants and Nurse Practitioners) who all work together to provide you with the care you need, when you need it. . You will need a follow  up appointment in 6 months.  Please call our office 2 months in advance to schedule this appointment.  You may see Casandra Doffing, MD or one of the following Advanced Practice Providers on your designated Care Team:   . Lyda Jester, PA-C . Dayna Dunn, PA-C . Ermalinda Barrios, PA-C  Any Other Special Instructions Will Be Listed Below (If Applicable).

## 2019-02-06 NOTE — Progress Notes (Signed)
Erroneously routed to me, will route to Dr. Irish Lack

## 2019-02-19 ENCOUNTER — Other Ambulatory Visit: Payer: Self-pay

## 2019-02-19 ENCOUNTER — Ambulatory Visit (HOSPITAL_COMMUNITY)
Admission: RE | Admit: 2019-02-19 | Discharge: 2019-02-19 | Disposition: A | Discharge status: Home / Self Care | Payer: Medicare Other | Source: Ambulatory Visit | Attending: Nephrology | Admitting: Nephrology

## 2019-02-19 VITALS — BP 165/56 | HR 80 | Temp 98.1°F

## 2019-02-19 DIAGNOSIS — D631 Anemia in chronic kidney disease: Secondary | ICD-10-CM | POA: Insufficient documentation

## 2019-02-19 DIAGNOSIS — N183 Chronic kidney disease, stage 3 unspecified: Secondary | ICD-10-CM

## 2019-02-19 LAB — CBC WITH DIFFERENTIAL/PLATELET
Abs Immature Granulocytes: 0.07 10*3/uL (ref 0.00–0.07)
Basophils Absolute: 0 10*3/uL (ref 0.0–0.1)
Basophils Relative: 0 %
Eosinophils Absolute: 0.2 10*3/uL (ref 0.0–0.5)
Eosinophils Relative: 2 %
HCT: 34.4 % — ABNORMAL LOW (ref 36.0–46.0)
Hemoglobin: 10.6 g/dL — ABNORMAL LOW (ref 12.0–15.0)
Immature Granulocytes: 1 %
Lymphocytes Relative: 12 %
Lymphs Abs: 1.2 10*3/uL (ref 0.7–4.0)
MCH: 28.4 pg (ref 26.0–34.0)
MCHC: 30.8 g/dL (ref 30.0–36.0)
MCV: 92.2 fL (ref 80.0–100.0)
Monocytes Absolute: 0.8 10*3/uL (ref 0.1–1.0)
Monocytes Relative: 7 %
Neutro Abs: 7.9 10*3/uL — ABNORMAL HIGH (ref 1.7–7.7)
Neutrophils Relative %: 78 %
Platelets: 311 10*3/uL (ref 150–400)
RBC: 3.73 MIL/uL — ABNORMAL LOW (ref 3.87–5.11)
RDW: 15.3 % (ref 11.5–15.5)
WBC: 10.2 10*3/uL (ref 4.0–10.5)
nRBC: 0 % (ref 0.0–0.2)

## 2019-02-19 LAB — RENAL FUNCTION PANEL
Albumin: 3.7 g/dL (ref 3.5–5.0)
Anion gap: 14 (ref 5–15)
BUN: 43 mg/dL — ABNORMAL HIGH (ref 8–23)
CO2: 27 mmol/L (ref 22–32)
Calcium: 9.7 mg/dL (ref 8.9–10.3)
Chloride: 94 mmol/L — ABNORMAL LOW (ref 98–111)
Creatinine, Ser: 2.35 mg/dL — ABNORMAL HIGH (ref 0.44–1.00)
GFR calc Af Amer: 23 mL/min — ABNORMAL LOW (ref >60)
GFR calc non Af Amer: 20 mL/min — ABNORMAL LOW (ref >60)
Glucose, Bld: 245 mg/dL — ABNORMAL HIGH (ref 70–99)
Phosphorus: 3.2 mg/dL (ref 2.5–4.6)
Potassium: 3.6 mmol/L (ref 3.5–5.1)
Sodium: 135 mmol/L (ref 135–145)

## 2019-02-19 LAB — FERRITIN: Ferritin: 1317 ng/mL — ABNORMAL HIGH (ref 11–307)

## 2019-02-19 LAB — IRON AND TIBC
Iron: 67 ug/dL (ref 28–170)
Saturation Ratios: 28 % (ref 10.4–31.8)
TIBC: 244 ug/dL — ABNORMAL LOW (ref 250–450)
UIBC: 177 ug/dL

## 2019-02-19 LAB — POCT HEMOGLOBIN-HEMACUE: Hemoglobin: 10.9 g/dL — ABNORMAL LOW (ref 12.0–15.0)

## 2019-02-19 MED ORDER — EPOETIN ALFA-EPBX 10000 UNIT/ML IJ SOLN
10000.0000 [IU] | INTRAMUSCULAR | Status: DC
  Administered 2019-02-19: 10000 [IU] via SUBCUTANEOUS
  Filled 2019-02-19: qty 1

## 2019-02-20 ENCOUNTER — Encounter: Payer: Self-pay | Admitting: Dietician

## 2019-02-20 ENCOUNTER — Ambulatory Visit
Admission: RE | Admit: 2019-02-20 | Discharge: 2019-02-20 | Disposition: A | Discharge status: Home / Self Care | Payer: Medicare Other | Source: Ambulatory Visit | Attending: Family Medicine | Admitting: Family Medicine

## 2019-02-20 ENCOUNTER — Other Ambulatory Visit: Payer: Self-pay

## 2019-02-20 ENCOUNTER — Encounter: Payer: Medicare Other | Attending: Pulmonary Disease | Admitting: Dietician

## 2019-02-20 DIAGNOSIS — E118 Type 2 diabetes mellitus with unspecified complications: Secondary | ICD-10-CM | POA: Diagnosis not present

## 2019-02-20 DIAGNOSIS — Z1231 Encounter for screening mammogram for malignant neoplasm of breast: Secondary | ICD-10-CM

## 2019-02-20 LAB — PTH, INTACT AND CALCIUM
Calcium, Total (PTH): 9.9 mg/dL (ref 8.7–10.3)
PTH: 55 pg/mL (ref 15–65)

## 2019-02-20 LAB — VITAMIN D 25 HYDROXY (VIT D DEFICIENCY, FRACTURES): Vit D, 25-Hydroxy: 26.7 ng/mL — ABNORMAL LOW (ref 30.0–100.0)

## 2019-02-20 NOTE — Patient Instructions (Signed)
It was a pleasure meeting you today!  Remember to include more vegetables throughout the day. Aim to have at least 1 serving with lunch and 1 serving with dinner.   Please contact me in the future if you have any questions or concerns.   Lexine Baton

## 2019-02-20 NOTE — Progress Notes (Signed)
Diabetes Self-Management Education  Visit Type: First/Initial  Appt. Start Time: 8:15am Appt. End Time: 9:10am  02/20/2019  Ms. Amanda Cain, identified by name and date of birth, is a 73 y.o. female with a diagnosis of Diabetes: Type 2.   ASSESSMENT  Pt arrived today with her sitter. Primary concerns today (per pt) include her diet, namely to help with weight management. Referred for diabetes and low-sodium nutrition therapy.   Checks BG 1x/day, fasting, with average readings between 150-180. Pt states she takes multiple medications and asked if these contribute to weight gain/ make it difficult to lose weight.   Pt states she likes fruit and tries to eat 3 servings per day. States "I eat whatever I want." Pt states she knows she does not eat very healthy and knows how to eat better, but that it is hard to make the right food choices. States she knows she needs to limit her salt intake, and does not add salt to foods. States that she is on a 1.5L fluid restriction and sticks to that, and "drinks a lot of water every day."    Diabetes Self-Management Education - 02/20/19 2563      Visit Information   Visit Type  First/Initial      Initial Visit   Diabetes Type  Type 2    Are you currently following a meal plan?  No    Are you taking your medications as prescribed?  Yes    Date Diagnosed  2001      Health Coping   How would you rate your overall health?  Poor      Psychosocial Assessment   Patient Belief/Attitude about Diabetes  Denial    Self-care barriers  Other (comment)   elderly, poor health; has a Actuary   Self-management support  Doctor's office   sitter   Other persons present  Patient;Other (comment)   sitter   Patient Concerns  Nutrition/Meal planning;Weight Control    Special Needs  Instruct caregiver   education provided to both patient and her sitter   Preferred Learning Style  No preference indicated    Learning Readiness  Contemplating    How often do you  need to have someone help you when you read instructions, pamphlets, or other written materials from your doctor or pharmacy?  1 - Never    What is the last grade level you completed in school?  GED      Complications   Last HgB A1C per patient/outside source  7 %   per pt; last A1c per pt chart was 5.7% on 05/12/2018   How often do you check your blood sugar?  1-2 times/day    Fasting Blood glucose range (mg/dL)  130-179    Have you had a dilated eye exam in the past 12 months?  Yes    Have you had a dental exam in the past 12 months?  Yes    Are you checking your feet?  Yes    How many days per week are you checking your feet?  7      Dietary Intake   Breakfast  Cinnamon Toast Crunch cereal + 2% milk   or Cheerios   Snack (morning)  fruit + graham crackers    Lunch  cheeseburgers   or sandwich   Snack (afternoon)  fruit    Dinner  fried chicken + baked potato + green beans    Snack (evening)  2-3 cookies    Beverage(s)  lemonade,  tea, soda      Exercise   Exercise Type  ADL's    How many days per week to you exercise?  1    How many minutes per day do you exercise?  5    Total minutes per week of exercise  5      Patient Education   Previous Diabetes Education  Yes (please comment)    Nutrition management   Role of diet in the treatment of diabetes and the relationship between the three main macronutrients and blood glucose level;Information on hints to eating out and maintain blood glucose control.;Meal options for control of blood glucose level and chronic complications.      Individualized Goals (developed by patient)   Nutrition  General guidelines for healthy choices and portions discussed;Follow meal plan discussed   balanced meals/snacks, low sodium intake, increase vegetables, limit sugar-sweetened beverages   Monitoring   test my blood glucose as discussed   per MD, check fasting level daily     Outcomes   Expected Outcomes  Demonstrated interest in learning.  Expect positive outcomes    Future DMSE  PRN       Individualized Plan for Diabetes Self-Management Training:   Learning Objective:  Patient will have a greater understanding of diabetes self-management. Patient education plan is to attend individual and/or group sessions per assessed needs and concerns.   Intervention: Nutrition education and counseling was provided on balanced eating for diabetes management as well as restricting sodium intake. Pt and her sitter were educated on how to choose/prepare balanced breakfasts and snacks by identifying the best sources of carbohydrates and balancing them with protein. Additionally, we talked about how to create balanced meals that also incorporate vegetables and why balanced eating is important. Together, we came up with ideas of how to incorporate these principles using foods the pt likes/already eats.  Pt was educated on limiting intake of sugar-sweetened beverages for health and blood sugar control. We also reviewed ways to limit sodium intake, particularly by not adding salt to foods and by avoiding high sodium containing foods. Pt was also educated on how to season foods without using salt and good choices to make when eating out/fast food.   Pt states she received many good ideas for how to make better food choices, and her sitter asked many great questions throughout the appointment as well. Pt states she would like to begin by creating a more "balanced plate" based on the MyPlate model we discussed today, and that the best way to do this is add vegetables to her meals.    Patient Instructions  It was a pleasure meeting you today!  Remember to include more vegetables throughout the day. Aim to have at least 1 serving with lunch and 1 serving with dinner.   Expected Outcomes:  Demonstrated interest in learning. Expect positive outcomes  Education material provided: My Plate, Breakfast/Meal Ideas, Balanced Snacks, No Sodium Seasonings  If  problems or questions, patient to contact team via:  Phone and Email  Future DSME appointment: PRN; pt states she would like to call back to schedule follow up visit.

## 2019-02-21 ENCOUNTER — Other Ambulatory Visit: Payer: Self-pay | Admitting: Pulmonary Disease

## 2019-03-06 ENCOUNTER — Other Ambulatory Visit: Payer: Self-pay | Admitting: Pulmonary Disease

## 2019-03-12 NOTE — Progress Notes (Signed)
$'@Patient'A$  ID: Amanda Cain, female    DOB: Apr 06, 1946, 73 y.o.   MRN: [id number]  Chief Complaint  Patient presents with  . Follow-up    Asthma    Referring provider: Mayra Neer, MD  HPI:  73 year old female never smoker followed in our office for Asthma. Pt unable to complete PFTs or FENOs (3 attempts in the past)  PMH: hypertension, coronary artery disease, type 2 diabetes, chronic kidney disease, CHF Smoker/ Smoking History: Never Smoker  Maintenance: Breo 200 Pt of: Dr. Vaughan Browner  03/13/2019  - Visit   73 year old female never smoker followed in our office for asthma.  Patient has been unsuccessful with pulmonary function tests in the past.  Patient also has been unable to complete FeNO's.  Patient presenting to our office today as a 30-monthfollow-up.  Since presenting to our office she has had an acute on chronic CHF exacerbation which is managed outpatient by cardiology.  Her weight remains stable and elevated today.  Her diuretic doses have been increased based off of cardiology's instructions.  Patient has successfully met with the dietitian she reports that she found this helpful.  She has been avoiding drinking orange juice as well as looking at her labels of her foods to try to find low-salt and low-carb items.  Patient unfortunately continues to be sedentary and inactive.  She admits that she only physically exerts herself to get up to walk to the kitchen or to the living room or to the bathroom.  She has no outlined exercise program.  She has not started any the recommendations that we have previously described.  Patient also reports that she has had increased discolored nasal drainage for the last 2 weeks.  She reports she occasionally has had episodes of cold-like symptoms.  Reports being afebrile in the past.  She is afebrile today.  She suspects she may be having a sinus infection.  She is having discolored nasal drainage yellow to green that is blood-tinged.  She  remains adherent to [place]  She uses her rescue inhaler 1 time daily.  She is not using her nasal saline rinses.   Questionaires / Pulmonary Flowsheets:   ACT:  Asthma Control Test ACT Total Score  03/13/2019 20    Tests:   Imaging  Chest x-ray 11/17/16-CHF with interstitial edema CT neck 02/19/16-lung apices appear clear CT abdomen 11/19/11-lung bases appear clear. Chest x-ray 12/12/17- bibasilar atelectasis, basilar opacities.   Chest x-ray 03/31/2018- cardiomegaly, mild vascular congestion. 05/14/2018-VQ scan-pulmonary embolism absent, very low probability of PE 05/13/2018-chest x-ray-cardiomegaly without acute disease  PFTs 04/05/17-unable to complete FENO 02/27/17- unable to complete FENO 08/23/17-unable to complete  Cardiac Echo 11/17/16 LVEF 60 to 689% grade 2 diastolic dysfunction, high ventricular filling pressures. - Left ventricle: The cavity size was normal. There was moderate  Echo 11/14/17- Mild LVH, LVEF 538-10% grade 2 diastolic dysfunction  Right heart catheterization 12/15/2017 Moderately to severely elevated left and right heart filling pressures and pulmonary hypertension. Normal Fick cardiac output/index.  RA (mean): 20 mmHg RV (S/EDP): 68/20 mmHg PA (S/D, mean): 66/30 (42) mmHg PCWP (mean): 30 mmHg  Ao sat: 99% (vial pulseoximeter) PA sat: 63%  Fick CO: 5.4 L/min Fick CI: 2.7 L/min/m^2  Labs:  05/13/2018-influenza panel-negative for A and B 05/13/2018-respiratory virus panel-none detected 117/51/0258-NIDPOmetabolic panel- sodium 1242 potassium 5.5 (potential hemolysis), CH-91, CO2 29, glucose 108, BUN 37, creatinine 2.25, GFR 24 135/36/1443-XVQMGmetabolic panel- sodium 1867 potassium 4.3, chloride 91, CO2 27, BUN 69,  creatinine 2.52, GFR 21  08/09/2018 - BNP - 2064  FENO:  No results found for: NITRICOXIDE  PFT: No flowsheet data found.  WALK:  SIX MIN WALK 12/11/2018 06/19/2018  Supplimental Oxygen during Test? (L/min) No -  Tech  Comments: Patient walked at steady pace and O2 was placed back on after drop at lap one. ER patient didn't finish half a lap before she had to have O2 applied and was fatigued. Patient has 2L O2 and maintained sats of 93%, patient gets anxious without O2 reminded how to take slow deep breaths-patient walked at a very slow pace until O2 was applied then slow to moderate pace    Imaging: Mm 3d Screen Breast Bilateral  Result Date: 02/20/2019 CLINICAL DATA:  Screening. EXAM: DIGITAL SCREENING BILATERAL MAMMOGRAM WITH TOMO AND CAD COMPARISON:  Previous exam(s). ACR Breast Density Category b: There are scattered areas of fibroglandular density. FINDINGS: There are no findings suspicious for malignancy. Images were processed with CAD. IMPRESSION: No mammographic evidence of malignancy. A result letter of this screening mammogram will be mailed directly to the patient. RECOMMENDATION: Screening mammogram in one year. (Code:SM-B-01Y) BI-RADS CATEGORY  1: Negative. Electronically Signed   By: Lajean Manes M.D.   On: 02/20/2019 16:27    Lab Results:  CBC    Component Value Date/Time   WBC 10.2 02/19/2019 1037   RBC 3.73 (L) 02/19/2019 1037   HGB 10.6 (L) 02/19/2019 1037   HCT 34.4 (L) 02/19/2019 1037   PLT 311 02/19/2019 1037   MCV 92.2 02/19/2019 1037   MCH 28.4 02/19/2019 1037   MCHC 30.8 02/19/2019 1037   RDW 15.3 02/19/2019 1037   LYMPHSABS 1.2 02/19/2019 1037   MONOABS 0.8 02/19/2019 1037   EOSABS 0.2 02/19/2019 1037   BASOSABS 0.0 02/19/2019 1037    BMET    Component Value Date/Time   NA 135 02/19/2019 1038   NA 138 02/01/2019 1520   K 3.6 02/19/2019 1038   CL 94 (L) 02/19/2019 1038   CO2 27 02/19/2019 1038   GLUCOSE 245 (H) 02/19/2019 1038   BUN 43 (H) 02/19/2019 1038   BUN 46 (H) 02/01/2019 1520   CREATININE 2.35 (H) 02/19/2019 1038   CALCIUM 9.7 02/19/2019 1038   CALCIUM 9.9 02/19/2019 1037   GFRNONAA 20 (L) 02/19/2019 1038   GFRAA 23 (L) 02/19/2019 1038    BNP     Component Value Date/Time   BNP 278.6 (H) 10/05/2018 0930   BNP 172.1 (H) 05/12/2018 1457    ProBNP    Component Value Date/Time   PROBNP 870 (H) 02/01/2019 1520   PROBNP 130.6 (H) 11/19/2011 2155    Specialty Problems      Pulmonary Problems   Acute respiratory failure with hypoxia (HCC)   Influenza A   Flu   CAP (community acquired pneumonia)   Community acquired pneumonia   Chronic respiratory failure with hypoxia ([place])    January/2020 walk required patient needs 2 L via nasal cannula with exertion 12/11/2018-walk in office test patient required 2 L of O2 with physical exertion      Asthma    PFTs 04/05/17-unable to complete FENO 02/27/17- unable to complete FENO 08/23/17-unable to complete      Shortness of breath   Acute recurrent maxillary sinusitis      Allergies  Allergen Reactions  . Aspirin Other (See Comments)    " Have an ulcer"    Immunization History  Administered Date(s) Administered  . Influenza Split 02/28/2016  .  Influenza, High Dose Seasonal PF 03/12/2018  . Influenza,inj,Quad PF,6+ Mos 03/03/2015, 02/27/2017  . Pneumococcal Conjugate-13 02/28/2016  . Pneumococcal Polysaccharide-23 12/11/2018   Needs flu vaccine today  Past Medical History:  Diagnosis Date  . Anemia   . Anxiety    severe  . Arthritis   . Bronchitis    hx of  . CHF (congestive heart failure) ([place])   . Chronic kidney disease    "kidney disease stage 3"  . Depression   . Diabetes mellitus   . GERD (gastroesophageal reflux disease)   . Hx of gallstones   . Hypercholesteremia   . Hypertension   . Hypothyroidism   . Obesity     Tobacco History: Social History   Tobacco Use  Smoking Status Never Smoker  Smokeless Tobacco Never Used   Counseling given: Yes  Continue to not smoke  Outpatient Encounter Medications as of 03/13/2019  Medication Sig  . acetaminophen (TYLENOL) 500 MG tablet Take 500 mg by mouth daily as needed for mild pain.   Marland Kitchen albuterol  (PROVENTIL HFA;VENTOLIN HFA) 108 (90 Base) MCG/ACT inhaler Inhale 2 puffs into the lungs every 4 (four) hours as needed for wheezing or shortness of breath.  Marland Kitchen atorvastatin (LIPITOR) 40 MG tablet Take 40 mg by mouth daily.   Marland Kitchen azelastine (ASTELIN) 0.1 % nasal spray Place 1 spray into both nostrils 2 (two) times daily. Use in each nostril as directed  . benzonatate (TESSALON) 100 MG capsule Take 100 mg by mouth 3 (three) times daily.  . Biotin 1000 MCG tablet Take 1,000 mcg by mouth daily.  Marland Kitchen BREO ELLIPTA 200-25 MCG/INH AEPB INHALE 1 PUFF INTO THE LUNGS DAILY  . cholecalciferol (VITAMIN D) 1000 units tablet Take 1,000 Units by mouth daily.  Marland Kitchen diltiazem (CARDIZEM CD) 360 MG 24 hr capsule Take 1 capsule by mouth daily.  . EPOETIN ALFA IJ Inject 1 application as directed every 30 (thirty) days.  Marland Kitchen escitalopram (LEXAPRO) 20 MG tablet Take 20 mg by mouth at bedtime.  Marland Kitchen estradiol (ESTRACE) 0.5 MG tablet Take 0.5 mg by mouth every morning.   . ezetimibe (ZETIA) 10 MG tablet Take 10 mg by mouth daily.  . ferrous sulfate 325 (65 FE) MG tablet Take 325 mg by mouth daily with breakfast.  . glimepiride (AMARYL) 1 MG tablet Take 1 mg by mouth daily with breakfast.   . guaiFENesin (MUCINEX) 600 MG 12 hr tablet Take 600 mg by mouth 2 (two) times daily.  Marland Kitchen levothyroxine (SYNTHROID, LEVOTHROID) 125 MCG tablet Take 250 mcg by mouth daily before breakfast.   . LORazepam (ATIVAN) 0.5 MG tablet   . metoprolol succinate (TOPROL-XL) 100 MG 24 hr tablet Take 100 mg by mouth every morning.   . montelukast (SINGULAIR) 10 MG tablet TAKE 1 TABLET(10 MG) BY MOUTH AT BEDTIME  . Multiple Vitamin (MULITIVITAMIN WITH MINERALS) TABS Take 1 tablet by mouth daily.  . ondansetron (ZOFRAN-ODT) 4 MG disintegrating tablet Take 4 mg by mouth every 8 (eight) hours as needed for nausea or vomiting.  . paliperidone (INVEGA) 3 MG 24 hr tablet Take 3 mg by mouth at bedtime.  . pantoprazole (PROTONIX) 40 MG tablet TAKE 1 TABLET BY MOUTH  DAILY  . Polyethyl Glycol-Propyl Glycol (SYSTANE) 0.4-0.3 % SOLN Apply 1 drop to eye daily as needed (for dry eyes).  . potassium chloride SA (K-DUR) 20 MEQ tablet Take 1 tablet (20 mEq total) by mouth 2 (two) times daily.  . Promethazine-Codeine 6.25-10 MG/5ML SOLN Take 5 mLs by  mouth at bedtime as needed (cough/cold).   . saccharomyces boulardii (FLORASTOR) 250 MG capsule Take 1 capsule (250 mg total) by mouth 2 (two) times daily.  . sitaGLIPtin (JANUVIA) 50 MG tablet Take 50 mg by mouth daily.  . sucralfate (CARAFATE) 1 G tablet Take 1 g by mouth 2 (two) times daily.  Marland Kitchen torsemide (DEMADEX) 100 MG tablet Take 100 mg by mouth daily.  . vitamin B-12 (CYANOCOBALAMIN) 1000 MCG tablet Take 1,000 mcg by mouth daily.  . vitamin C (ASCORBIC ACID) 500 MG tablet Take 500 mg by mouth daily.  Marland Kitchen doxycycline (VIBRA-TABS) 100 MG tablet Take 1 tablet (100 mg total) by mouth 2 (two) times daily.  . hydrALAZINE (APRESOLINE) 25 MG tablet Take 1 tablet (25 mg total) by mouth every 8 (eight) hours.   No facility-administered encounter medications on file as of 03/13/2019.     Review of Systems  Review of Systems  Constitutional: Positive for fatigue. Negative for activity change and fever.  HENT: Positive for congestion, postnasal drip and sinus pressure. Negative for sinus pain and sore throat.   Respiratory: Positive for shortness of breath. Negative for cough and wheezing.   Cardiovascular: Positive for leg swelling. Negative for chest pain and palpitations.  Gastrointestinal: Negative for diarrhea, nausea and vomiting.  Musculoskeletal: Negative for arthralgias.  Neurological: Negative for dizziness.  Psychiatric/Behavioral: Negative for sleep disturbance. The patient is not nervous/anxious.      Physical Exam  BP 124/70   Pulse 76   Temp 97.6 F (36.4 C) (Temporal)   Ht 4' 11"  (1.499 m)   Wt 222 lb 6.4 oz (100.9 kg)   SpO2 92%   BMI 44.92 kg/m   Wt Readings from Last 5 Encounters:   03/13/19 222 lb 6.4 oz (100.9 kg)  02/06/19 222 lb 6.4 oz (100.9 kg)  01/30/19 218 lb (98.9 kg)  01/22/19 213 lb (96.6 kg)  12/11/18 217 lb 9.6 oz (98.7 kg)    BMI Readings from Last 5 Encounters:  03/13/19 44.92 kg/m  02/06/19 44.92 kg/m  01/30/19 44.03 kg/m  01/22/19 43.02 kg/m  12/11/18 43.95 kg/m    Physical Exam Vitals signs and nursing note reviewed.  Constitutional:      General: She is not in acute distress.    Appearance: Normal appearance. She is obese.  HENT:     Head: Normocephalic and atraumatic.     Right Ear: Tympanic membrane, ear canal and external ear normal. There is impacted cerumen.     Left Ear: Tympanic membrane, ear canal and external ear normal. There is impacted cerumen.     Nose: Congestion and rhinorrhea present.     Right Sinus: Maxillary sinus tenderness present.     Left Sinus: Maxillary sinus tenderness present.     Mouth/Throat:     Mouth: Mucous membranes are moist.     Pharynx: Oropharynx is clear.  Eyes:     Pupils: Pupils are equal, round, and reactive to light.  Neck:     Musculoskeletal: Normal range of motion.  Cardiovascular:     Rate and Rhythm: Normal rate and regular rhythm.     Pulses: Normal pulses.     Heart sounds: Normal heart sounds. No murmur.  Pulmonary:     Effort: Pulmonary effort is normal. No respiratory distress.     Breath sounds: Normal breath sounds. No decreased air movement. No decreased breath sounds, wheezing or rales.  Musculoskeletal:     Right lower leg: 3+ Pitting Edema present.  Left lower leg: 3+ Pitting Edema present.  Skin:    General: Skin is warm and dry.     Capillary Refill: Capillary refill takes less than 2 seconds.  Neurological:     General: No focal deficit present.     Mental Status: She is alert and oriented to person, place, and time. Mental status is at baseline.     Gait: Gait abnormal.  Psychiatric:        Mood and Affect: Mood normal.        Behavior: Behavior normal.         Thought Content: Thought content normal.        Judgment: Judgment normal.       Assessment & Plan:   CHF (congestive heart failure) (HCC) Plan: Continue to follow-up with cardiology Continue to follow low-salt diet Continue to follow-up with dietitian Continue to elevate your legs Continue to wear compression stockings, do not exceed more than 16 hours of wearing compression socks in a 24. Take your diuretics as prescribed Continue with our heart failure program from insurance company Continue to weigh yourself daily Notify cardiology of weight increases or worsening shortness of breath with weight increase  Pulmonary hypertension ([place]) Plan: Continue follow-up with cardiology Continue to follow low-sodium diet Continue to take diuretics as prescribed Continue to weigh yourself daily  Asthma Plan: Continue Breo Ellipta inhaler Continue rescue inhaler as needed Continue to increase overall physical activity He must continue to work on losing weight and reducing BMI Goal should continue to be that you lose between 10 to 15% of your current body weight  Chronic respiratory failure with hypoxia ([place]) Plan: Continue to wear oxygen therapy as prescribed Where 2 L of O2 and physically exerting yourself  Morbid obesity ([place]) Plan: Continue to increase daily physical activity Continues 2 L of O2 with physical exertion Continue to work with dietitian Continue to follow low-sodium and low carbohydrate diet  Physical deconditioning Persistent prominent patient continues to be physically inactive. Patient admits that this is due to her own motivation willpower She is interested in exercise program to help guide her with increasing her physical activity  Plan: Referral to exercise program today Wear oxygen when physically exerting herself  Acute recurrent maxillary sinusitis Plan: Doxycycline today Start nasal saline rinses Sample of nasal saline rinse provided  to patient today    Return in about 6 weeks (around 04/24/2019), or if symptoms worsen or fail to improve, for Follow up with Dr. Vaughan Browner.   Lauraine Rinne, NP 03/13/2019   This appointment was 32 minutes long with over 50% of the time in direct face-to-face patient care, assessment, plan of care, and follow-up.

## 2019-03-13 ENCOUNTER — Encounter: Payer: Self-pay | Admitting: Pulmonary Disease

## 2019-03-13 ENCOUNTER — Ambulatory Visit (INDEPENDENT_AMBULATORY_CARE_PROVIDER_SITE_OTHER): Payer: Medicare Other | Admitting: Pulmonary Disease

## 2019-03-13 ENCOUNTER — Other Ambulatory Visit: Payer: Self-pay

## 2019-03-13 VITALS — BP 124/70 | HR 76 | Temp 97.6°F | Ht 59.0 in | Wt 222.4 lb

## 2019-03-13 DIAGNOSIS — I272 Pulmonary hypertension, unspecified: Secondary | ICD-10-CM | POA: Diagnosis not present

## 2019-03-13 DIAGNOSIS — I5032 Chronic diastolic (congestive) heart failure: Secondary | ICD-10-CM

## 2019-03-13 DIAGNOSIS — J0101 Acute recurrent maxillary sinusitis: Secondary | ICD-10-CM

## 2019-03-13 DIAGNOSIS — J9611 Chronic respiratory failure with hypoxia: Secondary | ICD-10-CM | POA: Diagnosis not present

## 2019-03-13 DIAGNOSIS — J4540 Moderate persistent asthma, uncomplicated: Secondary | ICD-10-CM | POA: Diagnosis not present

## 2019-03-13 DIAGNOSIS — R5381 Other malaise: Secondary | ICD-10-CM

## 2019-03-13 MED ORDER — DOXYCYCLINE HYCLATE 100 MG PO TABS: 100.0000 mg | ORAL_TABLET | Freq: Two times a day (BID) | ORAL | 0 refills | Status: DC

## 2019-03-13 NOTE — Assessment & Plan Note (Signed)
Plan: Doxycycline today Start nasal saline rinses Sample of nasal saline rinse provided to patient today

## 2019-03-13 NOTE — Patient Instructions (Addendum)
You were seen today by Lauraine Rinne, NP  for:   1. Moderate persistent asthma without complication  Breo Ellipta 200 >>> Take 1 puff daily in the morning right when you wake up >>>Rinse your mouth out after use >>>This is a daily maintenance inhaler, NOT a rescue inhaler >>>Contact our office if you are having difficulties affording or obtaining this medication >>>It is important for you to be able to take this daily and not miss any doses    Only use your albuterol as a rescue medication to be used if you can't catch your breath by resting or doing a relaxed purse lip breathing pattern.  - The less you use it, the better it will work when you need it. - Ok to use up to 2 puffs  every 4 hours if you must but call for immediate appointment if use goes up over your usual need - Don't leave home without it !!  (think of it like the spare tire for your car)   - Amb Referral To Provider Referral Exercise Program (P.R.E.P)  2. Pulmonary hypertension ([place])  Continue to take diuretics Continue to weigh yourself every morning Continue to follow a low-sodium diet Work on increasing daily physical activity Wear your oxygen when physically exerting herself 2 L  3. Chronic respiratory failure with hypoxia (HCC)  Continue oxygen therapy as prescribed  >>>maintain oxygen saturations greater than 88 percent  >>>if unable to maintain oxygen saturations please contact the office  >>>do not smoke with oxygen  >>>can use nasal saline gel or nasal saline rinses to moisturize nose if oxygen causes dryness  4. Morbid obesity ([place])  Please work on increasing her daily physical activity we have referred you to an exercise program today  - Amb Referral To Provider Referral Exercise Program (P.R.E.P)  5. Chronic diastolic congestive heart failure (HCC)  Continue to follow-up with cardiology Continue take your diuretics as prescribed Continue to follow low-sodium and low-carb diet Continue to weigh  yourself every morning Notify cardiology of weight increases  6. Acute recurrent maxillary sinusitis  - doxycycline (VIBRA-TABS) 100 MG tablet; Take 1 tablet (100 mg total) by mouth 2 (two) times daily.  Dispense: 14 tablet; Refill: 0  Please also start nasal saline rinses twice daily  Can continue to use half dose of Delsym as needed for cough Can also use Tessalon Perles as needed for cough  We recommend today:  Orders Placed This Encounter  Procedures  . Amb Referral To Provider Referral Exercise Program (P.R.E.P)    Referral Priority:   Routine    Referral Type:   Consultation    Number of Visits Requested:   1   Orders Placed This Encounter  Procedures  . Amb Referral To Provider Referral Exercise Program (P.R.E.P)   Meds ordered this encounter  Medications  . doxycycline (VIBRA-TABS) 100 MG tablet    Sig: Take 1 tablet (100 mg total) by mouth 2 (two) times daily.    Dispense:  14 tablet    Refill:  0    Follow Up:     Return in about 6 weeks (around 04/24/2019), or if symptoms worsen or fail to improve, for Follow up with Dr. Vaughan Browner.   Please do your part to reduce the spread of COVID-19:      Reduce your risk of any infection  and COVID19 by using the similar precautions used for avoiding the common cold or flu:  Marland Kitchen Wash your hands often with soap and warm  water for at least 20 seconds.  If soap and water are not readily available, use an alcohol-based hand sanitizer with at least 60% alcohol.  . If coughing or sneezing, cover your mouth and nose by coughing or sneezing into the elbow areas of your shirt or coat, into a tissue or into your sleeve (not your hands). Langley Gauss A MASK when in public  . Avoid shaking hands with others and consider head nods or verbal greetings only. . Avoid touching your eyes, nose, or mouth with unwashed hands.  . Avoid close contact with people who are sick. . Avoid places or events with large numbers of people in one location, like  concerts or sporting events. . If you have some symptoms but not all symptoms, continue to monitor at home and seek medical attention if your symptoms worsen. . If you are having a medical emergency, call 911.   [place] / e-Visit: [email or web address]         [facility]: [facility]: [phone]                   [facility]: [phone]     It is flu season:   >>> Best ways to protect herself from the flu: Receive the yearly flu vaccine, practice good hand hygiene washing with soap and also using hand sanitizer when available, eat a nutritious meals, get adequate rest, hydrate appropriately   Please contact the office if your symptoms worsen or you have concerns that you are not improving.   Thank you for choosing  [facility] for your healthcare, and for allowing [place] to partner with you on your healthcare journey. I am thankful to be able to provide care to you today.   Wyn Quaker FNP-C

## 2019-03-13 NOTE — Assessment & Plan Note (Signed)
Persistent prominent patient continues to be physically inactive. Patient admits that this is due to her own motivation willpower She is interested in exercise program to help guide her with increasing her physical activity  Plan: Referral to exercise program today Wear oxygen when physically exerting herself

## 2019-03-13 NOTE — Assessment & Plan Note (Signed)
Plan: Continue to wear oxygen therapy as prescribed Where 2 L of O2 and physically exerting yourself

## 2019-03-13 NOTE — Assessment & Plan Note (Signed)
Plan: Continue Breo Ellipta inhaler Continue rescue inhaler as needed Continue to increase overall physical activity He must continue to work on losing weight and reducing BMI Goal should continue to be that you lose between 10 to 15% of your current body weight

## 2019-03-13 NOTE — Assessment & Plan Note (Signed)
Plan: Continue to follow-up with cardiology Continue to follow low-salt diet Continue to follow-up with dietitian Continue to elevate your legs Continue to wear compression stockings, do not exceed more than 16 hours of wearing compression socks in a 24. Take your diuretics as prescribed Continue with our heart failure program from insurance company Continue to weigh yourself daily Notify cardiology of weight increases or worsening shortness of breath with weight increase

## 2019-03-13 NOTE — Assessment & Plan Note (Signed)
Plan: Continue follow-up with cardiology Continue to follow low-sodium diet Continue to take diuretics as prescribed Continue to weigh yourself daily

## 2019-03-13 NOTE — Assessment & Plan Note (Signed)
Plan: Continue to increase daily physical activity Continues 2 L of O2 with physical exertion Continue to work with dietitian Continue to follow low-sodium and low carbohydrate diet

## 2019-03-19 ENCOUNTER — Other Ambulatory Visit: Payer: Self-pay

## 2019-03-19 ENCOUNTER — Ambulatory Visit (HOSPITAL_COMMUNITY)
Admission: RE | Admit: 2019-03-19 | Discharge: 2019-03-19 | Disposition: A | Discharge status: Home / Self Care | Payer: Medicare Other | Source: Ambulatory Visit | Attending: Nephrology | Admitting: Nephrology

## 2019-03-19 DIAGNOSIS — N184 Chronic kidney disease, stage 4 (severe): Secondary | ICD-10-CM | POA: Diagnosis present

## 2019-03-19 LAB — IRON AND TIBC
Iron: 68 ug/dL (ref 28–170)
Saturation Ratios: 31 % (ref 10.4–31.8)
TIBC: 218 ug/dL — ABNORMAL LOW (ref 250–450)
UIBC: 150 ug/dL

## 2019-03-19 LAB — FERRITIN: Ferritin: 1018 ng/mL — ABNORMAL HIGH (ref 11–307)

## 2019-03-19 MED ORDER — EPOETIN ALFA-EPBX 10000 UNIT/ML IJ SOLN
10000.0000 [IU] | INTRAMUSCULAR | Status: DC
  Administered 2019-03-19: 10:00:00 10000 [IU] via SUBCUTANEOUS
  Filled 2019-03-19: qty 1

## 2019-03-20 LAB — POCT HEMOGLOBIN-HEMACUE: Hemoglobin: 10.9 g/dL — ABNORMAL LOW (ref 12.0–15.0)

## 2019-03-27 ENCOUNTER — Encounter: Payer: Self-pay | Admitting: Podiatry

## 2019-03-27 ENCOUNTER — Other Ambulatory Visit: Payer: Self-pay

## 2019-03-27 ENCOUNTER — Ambulatory Visit (INDEPENDENT_AMBULATORY_CARE_PROVIDER_SITE_OTHER): Payer: Medicare Other | Admitting: Podiatry

## 2019-03-27 DIAGNOSIS — B351 Tinea unguium: Secondary | ICD-10-CM

## 2019-03-27 DIAGNOSIS — M79675 Pain in left toe(s): Secondary | ICD-10-CM

## 2019-03-27 DIAGNOSIS — E118 Type 2 diabetes mellitus with unspecified complications: Secondary | ICD-10-CM | POA: Diagnosis not present

## 2019-03-27 DIAGNOSIS — M79674 Pain in right toe(s): Secondary | ICD-10-CM

## 2019-03-27 NOTE — Progress Notes (Signed)
Complaint:  Visit Type: Patient returns to my office for continued preventative foot care services. Complaint: Patient states" my nails have grown long and thick and become painful to walk and wear shoes" Patient has been diagnosed with DM with no foot complications. The patient presents for preventative foot care services. No changes to ROS  Podiatric Exam: Vascular: dorsalis pedis and posterior tibial pulses are palpable bilateral. Capillary return is immediate. Temperature gradient is WNL. Skin turgor WNL  Sensorium: Normal Semmes Weinstein monofilament test. Normal tactile sensation bilaterally. Nail Exam: Pt has thick disfigured discolored nails with subungual debris noted bilateral entire nail hallux through fifth toenails Ulcer Exam: There is no evidence of ulcer or pre-ulcerative changes or infection. Orthopedic Exam: Muscle tone and strength are WNL. No limitations in general ROM. No crepitus or effusions noted. Foot type and digits show no abnormalities. HAV  B/L.Pes planus. Skin: No Porokeratosis. No infection or ulcers  Diagnosis:  Onychomycosis, , Pain in right toe, pain in left toes  Treatment & Plan Procedures and Treatment: Consent by patient was obtained for treatment procedures.   Debridement of mycotic and hypertrophic toenails, 1 through 5 bilateral and clearing of subungual debris. No ulceration, no infection noted.   Return Visit-Office Procedure: Patient instructed to return to the office for a follow up visit 3 months for continued evaluation and treatment.    Kohler Pellerito DPM 

## 2019-04-10 NOTE — Progress Notes (Signed)
  15 minutes spent with the patient on this encounter.  Jettie Booze, MD

## 2019-04-16 ENCOUNTER — Other Ambulatory Visit: Payer: Self-pay

## 2019-04-16 ENCOUNTER — Ambulatory Visit (HOSPITAL_COMMUNITY)
Admission: RE | Admit: 2019-04-16 | Discharge: 2019-04-16 | Disposition: A | Discharge status: Home / Self Care | Payer: Medicare Other | Source: Ambulatory Visit | Attending: Nephrology | Admitting: Nephrology

## 2019-04-16 DIAGNOSIS — N184 Chronic kidney disease, stage 4 (severe): Secondary | ICD-10-CM | POA: Insufficient documentation

## 2019-04-16 DIAGNOSIS — D631 Anemia in chronic kidney disease: Secondary | ICD-10-CM | POA: Insufficient documentation

## 2019-04-16 LAB — IRON AND TIBC
Iron: 68 ug/dL (ref 28–170)
Saturation Ratios: 30 % (ref 10.4–31.8)
TIBC: 225 ug/dL — ABNORMAL LOW (ref 250–450)
UIBC: 157 ug/dL

## 2019-04-16 LAB — POCT HEMOGLOBIN-HEMACUE: Hemoglobin: 11.7 g/dL — ABNORMAL LOW (ref 12.0–15.0)

## 2019-04-16 LAB — FERRITIN: Ferritin: 1444 ng/mL — ABNORMAL HIGH (ref 11–307)

## 2019-04-16 MED ORDER — EPOETIN ALFA-EPBX 10000 UNIT/ML IJ SOLN
10000.0000 [IU] | INTRAMUSCULAR | Status: DC
  Administered 2019-04-16: 10:00:00 10000 [IU] via SUBCUTANEOUS
  Filled 2019-04-16: qty 1

## 2019-05-01 ENCOUNTER — Other Ambulatory Visit: Payer: Self-pay

## 2019-05-01 ENCOUNTER — Encounter: Payer: Self-pay | Admitting: Pulmonary Disease

## 2019-05-01 ENCOUNTER — Ambulatory Visit (INDEPENDENT_AMBULATORY_CARE_PROVIDER_SITE_OTHER): Payer: Medicare Other | Admitting: Pulmonary Disease

## 2019-05-01 DIAGNOSIS — J4530 Mild persistent asthma, uncomplicated: Secondary | ICD-10-CM | POA: Diagnosis not present

## 2019-05-01 NOTE — Progress Notes (Signed)
Virtual Visit via Telephone Note  I connected with Amanda Cain on 05/01/19 at  9:45 AM EST by telephone and verified that I am speaking with the correct person using two identifiers.  Location: Patient: Home Provider: [facility], [place], [place]   I discussed the limitations, risks, security and privacy concerns of performing an evaluation and management service by telephone and the availability of in person appointments. I also discussed with the patient that there may be a patient responsible charge related to this service. The patient expressed understanding and agreed to proceed.   History of Present Illness: 73 year old with mild persistent asthma, pulmonary hypertension, diastolic heart failure, CKD  Treated for asthma with Breo but diagnosis is not confirmed as she had been unable to complete FENO's and PFTs in multiple locations Has recurrent admission with CHF, pulmonary venous hypertension Right heart catheterization in July 2019 showed moderately to severely elevated left and right heart filling pressures and pulmonary hypertension.  Observations/Objective: States that breathing is doing well with no issues Continues on [place]. She was started on supplemental oxygen after hospitalization in late 2019.  She is currently off it with good O2 sats.  Act score 05/01/2019-20  Assessment and Plan: Mild asthma Continue Breo for now.  Consider down titration of inhaler as she has remained stable Suspect that her asthma is not a major issues Dyspnea is multifactorial secondary to diastolic heart failure, pulmonary venous hypertension, deconditioning, body habitus  Follow Up Instructions: Continue Breo In person visit in 6 months to discuss down titration of inhaler regimen.  I discussed the assessment and treatment plan with the patient. The patient was provided an opportunity to ask questions and all were answered. The patient agreed with the plan and  demonstrated an understanding of the instructions.   The patient was advised to call back or seek an in-person evaluation if the symptoms worsen or if the condition fails to improve as anticipated.  I provided 25 minutes of non-face-to-face time during this encounter.   Marshell Garfinkel MD [facility] 05/01/2019, 10:14 AM

## 2019-05-02 NOTE — Patient Instructions (Signed)
Glad you are doing well with regard to your breathing Continue Breo Follow-up in person visit in 6 months.

## 2019-05-14 ENCOUNTER — Encounter (HOSPITAL_COMMUNITY): Payer: PRIVATE HEALTH INSURANCE

## 2019-05-20 ENCOUNTER — Other Ambulatory Visit: Payer: Self-pay

## 2019-05-20 MED ORDER — MONTELUKAST SODIUM 10 MG PO TABS: ORAL_TABLET | ORAL | 2 refills | Status: DC

## 2019-06-05 ENCOUNTER — Other Ambulatory Visit: Payer: Self-pay | Admitting: Pulmonary Disease

## 2019-07-11 ENCOUNTER — Ambulatory Visit: Payer: Medicare Other | Admitting: Interventional Cardiology

## 2019-07-15 ENCOUNTER — Ambulatory Visit: Payer: Medicare Other | Admitting: Internal Medicine

## 2019-07-22 ENCOUNTER — Ambulatory Visit: Payer: Medicare Other

## 2019-07-24 ENCOUNTER — Ambulatory Visit: Payer: Medicare Other | Admitting: Podiatry

## 2019-07-29 ENCOUNTER — Telehealth: Payer: Self-pay | Admitting: Interventional Cardiology

## 2019-07-29 NOTE — Telephone Encounter (Signed)
New Message  We are recommending the COVID-19 vaccine to all of our patients. Cardiac medications (including blood thinners) should not deter anyone from being vaccinated and there is no need to hold any of those medications prior to vaccine administration.     Currently, there is a hotline to call (active 06/07/19) to schedule vaccination appointments as no walk-ins will be accepted.   Number: [phone].    If an appointment is not available please go to [email or web address] to sign up for notification when additional vaccine appointments are available.   If you have further questions or concerns about the vaccine process, please visit [email or web address] or contact your primary care physician.   
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
## 2019-07-31 ENCOUNTER — Telehealth (INDEPENDENT_AMBULATORY_CARE_PROVIDER_SITE_OTHER): Payer: Medicare Other | Admitting: Physician Assistant

## 2019-07-31 ENCOUNTER — Other Ambulatory Visit: Payer: Self-pay

## 2019-07-31 ENCOUNTER — Encounter: Payer: Self-pay | Admitting: Physician Assistant

## 2019-07-31 VITALS — BP 146/70 | HR 63 | Ht 59.0 in | Wt 217.0 lb

## 2019-07-31 DIAGNOSIS — J9611 Chronic respiratory failure with hypoxia: Secondary | ICD-10-CM

## 2019-07-31 DIAGNOSIS — R079 Chest pain, unspecified: Secondary | ICD-10-CM

## 2019-07-31 DIAGNOSIS — I5032 Chronic diastolic (congestive) heart failure: Secondary | ICD-10-CM | POA: Diagnosis not present

## 2019-07-31 DIAGNOSIS — Z8739 Personal history of other diseases of the musculoskeletal system and connective tissue: Secondary | ICD-10-CM

## 2019-07-31 DIAGNOSIS — E785 Hyperlipidemia, unspecified: Secondary | ICD-10-CM

## 2019-07-31 DIAGNOSIS — I110 Hypertensive heart disease with heart failure: Secondary | ICD-10-CM | POA: Diagnosis not present

## 2019-07-31 DIAGNOSIS — I10 Essential (primary) hypertension: Secondary | ICD-10-CM

## 2019-07-31 NOTE — Patient Instructions (Signed)
Medication Instructions:  Your physician recommends that you continue on your current medications as directed. Please refer to the Current Medication list given to you today.  *If you need a refill on your cardiac medications before your next appointment, please call your pharmacy*   Lab Work: Please have labwork done on the 13th with your Primary Care Docotr faxed over to [place]  If you have labs (blood work) drawn today and your tests are completely normal, you will receive your results only by: Marland Kitchen MyChart Message (if you have MyChart) OR . A paper copy in the mail If you have any lab test that is abnormal or we need to change your treatment, we will call you to review the results.   Testing/Procedures: None ordered   Follow-Up: At [facility], you and your health needs are our priority.  As part of our continuing mission to provide you with exceptional heart care, we have created designated Provider Care Teams.  These Care Teams include your primary Cardiologist (physician) and Advanced Practice Providers (APPs -  Physician Assistants and Nurse Practitioners) who all work together to provide you with the care you need, when you need it.  We recommend signing up for the patient portal called "MyChart".  Sign up information is provided on this After Visit Summary.  MyChart is used to connect with patients for Virtual Visits (Telemedicine).  Patients are able to view lab/test results, encounter notes, upcoming appointments, etc.  Non-urgent messages can be sent to your provider as well.   To learn more about what you can do with MyChart, go to [email or web address].    Your next appointment:   4 month(s)  The format for your next appointment:   In Person  Provider:   You may see Larae Grooms, MD or one of the following Advanced Practice Providers on your designated Care Team:    Melina Copa, PA-C  Ermalinda Barrios, PA-C    Other Instructions Two Gram Sodium Diet 2000  mg  What is Sodium? Sodium is a mineral found naturally in many foods. The most significant source of sodium in the diet is table salt, which is about 40% sodium.  Processed, convenience, and preserved foods also contain a large amount of sodium.  The body needs only 500 mg of sodium daily to function,  A normal diet provides more than enough sodium even if you do not use salt.  Why Limit Sodium? A build up of sodium in the body can cause thirst, increased blood pressure, shortness of breath, and water retention.  Decreasing sodium in the diet can reduce edema and risk of heart attack or stroke associated with high blood pressure.  Keep in mind that there are many other factors involved in these health problems.  Heredity, obesity, lack of exercise, cigarette smoking, stress and what you eat all play a role.  General Guidelines:  Do not add salt at the table or in cooking.  One teaspoon of salt contains over 2 grams of sodium.  Read food labels  Avoid processed and convenience foods  Ask your dietitian before eating any foods not dicussed in the menu planning guidelines  Consult your physician if you wish to use a salt substitute or a sodium containing medication such as antacids.  Limit milk and milk products to 16 oz (2 cups) per day.  Shopping Hints:  READ LABELS!! "Dietetic" does not necessarily mean low sodium.  Salt and other sodium ingredients are often added to foods during processing.  Menu Planning Guidelines Food Group Choose More Often Avoid  Beverages (see also the milk group All fruit juices, low-sodium, salt-free vegetables juices, low-sodium carbonated beverages Regular vegetable or tomato juices, commercially softened water used for drinking or cooking  Breads and Cereals Enriched white, wheat, rye and pumpernickel bread, hard rolls and dinner rolls; muffins, cornbread and waffles; most dry cereals, cooked cereal without added salt; unsalted crackers and breadsticks; low  sodium or homemade bread crumbs Bread, rolls and crackers with salted tops; quick breads; instant hot cereals; pancakes; commercial bread stuffing; self-rising flower and biscuit mixes; regular bread crumbs or cracker crumbs  Desserts and Sweets Desserts and sweets mad with mild should be within allowance Instant pudding mixes and cake mixes  Fats Butter or margarine; vegetable oils; unsalted salad dressings, regular salad dressings limited to 1 Tbs; light, sour and heavy cream Regular salad dressings containing bacon fat, bacon bits, and salt pork; snack dips made with instant soup mixes or processed cheese; salted nuts  Fruits Most fresh, frozen and canned fruits Fruits processed with salt or sodium-containing ingredient (some dried fruits are processed with sodium sulfites        Vegetables Fresh, frozen vegetables and low- sodium canned vegetables Regular canned vegetables, sauerkraut, pickled vegetables, and others prepared in brine; frozen vegetables in sauces; vegetables seasoned with ham, bacon or salt pork  Condiments, Sauces, Miscellaneous  Salt substitute with physician's approval; pepper, herbs, spices; vinegar, lemon or lime juice; hot pepper sauce; garlic powder, onion powder, low sodium soy sauce (1 Tbs.); low sodium condiments (ketchup, chili sauce, mustard) in limited amounts (1 tsp.) fresh ground horseradish; unsalted tortilla chips, pretzels, potato chips, popcorn, salsa (1/4 cup) Any seasoning made with salt including garlic salt, celery salt, onion salt, and seasoned salt; sea salt, rock salt, kosher salt; meat tenderizers; monosodium glutamate; mustard, regular soy sauce, barbecue, sauce, chili sauce, teriyaki sauce, steak sauce, Worcestershire sauce, and most flavored vinegars; canned gravy and mixes; regular condiments; salted snack foods, olives, picles, relish, horseradish sauce, catsup   Food preparation: Try these seasonings Meats:    Pork Sage, onion Serve with applesauce   Chicken Poultry seasoning, thyme, parsley Serve with cranberry sauce  Lamb Curry powder, rosemary, garlic, thyme Serve with mint sauce or jelly  Veal Marjoram, basil Serve with current jelly, cranberry sauce  Beef Pepper, bay leaf Serve with dry mustard, unsalted chive butter  Fish [place] leaf, dill Serve with unsalted lemon butter, unsalted parsley butter  Vegetables:    Asparagus Lemon juice   Broccoli Lemon juice   Carrots Mustard dressing parsley, mint, nutmeg, glazed with unsalted butter and sugar   Green beans Marjoram, lemon juice, nutmeg,dill seed   Tomatoes Basil, marjoram, onion   Spice /blend for [employer]" 4 tsp ground thyme 1 tsp ground sage 3 tsp ground rosemary 4 tsp ground marjoram   Test your knowledge 1. A product that says "Salt Free" may still contain sodium. True or False 2. Garlic Powder and Hot Pepper Sauce an be used as alternative seasonings.True or False 3. Processed foods have more sodium than fresh foods.  True or False 4. Canned Vegetables have less sodium than froze True or False  WAYS TO DECREASE YOUR SODIUM INTAKE 1. Avoid the use of added salt in cooking and at the table.  Table salt (and other prepared seasonings which contain salt) is probably one of the greatest sources of sodium in the diet.  Unsalted foods can gain flavor from the sweet, sour, and butter taste  sensations of herbs and spices.  Instead of using salt for seasoning, try the following seasonings with the foods listed.  Remember: how you use them to enhance natural food flavors is limited only by your creativity... Allspice-Meat, fish, eggs, fruit, peas, red and yellow vegetables Almond Extract-Fruit baked goods Anise Seed-Sweet breads, fruit, carrots, beets, cottage cheese, cookies (tastes like licorice) Basil-Meat, fish, eggs, vegetables, rice, vegetables salads, soups, sauces Bay Leaf-Meat, fish, stews, poultry Burnet-Salad, vegetables (cucumber-like flavor) Caraway Seed-Bread,  cookies, cottage cheese, meat, vegetables, cheese, rice Cardamon-Baked goods, fruit, soups Celery Powder or seed-Salads, salad dressings, sauces, meatloaf, soup, bread.Do not use  celery salt Chervil-Meats, salads, fish, eggs, vegetables, cottage cheese (parsley-like flavor) Chili Power-Meatloaf, chicken cheese, corn, eggplant, egg dishes Chives-Salads cottage cheese, egg dishes, soups, vegetables, sauces Cilantro-Salsa, casseroles Cinnamon-Baked goods, fruit, pork, lamb, chicken, carrots Cloves-Fruit, baked goods, fish, pot roast, green beans, beets, carrots Coriander-Pastry, cookies, meat, salads, cheese (lemon-orange flavor) Cumin-Meatloaf, fish,cheese, eggs, cabbage,fruit pie (caraway flavor) [employer], fruit, eggs, fish, poultry, cottage cheese, vegetables Dill Seed-Meat, cottage cheese, poultry, vegetables, fish, salads, bread Fennel Seed-Bread, cookies, apples, pork, eggs, fish, beets, cabbage, cheese, Licorice-like flavor Garlic-(buds or powder) Salads, meat, poultry, fish, bread, butter, vegetables, potatoes.Do not  use garlic salt Ginger-Fruit, vegetables, baked goods, meat, fish, poultry Horseradish Root-Meet, vegetables, butter Lemon Juice or Extract-Vegetables, fruit, tea, baked goods, fish salads Mace-Baked goods fruit, vegetables, fish, poultry (taste like nutmeg) Maple Extract-Syrups Marjoram-Meat, chicken, fish, vegetables, breads, green salads (taste like Sage) Mint-Tea, lamb, sherbet, vegetables, desserts, carrots, cabbage Mustard, Dry or Seed-Cheese, eggs, meats, vegetables, poultry Nutmeg-Baked goods, fruit, chicken, eggs, vegetables, desserts Onion Powder-Meat, fish, poultry, vegetables, cheese, eggs, bread, rice salads (Do not use   Onion salt) Orange Extract-Desserts, baked goods Oregano-Pasta, eggs, cheese, onions, pork, lamb, fish, chicken, vegetables, green salads Paprika-Meat, fish, poultry, eggs, cheese, vegetables Parsley Flakes-Butter, vegetables,  meat fish, poultry, eggs, bread, salads (certain forms may   Contain sodium Pepper-Meat fish, poultry, vegetables, eggs Peppermint Extract-Desserts, baked goods Poppy Seed-Eggs, bread, cheese, fruit dressings, baked goods, noodles, vegetables, cottage  [employer], poultry, meat, fish, cauliflower, turnips,eggs bread Saffron-Rice, bread, veal, chicken, fish, eggs Sage-Meat, fish, poultry, onions, eggplant, tomateos, pork, stews Savory-Eggs, salads, poultry, meat, rice, vegetables, soups, pork Tarragon-Meat, poultry, fish, eggs, butter, vegetables (licorice-like flavor)  Thyme-Meat, poultry, fish, eggs, vegetables, (clover-like flavor), sauces, soups Tumeric-Salads, butter, eggs, fish, rice, vegetables (saffron-like flavor) Vanilla Extract-Baked goods, candy Vinegar-Salads, vegetables, meat marinades Walnut Extract-baked goods, candy  2. Choose your Foods Wisely   The following is a list of foods to avoid which are high in sodium:  Meats-Avoid all smoked, canned, salt cured, dried and kosher meat and fish as well as Anchovies   [employer] meats:Bologna, Liverwurst, Pastrami Canned meat or fish  Marinated herring Caviar    Pepperoni Corned Beef   Pizza Dried chipped beef  Salami Frozen breaded fish or meat Salt pork Frankfurters or hot dogs  Sardines Gefilte fish   Sausage Ham (boiled ham, Proscuitto Smoked butt    spiced ham)   Spam      TV Dinners Vegetables Canned vegetables (Regular) Relish Canned mushrooms  Sauerkraut Olives    Tomato juice Pickles  Bakery and Dessert Products Canned puddings  Cream pies Cheesecake   Decorated cakes Cookies  Beverages/Juices Tomato juice, regular  Gatorade   V-8 vegetable juice, regular  Breads and Cereals Biscuit mixes   Salted potato chips, corn chips, pretzels Bread stuffing mixes  Salted crackers and  rolls Pancake and waffle mixes Self-rising  flour  Seasonings Accent    Meat sauces Barbecue sauce  Meat tenderizer Catsup    Monosodium glutamate (MSG) Celery salt   Onion salt Chili sauce   Prepared mustard Garlic salt   Salt, seasoned salt, sea salt Gravy mixes   Soy sauce Horseradish   Steak sauce Ketchup   Tartar sauce Lite salt    Teriyaki sauce Marinade mixes   Worcestershire sauce  Others Baking powder   Cocoa and cocoa mixes Baking soda   Commercial casserole mixes Candy-caramels, chocolate  Dehydrated soups    Bars, fudge,nougats  Instant rice and pasta mixes Canned broth or soup  Maraschino cherries Cheese, aged and processed cheese and cheese spreads  Learning Assessment Quiz  Indicated T (for True) or F (for False) for each of the following statements:  1. _____ Fresh fruits and vegetables and unprocessed grains are generally low in sodium 2. _____ Water may contain a considerable amount of sodium, depending on the source 3. _____ You can always tell if a food is high in sodium by tasting it 4. _____ Certain laxatives my be high in sodium and should be avoided unless prescribed   by a physician or pharmacist 5. _____ Salt substitutes may be used freely by anyone on a sodium restricted diet 6. _____ Sodium is present in table salt, food additives and as a natural component of   most foods 7. _____ Table salt is approximately 90% sodium 8. _____ Limiting sodium intake may help prevent excess fluid accumulation in the body 9. _____ On a sodium-restricted diet, seasonings such as bouillon soy sauce, and    cooking wine should be used in place of table salt 10. _____ On an ingredient list, a product which lists monosodium glutamate as the first   ingredient is an appropriate food to include on a low sodium diet  Circle the best answer(s) to the following statements (Hint: there may be more than one correct answer)  11. On a low-sodium diet, some acceptable snack items are:    A. Olives  F. Bean dip   K.  Grapefruit juice    B. Salted Pretzels G. Commercial Popcorn   L. Canned peaches    C. Carrot Sticks  H. Bouillon   M. Unsalted nuts   D. [place] fries  I. Peanut butter crackers N. Salami   E. Sweet pickles J. Tomato Juice   O. Pizza  12.  Seasonings that may be used freely on a reduced - sodium diet include   A. Lemon wedges F.Monosodium glutamate K. Celery seed    B.Soysauce   G. Pepper   L. Mustard powder   C. Sea salt  H. Cooking wine  M. Onion flakes   D. Vinegar  E. Prepared horseradish N. Salsa   E. Sage   J. Worcestershire sauce  O. Chutney

## 2019-08-08 ENCOUNTER — Ambulatory Visit: Payer: Medicare Other | Admitting: Internal Medicine

## 2019-08-23 ENCOUNTER — Other Ambulatory Visit: Payer: Self-pay

## 2019-08-23 MED ORDER — PANTOPRAZOLE SODIUM 40 MG PO TBEC: 40.0000 mg | DELAYED_RELEASE_TABLET | Freq: Every day | ORAL | 0 refills | Status: DC

## 2019-09-13 ENCOUNTER — Ambulatory Visit (INDEPENDENT_AMBULATORY_CARE_PROVIDER_SITE_OTHER): Payer: Medicare Other | Admitting: Podiatry

## 2019-09-13 ENCOUNTER — Other Ambulatory Visit: Payer: Self-pay

## 2019-09-13 ENCOUNTER — Encounter: Payer: Self-pay | Admitting: Podiatry

## 2019-09-13 VITALS — Temp 97.8°F

## 2019-09-13 DIAGNOSIS — M79674 Pain in right toe(s): Secondary | ICD-10-CM

## 2019-09-13 DIAGNOSIS — B351 Tinea unguium: Secondary | ICD-10-CM | POA: Diagnosis not present

## 2019-09-13 DIAGNOSIS — E118 Type 2 diabetes mellitus with unspecified complications: Secondary | ICD-10-CM | POA: Diagnosis not present

## 2019-09-13 DIAGNOSIS — M79675 Pain in left toe(s): Secondary | ICD-10-CM

## 2019-09-13 DIAGNOSIS — N183 Chronic kidney disease, stage 3 unspecified: Secondary | ICD-10-CM

## 2019-09-13 NOTE — Progress Notes (Signed)
This patient returns to my office for at risk foot care.  This patient requires this care by a professional since this patient will be at risk due to having diabetes and chronic kidney disease.  Patient has not been seen in over 6 months.  This patient is unable to cut nails himself since the patient cannot reach his nails.These nails are painful walking and wearing shoes.  This patient presents for at risk foot care today.  General Appearance  Alert, conversant and in no acute stress.  Vascular  Dorsalis pedis and posterior tibial  pulses are weakly  palpable  bilaterally.  Capillary return is within normal limits  bilaterally. Temperature is within normal limits  bilaterally.  Neurologic  Senn-Weinstein monofilament wire test within normal limits  bilaterally. Muscle power within normal limits bilaterally.  Nails Thick disfigured discolored nails with subungual debris  from hallux to fifth toes bilaterally. No evidence of bacterial infection or drainage bilaterally.  Orthopedic  No limitations of motion  feet .  No crepitus or effusions noted.  No bony pathology or digital deformities noted.  HAV  .  Pes planus.  Skin  normotropic skin with no porokeratosis noted bilaterally.  No signs of infections or ulcers noted.     Onychomycosis  Pain in right toes  Pain in left toes  Consent was obtained for treatment procedures.   Mechanical debridement of nails 1-5  bilaterally performed with a nail nipper.  Filed with dremel without incident.    Return office visit   3 months                  Told patient to return for periodic foot care and evaluation due to potential at risk complications.   Gardiner Barefoot DPM

## 2019-10-01 ENCOUNTER — Telehealth: Payer: Self-pay | Admitting: Internal Medicine

## 2019-10-01 NOTE — Telephone Encounter (Signed)
Per Dr Kelton Pillar - please do not schedule this patient with her again.

## 2019-10-08 ENCOUNTER — Other Ambulatory Visit: Payer: Self-pay | Admitting: *Deleted

## 2019-10-08 MED ORDER — BREO ELLIPTA 200-25 MCG/INH IN AEPB: INHALATION_SPRAY | RESPIRATORY_TRACT | 1 refills | Status: DC

## 2019-10-29 ENCOUNTER — Other Ambulatory Visit: Payer: Self-pay

## 2019-10-29 MED ORDER — POTASSIUM CHLORIDE CRYS ER 20 MEQ PO TBCR: 20.0000 meq | EXTENDED_RELEASE_TABLET | Freq: Two times a day (BID) | ORAL | 9 refills | Status: DC

## 2019-11-15 ENCOUNTER — Other Ambulatory Visit: Payer: Self-pay | Admitting: *Deleted

## 2019-11-15 MED ORDER — MONTELUKAST SODIUM 10 MG PO TABS: ORAL_TABLET | ORAL | 2 refills | Status: DC

## 2019-11-20 ENCOUNTER — Other Ambulatory Visit: Payer: Self-pay | Admitting: *Deleted

## 2019-11-20 MED ORDER — PANTOPRAZOLE SODIUM 40 MG PO TBEC: 40.0000 mg | DELAYED_RELEASE_TABLET | Freq: Every day | ORAL | 0 refills | Status: DC

## 2019-11-29 LAB — TSH: TSH: 0.26 — AB (ref 0.41–5.90)

## 2019-11-29 LAB — BASIC METABOLIC PANEL
BUN: 45 — AB (ref 4–21)
Creatinine: 2.2 — AB (ref 0.5–1.1)
Glucose: 128
Sodium: 135 — AB (ref 137–147)

## 2019-11-29 LAB — HEMOGLOBIN A1C: Hemoglobin A1C: 6.8

## 2019-12-13 ENCOUNTER — Encounter: Payer: Self-pay | Admitting: Podiatry

## 2019-12-13 ENCOUNTER — Other Ambulatory Visit: Payer: Self-pay

## 2019-12-13 ENCOUNTER — Ambulatory Visit (INDEPENDENT_AMBULATORY_CARE_PROVIDER_SITE_OTHER): Payer: Medicare Other | Admitting: Podiatry

## 2019-12-13 DIAGNOSIS — E118 Type 2 diabetes mellitus with unspecified complications: Secondary | ICD-10-CM | POA: Diagnosis not present

## 2019-12-13 DIAGNOSIS — N183 Chronic kidney disease, stage 3 unspecified: Secondary | ICD-10-CM | POA: Diagnosis not present

## 2019-12-13 DIAGNOSIS — M79675 Pain in left toe(s): Secondary | ICD-10-CM | POA: Diagnosis not present

## 2019-12-13 DIAGNOSIS — B351 Tinea unguium: Secondary | ICD-10-CM

## 2019-12-13 DIAGNOSIS — M79674 Pain in right toe(s): Secondary | ICD-10-CM

## 2019-12-13 NOTE — Progress Notes (Signed)
This patient returns to my office for at risk foot care.  This patient requires this care by a professional since this patient will be at risk due to having diabetes and chronic kidney disease.  Patient has not been seen in over 6 months.  This patient is unable to cut nails himself since the patient cannot reach his nails.These nails are painful walking and wearing shoes.  This patient presents for at risk foot care today.  General Appearance  Alert, conversant and in no acute stress.  Vascular  Dorsalis pedis and posterior tibial  pulses are weakly  palpable  bilaterally.  Capillary return is within normal limits  bilaterally. Temperature is within normal limits  bilaterally.  Neurologic  Senn-Weinstein monofilament wire test within normal limits  bilaterally. Muscle power within normal limits bilaterally.  Nails Thick disfigured discolored nails with subungual debris  from hallux to fifth toes bilaterally. No evidence of bacterial infection or drainage bilaterally.  Orthopedic  No limitations of motion  feet .  No crepitus or effusions noted.  No bony pathology or digital deformities noted.  HAV  .  Pes planus.  Skin  normotropic skin with no porokeratosis noted bilaterally.  No signs of infections or ulcers noted.     Onychomycosis  Pain in right toes  Pain in left toes  Consent was obtained for treatment procedures.   Mechanical debridement of nails 1-5  bilaterally performed with a nail nipper.  Filed with dremel without incident.    Return office visit   3 months                  Told patient to return for periodic foot care and evaluation due to potential at risk complications.   Gardiner Barefoot DPM

## 2019-12-16 ENCOUNTER — Encounter: Payer: Self-pay | Admitting: Endocrinology

## 2019-12-16 ENCOUNTER — Other Ambulatory Visit: Payer: Self-pay | Admitting: Pulmonary Disease

## 2019-12-17 ENCOUNTER — Other Ambulatory Visit: Payer: Self-pay | Admitting: Pulmonary Disease

## 2019-12-21 ENCOUNTER — Telehealth: Payer: Self-pay | Admitting: Medical

## 2019-12-21 NOTE — Telephone Encounter (Signed)
   Patient called the after hours line to report severe shoulder stiffness/pain. This has been going on since last Sunday and is progressively worsening. She attempted to get in to see her PCP this week but the earliest appointment is Monday 7/26. She does not feel she can wait that long to be seen. She did have some jaw pain this morning for which her sister activated EMS and EKG was reportedly okay. She has not had any chest pain, SOB, or changes in her LE edema (she takes torsemide daily). No exertional complaints. Suspect MSK etiology. Encourage OTC medications and if these do not work, suggested she be seen at an urgent care facility. She was in agreement with the plan.  Abigail Butts, PA-C 12/21/19; 9:25 AM

## 2020-01-13 ENCOUNTER — Telehealth: Payer: Self-pay | Admitting: Pulmonary Disease

## 2020-01-13 ENCOUNTER — Telehealth: Payer: Self-pay | Admitting: Interventional Cardiology

## 2020-01-13 NOTE — Telephone Encounter (Signed)
Patient calling to speak with someone in Dr. Hassell Done office regarding her oxygen machine. She states she needs the company to come pick it up but does not know the name of the physician or assistant that ordered it for her.

## 2020-01-13 NOTE — Telephone Encounter (Signed)
Returned call to patient. She states that someone with the last name "Warner Mccreedy ordered her oxygen and she cannot remember who it was and states that she has questions. Made patient aware that she has seen Wyn Quaker at [facility]. Gave patient the number to call [facility]. Patient appreciated the return call.

## 2020-01-14 NOTE — Telephone Encounter (Signed)
Called and spoke to pt. Pt requesting to d/c o2. Pt last seen in 04/2019 via video visit. Appt made for 01/28/20 with BPM for 6 month ROV, as advised at last OV, and possibly d/c o2. Pt verbalized understanding and denied any further questions or concerns at this time.

## 2020-01-24 ENCOUNTER — Ambulatory Visit: Payer: Medicare Other | Admitting: Endocrinology

## 2020-01-28 ENCOUNTER — Ambulatory Visit: Payer: Medicare Other | Admitting: Pulmonary Disease

## 2020-02-04 IMAGING — DX DG CHEST 2V
2 series · 2 of 2 positions shown · non-contrast
Comparison: Radiographs July 26, 2017.

CLINICAL DATA: Shortness of breath.

EXAM:
CHEST - 2 VIEW

[dg chest 2 view (1 of 2)]
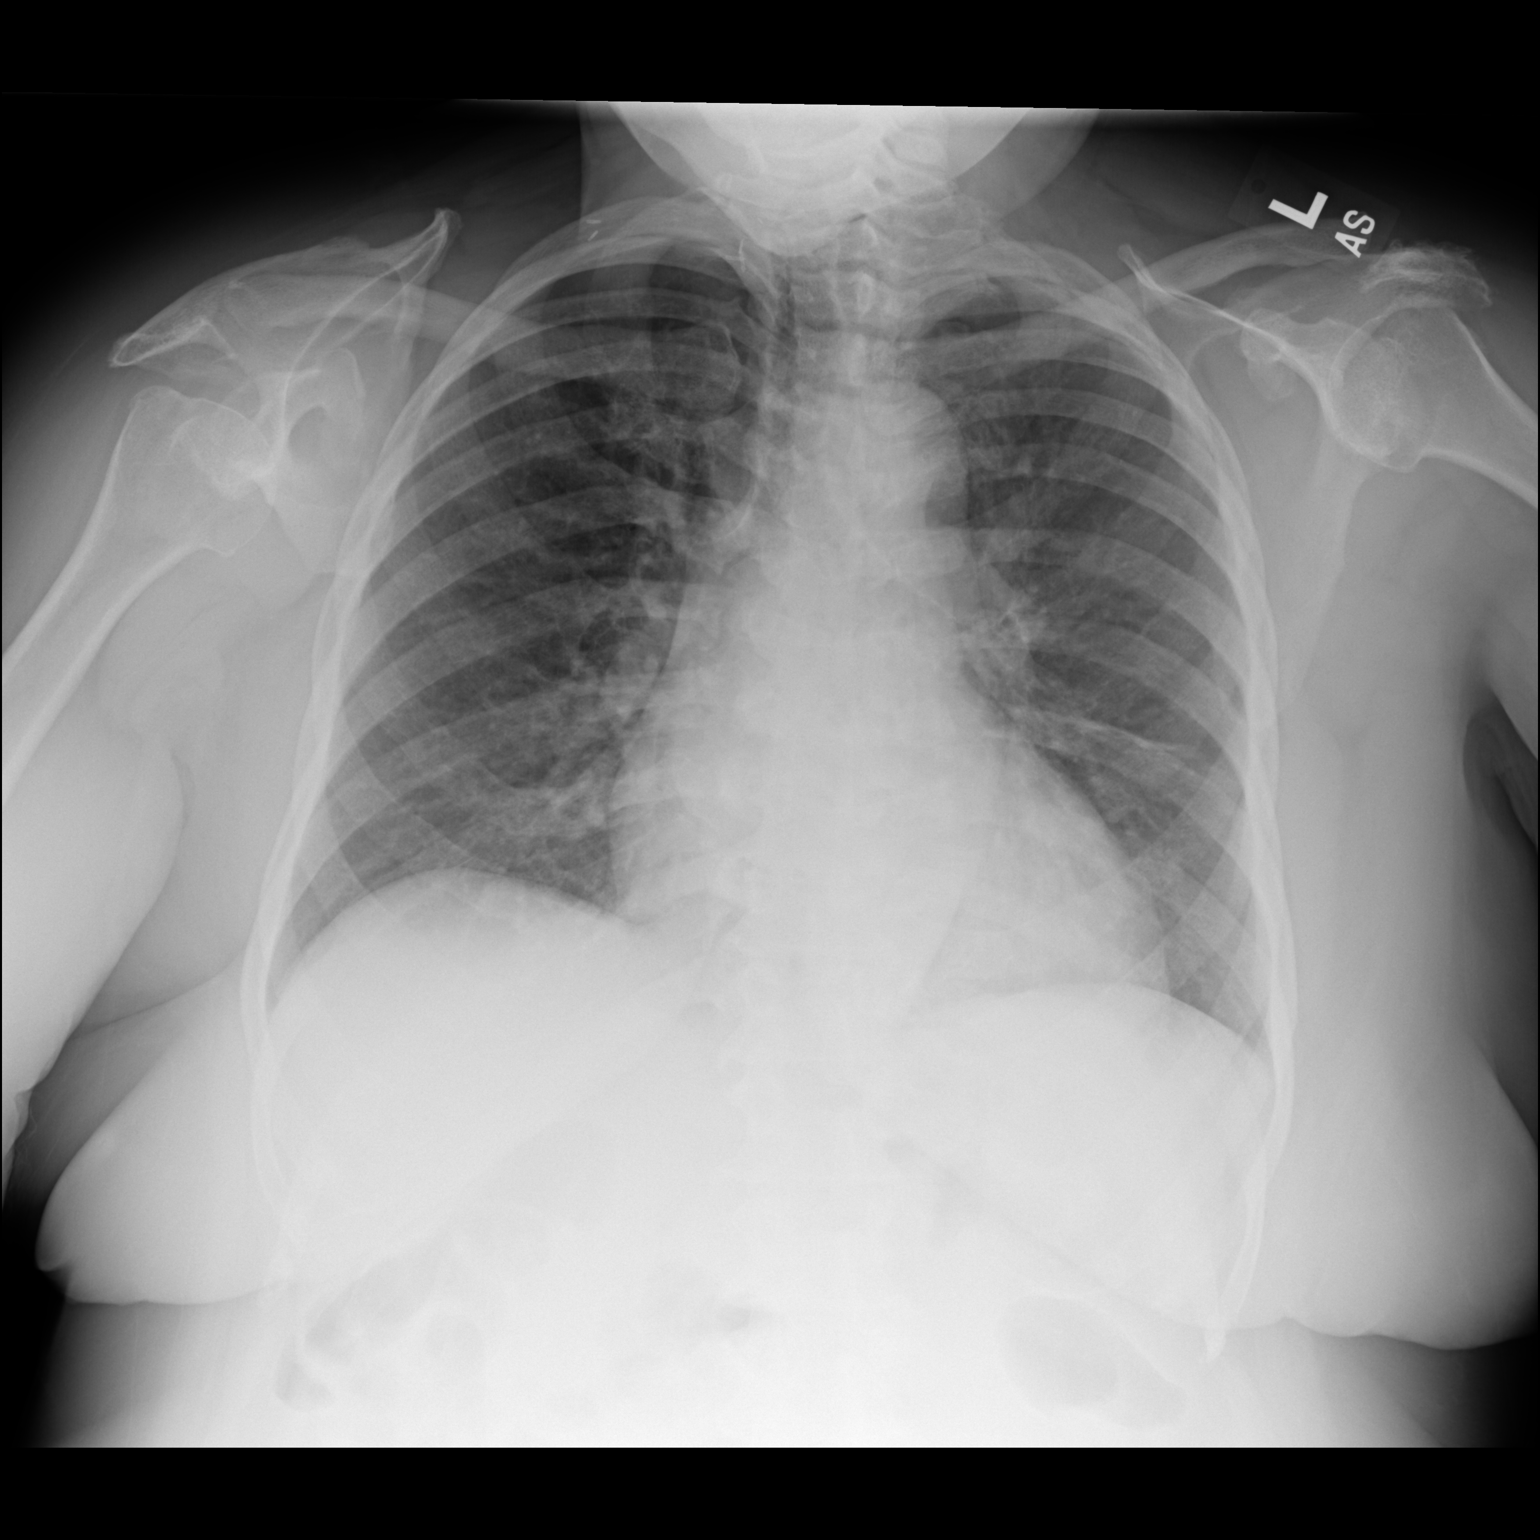

[dg chest 2 view (2 of 2)]
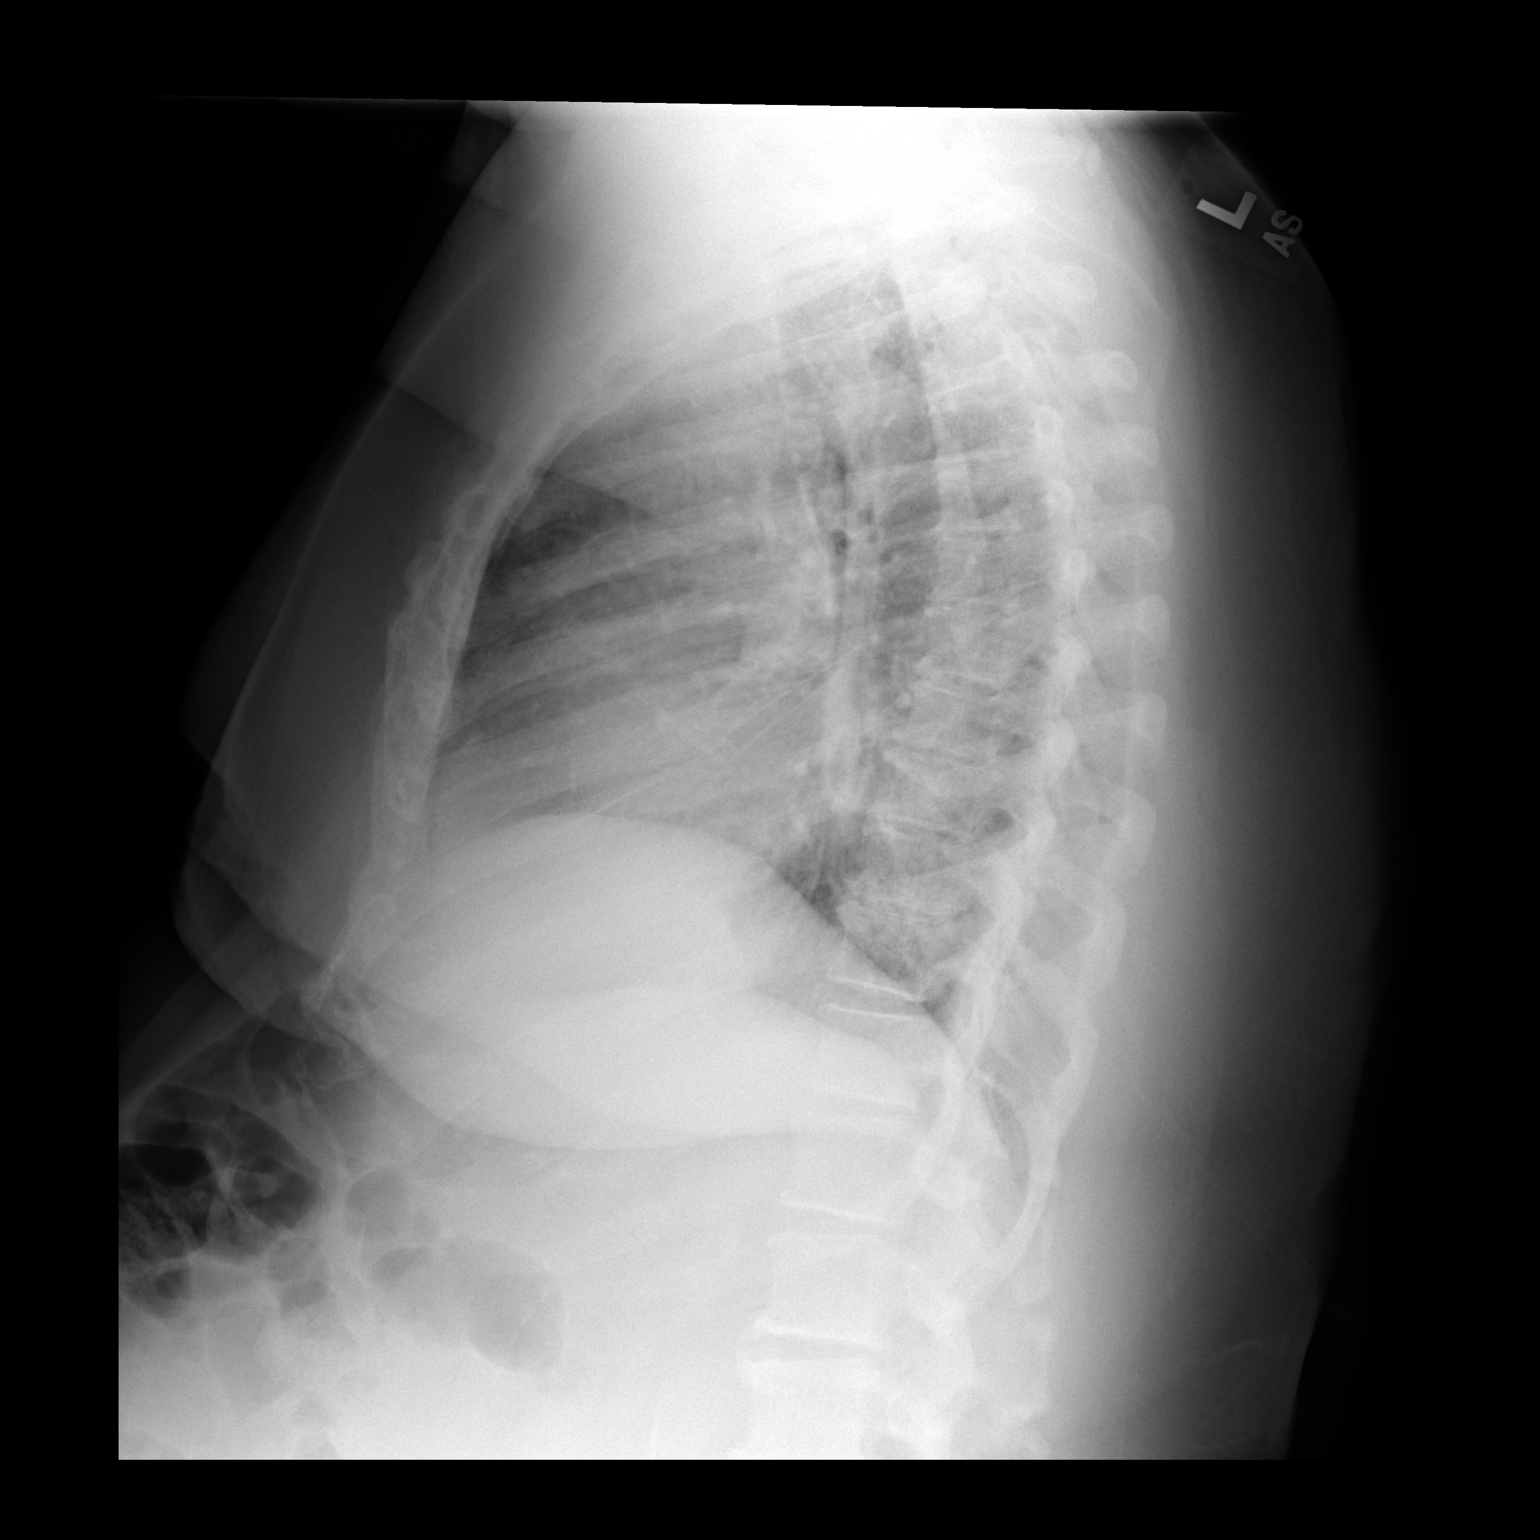

[2 of 2 positions shown; findings below may reference images not displayed]

FINDINGS: Stable cardiomediastinal silhouette. Mild central pulmonary vascular
congestion is noted. No pneumothorax or pleural effusion is noted.
Both lungs are clear. The visualized skeletal structures are
unremarkable.
IMPRESSION: Stable central pulmonary vascular congestion. No significant change
compared to prior exam.

## 2020-02-13 ENCOUNTER — Ambulatory Visit (INDEPENDENT_AMBULATORY_CARE_PROVIDER_SITE_OTHER): Payer: Medicare Other | Admitting: Adult Health

## 2020-02-13 ENCOUNTER — Ambulatory Visit: Payer: Medicare Other | Admitting: Pulmonary Disease

## 2020-02-13 ENCOUNTER — Encounter: Payer: Self-pay | Admitting: Adult Health

## 2020-02-13 ENCOUNTER — Other Ambulatory Visit: Payer: Self-pay

## 2020-02-13 VITALS — BP 126/58 | HR 68 | Temp 98.7°F | Ht 59.75 in | Wt 223.8 lb

## 2020-02-13 DIAGNOSIS — J4530 Mild persistent asthma, uncomplicated: Secondary | ICD-10-CM

## 2020-02-13 DIAGNOSIS — J9611 Chronic respiratory failure with hypoxia: Secondary | ICD-10-CM

## 2020-02-13 DIAGNOSIS — J0101 Acute recurrent maxillary sinusitis: Secondary | ICD-10-CM

## 2020-02-13 MED ORDER — CEFDINIR 300 MG PO CAPS: 300.0000 mg | ORAL_CAPSULE | Freq: Two times a day (BID) | ORAL | 0 refills | Status: DC

## 2020-02-13 NOTE — Assessment & Plan Note (Signed)
Acute sinusitis.  Treat with antibiotics.  Saline nasal rinses.  Plan  Patient Instructions  Omnicef 300mg  Twice daily  For 7 days , take with food  Saline nasal rinses As needed   Mucinex DM Twice daily  As needed  Cough/congestion .   Continue on BREO 1 puff At bedtime  .  Continue on Singulair daily  Albuterol inhaler As needed    May discontinue Oxygen .   Follow up with Dr. Vaughan Browner in 6 months and As needed   Please contact office for sooner follow up if symptoms do not improve or worsen or seek emergency care

## 2020-02-13 NOTE — Progress Notes (Signed)
@Patient  ID: Amanda Cain, female    DOB: 02/18/1946, 74 y.o.   MRN: [id number]  Chief Complaint  Patient presents with  . Follow-up    Referring provider: Mayra Neer, MD  HPI: 74 year old female followed for mild persistent asthma and oxygen dependent respiratory failure with oxygen with activity. Medical history significant for pulmonary hypertension, diastolic heart failure, chronic kidney disease.  TEST/EVENTS :  Right heart catheterization in July 2019 showed moderately to severely elevated left and right heart filling pressures and pulmonary hypertension.  02/13/2020 Follow up: Asthma  Patient returns for a follow-up visit.  Last seen in December 2020 via telemedicine visit. Patient has underlying mild persistent asthma.  She remains on Breo daily.  She says overall breathing has been doing well.  She denies any flare of her cough or wheezing.  Patient does complain over the last week she has had increased sinus congestion sinus pain pressure mild teeth pain and blowing out discolored nasal discharge.  She is requesting antibiotic for sinus infection. She has been using Astelin and remains on Singulair daily.  Patient has diastolic heart failure followed by cardiology.  She is on diuretics.  She denies any increased leg swelling.  No increased shortness of breath.  She is quite sedentary and gets short of breath with activity.  She has known pulmonary hypertension.  Patient does have oxygen that she is supposed to use with activity but she does not use this on a regular basis.  She is adamant that she wants this discontinued and for the DME company come pick it up.  Walk test in the office did not show desaturations today with O2 saturations remaining above 93% on room air. Discussion with patient regarding oxygen needs and potential as she has underlying pulmonary hypertension however she says she wants the oxygen to be discontinued.  Covid vaccines are up-to-date.   Patient has a planned flu shot in 2 weeks.  Allergies  Allergen Reactions  . Amlodipine     Dizziness per patient  . Aspirin Other (See Comments)    " Have an ulcer"    Immunization History  Administered Date(s) Administered  . Influenza Split 02/28/2016  . Influenza, High Dose Seasonal PF 03/12/2018, 01/30/2019  . Influenza,inj,Quad PF,6+ Mos 03/03/2015, 02/27/2017  . PFIZER SARS-COV-2 Vaccination 08/04/2019, 09/01/2019  . Pneumococcal Conjugate-13 02/28/2016  . Pneumococcal Polysaccharide-23 12/11/2018    Past Medical History:  Diagnosis Date  . Anemia   . Anxiety    severe  . Arthritis   . Bronchitis    hx of  . CHF (congestive heart failure) ([place])   . Chronic kidney disease    "kidney disease stage 3"  . Depression   . Diabetes mellitus   . GERD (gastroesophageal reflux disease)   . Hx of gallstones   . Hypercholesteremia   . Hypertension   . Hypothyroidism   . Obesity     Tobacco History: Social History   Tobacco Use  Smoking Status Never Smoker  Smokeless Tobacco Never Used   Counseling given: Not Answered   Outpatient Medications Prior to Visit  Medication Sig Dispense Refill  . acetaminophen (TYLENOL) 500 MG tablet Take 500 mg by mouth daily as needed for mild pain.     Marland Kitchen albuterol (PROVENTIL HFA;VENTOLIN HFA) 108 (90 Base) MCG/ACT inhaler Inhale 2 puffs into the lungs every 4 (four) hours as needed for wheezing or shortness of breath.    Marland Kitchen atorvastatin (LIPITOR) 80 MG tablet Take 80 mg by mouth  daily.    . azelastine (ASTELIN) 0.1 % nasal spray Place 1 spray into both nostrils 2 (two) times daily. Use in each nostril as directed    . benzonatate (TESSALON) 100 MG capsule Take 100 mg by mouth 3 (three) times daily.    . Biotin 1000 MCG tablet Take 1,000 mcg by mouth daily.    Marland Kitchen BREO ELLIPTA 200-25 MCG/INH AEPB INHALE 1 PUFF INTO THE LUNGS DAILY 60 each 1  . cholecalciferol (VITAMIN D) 1000 units tablet Take 1,000 Units by mouth daily.    Marland Kitchen  diltiazem (CARDIZEM CD) 360 MG 24 hr capsule Take 1 capsule by mouth daily.  0  . EPOETIN ALFA IJ Inject 1 application as directed every 30 (thirty) days.    Marland Kitchen escitalopram (LEXAPRO) 10 MG tablet Take 10 mg by mouth at bedtime.    Marland Kitchen estradiol (ESTRACE) 0.5 MG tablet Take 0.5 mg by mouth every morning.     . ezetimibe (ZETIA) 10 MG tablet Take 10 mg by mouth daily.    . ferrous sulfate 325 (65 FE) MG tablet Take 325 mg by mouth daily with breakfast.    . fluticasone (FLONASE) 50 MCG/ACT nasal spray Place 2 sprays into both nostrils daily.    Marland Kitchen glimepiride (AMARYL) 1 MG tablet Take 1 mg by mouth daily with breakfast.   1  . guaiFENesin (MUCINEX) 600 MG 12 hr tablet Take 600 mg by mouth 2 (two) times daily.    . Lancets (ONETOUCH ULTRASOFT) lancets SMARTSIG:1 Lancet(s) Via Meter Daily    . levothyroxine (SYNTHROID) 200 MCG tablet Take 200 mcg by mouth every morning.    Marland Kitchen LORazepam (ATIVAN) 0.5 MG tablet     . metoprolol succinate (TOPROL-XL) 100 MG 24 hr tablet Take 100 mg by mouth every morning.   0  . montelukast (SINGULAIR) 10 MG tablet TAKE 1 TABLET(10 MG) BY MOUTH AT BEDTIME 30 tablet 2  . montelukast (SINGULAIR) 10 MG tablet TAKE 1 TABLET(10 MG) BY MOUTH AT BEDTIME 30 tablet 2  . Multiple Vitamin (MULITIVITAMIN WITH MINERALS) TABS Take 1 tablet by mouth daily.    Marland Kitchen nystatin cream (MYCOSTATIN) APP EXT AA BID FOR 7 DAYS PRN    . ofloxacin (OCUFLOX) 0.3 % ophthalmic solution Place 1 drop into the right eye 4 (four) times daily.    . ondansetron (ZOFRAN-ODT) 4 MG disintegrating tablet Take 4 mg by mouth every 8 (eight) hours as needed for nausea or vomiting.    . paliperidone (INVEGA) 3 MG 24 hr tablet Take 3 mg by mouth at bedtime.    . pantoprazole (PROTONIX) 40 MG tablet TAKE 1 TABLET(40 MG) BY MOUTH DAILY 90 tablet 0  . Polyethyl Glycol-Propyl Glycol (SYSTANE) 0.4-0.3 % SOLN Apply 1 drop to eye daily as needed (for dry eyes).    . potassium chloride SA (KLOR-CON) 20 MEQ tablet Take 1 tablet  (20 mEq total) by mouth 2 (two) times daily. 60 tablet 9  . prednisoLONE acetate (PRED FORTE) 1 % ophthalmic suspension Place 1 drop into the right eye 4 (four) times daily.    . Promethazine-Codeine 6.25-10 MG/5ML SOLN Take 5 mLs by mouth every 6 (six) hours as needed.    . saccharomyces boulardii (FLORASTOR) 250 MG capsule Take 1 capsule (250 mg total) by mouth 2 (two) times daily. 30 capsule 0  . sitaGLIPtin (JANUVIA) 50 MG tablet Take 50 mg by mouth daily.    . sucralfate (CARAFATE) 1 G tablet Take 1 g by mouth 2 (two) times  daily.  0  . torsemide (DEMADEX) 100 MG tablet Take 100 mg by mouth daily.    Marland Kitchen triamcinolone ointment (KENALOG) 0.5 % APP EXT AA BID FOR 7 DAYS UTD THEN PRN EXT    . vitamin B-12 (CYANOCOBALAMIN) 1000 MCG tablet Take 1,000 mcg by mouth daily.    . vitamin C (ASCORBIC ACID) 500 MG tablet Take 500 mg by mouth daily.    . hydrALAZINE (APRESOLINE) 25 MG tablet Take 1 tablet (25 mg total) by mouth every 8 (eight) hours. 90 tablet 0   No facility-administered medications prior to visit.     Review of Systems:   Constitutional:   No  weight loss, night sweats,  Fevers, chills,  +fatigue, or  lassitude.  HEENT:   No headaches,  Difficulty swallowing,  Tooth/dental problems, or  Sore throat,                No sneezing, itching, ear ache, nasal congestion, post nasal drip,   CV:  No chest pain,  Orthopnea, PND, swelling in lower extremities, anasarca, dizziness, palpitations, syncope.   GI  No heartburn, indigestion, abdominal pain, nausea, vomiting, diarrhea, change in bowel habits, loss of appetite, bloody stools.   Resp: .  No chest wall deformity  Skin: no rash or lesions.  GU: no dysuria, change in color of urine, no urgency or frequency.  No flank pain, no hematuria   MS:  No joint pain or swelling.  No decreased range of motion.  No back pain.    Physical Exam  BP (!) 126/58 (BP Location: Left Arm, Cuff Size: Normal)   Pulse 68   Temp 98.7 F (37.1 C)  (Temporal)   Ht 4' 11.75" (1.518 m)   Wt 223 lb 12.8 oz (101.5 kg)   SpO2 100% Comment: RA  BMI 44.07 kg/m   GEN: A/Ox3; pleasant , NAD, well nourished    HEENT:  /AT,    NOSE-clear discharge, maxillary sinus tenderness , THROAT-clear, no lesions, no postnasal drip or exudate noted.   NECK:  Supple w/ fair ROM; no JVD; normal carotid impulses w/o bruits; no thyromegaly or nodules palpated; no lymphadenopathy.    RESP  Clear  P & A; w/o, wheezes/ rales/ or rhonchi. no accessory muscle use, no dullness to percussion  CARD:  RRR, no m/r/g, 1+ peripheral edema, pulses intact, no cyanosis or clubbing.  GI:   Soft & nt; nml bowel sounds; no organomegaly or masses detected.   Musco: Warm bil, no deformities or joint swelling noted.   Neuro: alert, no focal deficits noted.    Skin: Warm, no lesions or rashes     BMET  Imaging: No results found.    No flowsheet data found.  No results found for: NITRICOXIDE      Assessment & Plan:   Acute recurrent maxillary sinusitis Acute sinusitis.  Treat with antibiotics.  Saline nasal rinses.  Plan  Patient Instructions  Omnicef 300mg  Twice daily  For 7 days , take with food  Saline nasal rinses As needed   Mucinex DM Twice daily  As needed  Cough/congestion .   Continue on BREO 1 puff At bedtime  .  Continue on Singulair daily  Albuterol inhaler As needed    May discontinue Oxygen .   Follow up with Dr. Vaughan Browner in 6 months and As needed   Please contact office for sooner follow up if symptoms do not improve or worsen or seek emergency care  Chronic respiratory failure with hypoxia (HCC) No desaturations with ambulation. Discussion with patient regarding potential need for oxygen as she has underlying pulmonary hypertension and diastolic heart failure.  However patient is adamant she wants oxygen discontinued.  O2 DC order sent to DME  Plan  Patient Instructions  Omnicef 300mg  Twice daily  For 7 days , take  with food  Saline nasal rinses As needed   Mucinex DM Twice daily  As needed  Cough/congestion .   Continue on BREO 1 puff At bedtime  .  Continue on Singulair daily  Albuterol inhaler As needed    May discontinue Oxygen .   Follow up with Dr. Vaughan Browner in 6 months and As needed   Please contact office for sooner follow up if symptoms do not improve or worsen or seek emergency care          Rexene Edison, NP 02/13/2020

## 2020-02-13 NOTE — Assessment & Plan Note (Signed)
No desaturations with ambulation. Discussion with patient regarding potential need for oxygen as she has underlying pulmonary hypertension and diastolic heart failure.  However patient is adamant she wants oxygen discontinued.  O2 DC order sent to DME  Plan  Patient Instructions  Omnicef 300mg  Twice daily  For 7 days , take with food  Saline nasal rinses As needed   Mucinex DM Twice daily  As needed  Cough/congestion .   Continue on BREO 1 puff At bedtime  .  Continue on Singulair daily  Albuterol inhaler As needed    May discontinue Oxygen .   Follow up with Dr. Vaughan Browner in 6 months and As needed   Please contact office for sooner follow up if symptoms do not improve or worsen or seek emergency care

## 2020-02-13 NOTE — Patient Instructions (Addendum)
Omnicef 300mg  Twice daily  For 7 days , take with food  Saline nasal rinses As needed   Mucinex DM Twice daily  As needed  Cough/congestion .   Continue on BREO 1 puff At bedtime  .  Continue on Singulair daily  Albuterol inhaler As needed    May discontinue Oxygen .   Follow up with Dr. Vaughan Browner in 6 months and As needed   Please contact office for sooner follow up if symptoms do not improve or worsen or seek emergency care

## 2020-02-15 ENCOUNTER — Other Ambulatory Visit: Payer: Self-pay | Admitting: Pulmonary Disease

## 2020-02-18 ENCOUNTER — Telehealth: Payer: Self-pay | Admitting: Interventional Cardiology

## 2020-02-18 NOTE — Telephone Encounter (Signed)
    Pt c/o swelling: STAT is pt has developed SOB within 24 hours  1) How much weight have you gained and in what time span? 4 lbs in a week  2) If swelling, where is the swelling located? Legs and feet  3) Are you currently taking a fluid pill? Yes  4) Are you currently SOB? No  5) Do you have a log of your daily weights (if so, list)?   6) Have you gained 3 pounds in a day or 5 pounds in a week? 4 lbs in a week  7) Have you traveled recently? No  Pt said she is taking 1 and a half tablet of her fluid pill and still no improvement, her legs and feet is still swollen and gained 4 lbs in a week

## 2020-02-18 NOTE — Telephone Encounter (Signed)
Called and spoke to patient and she states that she is having increased swelling in her legs and feet. She states that her weight is up 4 lbs this week. She has had some SOB,. She has admitted to eating foods that are high in salt. She is taking torsemide 100 mg QD. I have asked her to take and extra 1/2 tablet today and tomorrow to see if that helps with her swelling. I have also instructed the patient to avoid salty foods, elevate legs, and wear compression stockings to assist with swelling. She has an appointment with Dr. Irish Lack on 9/23.
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
## 2020-02-20 ENCOUNTER — Encounter (HOSPITAL_COMMUNITY): Payer: Self-pay | Admitting: Emergency Medicine

## 2020-02-20 ENCOUNTER — Ambulatory Visit: Payer: Medicare Other | Admitting: Interventional Cardiology

## 2020-02-20 ENCOUNTER — Inpatient Hospital Stay (HOSPITAL_COMMUNITY)
Admission: EM | Admit: 2020-02-20 | Discharge: 2020-02-26 | DRG: 291 | Disposition: A | Discharge status: Home Health | Payer: Medicare Other | Attending: Internal Medicine | Admitting: Internal Medicine

## 2020-02-20 ENCOUNTER — Other Ambulatory Visit: Payer: Self-pay

## 2020-02-20 ENCOUNTER — Emergency Department (HOSPITAL_COMMUNITY): Payer: Medicare Other

## 2020-02-20 ENCOUNTER — Telehealth: Payer: Self-pay | Admitting: Interventional Cardiology

## 2020-02-20 DIAGNOSIS — Z96653 Presence of artificial knee joint, bilateral: Secondary | ICD-10-CM | POA: Diagnosis present

## 2020-02-20 DIAGNOSIS — Z8041 Family history of malignant neoplasm of ovary: Secondary | ICD-10-CM

## 2020-02-20 DIAGNOSIS — J329 Chronic sinusitis, unspecified: Secondary | ICD-10-CM | POA: Diagnosis present

## 2020-02-20 DIAGNOSIS — Z7984 Long term (current) use of oral hypoglycemic drugs: Secondary | ICD-10-CM

## 2020-02-20 DIAGNOSIS — E1122 Type 2 diabetes mellitus with diabetic chronic kidney disease: Secondary | ICD-10-CM | POA: Diagnosis present

## 2020-02-20 DIAGNOSIS — J4531 Mild persistent asthma with (acute) exacerbation: Secondary | ICD-10-CM | POA: Diagnosis present

## 2020-02-20 DIAGNOSIS — Z23 Encounter for immunization: Secondary | ICD-10-CM | POA: Diagnosis present

## 2020-02-20 DIAGNOSIS — J45901 Unspecified asthma with (acute) exacerbation: Secondary | ICD-10-CM

## 2020-02-20 DIAGNOSIS — Z79890 Hormone replacement therapy (postmenopausal): Secondary | ICD-10-CM

## 2020-02-20 DIAGNOSIS — Z20822 Contact with and (suspected) exposure to covid-19: Secondary | ICD-10-CM | POA: Diagnosis present

## 2020-02-20 DIAGNOSIS — E871 Hypo-osmolality and hyponatremia: Secondary | ICD-10-CM | POA: Diagnosis present

## 2020-02-20 DIAGNOSIS — Z79899 Other long term (current) drug therapy: Secondary | ICD-10-CM

## 2020-02-20 DIAGNOSIS — I50811 Acute right heart failure: Secondary | ICD-10-CM

## 2020-02-20 DIAGNOSIS — Z9981 Dependence on supplemental oxygen: Secondary | ICD-10-CM

## 2020-02-20 DIAGNOSIS — I130 Hypertensive heart and chronic kidney disease with heart failure and stage 1 through stage 4 chronic kidney disease, or unspecified chronic kidney disease: Principal | ICD-10-CM | POA: Diagnosis present

## 2020-02-20 DIAGNOSIS — J9621 Acute and chronic respiratory failure with hypoxia: Secondary | ICD-10-CM | POA: Diagnosis present

## 2020-02-20 DIAGNOSIS — Z8042 Family history of malignant neoplasm of prostate: Secondary | ICD-10-CM

## 2020-02-20 DIAGNOSIS — Z886 Allergy status to analgesic agent status: Secondary | ICD-10-CM

## 2020-02-20 DIAGNOSIS — Z823 Family history of stroke: Secondary | ICD-10-CM

## 2020-02-20 DIAGNOSIS — F329 Major depressive disorder, single episode, unspecified: Secondary | ICD-10-CM | POA: Diagnosis present

## 2020-02-20 DIAGNOSIS — K219 Gastro-esophageal reflux disease without esophagitis: Secondary | ICD-10-CM | POA: Diagnosis present

## 2020-02-20 DIAGNOSIS — E876 Hypokalemia: Secondary | ICD-10-CM | POA: Diagnosis present

## 2020-02-20 DIAGNOSIS — J45909 Unspecified asthma, uncomplicated: Secondary | ICD-10-CM | POA: Diagnosis present

## 2020-02-20 DIAGNOSIS — E039 Hypothyroidism, unspecified: Secondary | ICD-10-CM | POA: Diagnosis present

## 2020-02-20 DIAGNOSIS — N184 Chronic kidney disease, stage 4 (severe): Secondary | ICD-10-CM | POA: Diagnosis present

## 2020-02-20 DIAGNOSIS — R0602 Shortness of breath: Secondary | ICD-10-CM

## 2020-02-20 DIAGNOSIS — Z90710 Acquired absence of both cervix and uterus: Secondary | ICD-10-CM

## 2020-02-20 DIAGNOSIS — F419 Anxiety disorder, unspecified: Secondary | ICD-10-CM | POA: Diagnosis present

## 2020-02-20 DIAGNOSIS — I5033 Acute on chronic diastolic (congestive) heart failure: Secondary | ICD-10-CM | POA: Diagnosis present

## 2020-02-20 DIAGNOSIS — E785 Hyperlipidemia, unspecified: Secondary | ICD-10-CM | POA: Diagnosis present

## 2020-02-20 DIAGNOSIS — D631 Anemia in chronic kidney disease: Secondary | ICD-10-CM | POA: Diagnosis present

## 2020-02-20 DIAGNOSIS — J4540 Moderate persistent asthma, uncomplicated: Secondary | ICD-10-CM | POA: Diagnosis not present

## 2020-02-20 DIAGNOSIS — J42 Unspecified chronic bronchitis: Secondary | ICD-10-CM | POA: Diagnosis present

## 2020-02-20 DIAGNOSIS — E7800 Pure hypercholesterolemia, unspecified: Secondary | ICD-10-CM | POA: Diagnosis present

## 2020-02-20 DIAGNOSIS — Z6841 Body Mass Index (BMI) 40.0 and over, adult: Secondary | ICD-10-CM | POA: Diagnosis not present

## 2020-02-20 DIAGNOSIS — Z820 Family history of epilepsy and other diseases of the nervous system: Secondary | ICD-10-CM

## 2020-02-20 DIAGNOSIS — Z8249 Family history of ischemic heart disease and other diseases of the circulatory system: Secondary | ICD-10-CM

## 2020-02-20 DIAGNOSIS — Z888 Allergy status to other drugs, medicaments and biological substances status: Secondary | ICD-10-CM

## 2020-02-20 DIAGNOSIS — I10 Essential (primary) hypertension: Secondary | ICD-10-CM | POA: Diagnosis present

## 2020-02-20 DIAGNOSIS — Z7951 Long term (current) use of inhaled steroids: Secondary | ICD-10-CM

## 2020-02-20 DIAGNOSIS — N183 Chronic kidney disease, stage 3 unspecified: Secondary | ICD-10-CM | POA: Diagnosis present

## 2020-02-20 DIAGNOSIS — N179 Acute kidney failure, unspecified: Secondary | ICD-10-CM

## 2020-02-20 LAB — CBC
HCT: 32.4 % — ABNORMAL LOW (ref 36.0–46.0)
Hemoglobin: 9.8 g/dL — ABNORMAL LOW (ref 12.0–15.0)
MCH: 27 pg (ref 26.0–34.0)
MCHC: 30.2 g/dL (ref 30.0–36.0)
MCV: 89.3 fL (ref 80.0–100.0)
Platelets: 292 10*3/uL (ref 150–400)
RBC: 3.63 MIL/uL — ABNORMAL LOW (ref 3.87–5.11)
RDW: 15.8 % — ABNORMAL HIGH (ref 11.5–15.5)
WBC: 9.3 10*3/uL (ref 4.0–10.5)
nRBC: 0 % (ref 0.0–0.2)

## 2020-02-20 LAB — RESPIRATORY PANEL BY RT PCR (FLU A&B, COVID)
Influenza A by PCR: NEGATIVE
Influenza B by PCR: NEGATIVE
SARS Coronavirus 2 by RT PCR: NEGATIVE

## 2020-02-20 LAB — MAGNESIUM: Magnesium: 1.9 mg/dL (ref 1.7–2.4)

## 2020-02-20 LAB — BASIC METABOLIC PANEL
Anion gap: 9 (ref 5–15)
BUN: 56 mg/dL — ABNORMAL HIGH (ref 8–23)
CO2: 28 mmol/L (ref 22–32)
Calcium: 9 mg/dL (ref 8.9–10.3)
Chloride: 97 mmol/L — ABNORMAL LOW (ref 98–111)
Creatinine, Ser: 2.62 mg/dL — ABNORMAL HIGH (ref 0.44–1.00)
GFR calc Af Amer: 20 mL/min — ABNORMAL LOW (ref >60)
GFR calc non Af Amer: 17 mL/min — ABNORMAL LOW (ref >60)
Glucose, Bld: 214 mg/dL — ABNORMAL HIGH (ref 70–99)
Potassium: 3.3 mmol/L — ABNORMAL LOW (ref 3.5–5.1)
Sodium: 134 mmol/L — ABNORMAL LOW (ref 135–145)

## 2020-02-20 LAB — CREATININE, SERUM
Creatinine, Ser: 2.72 mg/dL — ABNORMAL HIGH (ref 0.44–1.00)
GFR calc Af Amer: 19 mL/min — ABNORMAL LOW (ref >60)
GFR calc non Af Amer: 16 mL/min — ABNORMAL LOW (ref >60)

## 2020-02-20 LAB — APTT: aPTT: 38 seconds — ABNORMAL HIGH (ref 24–36)

## 2020-02-20 LAB — CBG MONITORING, ED
Glucose-Capillary: 276 mg/dL — ABNORMAL HIGH (ref 70–99)
Glucose-Capillary: 300 mg/dL — ABNORMAL HIGH (ref 70–99)

## 2020-02-20 LAB — TROPONIN I (HIGH SENSITIVITY)
Troponin I (High Sensitivity): 18 ng/L — ABNORMAL HIGH (ref <18)
Troponin I (High Sensitivity): 17 ng/L (ref <18)

## 2020-02-20 LAB — BRAIN NATRIURETIC PEPTIDE: B Natriuretic Peptide: 119.7 pg/mL — ABNORMAL HIGH (ref 0.0–100.0)

## 2020-02-20 LAB — TSH: TSH: 0.549 u[IU]/mL (ref 0.350–4.500)

## 2020-02-20 LAB — PROTIME-INR
INR: 1.2 (ref 0.8–1.2)
Prothrombin Time: 14.7 seconds (ref 11.4–15.2)

## 2020-02-20 MED ORDER — IPRATROPIUM BROMIDE HFA 17 MCG/ACT IN AERS
2.0000 | INHALATION_SPRAY | Freq: Once | RESPIRATORY_TRACT | Status: AC
  Administered 2020-02-20: 2 via RESPIRATORY_TRACT
  Filled 2020-02-20: qty 12.9

## 2020-02-20 MED ORDER — HEPARIN SODIUM (PORCINE) 5000 UNIT/ML IJ SOLN
5000.0000 [IU] | Freq: Three times a day (TID) | INTRAMUSCULAR | Status: DC
  Administered 2020-02-20 – 2020-02-26 (×18): 5000 [IU] via SUBCUTANEOUS
  Filled 2020-02-20 (×17): qty 1

## 2020-02-20 MED ORDER — INSULIN ASPART 100 UNIT/ML ~~LOC~~ SOLN
0.0000 [IU] | Freq: Three times a day (TID) | SUBCUTANEOUS | Status: DC
  Administered 2020-02-20: 5 [IU] via SUBCUTANEOUS
  Administered 2020-02-21: 3 [IU] via SUBCUTANEOUS
  Administered 2020-02-21: 5 [IU] via SUBCUTANEOUS

## 2020-02-20 MED ORDER — ALBUTEROL SULFATE (2.5 MG/3ML) 0.083% IN NEBU
2.5000 mg | INHALATION_SOLUTION | Freq: Four times a day (QID) | RESPIRATORY_TRACT | Status: DC
  Administered 2020-02-21: 2.5 mg via RESPIRATORY_TRACT
  Filled 2020-02-20: qty 3

## 2020-02-20 MED ORDER — FUROSEMIDE 10 MG/ML IJ SOLN
40.0000 mg | INTRAMUSCULAR | Status: DC
  Administered 2020-02-21: 40 mg via INTRAVENOUS
  Filled 2020-02-20: qty 4

## 2020-02-20 MED ORDER — HYDRALAZINE HCL 25 MG PO TABS
25.0000 mg | ORAL_TABLET | Freq: Three times a day (TID) | ORAL | Status: DC
  Administered 2020-02-20 – 2020-02-26 (×18): 25 mg via ORAL
  Filled 2020-02-20 (×18): qty 1

## 2020-02-20 MED ORDER — INSULIN ASPART 100 UNIT/ML ~~LOC~~ SOLN
0.0000 [IU] | Freq: Every day | SUBCUTANEOUS | Status: DC
  Administered 2020-02-21: 3 [IU] via SUBCUTANEOUS

## 2020-02-20 MED ORDER — INSULIN GLARGINE 100 UNIT/ML ~~LOC~~ SOLN
14.0000 [IU] | Freq: Every day | SUBCUTANEOUS | Status: DC
  Filled 2020-02-20 (×2): qty 0.14

## 2020-02-20 MED ORDER — SODIUM CHLORIDE 0.9% FLUSH
3.0000 mL | Freq: Two times a day (BID) | INTRAVENOUS | Status: DC
  Administered 2020-02-20 – 2020-02-26 (×13): 3 mL via INTRAVENOUS

## 2020-02-20 MED ORDER — LEVOTHYROXINE SODIUM 100 MCG PO TABS
200.0000 ug | ORAL_TABLET | ORAL | Status: DC
  Administered 2020-02-21 – 2020-02-26 (×6): 200 ug via ORAL
  Filled 2020-02-20 (×6): qty 2

## 2020-02-20 MED ORDER — POTASSIUM CHLORIDE 20 MEQ PO PACK
40.0000 meq | PACK | Freq: Once | ORAL | Status: AC
  Administered 2020-02-20: 40 meq via ORAL
  Filled 2020-02-20: qty 2

## 2020-02-20 MED ORDER — SODIUM CHLORIDE 0.9 % IV SOLN: 250.0000 mL | INTRAVENOUS | Status: DC | PRN

## 2020-02-20 MED ORDER — SODIUM CHLORIDE 0.9% FLUSH: 3.0000 mL | INTRAVENOUS | Status: DC | PRN

## 2020-02-20 MED ORDER — FLUTICASONE FUROATE-VILANTEROL 200-25 MCG/INH IN AEPB
1.0000 | INHALATION_SPRAY | Freq: Every day | RESPIRATORY_TRACT | Status: DC
  Administered 2020-02-22 – 2020-02-25 (×4): 1 via RESPIRATORY_TRACT
  Filled 2020-02-20 (×2): qty 28

## 2020-02-20 MED ORDER — METHYLPREDNISOLONE SODIUM SUCC 125 MG IJ SOLR
125.0000 mg | Freq: Once | INTRAMUSCULAR | Status: AC
  Administered 2020-02-20: 125 mg via INTRAVENOUS
  Filled 2020-02-20: qty 2

## 2020-02-20 MED ORDER — ESCITALOPRAM OXALATE 20 MG PO TABS
20.0000 mg | ORAL_TABLET | Freq: Every day | ORAL | Status: DC
  Administered 2020-02-21 – 2020-02-25 (×6): 20 mg via ORAL
  Filled 2020-02-20 (×6): qty 1

## 2020-02-20 MED ORDER — ONDANSETRON HCL 4 MG/2ML IJ SOLN
4.0000 mg | Freq: Four times a day (QID) | INTRAMUSCULAR | Status: DC | PRN
  Administered 2020-02-25: 4 mg via INTRAVENOUS
  Filled 2020-02-20: qty 2

## 2020-02-20 MED ORDER — LORAZEPAM 0.5 MG PO TABS
0.5000 mg | ORAL_TABLET | Freq: Every day | ORAL | Status: DC
  Administered 2020-02-20 – 2020-02-21 (×3): 0.5 mg via ORAL
  Filled 2020-02-20 (×4): qty 1

## 2020-02-20 MED ORDER — FUROSEMIDE 10 MG/ML IJ SOLN
60.0000 mg | INTRAMUSCULAR | Status: DC
  Administered 2020-02-21 – 2020-02-22 (×2): 60 mg via INTRAVENOUS
  Filled 2020-02-20 (×2): qty 6

## 2020-02-20 MED ORDER — SUCRALFATE 1 G PO TABS
1.0000 g | ORAL_TABLET | Freq: Two times a day (BID) | ORAL | Status: DC
  Administered 2020-02-21 – 2020-02-26 (×11): 1 g via ORAL
  Filled 2020-02-20 (×13): qty 1

## 2020-02-20 MED ORDER — ACETAMINOPHEN 325 MG PO TABS
650.0000 mg | ORAL_TABLET | ORAL | Status: DC | PRN
  Administered 2020-02-20 – 2020-02-25 (×2): 650 mg via ORAL
  Filled 2020-02-20 (×2): qty 2

## 2020-02-20 MED ORDER — METOPROLOL SUCCINATE ER 100 MG PO TB24
100.0000 mg | ORAL_TABLET | Freq: Every morning | ORAL | Status: DC
  Administered 2020-02-20 – 2020-02-26 (×7): 100 mg via ORAL
  Filled 2020-02-20 (×7): qty 1

## 2020-02-20 MED ORDER — ALBUTEROL SULFATE HFA 108 (90 BASE) MCG/ACT IN AERS
6.0000 | INHALATION_SPRAY | Freq: Once | RESPIRATORY_TRACT | Status: AC
  Administered 2020-02-20: 6 via RESPIRATORY_TRACT
  Filled 2020-02-20: qty 6.7

## 2020-02-20 MED ORDER — EZETIMIBE 10 MG PO TABS
10.0000 mg | ORAL_TABLET | Freq: Every day | ORAL | Status: DC
  Administered 2020-02-21 – 2020-02-26 (×6): 10 mg via ORAL
  Filled 2020-02-20 (×7): qty 1

## 2020-02-20 MED ORDER — ALBUTEROL SULFATE (2.5 MG/3ML) 0.083% IN NEBU
2.5000 mg | INHALATION_SOLUTION | RESPIRATORY_TRACT | Status: DC
  Administered 2020-02-20 (×3): 2.5 mg via RESPIRATORY_TRACT
  Filled 2020-02-20 (×2): qty 3

## 2020-02-20 MED ORDER — FUROSEMIDE 10 MG/ML IJ SOLN
40.0000 mg | Freq: Once | INTRAMUSCULAR | Status: AC
  Administered 2020-02-20: 40 mg via INTRAVENOUS
  Filled 2020-02-20: qty 4

## 2020-02-20 MED ORDER — ATORVASTATIN CALCIUM 80 MG PO TABS
80.0000 mg | ORAL_TABLET | Freq: Every day | ORAL | Status: DC
  Administered 2020-02-20 – 2020-02-26 (×7): 80 mg via ORAL
  Filled 2020-02-20 (×7): qty 1

## 2020-02-20 MED ORDER — METHYLPREDNISOLONE SODIUM SUCC 125 MG IJ SOLR
80.0000 mg | Freq: Two times a day (BID) | INTRAMUSCULAR | Status: DC
  Administered 2020-02-20 – 2020-02-22 (×4): 80 mg via INTRAVENOUS
  Filled 2020-02-20 (×4): qty 2

## 2020-02-20 MED ORDER — PANTOPRAZOLE SODIUM 40 MG PO TBEC
40.0000 mg | DELAYED_RELEASE_TABLET | Freq: Every day | ORAL | Status: DC
  Administered 2020-02-21 – 2020-02-26 (×6): 40 mg via ORAL
  Filled 2020-02-20 (×7): qty 1

## 2020-02-20 MED ORDER — ALBUTEROL SULFATE (2.5 MG/3ML) 0.083% IN NEBU: 2.5000 mg | INHALATION_SOLUTION | RESPIRATORY_TRACT | Status: DC | PRN

## 2020-02-20 MED ORDER — DILTIAZEM HCL ER COATED BEADS 180 MG PO CP24
360.0000 mg | ORAL_CAPSULE | Freq: Every day | ORAL | Status: DC
  Administered 2020-02-21 – 2020-02-26 (×6): 360 mg via ORAL
  Filled 2020-02-20 (×3): qty 2
  Filled 2020-02-20: qty 1
  Filled 2020-02-20 (×3): qty 2

## 2020-02-20 MED ORDER — FUROSEMIDE 10 MG/ML IJ SOLN: 40.0000 mg | Freq: Two times a day (BID) | INTRAMUSCULAR | Status: DC

## 2020-02-20 NOTE — Telephone Encounter (Signed)
Patient called about her appt today she canceled it because she is not feeling well.  She wanted to let [place] know that she is going to the hospital.
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
## 2020-02-20 NOTE — ED Triage Notes (Signed)
Pt to triage via [facility] from home.  Reports SOB and bilateral lower extremity edema.  Pt taking antibiotic for sinus infection.  Expiratory wheezing on EMS arrival.  They had pt use her inhaler and symptoms improved.  Denies SOB at present.  Pt wears 2 liters O2 via [place].

## 2020-02-21 ENCOUNTER — Inpatient Hospital Stay (HOSPITAL_COMMUNITY): Payer: Medicare Other

## 2020-02-21 DIAGNOSIS — I50811 Acute right heart failure: Secondary | ICD-10-CM

## 2020-02-21 DIAGNOSIS — J4540 Moderate persistent asthma, uncomplicated: Secondary | ICD-10-CM

## 2020-02-21 DIAGNOSIS — I5033 Acute on chronic diastolic (congestive) heart failure: Secondary | ICD-10-CM

## 2020-02-21 LAB — CBC
HCT: 31.9 % — ABNORMAL LOW (ref 36.0–46.0)
Hemoglobin: 10.2 g/dL — ABNORMAL LOW (ref 12.0–15.0)
MCH: 27.8 pg (ref 26.0–34.0)
MCHC: 32 g/dL (ref 30.0–36.0)
MCV: 86.9 fL (ref 80.0–100.0)
Platelets: 315 10*3/uL (ref 150–400)
RBC: 3.67 MIL/uL — ABNORMAL LOW (ref 3.87–5.11)
RDW: 15.5 % (ref 11.5–15.5)
WBC: 11.4 10*3/uL — ABNORMAL HIGH (ref 4.0–10.5)
nRBC: 0.2 % (ref 0.0–0.2)

## 2020-02-21 LAB — BASIC METABOLIC PANEL
Anion gap: 10 (ref 5–15)
BUN: 63 mg/dL — ABNORMAL HIGH (ref 8–23)
CO2: 27 mmol/L (ref 22–32)
Calcium: 8.9 mg/dL (ref 8.9–10.3)
Chloride: 96 mmol/L — ABNORMAL LOW (ref 98–111)
Creatinine, Ser: 2.66 mg/dL — ABNORMAL HIGH (ref 0.44–1.00)
GFR calc Af Amer: 19 mL/min — ABNORMAL LOW (ref >60)
GFR calc non Af Amer: 17 mL/min — ABNORMAL LOW (ref >60)
Glucose, Bld: 220 mg/dL — ABNORMAL HIGH (ref 70–99)
Potassium: 3.9 mmol/L (ref 3.5–5.1)
Sodium: 133 mmol/L — ABNORMAL LOW (ref 135–145)

## 2020-02-21 LAB — GLUCOSE, CAPILLARY
Glucose-Capillary: 211 mg/dL — ABNORMAL HIGH (ref 70–99)
Glucose-Capillary: 303 mg/dL — ABNORMAL HIGH (ref 70–99)
Glucose-Capillary: 351 mg/dL — ABNORMAL HIGH (ref 70–99)
Glucose-Capillary: 216 mg/dL — ABNORMAL HIGH (ref 70–99)

## 2020-02-21 LAB — CBG MONITORING, ED: Glucose-Capillary: 271 mg/dL — ABNORMAL HIGH (ref 70–99)

## 2020-02-21 LAB — HEMOGLOBIN A1C
Hgb A1c MFr Bld: 7.3 % — ABNORMAL HIGH (ref 4.8–5.6)
Mean Plasma Glucose: 162.81 mg/dL

## 2020-02-21 LAB — ECHOCARDIOGRAM COMPLETE
Area-P 1/2: 3.48 cm2
Height: 59 in
S' Lateral: 2.6 cm
Weight: 3594.38 oz

## 2020-02-21 LAB — TROPONIN I (HIGH SENSITIVITY): Troponin I (High Sensitivity): 12 ng/L (ref <18)

## 2020-02-21 MED ORDER — INSULIN ASPART 100 UNIT/ML ~~LOC~~ SOLN
0.0000 [IU] | Freq: Every day | SUBCUTANEOUS | Status: DC
  Administered 2020-02-21: 2 [IU] via SUBCUTANEOUS
  Administered 2020-02-22: 3 [IU] via SUBCUTANEOUS

## 2020-02-21 MED ORDER — VITAMIN D 25 MCG (1000 UNIT) PO TABS
1000.0000 [IU] | ORAL_TABLET | Freq: Every day | ORAL | Status: DC
  Administered 2020-02-22 – 2020-02-26 (×5): 1000 [IU] via ORAL
  Filled 2020-02-21 (×5): qty 1

## 2020-02-21 MED ORDER — PSYLLIUM 95 % PO PACK
1.0000 | PACK | Freq: Every day | ORAL | Status: DC
  Administered 2020-02-22 – 2020-02-26 (×5): 1 via ORAL
  Filled 2020-02-21 (×6): qty 1

## 2020-02-21 MED ORDER — SACCHAROMYCES BOULARDII 250 MG PO CAPS
250.0000 mg | ORAL_CAPSULE | Freq: Two times a day (BID) | ORAL | Status: DC
  Administered 2020-02-21 – 2020-02-26 (×10): 250 mg via ORAL
  Filled 2020-02-21 (×10): qty 1

## 2020-02-21 MED ORDER — MONTELUKAST SODIUM 10 MG PO TABS
10.0000 mg | ORAL_TABLET | Freq: Every day | ORAL | Status: DC
  Administered 2020-02-21 – 2020-02-25 (×5): 10 mg via ORAL
  Filled 2020-02-21 (×5): qty 1

## 2020-02-21 MED ORDER — INSULIN ASPART 100 UNIT/ML ~~LOC~~ SOLN
0.0000 [IU] | Freq: Three times a day (TID) | SUBCUTANEOUS | Status: DC
  Administered 2020-02-22: 8 [IU] via SUBCUTANEOUS
  Administered 2020-02-22: 15 [IU] via SUBCUTANEOUS
  Administered 2020-02-22: 5 [IU] via SUBCUTANEOUS
  Administered 2020-02-23: 8 [IU] via SUBCUTANEOUS
  Administered 2020-02-23: 5 [IU] via SUBCUTANEOUS
  Administered 2020-02-23: 2 [IU] via SUBCUTANEOUS
  Administered 2020-02-24: 5 [IU] via SUBCUTANEOUS
  Administered 2020-02-24: 2 [IU] via SUBCUTANEOUS
  Administered 2020-02-25: 3 [IU] via SUBCUTANEOUS
  Administered 2020-02-26: 2 [IU] via SUBCUTANEOUS
  Administered 2020-02-26: 5 [IU] via SUBCUTANEOUS

## 2020-02-21 MED ORDER — ADULT MULTIVITAMIN W/MINERALS CH
1.0000 | ORAL_TABLET | Freq: Every day | ORAL | Status: DC
  Administered 2020-02-21 – 2020-02-26 (×6): 1 via ORAL
  Filled 2020-02-21 (×5): qty 1

## 2020-02-21 MED ORDER — ALBUTEROL SULFATE (2.5 MG/3ML) 0.083% IN NEBU
2.5000 mg | INHALATION_SOLUTION | Freq: Three times a day (TID) | RESPIRATORY_TRACT | Status: DC
  Administered 2020-02-21 – 2020-02-24 (×10): 2.5 mg via RESPIRATORY_TRACT
  Filled 2020-02-21 (×11): qty 3

## 2020-02-21 MED ORDER — POLYVINYL ALCOHOL 1.4 % OP SOLN
1.0000 [drp] | Freq: Every day | OPHTHALMIC | Status: DC | PRN
  Administered 2020-02-22 – 2020-02-25 (×3): 1 [drp] via OPHTHALMIC
  Filled 2020-02-21 (×2): qty 15

## 2020-02-21 MED ORDER — BENZONATATE 100 MG PO CAPS
100.0000 mg | ORAL_CAPSULE | Freq: Three times a day (TID) | ORAL | Status: DC
  Administered 2020-02-21 – 2020-02-26 (×17): 100 mg via ORAL
  Filled 2020-02-21 (×17): qty 1

## 2020-02-21 MED ORDER — GUAIFENESIN ER 600 MG PO TB12
600.0000 mg | ORAL_TABLET | Freq: Two times a day (BID) | ORAL | Status: DC | PRN
  Administered 2020-02-21: 600 mg via ORAL
  Filled 2020-02-21: qty 1

## 2020-02-21 MED ORDER — INSULIN ASPART 100 UNIT/ML ~~LOC~~ SOLN: 5.0000 [IU] | Freq: Three times a day (TID) | SUBCUTANEOUS | Status: DC

## 2020-02-21 MED ORDER — BIOTIN 1000 MCG PO TABS: 1000.0000 ug | ORAL_TABLET | Freq: Every day | ORAL | Status: DC

## 2020-02-21 NOTE — Evaluation (Signed)
Physical Therapy Evaluation Patient Details Name: Amanda Cain MRN: [id number] DOB: May 08, 1946 Today's Date: 02/21/2020   History of Present Illness  74 yo admitted with SOB, edema, CHF. PMHx:chronic hypoxemic respiratory failure-on 2 L PRN, HTN, HLD, DM, depression/anxiety, CKD stage IV, anemia of chronic disease, GERD, hypothyroidism, obesity  Clinical Impression  Pt in bed on arrival with report of living alone and having aide for 4 hours a day. Pt with progressive decline in mobility and reports having trouble walking from bed to bathroom. Pt with HR 76-87 during session and SpO2 88-94% on RA. Pt with decreased strength, transfers, gait, safety and function who will benefit from acute therapy to maximize mobility, safety and independence to decrease burden of care and fall risk.     Follow Up Recommendations SNF;Supervision for mobility/OOB    Equipment Recommendations  None recommended by PT    Recommendations for Other Services       Precautions / Restrictions Precautions Precautions: Fall      Mobility  Bed Mobility Overal bed mobility: Needs Assistance Bed Mobility: Supine to Sit     Supine to sit: Mod assist;HOB elevated     General bed mobility comments: use of rail with increased time and assist to pivot to right side of bed. assist to scoot to EOB. Pt normally sleeps in recliner at home  Transfers Overall transfer level: Needs assistance   Transfers: Sit to/from Stand Sit to Stand: Min guard         General transfer comment: cues for hand placement to rise from bed and from toilet  Ambulation/Gait Ambulation/Gait assistance: Min assist Gait Distance (Feet): 20 Feet Assistive device: Rolling walker (2 wheeled) Gait Pattern/deviations: Step-through pattern;Decreased stride length;Trunk flexed   Gait velocity interpretation: >2.62 ft/sec, indicative of community ambulatory General Gait Details: cues for sequence, safety and encouragement to maximize  distance  Stairs            Wheelchair Mobility    Modified [place] (Stroke Patients Only)       Balance Overall balance assessment: Needs assistance   Sitting balance-Leahy Scale: Good     Standing balance support: Single extremity supported;Bilateral upper extremity supported Standing balance-Leahy Scale: Poor Standing balance comment: single and bil UE support in standing for pericare and gait                             Pertinent Vitals/Pain Pain Assessment: No/denies pain    Home Living Family/patient expects to be discharged to:: Private residence Living Arrangements: Alone Available Help at Discharge: Friend(s);Personal care attendant Type of Home: Apartment Home Access: Level entry     Home Layout: One level Home Equipment: [place] - 2 wheels;Cane - single point Additional Comments: aide 4hrs/day 6 days/wk    Prior Function Level of Independence: Needs assistance   Gait / Transfers Assistance Needed: walks with cane  ADL's / Homemaking Assistance Needed: aide assists with washing back, housework, cleaning        Hand Dominance        Extremity/Trunk Assessment   Upper Extremity Assessment Upper Extremity Assessment: Generalized weakness    Lower Extremity Assessment Lower Extremity Assessment: Generalized weakness    Cervical / Trunk Assessment Cervical / Trunk Assessment: Kyphotic  Communication   Communication: No difficulties  Cognition Arousal/Alertness: Awake/alert Behavior During Therapy: WFL for tasks assessed/performed Overall Cognitive Status: Impaired/Different from baseline Area of Impairment: Safety/judgement  Safety/Judgement: Decreased awareness of safety;Decreased awareness of deficits            General Comments      Exercises     Assessment/Plan    PT Assessment Patient needs continued PT services  PT Problem List Decreased strength;Decreased mobility;Decreased  safety awareness;Decreased activity tolerance;Decreased balance;Decreased knowledge of use of DME       PT Treatment Interventions Gait training;Functional mobility training;Therapeutic activities;Patient/family education;DME instruction;Therapeutic exercise;Balance training    PT Goals (Current goals can be found in the Care Plan section)  Acute Rehab PT Goals Patient Stated Goal: return home and be able to move more PT Goal Formulation: With patient Time For Goal Achievement: 03/06/20 Potential to Achieve Goals: Good    Frequency Min 3X/week   Barriers to discharge Decreased caregiver support      Co-evaluation               AM-PAC PT "6 Clicks" Mobility  Outcome Measure Help needed turning from your back to your side while in a flat bed without using bedrails?: A Little Help needed moving from lying on your back to sitting on the side of a flat bed without using bedrails?: A Little Help needed moving to and from a bed to a chair (including a wheelchair)?: A Little Help needed standing up from a chair using your arms (e.g., wheelchair or bedside chair)?: A Little Help needed to walk in hospital room?: A Little Help needed climbing 3-5 steps with a railing? : A Lot 6 Click Score: 17    End of Session Equipment Utilized During Treatment: Gait belt Activity Tolerance: Patient tolerated treatment well Patient left: in chair;with call bell/phone within reach;with chair alarm set Nurse Communication: Mobility status PT Visit Diagnosis: Other abnormalities of gait and mobility (R26.89);Difficulty in walking, not elsewhere classified (R26.2);Muscle weakness (generalized) (M62.81)    Time: 4174-0814 PT Time Calculation (min) (ACUTE ONLY): 27 min   Charges:   PT Evaluation $PT Eval Moderate Complexity: 1 Mod PT Treatments $Therapeutic Activity: 8-22 mins        Juneau Doughman P, PT Acute Rehabilitation Services Pager: [phone] Office: [phone]   Sandy Salaam  Lam Bjorklund 02/21/2020, 10:47 AM

## 2020-02-21 NOTE — Progress Notes (Addendum)
Inpatient Diabetes Program Recommendations  AACE/ADA: New Consensus Statement on Inpatient Glycemic Control (2015)  Target Ranges:  Prepandial:   less than 140 mg/dL      Peak postprandial:   less than 180 mg/dL (1-2 hours)      Critically ill patients:  140 - 180 mg/dL   Lab Results  Component Value Date   GLUCAP 303 (H) 02/21/2020   HGBA1C 7.3 (H) 02/21/2020    Review of Glycemic Control Results for BIANA, HAGGAR (MRN [id number]) as of 02/21/2020 12:34  Ref. Range 02/20/2020 16:38 02/20/2020 18:15 02/21/2020 00:36 02/21/2020 06:12 02/21/2020 11:26  Glucose-Capillary Latest Ref Range: 70 - 99 mg/dL 276 (H) 300 (H) 271 (H) 211 (H) 303 (H)   Diabetes history:  DM2 Outpatient Diabetes medications:  Amaryl 1 mg daily Januvis 50 mg daily Current orders for Inpatient glycemic control: Lantus 14 units daily-just started this morning Novolog 0-9 units tid Novolog 0-5 units qhs  Inpatient Diabetes Program Recommendations:    If postprandials remain elevated please consider, 1-Novolog 3 units tid with meals if eats at least 50% of meal 2-Novolog 0-15 units tid  3-Carb modified diet  Will continue to follow while inpatient.  Thank you, Reche Dixon, RN, BSN Diabetes Coordinator Inpatient Diabetes Program [phone] (team pager from 8a-5p)

## 2020-02-21 NOTE — Progress Notes (Signed)
  Echocardiogram 2D Echocardiogram has been performed.  Fidel Levy 02/21/2020, 2:27 PM

## 2020-02-21 NOTE — Plan of Care (Signed)
  Problem: Health Behavior/Discharge Planning: Goal: Ability to manage health-related needs will improve Outcome: Not Progressing   Problem: Clinical Measurements: Goal: Respiratory complications will improve Outcome: Not Progressing   

## 2020-02-21 NOTE — Progress Notes (Signed)
Mobility Specialist - Progress Note   02/21/20 1630  Mobility  Activity Refused mobility   Pt persistently refused mobility, stating that her legs are too swollen and she does not wish to get up.   Pricilla Handler Mobility Specialist Mobility Specialist Phone: [phone]

## 2020-02-21 NOTE — Progress Notes (Signed)
Occupational Therapy Evaluation  Patient with functional deficits listed below impacting safety and independence with self care. Patient reports lives alone, has aide 4hrs/6 days a week to assist with IADLs and washing her back. Currently patient is min A for functional transfers, LB dressing to pull up socks and for perianal care. Patient reports feeling weaker than her baseline "I don't know why I'm so weak." Patient on 2L O2 currently, observe labored breathing with minimal exertion to pull up socks in sitting. Recommend continued acute OT services to maximize patient activity tolerance, balance, safety in order to facilitate D/C to venue listed below.    02/21/20 1300  OT Visit Information  Last OT Received On 02/21/20  Assistance Needed +1  History of Present Illness 74 yo admitted with SOB, edema, CHF. PMHx:chronic hypoxemic respiratory failure-on 2 L PRN, HTN, HLD, DM, depression/anxiety, CKD stage IV, anemia of chronic disease, GERD, hypothyroidism, obesity  Precautions  Precautions Fall  Restrictions  Weight Bearing Restrictions No  Home Living  Family/patient expects to be discharged to: Private residence  Living Arrangements Alone  Available Help at Discharge Friend(s);Personal care attendant  Type of Home Apartment  Home Access Level entry  [place] One level  Bathroom Shower/Tub Tub/shower unit  Tax adviser - 2 wheels;Cane - single point;Shower seat;Grab bars - tub/shower  Additional Comments aide 4hrs/day 6 days/wk  Prior Function  Level of Independence Needs assistance  Gait / Transfers Assistance Needed walks with cane  ADL's / [place] Needed aide assists with washing back, housework, cleaning  Communication  Communication No difficulties  Pain Assessment  Pain Assessment No/denies pain  Cognition  Arousal/Alertness Awake/alert  Behavior During Therapy WFL for tasks assessed/performed  Overall Cognitive Status  Within Functional Limits for tasks assessed  Upper Extremity Assessment  Upper Extremity Assessment Generalized weakness  Lower Extremity Assessment  Lower Extremity Assessment Defer to PT evaluation  Cervical / Trunk Assessment  Cervical / Trunk Assessment Kyphotic  ADL  Overall ADL's  Needs assistance/impaired  Grooming Wash/dry hands;Minimal assistance;Standing  Upper Body Bathing Sitting;Set up  Lower Body Bathing Minimal assistance;Sit to/from stand;Sitting/lateral leans  Upper Body Dressing  Set up;Sitting  Lower Body Dressing Minimal assistance;Sit to/from stand;Sitting/lateral leans  Lower Body Dressing Details (indicate cue type and reason) patient able to pull up socks from sitting, becomes winded with minimal exertion  Toilet Transfer Minimal assistance;Cueing for safety;Ambulation;Regular Toilet;Grab bars;RW  Armed forces technical officer Details (indicate cue type and reason) min A for stability, patient reports feeling weaker and less energy than her baseline  Toileting- Clothing Manipulation and Hygiene Minimal assistance;Sit to/from stand  Toileting - Clothing Manipulation Details (indicate cue type and reason) for thoroughness after bowel movement  Functional mobility during ADLs Minimal assistance;Cueing for safety;Rolling walker  General ADL Comments patient requiring increased assistance for self care due to decreased activity tolerance, balance, safety, overall strengthening  Bed Mobility  General bed mobility comments in recliner  Transfers  Overall transfer level Needs assistance  Equipment used Rolling walker (2 wheeled)  Transfers Sit to/from Stand  Sit to Stand Min assist  General transfer comment verbal cues for hand placement, min A for safety and stability due to fatigue with minimal exertion  Balance  Overall balance assessment Needs assistance  Sitting-balance support Feet supported  Sitting balance-Leahy Scale Good  Standing balance support Bilateral upper extremity  supported;Single extremity supported  Standing balance-Leahy Scale Poor  Standing balance comment held onto grab bar during perianal care  General Comments  General comments (skin integrity, edema, etc.) pt on 2L O2, difficulty maintaining pleth wave to obtain vitals   OT - End of Session  Equipment Utilized During Treatment Rolling walker;Oxygen  Activity Tolerance Patient limited by fatigue  Patient left in chair;with call bell/phone within reach  Nurse Communication Mobility status  OT Assessment  OT Recommendation/Assessment Patient needs continued OT Services  OT Visit Diagnosis Other abnormalities of gait and mobility (R26.89);Muscle weakness (generalized) (M62.81)  OT Problem List Decreased activity tolerance;Decreased strength;Impaired balance (sitting and/or standing);Decreased safety awareness;Obesity  OT Plan  OT Frequency (ACUTE ONLY) Min 2X/week  OT Treatment/Interventions (ACUTE ONLY) Self-care/ADL training;Therapeutic exercise;DME and/or AE instruction;Therapeutic activities;Patient/family education;Balance training  AM-PAC OT "6 Clicks" Daily Activity Outcome Measure (Version 2)  Help from another person eating meals? 4  Help from another person taking care of personal grooming? 3  Help from another person toileting, which includes using toliet, bedpan, or urinal? 3  Help from another person bathing (including washing, rinsing, drying)? 3  Help from another person to put on and taking off regular upper body clothing? 3  Help from another person to put on and taking off regular lower body clothing? 3  6 Click Score 19  OT Recommendation  Follow Up Recommendations SNF  OT Equipment Other (comment) (defer to next venue)  Individuals Consulted  Consulted and Agree with Results and Recommendations Patient  Acute Rehab OT Goals  Patient Stated Goal return home and be able to move more  OT Goal Formulation With patient  Time For Goal Achievement 03/06/20  Potential to  Achieve Goals Good  OT Time Calculation  OT Start Time (ACUTE ONLY) 1046  OT Stop Time (ACUTE ONLY) 1106  OT Time Calculation (min) 20 min  OT General Charges  $OT Visit 1 Visit  OT Evaluation  $OT Eval Low Complexity 1 Low  Written Expression  Dominant Hand Right   Delbert Phenix OT OT pager: [phone]

## 2020-02-21 NOTE — Progress Notes (Signed)
Patient arrived via stretcher from ED. Vital signs, height and weight completed. Placed on telemetry, [place] notified. Patient oriented to room, call bell within reach.

## 2020-02-21 NOTE — Progress Notes (Signed)
PROGRESS NOTE    Amanda Cain  GUY:[id number] DOB: 1946-01-27 DOA: 02/20/2020 PCP: Mayra Neer, MD    Brief Narrative:  74 y.o. female with medical history significant of grade 2 diastolic dysfunction with preserved ejection fraction, chronic hypoxemic respiratory failure-on 2 L of oxygen as needed at home secondary to underlying mild persistent asthma, hypertension, hyperlipidemia, type 2 diabetes mellitus, depression/anxiety, CKD stage IV, anemia of chronic disease, GERD, hypothyroidism, obesity presents to emergency department with worsening shortness of breath and lower extremity edema.  Patient reports worsening shortness of breath and leg edema since 5 days.  Reports weight gain of 4 pounds, orthopnea, unable to walk due to swelling, associated with wheezing.  Has chronic cough with a sputum production of clear mucus, denies fever, chills, chest pain, palpitation, headache, blurry vision, nausea, vomiting, abdominal pain, urinary symptoms.  She uses oxygen only as needed at home.  She lives alone at home.  She is fully vaccinated against COVID-19.  No history of smoking, alcohol, illicit drug use.  She uses cane sometimes for ambulation at home.  She talked to her PCP regarding worsening leg swelling and was advised to increase torsemide dose for 2 days and encourage dietary modification-with little to no help.  She sees Dr. Irish Lack outpatient.  ED Course: Upon arrival to ED: Patient heart rate noted to be 58, blood pressure slightly elevated, on 2 L of oxygen via nasal cannula, afebrile with no leukocytosis, BMP shows sodium of 134, potassium 3.3, chloride 97, CKD stage IV, BNP: 119 (below baseline) initial troponin XVIII, COVID-19 pending.  Chest x-ray shows mild diffuse increased pulmonary interstitium bilaterally.  Patient was given albuterol, Atrovent inhaler, loading dose of Solu-Medrol and Lasix 40 IV once in ED.  [facility] hospitalist consulted for admission for acute asthma  exacerbation and acute on chronic diastolic CHF.  Assessment & Plan:   Principal Problem:   Acute on chronic diastolic heart failure (HCC) Active Problems:   Hyponatremia   Type 2 diabetes mellitus with complication, without long-term current use of insulin (HCC)   Essential hypertension   Hypothyroidism, acquired   Dyslipidemia   GERD (gastroesophageal reflux disease)   CKD (chronic kidney disease), stage III   Morbid obesity (HCC)   Asthma   Hypokalemia   Anxiety   Acute on chronic diastolic (congestive) heart failure (HCC)    Acute on chronic diastolic CHF: -Patient has history of grade 2 diastolic CHF with preserved ejection fraction..  Reviewed echo from 2019.  Reports worsening shortness of breath, leg edema, weight gain.  Reviewed chest x-ray, BNP: 119. -cont on Lasix IV twice daily (60 mg and 40 mg). -Cont with strict INO's and daily weight. -Keep potassium more than 4, magnesium more than 2.  Check electrolytes. -Monitor kidney function and vitals closely. -TSH normal at 0.549 -TTE reviewed,  Pending results  Acute asthma exacerbation:  -Decreased BS on exam  -She is maintaining oxygen saturation on 2 L of oxygen via nasal cannula which is her baseline.   -COVID-19 is neg -Received Solu-Medrol loading dose in ED. -Continued on Solu-Medrol IV twice daily, albuterol every 4 hourly and every 2 hours as needed for shortness of breath and wheezing. -Will decrease solumedrol to 40mg  q12h -Incentive spirometry.  CKD stage IV:  -Mild declined.  Creatinine: 2.62, GFR: 20 (baseline creatinine: 2.35, GFR: 23) -Continue to monitor.  Hold nephrotoxic medication.   -Recheck BMP tomorrow a.m.  Type 2 diabetes mellitus: -Hold glimepiride, Invega, Januvia.   -Continue SSI coverage as needed -  Continue on lantus and aspart -A1c of 7.3  Hyperlipidemia: Continue statin and Zetia  Hypertension: Continue metoprolol, hydralazine, Cardizem  GERD: Continue PPI and  Carafate  Hypothyroidism: TSH within normal limits.  Continue levothyroxine  Hypokalemia: Could be secondary to albuterol breathing treatment and diuretics.  replaced. Repeat bmet in AM  Anemia of chronic disease: -Slightly below baseline.  H&H today is 9.8/34.4, was 10.6/34.41-year ago. -In the setting of CKD.  Stable.  Continue to monitor  DVT prophylaxis: Heparin subq Code Status: Full Family Communication: Pt in room, family not at bedside  Status is: Inpatient  Remains inpatient appropriate because:Ongoing diagnostic testing needed not appropriate for outpatient work up and IV treatments appropriate due to intensity of illness or inability to take PO   Dispo: The patient is from: Home              Anticipated d/c is to: Home              Anticipated d/c date is: 3 days              Patient currently is not medically stable to d/c.       Consultants:     Procedures:     Antimicrobials: Anti-infectives (From admission, onward)   None       Subjective: Feeling better but still sob  Objective: Vitals:   02/21/20 0737 02/21/20 0753 02/21/20 0901 02/21/20 1127  BP:  (!) 171/69  (!) 181/75  Pulse: 72 88  74  Resp:  16  20  Temp:  98.4 F (36.9 C)  98 F (36.7 C)  TempSrc:  Oral  Oral  SpO2: 98% 94% 96% 100%  Weight:      Height:        Intake/Output Summary (Last 24 hours) at 02/21/2020 1453 Last data filed at 02/21/2020 0758 Gross per 24 hour  Intake 720 ml  Output 850 ml  Net -130 ml   Filed Weights   02/20/20 0916 02/21/20 0152  Weight: 101.2 kg 101.9 kg    Examination:  General exam: Appears calm and comfortable  Respiratory system: Clear to auscultation. Respiratory effort normal. Cardiovascular system: S1 & S2 heard, Regular Gastrointestinal system: Abdomen is nondistended, soft and nontender. No organomegaly or masses felt. Normal bowel sounds heard. Central nervous system: Alert and oriented. No focal neurological  deficits. Extremities: Symmetric 5 x 5 power. Skin: No rashes, lesions  Psychiatry: Judgement and insight appear normal. Mood & affect appropriate.   Data Reviewed: I have personally reviewed following labs and imaging studies  CBC: Recent Labs  Lab 02/20/20 0923 02/21/20 0406  WBC 9.3 11.4*  HGB 9.8* 10.2*  HCT 32.4* 31.9*  MCV 89.3 86.9  PLT 292 829   Basic Metabolic Panel: Recent Labs  Lab 02/20/20 0923 02/20/20 1353 02/21/20 0406  NA 134*  --  133*  K 3.3*  --  3.9  CL 97*  --  96*  CO2 28  --  27  GLUCOSE 214*  --  220*  BUN 56*  --  63*  CREATININE 2.62* 2.72* 2.66*  CALCIUM 9.0  --  8.9  MG  --  1.9  --    GFR: Estimated Creatinine Clearance: 18.9 mL/min (A) (by C-G formula based on SCr of 2.66 mg/dL (H)). Liver Function Tests: No results for input(s): AST, ALT, ALKPHOS, BILITOT, PROT, ALBUMIN in the last 168 hours. No results for input(s): LIPASE, AMYLASE in the last 168 hours. No results for input(s): AMMONIA in  the last 168 hours. Coagulation Profile: Recent Labs  Lab 02/20/20 1353  INR 1.2   Cardiac Enzymes: No results for input(s): CKTOTAL, CKMB, CKMBINDEX, TROPONINI in the last 168 hours. BNP (last 3 results) No results for input(s): PROBNP in the last 8760 hours. HbA1C: Recent Labs    02/21/20 0406  HGBA1C 7.3*   CBG: Recent Labs  Lab 02/20/20 1638 02/20/20 1815 02/21/20 0036 02/21/20 0612 02/21/20 1126  GLUCAP 276* 300* 271* 211* 303*   Lipid Profile: No results for input(s): CHOL, HDL, LDLCALC, TRIG, CHOLHDL, LDLDIRECT in the last 72 hours. Thyroid Function Tests: Recent Labs    02/20/20 1354  TSH 0.549   Anemia Panel: No results for input(s): VITAMINB12, FOLATE, FERRITIN, TIBC, IRON, RETICCTPCT in the last 72 hours. Sepsis Labs: No results for input(s): PROCALCITON, LATICACIDVEN in the last 168 hours.  Recent Results (from the past 240 hour(s))  Respiratory Panel by RT PCR (Flu A&B, Covid) - Nasopharyngeal Swab      Status: None   Collection Time: 02/20/20 12:09 PM   Specimen: Nasopharyngeal Swab  Result Value Ref Range Status   SARS Coronavirus 2 by RT PCR NEGATIVE NEGATIVE Final    Comment: (NOTE) SARS-CoV-2 target nucleic acids are NOT DETECTED.  The SARS-CoV-2 RNA is generally detectable in upper respiratoy specimens during the acute phase of infection. The lowest concentration of SARS-CoV-2 viral copies this assay can detect is 131 copies/mL. A negative result does not preclude SARS-Cov-2 infection and should not be used as the sole basis for treatment or other patient management decisions. A negative result may occur with  improper specimen collection/handling, submission of specimen other than nasopharyngeal swab, presence of viral mutation(s) within the areas targeted by this assay, and inadequate number of viral copies (<131 copies/mL). A negative result must be combined with clinical observations, patient history, and epidemiological information. The expected result is Negative.  Fact Sheet for Patients:  PinkCheek.be  Fact Sheet for Healthcare Providers:  GravelBags.it  This test is no t yet approved or cleared by the [place] FDA and  has been authorized for detection and/or diagnosis of SARS-CoV-2 by FDA under an Emergency Use Authorization (EUA). This EUA will remain  in effect (meaning this test can be used) for the duration of the COVID-19 declaration under Section 564(b)(1) of the Act, 21 U.S.C. section 360bbb-3(b)(1), unless the authorization is terminated or revoked sooner.     Influenza A by PCR NEGATIVE NEGATIVE Final   Influenza B by PCR NEGATIVE NEGATIVE Final    Comment: (NOTE) The Xpert Xpress SARS-CoV-2/FLU/RSV assay is intended as an aid in  the diagnosis of influenza from Nasopharyngeal swab specimens and  should not be used as a sole basis for treatment. Nasal washings and  aspirates are  unacceptable for Xpert Xpress SARS-CoV-2/FLU/RSV  testing.  Fact Sheet for Patients: PinkCheek.be  Fact Sheet for Healthcare Providers: GravelBags.it  This test is not yet approved or cleared by the [place] FDA and  has been authorized for detection and/or diagnosis of SARS-CoV-2 by  FDA under an Emergency Use Authorization (EUA). This EUA will remain  in effect (meaning this test can be used) for the duration of the  Covid-19 declaration under Section 564(b)(1) of the Act, 21  U.S.C. section 360bbb-3(b)(1), unless the authorization is  terminated or revoked. Performed at [facility], [place] [address] [place], [place] [address]      Radiology Studies: DG Chest 2 View  Result Date: 02/20/2020 CLINICAL DATA:  Shortness  of breath EXAM: CHEST - 2 VIEW COMPARISON:  February 06, 2019 FINDINGS: The heart size and mediastinal contours are stable. Mild diffuse increased pulmonary interstitium is identified bilaterally. There is no focal pneumonia or pleural effusion. Chronic linear scar is identified in the left mid to lung base. Bony structures are stable. IMPRESSION: Mild diffuse increased pulmonary interstitium bilaterally, mild interstitial edema is not excluded. Electronically Signed   By: Abelardo Diesel M.D.   On: 02/20/2020 09:37    Scheduled Meds: . albuterol  2.5 mg Nebulization TID  . atorvastatin  80 mg Oral Daily  . benzonatate  100 mg Oral TID  . cholecalciferol  1,000 Units Oral Daily  . diltiazem  360 mg Oral Daily  . escitalopram  20 mg Oral QHS  . ezetimibe  10 mg Oral Daily  . fluticasone furoate-vilanterol  1 puff Inhalation Daily  . furosemide  40 mg Intravenous Q24H  . furosemide  60 mg Intravenous Q24H  . heparin  5,000 Units Subcutaneous Q8H  . hydrALAZINE  25 mg Oral Q8H  . insulin aspart  0-15 Units Subcutaneous TID WC  . insulin aspart  0-5 Units Subcutaneous QHS  . insulin aspart  0-5 Units  Subcutaneous QHS  . insulin aspart  5 Units Subcutaneous TID WC  . insulin glargine  14 Units Subcutaneous Daily  . levothyroxine  200 mcg Oral Q24H  . LORazepam  0.5 mg Oral QHS  . methylPREDNISolone (SOLU-MEDROL) injection  80 mg Intravenous Q12H  . metoprolol succinate  100 mg Oral q morning - 10a  . montelukast  10 mg Oral QHS  . multivitamin with minerals  1 tablet Oral Daily  . pantoprazole  40 mg Oral Daily  . psyllium  1 packet Oral Daily  . saccharomyces boulardii  250 mg Oral BID  . sodium chloride flush  3 mL Intravenous Q12H  . sucralfate  1 g Oral BID   Continuous Infusions: . sodium chloride       LOS: 1 day   Marylu Lund, MD [facility] Pager On Amion  If 7PM-7AM, please contact night-coverage 02/21/2020, 2:53 PM

## 2020-02-22 ENCOUNTER — Inpatient Hospital Stay (HOSPITAL_COMMUNITY): Payer: Medicare Other

## 2020-02-22 LAB — BASIC METABOLIC PANEL
Anion gap: 12 (ref 5–15)
BUN: 75 mg/dL — ABNORMAL HIGH (ref 8–23)
CO2: 27 mmol/L (ref 22–32)
Calcium: 8.8 mg/dL — ABNORMAL LOW (ref 8.9–10.3)
Chloride: 95 mmol/L — ABNORMAL LOW (ref 98–111)
Creatinine, Ser: 3.04 mg/dL — ABNORMAL HIGH (ref 0.44–1.00)
GFR calc Af Amer: 17 mL/min — ABNORMAL LOW (ref >60)
GFR calc non Af Amer: 14 mL/min — ABNORMAL LOW (ref >60)
Glucose, Bld: 358 mg/dL — ABNORMAL HIGH (ref 70–99)
Potassium: 3.9 mmol/L (ref 3.5–5.1)
Sodium: 134 mmol/L — ABNORMAL LOW (ref 135–145)

## 2020-02-22 LAB — GLUCOSE, CAPILLARY
Glucose-Capillary: 249 mg/dL — ABNORMAL HIGH (ref 70–99)
Glucose-Capillary: 354 mg/dL — ABNORMAL HIGH (ref 70–99)
Glucose-Capillary: 262 mg/dL — ABNORMAL HIGH (ref 70–99)
Glucose-Capillary: 289 mg/dL — ABNORMAL HIGH (ref 70–99)

## 2020-02-22 MED ORDER — PALIPERIDONE ER 3 MG PO TB24
3.0000 mg | ORAL_TABLET | Freq: Every day | ORAL | Status: DC
  Administered 2020-02-22 – 2020-02-25 (×4): 3 mg via ORAL
  Filled 2020-02-22 (×5): qty 1

## 2020-02-22 MED ORDER — LORAZEPAM 0.5 MG PO TABS
0.5000 mg | ORAL_TABLET | Freq: Three times a day (TID) | ORAL | Status: DC | PRN
  Administered 2020-02-22 – 2020-02-24 (×4): 0.5 mg via ORAL
  Filled 2020-02-22 (×4): qty 1

## 2020-02-22 MED ORDER — METHYLPREDNISOLONE SODIUM SUCC 40 MG IJ SOLR
40.0000 mg | Freq: Two times a day (BID) | INTRAMUSCULAR | Status: DC
  Administered 2020-02-22: 40 mg via INTRAVENOUS
  Filled 2020-02-22: qty 1

## 2020-02-22 MED ORDER — INSULIN ASPART 100 UNIT/ML ~~LOC~~ SOLN
8.0000 [IU] | Freq: Three times a day (TID) | SUBCUTANEOUS | Status: DC
  Administered 2020-02-22 – 2020-02-26 (×13): 8 [IU] via SUBCUTANEOUS

## 2020-02-22 MED ORDER — BLISTEX MEDICATED EX OINT
1.0000 "application " | TOPICAL_OINTMENT | CUTANEOUS | Status: DC | PRN
  Filled 2020-02-22: qty 6.3

## 2020-02-22 MED ORDER — INSULIN GLARGINE 100 UNIT/ML ~~LOC~~ SOLN
25.0000 [IU] | Freq: Every day | SUBCUTANEOUS | Status: DC
  Administered 2020-02-23: 25 [IU] via SUBCUTANEOUS
  Filled 2020-02-22 (×2): qty 0.25

## 2020-02-22 MED ORDER — INSULIN GLARGINE 100 UNIT/ML ~~LOC~~ SOLN
20.0000 [IU] | Freq: Every day | SUBCUTANEOUS | Status: DC
  Administered 2020-02-22: 20 [IU] via SUBCUTANEOUS
  Filled 2020-02-22: qty 0.2

## 2020-02-22 MED ORDER — HYDRALAZINE HCL 20 MG/ML IJ SOLN
5.0000 mg | INTRAMUSCULAR | Status: DC | PRN
  Administered 2020-02-24 – 2020-02-25 (×3): 5 mg via INTRAVENOUS
  Filled 2020-02-22 (×3): qty 1

## 2020-02-22 MED ORDER — METHYLPREDNISOLONE SODIUM SUCC 40 MG IJ SOLR
40.0000 mg | INTRAMUSCULAR | Status: DC
  Administered 2020-02-23: 40 mg via INTRAVENOUS
  Filled 2020-02-22: qty 1

## 2020-02-22 MED ORDER — FUROSEMIDE 10 MG/ML IJ SOLN
60.0000 mg | INTRAMUSCULAR | Status: DC
  Administered 2020-02-23 – 2020-02-26 (×4): 60 mg via INTRAVENOUS
  Filled 2020-02-22 (×4): qty 6

## 2020-02-22 MED ORDER — INSULIN GLARGINE 100 UNIT/ML ~~LOC~~ SOLN
5.0000 [IU] | SUBCUTANEOUS | Status: AC
  Administered 2020-02-22: 5 [IU] via SUBCUTANEOUS
  Filled 2020-02-22: qty 0.05

## 2020-02-22 NOTE — TOC Initial Note (Signed)
Transition of Care [facility]) - Initial/Assessment Note    Patient Details  Name: Amanda Cain MRN: [id number] Date of Birth: 18-Apr-1946  Transition of Care [facility]) CM/SW Contact:    [address], [place] Phone Number: 02/22/2020, 3:14 PM  Clinical Narrative:                 CSW received consult for possible SNF placement at time of discharge. CSW spoke with patient regarding PT recommendation of SNF placement at time of discharge. Patient reported that she is currently unable to care for herself at her home given her current physical needs and fall risk. Patient expressed understanding of PT recommendation and is agreeable to SNF placement at time of discharge. Patient reports preference for [facility] as she was there when she had a knee replacement. CSW discussed insurance authorization process and provided Medicare SNF ratings list. Patient has received the COVID vaccines. Patient expressed being hopeful for rehab and to feel better soon. No further questions reported at this time. CSW to continue to follow and assist with discharge planning needs.    Barriers to Discharge: Continued Medical Work up, Ship broker   Patient Goals and CMS Choice Patient states their goals for this hospitalization and ongoing recovery are:: Get better soon CMS Medicare.gov Compare Post Acute Care list provided to:: Patient Choice offered to / list presented to : Patient  Expected Discharge Plan and Services   In-house Referral: Clinical Social Work     Living arrangements for the past 2 months: Single Family Home                                      Prior Living Arrangements/Services Living arrangements for the past 2 months: Single Family Home Lives with:: Self Patient language and need for interpreter reviewed:: Yes Do you feel safe going back to the place where you live?: No   Lives alone  Need for Family Participation in Patient Care: Yes (Comment) Care giver support  system in place?: No (comment)   Criminal Activity/Legal Involvement Pertinent to Current Situation/Hospitalization: No - Comment as needed  Activities of Daily Living      Permission Sought/Granted Permission sought to share information with : Facility Sport and exercise psychologist, Case Manager, Family Supports Permission granted to share information with : Yes, Release of Information Signed  Share Information with NAME: [name] Stevens  Permission granted to share info w AGENCY: SNFs  Permission granted to share info w Relationship: Sister  Permission granted to share info w Contact Information: [phone]  Emotional Assessment Appearance:: Appears stated age Attitude/Demeanor/Rapport: Gracious Affect (typically observed): Calm        Admission diagnosis:  Acute right-sided congestive heart failure (HCC) [I50.811] SOB (shortness of breath) [R06.02] Acute on chronic diastolic (congestive) heart failure ([place]) [I50.33] Asthma with acute exacerbation, unspecified asthma severity, unspecified whether persistent [J45.901] Patient Active Problem List   Diagnosis Date Noted   Acute on chronic diastolic (congestive) heart failure ([place]) 02/20/2020   Hypokalemia    Anxiety    Physical deconditioning 03/13/2019   Acute recurrent maxillary sinusitis 03/13/2019   Pain due to onychomycosis of toenails of both feet 11/21/2018   Shortness of breath 08/09/2018   Asthma 06/19/2018   Chronic respiratory failure with hypoxia ([place]) 05/15/2018   Pulmonary hypertension ([place]) 05/14/2018   Atypical chest pain 05/12/2018   Community acquired pneumonia 12/13/2017   CAP (community acquired pneumonia) 12/13/2017  CHF (congestive heart failure) ([place]) 12/12/2017   Morbid obesity ([place]) 11/21/2017   Flu 07/07/2017   Influenza A 07/05/2017   Acute respiratory failure with hypoxia ([place]) 07/05/2017   Ascites    Acute on chronic diastolic heart failure ([place]) 11/17/2016   CKD (chronic  kidney disease), stage III 11/17/2016   S/P laparoscopic cholecystectomy 03/02/2015   Hyperlipidemia 11/25/2012   Depression 11/25/2012   GERD (gastroesophageal reflux disease) 11/25/2012   Postoperative anemia due to acute blood loss 11/01/2012   Osteoarthritis of right knee 10/31/2012   Hyperglycemia 11/30/2011   SBP (spontaneous bacterial peritonitis) ([place]) 11/25/2011    Class: Acute   Syncope and collapse 11/20/2011   Abdominal pain 11/19/2011   Hyponatremia 11/19/2011   Anemia 11/19/2011   Acute kidney injury superimposed on CKD ([place]) 11/19/2011   Moderate protein-calorie malnutrition ([place]) 11/19/2011   Type 2 diabetes mellitus with complication, without long-term current use of insulin ([place]) 11/19/2011   Essential hypertension 11/19/2011   Hypothyroidism, acquired 11/19/2011   Dyslipidemia 11/19/2011   PCP:  Mayra Neer, MD Pharmacy:   [facility] [place], [place] - [place] [address] Phone: [phone] Fax: [phone]     Social Determinants of Health (SDOH) Interventions    Readmission Risk Interventions No flowsheet data found.

## 2020-02-22 NOTE — Plan of Care (Signed)
  Problem: Health Behavior/Discharge Planning: Goal: Ability to manage health-related needs will improve Outcome: Progressing   

## 2020-02-22 NOTE — Progress Notes (Signed)
Mobility Specialist - Progress Note   02/22/20 1459  Mobility  Activity Ambulated to bathroom  Level of Assistance Modified independent, requires aide device or extra time  Assistive Device Front wheel walker  Distance Ambulated (ft) 20 ft  Mobility Response Tolerated well  Mobility performed by Mobility specialist  $Mobility charge 1 Mobility    Pre-mobility: 65 HR, 95% SpO2 During mobility: 73 HR, 88% SpO2 Post-mobility: 71 HR, 95% SpO2  Pt ambulated on RA. She desaturated to 88% SpO2 at the end of her walk, she was able to quickly get to 90% with pursed lip breathing.   Pricilla Handler Mobility Specialist Mobility Specialist Phone: [phone]

## 2020-02-22 NOTE — Progress Notes (Signed)
PROGRESS NOTE    Amanda Cain  DJT:[id number] DOB: [birth year] DOA: 02/20/2020 PCP: Mayra Neer, MD    Brief Narrative:  74 y.o. female with medical history significant of grade 2 diastolic dysfunction with preserved ejection fraction, chronic hypoxemic respiratory failure-on 2 L of oxygen as needed at home secondary to underlying mild persistent asthma, hypertension, hyperlipidemia, type 2 diabetes mellitus, depression/anxiety, CKD stage IV, anemia of chronic disease, GERD, hypothyroidism, obesity presents to emergency department with worsening shortness of breath and lower extremity edema.  Patient reports worsening shortness of breath and leg edema since 5 days.  Reports weight gain of 4 pounds, orthopnea, unable to walk due to swelling, associated with wheezing.  Has chronic cough with a sputum production of clear mucus, denies fever, chills, chest pain, palpitation, headache, blurry vision, nausea, vomiting, abdominal pain, urinary symptoms.  She uses oxygen only as needed at home.  She lives alone at home.  She is fully vaccinated against COVID-19.  No history of smoking, alcohol, illicit drug use.  She uses cane sometimes for ambulation at home.  She talked to her PCP regarding worsening leg swelling and was advised to increase torsemide dose for 2 days and encourage dietary modification-with little to no help.  She sees Dr. Irish Lack outpatient.  ED Course: Upon arrival to ED: Patient heart rate noted to be 58, blood pressure slightly elevated, on 2 L of oxygen via nasal cannula, afebrile with no leukocytosis, BMP shows sodium of 134, potassium 3.3, chloride 97, CKD stage IV, BNP: 119 (below baseline) initial troponin XVIII, COVID-19 pending.  Chest x-ray shows mild diffuse increased pulmonary interstitium bilaterally.  Patient was given albuterol, Atrovent inhaler, loading dose of Solu-Medrol and Lasix 40 IV once in ED.  [facility] hospital[facility] consulted for admission for acute asthma  exacerbation and acute on chronic diastolic CHF.  Assessment & Plan:   Principal Problem:   Acute on chronic diastolic heart failure (HCC) Active Problems:   Hyponatremia   Type 2 diabetes mellitus with complication, without long-term current use of insulin (HCC)   Essential hypertension   Hypothyroidism, acquired   Dyslipidemia   GERD (gastroesophageal reflux disease)   CKD (chronic kidney disease), stage III   Morbid obesity (HCC)   Asthma   Hypokalemia   Anxiety   Acute on chronic diastolic (congestive) heart failure (HCC)    Acute on chronic diastolic CHF: -Patient has history of grade 2 diastolic CHF with preserved ejection fraction..  Reviewed echo from 2019.  Reports worsening shortness of breath, leg edema, weight gain.  Reviewed chest x-ray, BNP: 119. -cont on Lasix IV twice daily (60 mg and 40 mg). -Cont with strict INO's and daily weight. -Keep potassium more than 4, magnesium more than 2.  Recheck electrolytes. -Monitor kidney function and vitals closely. -TSH normal at 0.549 -TTE reviewed, findings of EF 60-65%  Acute asthma exacerbation:  -Decreased BS on exam  -She is maintaining oxygen saturation on 2 L of oxygen via nasal cannula which is her baseline.   -COVID-19 is neg -Received Solu-Medrol loading dose in ED. -Continued on Solu-Medrol IV twice daily, albuterol every 4 hourly and every 2 hours as needed for shortness of breath and wheezing. -No audible wheezing on exam this AM -Will further decrease solumedrol to 40mg  IV daily -Incentive spirometry.  CKD stage IV:  -Mild declined.  Creatinine: 2.62, GFR: 20 (baseline creatinine: 2.35, GFR: 23) -Continue to monitor.  Hold nephrotoxic medication.   -Repeat BMP tomorrow a.m.  Type 2 diabetes mellitus: -Hold  glimepiride, Invega, Januvia.   -Continue SSI coverage as needed -Continue on lantus and aspart and titrate as needed -A1c of 7.3  Hyperlipidemia: Continue statin and Zetia  Hypertension:  Continue metoprolol, hydralazine, Cardizem  GERD: Continue PPI and Carafate  Hypothyroidism: TSH within normal limits.  Continue levothyroxine as tolerated  Hypokalemia: Could be secondary to albuterol breathing treatment and diuretics.  replaced. Repeat bmet in AM  Anemia of chronic disease: -Slightly below baseline.  H&H today is 9.8/34.4, was 10.6/34.41-year ago. -In the setting of CKD.  Stable.  Continue to monitor  DVT prophylaxis: Heparin subq Code Status: Full Family Communication: Pt in room, family not at bedside  Status is: Inpatient  Remains inpatient appropriate because:Ongoing diagnostic testing needed not appropriate for outpatient work up and IV treatments appropriate due to intensity of illness or inability to take PO   Dispo: The patient is from: Home              Anticipated d/c is to: Home              Anticipated d/c date is: 3 days              Patient currently is not medically stable to d/c.  Consultants:     Procedures:     Antimicrobials: Anti-infectives (From admission, onward)   None      Subjective: Reports breathing better today  Objective: Vitals:   02/22/20 0803 02/22/20 0832 02/22/20 0909 02/22/20 1120  BP:  (!) 178/66  (!) 158/72  Pulse: 74 68 68 65  Resp: (!) 23 20 (!) 21 20  Temp:  97.7 F (36.5 C)  97.9 F (36.6 C)  TempSrc:  Oral  Oral  SpO2: 100% 97% 96% 99%  Weight:      Height:        Intake/Output Summary (Last 24 hours) at 02/22/2020 1346 Last data filed at 02/22/2020 0840 Gross per 24 hour  Intake 240 ml  Output 1200 ml  Net -960 ml   Filed Weights   02/20/20 0916 02/21/20 0152 02/22/20 0407  Weight: 101.2 kg 101.9 kg 103 kg    Examination: General exam: Awake, laying in bed, in nad Respiratory system: Normal respiratory effort, no wheezing Cardiovascular system: regular rate, s1, s2 Gastrointestinal system: Soft, nondistended, positive BS Central nervous system: CN2-12 grossly intact, strength  intact Extremities: Perfused, no clubbing Skin: Normal skin turgor, no notable skin lesions seen Psychiatry: Mood normal // no visual hallucinations   Data Reviewed: I have personally reviewed following labs and imaging studies  CBC: Recent Labs  Lab 02/20/20 0923 02/21/20 0406  WBC 9.3 11.4*  HGB 9.8* 10.2*  HCT 32.4* 31.9*  MCV 89.3 86.9  PLT 292 720   Basic Metabolic Panel: Recent Labs  Lab 02/20/20 0923 02/20/20 1353 02/21/20 0406  NA 134*  --  133*  K 3.3*  --  3.9  CL 97*  --  96*  CO2 28  --  27  GLUCOSE 214*  --  220*  BUN 56*  --  63*  CREATININE 2.62* 2.72* 2.66*  CALCIUM 9.0  --  8.9  MG  --  1.9  --    GFR: Estimated Creatinine Clearance: 19.1 mL/min (A) (by C-G formula based on SCr of 2.66 mg/dL (H)). Liver Function Tests: No results for input(s): AST, ALT, ALKPHOS, BILITOT, PROT, ALBUMIN in the last 168 hours. No results for input(s): LIPASE, AMYLASE in the last 168 hours. No results for input(s): AMMONIA in the  last 168 hours. Coagulation Profile: Recent Labs  Lab 02/20/20 1353  INR 1.2   Cardiac Enzymes: No results for input(s): CKTOTAL, CKMB, CKMBINDEX, TROPONINI in the last 168 hours. BNP (last 3 results) No results for input(s): PROBNP in the last 8760 hours. HbA1C: Recent Labs    02/21/20 0406  HGBA1C 7.3*   CBG: Recent Labs  Lab 02/21/20 1126 02/21/20 1711 02/21/20 2125 02/22/20 0604 02/22/20 1118  GLUCAP 303* 351* 216* 249* 354*   Lipid Profile: No results for input(s): CHOL, HDL, LDLCALC, TRIG, CHOLHDL, LDLDIRECT in the last 72 hours. Thyroid Function Tests: Recent Labs    02/20/20 1354  TSH 0.549   Anemia Panel: No results for input(s): VITAMINB12, FOLATE, FERRITIN, TIBC, IRON, RETICCTPCT in the last 72 hours. Sepsis Labs: No results for input(s): PROCALCITON, LATICACIDVEN in the last 168 hours.  Recent Results (from the past 240 hour(s))  Respiratory Panel by RT PCR (Flu A&B, Covid) - Nasopharyngeal Swab      Status: None   Collection Time: 02/20/20 12:09 PM   Specimen: Nasopharyngeal Swab  Result Value Ref Range Status   SARS Coronavirus 2 by RT PCR NEGATIVE NEGATIVE Final    Comment: (NOTE) SARS-CoV-2 target nucleic acids are NOT DETECTED.  The SARS-CoV-2 RNA is generally detectable in upper respiratoy specimens during the acute phase of infection. The lowest concentration of SARS-CoV-2 viral copies this assay can detect is 131 copies/mL. A negative result does not preclude SARS-Cov-2 infection and should not be used as the sole basis for treatment or other patient management decisions. A negative result may occur with  improper specimen collection/handling, submission of specimen other than nasopharyngeal swab, presence of viral mutation(s) within the areas targeted by this assay, and inadequate number of viral copies (<131 copies/mL). A negative result must be combined with clinical observations, patient history, and epidemiological information. The expected result is Negative.  Fact Sheet for Patients:  PinkCheek.be  Fact Sheet for Healthcare Providers:  GravelBags.it  This test is no t yet approved or cleared by the [place] FDA and  has been authorized for detection and/or diagnosis of SARS-CoV-2 by FDA under an Emergency Use Authorization (EUA). This EUA will remain  in effect (meaning this test can be used) for the duration of the COVID-19 declaration under Section 564(b)(1) of the Act, 21 U.S.C. section 360bbb-3(b)(1), unless the authorization is terminated or revoked sooner.     Influenza A by PCR NEGATIVE NEGATIVE Final   Influenza B by PCR NEGATIVE NEGATIVE Final    Comment: (NOTE) The Xpert Xpress SARS-CoV-2/FLU/RSV assay is intended as an aid in  the diagnosis of influenza from Nasopharyngeal swab specimens and  should not be used as a sole basis for treatment. Nasal washings and  aspirates are  unacceptable for Xpert Xpress SARS-CoV-2/FLU/RSV  testing.  Fact Sheet for Patients: PinkCheek.be  Fact Sheet for Healthcare Providers: GravelBags.it  This test is not yet approved or cleared by the [place] FDA and  has been authorized for detection and/or diagnosis of SARS-CoV-2 by  FDA under an Emergency Use Authorization (EUA). This EUA will remain  in effect (meaning this test can be used) for the duration of the  Covid-19 declaration under Section 564(b)(1) of the Act, 21  U.S.C. section 360bbb-3(b)(1), unless the authorization is  terminated or revoked. Performed at [facility], [place] [address] [place], [place] [address]      Radiology Studies: ECHOCARDIOGRAM COMPLETE  Result Date: 02/21/2020    ECHOCARDIOGRAM REPORT  Patient Name:   ALIYYAH RIESE Date of Exam: 02/21/2020 Medical Rec #:  [id number]        Height:       59.0 in Accession #:    [id number]       Weight:       224.6 lb Date of Birth:  July 14, 1945         BSA:          1.938 m Patient Age:    48 years         BP:           185/84 mmHg Patient Gender: F                HR:           72 bpm. Exam Location:  Inpatient Procedure: 2D Echo, Cardiac Doppler and Color Doppler Indications:    Congestive Heart Failure 428.0 / I50.9  History:        Patient has prior history of Echocardiogram examinations, most                 recent 11/14/2017. CHF; Risk Factors:Diabetes and Hypertension.  Sonographer:    Bernadene Person RDCS Referring Phys: [id number] Mckinley Jewel  Sonographer Comments: patient refused definity. IMPRESSIONS  1. Left ventricular ejection fraction, by estimation, is 60 to 65%. The left ventricle has normal function. The left ventricle has no regional wall motion abnormalities. There is mild concentric left ventricular hypertrophy. Left ventricular diastolic parameters are consistent with Grade II diastolic dysfunction (pseudonormalization). Elevated  left atrial pressure.  2. Right ventricular systolic function is normal. The right ventricular size is normal.  3. The mitral valve is degenerative. No evidence of mitral valve regurgitation. No evidence of mitral stenosis.  4. The aortic valve is tricuspid. There is mild calcification of the aortic valve. Aortic valve regurgitation is not visualized. Mild aortic valve sclerosis is present, with no evidence of aortic valve stenosis. Comparison(s): No significant change from prior study. FINDINGS  Left Ventricle: Left ventricular ejection fraction, by estimation, is 60 to 65%. The left ventricle has normal function. The left ventricle has no regional wall motion abnormalities. The left ventricular internal cavity size was normal in size. There is  mild concentric left ventricular hypertrophy. Left ventricular diastolic parameters are consistent with Grade II diastolic dysfunction (pseudonormalization). Elevated left atrial pressure. Right Ventricle: The right ventricular size is normal. No increase in right ventricular wall thickness. Right ventricular systolic function is normal. Left Atrium: Left atrial size was normal in size. Right Atrium: Right atrial size was normal in size. Pericardium: Trivial pericardial effusion is present. Mitral Valve: The mitral valve is degenerative in appearance. Mild mitral annular calcification. No evidence of mitral valve regurgitation. No evidence of mitral valve stenosis. Tricuspid Valve: The tricuspid valve is grossly normal. Tricuspid valve regurgitation is trivial. No evidence of tricuspid stenosis. Aortic Valve: The aortic valve is tricuspid. There is mild calcification of the aortic valve. Aortic valve regurgitation is not visualized. Mild aortic valve sclerosis is present, with no evidence of aortic valve stenosis. Pulmonic Valve: The pulmonic valve was grossly normal. Pulmonic valve regurgitation is not visualized. No evidence of pulmonic stenosis. Aorta: The aortic root  and ascending aorta are structurally normal, with no evidence of dilitation. Venous: The inferior vena cava was not well visualized. IAS/Shunts: The atrial septum is grossly normal.  LEFT VENTRICLE PLAX 2D LVIDd:         4.60 cm  Diastology LVIDs:  2.60 cm  LV e' medial:    4.80 cm/s LV PW:         1.20 cm  LV E/e' medial:  32.5 LV IVS:        1.00 cm  LV e' lateral:   5.33 cm/s LVOT diam:     2.10 cm  LV E/e' lateral: 29.3 LV SV:         102 LV SV Index:   53 LVOT Area:     3.46 cm  RIGHT VENTRICLE RV S prime:     14.60 cm/s TAPSE (M-mode): 2.3 cm LEFT ATRIUM             Index       RIGHT ATRIUM           Index LA diam:        3.80 cm 1.96 cm/m  RA Area:     13.20 cm LA Vol (A2C):   92.0 ml 47.48 ml/m RA Volume:   26.90 ml  13.88 ml/m LA Vol (A4C):   75.5 ml 38.96 ml/m LA Biplane Vol: 85.2 ml 43.97 ml/m  AORTIC VALVE LVOT Vmax:   119.00 cm/s LVOT Vmean:  83.200 cm/s LVOT VTI:    0.295 m  AORTA Ao Root diam: 3.30 cm Ao Asc diam:  3.10 cm MITRAL VALVE                TRICUSPID VALVE MV Area (PHT): 3.48 cm     TR Peak grad:   14.0 mmHg MV Decel Time: 218 msec     TR Vmax:        187.00 cm/s MV E velocity: 156.00 cm/s MV A velocity: 147.00 cm/s  SHUNTS MV E/A ratio:  1.06         Systemic VTI:  0.30 m                             Systemic Diam: 2.10 cm Eleonore Chiquito MD Electronically signed by Eleonore Chiquito MD Signature Date/Time: 02/21/2020/3:57:54 PM    Final     Scheduled Meds: . albuterol  2.5 mg Nebulization TID  . atorvastatin  80 mg Oral Daily  . benzonatate  100 mg Oral TID  . cholecalciferol  1,000 Units Oral Daily  . diltiazem  360 mg Oral Daily  . escitalopram  20 mg Oral QHS  . ezetimibe  10 mg Oral Daily  . fluticasone furoate-vilanterol  1 puff Inhalation Daily  . furosemide  40 mg Intravenous Q24H  . furosemide  60 mg Intravenous Q24H  . heparin  5,000 Units Subcutaneous Q8H  . hydrALAZINE  25 mg Oral Q8H  . insulin aspart  0-15 Units Subcutaneous TID WC  . insulin aspart   0-5 Units Subcutaneous QHS  . insulin aspart  0-5 Units Subcutaneous QHS  . insulin aspart  8 Units Subcutaneous TID WC  . [START ON 02/23/2020] insulin glargine  25 Units Subcutaneous Daily  . insulin glargine  5 Units Subcutaneous NOW  . levothyroxine  200 mcg Oral Q24H  . methylPREDNISolone (SOLU-MEDROL) injection  40 mg Intravenous Q12H  . metoprolol succinate  100 mg Oral q morning - 10a  . montelukast  10 mg Oral QHS  . multivitamin with minerals  1 tablet Oral Daily  . paliperidone  3 mg Oral QHS  . pantoprazole  40 mg Oral Daily  . psyllium  1 packet Oral Daily  . saccharomyces boulardii  250  mg Oral BID  . sodium chloride flush  3 mL Intravenous Q12H  . sucralfate  1 g Oral BID   Continuous Infusions: . sodium chloride       LOS: 2 days   Marylu Lund, MD [facility] Pager On Amion  If 7PM-7AM, please contact night-coverage 02/22/2020, 1:46 PM

## 2020-02-23 LAB — BASIC METABOLIC PANEL
Anion gap: 14 (ref 5–15)
BUN: 85 mg/dL — ABNORMAL HIGH (ref 8–23)
CO2: 24 mmol/L (ref 22–32)
Calcium: 9 mg/dL (ref 8.9–10.3)
Chloride: 96 mmol/L — ABNORMAL LOW (ref 98–111)
Creatinine, Ser: 2.92 mg/dL — ABNORMAL HIGH (ref 0.44–1.00)
GFR calc Af Amer: 17 mL/min — ABNORMAL LOW (ref >60)
GFR calc non Af Amer: 15 mL/min — ABNORMAL LOW (ref >60)
Glucose, Bld: 197 mg/dL — ABNORMAL HIGH (ref 70–99)
Potassium: 3.8 mmol/L (ref 3.5–5.1)
Sodium: 134 mmol/L — ABNORMAL LOW (ref 135–145)

## 2020-02-23 LAB — GLUCOSE, CAPILLARY
Glucose-Capillary: 209 mg/dL — ABNORMAL HIGH (ref 70–99)
Glucose-Capillary: 217 mg/dL — ABNORMAL HIGH (ref 70–99)
Glucose-Capillary: 286 mg/dL — ABNORMAL HIGH (ref 70–99)
Glucose-Capillary: 248 mg/dL — ABNORMAL HIGH (ref 70–99)
Glucose-Capillary: 185 mg/dL — ABNORMAL HIGH (ref 70–99)

## 2020-02-23 NOTE — Progress Notes (Signed)
PROGRESS NOTE    Amanda Cain  DPO:[id number] DOB: [birth year] DOA: 02/20/2020 PCP: Mayra Neer, MD    Brief Narrative:  74 y.o. female with medical history significant of grade 2 diastolic dysfunction with preserved ejection fraction, chronic hypoxemic respiratory failure-on 2 L of oxygen as needed at home secondary to underlying mild persistent asthma, hypertension, hyperlipidemia, type 2 diabetes mellitus, depression/anxiety, CKD stage IV, anemia of chronic disease, GERD, hypothyroidism, obesity presents to emergency department with worsening shortness of breath and lower extremity edema.  Patient reports worsening shortness of breath and leg edema since 5 days.  Reports weight gain of 4 pounds, orthopnea, unable to walk due to swelling, associated with wheezing.  Has chronic cough with a sputum production of clear mucus, denies fever, chills, chest pain, palpitation, headache, blurry vision, nausea, vomiting, abdominal pain, urinary symptoms.  She uses oxygen only as needed at home.  She lives alone at home.  She is fully vaccinated against COVID-19.  No history of smoking, alcohol, illicit drug use.  She uses cane sometimes for ambulation at home.  She talked to her PCP regarding worsening leg swelling and was advised to increase torsemide dose for 2 days and encourage dietary modification-with little to no help.  She sees Dr. Irish Lack outpatient.  ED Course: Upon arrival to ED: Patient heart rate noted to be 58, blood pressure slightly elevated, on 2 L of oxygen via nasal cannula, afebrile with no leukocytosis, BMP shows sodium of 134, potassium 3.3, chloride 97, CKD stage IV, BNP: 119 (below baseline) initial troponin XVIII, COVID-19 pending.  Chest x-ray shows mild diffuse increased pulmonary interstitium bilaterally.  Patient was given albuterol, Atrovent inhaler, loading dose of Solu-Medrol and Lasix 40 IV once in ED.  [facility] consulted for admission for acute asthma  exacerbation and acute on chronic diastolic CHF.  Assessment & Plan:   Principal Problem:   Acute on chronic diastolic heart failure (HCC) Active Problems:   Hyponatremia   Type 2 diabetes mellitus with complication, without long-term current use of insulin (HCC)   Essential hypertension   Hypothyroidism, acquired   Dyslipidemia   GERD (gastroesophageal reflux disease)   CKD (chronic kidney disease), stage III   Morbid obesity (HCC)   Asthma   Hypokalemia   Anxiety   Acute on chronic diastolic (congestive) heart failure (HCC)    Acute on chronic diastolic CHF: -Patient has history of grade 2 diastolic CHF with preserved ejection fraction..  Reviewed echo from 2019.  Reports worsening shortness of breath, leg edema, weight gain.  Reviewed chest x-ray, BNP: 119. -Cont with strict INO's and daily weight. -Keep potassium more than 4, magnesium more than 2.  Recheck electrolytes. -Monitor kidney function and vitals closely. -TSH normal at 0.549 -TTE reviewed, findings of EF 60-65% -Pt reports dry weight of 223lbs (101kg). Wt today 102.5kg  Acute asthma exacerbation:  -Decreased BS on exam  -She is maintaining oxygen saturation on 2 L of oxygen via nasal cannula which is her baseline.   -COVID-19 is neg -No audible wheezing on exam this AM -Cont to wean steroids to off -Incentive spirometry.  CKD stage IV:  -Mild declined.  Creatinine: 2.62, GFR: 20 (baseline creatinine: 2.35, GFR: 23) -Continue to monitor.  Hold nephrotoxic medication.   -Recheck BMP tomorrow a.m.  Type 2 diabetes mellitus: -Hold glimepiride, Invega, Januvia.   -Continue SSI coverage as needed -Continue on lantus and aspart and titrate as needed -A1c noted to be 7.3  Hyperlipidemia: Continue statin and Zetia  Hypertension: Continue metoprolol, hydralazine, Cardizem  GERD: Continue PPI and Carafate  Hypothyroidism: TSH within normal limits.  Continue with levothyroxine as  tolerated  Hypokalemia: Could be secondary to albuterol breathing treatment and diuretics.  replaced. Recheck bmet in AM  Anemia of chronic disease: -Slightly below baseline.  H&H today is 9.8/34.4, was 10.6/34.41-year ago. -In the setting of CKD.  Stable.  Continue to monitor for now  DVT prophylaxis: Heparin subq Code Status: Full Family Communication: Pt in room, family not at bedside, did update pt's sister over phone 9/25  Status is: Inpatient  Remains inpatient appropriate because:Ongoing diagnostic testing needed not appropriate for outpatient work up and IV treatments appropriate due to intensity of illness or inability to take PO   Dispo: The patient is from: Home              Anticipated d/c is to: Home              Anticipated d/c date is: 3 days              Patient currently is not medically stable to d/c.  Consultants:     Procedures:     Antimicrobials: Anti-infectives (From admission, onward)   None      Subjective: Breathing better today  Objective: Vitals:   02/23/20 0513 02/23/20 0806 02/23/20 0915 02/23/20 1041  BP: (!) 165/74 (!) 153/75  (!) 152/76  Pulse: 64 74 74 78  Resp: 20 18 (!) 25 20  Temp: 97.9 F (36.6 C) 98.2 F (36.8 C)  97.6 F (36.4 C)  TempSrc: Oral Oral  Oral  SpO2: 98% 97% 96% 97%  Weight: 102.5 kg     Height:        Intake/Output Summary (Last 24 hours) at 02/23/2020 1333 Last data filed at 02/23/2020 1610 Gross per 24 hour  Intake 240 ml  Output 600 ml  Net -360 ml   Filed Weights   02/21/20 0152 02/22/20 0407 02/23/20 0513  Weight: 101.9 kg 103 kg 102.5 kg    Examination: General exam: Conversant, in no acute distress Respiratory system: normal chest rise, clear, no audible wheezing Cardiovascular system: regular rhythm, s1-s2 Gastrointestinal system: Nondistended, nontender, pos BS Central nervous system: No seizures, no tremors Extremities: No cyanosis, no joint deformities Skin: No rashes, no  pallor Psychiatry: Affect normal // no auditory hallucinations   Data Reviewed: I have personally reviewed following labs and imaging studies  CBC: Recent Labs  Lab 02/20/20 0923 02/21/20 0406  WBC 9.3 11.4*  HGB 9.8* 10.2*  HCT 32.4* 31.9*  MCV 89.3 86.9  PLT 292 960   Basic Metabolic Panel: Recent Labs  Lab 02/20/20 0923 02/20/20 1353 02/21/20 0406 02/22/20 1331 02/23/20 0222  NA 134*  --  133* 134* 134*  K 3.3*  --  3.9 3.9 3.8  CL 97*  --  96* 95* 96*  CO2 28  --  27 27 24   GLUCOSE 214*  --  220* 358* 197*  BUN 56*  --  63* 75* 85*  CREATININE 2.62* 2.72* 2.66* 3.04* 2.92*  CALCIUM 9.0  --  8.9 8.8* 9.0  MG  --  1.9  --   --   --    GFR: Estimated Creatinine Clearance: 17.3 mL/min (A) (by C-G formula based on SCr of 2.92 mg/dL (H)). Liver Function Tests: No results for input(s): AST, ALT, ALKPHOS, BILITOT, PROT, ALBUMIN in the last 168 hours. No results for input(s): LIPASE, AMYLASE in the last 168 hours.  No results for input(s): AMMONIA in the last 168 hours. Coagulation Profile: Recent Labs  Lab 02/20/20 1353  INR 1.2   Cardiac Enzymes: No results for input(s): CKTOTAL, CKMB, CKMBINDEX, TROPONINI in the last 168 hours. BNP (last 3 results) No results for input(s): PROBNP in the last 8760 hours. HbA1C: Recent Labs    02/21/20 0406  HGBA1C 7.3*   CBG: Recent Labs  Lab 02/22/20 1611 02/22/20 2041 02/23/20 0604 02/23/20 0950 02/23/20 1136  GLUCAP 262* 289* 209* 217* 286*   Lipid Profile: No results for input(s): CHOL, HDL, LDLCALC, TRIG, CHOLHDL, LDLDIRECT in the last 72 hours. Thyroid Function Tests: Recent Labs    02/20/20 1354  TSH 0.549   Anemia Panel: No results for input(s): VITAMINB12, FOLATE, FERRITIN, TIBC, IRON, RETICCTPCT in the last 72 hours. Sepsis Labs: No results for input(s): PROCALCITON, LATICACIDVEN in the last 168 hours.  Recent Results (from the past 240 hour(s))  Respiratory Panel by RT PCR (Flu A&B, Covid) -  Nasopharyngeal Swab     Status: None   Collection Time: 02/20/20 12:09 PM   Specimen: Nasopharyngeal Swab  Result Value Ref Range Status   SARS Coronavirus 2 by RT PCR NEGATIVE NEGATIVE Final    Comment: (NOTE) SARS-CoV-2 target nucleic acids are NOT DETECTED.  The SARS-CoV-2 RNA is generally detectable in upper respiratoy specimens during the acute phase of infection. The lowest concentration of SARS-CoV-2 viral copies this assay can detect is 131 copies/mL. A negative result does not preclude SARS-Cov-2 infection and should not be used as the sole basis for treatment or other patient management decisions. A negative result may occur with  improper specimen collection/handling, submission of specimen other than nasopharyngeal swab, presence of viral mutation(s) within the areas targeted by this assay, and inadequate number of viral copies (<131 copies/mL). A negative result must be combined with clinical observations, patient history, and epidemiological information. The expected result is Negative.  Fact Sheet for Patients:  PinkCheek.be  Fact Sheet for Healthcare Providers:  GravelBags.it  This test is no t yet approved or cleared by the [place] FDA and  has been authorized for detection and/or diagnosis of SARS-CoV-2 by FDA under an Emergency Use Authorization (EUA). This EUA will remain  in effect (meaning this test can be used) for the duration of the COVID-19 declaration under Section 564(b)(1) of the Act, 21 U.S.C. section 360bbb-3(b)(1), unless the authorization is terminated or revoked sooner.     Influenza A by PCR NEGATIVE NEGATIVE Final   Influenza B by PCR NEGATIVE NEGATIVE Final    Comment: (NOTE) The Xpert Xpress SARS-CoV-2/FLU/RSV assay is intended as an aid in  the diagnosis of influenza from Nasopharyngeal swab specimens and  should not be used as a sole basis for treatment. Nasal washings and   aspirates are unacceptable for Xpert Xpress SARS-CoV-2/FLU/RSV  testing.  Fact Sheet for Patients: PinkCheek.be  Fact Sheet for Healthcare Providers: GravelBags.it  This test is not yet approved or cleared by the [place] FDA and  has been authorized for detection and/or diagnosis of SARS-CoV-2 by  FDA under an Emergency Use Authorization (EUA). This EUA will remain  in effect (meaning this test can be used) for the duration of the  Covid-19 declaration under Section 564(b)(1) of the Act, 21  U.S.C. section 360bbb-3(b)(1), unless the authorization is  terminated or revoked. Performed at [facility], [place] [address] [place], [place] [address]      Radiology Studies: US RENAL  Result Date: 02/22/2020  CLINICAL DATA:  Acute renal failure. EXAM: RENAL / URINARY TRACT ULTRASOUND COMPLETE COMPARISON:  None. FINDINGS: Right Kidney: Renal measurements: 9.1 x 4.6 x 4.2 cm = volume: 90 mL. There is increased renal parenchymal echogenicity. Slight lobulated renal contours. No hydronephrosis or evidence of mass. Left Kidney: Renal measurements: 10.0 x 4.1 x 4.4 cm = volume: 95 mL. Increased parenchymal echogenicity. No hydronephrosis or evidence of mass. Bladder: Not visualized, patient voided prior to the exam. Other: Technically limited exam due to habitus. IMPRESSION: 1. No obstructive uropathy. 2. Increased bilateral renal parenchymal echogenicity consistent with chronic medical renal disease. 3. Bladder not visualized or evaluated, patient voided prior to the exam. Electronically Signed   By: Keith Rake M.D.   On: 02/22/2020 18:19   ECHOCARDIOGRAM COMPLETE  Result Date: 02/21/2020    ECHOCARDIOGRAM REPORT   Patient Name:   Amanda Cain Date of Exam: 02/21/2020 Medical Rec #:  [id number]        Height:       59.0 in Accession #:    [id number]       Weight:       224.6 lb Date of Birth:  01-06-1946         BSA:           1.938 m Patient Age:    70 years         BP:           185/84 mmHg Patient Gender: F                HR:           72 bpm. Exam Location:  Inpatient Procedure: 2D Echo, Cardiac Doppler and Color Doppler Indications:    Congestive Heart Failure 428.0 / I50.9  History:        Patient has prior history of Echocardiogram examinations, most                 recent 11/14/2017. CHF; Risk Factors:Diabetes and Hypertension.  Sonographer:    Bernadene Person RDCS Referring Phys: [id number] Mckinley Jewel  Sonographer Comments: patient refused definity. IMPRESSIONS  1. Left ventricular ejection fraction, by estimation, is 60 to 65%. The left ventricle has normal function. The left ventricle has no regional wall motion abnormalities. There is mild concentric left ventricular hypertrophy. Left ventricular diastolic parameters are consistent with Grade II diastolic dysfunction (pseudonormalization). Elevated left atrial pressure.  2. Right ventricular systolic function is normal. The right ventricular size is normal.  3. The mitral valve is degenerative. No evidence of mitral valve regurgitation. No evidence of mitral stenosis.  4. The aortic valve is tricuspid. There is mild calcification of the aortic valve. Aortic valve regurgitation is not visualized. Mild aortic valve sclerosis is present, with no evidence of aortic valve stenosis. Comparison(s): No significant change from prior study. FINDINGS  Left Ventricle: Left ventricular ejection fraction, by estimation, is 60 to 65%. The left ventricle has normal function. The left ventricle has no regional wall motion abnormalities. The left ventricular internal cavity size was normal in size. There is  mild concentric left ventricular hypertrophy. Left ventricular diastolic parameters are consistent with Grade II diastolic dysfunction (pseudonormalization). Elevated left atrial pressure. Right Ventricle: The right ventricular size is normal. No increase in right ventricular wall  thickness. Right ventricular systolic function is normal. Left Atrium: Left atrial size was normal in size. Right Atrium: Right atrial size was normal in size. Pericardium: Trivial pericardial effusion is present. Mitral Valve: The  mitral valve is degenerative in appearance. Mild mitral annular calcification. No evidence of mitral valve regurgitation. No evidence of mitral valve stenosis. Tricuspid Valve: The tricuspid valve is grossly normal. Tricuspid valve regurgitation is trivial. No evidence of tricuspid stenosis. Aortic Valve: The aortic valve is tricuspid. There is mild calcification of the aortic valve. Aortic valve regurgitation is not visualized. Mild aortic valve sclerosis is present, with no evidence of aortic valve stenosis. Pulmonic Valve: The pulmonic valve was grossly normal. Pulmonic valve regurgitation is not visualized. No evidence of pulmonic stenosis. Aorta: The aortic root and ascending aorta are structurally normal, with no evidence of dilitation. Venous: The inferior vena cava was not well visualized. IAS/Shunts: The atrial septum is grossly normal.  LEFT VENTRICLE PLAX 2D LVIDd:         4.60 cm  Diastology LVIDs:         2.60 cm  LV e' medial:    4.80 cm/s LV PW:         1.20 cm  LV E/e' medial:  32.5 LV IVS:        1.00 cm  LV e' lateral:   5.33 cm/s LVOT diam:     2.10 cm  LV E/e' lateral: 29.3 LV SV:         102 LV SV Index:   53 LVOT Area:     3.46 cm  RIGHT VENTRICLE RV S prime:     14.60 cm/s TAPSE (M-mode): 2.3 cm LEFT ATRIUM             Index       RIGHT ATRIUM           Index LA diam:        3.80 cm 1.96 cm/m  RA Area:     13.20 cm LA Vol (A2C):   92.0 ml 47.48 ml/m RA Volume:   26.90 ml  13.88 ml/m LA Vol (A4C):   75.5 ml 38.96 ml/m LA Biplane Vol: 85.2 ml 43.97 ml/m  AORTIC VALVE LVOT Vmax:   119.00 cm/s LVOT Vmean:  83.200 cm/s LVOT VTI:    0.295 m  AORTA Ao Root diam: 3.30 cm Ao Asc diam:  3.10 cm MITRAL VALVE                TRICUSPID VALVE MV Area (PHT): 3.48 cm     TR  Peak grad:   14.0 mmHg MV Decel Time: 218 msec     TR Vmax:        187.00 cm/s MV E velocity: 156.00 cm/s MV A velocity: 147.00 cm/s  SHUNTS MV E/A ratio:  1.06         Systemic VTI:  0.30 m                             Systemic Diam: 2.10 cm Eleonore Chiquito MD Electronically signed by Eleonore Chiquito MD Signature Date/Time: 02/21/2020/3:57:54 PM    Final     Scheduled Meds: . albuterol  2.5 mg Nebulization TID  . atorvastatin  80 mg Oral Daily  . benzonatate  100 mg Oral TID  . cholecalciferol  1,000 Units Oral Daily  . diltiazem  360 mg Oral Daily  . escitalopram  20 mg Oral QHS  . ezetimibe  10 mg Oral Daily  . fluticasone furoate-vilanterol  1 puff Inhalation Daily  . furosemide  60 mg Intravenous Q24H  . heparin  5,000 Units Subcutaneous Q8H  .  hydrALAZINE  25 mg Oral Q8H  . insulin aspart  0-15 Units Subcutaneous TID WC  . insulin aspart  0-5 Units Subcutaneous QHS  . insulin aspart  0-5 Units Subcutaneous QHS  . insulin aspart  8 Units Subcutaneous TID WC  . insulin glargine  25 Units Subcutaneous Daily  . levothyroxine  200 mcg Oral Q24H  . metoprolol succinate  100 mg Oral q morning - 10a  . montelukast  10 mg Oral QHS  . multivitamin with minerals  1 tablet Oral Daily  . paliperidone  3 mg Oral QHS  . pantoprazole  40 mg Oral Daily  . psyllium  1 packet Oral Daily  . saccharomyces boulardii  250 mg Oral BID  . sodium chloride flush  3 mL Intravenous Q12H  . sucralfate  1 g Oral BID   Continuous Infusions: . sodium chloride       LOS: 3 days   Marylu Lund, MD [facility] Pager On Amion  If 7PM-7AM, please contact night-coverage 02/23/2020, 1:33 PM

## 2020-02-23 NOTE — Progress Notes (Signed)
Mobility Specialist - Progress Note   02/23/20 1421  Mobility  Activity Ambulated in hall  Level of Assistance Modified independent, requires aide device or extra time  Assistive Device Front wheel walker  Distance Ambulated (ft) 40 ft  Mobility Response Tolerated poorly  Mobility performed by Mobility specialist  $Mobility charge 1 Mobility    Pre-mobility: 77 HR, 93% SpO2 During mobility: 86 HR, 87% SpO2 Post-mobility: 84 HR, 94% SpO2  Pt's SpO2 desaturated to 87% and she endorsed feeling lightheaded. She stated she felt like she needed to sit down, she was sat in an office chair and rolled back to her room. Pt ambulated on RA.   Pricilla Handler Mobility Specialist Mobility Specialist Phone: [phone]

## 2020-02-24 LAB — GLUCOSE, CAPILLARY
Glucose-Capillary: 147 mg/dL — ABNORMAL HIGH (ref 70–99)
Glucose-Capillary: 225 mg/dL — ABNORMAL HIGH (ref 70–99)
Glucose-Capillary: 70 mg/dL (ref 70–99)
Glucose-Capillary: 73 mg/dL (ref 70–99)
Glucose-Capillary: 194 mg/dL — ABNORMAL HIGH (ref 70–99)

## 2020-02-24 LAB — BASIC METABOLIC PANEL
Anion gap: 10 (ref 5–15)
BUN: 79 mg/dL — ABNORMAL HIGH (ref 8–23)
CO2: 28 mmol/L (ref 22–32)
Calcium: 9.2 mg/dL (ref 8.9–10.3)
Chloride: 98 mmol/L (ref 98–111)
Creatinine, Ser: 2.65 mg/dL — ABNORMAL HIGH (ref 0.44–1.00)
GFR calc Af Amer: 20 mL/min — ABNORMAL LOW (ref >60)
GFR calc non Af Amer: 17 mL/min — ABNORMAL LOW (ref >60)
Glucose, Bld: 127 mg/dL — ABNORMAL HIGH (ref 70–99)
Potassium: 3.4 mmol/L — ABNORMAL LOW (ref 3.5–5.1)
Sodium: 136 mmol/L (ref 135–145)

## 2020-02-24 MED ORDER — POTASSIUM CHLORIDE CRYS ER 20 MEQ PO TBCR
40.0000 meq | EXTENDED_RELEASE_TABLET | Freq: Once | ORAL | Status: AC
  Administered 2020-02-24: 40 meq via ORAL
  Filled 2020-02-24: qty 2

## 2020-02-24 MED ORDER — INSULIN GLARGINE 100 UNIT/ML ~~LOC~~ SOLN
20.0000 [IU] | Freq: Every day | SUBCUTANEOUS | Status: DC
  Administered 2020-02-24 – 2020-02-26 (×3): 20 [IU] via SUBCUTANEOUS
  Filled 2020-02-24 (×3): qty 0.2

## 2020-02-24 NOTE — Progress Notes (Signed)
Physical Therapy Treatment Patient Details Name: Amanda Cain MRN: [id number] DOB: [birth year] Today's Date: 02/24/2020    History of Present Illness 74 yo admitted with SOB, edema, CHF. PMHx:chronic hypoxemic respiratory failure-on 2 L PRN, HTN, HLD, DM, depression/anxiety, CKD stage IV, anemia of chronic disease, GERD, hypothyroidism, obesity    PT Comments    The pt is progressing well with therapy goals and endurance at this time as she was able to significantly increase total distance walked during today's session while on RA. The pt is able to maintain SpO2 in 90s on RA at rest, but requires a seated rest break with SpO2 in mid 80s following ~45 ft ambulation. The pt was able to complete multiple bouts of ambulation at this time, and will continue to benefit from skilled therapy to progress endurance, strength, and dynamic stability to facilitate return to the level of independence she will need to return to living alone.     Follow Up Recommendations  SNF;Supervision for mobility/OOB     Equipment Recommendations  None recommended by PT    Recommendations for Other Services       Precautions / Restrictions Precautions Precautions: Fall Precaution Comments: watch SpO2 Restrictions Weight Bearing Restrictions: No    Mobility  Bed Mobility Overal bed mobility: Needs Assistance             General bed mobility comments: in recliner at start and end of session  Transfers Overall transfer level: Needs assistance Equipment used: Rolling walker (2 wheeled) Transfers: Sit to/from Stand Sit to Stand: Supervision         General transfer comment: cues for hand placement, but able to stand from recliner without assist. struggles to scoot back in chair due to height.  Ambulation/Gait Ambulation/Gait assistance: Min guard Gait Distance (Feet): 45 Feet (+ 45 ft) Assistive device: Rolling walker (2 wheeled) Gait Pattern/deviations: Step-through pattern;Decreased  stride length;Trunk flexed;Shuffle Gait velocity: 0.22 m/s Gait velocity interpretation: <1.31 ft/sec, indicative of household ambulator General Gait Details: cues for positioning in RW, veyr short, shuffling steps     Balance Overall balance assessment: Needs assistance Sitting-balance support: Feet supported Sitting balance-Leahy Scale: Good     Standing balance support: Bilateral upper extremity supported;Single extremity supported Standing balance-Leahy Scale: Poor Standing balance comment: BUE support for gait                            Cognition Arousal/Alertness: Awake/alert Behavior During Therapy: WFL for tasks assessed/performed Overall Cognitive Status: Within Functional Limits for tasks assessed Area of Impairment: Safety/judgement                         Safety/Judgement: Decreased awareness of safety;Decreased awareness of deficits               General Comments General comments (skin integrity, edema, etc.): pt on RA, SpO2 high of 85% at rest, slow decline with continued gait. low of 85%, but pt recovered to >92% in 3 min      Pertinent Vitals/Pain Pain Assessment: No/denies pain    Home Living Family/patient expects to be discharged to:: Private residence Living Arrangements: Alone                      PT Goals (current goals can now be found in the care plan section) Acute Rehab PT Goals Patient Stated Goal: return home and be able to move more PT  Goal Formulation: With patient Time For Goal Achievement: 03/06/20 Potential to Achieve Goals: Good Progress towards PT goals: Progressing toward goals    Frequency    Min 3X/week      PT Plan Current plan remains appropriate       AM-PAC PT "6 Clicks" Mobility   Outcome Measure  Help needed turning from your back to your side while in a flat bed without using bedrails?: A Little Help needed moving from lying on your back to sitting on the side of a flat bed  without using bedrails?: A Little Help needed moving to and from a bed to a chair (including a wheelchair)?: A Little Help needed standing up from a chair using your arms (e.g., wheelchair or bedside chair)?: A Little Help needed to walk in hospital room?: A Little Help needed climbing 3-5 steps with a railing? : A Lot 6 Click Score: 17    End of Session Equipment Utilized During Treatment: Gait belt Activity Tolerance: Patient tolerated treatment well Patient left: in chair;with call bell/phone within reach;with chair alarm set Nurse Communication: Mobility status PT Visit Diagnosis: Other abnormalities of gait and mobility (R26.89);Difficulty in walking, not elsewhere classified (R26.2);Muscle weakness (generalized) (M62.81)     Time: 2761-8485 PT Time Calculation (min) (ACUTE ONLY): 25 min  Charges:  $Gait Training: 23-37 mins                     Karma Ganja, PT, DPT   Acute Rehabilitation Department Pager #: [phone]   Otho Bellows 02/24/2020, 4:25 PM

## 2020-02-24 NOTE — Progress Notes (Signed)
Mobility Specialist - Progress Note   02/24/20 1522  Mobility  Activity Refused mobility   Pt states she is too fatigued from PT for any kind of mobility.   Pricilla Handler Mobility Specialist Mobility Specialist Phone: [phone]

## 2020-02-24 NOTE — Progress Notes (Signed)
PROGRESS NOTE    Amanda Cain  UYQ:[id number] DOB: 14-Mar-1946 DOA: 02/20/2020 PCP: Mayra Neer, MD    Brief Narrative:  74 y.o. female with medical history significant of grade 2 diastolic dysfunction with preserved ejection fraction, chronic hypoxemic respiratory failure-on 2 L of oxygen as needed at home secondary to underlying mild persistent asthma, hypertension, hyperlipidemia, type 2 diabetes mellitus, depression/anxiety, CKD stage IV, anemia of chronic disease, GERD, hypothyroidism, obesity presents to emergency department with worsening shortness of breath and lower extremity edema.  Patient reports worsening shortness of breath and leg edema since 5 days.  Reports weight gain of 4 pounds, orthopnea, unable to walk due to swelling, associated with wheezing.  Has chronic cough with a sputum production of clear mucus, denies fever, chills, chest pain, palpitation, headache, blurry vision, nausea, vomiting, abdominal pain, urinary symptoms.  She uses oxygen only as needed at home.  She lives alone at home.  She is fully vaccinated against COVID-19.  No history of smoking, alcohol, illicit drug use.  She uses cane sometimes for ambulation at home.  She talked to her PCP regarding worsening leg swelling and was advised to increase torsemide dose for 2 days and encourage dietary modification-with little to no help.  She sees Dr. Irish Lack outpatient.  ED Course: Upon arrival to ED: Patient heart rate noted to be 58, blood pressure slightly elevated, on 2 L of oxygen via nasal cannula, afebrile with no leukocytosis, BMP shows sodium of 134, potassium 3.3, chloride 97, CKD stage IV, BNP: 119 (below baseline) initial troponin XVIII, COVID-19 pending.  Chest x-ray shows mild diffuse increased pulmonary interstitium bilaterally.  Patient was given albuterol, Atrovent inhaler, loading dose of Solu-Medrol and Lasix 40 IV once in ED.  [facility] consulted for admission for acute asthma  exacerbation and acute on chronic diastolic CHF.  Assessment & Plan:   Principal Problem:   Acute on chronic diastolic heart failure (HCC) Active Problems:   Hyponatremia   Type 2 diabetes mellitus with complication, without long-term current use of insulin (HCC)   Essential hypertension   Hypothyroidism, acquired   Dyslipidemia   GERD (gastroesophageal reflux disease)   CKD (chronic kidney disease), stage III   Morbid obesity (HCC)   Asthma   Hypokalemia   Anxiety   Acute on chronic diastolic (congestive) heart failure (HCC)    Acute on chronic diastolic CHF: -Patient has history of grade 2 diastolic CHF with preserved ejection fraction..  Reviewed echo from 2019.  Reports worsening shortness of breath, leg edema, weight gain.  Reviewed chest x-ray, BNP: 119. -Cont with strict INO's and daily weight. -Keep potassium more than 4, magnesium more than 2.  Recheck electrolytes. -Monitor kidney function and vitals closely. -TSH normal at 0.549 -TTE reviewed, findings of EF 60-65% -Pt reports dry weight of 223lbs (101kg). Wt today 100.8kg  Acute asthma exacerbation:  -Decreased BS on exam  -She is maintaining oxygen saturation on 2 L of oxygen via nasal cannula which is her baseline.   -COVID-19 is neg -No audible wheezing on exam this AM -Cont to wean steroids to off -Continue with incentive spirometry.  CKD stage IV:  -Mild declined.  Creatinine: 2.62, GFR: 20 (baseline creatinine: 2.35, GFR: 23) -Continue to monitor.  Hold nephrotoxic medication.   -Repeat BMP tomorrow a.m.  Type 2 diabetes mellitus: -Hold glimepiride, Invega, Januvia.   -Continue SSI coverage as needed -Continue on lantus and aspart and titrate as needed -A1c noted to be 7.3  Hyperlipidemia: Continue statin and  Zetia  Hypertension: Continue metoprolol, hydralazine, Cardizem  GERD: Continue PPI and Carafate  Hypothyroidism: TSH within normal limits.  Continue with levothyroxine as  tolerated  Hypokalemia: Could be secondary to albuterol breathing treatment and diuretics.  replaced. Recheck bmet in AM  Anemia of chronic disease: -Slightly below baseline.  H&H today is 9.8/34.4, was 10.6/34.41-year ago. -In the setting of CKD.  Stable at this time  DVT prophylaxis: Heparin subq Code Status: Full Family Communication: Pt in room, family not at bedside, did update pt's sister over phone 9/25  Status is: Inpatient  Remains inpatient appropriate because:Ongoing diagnostic testing needed not appropriate for outpatient work up and IV treatments appropriate due to intensity of illness or inability to take PO   Dispo: The patient is from: Home              Anticipated d/c is to: Home              Anticipated d/c date is: 3 days              Patient currently is not medically stable to d/c.  Consultants:     Procedures:     Antimicrobials: Anti-infectives (From admission, onward)   None      Subjective: Without complaints  Objective: Vitals:   02/24/20 0803 02/24/20 0836 02/24/20 1116 02/24/20 1413  BP: (!) 177/76  (!) 192/83   Pulse: 81 72 83 73  Resp: 20 18 20 17   Temp: 98.2 F (36.8 C)  98 F (36.7 C)   TempSrc: Oral  Oral   SpO2: 97% 99% 97% 99%  Weight:      Height:        Intake/Output Summary (Last 24 hours) at 02/24/2020 1437 Last data filed at 02/24/2020 0901 Gross per 24 hour  Intake 823 ml  Output --  Net 823 ml   Filed Weights   02/22/20 0407 02/23/20 0513 02/24/20 0626  Weight: 103 kg 102.5 kg 100.8 kg    Examination: General exam: Awake, laying in bed, in nad Respiratory system: Normal respiratory effort, no wheezing Cardiovascular system: regular rate, s1, s2 Gastrointestinal system: Soft, nondistended, positive BS Central nervous system: CN2-12 grossly intact, strength intact Extremities: Perfused, no clubbing, BLE edema Skin: Normal skin turgor, no notable skin lesions seen Psychiatry: Mood normal // no visual  hallucinations   Data Reviewed: I have personally reviewed following labs and imaging studies  CBC: Recent Labs  Lab 02/20/20 0923 02/21/20 0406  WBC 9.3 11.4*  HGB 9.8* 10.2*  HCT 32.4* 31.9*  MCV 89.3 86.9  PLT 292 275   Basic Metabolic Panel: Recent Labs  Lab 02/20/20 0923 02/20/20 0923 02/20/20 1353 02/21/20 0406 02/22/20 1331 02/23/20 0222 02/24/20 0307  NA 134*  --   --  133* 134* 134* 136  K 3.3*  --   --  3.9 3.9 3.8 3.4*  CL 97*  --   --  96* 95* 96* 98  CO2 28  --   --  27 27 24 28   GLUCOSE 214*  --   --  220* 358* 197* 127*  BUN 56*  --   --  63* 75* 85* 79*  CREATININE 2.62*   < > 2.72* 2.66* 3.04* 2.92* 2.65*  CALCIUM 9.0  --   --  8.9 8.8* 9.0 9.2  MG  --   --  1.9  --   --   --   --    < > = values in this interval  not displayed.   GFR: Estimated Creatinine Clearance: 18.9 mL/min (A) (by C-G formula based on SCr of 2.65 mg/dL (H)). Liver Function Tests: No results for input(s): AST, ALT, ALKPHOS, BILITOT, PROT, ALBUMIN in the last 168 hours. No results for input(s): LIPASE, AMYLASE in the last 168 hours. No results for input(s): AMMONIA in the last 168 hours. Coagulation Profile: Recent Labs  Lab 02/20/20 1353  INR 1.2   Cardiac Enzymes: No results for input(s): CKTOTAL, CKMB, CKMBINDEX, TROPONINI in the last 168 hours. BNP (last 3 results) No results for input(s): PROBNP in the last 8760 hours. HbA1C: No results for input(s): HGBA1C in the last 72 hours. CBG: Recent Labs  Lab 02/23/20 1136 02/23/20 1717 02/23/20 2050 02/24/20 0548 02/24/20 1128  GLUCAP 286* 248* 185* 147* 225*   Lipid Profile: No results for input(s): CHOL, HDL, LDLCALC, TRIG, CHOLHDL, LDLDIRECT in the last 72 hours. Thyroid Function Tests: No results for input(s): TSH, T4TOTAL, FREET4, T3FREE, THYROIDAB in the last 72 hours. Anemia Panel: No results for input(s): VITAMINB12, FOLATE, FERRITIN, TIBC, IRON, RETICCTPCT in the last 72 hours. Sepsis Labs: No results  for input(s): PROCALCITON, LATICACIDVEN in the last 168 hours.  Recent Results (from the past 240 hour(s))  Respiratory Panel by RT PCR (Flu A&B, Covid) - Nasopharyngeal Swab     Status: None   Collection Time: 02/20/20 12:09 PM   Specimen: Nasopharyngeal Swab  Result Value Ref Range Status   SARS Coronavirus 2 by RT PCR NEGATIVE NEGATIVE Final    Comment: (NOTE) SARS-CoV-2 target nucleic acids are NOT DETECTED.  The SARS-CoV-2 RNA is generally detectable in upper respiratoy specimens during the acute phase of infection. The lowest concentration of SARS-CoV-2 viral copies this assay can detect is 131 copies/mL. A negative result does not preclude SARS-Cov-2 infection and should not be used as the sole basis for treatment or other patient management decisions. A negative result may occur with  improper specimen collection/handling, submission of specimen other than nasopharyngeal swab, presence of viral mutation(s) within the areas targeted by this assay, and inadequate number of viral copies (<131 copies/mL). A negative result must be combined with clinical observations, patient history, and epidemiological information. The expected result is Negative.  Fact Sheet for Patients:  PinkCheek.be  Fact Sheet for Healthcare Providers:  GravelBags.it  This test is no t yet approved or cleared by the [place] FDA and  has been authorized for detection and/or diagnosis of SARS-CoV-2 by FDA under an Emergency Use Authorization (EUA). This EUA will remain  in effect (meaning this test can be used) for the duration of the COVID-19 declaration under Section 564(b)(1) of the Act, 21 U.S.C. section 360bbb-3(b)(1), unless the authorization is terminated or revoked sooner.     Influenza A by PCR NEGATIVE NEGATIVE Final   Influenza B by PCR NEGATIVE NEGATIVE Final    Comment: (NOTE) The Xpert Xpress SARS-CoV-2/FLU/RSV assay is  intended as an aid in  the diagnosis of influenza from Nasopharyngeal swab specimens and  should not be used as a sole basis for treatment. Nasal washings and  aspirates are unacceptable for Xpert Xpress SARS-CoV-2/FLU/RSV  testing.  Fact Sheet for Patients: PinkCheek.be  Fact Sheet for Healthcare Providers: GravelBags.it  This test is not yet approved or cleared by the [place] FDA and  has been authorized for detection and/or diagnosis of SARS-CoV-2 by  FDA under an Emergency Use Authorization (EUA). This EUA will remain  in effect (meaning this test can be used) for the duration  of the  Covid-19 declaration under Section 564(b)(1) of the Act, 21  U.S.C. section 360bbb-3(b)(1), unless the authorization is  terminated or revoked. Performed at [facility], [place] [address], [place],  [address]      Radiology Studies: US RENAL  Result Date: 02/22/2020 CLINICAL DATA:  Acute renal failure. EXAM: RENAL / URINARY TRACT ULTRASOUND COMPLETE COMPARISON:  None. FINDINGS: Right Kidney: Renal measurements: 9.1 x 4.6 x 4.2 cm = volume: 90 mL. There is increased renal parenchymal echogenicity. Slight lobulated renal contours. No hydronephrosis or evidence of mass. Left Kidney: Renal measurements: 10.0 x 4.1 x 4.4 cm = volume: 95 mL. Increased parenchymal echogenicity. No hydronephrosis or evidence of mass. Bladder: Not visualized, patient voided prior to the exam. Other: Technically limited exam due to habitus. IMPRESSION: 1. No obstructive uropathy. 2. Increased bilateral renal parenchymal echogenicity consistent with chronic medical renal disease. 3. Bladder not visualized or evaluated, patient voided prior to the exam. Electronically Signed   By: Keith Rake M.D.   On: 02/22/2020 18:19    Scheduled Meds: . albuterol  2.5 mg Nebulization TID  . atorvastatin  80 mg Oral Daily  . benzonatate  100 mg Oral TID  .  cholecalciferol  1,000 Units Oral Daily  . diltiazem  360 mg Oral Daily  . escitalopram  20 mg Oral QHS  . ezetimibe  10 mg Oral Daily  . fluticasone furoate-vilanterol  1 puff Inhalation Daily  . furosemide  60 mg Intravenous Q24H  . heparin  5,000 Units Subcutaneous Q8H  . hydrALAZINE  25 mg Oral Q8H  . insulin aspart  0-15 Units Subcutaneous TID WC  . insulin aspart  0-5 Units Subcutaneous QHS  . insulin aspart  0-5 Units Subcutaneous QHS  . insulin aspart  8 Units Subcutaneous TID WC  . insulin glargine  20 Units Subcutaneous Daily  . levothyroxine  200 mcg Oral Q24H  . metoprolol succinate  100 mg Oral q morning - 10a  . montelukast  10 mg Oral QHS  . multivitamin with minerals  1 tablet Oral Daily  . paliperidone  3 mg Oral QHS  . pantoprazole  40 mg Oral Daily  . psyllium  1 packet Oral Daily  . saccharomyces boulardii  250 mg Oral BID  . sodium chloride flush  3 mL Intravenous Q12H  . sucralfate  1 g Oral BID   Continuous Infusions: . sodium chloride       LOS: 4 days   Marylu Lund, MD [facility] Pager On Amion  If 7PM-7AM, please contact night-coverage 02/24/2020, 2:37 PM

## 2020-02-24 NOTE — Plan of Care (Signed)
  Problem: Education: Goal: Knowledge of General Education information will improve Description Including pain rating scale, medication(s)/side effects and non-pharmacologic comfort measures Outcome: Progressing   

## 2020-02-24 NOTE — Progress Notes (Signed)
Received call from telemetry and relayed that pt had tachyarrhythmia around 0632H onward, informed that pt was comfortably sitting in the couch, v/s at this time was stable with HR-70-80 bpm, pt on room air and pt claimed no chest discomfort at any kind- relayed conversation to Dwight

## 2020-02-25 DIAGNOSIS — F419 Anxiety disorder, unspecified: Secondary | ICD-10-CM

## 2020-02-25 LAB — BASIC METABOLIC PANEL WITH GFR
Anion gap: 10 (ref 5–15)
BUN: 67 mg/dL — ABNORMAL HIGH (ref 8–23)
CO2: 28 mmol/L (ref 22–32)
Calcium: 9.1 mg/dL (ref 8.9–10.3)
Chloride: 100 mmol/L (ref 98–111)
Creatinine, Ser: 2.39 mg/dL — ABNORMAL HIGH (ref 0.44–1.00)
GFR calc Af Amer: 22 mL/min — ABNORMAL LOW
GFR calc non Af Amer: 19 mL/min — ABNORMAL LOW
Glucose, Bld: 96 mg/dL (ref 70–99)
Potassium: 3.4 mmol/L — ABNORMAL LOW (ref 3.5–5.1)
Sodium: 138 mmol/L (ref 135–145)

## 2020-02-25 LAB — GLUCOSE, CAPILLARY
Glucose-Capillary: 82 mg/dL (ref 70–99)
Glucose-Capillary: 167 mg/dL — ABNORMAL HIGH (ref 70–99)
Glucose-Capillary: 92 mg/dL (ref 70–99)
Glucose-Capillary: 124 mg/dL — ABNORMAL HIGH (ref 70–99)

## 2020-02-25 MED ORDER — INFLUENZA VAC A&B SA ADJ QUAD 0.5 ML IM PRSY
0.5000 mL | PREFILLED_SYRINGE | INTRAMUSCULAR | Status: AC
  Administered 2020-02-26: 0.5 mL via INTRAMUSCULAR
  Filled 2020-02-25: qty 0.5

## 2020-02-25 MED ORDER — POTASSIUM CHLORIDE CRYS ER 20 MEQ PO TBCR
60.0000 meq | EXTENDED_RELEASE_TABLET | Freq: Once | ORAL | Status: AC
  Administered 2020-02-25: 60 meq via ORAL
  Filled 2020-02-25: qty 3

## 2020-02-25 NOTE — TOC Progression Note (Signed)
Transition of Care [facility]) - Progression Note    Patient Details  Name: Amanda Cain MRN: [id number] Date of Birth: 22-Aug-1945  Transition of Care [facility]) CM/SW [place], [place] Phone Number: 02/25/2020, 2:04 PM  Clinical Narrative:     CSW informed by MD of expected discharge tomorrow-CSW requested covid test. CSW contacted [facility] to advise of anticipated discharge tomorrow & confirm availability- waiting call back  Thurmond Butts, MSW, [place] Worker     Barriers to Discharge: Continued Medical Work up, Orthoptist and Services   In-house Referral: Clinical Social Work     Living arrangements for the past 2 months: Single Family Home                                       Social Determinants of Health (SDOH) Interventions    Readmission Risk Interventions No flowsheet data found.

## 2020-02-25 NOTE — Progress Notes (Signed)
Mobility Specialist: Progress Note   02/25/20 1413  Mobility  Activity Ambulated in room  Level of Assistance Independent  Assistive Device None  Distance Ambulated (ft) 60 ft ((+40ft, +58ft))  Mobility Response Tolerated well  Mobility performed by Mobility specialist  Bed Position Chair  $Mobility charge 1 Mobility   Pre-Mobility: 77 HR, 140/65 BP, 93% SpO2 During Mobility:   2 L/min [place]: 96 HR, 97% SpO2  1 L/min [place]: 86 HR, 95% SpO2 Post-Mobility:   1 L/min [place]: 84 HR, 131/62 BP, 96% SpO2  Pt said she was tired so pt ambulated in room. Pt began ambulation on 2 L/min [place] and walked 59ft in room then took a seated rest break. Pt then walked to door (+48ft) and sat down for a few seconds before walking back to chair (+47ft). Pt said she felt better after ambulating. Pt is now on RA with sats holding in the low 90s.   [facility] [place] Mobility Specialist

## 2020-02-25 NOTE — Progress Notes (Signed)
PROGRESS NOTE    Amanda Cain  ZTI:[id number] DOB: 12/16/1945 DOA: 02/20/2020 PCP: Mayra Neer, MD    Brief Narrative:  74 y.o. female with medical history significant of grade 2 diastolic dysfunction with preserved ejection fraction, chronic hypoxemic respiratory failure-on 2 L of oxygen as needed at home secondary to underlying mild persistent asthma, hypertension, hyperlipidemia, type 2 diabetes mellitus, depression/anxiety, CKD stage IV, anemia of chronic disease, GERD, hypothyroidism, obesity presents to emergency department with worsening shortness of breath and lower extremity edema.  Patient reports worsening shortness of breath and leg edema since 5 days.  Reports weight gain of 4 pounds, orthopnea, unable to walk due to swelling, associated with wheezing.  Has chronic cough with a sputum production of clear mucus, denies fever, chills, chest pain, palpitation, headache, blurry vision, nausea, vomiting, abdominal pain, urinary symptoms.  She uses oxygen only as needed at home.  She lives alone at home.  She is fully vaccinated against COVID-19.  No history of smoking, alcohol, illicit drug use.  She uses cane sometimes for ambulation at home.  She talked to her PCP regarding worsening leg swelling and was advised to increase torsemide dose for 2 days and encourage dietary modification-with little to no help.  She sees Dr. Irish Lack outpatient.  ED Course: Upon arrival to ED: Patient heart rate noted to be 58, blood pressure slightly elevated, on 2 L of oxygen via nasal cannula, afebrile with no leukocytosis, BMP shows sodium of 134, potassium 3.3, chloride 97, CKD stage IV, BNP: 119 (below baseline) initial troponin XVIII, COVID-19 pending.  Chest x-ray shows mild diffuse increased pulmonary interstitium bilaterally.  Patient was given albuterol, Atrovent inhaler, loading dose of Solu-Medrol and Lasix 40 IV once in ED.  [facility] consulted for admission for acute asthma  exacerbation and acute on chronic diastolic CHF.  Assessment & Plan:   Principal Problem:   Acute on chronic diastolic heart failure (HCC) Active Problems:   Hyponatremia   Type 2 diabetes mellitus with complication, without long-term current use of insulin (HCC)   Essential hypertension   Hypothyroidism, acquired   Dyslipidemia   GERD (gastroesophageal reflux disease)   CKD (chronic kidney disease), stage III   Morbid obesity (HCC)   Asthma   Hypokalemia   Anxiety   Acute on chronic diastolic (congestive) heart failure (HCC)    Acute on chronic diastolic CHF: -Patient has history of grade 2 diastolic CHF with preserved ejection fraction..  Reviewed echo from 2019.  Reports worsening shortness of breath, leg edema, weight gain.  Reviewed chest x-ray, BNP: 119. -Cont with strict INO's and daily weight. -Keep potassium more than 4, magnesium more than 2.  Recheck electrolytes. -Monitor kidney function and vitals closely. -TSH normal at 0.549 -TTE reviewed, findings of EF 60-65% -Pt reports dry weight of 223lbs (101kg). Wt today of 100.1kg is below pt's reported dry wt, however pt remains with BLE edema, thus will continue lasix as tolerated -Repeat bmet in AM  Acute asthma exacerbation:  -Initially had decreased BS on exam  -COVID-19 is neg -Now weaned to room air -Steroids weaned to off -Continue with incentive spirometry.  CKD stage IV:  -Mild declined.  Creatinine: 2.39 (baseline creatinine: 2.35, GFR: 23) -Continue to monitor.  Hold nephrotoxic medication.   -Repeat BMP tomorrow a.m.  Type 2 diabetes mellitus: -Hold glimepiride, Invega, Januvia.   -Continue SSI coverage as needed -Continue on lantus and aspart and titrate as needed -A1c noted to be 7.3  Hyperlipidemia: Continue statin and  Zetia as tolerated  Hypertension: Continue metoprolol, hydralazine, Cardizem  GERD: Continue PPI and Carafate  Hypothyroidism: TSH within normal limits.  Continue with  levothyroxine as pt tolerates  Hypokalemia: Could be secondary to albuterol breathing treatment and diuretics.  replaced. Repeat bmet in AM  Anemia of chronic disease: -Slightly below baseline.  H&H today is 9.8/34.4, was 10.6/34.41-year ago. -In the setting of CKD.  Stable currently  DVT prophylaxis: Heparin subq Code Status: Full Family Communication: Pt in room, family not at bedside, did update pt's sister over phone 9/25  Status is: Inpatient  Remains inpatient appropriate because:Ongoing diagnostic testing needed not appropriate for outpatient work up and IV treatments appropriate due to intensity of illness or inability to take PO   Dispo: The patient is from: Home              Anticipated d/c is to: SNF              Anticipated d/c date is: 1 day              Patient currently is not medically stable to d/c.  Consultants:     Procedures:     Antimicrobials: Anti-infectives (From admission, onward)   None      Subjective: Reports feeling better  Objective: Vitals:   02/25/20 0627 02/25/20 0817 02/25/20 1111 02/25/20 1541  BP: (!) 151/75 (!) 144/67 (!) 146/74 (!) 166/73  Pulse:  79 79 81  Resp:  (!) 22 17 17   Temp:  98.3 F (36.8 C) 98.1 F (36.7 C) 98.2 F (36.8 C)  TempSrc:  Oral Oral Oral  SpO2:  100% 100% 98%  Weight:      Height:        Intake/Output Summary (Last 24 hours) at 02/25/2020 1815 Last data filed at 02/25/2020 1700 Gross per 24 hour  Intake --  Output 850 ml  Net -850 ml   Filed Weights   02/23/20 0513 02/24/20 0626 02/25/20 0500  Weight: 102.5 kg 100.8 kg 100.1 kg    Examination: General exam: Conversant, in no acute distress Respiratory system: normal chest rise, clear, no audible wheezing Cardiovascular system: regular rhythm, s1-s2 Gastrointestinal system: Nondistended, nontender, pos BS Central nervous system: No seizures, no tremors Extremities: No cyanosis, no joint deformities, BLE edema Skin: No rashes, no  pallor Psychiatry: Affect normal // no auditory hallucinations    Data Reviewed: I have personally reviewed following labs and imaging studies  CBC: Recent Labs  Lab 02/20/20 0923 02/21/20 0406  WBC 9.3 11.4*  HGB 9.8* 10.2*  HCT 32.4* 31.9*  MCV 89.3 86.9  PLT 292 614   Basic Metabolic Panel: Recent Labs  Lab 02/20/20 0923 02/20/20 1353 02/21/20 0406 02/22/20 1331 02/23/20 0222 02/24/20 0307 02/25/20 0333  NA   < >  --  133* 134* 134* 136 138  K   < >  --  3.9 3.9 3.8 3.4* 3.4*  CL   < >  --  96* 95* 96* 98 100  CO2   < >  --  27 27 24 28 28   GLUCOSE   < >  --  220* 358* 197* 127* 96  BUN   < >  --  63* 75* 85* 79* 67*  CREATININE   < > 2.72* 2.66* 3.04* 2.92* 2.65* 2.39*  CALCIUM   < >  --  8.9 8.8* 9.0 9.2 9.1  MG  --  1.9  --   --   --   --   --    < > =  values in this interval not displayed.   GFR: Estimated Creatinine Clearance: 20.9 mL/min (A) (by C-G formula based on SCr of 2.39 mg/dL (H)). Liver Function Tests: No results for input(s): AST, ALT, ALKPHOS, BILITOT, PROT, ALBUMIN in the last 168 hours. No results for input(s): LIPASE, AMYLASE in the last 168 hours. No results for input(s): AMMONIA in the last 168 hours. Coagulation Profile: Recent Labs  Lab 02/20/20 1353  INR 1.2   Cardiac Enzymes: No results for input(s): CKTOTAL, CKMB, CKMBINDEX, TROPONINI in the last 168 hours. BNP (last 3 results) No results for input(s): PROBNP in the last 8760 hours. HbA1C: No results for input(s): HGBA1C in the last 72 hours. CBG: Recent Labs  Lab 02/24/20 1651 02/24/20 2127 02/25/20 0618 02/25/20 1109 02/25/20 1552  GLUCAP 70 194* 82 167* 92   Lipid Profile: No results for input(s): CHOL, HDL, LDLCALC, TRIG, CHOLHDL, LDLDIRECT in the last 72 hours. Thyroid Function Tests: No results for input(s): TSH, T4TOTAL, FREET4, T3FREE, THYROIDAB in the last 72 hours. Anemia Panel: No results for input(s): VITAMINB12, FOLATE, FERRITIN, TIBC, IRON, RETICCTPCT  in the last 72 hours. Sepsis Labs: No results for input(s): PROCALCITON, LATICACIDVEN in the last 168 hours.  Recent Results (from the past 240 hour(s))  Respiratory Panel by RT PCR (Flu A&B, Covid) - Nasopharyngeal Swab     Status: None   Collection Time: 02/20/20 12:09 PM   Specimen: Nasopharyngeal Swab  Result Value Ref Range Status   SARS Coronavirus 2 by RT PCR NEGATIVE NEGATIVE Final    Comment: (NOTE) SARS-CoV-2 target nucleic acids are NOT DETECTED.  The SARS-CoV-2 RNA is generally detectable in upper respiratoy specimens during the acute phase of infection. The lowest concentration of SARS-CoV-2 viral copies this assay can detect is 131 copies/mL. A negative result does not preclude SARS-Cov-2 infection and should not be used as the sole basis for treatment or other patient management decisions. A negative result may occur with  improper specimen collection/handling, submission of specimen other than nasopharyngeal swab, presence of viral mutation(s) within the areas targeted by this assay, and inadequate number of viral copies (<131 copies/mL). A negative result must be combined with clinical observations, patient history, and epidemiological information. The expected result is Negative.  Fact Sheet for Patients:  PinkCheek.be  Fact Sheet for Healthcare Providers:  GravelBags.it  This test is no t yet approved or cleared by the [place] FDA and  has been authorized for detection and/or diagnosis of SARS-CoV-2 by FDA under an Emergency Use Authorization (EUA). This EUA will remain  in effect (meaning this test can be used) for the duration of the COVID-19 declaration under Section 564(b)(1) of the Act, 21 U.S.C. section 360bbb-3(b)(1), unless the authorization is terminated or revoked sooner.     Influenza A by PCR NEGATIVE NEGATIVE Final   Influenza B by PCR NEGATIVE NEGATIVE Final    Comment:  (NOTE) The Xpert Xpress SARS-CoV-2/FLU/RSV assay is intended as an aid in  the diagnosis of influenza from Nasopharyngeal swab specimens and  should not be used as a sole basis for treatment. Nasal washings and  aspirates are unacceptable for Xpert Xpress SARS-CoV-2/FLU/RSV  testing.  Fact Sheet for Patients: PinkCheek.be  Fact Sheet for Healthcare Providers: GravelBags.it  This test is not yet approved or cleared by the [place] FDA and  has been authorized for detection and/or diagnosis of SARS-CoV-2 by  FDA under an Emergency Use Authorization (EUA). This EUA will remain  in effect (meaning this test can be  used) for the duration of the  Covid-19 declaration under Section 564(b)(1) of the Act, 21  U.S.C. section 360bbb-3(b)(1), unless the authorization is  terminated or revoked. Performed at [facility], [place] [address] [place], [place] [address]      Radiology Studies: No results found.  Scheduled Meds: . atorvastatin  80 mg Oral Daily  . benzonatate  100 mg Oral TID  . cholecalciferol  1,000 Units Oral Daily  . diltiazem  360 mg Oral Daily  . escitalopram  20 mg Oral QHS  . ezetimibe  10 mg Oral Daily  . fluticasone furoate-vilanterol  1 puff Inhalation Daily  . furosemide  60 mg Intravenous Q24H  . heparin  5,000 Units Subcutaneous Q8H  . hydrALAZINE  25 mg Oral Q8H  . [START ON 02/26/2020] influenza vaccine adjuvanted  0.5 mL Intramuscular Tomorrow-1000  . insulin aspart  0-15 Units Subcutaneous TID WC  . insulin aspart  0-5 Units Subcutaneous QHS  . insulin aspart  8 Units Subcutaneous TID WC  . insulin glargine  20 Units Subcutaneous Daily  . levothyroxine  200 mcg Oral Q24H  . metoprolol succinate  100 mg Oral q morning - 10a  . montelukast  10 mg Oral QHS  . multivitamin with minerals  1 tablet Oral Daily  . paliperidone  3 mg Oral QHS  . pantoprazole  40 mg Oral Daily  . psyllium  1  packet Oral Daily  . saccharomyces boulardii  250 mg Oral BID  . sodium chloride flush  3 mL Intravenous Q12H  . sucralfate  1 g Oral BID   Continuous Infusions: . sodium chloride       LOS: 5 days   Marylu Lund, MD [facility] Pager On Amion  If 7PM-7AM, please contact night-coverage 02/25/2020, 6:15 PM

## 2020-02-25 NOTE — Progress Notes (Signed)
Physical Therapy Treatment Patient Details Name: Amanda Cain MRN: [id number] DOB: March 09, 1946 Today's Date: 02/25/2020    History of Present Illness 74 yo admitted with SOB, edema, CHF. PMHx:chronic hypoxemic respiratory failure-on 2 L PRN, HTN, HLD, DM, depression/anxiety, CKD stage IV, anemia of chronic disease, GERD, hypothyroidism, obesity    PT Comments    Pt was seen for mobility on room air, was working on LE strengthening before deciding to walk after all.  Pt was apparently up all evening over BP elevation, now sleepy and reluctant to walk far.  However, her sats were maintained on room air for short walking trip and was able to be assisted with cues to wake her up to do LE strengthening.  Follow up acutely for these strengthening needs, and will expect a transition to SNF as she continues to recover.     Follow Up Recommendations  SNF     Equipment Recommendations  None recommended by PT    Recommendations for Other Services       Precautions / Restrictions Precautions Precautions: Fall Precaution Comments: watch SpO2 Restrictions Weight Bearing Restrictions: No Other Position/Activity Restrictions: LE edema    Mobility  Bed Mobility               General bed mobility comments: in chair when PT arrived  Transfers Overall transfer level: Needs assistance Equipment used: None Transfers: Sit to/from Stand Sit to Stand: Supervision         General transfer comment: Pt told PT she is fine to stand and walk without help in room  Ambulation/Gait Ambulation/Gait assistance: Min guard Gait Distance (Feet): 12 Feet (6 x 2) Assistive device: None Gait Pattern/deviations: Step-through pattern;Step-to pattern;Wide base of support;Trunk flexed   Gait velocity interpretation:  (slower than household) General Gait Details: Pt needs AD for gait quality but jumped up to walk wiht no AD voluntarily, stated she didnot need AD in her room    Stairs              Wheelchair Mobility    Modified [place] (Stroke Patients Only)       Balance Overall balance assessment: Needs assistance Sitting-balance support: Feet supported Sitting balance-Leahy Scale: Good     Standing balance support: No upper extremity supported Standing balance-Leahy Scale: Fair Standing balance comment: needs support dynamically to avoid poorer quality gait                            Cognition Arousal/Alertness: Lethargic Behavior During Therapy: Flat affect Overall Cognitive Status: Within Functional Limits for tasks assessed Area of Impairment: Attention;Problem solving                   Current Attention Level: Selective     Safety/Judgement: Decreased awareness of safety   Problem Solving: Decreased initiation;Slow processing General Comments: Pt is sleepy but reports BP was up last night and managing it kept her awake      Exercises General Exercises - Lower Extremity Ankle Circles/Pumps: AAROM;5 reps Quad Sets: AROM;10 reps Heel Slides: AROM;AAROM;10 reps Hip ABduction/ADduction: AAROM;AROM;10 reps Straight Leg Raises: AAROM;5 reps    General Comments General comments (skin integrity, edema, etc.): Pt was assessed with sats pregait and was 99%.  Walking short trips in room, down to 94% post      Pertinent Vitals/Pain Pain Assessment: No/denies pain    Home Living  Prior Function            PT Goals (current goals can now be found in the care plan section) Acute Rehab PT Goals Patient Stated Goal: return home and be able to move more PT Goal Formulation: With patient Progress towards PT goals: Progressing toward goals    Frequency    Min 3X/week      PT Plan Current plan remains appropriate    Co-evaluation              AM-PAC PT "6 Clicks" Mobility   Outcome Measure  Help needed turning from your back to your side while in a flat bed without using bedrails?:  None Help needed moving from lying on your back to sitting on the side of a flat bed without using bedrails?: A Little Help needed moving to and from a bed to a chair (including a wheelchair)?: A Little Help needed standing up from a chair using your arms (e.g., wheelchair or bedside chair)?: A Little Help needed to walk in hospital room?: A Little Help needed climbing 3-5 steps with a railing? : A Lot 6 Click Score: 18    End of Session Equipment Utilized During Treatment: Gait belt Activity Tolerance: Patient tolerated treatment well Patient left: in chair;with call bell/phone within reach;with chair alarm set Nurse Communication: Mobility status PT Visit Diagnosis: Other abnormalities of gait and mobility (R26.89);Difficulty in walking, not elsewhere classified (R26.2);Muscle weakness (generalized) (M62.81)     Time: 7824-2353 PT Time Calculation (min) (ACUTE ONLY): 28 min  Charges:  $Gait Training: 8-22 mins $Therapeutic Exercise: 8-22 mins                    Amanda Cain 02/25/2020, 2:29 PM  Mee Hives, PT MS Acute Rehab Dept. Number: [place] and [place]

## 2020-02-25 NOTE — NC FL2 (Signed)
[place] LEVEL OF CARE SCREENING TOOL     IDENTIFICATION  Patient Name: Amanda Cain Birthdate: November 23, 1945 Sex: female Admission Date (Current Location): 02/20/2020  [place] and [place] Number:  Herbalist and Address:  [facility] [place], [place] [address], [place], [place] [address]      Provider Number: [id number]  Attending Physician Name and Address:  Donne Hazel, MD  Relative Name and Phone Number:  [name] Stevens [phone]    Current Level of Care: SNF Recommended Level of Care: [place] Prior Approval Number:    Date Approved/Denied:   PASRR Number: [id number]  Discharge Plan: SNF    Current Diagnoses: Patient Active Problem List   Diagnosis Date Noted   Acute on chronic diastolic (congestive) heart failure ([place]) 02/20/2020   Hypokalemia    Anxiety    Physical deconditioning 03/13/2019   Acute recurrent maxillary sinusitis 03/13/2019   Pain due to onychomycosis of toenails of both feet 11/21/2018   Shortness of breath 08/09/2018   Asthma 06/19/2018   Chronic respiratory failure with hypoxia ([place]) 05/15/2018   Pulmonary hypertension ([place]) 05/14/2018   Atypical chest pain 05/12/2018   Community acquired pneumonia 12/13/2017   CAP (community acquired pneumonia) 12/13/2017   CHF (congestive heart failure) ([place]) 12/12/2017   Morbid obesity ([place]) 11/21/2017   Flu 07/07/2017   Influenza A 07/05/2017   Acute respiratory failure with hypoxia ([place]) 07/05/2017   Ascites    Acute on chronic diastolic heart failure ([place]) 11/17/2016   CKD (chronic kidney disease), stage III 11/17/2016   S/P laparoscopic cholecystectomy 03/02/2015   Hyperlipidemia 11/25/2012   Depression 11/25/2012   GERD (gastroesophageal reflux disease) 11/25/2012   Postoperative anemia due to acute blood loss 11/01/2012   Osteoarthritis of right knee 10/31/2012   Hyperglycemia 11/30/2011   SBP  (spontaneous bacterial peritonitis) ([place]) 11/25/2011   Syncope and collapse 11/20/2011   Abdominal pain 11/19/2011   Hyponatremia 11/19/2011   Anemia 11/19/2011   Acute kidney injury superimposed on CKD ([place]) 11/19/2011   Moderate protein-calorie malnutrition ([place]) 11/19/2011   Type 2 diabetes mellitus with complication, without long-term current use of insulin ([place]) 11/19/2011   Essential hypertension 11/19/2011   Hypothyroidism, acquired 11/19/2011   Dyslipidemia 11/19/2011    Orientation RESPIRATION BLADDER Height & Weight     Self, Time, Situation, Place  Normal Continent Weight: 220 lb 9.6 oz (100.1 kg) Height:  4\' 11"  (149.9 cm)  BEHAVIORAL SYMPTOMS/MOOD NEUROLOGICAL BOWEL NUTRITION STATUS      Continent Diet (please see discharge summary)  AMBULATORY STATUS COMMUNICATION OF NEEDS Skin     Verbally Normal                       Personal Care Assistance Level of Assistance  Bathing, Feeding, Dressing Bathing Assistance: Limited assistance Feeding assistance: Independent Dressing Assistance: Limited assistance     Functional Limitations Info  Sight, Hearing, Speech Sight Info: Impaired Hearing Info: Adequate Speech Info: Adequate    SPECIAL CARE FACTORS FREQUENCY  OT (By licensed OT), PT (By licensed PT)     PT Frequency: 5x per week OT Frequency: 5x per week            Contractures Contractures Info: Not present    Additional Factors Info  Code Status, Allergies Code Status Info: FULL Allergies Info: Amlodipine,Aspirin           Current Medications (02/25/2020):  This is the current hospital active medication  list Current Facility-Administered Medications  Medication Dose Route Frequency Provider Last Rate Last Admin   0.9 %  sodium chloride infusion  250 mL Intravenous PRN Pahwani, Rinka R, MD       acetaminophen (TYLENOL) tablet 650 mg  650 mg Oral Q4H PRN Pahwani, Rinka R, MD   650 mg at 02/20/20 1447   albuterol (PROVENTIL) (2.5  MG/3ML) 0.083% nebulizer solution 2.5 mg  2.5 mg Nebulization Q2H PRN Pahwani, Rinka R, MD       atorvastatin (LIPITOR) tablet 80 mg  80 mg Oral Daily Pahwani, Rinka R, MD   80 mg at 02/25/20 9371   benzonatate (TESSALON) capsule 100 mg  100 mg Oral TID Vianne Bulls, MD   100 mg at 02/25/20 6967   cholecalciferol (VITAMIN D3) tablet 1,000 Units  1,000 Units Oral Daily Donne Hazel, MD   1,000 Units at 02/25/20 0835   diltiazem (CARDIZEM CD) 24 hr capsule 360 mg  360 mg Oral Daily Pahwani, Rinka R, MD   360 mg at 02/25/20 0836   escitalopram (LEXAPRO) tablet 20 mg  20 mg Oral QHS Pahwani, Rinka R, MD   20 mg at 02/24/20 2035   ezetimibe (ZETIA) tablet 10 mg  10 mg Oral Daily Pahwani, Rinka R, MD   10 mg at 02/25/20 0835   fluticasone furoate-vilanterol (BREO ELLIPTA) 200-25 MCG/INH 1 puff  1 puff Inhalation Daily Pahwani, Rinka R, MD   1 puff at 02/25/20 0837   furosemide (LASIX) injection 60 mg  60 mg Intravenous Q24H Donne Hazel, MD   60 mg at 02/25/20 0838   guaiFENesin (MUCINEX) 12 hr tablet 600 mg  600 mg Oral BID PRN Vianne Bulls, MD   600 mg at 02/21/20 0250   heparin injection 5,000 Units  5,000 Units Subcutaneous Q8H Pahwani, Rinka R, MD   5,000 Units at 02/25/20 1400   hydrALAZINE (APRESOLINE) injection 5 mg  5 mg Intravenous Q4H PRN Donne Hazel, MD   5 mg at 02/25/20 0430   hydrALAZINE (APRESOLINE) tablet 25 mg  25 mg Oral Q8H Pahwani, Rinka R, MD   25 mg at 02/25/20 8938   insulin aspart (novoLOG) injection 0-15 Units  0-15 Units Subcutaneous TID WC Donne Hazel, MD   3 Units at 02/25/20 1238   insulin aspart (novoLOG) injection 0-5 Units  0-5 Units Subcutaneous QHS Donne Hazel, MD   3 Units at 02/22/20 2216   insulin aspart (novoLOG) injection 8 Units  8 Units Subcutaneous TID WC Donne Hazel, MD   8 Units at 02/25/20 1238   insulin glargine (LANTUS) injection 20 Units  20 Units Subcutaneous Daily Donne Hazel, MD   20 Units at 02/25/20 0845    levothyroxine (SYNTHROID) tablet 200 mcg  200 mcg Oral Q24H Pahwani, Rinka R, MD   200 mcg at 02/25/20 0535   lip balm (BLISTEX) ointment 1 application  1 application Topical PRN Donne Hazel, MD       LORazepam (ATIVAN) tablet 0.5 mg  0.5 mg Oral Q8H PRN Donne Hazel, MD   0.5 mg at 02/24/20 0905   metoprolol succinate (TOPROL-XL) 24 hr tablet 100 mg  100 mg Oral q morning - 10a Pahwani, Rinka R, MD   100 mg at 02/25/20 0835   montelukast (SINGULAIR) tablet 10 mg  10 mg Oral QHS Donne Hazel, MD   10 mg at 02/24/20 2034   multivitamin with minerals tablet 1 tablet  1  tablet Oral Daily Donne Hazel, MD   1 tablet at 02/25/20 0836   ondansetron (ZOFRAN) injection 4 mg  4 mg Intravenous Q6H PRN Pahwani, Rinka R, MD       paliperidone (INVEGA) 24 hr tablet 3 mg  3 mg Oral QHS Donne Hazel, MD   3 mg at 02/24/20 2035   pantoprazole (PROTONIX) EC tablet 40 mg  40 mg Oral Daily Pahwani, Rinka R, MD   40 mg at 02/25/20 0836   polyvinyl alcohol (LIQUIFILM TEARS) 1.4 % ophthalmic solution 1 drop  1 drop Both Eyes Daily PRN Donne Hazel, MD   1 drop at 02/23/20 2200   psyllium (HYDROCIL/METAMUCIL) 1 packet  1 packet Oral Daily Donne Hazel, MD   1 packet at 02/25/20 0845   saccharomyces boulardii (FLORASTOR) capsule 250 mg  250 mg Oral BID Donne Hazel, MD   250 mg at 02/25/20 0835   sodium chloride flush (NS) 0.9 % injection 3 mL  3 mL Intravenous Q12H Pahwani, Rinka R, MD   3 mL at 02/25/20 0838   sodium chloride flush (NS) 0.9 % injection 3 mL  3 mL Intravenous PRN Pahwani, Rinka R, MD       sucralfate (CARAFATE) tablet 1 g  1 g Oral BID Pahwani, Rinka R, MD   1 g at 02/25/20 8588     Discharge Medications: Please see discharge summary for a list of discharge medications.  Relevant Imaging Results:  Relevant Lab Results:   Additional Information SSN [id number]  Vinie Sill, [place]

## 2020-02-25 NOTE — Plan of Care (Signed)
Nutrition Education Note  RD consulted for nutrition education regarding new onset CHF.  RD provided "Low Sodium Nutrition Therapy" handout from the Academy of [employer]. Reviewed patient's dietary recall. Provided examples on ways to decrease sodium intake in diet. Discouraged intake of processed foods and use of salt shaker. Encouraged fresh fruits and vegetables as well as whole grain sources of carbohydrates to maximize fiber intake.   RD discussed why it is important for patient to adhere to diet recommendations, and emphasized the role of fluids, foods to avoid, and importance of weighing self daily. Teach back method used.  Patient endorses eating canned foods and using "Season all" to season meats. States she used to use Mrs. Dash seasoning but stopped liking the taste. RD discouraged use of Season all and canned vegetables given high sodium content. Discussed substitutions that could be make with seasoning, daily sodium limit, and how to read food labels. Encouraged use of fresh or frozen vegetables.   Expect fair compliance.  Current diet order is heart healthy/carb modified, patient is consuming approximately 75-100% of meals at this time. Labs and medications reviewed. No further nutrition interventions warranted at this time. RD contact information provided. If additional nutrition issues arise, please re-consult RD.   Mariana Single RD, LDN Clinical Nutrition Pager listed in [place]

## 2020-02-26 LAB — COMPREHENSIVE METABOLIC PANEL
ALT: 20 U/L (ref 0–44)
AST: 22 U/L (ref 15–41)
Albumin: 2.6 g/dL — ABNORMAL LOW (ref 3.5–5.0)
Alkaline Phosphatase: 54 U/L (ref 38–126)
Anion gap: 9 (ref 5–15)
BUN: 65 mg/dL — ABNORMAL HIGH (ref 8–23)
CO2: 28 mmol/L (ref 22–32)
Calcium: 8.7 mg/dL — ABNORMAL LOW (ref 8.9–10.3)
Chloride: 98 mmol/L (ref 98–111)
Creatinine, Ser: 2.64 mg/dL — ABNORMAL HIGH (ref 0.44–1.00)
GFR calc Af Amer: 20 mL/min — ABNORMAL LOW (ref >60)
GFR calc non Af Amer: 17 mL/min — ABNORMAL LOW (ref >60)
Glucose, Bld: 134 mg/dL — ABNORMAL HIGH (ref 70–99)
Potassium: 4.6 mmol/L (ref 3.5–5.1)
Sodium: 135 mmol/L (ref 135–145)
Total Bilirubin: 0.8 mg/dL (ref 0.3–1.2)
Total Protein: 5.4 g/dL — ABNORMAL LOW (ref 6.5–8.1)

## 2020-02-26 LAB — MAGNESIUM: Magnesium: 2 mg/dL (ref 1.7–2.4)

## 2020-02-26 LAB — SARS CORONAVIRUS 2 BY RT PCR (HOSPITAL ORDER, PERFORMED IN ~~LOC~~ HOSPITAL LAB): SARS Coronavirus 2: NEGATIVE

## 2020-02-26 LAB — GLUCOSE, CAPILLARY
Glucose-Capillary: 132 mg/dL — ABNORMAL HIGH (ref 70–99)
Glucose-Capillary: 208 mg/dL — ABNORMAL HIGH (ref 70–99)

## 2020-02-26 NOTE — Discharge Summary (Signed)
Amanda Cain TGG:[id number] DOB: [birth year] DOA: 02/20/2020  PCP: Mayra Neer, MD  Admit date: 02/20/2020  Discharge date: 02/26/2020  Admitted From: Home   disposition: Home   Recommendations for Outpatient Follow-up:   Follow up with PCP in 1 week.   Home Health: Home health aide, RN face-to-face Equipment/Devices: N/A Consultations: None Discharge Condition: Improved CODE STATUS: Full Diet Recommendation: Heart Healthy low-sodium  Diet Order            Diet - low sodium heart healthy           Diet heart healthy/carb modified Room service appropriate? Yes with Assist; Fluid consistency: Thin  Diet effective now                  Chief Complaint  Patient presents with  . Shortness of Breath  . Leg Swelling     Brief history of present illness from the day of admission and additional interim summary    74 y.o.femalewith medical history significant ofgrade 2 diastolic dysfunction with preserved ejection fraction, chronic hypoxemic respiratory failure-on 2 L of oxygen as needed at home secondary to underlying mild persistent asthma, hypertension, hyperlipidemia, type 2 diabetes mellitus, depression/anxiety, CKD stage IV, anemia of chronic disease, GERD, hypothyroidism, obesity presents to emergency department with worsening shortness of breath and lower extremity edema.  Patient reports worsening shortness of breath and leg edema since 5 days. Reports weight gain of 4 pounds, orthopnea, unable to walk due to swelling, associated with wheezing. Has chronic cough with a sputum production of clear mucus, denies fever, chills, chest pain, palpitation, headache, blurry vision, nausea, vomiting, abdominal pain, urinary symptoms.  She uses oxygen only as needed at home. She lives alone at home.  She is fully vaccinated against COVID-19. No history of smoking, alcohol, illicit drug use. She uses cane sometimes for ambulation at home. She talked to her PCP regarding worsening leg swelling and was advised to increase torsemide dose for 2 days and encourage dietary modification-with little to no help. She sees Dr. Irish Lack outpatient.  Upon arrival to ED: Patient heart rate noted to be 58, blood pressure slightly elevated, on 2 L of oxygen via nasal cannula, afebrile with no leukocytosis, BMP shows sodium of 134, potassium 3.3, chloride 97, CKD stage IV, BNP: 119(below baseline)initial troponin XVIII, COVID-19 pending. Chest x-ray shows mild diffuse increased pulmonary interstitium bilaterally. Patient was given albuterol, Atrovent inhaler, loading dose of Solu-Medrol and Lasix 40 IV once in ED. [facility] consulted for admission for acute asthma exacerbation and acute on chronic diastolic CHF.                                                                 Hospital Course   Patient was treated for multifactorial acute on chronic respiratory failure in  house.  Her acute on chronic diastolic heart dysfunction was treated with IV Lasix with good effect.  Her lower extremity edema decreased as did her weight.  Her kidney function improved with the change in Starling forces.  Patient's COPD exacerbation was also treated with steroids and inhaled bronchodilators.  Patient progressively improved with treatment as noted above and on day of discharge stated she felt much much better than when she was admitted.  Acute on chronic diastolic CHF: -Patient has history of grade 2 diastolic CHF with preserved ejection fraction..  -Cont with strict INO's and daily weight. -TTE reviewed, findings of EF 60-65% -Pt reports dry weight of 223lbs (101kg). Wt today of 100.1kg is below pt's reported dry wt, however pt remains with BLE edema. -Continue home doses of torsemide without change.  Patient to  follow-up with her PCP for outpatient management as tolerated -Refer to heart failure clinic.  Acute asthma exacerbation:  -COVID-19 is neg -Now weaned to room air with prn O2 2 liters per home baseline -Continue home regimen of inhaled bronchodilators   CKD stage IV: -Improved with shift and Starling forces with diuresis.  Type 2 diabetes mellitus: -Continue home medications  HL -Continue statin and Zetia as tolerated  Hypertension -Continue metoprolol, hydralazine, Cardizem  GERD -Continue PPI and Carafate   Discharge diagnosis     Principal Problem:   Acute on chronic diastolic heart failure (HCC) Active Problems:   Hyponatremia   Type 2 diabetes mellitus with complication, without long-term current use of insulin (HCC)   Essential hypertension   Hypothyroidism, acquired   Dyslipidemia   GERD (gastroesophageal reflux disease)   CKD (chronic kidney disease), stage III   Morbid obesity (HCC)   Asthma   Hypokalemia   Anxiety   Acute on chronic diastolic (congestive) heart failure [facility])    Discharge instructions    Discharge Instructions    (HEART FAILURE PATIENTS) Call MD:  Anytime you have any of the following symptoms: 1) 3 pound weight gain in 24 hours or 5 pounds in 1 week 2) shortness of breath, with or without a dry hacking cough 3) swelling in the hands, feet or stomach 4) if you have to sleep on extra pillows at night in order to breathe.   Complete by: As directed    AMB referral to CHF clinic   Complete by: As directed    Diet - low sodium heart healthy   Complete by: As directed    Discharge instructions   Complete by: As directed    1.  Make sure to take all your medications as prescribed. 2.  Make sure you avoid any salt in your food. 3. Weigh yourself daily and call your doctor if you gain more than 3 pounds overnight. 4.  Make an appointment to see your PCP in 5 to 7 days to see how you are doing. 5. I am referring you to CHF clinic so  you can keep your heart function under better control.   Increase activity slowly   Complete by: As directed       Discharge Medications   Allergies as of 02/26/2020      Reactions   Amlodipine    Dizziness per patient   Aspirin Other (See Comments)   " Have an ulcer"      Medication List    STOP taking these medications   cefdinir 300 MG capsule Commonly known as: OMNICEF   tiZANidine 2 MG tablet Commonly known as: ZANAFLEX  TAKE these medications   acetaminophen 500 MG tablet Commonly known as: TYLENOL Take 500 mg by mouth daily as needed for mild pain.   albuterol 108 (90 Base) MCG/ACT inhaler Commonly known as: VENTOLIN HFA Inhale 2 puffs into the lungs every 4 (four) hours as needed for wheezing or shortness of breath.   atorvastatin 80 MG tablet Commonly known as: LIPITOR Take 80 mg by mouth daily.   azelastine 0.1 % nasal spray Commonly known as: ASTELIN Place 1 spray into both nostrils 2 (two) times daily. Use in each nostril as directed   benzonatate 100 MG capsule Commonly known as: TESSALON Take 100 mg by mouth 3 (three) times daily.   Biotin 1000 MCG tablet Take 1,000 mcg by mouth daily.   BLISTEX COMPLETE MOISTURE EX Apply 1 application topically as needed (dry, cracked lips).   Breo Ellipta 200-25 MCG/INH Aepb Generic drug: fluticasone furoate-vilanterol INHALE 1 PUFF INTO THE LUNGS DAILY What changed: See the new instructions.   cholecalciferol 1000 units tablet Commonly known as: VITAMIN D Take 1,000 Units by mouth daily.   diltiazem 360 MG 24 hr capsule Commonly known as: CARDIZEM CD Take 360 mg by mouth daily.   escitalopram 20 MG tablet Commonly known as: LEXAPRO Take 20 mg by mouth at bedtime.   estradiol 0.5 MG tablet Commonly known as: ESTRACE Take 0.5 mg by mouth every morning.   ezetimibe 10 MG tablet Commonly known as: ZETIA Take 10 mg by mouth daily.   glimepiride 1 MG tablet Commonly known as: AMARYL Take 1  mg by mouth daily with breakfast.   guaiFENesin 600 MG 12 hr tablet Commonly known as: MUCINEX Take 600 mg by mouth 2 (two) times daily.   hydrALAZINE 25 MG tablet Commonly known as: APRESOLINE Take 1 tablet (25 mg total) by mouth every 8 (eight) hours.   levothyroxine 200 MCG tablet Commonly known as: SYNTHROID Take 200 mcg by mouth every morning.   LORazepam 0.5 MG tablet Commonly known as: ATIVAN Take 0.5 mg by mouth at bedtime.   metoprolol succinate 100 MG 24 hr tablet Commonly known as: TOPROL-XL Take 100 mg by mouth every morning.   montelukast 10 MG tablet Commonly known as: SINGULAIR TAKE 1 TABLET(10 MG) BY MOUTH AT BEDTIME What changed:   See the new instructions.  Another medication with the same name was removed. Continue taking this medication, and follow the directions you see here.   multivitamin with minerals Tabs tablet Take 1 tablet by mouth daily.   ondansetron 4 MG disintegrating tablet Commonly known as: ZOFRAN-ODT Take 4 mg by mouth every 8 (eight) hours as needed for nausea or vomiting.   onetouch ultrasoft lancets SMARTSIG:1 Lancet(s) Via Meter Daily   paliperidone 3 MG 24 hr tablet Commonly known as: INVEGA Take 3 mg by mouth at bedtime.   pantoprazole 40 MG tablet Commonly known as: PROTONIX TAKE 1 TABLET(40 MG) BY MOUTH DAILY What changed: See the new instructions.   potassium chloride SA 20 MEQ tablet Commonly known as: KLOR-CON Take 1 tablet (20 mEq total) by mouth 2 (two) times daily.   saccharomyces boulardii 250 MG capsule Commonly known as: FLORASTOR Take 1 capsule (250 mg total) by mouth 2 (two) times daily.   sitaGLIPtin 50 MG tablet Commonly known as: JANUVIA Take 50 mg by mouth daily.   sucralfate 1 g tablet Commonly known as: CARAFATE Take 1 g by mouth 2 (two) times daily.   Systane 0.4-0.3 % Soln Generic drug: Polyethyl Glycol-Propyl Glycol  Place 1 drop into both eyes daily as needed (for dry eyes).     torsemide 100 MG tablet Commonly known as: DEMADEX Take 100 mg by mouth daily.   Tyler Aas FlexTouch 100 UNIT/ML FlexTouch Pen Generic drug: insulin degludec Inject 14 Units into the skin daily.   vitamin B-12 1000 MCG tablet Commonly known as: CYANOCOBALAMIN Take 1,000 mcg by mouth daily.   vitamin C 500 MG tablet Commonly known as: ASCORBIC ACID Take 500 mg by mouth daily.         Major procedures and Radiology Reports - PLEASE review detailed and final reports thoroughly  -     DG Chest 2 View  Result Date: 02/20/2020 CLINICAL DATA:  Shortness of breath EXAM: CHEST - 2 VIEW COMPARISON:  February 06, 2019 FINDINGS: The heart size and mediastinal contours are stable. Mild diffuse increased pulmonary interstitium is identified bilaterally. There is no focal pneumonia or pleural effusion. Chronic linear scar is identified in the left mid to lung base. Bony structures are stable. IMPRESSION: Mild diffuse increased pulmonary interstitium bilaterally, mild interstitial edema is not excluded. Electronically Signed   By: Abelardo Diesel M.D.   On: 02/20/2020 09:37   US RENAL  Result Date: 02/22/2020 CLINICAL DATA:  Acute renal failure. EXAM: RENAL / URINARY TRACT ULTRASOUND COMPLETE COMPARISON:  None. FINDINGS: Right Kidney: Renal measurements: 9.1 x 4.6 x 4.2 cm = volume: 90 mL. There is increased renal parenchymal echogenicity. Slight lobulated renal contours. No hydronephrosis or evidence of mass. Left Kidney: Renal measurements: 10.0 x 4.1 x 4.4 cm = volume: 95 mL. Increased parenchymal echogenicity. No hydronephrosis or evidence of mass. Bladder: Not visualized, patient voided prior to the exam. Other: Technically limited exam due to habitus. IMPRESSION: 1. No obstructive uropathy. 2. Increased bilateral renal parenchymal echogenicity consistent with chronic medical renal disease. 3. Bladder not visualized or evaluated, patient voided prior to the exam. Electronically Signed   By: Keith Rake M.D.   On: 02/22/2020 18:19   ECHOCARDIOGRAM COMPLETE  Result Date: 02/21/2020    ECHOCARDIOGRAM REPORT   Patient Name:   KAMORA VOSSLER Date of Exam: 02/21/2020 Medical Rec #:  [id number]        Height:       59.0 in Accession #:    [id number]       Weight:       224.6 lb Date of Birth:  18-Dec-1945         BSA:          1.938 m Patient Age:    74 years         BP:           185/84 mmHg Patient Gender: F                HR:           72 bpm. Exam Location:  Inpatient Procedure: 2D Echo, Cardiac Doppler and Color Doppler Indications:    Congestive Heart Failure 428.0 / I50.9  History:        Patient has prior history of Echocardiogram examinations, most                 recent 11/14/2017. CHF; Risk Factors:Diabetes and Hypertension.  Sonographer:    Bernadene Person RDCS Referring Phys: [id number] Mckinley Jewel  Sonographer Comments: patient refused definity. IMPRESSIONS  1. Left ventricular ejection fraction, by estimation, is 60 to 65%. The left ventricle has normal function. The left ventricle has no regional  wall motion abnormalities. There is mild concentric left ventricular hypertrophy. Left ventricular diastolic parameters are consistent with Grade II diastolic dysfunction (pseudonormalization). Elevated left atrial pressure.  2. Right ventricular systolic function is normal. The right ventricular size is normal.  3. The mitral valve is degenerative. No evidence of mitral valve regurgitation. No evidence of mitral stenosis.  4. The aortic valve is tricuspid. There is mild calcification of the aortic valve. Aortic valve regurgitation is not visualized. Mild aortic valve sclerosis is present, with no evidence of aortic valve stenosis. Comparison(s): No significant change from prior study. FINDINGS  Left Ventricle: Left ventricular ejection fraction, by estimation, is 60 to 65%. The left ventricle has normal function. The left ventricle has no regional wall motion abnormalities. The left ventricular internal  cavity size was normal in size. There is  mild concentric left ventricular hypertrophy. Left ventricular diastolic parameters are consistent with Grade II diastolic dysfunction (pseudonormalization). Elevated left atrial pressure. Right Ventricle: The right ventricular size is normal. No increase in right ventricular wall thickness. Right ventricular systolic function is normal. Left Atrium: Left atrial size was normal in size. Right Atrium: Right atrial size was normal in size. Pericardium: Trivial pericardial effusion is present. Mitral Valve: The mitral valve is degenerative in appearance. Mild mitral annular calcification. No evidence of mitral valve regurgitation. No evidence of mitral valve stenosis. Tricuspid Valve: The tricuspid valve is grossly normal. Tricuspid valve regurgitation is trivial. No evidence of tricuspid stenosis. Aortic Valve: The aortic valve is tricuspid. There is mild calcification of the aortic valve. Aortic valve regurgitation is not visualized. Mild aortic valve sclerosis is present, with no evidence of aortic valve stenosis. Pulmonic Valve: The pulmonic valve was grossly normal. Pulmonic valve regurgitation is not visualized. No evidence of pulmonic stenosis. Aorta: The aortic root and ascending aorta are structurally normal, with no evidence of dilitation. Venous: The inferior vena cava was not well visualized. IAS/Shunts: The atrial septum is grossly normal.  LEFT VENTRICLE PLAX 2D LVIDd:         4.60 cm  Diastology LVIDs:         2.60 cm  LV e' medial:    4.80 cm/s LV PW:         1.20 cm  LV E/e' medial:  32.5 LV IVS:        1.00 cm  LV e' lateral:   5.33 cm/s LVOT diam:     2.10 cm  LV E/e' lateral: 29.3 LV SV:         102 LV SV Index:   53 LVOT Area:     3.46 cm  RIGHT VENTRICLE RV S prime:     14.60 cm/s TAPSE (M-mode): 2.3 cm LEFT ATRIUM             Index       RIGHT ATRIUM           Index LA diam:        3.80 cm 1.96 cm/m  RA Area:     13.20 cm LA Vol (A2C):   92.0 ml 47.48  ml/m RA Volume:   26.90 ml  13.88 ml/m LA Vol (A4C):   75.5 ml 38.96 ml/m LA Biplane Vol: 85.2 ml 43.97 ml/m  AORTIC VALVE LVOT Vmax:   119.00 cm/s LVOT Vmean:  83.200 cm/s LVOT VTI:    0.295 m  AORTA Ao Root diam: 3.30 cm Ao Asc diam:  3.10 cm MITRAL VALVE  TRICUSPID VALVE MV Area (PHT): 3.48 cm     TR Peak grad:   14.0 mmHg MV Decel Time: 218 msec     TR Vmax:        187.00 cm/s MV E velocity: 156.00 cm/s MV A velocity: 147.00 cm/s  SHUNTS MV E/A ratio:  1.06         Systemic VTI:  0.30 m                             Systemic Diam: 2.10 cm Eleonore Chiquito MD Electronically signed by Eleonore Chiquito MD Signature Date/Time: 02/21/2020/3:57:54 PM    Final     Micro Results   Recent Results (from the past 240 hour(s))  Respiratory Panel by RT PCR (Flu A&B, Covid) - Nasopharyngeal Swab     Status: None   Collection Time: 02/20/20 12:09 PM   Specimen: Nasopharyngeal Swab  Result Value Ref Range Status   SARS Coronavirus 2 by RT PCR NEGATIVE NEGATIVE Final    Comment: (NOTE) SARS-CoV-2 target nucleic acids are NOT DETECTED.  The SARS-CoV-2 RNA is generally detectable in upper respiratoy specimens during the acute phase of infection. The lowest concentration of SARS-CoV-2 viral copies this assay can detect is 131 copies/mL. A negative result does not preclude SARS-Cov-2 infection and should not be used as the sole basis for treatment or other patient management decisions. A negative result may occur with  improper specimen collection/handling, submission of specimen other than nasopharyngeal swab, presence of viral mutation(s) within the areas targeted by this assay, and inadequate number of viral copies (<131 copies/mL). A negative result must be combined with clinical observations, patient history, and epidemiological information. The expected result is Negative.  Fact Sheet for Patients:  PinkCheek.be  Fact Sheet for Healthcare Providers:    GravelBags.it  This test is no t yet approved or cleared by the [place] FDA and  has been authorized for detection and/or diagnosis of SARS-CoV-2 by FDA under an Emergency Use Authorization (EUA). This EUA will remain  in effect (meaning this test can be used) for the duration of the COVID-19 declaration under Section 564(b)(1) of the Act, 21 U.S.C. section 360bbb-3(b)(1), unless the authorization is terminated or revoked sooner.     Influenza A by PCR NEGATIVE NEGATIVE Final   Influenza B by PCR NEGATIVE NEGATIVE Final    Comment: (NOTE) The Xpert Xpress SARS-CoV-2/FLU/RSV assay is intended as an aid in  the diagnosis of influenza from Nasopharyngeal swab specimens and  should not be used as a sole basis for treatment. Nasal washings and  aspirates are unacceptable for Xpert Xpress SARS-CoV-2/FLU/RSV  testing.  Fact Sheet for Patients: PinkCheek.be  Fact Sheet for Healthcare Providers: GravelBags.it  This test is not yet approved or cleared by the [place] FDA and  has been authorized for detection and/or diagnosis of SARS-CoV-2 by  FDA under an Emergency Use Authorization (EUA). This EUA will remain  in effect (meaning this test can be used) for the duration of the  Covid-19 declaration under Section 564(b)(1) of the Act, 21  U.S.C. section 360bbb-3(b)(1), unless the authorization is  terminated or revoked. Performed at [facility], [place] [address] [place], [place] [address]   SARS Coronavirus 2 by RT PCR (hospital order, performed in [facility] hospital lab) Nasopharyngeal Nasopharyngeal Swab     Status: None   Collection Time: 02/25/20  2:47 PM   Specimen: Nasopharyngeal Swab  Result  Value Ref Range Status   SARS Coronavirus 2 NEGATIVE NEGATIVE Final    Comment: (NOTE) SARS-CoV-2 target nucleic acids are NOT DETECTED.  The SARS-CoV-2 RNA is generally detectable in  upper and lower respiratory specimens during the acute phase of infection. The lowest concentration of SARS-CoV-2 viral copies this assay can detect is 250 copies / mL. A negative result does not preclude SARS-CoV-2 infection and should not be used as the sole basis for treatment or other patient management decisions.  A negative result may occur with improper specimen collection / handling, submission of specimen other than nasopharyngeal swab, presence of viral mutation(s) within the areas targeted by this assay, and inadequate number of viral copies (<250 copies / mL). A negative result must be combined with clinical observations, patient history, and epidemiological information.  Fact Sheet for Patients:   StrictlyIdeas.no  Fact Sheet for Healthcare Providers: BankingDealers.co.za  This test is not yet approved or  cleared by the [place] FDA and has been authorized for detection and/or diagnosis of SARS-CoV-2 by FDA under an Emergency Use Authorization (EUA).  This EUA will remain in effect (meaning this test can be used) for the duration of the COVID-19 declaration under Section 564(b)(1) of the Act, 21 U.S.C. section 360bbb-3(b)(1), unless the authorization is terminated or revoked sooner.  Performed at [facility], [place] [address], [place], [place] [address]     Today   Subjective    Trinda Harlacher feels much improved since admission.  Feels ready to go home.  Denies chest pain, shortness of breath or abdominal pain.  Feels she can take care of herself with the resources he has at home.  Objective   Blood pressure (!) 162/84, pulse 83, temperature 97.8 F (36.6 C), temperature source Oral, resp. rate (!) 22, height 4\' 11"  (1.499 m), weight 100.6 kg, SpO2 96 %.   Intake/Output Summary (Last 24 hours) at 02/26/2020 1222 Last data filed at 02/26/2020 1103 Gross per 24 hour  Intake 980 ml  Output 922 ml  Net  58 ml    Exam General: Patient appears well and in good spirits sitting up in recliner in no acute distress.  Eyes: sclera anicteric, conjuctiva mild injection bilaterally CVS: S1-S2, regular  Respiratory:   Decreased air entry bilaterally with rare squeak.  No wheezes. GI: NABS, soft, NT  LE:  1+ edema bilaterally Neuro: A/O x 3, Moving all extremities equally with normal strength, CN 3-12 intact, grossly nonfocal.  Psych: patient is logical and coherent, judgement and insight appear normal, mood and affect appropriate to situation.    Data Review   CBC w Diff:  Lab Results  Component Value Date   WBC 11.4 (H) 02/21/2020   HGB 10.2 (L) 02/21/2020   HCT 31.9 (L) 02/21/2020   PLT 315 02/21/2020   LYMPHOPCT 12 02/19/2019   MONOPCT 7 02/19/2019   EOSPCT 2 02/19/2019   BASOPCT 0 02/19/2019    CMP:  Lab Results  Component Value Date   NA 135 02/26/2020   NA 135 (A) 11/29/2019   K 4.6 02/26/2020   CL 98 02/26/2020   CO2 28 02/26/2020   BUN 65 (H) 02/26/2020   BUN 45 (A) 11/29/2019   CREATININE 2.64 (H) 02/26/2020   GLU 128 11/29/2019   PROT 5.4 (L) 02/26/2020   ALBUMIN 2.6 (L) 02/26/2020   BILITOT 0.8 02/26/2020   ALKPHOS 54 02/26/2020   AST 22 02/26/2020   ALT 20 02/26/2020  .   Total Time  in preparing paper work, data evaluation and todays exam - 35 minutes  Vashti Hey M.D on 02/26/2020 at 12:22 PM  [facility]   Office  [phone]

## 2020-02-26 NOTE — Progress Notes (Signed)
Discharge instructions (including medications) discussed with and copy provided to patient/caregiver. All belongings sent with patient. 

## 2020-02-26 NOTE — TOC Progression Note (Signed)
Transition of Care [facility]) - Progression Note    Patient Details  Name: Amanda Cain MRN: [id number] Date of Birth: 03/19/1946  Transition of Care [facility]) CM/SW [place], [place] Phone Number: 02/26/2020, 9:44 AM  Clinical Narrative:     Patrick Jupiter confirmed they can admit patient today, if medically stable for discharge. Covid test completed yesterday.  Thurmond Butts, MSW, LCSWA Clinical Social Worker     Barriers to Discharge: Continued Medical Work up, Orthoptist and Services   In-house Referral: Clinical Social Work     Living arrangements for the past 2 months: Single Family Home                                       Social Determinants of Health (SDOH) Interventions    Readmission Risk Interventions No flowsheet data found.

## 2020-02-26 NOTE — TOC Progression Note (Signed)
Transition of Care [facility]) - Progression Note    Patient Details  Name: Amanda Cain MRN: [id number] Date of Birth: 29-May-1946  Transition of Care [facility]) CM/SW [place], [place] Phone Number: 02/26/2020, 11:36 AM  Clinical Narrative:     CSW met with patient to confirm discharge plan. Patient has decided to discharge home. She states one of her sisters will provide transportation. She states she has [employer] for [facility] and they also provides an Engineer, production.  MD,RNCM & SNF updated  Thurmond Butts, MSW, LCSWA Clinical Social Worker     Barriers to Discharge: Continued Medical Work up, Orthoptist and Services   In-house Referral: Clinical Social Work     Living arrangements for the past 2 months: Single Family Home                                       Social Determinants of Health (SDOH) Interventions    Readmission Risk Interventions No flowsheet data found.

## 2020-02-26 NOTE — TOC Transition Note (Addendum)
Transition of Care (TOC) - CM/SW Discharge Note Marvetta Gibbons RN, BSN Transitions of Care Unit 4E- RN Case Manager See Treatment Team for direct phone #    Patient Details  Name: Amanda Cain MRN: [id number] Date of Birth: 1945/09/12  Transition of Care [facility]) CM/SW Contact:  Dawayne Patricia, RN Phone Number: 02/26/2020, 12:47 PM   Clinical Narrative:    Pt stable for transition today- was being worked up to go to SNF- however today pt has decided she would prefer to return home with [facility] services.  Pt has [facility] aide that comes 4hr/day, 6days/wk. Orders have been placed for HHRN/PT/aide, per pt she would like to use [facility] for [facility] needs. No DME needs Call made to [facility] with Alvis Lemmings for [facility] referral- Tommi Rumps to check to see if pt currently active- referral pending 1400- received notice from [facility]- unable to accept referral- call made to pt as she already left for home- msg left for her to return call regarding [place] 1430- patient returned call and was informed that Alvis Lemmings could not accept [facility] Referral- choice again offered- pt states she does not have any further preference for [facility]- and defers to [facility] to find an agency that can provide services- call made to [facility]- unable to accept due to staffing, Call made to [facility] with [facility]- who is able to accept with a start of care for either Oct 1 or Oct 2- call made back to patient who is agreeable - info provided to pt on [facility] including contact # for [place] another call from patient concerned about her aide services and she was asking if it was too late to go to SNF- informed pt that yes we could not place her now to [facility] as she had already gone home. Pt seems to think that her PCS services have been canceled.  Call made to [facility] services branch- and spoke with Marita [place]- she states that they were unable to get aide there this evening due to the hour that pt discharge home- however pt does still have her aide services and that her  aide will be there at [place] till 1pm tomorrow to assist her. She also states pt is eligible for more hours and that CAPs worker is working on this. Call made back to pt to inform her of this information- pt is relieved to here this and also informs this writer that her sister is making a meal for her this evening. So she feels better about things now.   Final next level of care: [place] Barriers to Discharge: Barriers Resolved   Patient Goals and CMS Choice Patient states their goals for this hospitalization and ongoing recovery are:: Get better soon CMS Medicare.gov Compare Post Acute Care list provided to:: Patient Choice offered to / list presented to : Patient  Discharge Placement               Home with [facility]        Discharge Plan and Services In-house Referral: Clinical Social Work Discharge Planning Services: CM Consult Post Acute Care Choice: Home Health          DME Arranged: N/A         HH Arranged: RN, PT, Nurse's Aide HH Agency: [place] Date [place]: 02/26/20 Time [place]: 1227 Representative spoke with at [place]: [place] ([place]) Interventions     Readmission Risk Interventions Readmission Risk  Prevention Plan 02/26/2020  Transportation Screening Complete  Medication Review Press photographer) Complete  PCP or Specialist appointment within 3-5 days of discharge Complete  SW Recovery Care/Counseling Consult Complete  [place] Patient Refused  Some recent data might be hidden

## 2020-02-26 NOTE — Progress Notes (Signed)
Occupational Therapy Treatment Patient Details Name: Amanda Cain MRN: [id number] DOB: 27-Nov-1945 Today's Date: 02/26/2020    History of present illness 74 yo admitted with SOB, edema, CHF. PMHx:chronic hypoxemic respiratory failure-on 2 L PRN, HTN, HLD, DM, depression/anxiety, CKD stage IV, anemia of chronic disease, GERD, hypothyroidism, obesity   OT comments  Patient with improved O2 sats on RA and increased independence noted with toileting, functional mobility and stand grooming task.  O2 sats monitored throughout.  Patient did de-sat once to 88%, but quickly re-bounded to 94% with pursed lip breathing and rest break.  Patient was on room air the entire session with average O2 sat 91%.  Patient with good sense of when to take therapeutic rest breaks.  Patient has a PCA prior level for meals, home management and community mobility 4 hours/day.  Generally she is SBA for showers and occasionally uses a RW in the home.  Patient prefers home with PCA and HH OT/PT.  Based on today's treatment, that is a distinct possibility.  OT sent a message to CM for review of options given medical status.     Follow Up Recommendations  Home health OT , PT , SN and prior PCA if MD is ok with that versus SNF.     Equipment Recommendations  None recommended by OT;Other (comment) (has needed DME)    Recommendations for Other Services      Precautions / Restrictions Precautions Precautions: Fall Precaution Comments: watch SpO2 Restrictions Weight Bearing Restrictions: No          Balance Overall balance assessment: Modified Independent Sitting-balance support: Feet supported Sitting balance-Leahy Scale: Good     Standing balance support: Bilateral upper extremity supported Standing balance-Leahy Scale: Good Standing balance comment: use of RW for energy conservation and stability.  Allows her to rest.                           ADL either performed or assessed with clinical  judgement   ADL       Grooming: Wash/dry hands;Modified independent;Standing               Lower Body Dressing: Minimal assistance;Sit to/from stand;Sitting/lateral leans   Toilet Transfer: Modified Independent Toilet Transfer Details (indicate cue type and reason): no O2 and slowed pace with short steps. Toileting- Clothing Manipulation and Hygiene: Independent       Functional mobility during ADLs: Modified independent;Rolling walker General ADL Comments: will stop for therapeutic rest breaks.  O2 sats monitored.          Prior Functioning/Environment              Frequency  Min 2X/week        Progress Toward Goals  OT Goals(current goals can now be found in the care plan section)  Progress towards OT goals: Progressing toward goals  Acute Rehab OT Goals Patient Stated Goal: return home with [facility] if possible OT Goal Formulation: With patient Time For Goal Achievement: 03/06/20 Potential to Achieve Goals: Good  Plan Discharge plan needs to be updated    Co-evaluation                 AM-PAC OT "6 Clicks" Daily Activity     Outcome Measure   Help from another person eating meals?: None Help from another person taking care of personal grooming?: None Help from another person toileting, which includes using toliet, bedpan, or urinal?: None Help from another person bathing (  including washing, rinsing, drying)?: A Little Help from another person to put on and taking off regular upper body clothing?: A Little Help from another person to put on and taking off regular lower body clothing?: A Little 6 Click Score: 21    End of Session Equipment Utilized During Treatment: Rolling walker  OT Visit Diagnosis: Other abnormalities of gait and mobility (R26.89);Muscle weakness (generalized) (M62.81)   Activity Tolerance Patient tolerated treatment well   Patient Left in chair;with call bell/phone within reach   Nurse Communication          Time:  3300-7622 OT Time Calculation (min): 24 min  Charges: OT General Charges $OT Visit: 1 Visit OT Treatments $Self Care/Home Management : 23-37 mins  02/26/2020  Rich, OTR/L  Acute Rehabilitation Services  Office:  [phone]   Metta Clines 02/26/2020, 10:28 AM

## 2020-03-05 ENCOUNTER — Telehealth: Payer: Self-pay | Admitting: Adult Health

## 2020-03-05 DIAGNOSIS — J4540 Moderate persistent asthma, uncomplicated: Secondary | ICD-10-CM

## 2020-03-05 NOTE — Telephone Encounter (Signed)
Message has been sent to [employer] to figure out what the delay is they received the order and confirmed that they did

## 2020-03-05 NOTE — Telephone Encounter (Signed)
Recommendations last time for further evaluation of O2 needs. She declined and was adamant about d/c O2  o2 can be d/c per her request

## 2020-03-05 NOTE — Telephone Encounter (Addendum)
Called and spoke to pt. Pt states she did want to keep her O2 after discharge but now feels she doesn't need it. Pt d/c on 9/29, discharge notes say pt was weaned to room air and had O2 prn which was baseline. Pt states she has a pulse/ox at home and she states her spo2 has been in the mid-high 90s lately.   Order placed for for d/c of O2. Will forward to TP as FYI.

## 2020-03-05 NOTE — Telephone Encounter (Signed)
D/C order has been placed.  Nothing further needed at this time- will close encounter.

## 2020-03-05 NOTE — Telephone Encounter (Signed)
Spoke to [place] she shows that they called the patient to set up pick up then pt went in the hosp and they called to resc and patient wanted to keep it so they closed the order nurse may need to call the patient and clarify

## 2020-03-05 NOTE — Telephone Encounter (Signed)
Spoke with pt. She is requesting for [place] to send an order to [facility] to have her oxygen picked. States that she is no longer using it.  Tammy - please advise. Thanks.

## 2020-03-05 NOTE — Telephone Encounter (Signed)
Last note said to d/c O2 and referral was placed ? Was it not done?

## 2020-03-05 NOTE — Telephone Encounter (Signed)
Order was placed 02/13/20 for pt's O2 to be discontinued. Routing to [facility] to follow up on this.

## 2020-03-06 ENCOUNTER — Telehealth: Payer: Self-pay | Admitting: Interventional Cardiology

## 2020-03-06 NOTE — Telephone Encounter (Signed)
Pt c/o swelling: STAT is pt has developed SOB within 24 hours  1) How much weight have you gained and in what time span? 3 lbs since 03/02/20  2) If swelling, where is the swelling located? Both legs and feet  3) Are you currently taking a fluid pill? Yes  4) Are you currently SOB? No  5) Do you have a log of your daily weights (if so, list)? Yes  6) Have you gained 3 pounds in a day or 5 pounds in a week? No  7) Have you traveled recently? No  Pt wants to know if Dr. Irish Lack wants her to take another half of torsemide (DEMADEX) 100 MG tablet with the one she takes daily.

## 2020-03-06 NOTE — Telephone Encounter (Signed)
I have tried to reach the patient multiple times but the phone keeps ringing busy. Will try again later.

## 2020-03-09 NOTE — Telephone Encounter (Signed)
Called and spoke to the patient. She states that her weight is down today and her swelling has improved. She denies any complaints at this time. Reiterated importance of avoiding salt in her diet and leg elevation. She verbalized understanding and thanked me for the call.

## 2020-03-11 ENCOUNTER — Ambulatory Visit: Payer: Medicare Other | Admitting: Interventional Cardiology
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
## 2020-03-16 ENCOUNTER — Other Ambulatory Visit: Payer: Self-pay

## 2020-03-16 ENCOUNTER — Ambulatory Visit (INDEPENDENT_AMBULATORY_CARE_PROVIDER_SITE_OTHER): Payer: Medicare Other | Admitting: Interventional Cardiology

## 2020-03-16 ENCOUNTER — Encounter: Payer: Self-pay | Admitting: Interventional Cardiology

## 2020-03-16 VITALS — BP 116/78 | HR 61 | Ht 59.0 in | Wt 222.0 lb

## 2020-03-16 DIAGNOSIS — N184 Chronic kidney disease, stage 4 (severe): Secondary | ICD-10-CM

## 2020-03-16 DIAGNOSIS — I5032 Chronic diastolic (congestive) heart failure: Secondary | ICD-10-CM

## 2020-03-16 DIAGNOSIS — I110 Hypertensive heart disease with heart failure: Secondary | ICD-10-CM | POA: Diagnosis not present

## 2020-03-16 DIAGNOSIS — I10 Essential (primary) hypertension: Secondary | ICD-10-CM

## 2020-03-16 LAB — BASIC METABOLIC PANEL
BUN/Creatinine Ratio: 21 (ref 12–28)
BUN: 62 mg/dL — ABNORMAL HIGH (ref 8–27)
CO2: 24 mmol/L (ref 20–29)
Calcium: 9.5 mg/dL (ref 8.7–10.3)
Chloride: 97 mmol/L (ref 96–106)
Creatinine, Ser: 2.89 mg/dL — ABNORMAL HIGH (ref 0.57–1.00)
GFR calc Af Amer: 18 mL/min/{1.73_m2} — ABNORMAL LOW (ref >59)
GFR calc non Af Amer: 15 mL/min/{1.73_m2} — ABNORMAL LOW (ref >59)
Glucose: 117 mg/dL — ABNORMAL HIGH (ref 65–99)
Potassium: 4.9 mmol/L (ref 3.5–5.2)
Sodium: 133 mmol/L — ABNORMAL LOW (ref 134–144)

## 2020-03-16 NOTE — Patient Instructions (Signed)
Medication Instructions:  Your physician recommends that you continue on your current medications as directed. Please refer to the Current Medication list given to you today.  *If you need a refill on your cardiac medications before your next appointment, please call your pharmacy*   Lab Work: TODAY: BMET  If you have labs (blood work) drawn today and your tests are completely normal, you will receive your results only by: . MyChart Message (if you have MyChart) OR . A paper copy in the mail If you have any lab test that is abnormal or we need to change your treatment, we will call you to review the results.   Testing/Procedures: None   Follow-Up: At [facility], you and your health needs are our priority.  As part of our continuing mission to provide you with exceptional heart care, we have created designated Provider Care Teams.  These Care Teams include your primary Cardiologist (physician) and Advanced Practice Providers (APPs -  Physician Assistants and Nurse Practitioners) who all work together to provide you with the care you need, when you need it.  We recommend signing up for the patient portal called "MyChart".  Sign up information is provided on this After Visit Summary.  MyChart is used to connect with patients for Virtual Visits (Telemedicine).  Patients are able to view lab/test results, encounter notes, upcoming appointments, etc.  Non-urgent messages can be sent to your provider as well.   To learn more about what you can do with MyChart, go to [email or web address].    Your next appointment:   6 month(s)  The format for your next appointment:   In Person  Provider:   You may see Jayadeep Varanasi, MD or one of the following Advanced Practice Providers on your designated Care Team:    Dayna Dunn, PA-C  Michele Lenze, PA-C    Other Instructions None  

## 2020-03-17 ENCOUNTER — Ambulatory Visit (INDEPENDENT_AMBULATORY_CARE_PROVIDER_SITE_OTHER): Payer: Medicare Other | Admitting: Endocrinology

## 2020-03-17 ENCOUNTER — Encounter: Payer: Self-pay | Admitting: Endocrinology

## 2020-03-17 VITALS — BP 124/60 | HR 57 | Ht 59.0 in | Wt 223.0 lb

## 2020-03-17 DIAGNOSIS — E118 Type 2 diabetes mellitus with unspecified complications: Secondary | ICD-10-CM

## 2020-03-17 DIAGNOSIS — Z794 Long term (current) use of insulin: Secondary | ICD-10-CM | POA: Diagnosis not present

## 2020-03-17 DIAGNOSIS — N184 Chronic kidney disease, stage 4 (severe): Secondary | ICD-10-CM

## 2020-03-17 DIAGNOSIS — E1122 Type 2 diabetes mellitus with diabetic chronic kidney disease: Secondary | ICD-10-CM | POA: Diagnosis not present

## 2020-03-17 MED ORDER — TRESIBA FLEXTOUCH 100 UNIT/ML ~~LOC~~ SOPN
20.0000 [IU] | PEN_INJECTOR | Freq: Every day | SUBCUTANEOUS | 11 refills | Status: DC
Start: 2020-03-17 — End: 2020-05-15
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
## 2020-03-17 NOTE — Patient Instructions (Addendum)
good diet and exercise significantly improve the control of your diabetes.  please let me know if you wish to be referred to a dietician.  high blood sugar is very risky to your health.  you should see an eye doctor and dentist every year.  It is very important to get all recommended vaccinations.  Controlling your blood pressure and cholesterol drastically reduces the damage diabetes does to your body.  Those who smoke should quit.  Please discuss these with your doctor.  check your blood sugar twice a day.  vary the time of day when you check, between before the 3 meals, and at bedtime.  also check if you have symptoms of your blood sugar being too high or too low.  please keep a record of the readings and bring it to your next appointment here (or you can bring the meter itself).  You can write it on any piece of paper.  please call us sooner if your blood sugar goes below 70, or if you have a lot of readings over 200. We will need to take this complex situation in stages For now, please: Stop taking the Januvia, and: Increase the tresiba to 20 units daily, and:  Please continue the same glimepiride.   Please come back for a follow-up appointment in 6 weeks.

## 2020-03-23 ENCOUNTER — Telehealth: Payer: Self-pay | Admitting: Interventional Cardiology

## 2020-03-23 NOTE — Telephone Encounter (Signed)
Pt c/o swelling: STAT is pt has developed SOB within 24 hours  1) How much weight have you gained and in what time span? 7 pounds in one week  2) If swelling, where is the swelling located? Feet and legs   3) Are you currently taking a fluid pill? yes  4) Are you currently SOB? Yes   5) Do you have a log of your daily weights (if so, list)?   03/23/20: 223  03/16/20: 216  6) Have you gained 3 pounds in a day or 5 pounds in a week?   7) Have you traveled recently? no

## 2020-03-23 NOTE — Telephone Encounter (Signed)
Patient is calling in complaining of bilateral lower extremity swelling. She reports mild abdominal swelling. Patient denies any additional salt intake. She has gained 7 pounds in the past week.  03/16/20: 216 lb 03/23/20: 223 lb  Patient has mild SOB which she states is normal for her. She was seen in office by Dr. Scarlette Calico on 03/16/20.  Dr. Scarlette Calico recommendation to increase Torsemide to 1.5 tablets (150 mg total) for two days and then return to 100 mg one tablet daily. Patient made aware and verbalized understanding. Encouraged the patient to continue keeping daily record of weight and keep feet elevated, avoid any salt intake.

## 2020-03-25 ENCOUNTER — Ambulatory Visit: Payer: Medicare Other | Admitting: Podiatry

## 2020-03-31 ENCOUNTER — Encounter: Payer: Self-pay | Admitting: Podiatry

## 2020-03-31 ENCOUNTER — Other Ambulatory Visit: Payer: Self-pay

## 2020-03-31 ENCOUNTER — Ambulatory Visit (INDEPENDENT_AMBULATORY_CARE_PROVIDER_SITE_OTHER): Payer: Medicare Other | Admitting: Podiatry

## 2020-03-31 DIAGNOSIS — M79675 Pain in left toe(s): Secondary | ICD-10-CM | POA: Diagnosis not present

## 2020-03-31 DIAGNOSIS — M79674 Pain in right toe(s): Secondary | ICD-10-CM

## 2020-03-31 DIAGNOSIS — N183 Chronic kidney disease, stage 3 unspecified: Secondary | ICD-10-CM

## 2020-03-31 DIAGNOSIS — E118 Type 2 diabetes mellitus with unspecified complications: Secondary | ICD-10-CM | POA: Diagnosis not present

## 2020-03-31 DIAGNOSIS — B351 Tinea unguium: Secondary | ICD-10-CM

## 2020-03-31 NOTE — Progress Notes (Signed)
This patient returns to my office for at risk foot care.  This patient requires this care by a professional since this patient will be at risk due to having diabetes and chronic kidney disease.  Patient has not been seen in over 6 months.  This patient is unable to cut nails himself since the patient cannot reach his nails.These nails are painful walking and wearing shoes.  This patient presents for at risk foot care today.  General Appearance  Alert, conversant and in no acute stress.  Vascular  Dorsalis pedis and posterior tibial  pulses are weakly  palpable  bilaterally.  Capillary return is within normal limits  bilaterally. Temperature is within normal limits  bilaterally.  Neurologic  Senn-Weinstein monofilament wire test within normal limits  bilaterally. Muscle power within normal limits bilaterally.  Nails Thick disfigured discolored nails with subungual debris  from hallux to fifth toes bilaterally. No evidence of bacterial infection or drainage bilaterally.  Orthopedic  No limitations of motion  feet .  No crepitus or effusions noted.  No bony pathology or digital deformities noted.  HAV  .  Pes planus.  Skin  normotropic skin with no porokeratosis noted bilaterally.  No signs of infections or ulcers noted.     Onychomycosis  Pain in right toes  Pain in left toes  Consent was obtained for treatment procedures.   Mechanical debridement of nails 1-5  bilaterally performed with a nail nipper.  Filed with dremel without incident.    Return office visit   3 months                  Told patient to return for periodic foot care and evaluation due to potential at risk complications.   Gardiner Barefoot DPM

## 2020-04-16 ENCOUNTER — Other Ambulatory Visit: Payer: Self-pay

## 2020-04-16 ENCOUNTER — Emergency Department (HOSPITAL_COMMUNITY): Payer: Medicare Other

## 2020-04-16 ENCOUNTER — Inpatient Hospital Stay (HOSPITAL_COMMUNITY)
Admission: EM | Admit: 2020-04-16 | Discharge: 2020-04-21 | DRG: 291 | Disposition: A | Discharge status: Skilled Nursing Facility | Payer: Medicare Other | Attending: Internal Medicine | Admitting: Internal Medicine

## 2020-04-16 ENCOUNTER — Encounter (HOSPITAL_COMMUNITY): Payer: Self-pay

## 2020-04-16 DIAGNOSIS — F419 Anxiety disorder, unspecified: Secondary | ICD-10-CM | POA: Diagnosis present

## 2020-04-16 DIAGNOSIS — Z6841 Body Mass Index (BMI) 40.0 and over, adult: Secondary | ICD-10-CM

## 2020-04-16 DIAGNOSIS — Z79890 Hormone replacement therapy (postmenopausal): Secondary | ICD-10-CM

## 2020-04-16 DIAGNOSIS — Z820 Family history of epilepsy and other diseases of the nervous system: Secondary | ICD-10-CM

## 2020-04-16 DIAGNOSIS — Z79899 Other long term (current) drug therapy: Secondary | ICD-10-CM

## 2020-04-16 DIAGNOSIS — N189 Chronic kidney disease, unspecified: Secondary | ICD-10-CM | POA: Diagnosis not present

## 2020-04-16 DIAGNOSIS — Z886 Allergy status to analgesic agent status: Secondary | ICD-10-CM

## 2020-04-16 DIAGNOSIS — N179 Acute kidney failure, unspecified: Secondary | ICD-10-CM | POA: Diagnosis present

## 2020-04-16 DIAGNOSIS — I509 Heart failure, unspecified: Secondary | ICD-10-CM

## 2020-04-16 DIAGNOSIS — I5033 Acute on chronic diastolic (congestive) heart failure: Secondary | ICD-10-CM | POA: Diagnosis present

## 2020-04-16 DIAGNOSIS — E119 Type 2 diabetes mellitus without complications: Secondary | ICD-10-CM

## 2020-04-16 DIAGNOSIS — Z96651 Presence of right artificial knee joint: Secondary | ICD-10-CM | POA: Diagnosis present

## 2020-04-16 DIAGNOSIS — Z20822 Contact with and (suspected) exposure to covid-19: Secondary | ICD-10-CM | POA: Diagnosis present

## 2020-04-16 DIAGNOSIS — J45901 Unspecified asthma with (acute) exacerbation: Secondary | ICD-10-CM | POA: Diagnosis present

## 2020-04-16 DIAGNOSIS — I130 Hypertensive heart and chronic kidney disease with heart failure and stage 1 through stage 4 chronic kidney disease, or unspecified chronic kidney disease: Principal | ICD-10-CM | POA: Diagnosis present

## 2020-04-16 DIAGNOSIS — F32A Depression, unspecified: Secondary | ICD-10-CM | POA: Diagnosis present

## 2020-04-16 DIAGNOSIS — Z888 Allergy status to other drugs, medicaments and biological substances status: Secondary | ICD-10-CM

## 2020-04-16 DIAGNOSIS — Z8041 Family history of malignant neoplasm of ovary: Secondary | ICD-10-CM

## 2020-04-16 DIAGNOSIS — J45909 Unspecified asthma, uncomplicated: Secondary | ICD-10-CM | POA: Diagnosis present

## 2020-04-16 DIAGNOSIS — Z8042 Family history of malignant neoplasm of prostate: Secondary | ICD-10-CM

## 2020-04-16 DIAGNOSIS — E785 Hyperlipidemia, unspecified: Secondary | ICD-10-CM | POA: Diagnosis present

## 2020-04-16 DIAGNOSIS — N184 Chronic kidney disease, stage 4 (severe): Secondary | ICD-10-CM | POA: Diagnosis present

## 2020-04-16 DIAGNOSIS — E669 Obesity, unspecified: Secondary | ICD-10-CM | POA: Diagnosis present

## 2020-04-16 DIAGNOSIS — Z7951 Long term (current) use of inhaled steroids: Secondary | ICD-10-CM

## 2020-04-16 DIAGNOSIS — E7800 Pure hypercholesterolemia, unspecified: Secondary | ICD-10-CM | POA: Diagnosis present

## 2020-04-16 DIAGNOSIS — R5381 Other malaise: Secondary | ICD-10-CM | POA: Diagnosis present

## 2020-04-16 DIAGNOSIS — I248 Other forms of acute ischemic heart disease: Secondary | ICD-10-CM | POA: Diagnosis present

## 2020-04-16 DIAGNOSIS — I272 Pulmonary hypertension, unspecified: Secondary | ICD-10-CM | POA: Diagnosis present

## 2020-04-16 DIAGNOSIS — M1990 Unspecified osteoarthritis, unspecified site: Secondary | ICD-10-CM | POA: Diagnosis present

## 2020-04-16 DIAGNOSIS — E039 Hypothyroidism, unspecified: Secondary | ICD-10-CM | POA: Diagnosis present

## 2020-04-16 DIAGNOSIS — Z8249 Family history of ischemic heart disease and other diseases of the circulatory system: Secondary | ICD-10-CM

## 2020-04-16 DIAGNOSIS — I10 Essential (primary) hypertension: Secondary | ICD-10-CM | POA: Diagnosis present

## 2020-04-16 DIAGNOSIS — E1122 Type 2 diabetes mellitus with diabetic chronic kidney disease: Secondary | ICD-10-CM | POA: Diagnosis present

## 2020-04-16 DIAGNOSIS — E876 Hypokalemia: Secondary | ICD-10-CM | POA: Diagnosis present

## 2020-04-16 DIAGNOSIS — Z833 Family history of diabetes mellitus: Secondary | ICD-10-CM

## 2020-04-16 DIAGNOSIS — K219 Gastro-esophageal reflux disease without esophagitis: Secondary | ICD-10-CM | POA: Diagnosis present

## 2020-04-16 DIAGNOSIS — Z823 Family history of stroke: Secondary | ICD-10-CM

## 2020-04-16 LAB — COMPREHENSIVE METABOLIC PANEL
ALT: 28 U/L (ref 0–44)
AST: 22 U/L (ref 15–41)
Albumin: 3.2 g/dL — ABNORMAL LOW (ref 3.5–5.0)
Alkaline Phosphatase: 65 U/L (ref 38–126)
Anion gap: 11 (ref 5–15)
BUN: 64 mg/dL — ABNORMAL HIGH (ref 8–23)
CO2: 29 mmol/L (ref 22–32)
Calcium: 9.5 mg/dL (ref 8.9–10.3)
Chloride: 94 mmol/L — ABNORMAL LOW (ref 98–111)
Creatinine, Ser: 3.01 mg/dL — ABNORMAL HIGH (ref 0.44–1.00)
GFR, Estimated: 16 mL/min — ABNORMAL LOW (ref >60)
Glucose, Bld: 128 mg/dL — ABNORMAL HIGH (ref 70–99)
Potassium: 3.9 mmol/L (ref 3.5–5.1)
Sodium: 134 mmol/L — ABNORMAL LOW (ref 135–145)
Total Bilirubin: 0.6 mg/dL (ref 0.3–1.2)
Total Protein: 6.3 g/dL — ABNORMAL LOW (ref 6.5–8.1)

## 2020-04-16 LAB — CBC WITH DIFFERENTIAL/PLATELET
Abs Immature Granulocytes: 0.07 10*3/uL (ref 0.00–0.07)
Basophils Absolute: 0 10*3/uL (ref 0.0–0.1)
Basophils Relative: 0 %
Eosinophils Absolute: 0.3 10*3/uL (ref 0.0–0.5)
Eosinophils Relative: 3 %
HCT: 37.2 % (ref 36.0–46.0)
Hemoglobin: 11.5 g/dL — ABNORMAL LOW (ref 12.0–15.0)
Immature Granulocytes: 1 %
Lymphocytes Relative: 8 %
Lymphs Abs: 0.8 10*3/uL (ref 0.7–4.0)
MCH: 27.4 pg (ref 26.0–34.0)
MCHC: 30.9 g/dL (ref 30.0–36.0)
MCV: 88.6 fL (ref 80.0–100.0)
Monocytes Absolute: 0.8 10*3/uL (ref 0.1–1.0)
Monocytes Relative: 7 %
Neutro Abs: 8.7 10*3/uL — ABNORMAL HIGH (ref 1.7–7.7)
Neutrophils Relative %: 81 %
Platelets: 300 10*3/uL (ref 150–400)
RBC: 4.2 MIL/uL (ref 3.87–5.11)
RDW: 16.3 % — ABNORMAL HIGH (ref 11.5–15.5)
WBC: 10.7 10*3/uL — ABNORMAL HIGH (ref 4.0–10.5)
nRBC: 0 % (ref 0.0–0.2)

## 2020-04-16 LAB — URINALYSIS, ROUTINE W REFLEX MICROSCOPIC
Bilirubin Urine: NEGATIVE
Glucose, UA: NEGATIVE mg/dL
Hgb urine dipstick: NEGATIVE
Ketones, ur: NEGATIVE mg/dL
Nitrite: NEGATIVE
Protein, ur: 100 mg/dL — AB
Specific Gravity, Urine: 1.01 (ref 1.005–1.030)
pH: 6 (ref 5.0–8.0)

## 2020-04-16 LAB — RESPIRATORY PANEL BY RT PCR (FLU A&B, COVID)
Influenza A by PCR: NEGATIVE
Influenza B by PCR: NEGATIVE
SARS Coronavirus 2 by RT PCR: NEGATIVE

## 2020-04-16 LAB — TROPONIN I (HIGH SENSITIVITY)
Troponin I (High Sensitivity): 21 ng/L — ABNORMAL HIGH (ref <18)
Troponin I (High Sensitivity): 21 ng/L — ABNORMAL HIGH (ref <18)

## 2020-04-16 LAB — BRAIN NATRIURETIC PEPTIDE: B Natriuretic Peptide: 168.9 pg/mL — ABNORMAL HIGH (ref 0.0–100.0)

## 2020-04-16 MED ORDER — POLYVINYL ALCOHOL 1.4 % OP SOLN
1.0000 [drp] | Freq: Every day | OPHTHALMIC | Status: DC | PRN
  Administered 2020-04-19 – 2020-04-20 (×2): 1 [drp] via OPHTHALMIC
  Filled 2020-04-16 (×2): qty 15

## 2020-04-16 MED ORDER — SUCRALFATE 1 G PO TABS
1.0000 g | ORAL_TABLET | Freq: Two times a day (BID) | ORAL | Status: DC
  Administered 2020-04-17 – 2020-04-21 (×9): 1 g via ORAL
  Filled 2020-04-16 (×9): qty 1

## 2020-04-16 MED ORDER — FLUTICASONE FUROATE-VILANTEROL 200-25 MCG/INH IN AEPB
1.0000 | INHALATION_SPRAY | Freq: Every day | RESPIRATORY_TRACT | Status: DC
  Administered 2020-04-18 – 2020-04-21 (×4): 1 via RESPIRATORY_TRACT
  Filled 2020-04-16: qty 28

## 2020-04-16 MED ORDER — ESTRADIOL 1 MG PO TABS
0.5000 mg | ORAL_TABLET | Freq: Every morning | ORAL | Status: DC
  Administered 2020-04-17 – 2020-04-21 (×5): 0.5 mg via ORAL
  Filled 2020-04-16 (×5): qty 0.5

## 2020-04-16 MED ORDER — EZETIMIBE 10 MG PO TABS
10.0000 mg | ORAL_TABLET | Freq: Every day | ORAL | Status: DC
  Administered 2020-04-17 – 2020-04-21 (×5): 10 mg via ORAL
  Filled 2020-04-16 (×5): qty 1

## 2020-04-16 MED ORDER — PALIPERIDONE ER 3 MG PO TB24
3.0000 mg | ORAL_TABLET | Freq: Every day | ORAL | Status: DC
  Administered 2020-04-17 – 2020-04-20 (×5): 3 mg via ORAL
  Filled 2020-04-16 (×6): qty 1

## 2020-04-16 MED ORDER — MONTELUKAST SODIUM 10 MG PO TABS
10.0000 mg | ORAL_TABLET | Freq: Every day | ORAL | Status: DC
  Administered 2020-04-17 – 2020-04-20 (×5): 10 mg via ORAL
  Filled 2020-04-16 (×5): qty 1

## 2020-04-16 MED ORDER — ESCITALOPRAM OXALATE 10 MG PO TABS
20.0000 mg | ORAL_TABLET | Freq: Every day | ORAL | Status: DC
  Administered 2020-04-17 – 2020-04-20 (×5): 20 mg via ORAL
  Filled 2020-04-16 (×5): qty 2

## 2020-04-16 MED ORDER — DILTIAZEM HCL ER COATED BEADS 180 MG PO CP24
360.0000 mg | ORAL_CAPSULE | Freq: Every day | ORAL | Status: DC
  Administered 2020-04-17 – 2020-04-21 (×5): 360 mg via ORAL
  Filled 2020-04-16 (×5): qty 2

## 2020-04-16 MED ORDER — ENOXAPARIN SODIUM 30 MG/0.3ML ~~LOC~~ SOLN
30.0000 mg | Freq: Every day | SUBCUTANEOUS | Status: DC
  Administered 2020-04-17 – 2020-04-21 (×5): 30 mg via SUBCUTANEOUS
  Filled 2020-04-16 (×5): qty 0.3

## 2020-04-16 MED ORDER — ALBUTEROL SULFATE HFA 108 (90 BASE) MCG/ACT IN AERS
2.0000 | INHALATION_SPRAY | RESPIRATORY_TRACT | Status: DC | PRN
  Filled 2020-04-16: qty 6.7

## 2020-04-16 MED ORDER — AZELASTINE HCL 0.1 % NA SOLN
1.0000 | Freq: Two times a day (BID) | NASAL | Status: DC
  Administered 2020-04-17 – 2020-04-21 (×9): 1 via NASAL
  Filled 2020-04-16: qty 30

## 2020-04-16 MED ORDER — SACCHAROMYCES BOULARDII 250 MG PO CAPS
250.0000 mg | ORAL_CAPSULE | Freq: Two times a day (BID) | ORAL | Status: DC
  Administered 2020-04-17 – 2020-04-21 (×9): 250 mg via ORAL
  Filled 2020-04-16 (×10): qty 1

## 2020-04-16 MED ORDER — LORAZEPAM 0.5 MG PO TABS: 0.2500 mg | ORAL_TABLET | ORAL | Status: DC

## 2020-04-16 MED ORDER — FUROSEMIDE 10 MG/ML IJ SOLN
40.0000 mg | Freq: Two times a day (BID) | INTRAMUSCULAR | Status: AC
  Administered 2020-04-17 – 2020-04-18 (×4): 40 mg via INTRAVENOUS
  Filled 2020-04-16 (×4): qty 4

## 2020-04-16 MED ORDER — LORAZEPAM 0.5 MG PO TABS
0.2500 mg | ORAL_TABLET | Freq: Two times a day (BID) | ORAL | Status: DC
  Administered 2020-04-17 – 2020-04-21 (×9): 0.25 mg via ORAL
  Filled 2020-04-16 (×9): qty 1

## 2020-04-16 MED ORDER — PANTOPRAZOLE SODIUM 40 MG PO TBEC
40.0000 mg | DELAYED_RELEASE_TABLET | Freq: Every day | ORAL | Status: DC
  Administered 2020-04-17 – 2020-04-21 (×5): 40 mg via ORAL
  Filled 2020-04-16 (×5): qty 1

## 2020-04-16 MED ORDER — FUROSEMIDE 10 MG/ML IJ SOLN
40.0000 mg | Freq: Once | INTRAMUSCULAR | Status: AC
  Administered 2020-04-16: 40 mg via INTRAVENOUS
  Filled 2020-04-16: qty 4

## 2020-04-16 MED ORDER — SODIUM CHLORIDE 0.9% FLUSH
3.0000 mL | Freq: Two times a day (BID) | INTRAVENOUS | Status: DC
  Administered 2020-04-17 – 2020-04-21 (×10): 3 mL via INTRAVENOUS

## 2020-04-16 MED ORDER — ONDANSETRON 4 MG PO TBDP
4.0000 mg | ORAL_TABLET | Freq: Three times a day (TID) | ORAL | Status: DC | PRN
  Administered 2020-04-19: 4 mg via ORAL
  Filled 2020-04-16 (×2): qty 1

## 2020-04-16 MED ORDER — ADULT MULTIVITAMIN W/MINERALS CH
1.0000 | ORAL_TABLET | Freq: Every day | ORAL | Status: DC
  Administered 2020-04-17 – 2020-04-21 (×5): 1 via ORAL
  Filled 2020-04-16 (×5): qty 1

## 2020-04-16 MED ORDER — LORAZEPAM 0.5 MG PO TABS
0.5000 mg | ORAL_TABLET | Freq: Every day | ORAL | Status: DC
  Administered 2020-04-17 – 2020-04-20 (×5): 0.5 mg via ORAL
  Filled 2020-04-16 (×5): qty 1

## 2020-04-16 MED ORDER — LEVOTHYROXINE SODIUM 100 MCG PO TABS
200.0000 ug | ORAL_TABLET | Freq: Every day | ORAL | Status: DC
  Administered 2020-04-17 – 2020-04-21 (×5): 200 ug via ORAL
  Filled 2020-04-16 (×5): qty 2

## 2020-04-16 MED ORDER — ATORVASTATIN CALCIUM 80 MG PO TABS
80.0000 mg | ORAL_TABLET | Freq: Every day | ORAL | Status: DC
  Administered 2020-04-17 – 2020-04-21 (×5): 80 mg via ORAL
  Filled 2020-04-16 (×5): qty 1

## 2020-04-16 MED ORDER — ALBUTEROL SULFATE HFA 108 (90 BASE) MCG/ACT IN AERS
2.0000 | INHALATION_SPRAY | Freq: Once | RESPIRATORY_TRACT | Status: AC
  Administered 2020-04-16: 2 via RESPIRATORY_TRACT
  Filled 2020-04-16: qty 6.7

## 2020-04-16 MED ORDER — METOPROLOL SUCCINATE ER 100 MG PO TB24
100.0000 mg | ORAL_TABLET | Freq: Every morning | ORAL | Status: DC
  Administered 2020-04-17 – 2020-04-21 (×5): 100 mg via ORAL
  Filled 2020-04-16 (×5): qty 1

## 2020-04-16 MED ORDER — HYDRALAZINE HCL 25 MG PO TABS
25.0000 mg | ORAL_TABLET | Freq: Three times a day (TID) | ORAL | Status: DC
  Administered 2020-04-17 – 2020-04-21 (×14): 25 mg via ORAL
  Filled 2020-04-16 (×14): qty 1

## 2020-04-16 NOTE — ED Notes (Addendum)
Pt ambulated to bathroom and back to bed with minimal assistance. Pt still feels weak, but states she feels better than she did before. Dr. Roslynn Amble made aware

## 2020-04-16 NOTE — Social Work (Signed)
CSW met with Pt at bedside. Pt states that she has [facility] aide. TOC team will work to connect Pt with [facility] PT

## 2020-04-16 NOTE — ED Triage Notes (Signed)
Pt bib by ems from home for complaints of generalized weakness, trembling, and pitting edema. Pt fell x2 yesterday, pt was not utilizing her walker. A&Ox4. NNo complaints of pain.  Hx of CHF  Vitals: BP: palpated 118 HR: 60 SPO2: 93% RA
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
## 2020-04-17 ENCOUNTER — Other Ambulatory Visit: Payer: Self-pay

## 2020-04-17 DIAGNOSIS — I272 Pulmonary hypertension, unspecified: Secondary | ICD-10-CM | POA: Diagnosis present

## 2020-04-17 DIAGNOSIS — F32A Depression, unspecified: Secondary | ICD-10-CM | POA: Diagnosis present

## 2020-04-17 DIAGNOSIS — M1990 Unspecified osteoarthritis, unspecified site: Secondary | ICD-10-CM | POA: Diagnosis present

## 2020-04-17 DIAGNOSIS — I130 Hypertensive heart and chronic kidney disease with heart failure and stage 1 through stage 4 chronic kidney disease, or unspecified chronic kidney disease: Secondary | ICD-10-CM | POA: Diagnosis present

## 2020-04-17 DIAGNOSIS — Z6841 Body Mass Index (BMI) 40.0 and over, adult: Secondary | ICD-10-CM | POA: Diagnosis not present

## 2020-04-17 DIAGNOSIS — J45909 Unspecified asthma, uncomplicated: Secondary | ICD-10-CM | POA: Diagnosis present

## 2020-04-17 DIAGNOSIS — E785 Hyperlipidemia, unspecified: Secondary | ICD-10-CM | POA: Diagnosis present

## 2020-04-17 DIAGNOSIS — N184 Chronic kidney disease, stage 4 (severe): Secondary | ICD-10-CM | POA: Diagnosis present

## 2020-04-17 DIAGNOSIS — Z96651 Presence of right artificial knee joint: Secondary | ICD-10-CM | POA: Diagnosis present

## 2020-04-17 DIAGNOSIS — E039 Hypothyroidism, unspecified: Secondary | ICD-10-CM | POA: Diagnosis present

## 2020-04-17 DIAGNOSIS — E1122 Type 2 diabetes mellitus with diabetic chronic kidney disease: Secondary | ICD-10-CM | POA: Diagnosis present

## 2020-04-17 DIAGNOSIS — Z8249 Family history of ischemic heart disease and other diseases of the circulatory system: Secondary | ICD-10-CM | POA: Diagnosis not present

## 2020-04-17 DIAGNOSIS — I5033 Acute on chronic diastolic (congestive) heart failure: Secondary | ICD-10-CM | POA: Diagnosis present

## 2020-04-17 DIAGNOSIS — I509 Heart failure, unspecified: Secondary | ICD-10-CM | POA: Diagnosis present

## 2020-04-17 DIAGNOSIS — Z20822 Contact with and (suspected) exposure to covid-19: Secondary | ICD-10-CM | POA: Diagnosis present

## 2020-04-17 DIAGNOSIS — Z823 Family history of stroke: Secondary | ICD-10-CM | POA: Diagnosis not present

## 2020-04-17 DIAGNOSIS — Z8042 Family history of malignant neoplasm of prostate: Secondary | ICD-10-CM | POA: Diagnosis not present

## 2020-04-17 DIAGNOSIS — N179 Acute kidney failure, unspecified: Secondary | ICD-10-CM | POA: Diagnosis present

## 2020-04-17 DIAGNOSIS — E876 Hypokalemia: Secondary | ICD-10-CM | POA: Diagnosis present

## 2020-04-17 DIAGNOSIS — Z820 Family history of epilepsy and other diseases of the nervous system: Secondary | ICD-10-CM | POA: Diagnosis not present

## 2020-04-17 DIAGNOSIS — F419 Anxiety disorder, unspecified: Secondary | ICD-10-CM | POA: Diagnosis present

## 2020-04-17 DIAGNOSIS — E7800 Pure hypercholesterolemia, unspecified: Secondary | ICD-10-CM | POA: Diagnosis present

## 2020-04-17 DIAGNOSIS — I248 Other forms of acute ischemic heart disease: Secondary | ICD-10-CM | POA: Diagnosis present

## 2020-04-17 DIAGNOSIS — E669 Obesity, unspecified: Secondary | ICD-10-CM | POA: Diagnosis present

## 2020-04-17 DIAGNOSIS — K219 Gastro-esophageal reflux disease without esophagitis: Secondary | ICD-10-CM | POA: Diagnosis present

## 2020-04-17 LAB — CBC
HCT: 35.5 % — ABNORMAL LOW (ref 36.0–46.0)
Hemoglobin: 11.2 g/dL — ABNORMAL LOW (ref 12.0–15.0)
MCH: 27.6 pg (ref 26.0–34.0)
MCHC: 31.5 g/dL (ref 30.0–36.0)
MCV: 87.4 fL (ref 80.0–100.0)
Platelets: 265 10*3/uL (ref 150–400)
RBC: 4.06 MIL/uL (ref 3.87–5.11)
RDW: 16.1 % — ABNORMAL HIGH (ref 11.5–15.5)
WBC: 9 10*3/uL (ref 4.0–10.5)
nRBC: 0 % (ref 0.0–0.2)

## 2020-04-17 LAB — BASIC METABOLIC PANEL
Anion gap: 8 (ref 5–15)
BUN: 59 mg/dL — ABNORMAL HIGH (ref 8–23)
CO2: 30 mmol/L (ref 22–32)
Calcium: 9 mg/dL (ref 8.9–10.3)
Chloride: 98 mmol/L (ref 98–111)
Creatinine, Ser: 2.86 mg/dL — ABNORMAL HIGH (ref 0.44–1.00)
GFR, Estimated: 17 mL/min — ABNORMAL LOW (ref >60)
Glucose, Bld: 152 mg/dL — ABNORMAL HIGH (ref 70–99)
Potassium: 3.2 mmol/L — ABNORMAL LOW (ref 3.5–5.1)
Sodium: 136 mmol/L (ref 135–145)

## 2020-04-17 LAB — GLUCOSE, CAPILLARY
Glucose-Capillary: 123 mg/dL — ABNORMAL HIGH (ref 70–99)
Glucose-Capillary: 106 mg/dL — ABNORMAL HIGH (ref 70–99)
Glucose-Capillary: 184 mg/dL — ABNORMAL HIGH (ref 70–99)
Glucose-Capillary: 77 mg/dL (ref 70–99)

## 2020-04-17 LAB — CBG MONITORING, ED: Glucose-Capillary: 118 mg/dL — ABNORMAL HIGH (ref 70–99)

## 2020-04-17 MED ORDER — POTASSIUM CHLORIDE CRYS ER 20 MEQ PO TBCR
40.0000 meq | EXTENDED_RELEASE_TABLET | Freq: Two times a day (BID) | ORAL | Status: AC
  Administered 2020-04-17 (×2): 40 meq via ORAL
  Filled 2020-04-17 (×2): qty 2

## 2020-04-17 MED ORDER — GUAIFENESIN-DM 100-10 MG/5ML PO SYRP
5.0000 mL | ORAL_SOLUTION | ORAL | Status: DC | PRN
  Administered 2020-04-17 – 2020-04-21 (×3): 5 mL via ORAL
  Filled 2020-04-17 (×3): qty 5

## 2020-04-17 MED ORDER — INSULIN GLARGINE 100 UNIT/ML ~~LOC~~ SOLN
12.0000 [IU] | Freq: Every day | SUBCUTANEOUS | Status: DC
  Administered 2020-04-17 – 2020-04-21 (×5): 12 [IU] via SUBCUTANEOUS
  Filled 2020-04-17 (×7): qty 0.12

## 2020-04-17 MED ORDER — LIP MEDEX EX OINT
TOPICAL_OINTMENT | CUTANEOUS | Status: DC | PRN
  Filled 2020-04-17: qty 7

## 2020-04-17 MED ORDER — POLYETHYLENE GLYCOL 3350 17 G PO PACK
17.0000 g | PACK | Freq: Every day | ORAL | Status: DC | PRN
  Administered 2020-04-21: 17 g via ORAL
  Filled 2020-04-17 (×2): qty 1

## 2020-04-17 MED ORDER — INSULIN ASPART 100 UNIT/ML ~~LOC~~ SOLN
0.0000 [IU] | Freq: Three times a day (TID) | SUBCUTANEOUS | Status: DC
  Administered 2020-04-17: 3 [IU] via SUBCUTANEOUS
  Administered 2020-04-17 – 2020-04-18 (×2): 2 [IU] via SUBCUTANEOUS
  Administered 2020-04-18 – 2020-04-19 (×5): 3 [IU] via SUBCUTANEOUS
  Administered 2020-04-20: 2 [IU] via SUBCUTANEOUS
  Administered 2020-04-20 – 2020-04-21 (×3): 3 [IU] via SUBCUTANEOUS

## 2020-04-17 NOTE — ED Notes (Signed)
Pt placed on hospital bed

## 2020-04-17 NOTE — Evaluation (Signed)
Physical Therapy Evaluation Patient Details Name: Amanda Cain MRN: [id number] DOB: [birth year] Today's Date: 04/17/2020   History of Present Illness  74 yo female presenting with weakness, edema, and dyspnea. Also s/p several falls at home. PMH including chronic diastolic heart failure, CKD stage 4, type 2 diabetes, hyperlipidemia, hypertension, GERD, hypothyroidism, pulmonary hypertension, obesity.  Clinical Impression  Pt is up to walk and maneuver in BR without much assistance, but to get in and out of bed is mod assist.  Her LE weight is impeding independence, and pt is aware that this is a struggle. Pt has been compensating this at home using a recliner, but unless her caregiver is assisting adequately, her limits of use of LE's will be a great fall risk.  Therefore, unless the caregiver situation is reliable, pt is not safe to go directly home.  Pt is expecting to have rehab, and the configuration of this is going to depend on support.  Follow for goals of PT and will upgrade her discharge recommendations based on the available help.      Follow Up Recommendations SNF    Equipment Recommendations  Other (comment) (Rollator for bariatric patient)    Recommendations for Other Services       Precautions / Restrictions Precautions Precautions: Fall Precaution Comments: monitor telemetry values Restrictions Weight Bearing Restrictions: No      Mobility  Bed Mobility Overal bed mobility: Needs Assistance Bed Mobility: Supine to Sit;Sit to Supine     Supine to sit: Mod assist Sit to supine: Mod assist   General bed mobility comments: LE weight is making pt require help to manage on the bed    Transfers Overall transfer level: Needs assistance Equipment used: 4-wheeled walker (bariatric walker) Transfers: Sit to/from Stand Sit to Stand: Min guard            Ambulation/Gait Ambulation/Gait assistance: Min guard;Min assist Gait Distance (Feet): 50 Feet (20 +20 +  10) Assistive device: 4-wheeled walker Gait Pattern/deviations: Step-through pattern;Step-to pattern;Wide base of support;Decreased stride length Gait velocity: reduced Gait velocity interpretation: <1.31 ft/sec, indicative of household ambulator General Gait Details: pt is using bariwalker with 4 wheels, more stable due to weight and breadth of the device.  Stairs            Wheelchair Mobility    Modified [place] (Stroke Patients Only)       Balance Overall balance assessment: Needs assistance Sitting-balance support: Feet supported Sitting balance-Leahy Scale: Fair     Standing balance support: Bilateral upper extremity supported;During functional activity Standing balance-Leahy Scale: Poor (better with one hand to steady)                               Pertinent Vitals/Pain Pain Assessment: No/denies pain    Home Living Family/patient expects to be discharged to:: Private residence Living Arrangements: Alone Available Help at Discharge: Personal care attendant;Family;Friend(s);Available PRN/intermittently Type of Home: Apartment Home Access: Level entry     Home Layout: One level Home Equipment: Walker - 2 wheels;Cane - single point;Shower seat;Grab bars - tub/shower;Toilet riser Additional Comments: aide 4hrs/day 7 days/wk    Prior Function Level of Independence: Needs assistance   Gait / Transfers Assistance Needed: RW most recently  ADL's / Homemaking Assistance Needed: aide helps with some cooking and bathing of pt  Comments: pt is in recliner to sleep per family, also concerned for her level of help from the caregiver  Hand Dominance   Dominant Hand: Right    Extremity/Trunk Assessment   Upper Extremity Assessment Upper Extremity Assessment: Defer to OT evaluation    Lower Extremity Assessment Lower Extremity Assessment: Generalized weakness    Cervical / Trunk Assessment Cervical / Trunk Assessment: Other  exceptions Cervical / Trunk Exceptions: BMI is impeding her independence along with weakness, esp to lift LE's  Communication   Communication: No difficulties  Cognition Arousal/Alertness: Awake/alert Behavior During Therapy: WFL for tasks assessed/performed Overall Cognitive Status: Within Functional Limits for tasks assessed                                        General Comments General comments (skin integrity, edema, etc.): sats remain in 90+% range with all mobility    Exercises     Assessment/Plan    PT Assessment Patient needs continued PT services  PT Problem List Decreased strength;Decreased range of motion;Decreased activity tolerance;Decreased balance;Decreased mobility;Decreased knowledge of use of DME;Cardiopulmonary status limiting activity       PT Treatment Interventions DME instruction;Gait training;Functional mobility training;Therapeutic activities;Therapeutic exercise;Balance training;Neuromuscular re-education;Patient/family education    PT Goals (Current goals can be found in the Care Plan section)  Acute Rehab PT Goals Patient Stated Goal: to be stronger PT Goal Formulation: With patient Time For Goal Achievement: 05/01/20 Potential to Achieve Goals: Good    Frequency Min 3X/week   Barriers to discharge Decreased caregiver support concern is that pt is not getting sufficient help at home    Co-evaluation               AM-PAC PT "6 Clicks" Mobility  Outcome Measure Help needed turning from your back to your side while in a flat bed without using bedrails?: A Little Help needed moving from lying on your back to sitting on the side of a flat bed without using bedrails?: A Lot Help needed moving to and from a bed to a chair (including a wheelchair)?: A Lot Help needed standing up from a chair using your arms (e.g., wheelchair or bedside chair)?: A Little Help needed to walk in hospital room?: A Little Help needed climbing 3-5  steps with a railing? : Total 6 Click Score: 14    End of Session Equipment Utilized During Treatment: Gait belt Activity Tolerance: Patient tolerated treatment well;Treatment limited secondary to medical complications (Comment);Patient limited by fatigue Patient left: in bed;with call bell/phone within reach;with bed alarm set Nurse Communication: Mobility status PT Visit Diagnosis: Unsteadiness on feet (R26.81);Muscle weakness (generalized) (M62.81);Difficulty in walking, not elsewhere classified (R26.2)    Time: 0962-8366 PT Time Calculation (min) (ACUTE ONLY): 26 min   Charges:   PT Evaluation $PT Eval Moderate Complexity: 1 Mod PT Treatments $Gait Training: 8-22 mins       Ramond Dial 04/17/2020, 4:33 PM  Mee Hives, PT MS Acute Rehab Dept. Number: [place]

## 2020-04-17 NOTE — Evaluation (Signed)
Occupational Therapy Evaluation Patient Details Name: Amanda Cain MRN: [id number] DOB: 03-01-1946 Today's Date: 04/17/2020    History of Present Illness  74 yo female presenting with weakness, edema, and dyspnea. Also s/p several falls at home. PMH including chronic diastolic heart failure, CKD stage 4, type 2 diabetes, hyperlipidemia, hypertension, GERD, hypothyroidism, pulmonary hypertension, obesity.   Clinical Impression   PTA, pt was living alone and performing BADLs and recently using RW. Pt with aide 7 days a week who performs IADLs; family reporting concern about performance of IADLs and support provided to pt at home. Pt currently requiring Supervision-Min Guard A for ADLs and functional mobility with use of RW. Pt presenting with decreased activity tolerance as seen by fatigue. HR 70 at rest and jumping into 90s with activity. Pt would benefit from further acute OT to facilitate safe dc. Recommend dc to home with HHOT for further OT to optimize safety, independence with ADLs, and return to PLOF.     Follow Up Recommendations  Home health OT;Supervision - Intermittent ; [place] aide   Equipment Recommendations  None recommended by OT    Recommendations for Other Services PT consult     Precautions / Restrictions Precautions Precautions: Fall      Mobility Bed Mobility               General bed mobility comments: Standing at sink performing oral care with NT upon arrival    Transfers Overall transfer level: Needs assistance Equipment used: Rolling walker (2 wheeled) Transfers: Sit to/from Stand Sit to Stand: Supervision         General transfer comment: Supervision for safety    Balance Overall balance assessment: Needs assistance Sitting-balance support: No upper extremity supported;Feet supported Sitting balance-Leahy Scale: Fair     Standing balance support: No upper extremity supported;During functional activity Standing balance-Leahy Scale:  Fair                             ADL either performed or assessed with clinical judgement   ADL Overall ADL's : Needs assistance/impaired Eating/Feeding: Set up;Sitting   Grooming: Set up;Supervision/safety;Wash/dry hands;Standing;Wash/dry face;Oral care   Upper Body Bathing: Supervision/ safety;Set up;Sitting   Lower Body Bathing: Min guard;Sit to/from stand   Upper Body Dressing : Supervision/safety;Set up;Sitting   Lower Body Dressing: Min guard;Sit to/from stand   Toilet Transfer: Supervision/safety;Ambulation;Regular Toilet;RW;Grab bars   Toileting- Clothing Manipulation and Hygiene: Supervision/safety;Sit to/from stand       Functional mobility during ADLs: Min guard;Rolling walker General ADL Comments: Pt performing at [place] level. Pt presenting with shortness of breath and elevate HR jumping from 70 to 90s with activity.      Vision         Perception     Praxis      Pertinent Vitals/Pain Pain Assessment: No/denies pain     Hand Dominance Right   Extremity/Trunk Assessment Upper Extremity Assessment Upper Extremity Assessment: Generalized weakness   Lower Extremity Assessment Lower Extremity Assessment: Defer to PT evaluation   Cervical / Trunk Assessment Cervical / Trunk Assessment: Other exceptions Cervical / Trunk Exceptions: Increased body habitus   Communication Communication Communication: No difficulties   Cognition Arousal/Alertness: Awake/alert Behavior During Therapy: WFL for tasks assessed/performed Overall Cognitive Status: Within Functional Limits for tasks assessed  General Comments  SpO2 95% on RA. Noting shortness of breath with acitivty. HR 70s; elevating to 90s with activity    Exercises     Shoulder Instructions      Home Living Family/patient expects to be discharged to:: Private residence Living Arrangements: Alone Available Help at  Discharge: Personal care attendant;Family;Friend(s);Available PRN/intermittently Type of Home: Apartment Home Access: Level entry     Home Layout: One level     Bathroom Shower/Tub: Teacher, early years/pre: Standard (Toilet riser with handle bars)     Home Equipment: Walker - 2 wheels;Cane - single point;Shower seat;Grab bars - tub/shower;Toilet riser   Additional Comments: aide 4hrs/day 7 days/wk      Prior Functioning/Environment Level of Independence: Needs assistance  Gait / Transfers Assistance Needed: Was using cane, but recently has been using RW. ADL's / Homemaking Assistance Needed: Aide does IADLs and helps with drying her off after bathes. Family does driving and grocery shopping.    Comments: Sister verbalizing concern about aide's performance. Pt's sister also reports that pt spends most of her time in recliner as well as sleeps in recliner.        OT Problem List: Decreased strength;Decreased range of motion;Decreased activity tolerance;Impaired balance (sitting and/or standing);Decreased knowledge of use of DME or AE;Decreased knowledge of precautions;Cardiopulmonary status limiting activity      OT Treatment/Interventions: Self-care/ADL training;Therapeutic exercise;Energy conservation;DME and/or AE instruction;Therapeutic activities;Patient/family education    OT Goals(Current goals can be found in the care plan section) Acute Rehab OT Goals Patient Stated Goal: Go home OT Goal Formulation: With patient Time For Goal Achievement: 05/01/20 Potential to Achieve Goals: Good  OT Frequency: Min 3X/week   Barriers to D/C:            Co-evaluation              AM-PAC OT "6 Clicks" Daily Activity     Outcome Measure Help from another person eating meals?: A Little Help from another person taking care of personal grooming?: A Little Help from another person toileting, which includes using toliet, bedpan, or urinal?: A Little Help from another  person bathing (including washing, rinsing, drying)?: A Little Help from another person to put on and taking off regular upper body clothing?: A Little Help from another person to put on and taking off regular lower body clothing?: A Little 6 Click Score: 18   End of Session Equipment Utilized During Treatment: Rolling walker Nurse Communication: Mobility status  Activity Tolerance: Patient tolerated treatment well Patient left: in chair;with call bell/phone within reach;with family/visitor present;with nursing/sitter in room  OT Visit Diagnosis: Unsteadiness on feet (R26.81);Other abnormalities of gait and mobility (R26.89);Muscle weakness (generalized) (M62.81)                Time: 6812-7517 OT Time Calculation (min): 21 min Charges:  OT General Charges $OT Visit: 1 Visit OT Evaluation $OT Eval Low Complexity: [place], OTR/L Acute Rehab Pager: [phone] Office: [place] 04/17/2020, 8:46 AM

## 2020-04-17 NOTE — Progress Notes (Signed)
PROGRESS NOTE    Amanda Cain  ZDG:[id number] DOB: 19-Jan-1946 DOA: 04/16/2020 PCP: Mayra Neer, MD     Brief Narrative:  Amanda Cain is a 74 year old female with past medical history significant for chronic diastolic heart failure, CKD stage 4, type 2 diabetes, hyperlipidemia, hypertension, GERD, hypothyroidism, pulmonary hypertension, obesity who presents with weakness, edema and dyspnea on exertion.  This has been going on for several days.  She also has had couple falls in the last day.  In the emergency department, she was found to have changes consistent with exacerbation of her diastolic heart failure.  New events last 24 hours / Subjective: Patient sitting in recliner, eating breakfast.  She states that she feels better since admission, continues to have bilateral lower extremity edema which is not back to her baseline.  Sister is at bedside.  Assessment & Plan:   Principal Problem:   Acute exacerbation of CHF (congestive heart failure) (HCC) Active Problems:   Diabetes ([place])   Acute kidney injury superimposed on CKD (HCC)   Essential hypertension   Dyslipidemia   Asthma   Physical deconditioning   Acute on chronic diastolic heart failure -Continue IV Lasix, Cardizem, Toprol -Strict I's and O's, daily weight  CKD stage IV -Baseline creatinine 2.6-2.9 -Stable  Demand ischemia -Without complaints of chest pain.  Troponin has been flat 21 --> 21  Hypokalemia -Replace, trend  Hypertension -Continue hydralazine, Lasix, Cardizem, metoprolol  Weakness -PT OT  Asthma -Continue Singulair, Breo, albuterol  Diabetes mellitus type 2 -Continue Lantus, sliding scale insulin  Hyperlipidemia -Continue lipitor, Zetia  GERD -Continue PPI  Hypothyroidism -Continue Synthroid  Depression/anxiety -Continue Lexapro, Ativan    DVT prophylaxis:  enoxaparin (LOVENOX) injection 30 mg Start: 04/17/20 1000  Code Status: Full code Family Communication:  Sister at bedside Disposition Plan:  Status is: Observation  The patient will require care spanning > 2 midnights and should be moved to inpatient because: IV treatments appropriate due to intensity of illness or inability to take PO  Dispo: The patient is from: Home              Anticipated d/c is to: Home              Anticipated d/c date is: 1 day              Patient currently is not medically stable to d/c.  Continue IV diuresis.   Consultants:   None  Procedures:   None  Antimicrobials:  Anti-infectives (From admission, onward)   None        Objective: Vitals:   04/17/20 0205 04/17/20 0249 04/17/20 0417 04/17/20 0733  BP:  (!) 157/62 (!) 162/59 (!) 160/65  Pulse: 65 64 62 64  Resp: 13 (!) 22 15 20   Temp: (!) 97.5 F (36.4 C)  97.6 F (36.4 C) (!) 97.2 F (36.2 C)  TempSrc: Oral  Oral Oral  SpO2:  96% 98% 98%  Weight: 95.9 kg  95.9 kg   Height:        Intake/Output Summary (Last 24 hours) at 04/17/2020 1217 Last data filed at 04/17/2020 0840 Gross per 24 hour  Intake 3 ml  Output 500 ml  Net -497 ml   Filed Weights   04/16/20 1639 04/17/20 0205 04/17/20 0417  Weight: 95.3 kg 95.9 kg 95.9 kg    Examination:  General exam: Appears calm and comfortable  Respiratory system: Clear to auscultation. Respiratory effort normal. No respiratory distress. No conversational dyspnea.  Cardiovascular  system: S1 & S2 heard, RRR. No murmurs. +Bilateral pedal edema. Gastrointestinal system: Abdomen is nondistended, soft and nontender. Normal bowel sounds heard. Central nervous system: Alert and oriented. No focal neurological deficits. Speech clear.  Extremities: Symmetric in appearance  Skin: No rashes, lesions or ulcers on exposed skin  Psychiatry: Judgement and insight appear normal. Mood & affect appropriate.   Data Reviewed: I have personally reviewed following labs and imaging studies  CBC: Recent Labs  Lab 04/16/20 1719 04/17/20 0513  WBC 10.7* 9.0    NEUTROABS 8.7*  --   HGB 11.5* 11.2*  HCT 37.2 35.5*  MCV 88.6 87.4  PLT 300 270   Basic Metabolic Panel: Recent Labs  Lab 04/16/20 1719 04/17/20 0513  NA 134* 136  K 3.9 3.2*  CL 94* 98  CO2 29 30  GLUCOSE 128* 152*  BUN 64* 59*  CREATININE 3.01* 2.86*  CALCIUM 9.5 9.0   GFR: Estimated Creatinine Clearance: 17 mL/min (A) (by C-G formula based on SCr of 2.86 mg/dL (H)). Liver Function Tests: Recent Labs  Lab 04/16/20 1719  AST 22  ALT 28  ALKPHOS 65  BILITOT 0.6  PROT 6.3*  ALBUMIN 3.2*   No results for input(s): LIPASE, AMYLASE in the last 168 hours. No results for input(s): AMMONIA in the last 168 hours. Coagulation Profile: No results for input(s): INR, PROTIME in the last 168 hours. Cardiac Enzymes: No results for input(s): CKTOTAL, CKMB, CKMBINDEX, TROPONINI in the last 168 hours. BNP (last 3 results) No results for input(s): PROBNP in the last 8760 hours. HbA1C: No results for input(s): HGBA1C in the last 72 hours. CBG: Recent Labs  Lab 04/17/20 0131 04/17/20 0734  GLUCAP 118* 123*   Lipid Profile: No results for input(s): CHOL, HDL, LDLCALC, TRIG, CHOLHDL, LDLDIRECT in the last 72 hours. Thyroid Function Tests: No results for input(s): TSH, T4TOTAL, FREET4, T3FREE, THYROIDAB in the last 72 hours. Anemia Panel: No results for input(s): VITAMINB12, FOLATE, FERRITIN, TIBC, IRON, RETICCTPCT in the last 72 hours. Sepsis Labs: No results for input(s): PROCALCITON, LATICACIDVEN in the last 168 hours.  Recent Results (from the past 240 hour(s))  Respiratory Panel by RT PCR (Flu A&B, Covid) - Nasopharyngeal Swab     Status: None   Collection Time: 04/16/20  5:54 PM   Specimen: Nasopharyngeal Swab; Nasopharyngeal(NP) swabs in vial transport medium  Result Value Ref Range Status   SARS Coronavirus 2 by RT PCR NEGATIVE NEGATIVE Final    Comment: (NOTE) SARS-CoV-2 target nucleic acids are NOT DETECTED.  The SARS-CoV-2 RNA is generally detectable in  upper respiratoy specimens during the acute phase of infection. The lowest concentration of SARS-CoV-2 viral copies this assay can detect is 131 copies/mL. A negative result does not preclude SARS-Cov-2 infection and should not be used as the sole basis for treatment or other patient management decisions. A negative result may occur with  improper specimen collection/handling, submission of specimen other than nasopharyngeal swab, presence of viral mutation(s) within the areas targeted by this assay, and inadequate number of viral copies (<131 copies/mL). A negative result must be combined with clinical observations, patient history, and epidemiological information. The expected result is Negative.  Fact Sheet for Patients:  PinkCheek.be  Fact Sheet for Healthcare Providers:  GravelBags.it  This test is no t yet approved or cleared by the [place] FDA and  has been authorized for detection and/or diagnosis of SARS-CoV-2 by FDA under an Emergency Use Authorization (EUA). This EUA will remain  in effect (meaning  this test can be used) for the duration of the COVID-19 declaration under Section 564(b)(1) of the Act, 21 U.S.C. section 360bbb-3(b)(1), unless the authorization is terminated or revoked sooner.     Influenza A by PCR NEGATIVE NEGATIVE Final   Influenza B by PCR NEGATIVE NEGATIVE Final    Comment: (NOTE) The Xpert Xpress SARS-CoV-2/FLU/RSV assay is intended as an aid in  the diagnosis of influenza from Nasopharyngeal swab specimens and  should not be used as a sole basis for treatment. Nasal washings and  aspirates are unacceptable for Xpert Xpress SARS-CoV-2/FLU/RSV  testing.  Fact Sheet for Patients: PinkCheek.be  Fact Sheet for Healthcare Providers: GravelBags.it  This test is not yet approved or cleared by the [place] FDA and  has been  authorized for detection and/or diagnosis of SARS-CoV-2 by  FDA under an Emergency Use Authorization (EUA). This EUA will remain  in effect (meaning this test can be used) for the duration of the  Covid-19 declaration under Section 564(b)(1) of the Act, 21  U.S.C. section 360bbb-3(b)(1), unless the authorization is  terminated or revoked. Performed at [facility], [place] [address] [place], [place] [address]       Radiology Studies: DG Chest Portable 1 View  Result Date: 04/16/2020 CLINICAL DATA:  Shortness of breath EXAM: PORTABLE CHEST 1 VIEW COMPARISON:  February 20, 2020 FINDINGS: There is scarring in the left mid lung. The lungs elsewhere are clear. Heart is mildly enlarged with pulmonary vascularity normal. No adenopathy. There are surgical clips in the right supraclavicular region, stable. IMPRESSION: Scarring left mid lung. No edema or airspace opacity. Stable cardiomegaly. No adenopathy. Electronically Signed   By: Lowella Grip III M.D.   On: 04/16/2020 18:07      Scheduled Meds: . atorvastatin  80 mg Oral Daily  . azelastine  1 spray Each Nare BID  . diltiazem  360 mg Oral Daily  . enoxaparin (LOVENOX) injection  30 mg Subcutaneous Daily  . escitalopram  20 mg Oral QHS  . estradiol  0.5 mg Oral q morning - 10a  . ezetimibe  10 mg Oral Daily  . fluticasone furoate-vilanterol  1 puff Inhalation Daily  . furosemide  40 mg Intravenous BID  . hydrALAZINE  25 mg Oral Q8H  . insulin aspart  0-15 Units Subcutaneous TID WC  . insulin glargine  12 Units Subcutaneous QHS  . levothyroxine  200 mcg Oral Q0600  . LORazepam  0.25 mg Oral BID WC   And  . LORazepam  0.5 mg Oral QHS  . metoprolol succinate  100 mg Oral q morning - 10a  . montelukast  10 mg Oral QHS  . multivitamin with minerals  1 tablet Oral Daily  . paliperidone  3 mg Oral QHS  . pantoprazole  40 mg Oral Daily  . potassium chloride  40 mEq Oral BID  . saccharomyces boulardii  250 mg Oral BID  .  sodium chloride flush  3 mL Intravenous Q12H  . sucralfate  1 g Oral BID WC   Continuous Infusions:   LOS: 0 days      Time spent: 35 minutes   Dessa Phi, DO [facility] 04/17/2020, 12:17 PM   Available via Epic secure chat 7am-7pm After these hours, please refer to coverage provider listed on [email or web address]

## 2020-04-18 LAB — BASIC METABOLIC PANEL
Anion gap: 10 (ref 5–15)
BUN: 59 mg/dL — ABNORMAL HIGH (ref 8–23)
CO2: 27 mmol/L (ref 22–32)
Calcium: 8.9 mg/dL (ref 8.9–10.3)
Chloride: 98 mmol/L (ref 98–111)
Creatinine, Ser: 2.84 mg/dL — ABNORMAL HIGH (ref 0.44–1.00)
GFR, Estimated: 17 mL/min — ABNORMAL LOW (ref >60)
Glucose, Bld: 146 mg/dL — ABNORMAL HIGH (ref 70–99)
Potassium: 4.5 mmol/L (ref 3.5–5.1)
Sodium: 135 mmol/L (ref 135–145)

## 2020-04-18 LAB — GLUCOSE, CAPILLARY
Glucose-Capillary: 124 mg/dL — ABNORMAL HIGH (ref 70–99)
Glucose-Capillary: 157 mg/dL — ABNORMAL HIGH (ref 70–99)
Glucose-Capillary: 151 mg/dL — ABNORMAL HIGH (ref 70–99)
Glucose-Capillary: 114 mg/dL — ABNORMAL HIGH (ref 70–99)

## 2020-04-18 MED ORDER — TORSEMIDE 20 MG PO TABS
100.0000 mg | ORAL_TABLET | Freq: Every day | ORAL | Status: DC
  Administered 2020-04-19 – 2020-04-21 (×3): 100 mg via ORAL
  Filled 2020-04-18 (×3): qty 5

## 2020-04-18 NOTE — TOC Initial Note (Signed)
Transition of Care [facility]) - Initial/Assessment Note    Patient Details  Name: Amanda Cain MRN: [id number] Date of Birth: 02/19/1946  Transition of Care [facility]) CM/SW Contact:    Trula Ore, [place] Phone Number: 04/18/2020, 10:02 AM  Clinical Narrative:                  CSW received consult for possible SNF placement at time of discharge. Patient comes from home alone. CSW spoke with patient at bedside along with patients sister [name] regarding PT recommendation of SNF placement at time of discharge.  Patient expressed understanding of PT recommendation and is agreeable to SNF placement at time of discharge. Patient reports preference for [facility] . Patient gave CSW permission to fax out initial referral near the [place] area. Patient has received the COVID vaccines. No further questions reported at this time. CSW to continue to follow and assist with discharge planning needs.  Expected Discharge Plan: Skilled Nursing Facility Barriers to Discharge: Continued Medical Work up   Patient Goals and CMS Choice Patient states their goals for this hospitalization and ongoing recovery are:: to go to SNF CMS Medicare.gov Compare Post Acute Care list provided to:: Patient Choice offered to / list presented to : Patient  Expected Discharge Plan and Services Expected Discharge Plan: [place]       Living arrangements for the past 2 months: Single Family Home                                      Prior Living Arrangements/Services Living arrangements for the past 2 months: Single Family Home Lives with:: Self Patient language and need for interpreter reviewed:: Yes Do you feel safe going back to the place where you live?: No   SNF  Need for Family Participation in Patient Care: Yes (Comment) Care giver support system in place?: Yes (comment)   Criminal Activity/Legal Involvement Pertinent to Current Situation/Hospitalization: No - Comment as  needed  Activities of Daily Living Home Assistive Devices/Equipment: Dentures (specify type), Eyeglasses, Shower chair with back, Blood pressure cuff ADL Screening (condition at time of admission) Patient's cognitive ability adequate to safely complete daily activities?: Yes Is the patient deaf or have difficulty hearing?: No Does the patient have difficulty seeing, even when wearing glasses/contacts?: No Does the patient have difficulty concentrating, remembering, or making decisions?: No Patient able to express need for assistance with ADLs?: Yes Does the patient have difficulty dressing or bathing?: No Independently performs ADLs?: Yes (appropriate for developmental age) Does the patient have difficulty walking or climbing stairs?: Yes Weakness of Legs: Both Weakness of Arms/Hands: None  Permission Sought/Granted Permission sought to share information with : Case Manager, Customer service manager, Family Supports Permission granted to share information with : Yes, Verbal Permission Granted  Share Information with NAME: [name]  Permission granted to share info w AGENCY: SNF  Permission granted to share info w Relationship: sister  Permission granted to share info w Contact Information: [name] [phone]  Emotional Assessment Appearance:: Appears stated age Attitude/Demeanor/Rapport: Gracious Affect (typically observed): Calm Orientation: : Oriented to Self, Oriented to Place, Oriented to  Time, Oriented to Situation Alcohol / Substance Use: Not Applicable Psych Involvement: No (comment)  Admission diagnosis:  Acute exacerbation of CHF (congestive heart failure) ([place]) [I50.9] Acute renal failure superimposed on chronic kidney disease, unspecified CKD stage, unspecified acute renal failure type ([place]) [N17.9, N18.9] Heart failure,  unspecified HF chronicity, unspecified heart failure type [facility]) [I50.9] Patient Active Problem List   Diagnosis Date Noted  . Acute  exacerbation of CHF (congestive heart failure) ([place]) 04/16/2020  . Acute on chronic diastolic (congestive) heart failure ([place]) 02/20/2020  . Hypokalemia   . Anxiety   . Physical deconditioning 03/13/2019  . Acute recurrent maxillary sinusitis 03/13/2019  . Pain due to onychomycosis of toenails of both feet 11/21/2018  . Shortness of breath 08/09/2018  . Asthma 06/19/2018  . Chronic respiratory failure with hypoxia ([place]) 05/15/2018  . Pulmonary hypertension ([place]) 05/14/2018  . Atypical chest pain 05/12/2018  . Community acquired pneumonia 12/13/2017  . CAP (community acquired pneumonia) 12/13/2017  . CHF (congestive heart failure) ([place]) 12/12/2017  . Morbid obesity ([place]) 11/21/2017  . Flu 07/07/2017  . Influenza A 07/05/2017  . Acute respiratory failure with hypoxia ([place]) 07/05/2017  . Ascites   . Acute on chronic diastolic heart failure ([place]) 11/17/2016  . CKD (chronic kidney disease), stage III ([place]) 11/17/2016  . S/P laparoscopic cholecystectomy 03/02/2015  . Hyperlipidemia 11/25/2012  . Depression 11/25/2012  . GERD (gastroesophageal reflux disease) 11/25/2012  . Postoperative anemia due to acute blood loss 11/01/2012  . Osteoarthritis of right knee 10/31/2012  . Hyperglycemia 11/30/2011  . SBP (spontaneous bacterial peritonitis) ([place]) 11/25/2011    Class: Acute  . Syncope and collapse 11/20/2011  . Abdominal pain 11/19/2011  . Hyponatremia 11/19/2011  . Anemia 11/19/2011  . Acute kidney injury superimposed on CKD ([place]) 11/19/2011  . Moderate protein-calorie malnutrition ([place]) 11/19/2011  . Essential hypertension 11/19/2011  . Hypothyroidism, acquired 11/19/2011  . Dyslipidemia 11/19/2011  . Diabetes ([place]) 07/15/2011   PCP:  Mayra Neer, MD Pharmacy:   [facility] [place], [place] - [address] AT [address] [place] [address] Phone: [phone] Fax: [phone]     Social Determinants  of Health (SDOH) Interventions    Readmission Risk Interventions Readmission Risk Prevention Plan 02/26/2020  Transportation Screening Complete  Medication Review ([place]) Complete  PCP or Specialist appointment within 3-5 days of discharge Complete  SW Recovery Care/Counseling Consult Complete  [place] Patient Refused  Some recent data might be hidden

## 2020-04-18 NOTE — TOC Progression Note (Signed)
Transition of Care [facility]) - Progression Note    Patient Details  Name: Amanda Cain MRN: [id number] Date of Birth: 12-11-1945  Transition of Care [facility]) CM/SW [place], [place] Phone Number: 04/18/2020, 10:47 AM  Clinical Narrative:     CSW started insurance authorization for patient. Requested start date is 11/21. Reference number #[id number].  CSW will continue to follow.   Expected Discharge Plan: [place] Barriers to Discharge: Continued Medical Work up  Expected Discharge Plan and Services Expected Discharge Plan: [place] arrangements for the past 2 months: Single Family Home                                       Social Determinants of Health (SDOH) Interventions    Readmission Risk Interventions Readmission Risk Prevention Plan 02/26/2020  Transportation Screening Complete  Medication Review ([place]) Complete  PCP or Specialist appointment within 3-5 days of discharge Complete  SW Recovery Care/Counseling Consult Complete  [place] Patient Refused  Some recent data might be hidden

## 2020-04-18 NOTE — NC FL2 (Signed)
[place] LEVEL OF CARE SCREENING TOOL     IDENTIFICATION  Patient Name: Amanda Cain Birthdate: 04/21/1946 Sex: female Admission Date (Current Location): 04/16/2020  [facility] and [place] Number:  Herbalist and Address:  [employer] [facility], [place] [address], [place], [place] [address]      Provider Number: [id number]  Attending Physician Name and Address:  Dessa Phi, DO  Relative Name and Phone Number:  [name] [phone]    Current Level of Care: Hospital Recommended Level of Care: [place] Prior Approval Number:    Date Approved/Denied:   PASRR Number: [id number]  Discharge Plan: SNF    Current Diagnoses: Patient Active Problem List   Diagnosis Date Noted  . Acute exacerbation of CHF (congestive heart failure) ([place]) 04/16/2020  . Acute on chronic diastolic (congestive) heart failure ([place]) 02/20/2020  . Hypokalemia   . Anxiety   . Physical deconditioning 03/13/2019  . Acute recurrent maxillary sinusitis 03/13/2019  . Pain due to onychomycosis of toenails of both feet 11/21/2018  . Shortness of breath 08/09/2018  . Asthma 06/19/2018  . Chronic respiratory failure with hypoxia ([place]) 05/15/2018  . Pulmonary hypertension ([place]) 05/14/2018  . Atypical chest pain 05/12/2018  . Community acquired pneumonia 12/13/2017  . CAP (community acquired pneumonia) 12/13/2017  . CHF (congestive heart failure) ([place]) 12/12/2017  . Morbid obesity ([place]) 11/21/2017  . Flu 07/07/2017  . Influenza A 07/05/2017  . Acute respiratory failure with hypoxia ([place]) 07/05/2017  . Ascites   . Acute on chronic diastolic heart failure ([place]) 11/17/2016  . CKD (chronic kidney disease), stage III ([place]) 11/17/2016  . S/P laparoscopic cholecystectomy 03/02/2015  . Hyperlipidemia 11/25/2012  . Depression 11/25/2012  . GERD (gastroesophageal reflux disease) 11/25/2012  . Postoperative anemia due to acute blood loss 11/01/2012  .  Osteoarthritis of right knee 10/31/2012  . Hyperglycemia 11/30/2011  . SBP (spontaneous bacterial peritonitis) ([place]) 11/25/2011  . Syncope and collapse 11/20/2011  . Abdominal pain 11/19/2011  . Hyponatremia 11/19/2011  . Anemia 11/19/2011  . Acute kidney injury superimposed on CKD ([place]) 11/19/2011  . Moderate protein-calorie malnutrition ([place]) 11/19/2011  . Essential hypertension 11/19/2011  . Hypothyroidism, acquired 11/19/2011  . Dyslipidemia 11/19/2011  . Diabetes ([place]) 07/15/2011    Orientation RESPIRATION BLADDER Height & Weight     Self, Time, Situation, Place  Normal Continent, External catheter (External Urinary Catheter) Weight: 216 lb 0.8 oz (98 kg) Height:  4\' 11"  (149.9 cm)  BEHAVIORAL SYMPTOMS/MOOD NEUROLOGICAL BOWEL NUTRITION STATUS      Continent Diet (See Discharge Summary)  AMBULATORY STATUS COMMUNICATION OF NEEDS Skin   Limited Assist Verbally Normal                       Personal Care Assistance Level of Assistance  Bathing, Feeding, Dressing Bathing Assistance: Limited assistance Feeding assistance: Independent (able to feed self;Cardiac Carb modified) Dressing Assistance: Limited assistance     Functional Limitations Info  Sight, Hearing, Speech Sight Info: Adequate Hearing Info: Adequate Speech Info: Adequate    SPECIAL CARE FACTORS FREQUENCY  PT (By licensed PT), OT (By licensed OT)     PT Frequency: 5x min weekly OT Frequency: 5x min weekly            Contractures Contractures Info: Not present    Additional Factors Info  Code Status, Allergies, Psychotropic, Insulin Sliding Scale Code Status Info: FULL Allergies Info: Amlodipine,Aspirin Psychotropic Info: escitalopram (LEXAPRO) tablet 20 mg daily  at bedtime,LORazepam (ATIVAN) tablet 0.25 mg 2 times daily with meals,LORazepam (ATIVAN) tablet 0.5 mg daily at bedtime Insulin Sliding Scale Info: insulin aspart (novoLOG) injection 0-15 Units 3 times daily with meals,insulin glargine  (LANTUS) injection 12 Units daily at bedtime       Current Medications (04/18/2020):  This is the current hospital active medication list Current Facility-Administered Medications  Medication Dose Route Frequency Provider Last Rate Last Admin  . albuterol (VENTOLIN HFA) 108 (90 Base) MCG/ACT inhaler 2 puff  2 puff Inhalation Q4H PRN Marcelyn Bruins, MD      . atorvastatin (LIPITOR) tablet 80 mg  80 mg Oral Daily Marcelyn Bruins, MD   80 mg at 04/17/20 0829  . azelastine (ASTELIN) 0.1 % nasal spray 1 spray  1 spray Each Nare BID Marcelyn Bruins, MD   1 spray at 04/17/20 2152  . diltiazem (CARDIZEM CD) 24 hr capsule 360 mg  360 mg Oral Daily Marcelyn Bruins, MD   360 mg at 04/17/20 0840  . enoxaparin (LOVENOX) injection 30 mg  30 mg Subcutaneous Daily Marcelyn Bruins, MD   30 mg at 04/17/20 0824  . escitalopram (LEXAPRO) tablet 20 mg  20 mg Oral QHS Marcelyn Bruins, MD   20 mg at 04/17/20 2150  . estradiol (ESTRACE) tablet 0.5 mg  0.5 mg Oral q morning - 10a Marcelyn Bruins, MD   0.5 mg at 04/17/20 0827  . ezetimibe (ZETIA) tablet 10 mg  10 mg Oral Daily Marcelyn Bruins, MD   10 mg at 04/17/20 0829  . fluticasone furoate-vilanterol (BREO ELLIPTA) 200-25 MCG/INH 1 puff  1 puff Inhalation Daily Marcelyn Bruins, MD   1 puff at 04/18/20 3267  . furosemide (LASIX) injection 40 mg  40 mg Intravenous BID Marcelyn Bruins, MD   40 mg at 04/17/20 1803  . guaiFENesin-dextromethorphan (ROBITUSSIN DM) 100-10 MG/5ML syrup 5 mL  5 mL Oral Q4H PRN Dessa Phi, DO   5 mL at 04/17/20 2206  . hydrALAZINE (APRESOLINE) tablet 25 mg  25 mg Oral Q8H Marcelyn Bruins, MD   25 mg at 04/18/20 0559  . insulin aspart (novoLOG) injection 0-15 Units  0-15 Units Subcutaneous TID WC Marcelyn Bruins, MD   2 Units at 04/18/20 0900  . insulin glargine (LANTUS) injection 12 Units  12 Units Subcutaneous QHS Marcelyn Bruins, MD   12 Units at 04/18/20 0915  . levothyroxine  (SYNTHROID) tablet 200 mcg  200 mcg Oral Q0600 Marcelyn Bruins, MD   200 mcg at 04/18/20 0559  . lip balm (CARMEX) ointment   Topical PRN Dessa Phi, DO      . LORazepam (ATIVAN) tablet 0.25 mg  0.25 mg Oral BID WC Marcelyn Bruins, MD   0.25 mg at 04/17/20 1803   And  . LORazepam (ATIVAN) tablet 0.5 mg  0.5 mg Oral QHS Marcelyn Bruins, MD   0.5 mg at 04/17/20 2150  . metoprolol succinate (TOPROL-XL) 24 hr tablet 100 mg  100 mg Oral q morning - 10a Marcelyn Bruins, MD   100 mg at 04/17/20 0839  . montelukast (SINGULAIR) tablet 10 mg  10 mg Oral QHS Marcelyn Bruins, MD   10 mg at 04/17/20 2154  . multivitamin with minerals tablet 1 tablet  1 tablet Oral Daily Marcelyn Bruins, MD   1 tablet at 04/17/20 [phone]  . ondansetron (ZOFRAN-ODT) disintegrating tablet 4 mg  4 mg Oral Q8H PRN Neva Seat  B, MD      . paliperidone (INVEGA) 24 hr tablet 3 mg  3 mg Oral QHS Marcelyn Bruins, MD   3 mg at 04/17/20 2154  . pantoprazole (PROTONIX) EC tablet 40 mg  40 mg Oral Daily Marcelyn Bruins, MD   40 mg at 04/17/20 0827  . polyethylene glycol (MIRALAX / GLYCOLAX) packet 17 g  17 g Oral Daily PRN Dessa Phi, DO      . polyvinyl alcohol (LIQUIFILM TEARS) 1.4 % ophthalmic solution 1 drop  1 drop Both Eyes Daily PRN Marcelyn Bruins, MD      . saccharomyces boulardii (FLORASTOR) capsule 250 mg  250 mg Oral BID Marcelyn Bruins, MD   250 mg at 04/17/20 2150  . sodium chloride flush (NS) 0.9 % injection 3 mL  3 mL Intravenous Q12H Marcelyn Bruins, MD   3 mL at 04/17/20 2154  . sucralfate (CARAFATE) tablet 1 g  1 g Oral BID WC Marcelyn Bruins, MD   1 g at 04/17/20 1803     Discharge Medications: Please see discharge summary for a list of discharge medications.  Relevant Imaging Results:  Relevant Lab Results:   Additional Information [id number]  Trula Ore, LCSWA

## 2020-04-18 NOTE — Progress Notes (Signed)
PROGRESS NOTE    Amanda Cain  TIW:[id number] DOB: Jan 28, 1946 DOA: 04/16/2020 PCP: Mayra Neer, MD     Brief Narrative:  Amanda Cain is a 74 year old female with past medical history significant for chronic diastolic heart failure, CKD stage 4, type 2 diabetes, hyperlipidemia, hypertension, GERD, hypothyroidism, pulmonary hypertension, obesity who presents with weakness, edema and dyspnea on exertion.  This has been going on for several days.  She also has had couple falls in the last day.  In the emergency department, she was found to have changes consistent with exacerbation of her diastolic heart failure.  New events last 24 hours / Subjective: She states that she is feeling very weak still, unable to ambulate well  Assessment & Plan:   Principal Problem:   Acute exacerbation of CHF (congestive heart failure) ([place]) Active Problems:   Diabetes ([place])   Acute kidney injury superimposed on CKD ([place])   Essential hypertension   Dyslipidemia   Asthma   Physical deconditioning   Acute on chronic diastolic heart failure -Continue IV Lasix, Cardizem, Toprol.  Resume home Demadex tomorrow -Strict I's and O's, daily weight  CKD stage IV -Baseline creatinine 2.6-2.9 -Stable  Demand ischemia -Without complaints of chest pain.  Troponin has been flat 21 --> 21  Hypokalemia -Replace, trend  Hypertension -Continue hydralazine, Lasix, Cardizem, metoprolol  Weakness -PT OT recommending SNF placement  Asthma -Continue Singulair, Breo, albuterol  Diabetes mellitus type 2 -Continue Lantus, sliding scale insulin  Hyperlipidemia -Continue lipitor, Zetia  GERD -Continue PPI  Hypothyroidism -Continue Synthroid  Depression/anxiety -Continue Lexapro, Ativan    DVT prophylaxis:  enoxaparin (LOVENOX) injection 30 mg Start: 04/17/20 1000  Code Status: Full code Family Communication: Sister at bedside Disposition Plan:  Status is: Inpatient  Remains  inpatient appropriate because:Unsafe d/c plan and IV treatments appropriate due to intensity of illness or inability to take PO   Dispo: The patient is from: Home              Anticipated d/c is to: SNF              Anticipated d/c date is: 1 day              Patient currently is not medically stable to d/c.  IV Lasix 1 more day.  SNF placement in process        Consultants:   None  Procedures:   None  Antimicrobials:  Anti-infectives (From admission, onward)   None       Objective: Vitals:   04/17/20 2350 04/18/20 0300 04/18/20 0927 04/18/20 1044  BP: (!) 158/66 (!) 157/63  (!) 157/54  Pulse: 66 63  71  Resp: 20 (!) 21    Temp: 97.7 F (36.5 C) 98 F (36.7 C)    TempSrc: Oral Oral    SpO2: 98% 100% 95%   Weight:  98 kg    Height:        Intake/Output Summary (Last 24 hours) at 04/18/2020 1255 Last data filed at 04/18/2020 0655 Gross per 24 hour  Intake 240 ml  Output --  Net 240 ml   Filed Weights   04/17/20 0205 04/17/20 0417 04/18/20 0300  Weight: 95.9 kg 95.9 kg 98 kg    Examination: General exam: Appears calm and comfortable  Respiratory system: Clear to auscultation. Respiratory effort normal. Cardiovascular system: S1 & S2 heard, RRR.  Nonpitting pedal edema. Gastrointestinal system: Abdomen is nondistended, soft and nontender. Normal bowel sounds heard. Central nervous system:  Alert and oriented. Non focal exam. Speech clear  Extremities: Symmetric in appearance bilaterally  Skin: No rashes, lesions or ulcers on exposed skin  Psychiatry: Judgement and insight appear stable. Mood & affect appropriate.    Data Reviewed: I have personally reviewed following labs and imaging studies  CBC: Recent Labs  Lab 04/16/20 1719 04/17/20 0513  WBC 10.7* 9.0  NEUTROABS 8.7*  --   HGB 11.5* 11.2*  HCT 37.2 35.5*  MCV 88.6 87.4  PLT 300 841   Basic Metabolic Panel: Recent Labs  Lab 04/16/20 1719 04/17/20 0513 04/18/20 0231  NA 134* 136 135   K 3.9 3.2* 4.5  CL 94* 98 98  CO2 29 30 27   GLUCOSE 128* 152* 146*  BUN 64* 59* 59*  CREATININE 3.01* 2.86* 2.84*  CALCIUM 9.5 9.0 8.9   GFR: Estimated Creatinine Clearance: 17.3 mL/min (A) (by C-G formula based on SCr of 2.84 mg/dL (H)). Liver Function Tests: Recent Labs  Lab 04/16/20 1719  AST 22  ALT 28  ALKPHOS 65  BILITOT 0.6  PROT 6.3*  ALBUMIN 3.2*   No results for input(s): LIPASE, AMYLASE in the last 168 hours. No results for input(s): AMMONIA in the last 168 hours. Coagulation Profile: No results for input(s): INR, PROTIME in the last 168 hours. Cardiac Enzymes: No results for input(s): CKTOTAL, CKMB, CKMBINDEX, TROPONINI in the last 168 hours. BNP (last 3 results) No results for input(s): PROBNP in the last 8760 hours. HbA1C: No results for input(s): HGBA1C in the last 72 hours. CBG: Recent Labs  Lab 04/17/20 1220 04/17/20 1600 04/17/20 2114 04/18/20 0751 04/18/20 1124  GLUCAP 106* 184* 77 124* 157*   Lipid Profile: No results for input(s): CHOL, HDL, LDLCALC, TRIG, CHOLHDL, LDLDIRECT in the last 72 hours. Thyroid Function Tests: No results for input(s): TSH, T4TOTAL, FREET4, T3FREE, THYROIDAB in the last 72 hours. Anemia Panel: No results for input(s): VITAMINB12, FOLATE, FERRITIN, TIBC, IRON, RETICCTPCT in the last 72 hours. Sepsis Labs: No results for input(s): PROCALCITON, LATICACIDVEN in the last 168 hours.  Recent Results (from the past 240 hour(s))  Respiratory Panel by RT PCR (Flu A&B, Covid) - Nasopharyngeal Swab     Status: None   Collection Time: 04/16/20  5:54 PM   Specimen: Nasopharyngeal Swab; Nasopharyngeal(NP) swabs in vial transport medium  Result Value Ref Range Status   SARS Coronavirus 2 by RT PCR NEGATIVE NEGATIVE Final    Comment: (NOTE) SARS-CoV-2 target nucleic acids are NOT DETECTED.  The SARS-CoV-2 RNA is generally detectable in upper respiratoy specimens during the acute phase of infection. The lowest concentration  of SARS-CoV-2 viral copies this assay can detect is 131 copies/mL. A negative result does not preclude SARS-Cov-2 infection and should not be used as the sole basis for treatment or other patient management decisions. A negative result may occur with  improper specimen collection/handling, submission of specimen other than nasopharyngeal swab, presence of viral mutation(s) within the areas targeted by this assay, and inadequate number of viral copies (<131 copies/mL). A negative result must be combined with clinical observations, patient history, and epidemiological information. The expected result is Negative.  Fact Sheet for Patients:  PinkCheek.be  Fact Sheet for Healthcare Providers:  GravelBags.it  This test is no t yet approved or cleared by the [place] FDA and  has been authorized for detection and/or diagnosis of SARS-CoV-2 by FDA under an Emergency Use Authorization (EUA). This EUA will remain  in effect (meaning this test can be used) for the  duration of the COVID-19 declaration under Section 564(b)(1) of the Act, 21 U.S.C. section 360bbb-3(b)(1), unless the authorization is terminated or revoked sooner.     Influenza A by PCR NEGATIVE NEGATIVE Final   Influenza B by PCR NEGATIVE NEGATIVE Final    Comment: (NOTE) The Xpert Xpress SARS-CoV-2/FLU/RSV assay is intended as an aid in  the diagnosis of influenza from Nasopharyngeal swab specimens and  should not be used as a sole basis for treatment. Nasal washings and  aspirates are unacceptable for Xpert Xpress SARS-CoV-2/FLU/RSV  testing.  Fact Sheet for Patients: PinkCheek.be  Fact Sheet for Healthcare Providers: GravelBags.it  This test is not yet approved or cleared by the [place] FDA and  has been authorized for detection and/or diagnosis of SARS-CoV-2 by  FDA under an Emergency Use  Authorization (EUA). This EUA will remain  in effect (meaning this test can be used) for the duration of the  Covid-19 declaration under Section 564(b)(1) of the Act, 21  U.S.C. section 360bbb-3(b)(1), unless the authorization is  terminated or revoked. Performed at [facility], [place] [address], [place], [place] [address]       Radiology Studies: DG Chest Portable 1 View  Result Date: 04/16/2020 CLINICAL DATA:  Shortness of breath EXAM: PORTABLE CHEST 1 VIEW COMPARISON:  February 20, 2020 FINDINGS: There is scarring in the left mid lung. The lungs elsewhere are clear. Heart is mildly enlarged with pulmonary vascularity normal. No adenopathy. There are surgical clips in the right supraclavicular region, stable. IMPRESSION: Scarring left mid lung. No edema or airspace opacity. Stable cardiomegaly. No adenopathy. Electronically Signed   By: Lowella Grip III M.D.   On: 04/16/2020 18:07      Scheduled Meds: . atorvastatin  80 mg Oral Daily  . azelastine  1 spray Each Nare BID  . diltiazem  360 mg Oral Daily  . enoxaparin (LOVENOX) injection  30 mg Subcutaneous Daily  . escitalopram  20 mg Oral QHS  . estradiol  0.5 mg Oral q morning - 10a  . ezetimibe  10 mg Oral Daily  . fluticasone furoate-vilanterol  1 puff Inhalation Daily  . furosemide  40 mg Intravenous BID  . hydrALAZINE  25 mg Oral Q8H  . insulin aspart  0-15 Units Subcutaneous TID WC  . insulin glargine  12 Units Subcutaneous QHS  . levothyroxine  200 mcg Oral Q0600  . LORazepam  0.25 mg Oral BID WC   And  . LORazepam  0.5 mg Oral QHS  . metoprolol succinate  100 mg Oral q morning - 10a  . montelukast  10 mg Oral QHS  . multivitamin with minerals  1 tablet Oral Daily  . paliperidone  3 mg Oral QHS  . pantoprazole  40 mg Oral Daily  . saccharomyces boulardii  250 mg Oral BID  . sodium chloride flush  3 mL Intravenous Q12H  . sucralfate  1 g Oral BID WC   Continuous Infusions:   LOS: 1 day      Time  spent: 20 minutes   Dessa Phi, DO [facility] 04/18/2020, 12:55 PM   Available via Epic secure chat 7am-7pm After these hours, please refer to coverage provider listed on [email or web address]

## 2020-04-19 LAB — BASIC METABOLIC PANEL
Anion gap: 10 (ref 5–15)
BUN: 54 mg/dL — ABNORMAL HIGH (ref 8–23)
CO2: 27 mmol/L (ref 22–32)
Calcium: 8.8 mg/dL — ABNORMAL LOW (ref 8.9–10.3)
Chloride: 99 mmol/L (ref 98–111)
Creatinine, Ser: 2.86 mg/dL — ABNORMAL HIGH (ref 0.44–1.00)
GFR, Estimated: 17 mL/min — ABNORMAL LOW (ref >60)
Glucose, Bld: 94 mg/dL (ref 70–99)
Potassium: 3.7 mmol/L (ref 3.5–5.1)
Sodium: 136 mmol/L (ref 135–145)

## 2020-04-19 LAB — GLUCOSE, CAPILLARY
Glucose-Capillary: 158 mg/dL — ABNORMAL HIGH (ref 70–99)
Glucose-Capillary: 185 mg/dL — ABNORMAL HIGH (ref 70–99)
Glucose-Capillary: 153 mg/dL — ABNORMAL HIGH (ref 70–99)
Glucose-Capillary: 150 mg/dL — ABNORMAL HIGH (ref 70–99)

## 2020-04-19 MED ORDER — ACETAMINOPHEN 325 MG PO TABS
650.0000 mg | ORAL_TABLET | Freq: Four times a day (QID) | ORAL | Status: DC | PRN
  Administered 2020-04-19 – 2020-04-21 (×2): 650 mg via ORAL
  Filled 2020-04-19 (×2): qty 2

## 2020-04-19 NOTE — Progress Notes (Signed)
PROGRESS NOTE    Amanda Cain  YCX:[id number] DOB: [birth year] DOA: 04/16/2020 PCP: Mayra Neer, MD     Brief Narrative:  Amanda Cain is a 74 year old female with past medical history significant for chronic diastolic heart failure, CKD stage 4, type 2 diabetes, hyperlipidemia, hypertension, GERD, hypothyroidism, pulmonary hypertension, obesity who presents with weakness, edema and dyspnea on exertion.  This has been going on for several days.  She also has had couple falls in the last day.  In the emergency department, she was found to have changes consistent with exacerbation of her diastolic heart failure.  New events last 24 hours / Subjective: States she feels better overall. No new complaints, asking for eye drops   Assessment & Plan:   Principal Problem:   Acute exacerbation of CHF (congestive heart failure) (HCC) Active Problems:   Diabetes ([place])   Acute kidney injury superimposed on CKD ([place])   Essential hypertension   Dyslipidemia   Asthma   Physical deconditioning   Acute on chronic diastolic heart failure -Continue Cardizem, Toprol.  Resume home Demadex  -Strict I's and O's, daily weight -Improved and stable on room air   CKD stage IV -Baseline creatinine 2.6-2.9 -Stable  Demand ischemia -Without complaints of chest pain.  Troponin has been flat 21 --> 21  Hypertension -Continue hydralazine, demadex, Cardizem, metoprolol  Weakness -PT OT recommending SNF placement  Asthma -Continue Singulair, Breo, albuterol  Diabetes mellitus type 2 -Continue Lantus, sliding scale insulin  Hyperlipidemia -Continue lipitor, Zetia  GERD -Continue PPI  Hypothyroidism -Continue Synthroid  Depression/anxiety -Continue Lexapro, Ativan    DVT prophylaxis:  enoxaparin (LOVENOX) injection 30 mg Start: 04/17/20 1000  Code Status: Full code Family Communication: No family at bedside Disposition Plan:  Status is: Inpatient  Remains inpatient  appropriate because:Unsafe d/c plan   Dispo: The patient is from: Home              Anticipated d/c is to: SNF              Anticipated d/c date is: 1 day              Patient currently is medically stable to d/c.  DC when SNF placement found and insurance auth complete    Consultants:   None  Procedures:   None  Antimicrobials:  Anti-infectives (From admission, onward)   None       Objective: Vitals:   04/18/20 2358 04/19/20 0526 04/19/20 0912 04/19/20 0915  BP: (!) 158/62 136/62    Pulse: 65 (!) 58 61   Resp: 15 20 19 19   Temp: 97.6 F (36.4 C) 97.8 F (36.6 C)    TempSrc: Oral Axillary    SpO2: 99% 98% 94%   Weight:  97.1 kg    Height:        Intake/Output Summary (Last 24 hours) at 04/19/2020 0931 Last data filed at 04/19/2020 0747 Gross per 24 hour  Intake 240 ml  Output 250 ml  Net -10 ml   Filed Weights   04/17/20 0417 04/18/20 0300 04/19/20 0526  Weight: 95.9 kg 98 kg 97.1 kg    Examination: General exam: Appears calm and comfortable  Respiratory system: Clear to auscultation. Respiratory effort normal. On room air.  Cardiovascular system: S1 & S2 heard, RRR.  Gastrointestinal system: Abdomen is nondistended, soft and nontender. Normal bowel sounds heard. Central nervous system: Alert and oriented. Non focal exam. Speech clear  Extremities: Symmetric in appearance bilaterally  Skin: No rashes,  lesions or ulcers on exposed skin  Psychiatry: Judgement and insight appear stable. Mood & affect appropriate.    Data Reviewed: I have personally reviewed following labs and imaging studies  CBC: Recent Labs  Lab 04/16/20 1719 04/17/20 0513  WBC 10.7* 9.0  NEUTROABS 8.7*  --   HGB 11.5* 11.2*  HCT 37.2 35.5*  MCV 88.6 87.4  PLT 300 287   Basic Metabolic Panel: Recent Labs  Lab 04/16/20 1719 04/17/20 0513 04/18/20 0231 04/19/20 0240  NA 134* 136 135 136  K 3.9 3.2* 4.5 3.7  CL 94* 98 98 99  CO2 29 30 27 27   GLUCOSE 128* 152* 146* 94   BUN 64* 59* 59* 54*  CREATININE 3.01* 2.86* 2.84* 2.86*  CALCIUM 9.5 9.0 8.9 8.8*   GFR: Estimated Creatinine Clearance: 17.1 mL/min (A) (by C-G formula based on SCr of 2.86 mg/dL (H)). Liver Function Tests: Recent Labs  Lab 04/16/20 1719  AST 22  ALT 28  ALKPHOS 65  BILITOT 0.6  PROT 6.3*  ALBUMIN 3.2*   No results for input(s): LIPASE, AMYLASE in the last 168 hours. No results for input(s): AMMONIA in the last 168 hours. Coagulation Profile: No results for input(s): INR, PROTIME in the last 168 hours. Cardiac Enzymes: No results for input(s): CKTOTAL, CKMB, CKMBINDEX, TROPONINI in the last 168 hours. BNP (last 3 results) No results for input(s): PROBNP in the last 8760 hours. HbA1C: No results for input(s): HGBA1C in the last 72 hours. CBG: Recent Labs  Lab 04/18/20 0751 04/18/20 1124 04/18/20 1636 04/18/20 2120 04/19/20 0756  GLUCAP 124* 157* 151* 114* 158*   Lipid Profile: No results for input(s): CHOL, HDL, LDLCALC, TRIG, CHOLHDL, LDLDIRECT in the last 72 hours. Thyroid Function Tests: No results for input(s): TSH, T4TOTAL, FREET4, T3FREE, THYROIDAB in the last 72 hours. Anemia Panel: No results for input(s): VITAMINB12, FOLATE, FERRITIN, TIBC, IRON, RETICCTPCT in the last 72 hours. Sepsis Labs: No results for input(s): PROCALCITON, LATICACIDVEN in the last 168 hours.  Recent Results (from the past 240 hour(s))  Respiratory Panel by RT PCR (Flu A&B, Covid) - Nasopharyngeal Swab     Status: None   Collection Time: 04/16/20  5:54 PM   Specimen: Nasopharyngeal Swab; Nasopharyngeal(NP) swabs in vial transport medium  Result Value Ref Range Status   SARS Coronavirus 2 by RT PCR NEGATIVE NEGATIVE Final    Comment: (NOTE) SARS-CoV-2 target nucleic acids are NOT DETECTED.  The SARS-CoV-2 RNA is generally detectable in upper respiratoy specimens during the acute phase of infection. The lowest concentration of SARS-CoV-2 viral copies this assay can detect is 131  copies/mL. A negative result does not preclude SARS-Cov-2 infection and should not be used as the sole basis for treatment or other patient management decisions. A negative result may occur with  improper specimen collection/handling, submission of specimen other than nasopharyngeal swab, presence of viral mutation(s) within the areas targeted by this assay, and inadequate number of viral copies (<131 copies/mL). A negative result must be combined with clinical observations, patient history, and epidemiological information. The expected result is Negative.  Fact Sheet for Patients:  PinkCheek.be  Fact Sheet for Healthcare Providers:  GravelBags.it  This test is no t yet approved or cleared by the [place] FDA and  has been authorized for detection and/or diagnosis of SARS-CoV-2 by FDA under an Emergency Use Authorization (EUA). This EUA will remain  in effect (meaning this test can be used) for the duration of the COVID-19 declaration under Section 564(b)(1)  of the Act, 21 U.S.C. section 360bbb-3(b)(1), unless the authorization is terminated or revoked sooner.     Influenza A by PCR NEGATIVE NEGATIVE Final   Influenza B by PCR NEGATIVE NEGATIVE Final    Comment: (NOTE) The Xpert Xpress SARS-CoV-2/FLU/RSV assay is intended as an aid in  the diagnosis of influenza from Nasopharyngeal swab specimens and  should not be used as a sole basis for treatment. Nasal washings and  aspirates are unacceptable for Xpert Xpress SARS-CoV-2/FLU/RSV  testing.  Fact Sheet for Patients: PinkCheek.be  Fact Sheet for Healthcare Providers: GravelBags.it  This test is not yet approved or cleared by the [place] FDA and  has been authorized for detection and/or diagnosis of SARS-CoV-2 by  FDA under an Emergency Use Authorization (EUA). This EUA will remain  in effect (meaning  this test can be used) for the duration of the  Covid-19 declaration under Section 564(b)(1) of the Act, 21  U.S.C. section 360bbb-3(b)(1), unless the authorization is  terminated or revoked. Performed at [facility], [place] [address] [place], [place] [address]       Radiology Studies: No results found.    Scheduled Meds: . atorvastatin  80 mg Oral Daily  . azelastine  1 spray Each Nare BID  . diltiazem  360 mg Oral Daily  . enoxaparin (LOVENOX) injection  30 mg Subcutaneous Daily  . escitalopram  20 mg Oral QHS  . estradiol  0.5 mg Oral q morning - 10a  . ezetimibe  10 mg Oral Daily  . fluticasone furoate-vilanterol  1 puff Inhalation Daily  . hydrALAZINE  25 mg Oral Q8H  . insulin aspart  0-15 Units Subcutaneous TID WC  . insulin glargine  12 Units Subcutaneous QHS  . levothyroxine  200 mcg Oral Q0600  . LORazepam  0.25 mg Oral BID WC   And  . LORazepam  0.5 mg Oral QHS  . metoprolol succinate  100 mg Oral q morning - 10a  . montelukast  10 mg Oral QHS  . multivitamin with minerals  1 tablet Oral Daily  . paliperidone  3 mg Oral QHS  . pantoprazole  40 mg Oral Daily  . saccharomyces boulardii  250 mg Oral BID  . sodium chloride flush  3 mL Intravenous Q12H  . sucralfate  1 g Oral BID WC  . torsemide  100 mg Oral Daily   Continuous Infusions:   LOS: 2 days      Time spent: 20 minutes   Dessa Phi, DO [facility] 04/19/2020, 9:31 AM   Available via Epic secure chat 7am-7pm After these hours, please refer to coverage provider listed on [email or web address]

## 2020-04-19 NOTE — TOC Progression Note (Addendum)
Transition of Care [facility]) - Progression Note    Patient Details  Name: Amanda Cain MRN: [id number] Date of Birth: 01-14-1946  Transition of Care [facility]) CM/SW [place], [place] Phone Number: [phone] 04/19/2020, 12:08 PM  Clinical Narrative:     CSW spoke with patient about the 2 bed offers that have come back. Patient asked CSW where [facility] was located.CSW informed patient that she would reach out to [facility] to see if they had bed availability. CSW was informed that [facility] could accept patient tomorrow.  CSW called patient back and updated her that [facility] could accept.  CSW received a call from RN stating that patient does not want to go [facility] and really wants to go to [facility] which has not made an offer. [place] to see if they can review referral. RN stated that patient would also go to [place] however they do not do admissions on the weekend.  Appears authorization has been approved however facility will need to be given.  TOC will have to follow up tomorrow about placement options.     Expected Discharge Plan: [place] Barriers to Discharge: Continued Medical Work up  Expected Discharge Plan and Services Expected Discharge Plan: [place] arrangements for the past 2 months: Single Family Home                                       Social Determinants of Health (SDOH) Interventions    Readmission Risk Interventions Readmission Risk Prevention Plan 02/26/2020  Transportation Screening Complete  Medication Review ([place]) Complete  PCP or Specialist appointment within 3-5 days of discharge Complete  SW Recovery Care/Counseling Consult Complete  [place] Patient Refused  Some recent data might be hidden

## 2020-04-19 NOTE — Progress Notes (Signed)
Occupational Therapy Treatment Patient Details Name: Winry Egnew MRN: [id number] DOB: 02-Sep-1945 Today's Date: 04/19/2020    History of present illness 74 yo female presenting with weakness, edema, and dyspnea. Also s/p several falls at home. PMH including chronic diastolic heart failure, CKD stage 4, type 2 diabetes, hyperlipidemia, hypertension, GERD, hypothyroidism, pulmonary hypertension, obesity.   OT comments  Pt. Seen for skilled OT treatment session.  Completing toileting pericare with min a for thoroughness.  Moving with min guard a in room with rw.  Cues for rw management and safety, also required cues for safe transfers and hand placement before sitting down.  Will cont. To progress with ADLS next session.   Follow Up Recommendations  Home health OT;Supervision - Intermittent    Equipment Recommendations  None recommended by OT    Recommendations for Other Services      Precautions / Restrictions Precautions Precautions: Fall Precaution Comments: monitor telemetry values-pt. had been taken off of telemetry for toileting-was in b.room upon arrival to room Restrictions Weight Bearing Restrictions: No       Mobility Bed Mobility               General bed mobility comments: seated on toilet upon arrival into room-seated in recliner at end of session  Transfers Overall transfer level: Needs assistance Equipment used: Rolling walker (2 wheeled) Transfers: Sit to/from [facility] Sit to Stand: Min guard Stand pivot transfers: Min assist       General transfer comment: cues during pivot transfer to recliner. pt. not completing turn prior to attemtps to sit, was also not back far enough.  reviewed need to feel both legs touching the chair and reaching back for arm rests prior to sitting down    Balance                                           ADL either performed or assessed with clinical judgement   ADL Overall ADL's  : Needs assistance/impaired     Grooming: Wash/dry hands;Standing Grooming Details (indicate cue type and reason): cues for rw management at counter                 Toilet Transfer: Supervision/safety;Ambulation;Regular Toilet;RW;Grab bars   Toileting- Clothing Manipulation and Hygiene: Minimal assistance;Sit to/from stand Toileting - Clothing Manipulation Details (indicate cue type and reason): pt. states "im just not getting it" after multiple swipes. offered warm wet washcloth, pt. still requested assistance after several more attempts. reviewed benefits of wet flushable t.p. for home use is this cont. to be difficult for her     Functional mobility during ADLs: Min guard;Rolling walker General ADL Comments: moving with s/min guard a.  cues for rw management as she attempted to push it to the side when approaching the sink.  educated on safety of staying inside the walker vs. to the side. she verbalized understanding     Vision       Perception     Praxis      Cognition Arousal/Alertness: Awake/alert Behavior During Therapy: WFL for tasks assessed/performed Overall Cognitive Status: Within Functional Limits for tasks assessed                                          Exercises  Shoulder Instructions       General Comments      Pertinent Vitals/ Pain       Pain Assessment: No/denies pain  Home Living                                          Prior Functioning/Environment              Frequency  Min 3X/week        Progress Toward Goals  OT Goals(current goals can now be found in the care plan section)  Progress towards OT goals: Progressing toward goals     Plan      Co-evaluation                 AM-PAC OT "6 Clicks" Daily Activity     Outcome Measure   Help from another person eating meals?: A Little Help from another person taking care of personal grooming?: A Little Help from another person  toileting, which includes using toliet, bedpan, or urinal?: A Little Help from another person bathing (including washing, rinsing, drying)?: A Little Help from another person to put on and taking off regular upper body clothing?: A Little Help from another person to put on and taking off regular lower body clothing?: A Little 6 Click Score: 18    End of Session Equipment Utilized During Treatment: Rolling walker  OT Visit Diagnosis: Unsteadiness on feet (R26.81);Other abnormalities of gait and mobility (R26.89);Muscle weakness (generalized) (M62.81)   Activity Tolerance Patient tolerated treatment well   Patient Left in chair;with call bell/phone within reach, placed back on telemetry    Nurse Communication Other (comment) (reviewed with RN that pt. had, had a BM for doc. purposes)        Time: 9458-5929 OT Time Calculation (min): 13 min  Charges: OT General Charges $OT Visit: 1 Visit OT Treatments $Self Care/Home Management : 8-22 mins  Sonia Baller, COTA/L Acute Rehabilitation [phone]   Janice Coffin 04/19/2020, 11:19 AM

## 2020-04-20 LAB — SARS CORONAVIRUS 2 BY RT PCR (HOSPITAL ORDER, PERFORMED IN ~~LOC~~ HOSPITAL LAB): SARS Coronavirus 2: NEGATIVE

## 2020-04-20 LAB — BASIC METABOLIC PANEL
Anion gap: 9 (ref 5–15)
BUN: 52 mg/dL — ABNORMAL HIGH (ref 8–23)
CO2: 28 mmol/L (ref 22–32)
Calcium: 8.8 mg/dL — ABNORMAL LOW (ref 8.9–10.3)
Chloride: 97 mmol/L — ABNORMAL LOW (ref 98–111)
Creatinine, Ser: 2.76 mg/dL — ABNORMAL HIGH (ref 0.44–1.00)
GFR, Estimated: 17 mL/min — ABNORMAL LOW (ref >60)
Glucose, Bld: 170 mg/dL — ABNORMAL HIGH (ref 70–99)
Potassium: 4.3 mmol/L (ref 3.5–5.1)
Sodium: 134 mmol/L — ABNORMAL LOW (ref 135–145)

## 2020-04-20 LAB — GLUCOSE, CAPILLARY
Glucose-Capillary: 149 mg/dL — ABNORMAL HIGH (ref 70–99)
Glucose-Capillary: 151 mg/dL — ABNORMAL HIGH (ref 70–99)
Glucose-Capillary: 109 mg/dL — ABNORMAL HIGH (ref 70–99)
Glucose-Capillary: 181 mg/dL — ABNORMAL HIGH (ref 70–99)

## 2020-04-20 MED ORDER — LORAZEPAM 0.5 MG PO TABS
0.2500 mg | ORAL_TABLET | ORAL | 0 refills | Status: DC
Start: 2020-04-20 — End: 2020-10-01

## 2020-04-20 NOTE — TOC Progression Note (Addendum)
Transition of Care [facility]) - Progression Note    Patient Details  Name: Amanda Cain MRN: [id number] Date of Birth: 1946-01-04  Transition of Care [facility]) CM/SW [place], [place] Phone Number: 04/20/2020, 12:29 PM  Clinical Narrative:     Update 11/22 1:56pm- CSW received message from [place] with [facility] they cannot accept patient today for SNF placement. CSW spoke with patient. Patient agreed to SNF placement with [facility] who can accept patient tomorrow. CSW called and updated insurance. Insurance authorization has been approved. [employer] ID# [id number]. Start date is 11/23. Next review date is 11/29.  Patient has SNF bed at [facility]. Insurance authorization has been approved.  CSW will continue to follow.  CSW awaiting callback from [place] to see if they can accept patient for DC today.CSW confirmed with [facility] place and they can accept pateint tomorrow for SNF placement. CSW spoke with [name] patients sister and she said patient is willing to go to [place] or [facility]. MD informed.  CSW will continue to follow.   Expected Discharge Plan: [place] Barriers to Discharge: Continued Medical Work up  Expected Discharge Plan and Services Expected Discharge Plan: [place] arrangements for the past 2 months: Single Family Home Expected Discharge Date: 04/20/20                                     Social Determinants of Health (SDOH) Interventions    Readmission Risk Interventions Readmission Risk Prevention Plan 02/26/2020  Transportation Screening Complete  Medication Review [place]) Complete  PCP or Specialist appointment within 3-5 days of discharge Complete  SW Recovery Care/Counseling Consult Complete  [place] Patient Refused  Some recent data might be hidden

## 2020-04-20 NOTE — Progress Notes (Signed)
  Mobility Specialist Criteria Algorithm Info.  Mobility Team: [facility] elevated:Self regulated Activity: Ambulated in hall;Ambulated to bathroom (In chair before and after ambulation) Range of motion: Active;All extremities Level of assistance: Standby assist, set-up cues, supervision of patient - no hands on Assistive device: Front wheel walker Minutes sitting in chair:  Minutes stood: 5 minutes Minutes ambulated: 5 minutes Distance ambulated (ft): 60 ft Mobility response: Tolerated well (Required standing rest break x4) Bed Position: Chair  Pt eager to participate in mobility. Ambulated in hallway 60 feet with supervision requiring standing rest breaks x4. Declined dizziness, lightheadedness, and SOB. Tolerated ambulation well without any complaints and is now sitting in recliner chair with all needs met.     04/20/2020 3:11 PM

## 2020-04-20 NOTE — Progress Notes (Signed)
Physical Therapy Treatment Patient Details Name: Amanda Cain MRN: [id number] DOB: May 01, 1946 Today's Date: 04/20/2020    History of Present Illness 74 yo female presenting with weakness, edema, and dyspnea. Also s/p several falls at home. PMH including chronic diastolic heart failure, CKD stage 4, type 2 diabetes, hyperlipidemia, hypertension, GERD, hypothyroidism, pulmonary hypertension, obesity.    PT Comments    Pt seated in recliner. Focused on LE strengthening.  Pt fatigues quickly and required AAROM to complete exercises.  Pt continues to benefit from skilled rehab in a post acute setting to maximize functional gains before returning home.     Follow Up Recommendations  SNF     Equipment Recommendations   (bariatric rollator.)    Recommendations for Other Services       Precautions / Restrictions Precautions Precautions: Fall    Mobility  Bed Mobility               General bed mobility comments: Pt seated in recliner.  Transfers Overall transfer level: Needs assistance Equipment used: Rolling walker (2 wheeled) Transfers: Sit to/from Stand Sit to Stand: Min guard         General transfer comment: Cues for hand placement to and from seated surface.  Once in recliner boosted with bed pad to back of recliner chair.  Ambulation/Gait Ambulation/Gait assistance:  (NT- deferred to focus on LE exercises.)               Stairs             Wheelchair Mobility    Modified [place] (Stroke Patients Only)       Balance     Sitting balance-Leahy Scale: Fair       Standing balance-Leahy Scale: Fair (static)                              Cognition Arousal/Alertness: Awake/alert Behavior During Therapy: WFL for tasks assessed/performed Overall Cognitive Status: Within Functional Limits for tasks assessed                                        Exercises General Exercises - Lower Extremity Ankle  Circles/Pumps: AROM;Both;20 reps;Supine Quad Sets: AROM;Both;10 reps;Supine Long Arc Quad: AROM;Both;10 reps;Supine;AAROM Heel Slides: AROM;Both;10 reps;Supine;AAROM Hip ABduction/ADduction: AROM;Both;10 reps;AAROM Straight Leg Raises: AROM;AAROM;Both;10 reps;Supine Hip Flexion/Marching: AROM;Both;10 reps;Supine    General Comments        Pertinent Vitals/Pain Pain Assessment: No/denies pain    Home Living                      Prior Function            PT Goals (current goals can now be found in the care plan section) Acute Rehab PT Goals Patient Stated Goal: to be stronger Potential to Achieve Goals: Good Progress towards PT goals: Progressing toward goals    Frequency    Min 3X/week      PT Plan Current plan remains appropriate    Co-evaluation              AM-PAC PT "6 Clicks" Mobility   Outcome Measure  Help needed turning from your back to your side while in a flat bed without using bedrails?: A Little Help needed moving from lying on your back to sitting on the side of a flat bed without  using bedrails?: A Little Help needed moving to and from a bed to a chair (including a wheelchair)?: A Little Help needed standing up from a chair using your arms (e.g., wheelchair or bedside chair)?: A Little Help needed to walk in hospital room?: A Little Help needed climbing 3-5 steps with a railing? : A Little 6 Click Score: 18    End of Session   Activity Tolerance: Patient tolerated treatment well;Treatment limited secondary to medical complications (Comment);Patient limited by fatigue Patient left: in chair;with call bell/phone within reach Nurse Communication: Mobility status PT Visit Diagnosis: Unsteadiness on feet (R26.81);Muscle weakness (generalized) (M62.81);Difficulty in walking, not elsewhere classified (R26.2)     Time: 4628-6381 PT Time Calculation (min) (ACUTE ONLY): 15 min  Charges:  $Therapeutic Exercise: 8-22 mins                      Erasmo Leventhal , PTA Acute Rehabilitation Services Pager [phone] Office [phone]     Laranda Burkemper Eli Hose 04/20/2020, 5:16 PM

## 2020-04-20 NOTE — Discharge Summary (Addendum)
Physician Discharge Summary  Amanda Cain XFG:[id number] DOB: 09-22-1945 DOA: 04/16/2020  PCP: Mayra Neer, MD  Admit date: 04/16/2020 Discharge date: 04/21/2020  Admitted From: Home Disposition:  SNF   Recommendations for Outpatient Follow-up:  1. Follow up with PCP in 1 week  Discharge Condition: Stable CODE STATUS: Full  Diet recommendation: Heart healthy   Brief/Interim Summary: Amanda Cain is a 74 year old female with past medical history significant for chronic diastolic heart failure, CKD stage 4, type 2 diabetes, hyperlipidemia, hypertension, GERD, hypothyroidism, pulmonary hypertension, obesity who presents with weakness, edema and dyspnea on exertion.  This has been going on for several days.  She also has had couple falls in the last day.  In the emergency department, she was found to have changes consistent with exacerbation of her diastolic heart failure. She was diuresed with IV lasix and transitioned to home PO torsemide.   Discharge Diagnoses:  Principal Problem:   Acute exacerbation of CHF (congestive heart failure) (HCC) Active Problems:   Diabetes ([place])   Acute kidney injury superimposed on CKD ([place])   Essential hypertension   Dyslipidemia   Asthma   Physical deconditioning   Acute on chronic diastolic heart failure -Continue Cardizem, Toprol.  Resume home Demadex  -Strict I's and O's, daily weight -Improved and stable on room air   CKD stage IV -Baseline creatinine 2.6-2.9 -Stable  Demand ischemia -Without complaints of chest pain.  Troponin has been flat 21 --> 21  Hypertension -Continue hydralazine, demadex, Cardizem, metoprolol  Weakness -PT OT recommending SNF placement  Asthma -Continue Singulair, Breo, albuterol  Diabetes mellitus type 2 -Continue Lantus, sliding scale insulin  Hyperlipidemia -Continue lipitor, Zetia  GERD -Continue PPI  Hypothyroidism -Continue Synthroid  Depression/anxiety -Continue  Lexapro, Ativan   Discharge Instructions  Discharge Instructions    Diet - low sodium heart healthy   Complete by: As directed    Increase activity slowly   Complete by: As directed      Allergies as of 04/20/2020      Reactions   Amlodipine    Dizziness per patient   Aspirin Other (See Comments)   " Have an ulcer"      Medication List    TAKE these medications   acetaminophen 500 MG tablet Commonly known as: TYLENOL Take 500 mg by mouth daily as needed for mild pain.   albuterol 108 (90 Base) MCG/ACT inhaler Commonly known as: VENTOLIN HFA Inhale 2 puffs into the lungs every 4 (four) hours as needed for wheezing or shortness of breath.   atorvastatin 80 MG tablet Commonly known as: LIPITOR Take 80 mg by mouth daily.   azelastine 0.1 % nasal spray Commonly known as: ASTELIN Place 1 spray into both nostrils 2 (two) times daily. Use in each nostril as directed   benzonatate 100 MG capsule Commonly known as: TESSALON Take 100 mg by mouth 3 (three) times daily.   Biotin 1000 MCG tablet Take 1,000 mcg by mouth daily.   BLISTEX COMPLETE MOISTURE EX Apply 1 application topically as needed (dry, cracked lips).   Breo Ellipta 200-25 MCG/INH Aepb Generic drug: fluticasone furoate-vilanterol INHALE 1 PUFF INTO THE LUNGS DAILY What changed: See the new instructions.   cholecalciferol 1000 units tablet Commonly known as: VITAMIN D Take 1,000 Units by mouth daily.   diltiazem 360 MG 24 hr capsule Commonly known as: CARDIZEM CD Take 360 mg by mouth daily.   escitalopram 20 MG tablet Commonly known as: LEXAPRO Take 20 mg by mouth at  bedtime.   estradiol 0.5 MG tablet Commonly known as: ESTRACE Take 0.5 mg by mouth every morning.   ezetimibe 10 MG tablet Commonly known as: ZETIA Take 10 mg by mouth daily.   guaiFENesin 600 MG 12 hr tablet Commonly known as: MUCINEX Take 600 mg by mouth 2 (two) times daily.   hydrALAZINE 25 MG tablet Commonly known as:  APRESOLINE Take 1 tablet (25 mg total) by mouth every 8 (eight) hours.   levothyroxine 200 MCG tablet Commonly known as: SYNTHROID Take 200 mcg by mouth every morning.   LORazepam 0.5 MG tablet Commonly known as: ATIVAN Take 0.5-1 tablets (0.25-0.5 mg total) by mouth See admin instructions. 0.25 mg twice daily, 0.5 mg at bedtime   metoprolol succinate 100 MG 24 hr tablet Commonly known as: TOPROL-XL Take 100 mg by mouth every morning.   montelukast 10 MG tablet Commonly known as: SINGULAIR TAKE 1 TABLET(10 MG) BY MOUTH AT BEDTIME What changed: See the new instructions.   multivitamin with minerals Tabs tablet Take 1 tablet by mouth daily.   ondansetron 4 MG disintegrating tablet Commonly known as: ZOFRAN-ODT Take 4 mg by mouth every 8 (eight) hours as needed for nausea or vomiting.   onetouch ultrasoft lancets SMARTSIG:1 Lancet(s) Via Meter Daily   paliperidone 3 MG 24 hr tablet Commonly known as: INVEGA Take 3 mg by mouth at bedtime.   pantoprazole 40 MG tablet Commonly known as: PROTONIX TAKE 1 TABLET(40 MG) BY MOUTH DAILY What changed: See the new instructions.   potassium chloride SA 20 MEQ tablet Commonly known as: KLOR-CON Take 1 tablet (20 mEq total) by mouth 2 (two) times daily.   saccharomyces boulardii 250 MG capsule Commonly known as: FLORASTOR Take 1 capsule (250 mg total) by mouth 2 (two) times daily.   sucralfate 1 g tablet Commonly known as: CARAFATE Take 1 g by mouth 2 (two) times daily.   Systane 0.4-0.3 % Soln Generic drug: Polyethyl Glycol-Propyl Glycol Place 1 drop into both eyes daily as needed (for dry eyes).   torsemide 100 MG tablet Commonly known as: DEMADEX Take 100 mg by mouth daily.   Tyler Aas FlexTouch 100 UNIT/ML FlexTouch Pen Generic drug: insulin degludec Inject 20 Units into the skin daily.   vitamin B-12 1000 MCG tablet Commonly known as: CYANOCOBALAMIN Take 1,000 mcg by mouth daily.   vitamin C 500 MG  tablet Commonly known as: ASCORBIC ACID Take 500 mg by mouth daily.       Follow-up Information    Mayra Neer, MD Follow up.   Specialty: Family Medicine Contact information: [address] [place] [address] [phone]        Jettie Booze, MD .   Specialties: Cardiology, Radiology, Interventional Cardiology Contact information: [address] [place] [address] [phone]              Allergies  Allergen Reactions  . Amlodipine     Dizziness per patient  . Aspirin Other (See Comments)    " Have an ulcer"    Consultations:  None    Procedures/Studies: DG Chest Portable 1 View  Result Date: 04/16/2020 CLINICAL DATA:  Shortness of breath EXAM: PORTABLE CHEST 1 VIEW COMPARISON:  February 20, 2020 FINDINGS: There is scarring in the left mid lung. The lungs elsewhere are clear. Heart is mildly enlarged with pulmonary vascularity normal. No adenopathy. There are surgical clips in the right supraclavicular region, stable. IMPRESSION: Scarring left mid lung. No edema or  airspace opacity. Stable cardiomegaly. No adenopathy. Electronically Signed   By: Lowella Grip III M.D.   On: 04/16/2020 18:07       Discharge Exam: Vitals:   04/19/20 2000 04/20/20 0550  BP: (!) 175/69 (!) 155/56  Pulse: 60 67  Resp: 17 18  Temp: 97.6 F (36.4 C) 97.8 F (36.6 C)  SpO2: 95% 96%    General: Pt is alert, awake, not in acute distress Cardiovascular: RRR, S1/S2 +, no edema Respiratory: CTA bilaterally, no wheezing, no rhonchi, no respiratory distress, no conversational dyspnea  Abdominal: Soft, NT, ND, bowel sounds + Extremities: no edema, no cyanosis Psych: Normal mood and affect, stable judgement and insight     The results of significant diagnostics from this hospitalization (including imaging, microbiology, ancillary and laboratory) are listed below for reference.     Microbiology: Recent Results (from  the past 240 hour(s))  Respiratory Panel by RT PCR (Flu A&B, Covid) - Nasopharyngeal Swab     Status: None   Collection Time: 04/16/20  5:54 PM   Specimen: Nasopharyngeal Swab; Nasopharyngeal(NP) swabs in vial transport medium  Result Value Ref Range Status   SARS Coronavirus 2 by RT PCR NEGATIVE NEGATIVE Final    Comment: (NOTE) SARS-CoV-2 target nucleic acids are NOT DETECTED.  The SARS-CoV-2 RNA is generally detectable in upper respiratoy specimens during the acute phase of infection. The lowest concentration of SARS-CoV-2 viral copies this assay can detect is 131 copies/mL. A negative result does not preclude SARS-Cov-2 infection and should not be used as the sole basis for treatment or other patient management decisions. A negative result may occur with  improper specimen collection/handling, submission of specimen other than nasopharyngeal swab, presence of viral mutation(s) within the areas targeted by this assay, and inadequate number of viral copies (<131 copies/mL). A negative result must be combined with clinical observations, patient history, and epidemiological information. The expected result is Negative.  Fact Sheet for Patients:  PinkCheek.be  Fact Sheet for Healthcare Providers:  GravelBags.it  This test is no t yet approved or cleared by the [place] FDA and  has been authorized for detection and/or diagnosis of SARS-CoV-2 by FDA under an Emergency Use Authorization (EUA). This EUA will remain  in effect (meaning this test can be used) for the duration of the COVID-19 declaration under Section 564(b)(1) of the Act, 21 U.S.C. section 360bbb-3(b)(1), unless the authorization is terminated or revoked sooner.     Influenza A by PCR NEGATIVE NEGATIVE Final   Influenza B by PCR NEGATIVE NEGATIVE Final    Comment: (NOTE) The Xpert Xpress SARS-CoV-2/FLU/RSV assay is intended as an aid in  the diagnosis of  influenza from Nasopharyngeal swab specimens and  should not be used as a sole basis for treatment. Nasal washings and  aspirates are unacceptable for Xpert Xpress SARS-CoV-2/FLU/RSV  testing.  Fact Sheet for Patients: PinkCheek.be  Fact Sheet for Healthcare Providers: GravelBags.it  This test is not yet approved or cleared by the [place] FDA and  has been authorized for detection and/or diagnosis of SARS-CoV-2 by  FDA under an Emergency Use Authorization (EUA). This EUA will remain  in effect (meaning this test can be used) for the duration of the  Covid-19 declaration under Section 564(b)(1) of the Act, 21  U.S.C. section 360bbb-3(b)(1), unless the authorization is  terminated or revoked. Performed at [facility], [place] [address], [place], [place] [address]      Labs: BNP (last 3 results) Recent Labs  02/20/20 1146 04/16/20 1719  BNP 119.7* 542.7*   Basic Metabolic Panel: Recent Labs  Lab 04/16/20 1719 04/17/20 0513 04/18/20 0231 04/19/20 0240 04/20/20 0241  NA 134* 136 135 136 134*  K 3.9 3.2* 4.5 3.7 4.3  CL 94* 98 98 99 97*  CO2 29 30 27 27 28   GLUCOSE 128* 152* 146* 94 170*  BUN 64* 59* 59* 54* 52*  CREATININE 3.01* 2.86* 2.84* 2.86* 2.76*  CALCIUM 9.5 9.0 8.9 8.8* 8.8*   Liver Function Tests: Recent Labs  Lab 04/16/20 1719  AST 22  ALT 28  ALKPHOS 65  BILITOT 0.6  PROT 6.3*  ALBUMIN 3.2*   No results for input(s): LIPASE, AMYLASE in the last 168 hours. No results for input(s): AMMONIA in the last 168 hours. CBC: Recent Labs  Lab 04/16/20 1719 04/17/20 0513  WBC 10.7* 9.0  NEUTROABS 8.7*  --   HGB 11.5* 11.2*  HCT 37.2 35.5*  MCV 88.6 87.4  PLT 300 265   Cardiac Enzymes: No results for input(s): CKTOTAL, CKMB, CKMBINDEX, TROPONINI in the last 168 hours. BNP: Invalid input(s): POCBNP CBG: Recent Labs  Lab 04/19/20 0756 04/19/20 1200 04/19/20 1702 04/19/20 2128  04/20/20 0829  GLUCAP 158* 185* 153* 150* 149*   D-Dimer No results for input(s): DDIMER in the last 72 hours. Hgb A1c No results for input(s): HGBA1C in the last 72 hours. Lipid Profile No results for input(s): CHOL, HDL, LDLCALC, TRIG, CHOLHDL, LDLDIRECT in the last 72 hours. Thyroid function studies No results for input(s): TSH, T4TOTAL, T3FREE, THYROIDAB in the last 72 hours.  Invalid input(s): FREET3 Anemia work up No results for input(s): VITAMINB12, FOLATE, FERRITIN, TIBC, IRON, RETICCTPCT in the last 72 hours. Urinalysis    Component Value Date/Time   COLORURINE YELLOW 04/16/2020 1748   APPEARANCEUR HAZY (A) 04/16/2020 1748   LABSPEC 1.010 04/16/2020 1748   PHURINE 6.0 04/16/2020 1748   GLUCOSEU NEGATIVE 04/16/2020 1748   HGBUR NEGATIVE 04/16/2020 [place] 04/16/2020 [place] 04/16/2020 1748   PROTEINUR 100 (A) 04/16/2020 1748   UROBILINOGEN 0.2 10/26/2012 0854   NITRITE NEGATIVE 04/16/2020 1748   LEUKOCYTESUR SMALL (A) 04/16/2020 1748   Sepsis Labs Invalid input(s): PROCALCITONIN,  WBC,  LACTICIDVEN Microbiology Recent Results (from the past 240 hour(s))  Respiratory Panel by RT PCR (Flu A&B, Covid) - Nasopharyngeal Swab     Status: None   Collection Time: 04/16/20  5:54 PM   Specimen: Nasopharyngeal Swab; Nasopharyngeal(NP) swabs in vial transport medium  Result Value Ref Range Status   SARS Coronavirus 2 by RT PCR NEGATIVE NEGATIVE Final    Comment: (NOTE) SARS-CoV-2 target nucleic acids are NOT DETECTED.  The SARS-CoV-2 RNA is generally detectable in upper respiratoy specimens during the acute phase of infection. The lowest concentration of SARS-CoV-2 viral copies this assay can detect is 131 copies/mL. A negative result does not preclude SARS-Cov-2 infection and should not be used as the sole basis for treatment or other patient management decisions. A negative result may occur with  improper specimen  collection/handling, submission of specimen other than nasopharyngeal swab, presence of viral mutation(s) within the areas targeted by this assay, and inadequate number of viral copies (<131 copies/mL). A negative result must be combined with clinical observations, patient history, and epidemiological information. The expected result is Negative.  Fact Sheet for Patients:  PinkCheek.be  Fact Sheet for Healthcare Providers:  GravelBags.it  This test is no t yet approved or cleared by the [place]  States FDA and  has been authorized for detection and/or diagnosis of SARS-CoV-2 by FDA under an Emergency Use Authorization (EUA). This EUA will remain  in effect (meaning this test can be used) for the duration of the COVID-19 declaration under Section 564(b)(1) of the Act, 21 U.S.C. section 360bbb-3(b)(1), unless the authorization is terminated or revoked sooner.     Influenza A by PCR NEGATIVE NEGATIVE Final   Influenza B by PCR NEGATIVE NEGATIVE Final    Comment: (NOTE) The Xpert Xpress SARS-CoV-2/FLU/RSV assay is intended as an aid in  the diagnosis of influenza from Nasopharyngeal swab specimens and  should not be used as a sole basis for treatment. Nasal washings and  aspirates are unacceptable for Xpert Xpress SARS-CoV-2/FLU/RSV  testing.  Fact Sheet for Patients: PinkCheek.be  Fact Sheet for Healthcare Providers: GravelBags.it  This test is not yet approved or cleared by the [place] FDA and  has been authorized for detection and/or diagnosis of SARS-CoV-2 by  FDA under an Emergency Use Authorization (EUA). This EUA will remain  in effect (meaning this test can be used) for the duration of the  Covid-19 declaration under Section 564(b)(1) of the Act, 21  U.S.C. section 360bbb-3(b)(1), unless the authorization is  terminated or revoked. Performed at [facility], [place] [address] [place], [place] [address]      Patient was seen and examined on the day of discharge and was found to be in stable condition. Time coordinating discharge: 35 minutes including assessment and coordination of care, as well as examination of the patient.   SIGNED:  Dessa Phi, DO [facility] 04/20/2020, 8:52 AM

## 2020-04-21 LAB — GLUCOSE, CAPILLARY: Glucose-Capillary: 164 mg/dL — ABNORMAL HIGH (ref 70–99)

## 2020-04-21 NOTE — Care Management Important Message (Signed)
Important Message  Patient Details  Name: Amanda Cain MRN: [id number] Date of Birth: [birth year]   Medicare Important Message Given:  Yes     Shelda Altes 04/21/2020, 11:49 AM

## 2020-04-21 NOTE — Plan of Care (Signed)

## 2020-04-21 NOTE — TOC Transition Note (Addendum)
Transition of Care [facility]) - CM/SW Discharge Note   Patient Details  Name: Amanda Cain MRN: [id number] Date of Birth: [birth year]  Transition of Care [facility]) CM/SW Contact:  Trula Ore, [place] Phone Number: 04/21/2020, 11:35 AM   Clinical Narrative:     Patient will DC to: [place] date: 04/21/2020  Family notified: [name]   Transport by: Corey Harold  ?  Per MD patient ready for DC to [facility] . RN, patient, patient's family, and facility notified of DC. Discharge Summary sent to facility. RN given number for report [phone] ask for [facility]. DC packet on chart. Ambulance transport requested for patient.  CSW signing off.  Final next level of care: Skilled Nursing Facility Barriers to Discharge: No Barriers Identified   Patient Goals and CMS Choice Patient states their goals for this hospitalization and ongoing recovery are:: to go to SNF CMS Medicare.gov Compare Post Acute Care list provided to:: Patient Choice offered to / list presented to : Patient  Discharge Placement              Patient chooses bed at: [facility] Patient to be transferred to facility by: [place] Name of family member notified: [name] Patient and family notified of of transfer: 04/21/20  Discharge Plan and Services                                     Social Determinants of Health (SDOH) Interventions     Readmission Risk Interventions Readmission Risk Prevention Plan 02/26/2020  Transportation Screening Complete  Medication Review Press photographer) Complete  PCP or Specialist appointment within 3-5 days of discharge Complete  SW Recovery Care/Counseling Consult Complete  [place] Patient Refused  Some recent data might be hidden

## 2020-04-21 NOTE — Progress Notes (Signed)
°  PROGRESS NOTE  Patient feeling well this morning. She has no physical complaints. Ready for discharge to SNF and remains medically stable.     Dessa Phi, DO [facility] 04/21/2020, 9:04 AM  Available via Epic secure chat 7am-7pm After these hours, please refer to coverage provider listed on [email or web address]

## 2020-04-22 ENCOUNTER — Ambulatory Visit: Payer: Medicare Other | Admitting: Podiatry

## 2020-05-01 ENCOUNTER — Ambulatory Visit: Payer: Medicare Other | Admitting: Endocrinology

## 2020-05-04 ENCOUNTER — Telehealth: Payer: Self-pay | Admitting: *Deleted

## 2020-05-04 NOTE — Telephone Encounter (Signed)
She also states they placed her back on Glimepiride medication as well.  Thanks!

## 2020-05-04 NOTE — Telephone Encounter (Signed)
Patient called stating she was in the hospital and she came out and "genovia" and she is unsure if she needs to take that.   Please call the patient [phone]

## 2020-05-04 NOTE — Telephone Encounter (Signed)
Patient is returning shannon's call.

## 2020-05-04 NOTE — Telephone Encounter (Signed)
Patient was placed on Januvia 50 mg in the hospital and wants to know if she should continue this.  Patient states her sugars are maintaining well since being home, patient is still on insulin at this time.

## 2020-05-04 NOTE — Telephone Encounter (Signed)
Left message for patient to call back  

## 2020-05-04 NOTE — Telephone Encounter (Signed)
Please continue the same medications I'll see you next week

## 2020-05-05 NOTE — Telephone Encounter (Signed)
Called LVM advising of message from MD.  Left call back number to advise if any questions or concerns.

## 2020-05-07 ENCOUNTER — Other Ambulatory Visit: Payer: Self-pay | Admitting: Family Medicine

## 2020-05-07 DIAGNOSIS — Z1231 Encounter for screening mammogram for malignant neoplasm of breast: Secondary | ICD-10-CM

## 2020-05-13 ENCOUNTER — Telehealth: Payer: Self-pay | Admitting: Interventional Cardiology

## 2020-05-13 NOTE — Telephone Encounter (Signed)
Spoke with pt who reports B/P taken by physical therapist this morning prior to therapy was 188/91 and 194/92.  PT canceled pt's appointment today for therapy.  Pt denies additional symptoms at this time.  She was discharged from hospital on 12/2 for CHF exacerbation and was seen by her PCP 05/12/2020 who recommended she contact Dr Irish Lack re: her increased BP.  Pt states she is taking medications as prescribed and is on a low salt diet but did eat 2 hot dogs yesterday.    Provided education re: high sodium foods and drink. Pt has hospital f/u visit with Dr Irish Lack on 06/23/2020.  Pt advised to continue to monitor BP at home and take medications as prescribed. Will forward information to Dr Irish Lack for review and recommendation.  Reviewed ED precautions.  Pt verbalizes understanding and agrees with current plan.

## 2020-05-13 NOTE — Telephone Encounter (Signed)
Spoke with pt and advised per Dr Irish Lack, increase Hydralazine to 50mg  - 2 tablets by mouth TID.  Pt verbalizes understanding and will continue to monitor her BP at home.

## 2020-05-13 NOTE — Telephone Encounter (Signed)
Increase hydralazine to 50 mg TID. JV

## 2020-05-13 NOTE — Telephone Encounter (Signed)
Pt c/o BP issue: STAT if pt c/o blurred vision, one-sided weakness or slurred speech  1. What are your last 5 BP readings?    188/91 194/92  Both taken today  2. Are you having any other symptoms (ex. Dizziness, headache, blurred vision, passed out)? no  3. What is your BP issue? Patient had a home health PT come and the PT could not do treatment because the patient's BP was so high. The PT advised the patient to call 911 but the patient ws just released from the hospital. The patient is not sure what to do. Please advise

## 2020-05-14 ENCOUNTER — Encounter: Payer: Self-pay | Admitting: Endocrinology

## 2020-05-14 ENCOUNTER — Ambulatory Visit (INDEPENDENT_AMBULATORY_CARE_PROVIDER_SITE_OTHER): Payer: Medicare Other | Admitting: Endocrinology

## 2020-05-14 ENCOUNTER — Other Ambulatory Visit: Payer: Self-pay

## 2020-05-14 VITALS — BP 162/84 | HR 70 | Ht 59.0 in | Wt 216.0 lb

## 2020-05-14 DIAGNOSIS — N184 Chronic kidney disease, stage 4 (severe): Secondary | ICD-10-CM

## 2020-05-14 DIAGNOSIS — E118 Type 2 diabetes mellitus with unspecified complications: Secondary | ICD-10-CM | POA: Diagnosis not present

## 2020-05-14 DIAGNOSIS — E1122 Type 2 diabetes mellitus with diabetic chronic kidney disease: Secondary | ICD-10-CM | POA: Diagnosis not present

## 2020-05-14 DIAGNOSIS — Z794 Long term (current) use of insulin: Secondary | ICD-10-CM | POA: Diagnosis not present

## 2020-05-14 LAB — POCT GLYCOSYLATED HEMOGLOBIN (HGB A1C): Hemoglobin A1C: 6.3 % — AB (ref 4.0–5.6)

## 2020-05-14 MED ORDER — ONETOUCH VERIO VI STRP: 1.0000 | ORAL_STRIP | Freq: Two times a day (BID) | 3 refills | Status: DC
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
## 2020-05-14 NOTE — Patient Instructions (Addendum)
Your blood pressure is high today.  Please see your primary care provider soon, to have it rechecked check your blood sugar twice a day.  vary the time of day when you check, between before the 3 meals, and at bedtime.  also check if you have symptoms of your blood sugar being too high or too low.  please keep a record of the readings and bring it to your next appointment here (or you can bring the meter itself).  You can write it on any piece of paper.  please call us sooner if your blood sugar goes below 70, or if you have a lot of readings over 200. We will need to take this complex situation in stages For now, please continue the tresiba, and: Please stop taking the glimepiride.   Here is a new meter.  I have sent a prescription to your pharmacy, for strips.   Please come back for a follow-up appointment in 6 weeks.

## 2020-05-15 ENCOUNTER — Other Ambulatory Visit: Payer: Self-pay

## 2020-05-15 ENCOUNTER — Telehealth: Payer: Self-pay | Admitting: Endocrinology

## 2020-05-15 MED ORDER — TRESIBA FLEXTOUCH 100 UNIT/ML ~~LOC~~ SOPN: 20.0000 [IU] | PEN_INJECTOR | Freq: Every day | SUBCUTANEOUS | 11 refills | Status: DC

## 2020-05-15 NOTE — Telephone Encounter (Signed)
The Tresiba 20 units that is in patient's chart is listed as "No Print" and this needs to be called into:  [id number] - [place], [place] AT [place]  [address] [place] [address]  Phone:  [phone] Fax:  [phone]

## 2020-05-15 NOTE — Telephone Encounter (Signed)
RX sent to pharmacy. Thanks

## 2020-05-15 NOTE — Telephone Encounter (Signed)
Please refill PRN 

## 2020-05-15 NOTE — Telephone Encounter (Signed)
Please see below.

## 2020-05-26 ENCOUNTER — Other Ambulatory Visit: Payer: Self-pay | Admitting: Pulmonary Disease

## 2020-05-29 ENCOUNTER — Other Ambulatory Visit: Payer: Self-pay | Admitting: Pulmonary Disease

## 2020-06-01 DIAGNOSIS — I272 Pulmonary hypertension, unspecified: Secondary | ICD-10-CM | POA: Diagnosis not present

## 2020-06-01 DIAGNOSIS — D509 Iron deficiency anemia, unspecified: Secondary | ICD-10-CM | POA: Diagnosis not present

## 2020-06-01 DIAGNOSIS — K219 Gastro-esophageal reflux disease without esophagitis: Secondary | ICD-10-CM | POA: Diagnosis not present

## 2020-06-01 DIAGNOSIS — I5033 Acute on chronic diastolic (congestive) heart failure: Secondary | ICD-10-CM | POA: Diagnosis not present

## 2020-06-01 DIAGNOSIS — E1122 Type 2 diabetes mellitus with diabetic chronic kidney disease: Secondary | ICD-10-CM | POA: Diagnosis not present

## 2020-06-01 DIAGNOSIS — D631 Anemia in chronic kidney disease: Secondary | ICD-10-CM | POA: Diagnosis not present

## 2020-06-01 DIAGNOSIS — E785 Hyperlipidemia, unspecified: Secondary | ICD-10-CM | POA: Diagnosis not present

## 2020-06-01 DIAGNOSIS — N184 Chronic kidney disease, stage 4 (severe): Secondary | ICD-10-CM | POA: Diagnosis not present

## 2020-06-01 DIAGNOSIS — J45909 Unspecified asthma, uncomplicated: Secondary | ICD-10-CM | POA: Diagnosis not present

## 2020-06-01 DIAGNOSIS — M170 Bilateral primary osteoarthritis of knee: Secondary | ICD-10-CM | POA: Diagnosis not present

## 2020-06-01 DIAGNOSIS — I130 Hypertensive heart and chronic kidney disease with heart failure and stage 1 through stage 4 chronic kidney disease, or unspecified chronic kidney disease: Secondary | ICD-10-CM | POA: Diagnosis not present

## 2020-06-01 DIAGNOSIS — Z9181 History of falling: Secondary | ICD-10-CM | POA: Diagnosis not present

## 2020-06-01 DIAGNOSIS — Z7951 Long term (current) use of inhaled steroids: Secondary | ICD-10-CM | POA: Diagnosis not present

## 2020-06-01 DIAGNOSIS — F32A Depression, unspecified: Secondary | ICD-10-CM | POA: Diagnosis not present

## 2020-06-01 DIAGNOSIS — Z794 Long term (current) use of insulin: Secondary | ICD-10-CM | POA: Diagnosis not present

## 2020-06-01 DIAGNOSIS — N179 Acute kidney failure, unspecified: Secondary | ICD-10-CM | POA: Diagnosis not present

## 2020-06-01 DIAGNOSIS — E039 Hypothyroidism, unspecified: Secondary | ICD-10-CM | POA: Diagnosis not present

## 2020-06-01 DIAGNOSIS — J9610 Chronic respiratory failure, unspecified whether with hypoxia or hypercapnia: Secondary | ICD-10-CM | POA: Diagnosis not present

## 2020-06-03 ENCOUNTER — Ambulatory Visit: Payer: Medicare Other | Admitting: Interventional Cardiology

## 2020-06-03 DIAGNOSIS — N179 Acute kidney failure, unspecified: Secondary | ICD-10-CM | POA: Diagnosis not present

## 2020-06-03 DIAGNOSIS — E039 Hypothyroidism, unspecified: Secondary | ICD-10-CM | POA: Diagnosis not present

## 2020-06-03 DIAGNOSIS — Z9181 History of falling: Secondary | ICD-10-CM | POA: Diagnosis not present

## 2020-06-03 DIAGNOSIS — N184 Chronic kidney disease, stage 4 (severe): Secondary | ICD-10-CM | POA: Diagnosis not present

## 2020-06-03 DIAGNOSIS — D509 Iron deficiency anemia, unspecified: Secondary | ICD-10-CM | POA: Diagnosis not present

## 2020-06-03 DIAGNOSIS — F32A Depression, unspecified: Secondary | ICD-10-CM | POA: Diagnosis not present

## 2020-06-03 DIAGNOSIS — Z7951 Long term (current) use of inhaled steroids: Secondary | ICD-10-CM | POA: Diagnosis not present

## 2020-06-03 DIAGNOSIS — I272 Pulmonary hypertension, unspecified: Secondary | ICD-10-CM | POA: Diagnosis not present

## 2020-06-03 DIAGNOSIS — E785 Hyperlipidemia, unspecified: Secondary | ICD-10-CM | POA: Diagnosis not present

## 2020-06-03 DIAGNOSIS — I130 Hypertensive heart and chronic kidney disease with heart failure and stage 1 through stage 4 chronic kidney disease, or unspecified chronic kidney disease: Secondary | ICD-10-CM | POA: Diagnosis not present

## 2020-06-03 DIAGNOSIS — K219 Gastro-esophageal reflux disease without esophagitis: Secondary | ICD-10-CM | POA: Diagnosis not present

## 2020-06-03 DIAGNOSIS — D631 Anemia in chronic kidney disease: Secondary | ICD-10-CM | POA: Diagnosis not present

## 2020-06-03 DIAGNOSIS — J9610 Chronic respiratory failure, unspecified whether with hypoxia or hypercapnia: Secondary | ICD-10-CM | POA: Diagnosis not present

## 2020-06-03 DIAGNOSIS — Z794 Long term (current) use of insulin: Secondary | ICD-10-CM | POA: Diagnosis not present

## 2020-06-03 DIAGNOSIS — I5033 Acute on chronic diastolic (congestive) heart failure: Secondary | ICD-10-CM | POA: Diagnosis not present

## 2020-06-03 DIAGNOSIS — M170 Bilateral primary osteoarthritis of knee: Secondary | ICD-10-CM | POA: Diagnosis not present

## 2020-06-03 DIAGNOSIS — E1122 Type 2 diabetes mellitus with diabetic chronic kidney disease: Secondary | ICD-10-CM | POA: Diagnosis not present

## 2020-06-03 DIAGNOSIS — J45909 Unspecified asthma, uncomplicated: Secondary | ICD-10-CM | POA: Diagnosis not present

## 2020-06-19 ENCOUNTER — Ambulatory Visit: Payer: Medicare Other

## 2020-06-21 DIAGNOSIS — R269 Unspecified abnormalities of gait and mobility: Secondary | ICD-10-CM | POA: Diagnosis not present

## 2020-06-21 DIAGNOSIS — I5032 Chronic diastolic (congestive) heart failure: Secondary | ICD-10-CM | POA: Diagnosis not present

## 2020-06-21 DIAGNOSIS — Z96659 Presence of unspecified artificial knee joint: Secondary | ICD-10-CM | POA: Diagnosis not present

## 2020-06-23 ENCOUNTER — Ambulatory Visit: Payer: Medicare Other | Admitting: Interventional Cardiology

## 2020-06-29 DIAGNOSIS — I129 Hypertensive chronic kidney disease with stage 1 through stage 4 chronic kidney disease, or unspecified chronic kidney disease: Secondary | ICD-10-CM | POA: Diagnosis not present

## 2020-06-29 DIAGNOSIS — I5033 Acute on chronic diastolic (congestive) heart failure: Secondary | ICD-10-CM | POA: Diagnosis not present

## 2020-06-29 DIAGNOSIS — D631 Anemia in chronic kidney disease: Secondary | ICD-10-CM | POA: Diagnosis not present

## 2020-06-29 DIAGNOSIS — N184 Chronic kidney disease, stage 4 (severe): Secondary | ICD-10-CM | POA: Diagnosis not present

## 2020-06-29 DIAGNOSIS — N2581 Secondary hyperparathyroidism of renal origin: Secondary | ICD-10-CM | POA: Diagnosis not present

## 2020-06-29 DIAGNOSIS — N189 Chronic kidney disease, unspecified: Secondary | ICD-10-CM | POA: Diagnosis not present

## 2020-07-02 ENCOUNTER — Other Ambulatory Visit: Payer: Self-pay

## 2020-07-02 ENCOUNTER — Ambulatory Visit (INDEPENDENT_AMBULATORY_CARE_PROVIDER_SITE_OTHER): Payer: Medicare Other | Admitting: Endocrinology

## 2020-07-02 VITALS — BP 196/70 | HR 79 | Ht 59.35 in | Wt 215.8 lb

## 2020-07-02 DIAGNOSIS — N184 Chronic kidney disease, stage 4 (severe): Secondary | ICD-10-CM | POA: Diagnosis not present

## 2020-07-02 DIAGNOSIS — E1122 Type 2 diabetes mellitus with diabetic chronic kidney disease: Secondary | ICD-10-CM

## 2020-07-02 DIAGNOSIS — E118 Type 2 diabetes mellitus with unspecified complications: Secondary | ICD-10-CM

## 2020-07-02 DIAGNOSIS — Z794 Long term (current) use of insulin: Secondary | ICD-10-CM | POA: Diagnosis not present

## 2020-07-02 LAB — POCT GLYCOSYLATED HEMOGLOBIN (HGB A1C): Hemoglobin A1C: 6.6 % — AB (ref 4.0–5.6)

## 2020-07-02 MED ORDER — ONETOUCH VERIO VI STRP: 1.0000 | ORAL_STRIP | Freq: Two times a day (BID) | 3 refills | Status: AC
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
## 2020-07-02 NOTE — Patient Instructions (Addendum)
Your blood pressure is high today.  Please see your primary care provider soon, to have it rechecked check your blood sugar twice a day.  vary the time of day when you check, between before the 3 meals, and at bedtime.  also check if you have symptoms of your blood sugar being too high or too low.  please keep a record of the readings and bring it to your next appointment here (or you can bring the meter itself).  You can write it on any piece of paper.  please call us sooner if your blood sugar goes below 70, or if you have a lot of readings over 200. A different type of diabetes blood test is requested for you today.  We'll let you know about the results.  For now, please continue the tresiba. I have sent a prescription to your pharmacy, for strips.   Please come back for a follow-up appointment in 6 weeks.

## 2020-07-06 ENCOUNTER — Other Ambulatory Visit: Payer: Self-pay | Admitting: Pulmonary Disease
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
## 2020-07-07 LAB — FRUCTOSAMINE: Fructosamine: 287 umol/L — ABNORMAL HIGH (ref 205–285)

## 2020-07-08 ENCOUNTER — Ambulatory Visit: Payer: Medicare Other

## 2020-07-09 ENCOUNTER — Telehealth: Payer: Self-pay | Admitting: Endocrinology

## 2020-07-09 NOTE — Telephone Encounter (Signed)
Patient states she is returning a call and requests to be called at ph# [phone]

## 2020-07-10 NOTE — Telephone Encounter (Signed)
Notified with lab results

## 2020-07-15 ENCOUNTER — Encounter: Payer: Self-pay | Admitting: Podiatry

## 2020-07-15 ENCOUNTER — Ambulatory Visit (INDEPENDENT_AMBULATORY_CARE_PROVIDER_SITE_OTHER): Payer: Medicare Other | Admitting: Podiatry

## 2020-07-15 ENCOUNTER — Other Ambulatory Visit: Payer: Self-pay

## 2020-07-15 DIAGNOSIS — E118 Type 2 diabetes mellitus with unspecified complications: Secondary | ICD-10-CM | POA: Diagnosis not present

## 2020-07-15 DIAGNOSIS — B351 Tinea unguium: Secondary | ICD-10-CM

## 2020-07-15 DIAGNOSIS — M79674 Pain in right toe(s): Secondary | ICD-10-CM

## 2020-07-15 DIAGNOSIS — N183 Chronic kidney disease, stage 3 unspecified: Secondary | ICD-10-CM

## 2020-07-15 DIAGNOSIS — M79675 Pain in left toe(s): Secondary | ICD-10-CM | POA: Diagnosis not present

## 2020-07-15 NOTE — Progress Notes (Signed)
This patient returns to my office for at risk foot care.  This patient requires this care by a professional since this patient will be at risk due to having diabetes and chronic kidney disease.  Patient has not been seen in over 6 months.  This patient is unable to cut nails himself since the patient cannot reach his nails.These nails are painful walking and wearing shoes.  This patient presents for at risk foot care today.  General Appearance  Alert, conversant and in no acute stress.  Vascular  Dorsalis pedis and posterior tibial  pulses are weakly  palpable  bilaterally.  Capillary return is within normal limits  bilaterally. Temperature is within normal limits  bilaterally.  Neurologic  Senn-Weinstein monofilament wire test within normal limits  bilaterally. Muscle power within normal limits bilaterally.  Nails Thick disfigured discolored nails with subungual debris  from hallux to fifth toes bilaterally. No evidence of bacterial infection or drainage bilaterally.  Orthopedic  No limitations of motion  feet .  No crepitus or effusions noted.  No bony pathology or digital deformities noted.  HAV  .  Pes planus.  Skin  normotropic skin with no porokeratosis noted bilaterally.  No signs of infections or ulcers noted.     Onychomycosis  Pain in right toes  Pain in left toes  Consent was obtained for treatment procedures.   Mechanical debridement of nails 1-5  bilaterally performed with a nail nipper.  Filed with dremel without incident.    Return office visit   3 months                  Told patient to return for periodic foot care and evaluation due to potential at risk complications.   Gardiner Barefoot DPM

## 2020-07-20 ENCOUNTER — Ambulatory Visit (INDEPENDENT_AMBULATORY_CARE_PROVIDER_SITE_OTHER): Payer: Medicare Other | Admitting: Nurse Practitioner

## 2020-07-20 ENCOUNTER — Encounter: Payer: Self-pay | Admitting: Nurse Practitioner

## 2020-07-20 ENCOUNTER — Other Ambulatory Visit: Payer: Self-pay

## 2020-07-20 VITALS — BP 126/60 | HR 60 | Ht 59.75 in | Wt 216.6 lb

## 2020-07-20 DIAGNOSIS — N184 Chronic kidney disease, stage 4 (severe): Secondary | ICD-10-CM | POA: Diagnosis not present

## 2020-07-20 DIAGNOSIS — I10 Essential (primary) hypertension: Secondary | ICD-10-CM

## 2020-07-20 DIAGNOSIS — E785 Hyperlipidemia, unspecified: Secondary | ICD-10-CM | POA: Diagnosis not present

## 2020-07-20 DIAGNOSIS — I5032 Chronic diastolic (congestive) heart failure: Secondary | ICD-10-CM

## 2020-07-20 NOTE — Patient Instructions (Addendum)
After Visit Summary:  We will be checking the following labs today - NONE   Medication Instructions:    Continue with your current medicines.    If you need a refill on your cardiac medications before your next appointment, please call your pharmacy.     Testing/Procedures To Be Arranged:  N/A  Follow-Up:   See Dr. Irish Lack in 4 to 6 months    At [facility], you and your health needs are our priority.  As part of our continuing mission to provide you with exceptional heart care, we have created designated Provider Care Teams.  These Care Teams include your primary Cardiologist (physician) and Advanced Practice Providers (APPs -  Physician Assistants and Nurse Practitioners) who all work together to provide you with the care you need, when you need it.  Special Instructions:  . Stay safe, wash your hands for at least 20 seconds and wear a mask when needed.  . It was good to talk with you today. . Write your questions down about dialysis and discuss these with the kidney doctor.     Call the [place] office at [phone] if you have any questions, problems or concerns.

## 2020-07-22 DIAGNOSIS — I5032 Chronic diastolic (congestive) heart failure: Secondary | ICD-10-CM | POA: Diagnosis not present

## 2020-07-22 DIAGNOSIS — R269 Unspecified abnormalities of gait and mobility: Secondary | ICD-10-CM | POA: Diagnosis not present

## 2020-07-22 DIAGNOSIS — Z96659 Presence of unspecified artificial knee joint: Secondary | ICD-10-CM | POA: Diagnosis not present

## 2020-07-26 IMAGING — DX DG CHEST 2V
2 series · 2 of 2 positions shown · non-contrast
Comparison: March 31, 2018

CLINICAL DATA: This of breath for a week.

EXAM:
CHEST - 2 VIEW

[chest lat]
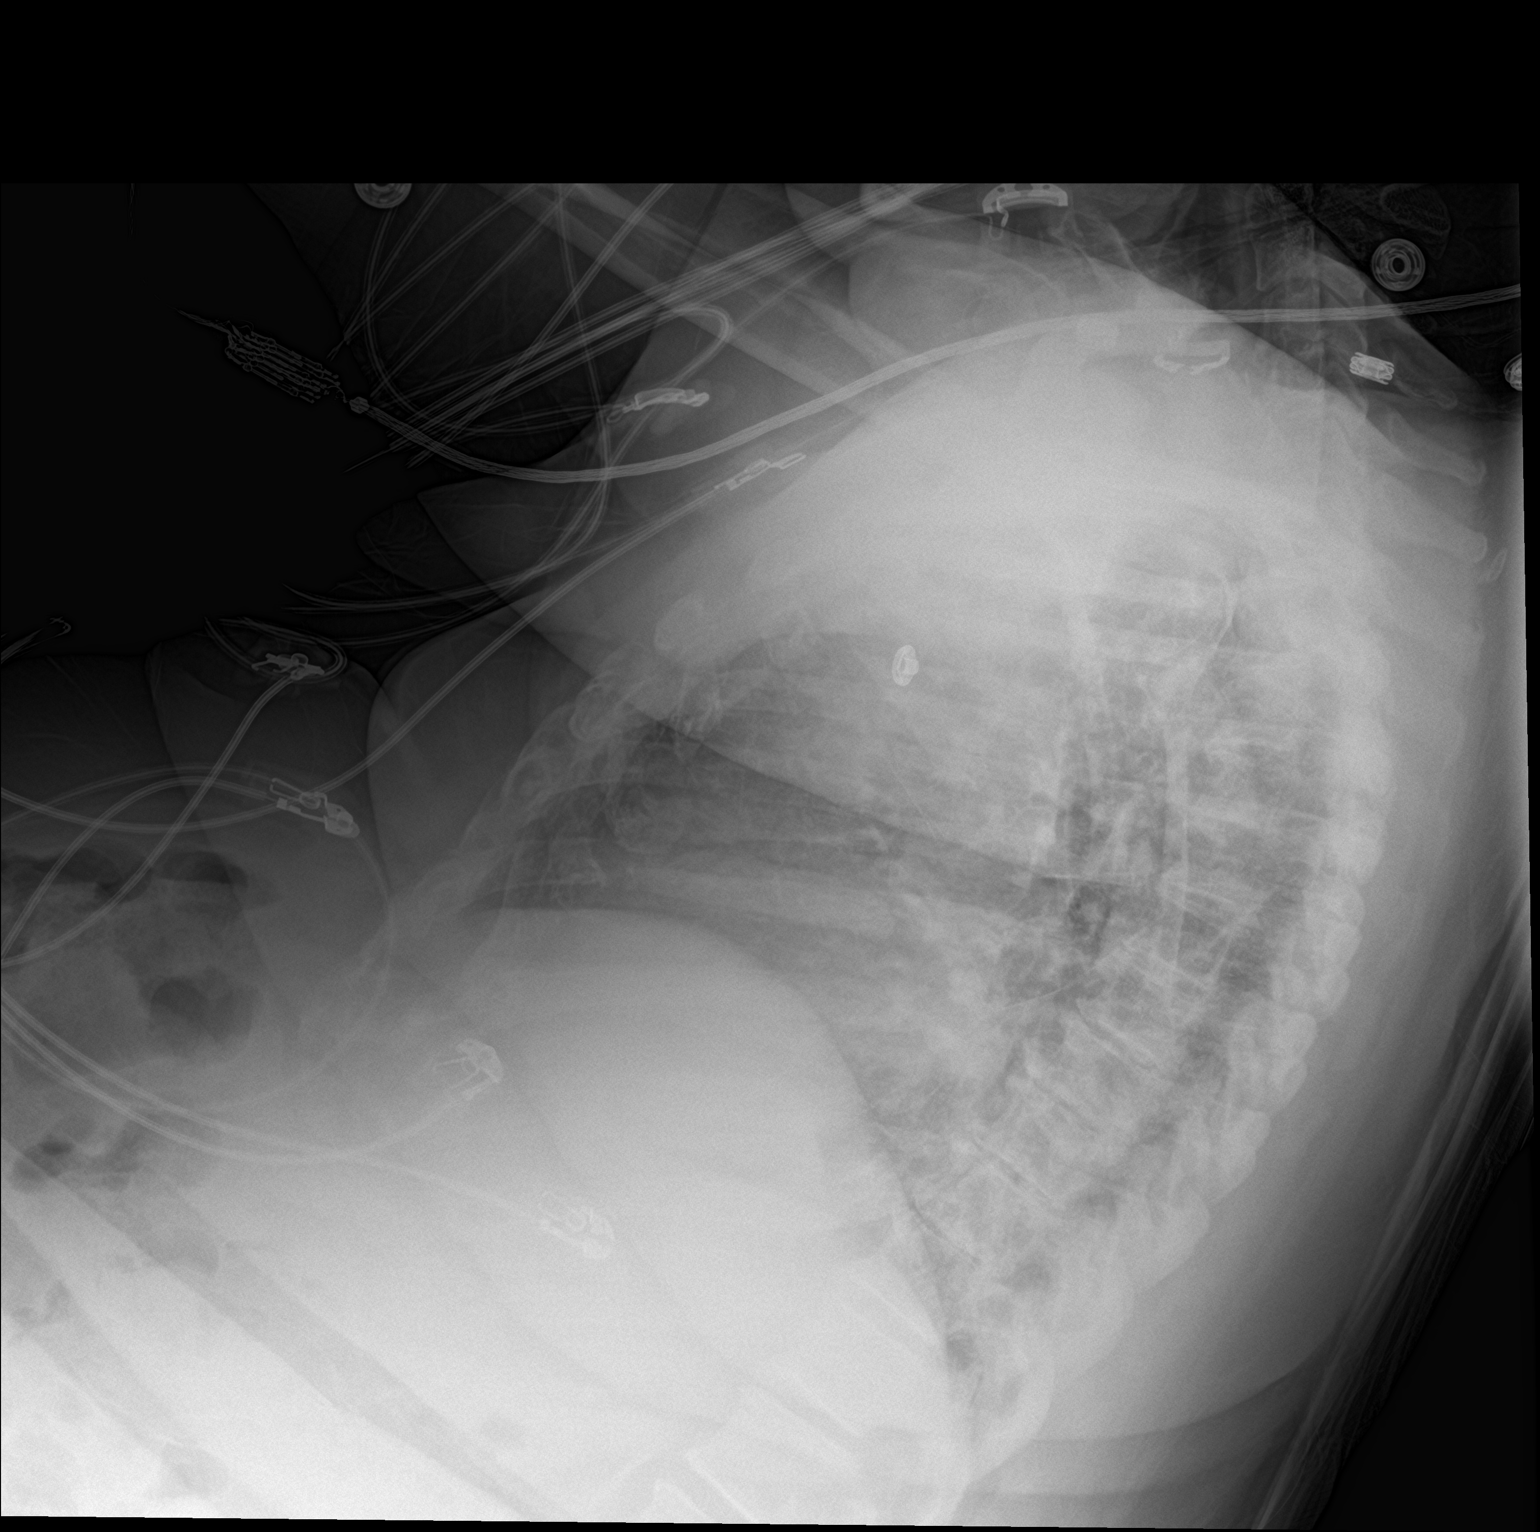

[chest ap]
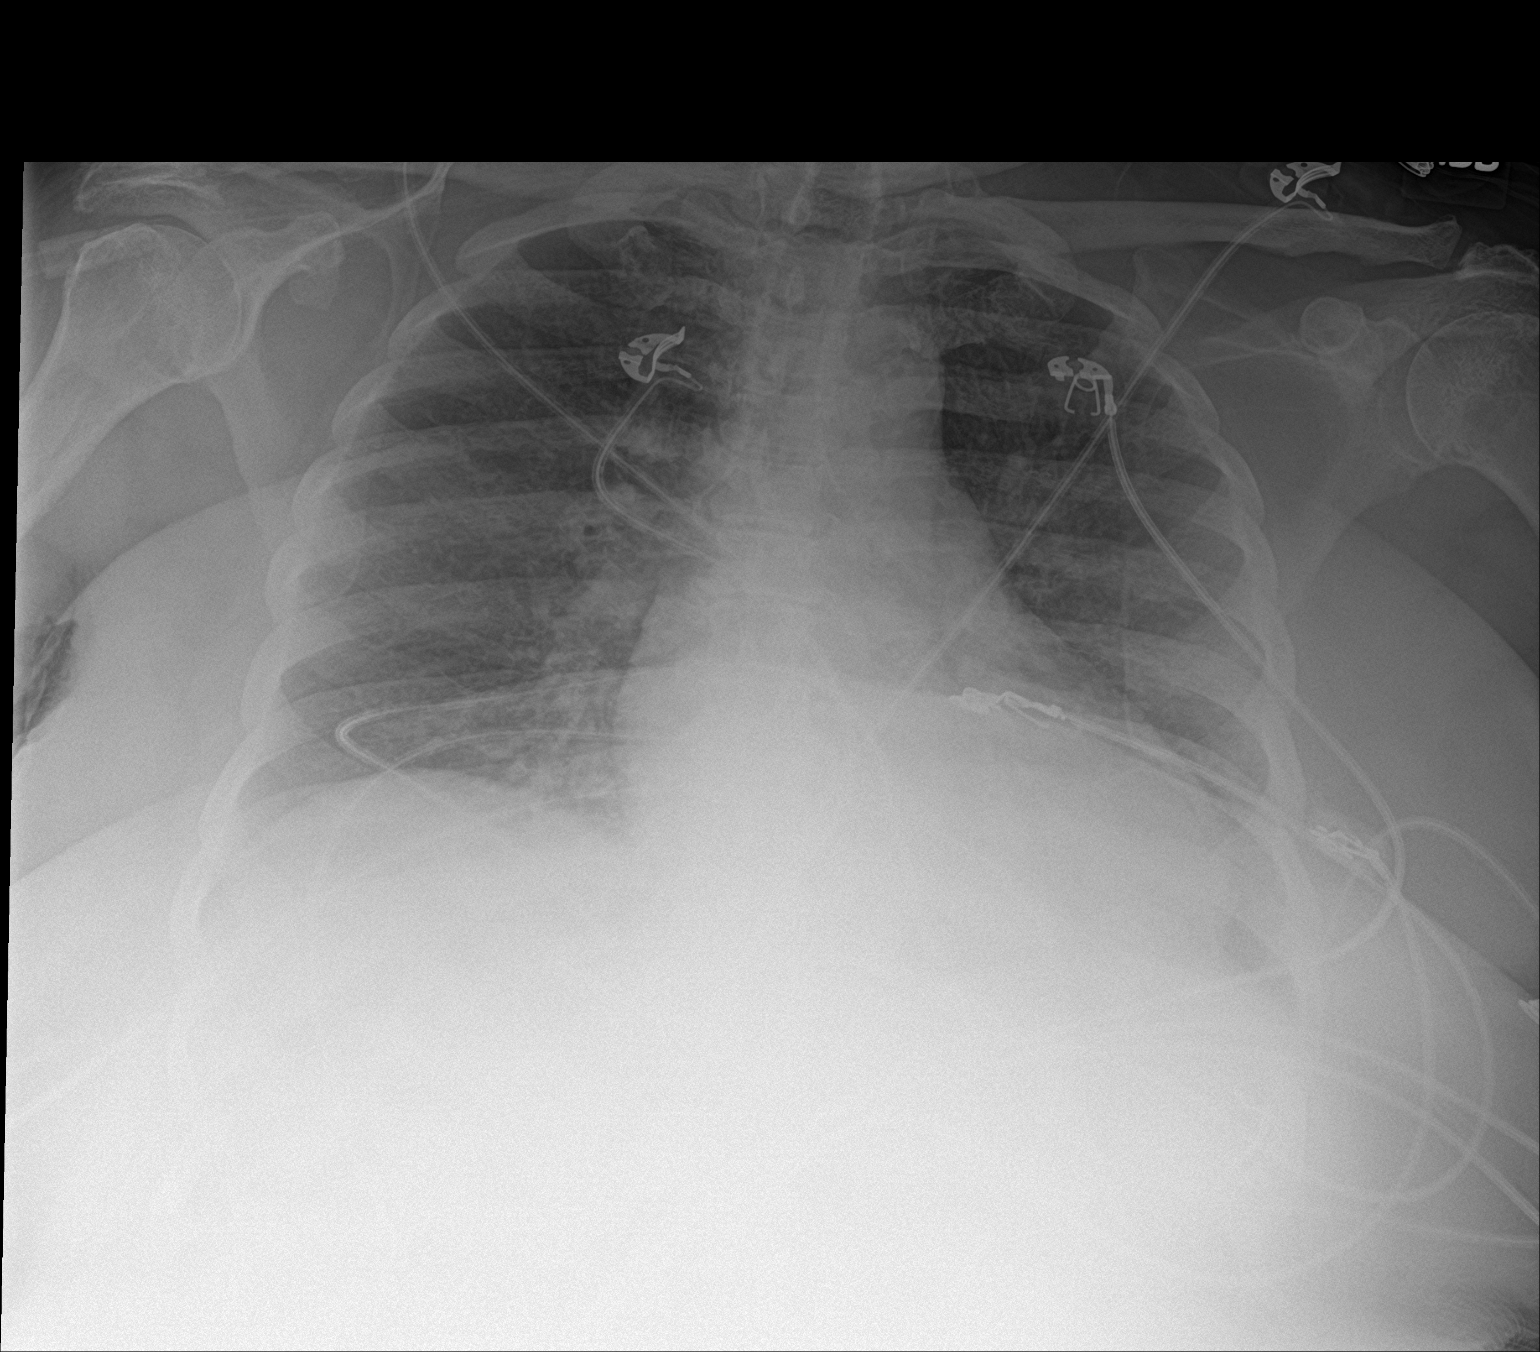

[2 of 2 positions shown; findings below may reference images not displayed]

FINDINGS: The heart size is borderline to mildly enlarged. Possible mild
pulmonary venous congestion with no overt edema. No nodules, masses,
or focal infiltrates.
IMPRESSION: Mild cardiomegaly. Mild pulmonary venous congestion not excluded. No
focal infiltrate.

## 2020-07-27 ENCOUNTER — Telehealth: Payer: Self-pay | Admitting: Endocrinology

## 2020-07-27 NOTE — Telephone Encounter (Signed)
Spoke with pt to let her know per Dr. Loanne Drilling to start fresh with her insulin tomorrow.

## 2020-07-27 NOTE — Telephone Encounter (Signed)
Please start fresh tomorrow.

## 2020-07-27 NOTE — Telephone Encounter (Signed)
Please Advise

## 2020-07-27 NOTE — Telephone Encounter (Signed)
PT called because she went to take her insulin this morning and when she went to inject the needle got bent and she couldn't get anymore out. She states she then took an old prescribed pill Dr prescribed to make blood sugar better and is wanting to know once she picks up the new needles from her pharmacy today if she should go ahead and inject her insulin when she receives the needle or wait until tomorrow?   Callback number: [phone]

## 2020-07-29 DIAGNOSIS — I129 Hypertensive chronic kidney disease with stage 1 through stage 4 chronic kidney disease, or unspecified chronic kidney disease: Secondary | ICD-10-CM | POA: Diagnosis not present

## 2020-07-29 DIAGNOSIS — E782 Mixed hyperlipidemia: Secondary | ICD-10-CM | POA: Diagnosis not present

## 2020-07-29 DIAGNOSIS — E039 Hypothyroidism, unspecified: Secondary | ICD-10-CM | POA: Diagnosis not present

## 2020-07-29 DIAGNOSIS — E1122 Type 2 diabetes mellitus with diabetic chronic kidney disease: Secondary | ICD-10-CM | POA: Diagnosis not present

## 2020-07-29 DIAGNOSIS — J42 Unspecified chronic bronchitis: Secondary | ICD-10-CM | POA: Diagnosis not present

## 2020-07-29 DIAGNOSIS — R946 Abnormal results of thyroid function studies: Secondary | ICD-10-CM | POA: Diagnosis not present

## 2020-07-29 DIAGNOSIS — N184 Chronic kidney disease, stage 4 (severe): Secondary | ICD-10-CM | POA: Diagnosis not present

## 2020-07-29 DIAGNOSIS — I5030 Unspecified diastolic (congestive) heart failure: Secondary | ICD-10-CM | POA: Diagnosis not present

## 2020-07-31 ENCOUNTER — Other Ambulatory Visit: Payer: Self-pay | Admitting: Family Medicine

## 2020-07-31 ENCOUNTER — Ambulatory Visit: Payer: Medicare Other

## 2020-07-31 DIAGNOSIS — Z1231 Encounter for screening mammogram for malignant neoplasm of breast: Secondary | ICD-10-CM

## 2020-08-04 ENCOUNTER — Encounter: Payer: Self-pay | Admitting: Dietician

## 2020-08-04 ENCOUNTER — Other Ambulatory Visit: Payer: Self-pay

## 2020-08-04 ENCOUNTER — Encounter: Payer: Medicare Other | Attending: Nephrology | Admitting: Dietician

## 2020-08-04 DIAGNOSIS — I5033 Acute on chronic diastolic (congestive) heart failure: Secondary | ICD-10-CM | POA: Diagnosis not present

## 2020-08-04 DIAGNOSIS — Z794 Long term (current) use of insulin: Secondary | ICD-10-CM | POA: Diagnosis not present

## 2020-08-04 DIAGNOSIS — E1122 Type 2 diabetes mellitus with diabetic chronic kidney disease: Secondary | ICD-10-CM | POA: Diagnosis not present

## 2020-08-04 DIAGNOSIS — N184 Chronic kidney disease, stage 4 (severe): Secondary | ICD-10-CM | POA: Insufficient documentation

## 2020-08-04 NOTE — Patient Instructions (Addendum)
Recommend drink water rather than juice and soda most of the time. Avoid salt substitutes such as No salt and Lite Salt. Recommend avoid added salt.  Balance your meals   Consider decreasing the carbohydrate in your breakfast and increasing this slightly for lunch and dinner  For example:    Breakfast:  2 starches, 1/2 cup milk, 1 fruit, 1 egg       Lunch and Dinner:  2 starches, 2 ounces meat, 1 fruit, vegetables  Avoid eating snacks when you are not hungry.

## 2020-08-04 NOTE — Progress Notes (Signed)
Medical Nutrition Therapy  Appointment Start time:  [phone]  Appointment End time:  89  Patient is here today with her sister and niece.  Primary concerns today: She would like to learn more about nutrition and her kidney. Referral diagnosis: Type 2 Diabetes, CKD (stage 4), morbid obesity, HTN, HLD, GERD Preferred learning style: no preference indicated Learning readiness:   contemplating   NUTRITION ASSESSMENT   Anthropometrics  Wt Readings from Last 3 Encounters:  08/04/20 216 lb (98 kg)  07/20/20 216 lb 9.6 oz (98.2 kg)  07/02/20 215 lb 12.8 oz (97.9 kg)   Clinical Medical Hx: Type 2 Diabetes CKD Morbid obesity, HTN, HLD, GERD, hypothyroid Medications: Tresiba 20 units q am Labs: A1C 6.6% 07/02/20 increased from 6.3% 05/14/2020 Notable Signs/Symptoms: States she feels well, just weak and tired., edema  Lifestyle & Dietary Hx Patient lives alone.  She has an aid 5 hours per day 7 days per week.  Her aid helps with cooking at times.  Her sister will help with food at times..  Her sister does the shopping.  Family is concerned that one of the aides is not doing her job. Family prepares food without salt and patient adds a little. Patient does not like to eat or drink anything that is sugar free most of the time.  Estimated daily fluid intake: 1.5L Supplements: vitamin B-12, vitamin C, Vitamin C, biotin  Sleep: 8-9 Stress / self-care: has an aide 5 hours daily Current average weekly physical activity: ADL's, walks with a cane  24-Hr Dietary Recall First Meal: cereal (honey nut cheerios), 2% milk, white toast with margarine, fruit, juice, coffee with regular coffee mate, sugar or splenda Snack: graham crackers or fruit Second Meal: fish sticks, fruit, roll, mustard Snack: fruit or Glucerna Third Meal: peanut butter and crackers OR chicken pie OR baked chicken tenders, green beans Snack: occasional Glucerna or apple or raisins Beverages: water, juice, sweetened tea, chocolate  milk, regular soda occasionally, coffee with creamer and sugar or splenda  Estimated Energy Needs Calories: 1700-1900 Protein: 50g  NUTRITION DIAGNOSIS  NB-1.1 Food and nutrition-related knowledge deficit As related to nutrition and CKD.  As evidenced by diet hx and patient report.   NUTRITION INTERVENTION  Nutrition education (E-1) on the following topics:  . Challenges controlling blood sugar with use of sugar containing beverages . Fluid restriction and current beverage intake.  Purpose of fluid restriction . Beverages related to protein and phosphorous intake.  Portion control. . Avoid salt substitutes that contain potassium as you are supplementing . Low sodium purpose and seasonings . Label reading for low sodium . Avoid foods that have phos... in the ingredient list (focus on beverages) . Balance of carbohydrates throughout the day . Mindfulness with snacking . Low protein recommendations without dialysis  Handouts Provided Include   NKD National kidney diet for those without dialysis  Low sodium nutrition therapy from [place] label  Learning Style & Readiness for Change Teaching method utilized: Visual & Auditory  Demonstrated degree of understanding via: Teach Back  Barriers to learning/adherence to lifestyle change: desire to change, physical limitations  Goals . Reduce sugar containing beverages . Balance meals . Mindfulness with snacks and avoid if not hungry . Avoid salt substitute and follow low sodium diet   MONITORING & EVALUATION Dietary intake, weekly physical activity, and label reading prn.  Next Steps  Patient is to follow up if desired.

## 2020-08-07 ENCOUNTER — Ambulatory Visit
Admission: RE | Admit: 2020-08-07 | Discharge: 2020-08-07 | Disposition: A | Discharge status: Home / Self Care | Payer: Medicare Other | Source: Ambulatory Visit | Attending: Family Medicine | Admitting: Family Medicine

## 2020-08-07 ENCOUNTER — Other Ambulatory Visit: Payer: Self-pay

## 2020-08-07 DIAGNOSIS — Z1231 Encounter for screening mammogram for malignant neoplasm of breast: Secondary | ICD-10-CM

## 2020-08-13 ENCOUNTER — Other Ambulatory Visit: Payer: Self-pay

## 2020-08-13 ENCOUNTER — Ambulatory Visit (INDEPENDENT_AMBULATORY_CARE_PROVIDER_SITE_OTHER): Payer: Medicare Other | Admitting: Endocrinology

## 2020-08-13 VITALS — BP 190/64 | HR 74 | Ht 59.5 in | Wt 218.4 lb

## 2020-08-13 DIAGNOSIS — Z794 Long term (current) use of insulin: Secondary | ICD-10-CM | POA: Diagnosis not present

## 2020-08-13 DIAGNOSIS — N184 Chronic kidney disease, stage 4 (severe): Secondary | ICD-10-CM

## 2020-08-13 DIAGNOSIS — E1122 Type 2 diabetes mellitus with diabetic chronic kidney disease: Secondary | ICD-10-CM | POA: Diagnosis not present

## 2020-08-13 MED ORDER — TRESIBA FLEXTOUCH 100 UNIT/ML ~~LOC~~ SOPN: 10.0000 [IU] | PEN_INJECTOR | Freq: Every day | SUBCUTANEOUS | 11 refills | Status: AC

## 2020-08-13 MED ORDER — VICTOZA 18 MG/3ML ~~LOC~~ SOPN: 0.6000 mg | PEN_INJECTOR | SUBCUTANEOUS | 3 refills | Status: DC
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
## 2020-08-13 NOTE — Patient Instructions (Addendum)
Your blood pressure is high today.  Please follow this up with your kidney doctor. Please reduce the Tresiba to 10 units per day. 2 days later, start Victoza 0.6 mg every morning.   check your blood sugar twice a day.  vary the time of day when you check, between before the 3 meals, and at bedtime.  also check if you have symptoms of your blood sugar being too high or too low.  please keep a record of the readings and bring it to your next appointment here (or you can bring the meter itself).  You can write it on any piece of paper.  please call us sooner if your blood sugar goes below 70, or if you have a lot of readings over 200.   Please come back for a follow-up appointment in 2 months.

## 2020-08-18 ENCOUNTER — Ambulatory Visit (INDEPENDENT_AMBULATORY_CARE_PROVIDER_SITE_OTHER): Payer: Medicare Other

## 2020-08-18 ENCOUNTER — Other Ambulatory Visit: Payer: Self-pay

## 2020-08-18 ENCOUNTER — Ambulatory Visit (INDEPENDENT_AMBULATORY_CARE_PROVIDER_SITE_OTHER): Payer: Medicare Other | Admitting: Pulmonary Disease

## 2020-08-18 ENCOUNTER — Encounter: Payer: Self-pay | Admitting: Pulmonary Disease

## 2020-08-18 VITALS — BP 119/70 | HR 72 | Temp 97.3°F | Ht <= 58 in | Wt 217.2 lb

## 2020-08-18 DIAGNOSIS — J4530 Mild persistent asthma, uncomplicated: Secondary | ICD-10-CM | POA: Diagnosis not present

## 2020-08-18 DIAGNOSIS — R0600 Dyspnea, unspecified: Secondary | ICD-10-CM | POA: Diagnosis not present

## 2020-08-18 DIAGNOSIS — I517 Cardiomegaly: Secondary | ICD-10-CM | POA: Diagnosis not present

## 2020-08-18 MED ORDER — MONTELUKAST SODIUM 10 MG PO TABS: 10.0000 mg | ORAL_TABLET | Freq: Every day | ORAL | 5 refills | Status: AC

## 2020-08-18 NOTE — Progress Notes (Addendum)
Amanda Cain    [id number]    10-26-1945  Primary Care Physician:Shaw, Nathen May, MD  Referring Physician: Mayra Neer, MD [facility] [employer] [place] [place],  [place] [id number]  Chief complaint:  Follow up for chronic cough, dyspnea.  Mild persistent asthma  HPI: 75 year old with history of hypertension, coronary artery disease, type 2 diabetes, chronic kidney disease, asthma She has recurrent admissions for respiratory failure secondary to heart failure, respiratory infection.  Her chief complaint is dyspnea with activity and occasionally at rest. She has productive cough with greenish sputum.  Initially on trelegy inhaler.  This was changed to [facility] as the diagnosis was asthma and not COPD.  - She had an admission on 11/20/16 for acute on chronic diastolic CHF. - Hospitalized for flu infection.  She was discharged on 07/08/17 after treatment with Tamiflu and prednisone taper. - Hospitalized for acute respiratory failure, community-acquired pneumonia, CHF on 7/16. Had a right heart cath with elevated left and right heart filling pressures, pulmonary hypertension.  She was diuresed with improvement in symptoms.  Discharged on a higher dose of torsemide - Been to the ED on 03/31/2018 with diarrhea, cough, CHF.  Symptoms improved with IV Lasix and duo nebs.  Chest x-ray showed vascular congestion, no pneumonia.  She has followed up with cardiology since then.   Pets:None Occupation:Clerical work, retired Exposures: None. No mold issues.  Smoking history:Never smoker  Interim history: Has chronic dyspnea on exertion which is worsening Lower extremity edema, continues on breo She is followed with nephrology as an outpatient and dialysis is being considered  Outpatient Encounter Medications as of 08/18/2020  Medication Sig  . acetaminophen (TYLENOL) 500 MG tablet Take 500 mg by mouth daily as needed for mild pain.   Marland Kitchen albuterol (PROVENTIL HFA;VENTOLIN HFA) 108 (90 Base)  MCG/ACT inhaler Inhale 2 puffs into the lungs every 4 (four) hours as needed for wheezing or shortness of breath.  Marland Kitchen atorvastatin (LIPITOR) 80 MG tablet Take 80 mg by mouth daily.  Marland Kitchen azelastine (ASTELIN) 0.1 % nasal spray Place 1 spray into both nostrils 2 (two) times daily. Use in each nostril as directed  . benzonatate (TESSALON) 100 MG capsule Take 100 mg by mouth 3 (three) times daily.  . Biotin 1000 MCG tablet Take 1,000 mcg by mouth daily.  . cholecalciferol (VITAMIN D) 1000 units tablet Take 1,000 Units by mouth daily.  Marland Kitchen diltiazem (CARDIZEM CD) 360 MG 24 hr capsule Take 360 mg by mouth daily.   Marland Kitchen escitalopram (LEXAPRO) 20 MG tablet Take 20 mg by mouth at bedtime.   Marland Kitchen estradiol (ESTRACE) 0.5 MG tablet Take 0.5 mg by mouth every morning.   . ezetimibe (ZETIA) 10 MG tablet Take 10 mg by mouth daily.  . fluticasone furoate-vilanterol (BREO ELLIPTA) 200-25 MCG/INH AEPB Inhale 1 puff into the lungs daily. INHALE 1 PUFF INTO THE LUNGS DAILY  . glucose blood (ONETOUCH VERIO) test strip 1 each by Other route 2 (two) times daily. and lancets 2/day  . guaiFENesin (MUCINEX) 600 MG 12 hr tablet Take 600 mg by mouth 2 (two) times daily.  . insulin degludec (TRESIBA FLEXTOUCH) 100 UNIT/ML FlexTouch Pen Inject 10 Units into the skin daily.  Marland Kitchen levothyroxine (SYNTHROID) 200 MCG tablet Take 200 mcg by mouth every morning.  . liraglutide (VICTOZA) 18 MG/3ML SOPN Inject 0.6 mg into the skin every morning.  Marland Kitchen LORazepam (ATIVAN) 0.5 MG tablet Take 0.5-1 tablets (0.25-0.5 mg total) by mouth See admin  instructions. 0.25 mg twice daily, 0.5 mg at bedtime  . metoprolol succinate (TOPROL-XL) 100 MG 24 hr tablet Take 100 mg by mouth every morning.   . montelukast (SINGULAIR) 10 MG tablet Take 1 tablet (10 mg total) by mouth at bedtime. TAKE 1 TABLET(10 MG) BY MOUTH AT BEDTIME  . Multiple Vitamin (MULITIVITAMIN WITH MINERALS) TABS Take 1 tablet by mouth daily.  . ondansetron (ZOFRAN-ODT) 4 MG disintegrating tablet  Take 4 mg by mouth every 8 (eight) hours as needed for nausea or vomiting.  . paliperidone (INVEGA) 3 MG 24 hr tablet Take 3 mg by mouth at bedtime.  . pantoprazole (PROTONIX) 40 MG tablet TAKE 1 TABLET(40 MG) BY MOUTH DAILY  . Polyethyl Glycol-Propyl Glycol 0.4-0.3 % SOLN Place 1 drop into both eyes daily as needed (for dry eyes).   . potassium chloride SA (KLOR-CON) 20 MEQ tablet Take 1 tablet (20 mEq total) by mouth 2 (two) times daily.  Marland Kitchen saccharomyces boulardii (FLORASTOR) 250 MG capsule Take 1 capsule (250 mg total) by mouth 2 (two) times daily.  . sucralfate (CARAFATE) 1 G tablet Take 1 g by mouth 2 (two) times daily.  . Sunscreens (BLISTEX COMPLETE MOISTURE EX) Apply 1 application topically as needed (dry, cracked lips).  . torsemide (DEMADEX) 100 MG tablet Take 100 mg by mouth daily.  . vitamin B-12 (CYANOCOBALAMIN) 1000 MCG tablet Take 1,000 mcg by mouth daily.  . vitamin C (ASCORBIC ACID) 500 MG tablet Take 500 mg by mouth daily.  . hydrALAZINE (APRESOLINE) 25 MG tablet Take 1 tablet (25 mg total) by mouth every 8 (eight) hours.   No facility-administered encounter medications on file as of 08/18/2020.    Allergies as of 08/18/2020 - Review Complete 08/18/2020  Allergen Reaction Noted  . Amlodipine  12/16/2019  . Aspirin Other (See Comments) 10/31/2012   Physical Exam: Blood pressure 119/70, pulse 72, temperature (!) 97.3 F (36.3 C), temperature source Temporal, height 4\' 10"  (1.473 m), weight 217 lb 3.2 oz (98.5 kg), SpO2 96 %. Gen:      No acute distress HEENT:  EOMI, sclera anicteric Neck:     No masses; no thyromegaly Lungs:    Clear to auscultation bilaterally; normal respiratory effort CV:         Regular rate and rhythm; no murmurs Abd:      + bowel sounds; soft, non-tender; no palpable masses, no distension Ext:    No edema; adequate peripheral perfusion Skin:      Warm and dry; no rash Neuro: alert and oriented x 3 Psych: normal mood and affect  Data  Reviewed: Imaging  Chest x-ray 11/17/16-CHF with interstitial edema CT neck 02/19/16-lung apices appear clear CT abdomen 11/19/11-lung bases appear clear. Chest x-ray 12/12/17- bibasilar atelectasis, basilar opacities.   Chest x-ray 03/31/2018- cardiomegaly, mild vascular congestion. I have reviewed the images personally.  PFTs 04/05/17-unable to complete FENO 02/27/17- unable to complete FENO 08/23/17-unable to complete  Cardiac Echo 11/17/16 LVEF 60 to 28%, grade 2 diastolic dysfunction, high ventricular filling pressures. - Left ventricle: The cavity size was normal. There was moderate  Echo 11/14/17- Mild LVH, LVEF 76-81%, grade 2 diastolic dysfunction  Right heart catheterization 12/15/2017 Moderately to severely elevated left and right heart filling pressures and pulmonary hypertension. Normal Fick cardiac output/index.  RA (mean): 20 mmHg RV (S/EDP): 68/20 mmHg PA (S/D, mean): 66/30 (42) mmHg PCWP (mean): 30 mmHg  Ao sat: 99% (vial pulseoximeter) PA sat: 63%  Fick CO: 5.4 L/min Fick CI: 2.7 L/min/m^2  Assessment:  Mild persistent asthma Suspect asthma, reactive airway disease exacerbated by allergies, postnasal drip and GERD We cannot confirm for sure as she is unable to complete, PFTs or FENO in spite of multiple attempts There is no suspicion for COPD as she is a non-smoker.  Continue Breo, albuterol as needed.  Pulmonary hypertension, diastolic heart failure, CKD Follow-up with cardiology and renal Suspect major issue with dyspnea is her heart failure and chronic kidney disease with multiple admissions for CHF Diuresis is limited by her kidney issues and she may get on dialysis later this year Chest x-ray today  Allergies, post nasal drip Continue Singulair, Flonase  GERD Protonix  Plan/Recommendations: - Continue Breo - Continue Singulair, Flonase, Protonix - Chest x-ray  Marshell Garfinkel MD [facility] 08/18/2020, 10:55 AM  CC:  Mayra Neer, MD

## 2020-08-18 NOTE — Patient Instructions (Addendum)
Continue the inhalers.  We will renew the Singulair You can take Mucinex and Claritin as needed for congestion and allergies We will get a chest x-ray Can follow-up in 6 months

## 2020-08-18 NOTE — Addendum Note (Signed)
Addended by: Merrilee Seashore on: 08/18/2020 11:15 AM   Modules accepted: Orders

## 2020-08-18 NOTE — Addendum Note (Signed)
Addended by: Merrilee Seashore on: 08/18/2020 11:16 AM   Modules accepted: Orders

## 2020-08-19 DIAGNOSIS — Z96659 Presence of unspecified artificial knee joint: Secondary | ICD-10-CM | POA: Diagnosis not present

## 2020-08-19 DIAGNOSIS — R269 Unspecified abnormalities of gait and mobility: Secondary | ICD-10-CM | POA: Diagnosis not present

## 2020-08-19 DIAGNOSIS — I5032 Chronic diastolic (congestive) heart failure: Secondary | ICD-10-CM | POA: Diagnosis not present

## 2020-08-24 DIAGNOSIS — M19012 Primary osteoarthritis, left shoulder: Secondary | ICD-10-CM | POA: Diagnosis not present

## 2020-08-27 ENCOUNTER — Encounter: Payer: Self-pay | Admitting: *Deleted

## 2020-08-31 ENCOUNTER — Telehealth: Payer: Self-pay | Admitting: Dietician

## 2020-08-31 NOTE — Telephone Encounter (Signed)
Brief nutrition note Returned patient call. Patients wants to know if she can have juice.  She states that she had it In the hospital and just wants to have orange juice, although is also asking about apple and grape juice. Discussed that all juice can increase her blood sugar.  If she chooses juice, limit to a 4 ounce serving and consider having instead of the fruit with a meal.  Discussed that her potassium is normal and therefore a small amount of orange juice may be acceptable but cranberry and apple juice may be better options. Also, discussed that diet cranberry juice would be acceptable in greater amounts.  Antonieta Iba, RD, LDN, CDCES

## 2020-09-02 DIAGNOSIS — M7918 Myalgia, other site: Secondary | ICD-10-CM | POA: Diagnosis not present

## 2020-09-02 DIAGNOSIS — E039 Hypothyroidism, unspecified: Secondary | ICD-10-CM | POA: Diagnosis not present

## 2020-09-02 DIAGNOSIS — L723 Sebaceous cyst: Secondary | ICD-10-CM | POA: Diagnosis not present

## 2020-09-11 DIAGNOSIS — R5381 Other malaise: Secondary | ICD-10-CM | POA: Diagnosis not present

## 2020-09-11 DIAGNOSIS — Z743 Need for continuous supervision: Secondary | ICD-10-CM | POA: Diagnosis not present

## 2020-09-11 DIAGNOSIS — R531 Weakness: Secondary | ICD-10-CM | POA: Diagnosis not present

## 2020-09-16 DIAGNOSIS — M12819 Other specific arthropathies, not elsewhere classified, unspecified shoulder: Secondary | ICD-10-CM | POA: Diagnosis not present

## 2020-09-16 DIAGNOSIS — I5030 Unspecified diastolic (congestive) heart failure: Secondary | ICD-10-CM | POA: Diagnosis not present

## 2020-09-16 DIAGNOSIS — E1122 Type 2 diabetes mellitus with diabetic chronic kidney disease: Secondary | ICD-10-CM | POA: Diagnosis not present

## 2020-09-16 DIAGNOSIS — K219 Gastro-esophageal reflux disease without esophagitis: Secondary | ICD-10-CM | POA: Diagnosis not present

## 2020-09-16 DIAGNOSIS — L723 Sebaceous cyst: Secondary | ICD-10-CM | POA: Diagnosis not present

## 2020-09-18 ENCOUNTER — Other Ambulatory Visit: Payer: Self-pay | Admitting: Interventional Cardiology

## 2020-09-19 DIAGNOSIS — I5032 Chronic diastolic (congestive) heart failure: Secondary | ICD-10-CM | POA: Diagnosis not present

## 2020-09-19 DIAGNOSIS — R269 Unspecified abnormalities of gait and mobility: Secondary | ICD-10-CM | POA: Diagnosis not present

## 2020-09-19 DIAGNOSIS — Z96659 Presence of unspecified artificial knee joint: Secondary | ICD-10-CM | POA: Diagnosis not present

## 2020-09-22 DIAGNOSIS — I5030 Unspecified diastolic (congestive) heart failure: Secondary | ICD-10-CM | POA: Diagnosis not present

## 2020-09-22 DIAGNOSIS — M25512 Pain in left shoulder: Secondary | ICD-10-CM | POA: Diagnosis not present

## 2020-09-22 DIAGNOSIS — N184 Chronic kidney disease, stage 4 (severe): Secondary | ICD-10-CM | POA: Diagnosis not present

## 2020-09-22 DIAGNOSIS — R2681 Unsteadiness on feet: Secondary | ICD-10-CM | POA: Diagnosis not present

## 2020-09-22 DIAGNOSIS — D649 Anemia, unspecified: Secondary | ICD-10-CM | POA: Diagnosis not present

## 2020-09-22 DIAGNOSIS — E782 Mixed hyperlipidemia: Secondary | ICD-10-CM | POA: Diagnosis not present

## 2020-09-22 DIAGNOSIS — I130 Hypertensive heart and chronic kidney disease with heart failure and stage 1 through stage 4 chronic kidney disease, or unspecified chronic kidney disease: Secondary | ICD-10-CM | POA: Diagnosis not present

## 2020-09-22 DIAGNOSIS — M1990 Unspecified osteoarthritis, unspecified site: Secondary | ICD-10-CM | POA: Diagnosis not present

## 2020-09-22 DIAGNOSIS — L723 Sebaceous cyst: Secondary | ICD-10-CM | POA: Diagnosis not present

## 2020-09-22 DIAGNOSIS — E1122 Type 2 diabetes mellitus with diabetic chronic kidney disease: Secondary | ICD-10-CM | POA: Diagnosis not present

## 2020-09-22 DIAGNOSIS — M12812 Other specific arthropathies, not elsewhere classified, left shoulder: Secondary | ICD-10-CM | POA: Diagnosis not present

## 2020-09-24 ENCOUNTER — Emergency Department (HOSPITAL_COMMUNITY): Payer: Medicare Other

## 2020-09-24 ENCOUNTER — Inpatient Hospital Stay (HOSPITAL_COMMUNITY)
Admission: EM | Admit: 2020-09-24 | Discharge: 2020-10-01 | DRG: 291 | Disposition: A | Discharge status: Skilled Nursing Facility | Payer: Medicare Other | Attending: Internal Medicine | Admitting: Internal Medicine

## 2020-09-24 ENCOUNTER — Telehealth: Payer: Self-pay | Admitting: Interventional Cardiology

## 2020-09-24 DIAGNOSIS — R269 Unspecified abnormalities of gait and mobility: Secondary | ICD-10-CM | POA: Diagnosis not present

## 2020-09-24 DIAGNOSIS — Z79899 Other long term (current) drug therapy: Secondary | ICD-10-CM

## 2020-09-24 DIAGNOSIS — I10 Essential (primary) hypertension: Secondary | ICD-10-CM | POA: Diagnosis present

## 2020-09-24 DIAGNOSIS — F32A Depression, unspecified: Secondary | ICD-10-CM | POA: Diagnosis present

## 2020-09-24 DIAGNOSIS — Z833 Family history of diabetes mellitus: Secondary | ICD-10-CM | POA: Diagnosis not present

## 2020-09-24 DIAGNOSIS — I160 Hypertensive urgency: Secondary | ICD-10-CM | POA: Diagnosis present

## 2020-09-24 DIAGNOSIS — I509 Heart failure, unspecified: Secondary | ICD-10-CM | POA: Diagnosis not present

## 2020-09-24 DIAGNOSIS — I517 Cardiomegaly: Secondary | ICD-10-CM | POA: Diagnosis not present

## 2020-09-24 DIAGNOSIS — Z888 Allergy status to other drugs, medicaments and biological substances status: Secondary | ICD-10-CM | POA: Diagnosis not present

## 2020-09-24 DIAGNOSIS — Z8041 Family history of malignant neoplasm of ovary: Secondary | ICD-10-CM | POA: Diagnosis not present

## 2020-09-24 DIAGNOSIS — Z886 Allergy status to analgesic agent status: Secondary | ICD-10-CM

## 2020-09-24 DIAGNOSIS — K219 Gastro-esophageal reflux disease without esophagitis: Secondary | ICD-10-CM | POA: Diagnosis present

## 2020-09-24 DIAGNOSIS — E871 Hypo-osmolality and hyponatremia: Secondary | ICD-10-CM | POA: Diagnosis present

## 2020-09-24 DIAGNOSIS — L723 Sebaceous cyst: Secondary | ICD-10-CM | POA: Diagnosis present

## 2020-09-24 DIAGNOSIS — N184 Chronic kidney disease, stage 4 (severe): Secondary | ICD-10-CM | POA: Diagnosis present

## 2020-09-24 DIAGNOSIS — N185 Chronic kidney disease, stage 5: Secondary | ICD-10-CM | POA: Diagnosis not present

## 2020-09-24 DIAGNOSIS — I272 Pulmonary hypertension, unspecified: Secondary | ICD-10-CM | POA: Diagnosis present

## 2020-09-24 DIAGNOSIS — F419 Anxiety disorder, unspecified: Secondary | ICD-10-CM | POA: Diagnosis present

## 2020-09-24 DIAGNOSIS — I132 Hypertensive heart and chronic kidney disease with heart failure and with stage 5 chronic kidney disease, or end stage renal disease: Secondary | ICD-10-CM | POA: Diagnosis not present

## 2020-09-24 DIAGNOSIS — Z20822 Contact with and (suspected) exposure to covid-19: Secondary | ICD-10-CM | POA: Diagnosis not present

## 2020-09-24 DIAGNOSIS — R600 Localized edema: Secondary | ICD-10-CM

## 2020-09-24 DIAGNOSIS — I5032 Chronic diastolic (congestive) heart failure: Secondary | ICD-10-CM | POA: Diagnosis not present

## 2020-09-24 DIAGNOSIS — Z8042 Family history of malignant neoplasm of prostate: Secondary | ICD-10-CM

## 2020-09-24 DIAGNOSIS — Z8249 Family history of ischemic heart disease and other diseases of the circulatory system: Secondary | ICD-10-CM | POA: Diagnosis not present

## 2020-09-24 DIAGNOSIS — Z820 Family history of epilepsy and other diseases of the nervous system: Secondary | ICD-10-CM | POA: Diagnosis not present

## 2020-09-24 DIAGNOSIS — Z794 Long term (current) use of insulin: Secondary | ICD-10-CM

## 2020-09-24 DIAGNOSIS — I5030 Unspecified diastolic (congestive) heart failure: Secondary | ICD-10-CM | POA: Diagnosis not present

## 2020-09-24 DIAGNOSIS — I130 Hypertensive heart and chronic kidney disease with heart failure and stage 1 through stage 4 chronic kidney disease, or unspecified chronic kidney disease: Secondary | ICD-10-CM | POA: Diagnosis not present

## 2020-09-24 DIAGNOSIS — R609 Edema, unspecified: Secondary | ICD-10-CM | POA: Diagnosis not present

## 2020-09-24 DIAGNOSIS — M12812 Other specific arthropathies, not elsewhere classified, left shoulder: Secondary | ICD-10-CM | POA: Diagnosis not present

## 2020-09-24 DIAGNOSIS — R279 Unspecified lack of coordination: Secondary | ICD-10-CM | POA: Diagnosis not present

## 2020-09-24 DIAGNOSIS — I5033 Acute on chronic diastolic (congestive) heart failure: Secondary | ICD-10-CM | POA: Diagnosis present

## 2020-09-24 DIAGNOSIS — Z79890 Hormone replacement therapy (postmenopausal): Secondary | ICD-10-CM

## 2020-09-24 DIAGNOSIS — I5031 Acute diastolic (congestive) heart failure: Secondary | ICD-10-CM

## 2020-09-24 DIAGNOSIS — Z6841 Body Mass Index (BMI) 40.0 and over, adult: Secondary | ICD-10-CM | POA: Diagnosis not present

## 2020-09-24 DIAGNOSIS — Z96651 Presence of right artificial knee joint: Secondary | ICD-10-CM | POA: Diagnosis present

## 2020-09-24 DIAGNOSIS — R2681 Unsteadiness on feet: Secondary | ICD-10-CM | POA: Diagnosis not present

## 2020-09-24 DIAGNOSIS — Z823 Family history of stroke: Secondary | ICD-10-CM | POA: Diagnosis not present

## 2020-09-24 DIAGNOSIS — E1122 Type 2 diabetes mellitus with diabetic chronic kidney disease: Secondary | ICD-10-CM

## 2020-09-24 DIAGNOSIS — E039 Hypothyroidism, unspecified: Secondary | ICD-10-CM | POA: Diagnosis not present

## 2020-09-24 DIAGNOSIS — R0600 Dyspnea, unspecified: Secondary | ICD-10-CM | POA: Diagnosis not present

## 2020-09-24 DIAGNOSIS — D649 Anemia, unspecified: Secondary | ICD-10-CM | POA: Diagnosis not present

## 2020-09-24 DIAGNOSIS — R296 Repeated falls: Secondary | ICD-10-CM | POA: Diagnosis not present

## 2020-09-24 DIAGNOSIS — F633 Trichotillomania: Secondary | ICD-10-CM | POA: Diagnosis present

## 2020-09-24 DIAGNOSIS — Z736 Limitation of activities due to disability: Secondary | ICD-10-CM | POA: Diagnosis not present

## 2020-09-24 DIAGNOSIS — M25512 Pain in left shoulder: Secondary | ICD-10-CM | POA: Diagnosis not present

## 2020-09-24 DIAGNOSIS — Z7951 Long term (current) use of inhaled steroids: Secondary | ICD-10-CM | POA: Diagnosis not present

## 2020-09-24 DIAGNOSIS — L089 Local infection of the skin and subcutaneous tissue, unspecified: Secondary | ICD-10-CM | POA: Diagnosis present

## 2020-09-24 DIAGNOSIS — R2689 Other abnormalities of gait and mobility: Secondary | ICD-10-CM | POA: Diagnosis not present

## 2020-09-24 DIAGNOSIS — E785 Hyperlipidemia, unspecified: Secondary | ICD-10-CM | POA: Diagnosis present

## 2020-09-24 DIAGNOSIS — E1140 Type 2 diabetes mellitus with diabetic neuropathy, unspecified: Secondary | ICD-10-CM | POA: Diagnosis present

## 2020-09-24 DIAGNOSIS — M6281 Muscle weakness (generalized): Secondary | ICD-10-CM | POA: Diagnosis not present

## 2020-09-24 DIAGNOSIS — R0902 Hypoxemia: Secondary | ICD-10-CM | POA: Diagnosis not present

## 2020-09-24 DIAGNOSIS — R0602 Shortness of breath: Secondary | ICD-10-CM | POA: Diagnosis not present

## 2020-09-24 DIAGNOSIS — Z743 Need for continuous supervision: Secondary | ICD-10-CM | POA: Diagnosis not present

## 2020-09-24 DIAGNOSIS — N183 Chronic kidney disease, stage 3 unspecified: Secondary | ICD-10-CM | POA: Diagnosis present

## 2020-09-24 DIAGNOSIS — J45909 Unspecified asthma, uncomplicated: Secondary | ICD-10-CM | POA: Diagnosis present

## 2020-09-24 DIAGNOSIS — M1990 Unspecified osteoarthritis, unspecified site: Secondary | ICD-10-CM | POA: Diagnosis present

## 2020-09-24 LAB — BASIC METABOLIC PANEL
Anion gap: 11 (ref 5–15)
BUN: 57 mg/dL — ABNORMAL HIGH (ref 8–23)
CO2: 26 mmol/L (ref 22–32)
Calcium: 9.1 mg/dL (ref 8.9–10.3)
Chloride: 95 mmol/L — ABNORMAL LOW (ref 98–111)
Creatinine, Ser: 2.44 mg/dL — ABNORMAL HIGH (ref 0.44–1.00)
GFR, Estimated: 20 mL/min — ABNORMAL LOW (ref >60)
Glucose, Bld: 79 mg/dL (ref 70–99)
Potassium: 4 mmol/L (ref 3.5–5.1)
Sodium: 132 mmol/L — ABNORMAL LOW (ref 135–145)

## 2020-09-24 LAB — CBC WITH DIFFERENTIAL/PLATELET
Abs Immature Granulocytes: 0.04 10*3/uL (ref 0.00–0.07)
Basophils Absolute: 0 10*3/uL (ref 0.0–0.1)
Basophils Relative: 0 %
Eosinophils Absolute: 0.3 10*3/uL (ref 0.0–0.5)
Eosinophils Relative: 3 %
HCT: 32.9 % — ABNORMAL LOW (ref 36.0–46.0)
Hemoglobin: 10.3 g/dL — ABNORMAL LOW (ref 12.0–15.0)
Immature Granulocytes: 0 %
Lymphocytes Relative: 13 %
Lymphs Abs: 1.3 10*3/uL (ref 0.7–4.0)
MCH: 28.2 pg (ref 26.0–34.0)
MCHC: 31.3 g/dL (ref 30.0–36.0)
MCV: 90.1 fL (ref 80.0–100.0)
Monocytes Absolute: 0.8 10*3/uL (ref 0.1–1.0)
Monocytes Relative: 8 %
Neutro Abs: 7.3 10*3/uL (ref 1.7–7.7)
Neutrophils Relative %: 76 %
Platelets: 349 10*3/uL (ref 150–400)
RBC: 3.65 MIL/uL — ABNORMAL LOW (ref 3.87–5.11)
RDW: 15.8 % — ABNORMAL HIGH (ref 11.5–15.5)
WBC: 9.8 10*3/uL (ref 4.0–10.5)
nRBC: 0 % (ref 0.0–0.2)

## 2020-09-24 LAB — BRAIN NATRIURETIC PEPTIDE: B Natriuretic Peptide: 74.8 pg/mL (ref 0.0–100.0)

## 2020-09-24 MED ORDER — FUROSEMIDE 10 MG/ML IJ SOLN
80.0000 mg | Freq: Once | INTRAMUSCULAR | Status: AC
  Administered 2020-09-24: 80 mg via INTRAVENOUS
  Filled 2020-09-24: qty 8
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
## 2020-09-24 NOTE — ED Notes (Signed)
Urine sample sent at this time.

## 2020-09-24 NOTE — Telephone Encounter (Signed)
Spoke with the OT who is currently with the patient. She has gained 4 lbs in two days. He reports that she does have some SOB with exertion but not more than her baseline. She does have swelling in her legs. She denies any increase in salt in her diet. She states that she is following her fluid restrictions, only drinking two bottles per day. She is taking torsemide 100 mg daily.  Patient was supposed to start on dailysis and diuretics were going to be followed by nephrology. The patient has not yet begun dialysis.  Her last BMET was 03/2020 with creatinine 2.76. Discussed with DOD, Dr. Radford Pax who recommends that the patient go to the ER for evaluation.

## 2020-09-24 NOTE — ED Notes (Signed)
Blood work obtained and sent to lab.

## 2020-09-24 NOTE — ED Triage Notes (Signed)
Pt presents via EMS from home for increased LE edema and weight gain of around 10lbs over the past 3-4 days. Per EMS, pt has a hx of CHF and diabetes. Pt denies any recent medication changes. She denies any CP or SOB.

## 2020-09-24 NOTE — Telephone Encounter (Signed)
  Amanda Cain, OT sent by Dr Laurann Montana and she is having weight gain. On 4/26 she weighed 214.8 and 09/24/20 weighs 218. Patient told him that she was to increase her Torseimde but he would like some instruction from the provider on parameters for her weight and dosage info.

## 2020-09-25 ENCOUNTER — Encounter (HOSPITAL_COMMUNITY): Payer: Self-pay | Admitting: Internal Medicine

## 2020-09-25 ENCOUNTER — Other Ambulatory Visit: Payer: Self-pay

## 2020-09-25 LAB — CBC
HCT: 34.8 % — ABNORMAL LOW (ref 36.0–46.0)
Hemoglobin: 10.9 g/dL — ABNORMAL LOW (ref 12.0–15.0)
MCH: 27.6 pg (ref 26.0–34.0)
MCHC: 31.3 g/dL (ref 30.0–36.0)
MCV: 88.1 fL (ref 80.0–100.0)
Platelets: 328 10*3/uL (ref 150–400)
RBC: 3.95 MIL/uL (ref 3.87–5.11)
RDW: 15.6 % — ABNORMAL HIGH (ref 11.5–15.5)
WBC: 9.4 10*3/uL (ref 4.0–10.5)
nRBC: 0 % (ref 0.0–0.2)

## 2020-09-25 LAB — CBC WITH DIFFERENTIAL/PLATELET
Abs Immature Granulocytes: 0.06 10*3/uL (ref 0.00–0.07)
Basophils Absolute: 0 10*3/uL (ref 0.0–0.1)
Basophils Relative: 0 %
Eosinophils Absolute: 0.3 10*3/uL (ref 0.0–0.5)
Eosinophils Relative: 3 %
HCT: 35.2 % — ABNORMAL LOW (ref 36.0–46.0)
Hemoglobin: 11 g/dL — ABNORMAL LOW (ref 12.0–15.0)
Immature Granulocytes: 1 %
Lymphocytes Relative: 12 %
Lymphs Abs: 1.2 10*3/uL (ref 0.7–4.0)
MCH: 27.9 pg (ref 26.0–34.0)
MCHC: 31.3 g/dL (ref 30.0–36.0)
MCV: 89.3 fL (ref 80.0–100.0)
Monocytes Absolute: 0.9 10*3/uL (ref 0.1–1.0)
Monocytes Relative: 9 %
Neutro Abs: 7.6 10*3/uL (ref 1.7–7.7)
Neutrophils Relative %: 75 %
Platelets: 362 10*3/uL (ref 150–400)
RBC: 3.94 MIL/uL (ref 3.87–5.11)
RDW: 15.7 % — ABNORMAL HIGH (ref 11.5–15.5)
WBC: 10.1 10*3/uL (ref 4.0–10.5)
nRBC: 0 % (ref 0.0–0.2)

## 2020-09-25 LAB — COMPREHENSIVE METABOLIC PANEL
ALT: 19 U/L (ref 0–44)
AST: 17 U/L (ref 15–41)
Albumin: 3.9 g/dL (ref 3.5–5.0)
Alkaline Phosphatase: 63 U/L (ref 38–126)
Anion gap: 11 (ref 5–15)
BUN: 54 mg/dL — ABNORMAL HIGH (ref 8–23)
CO2: 26 mmol/L (ref 22–32)
Calcium: 9.6 mg/dL (ref 8.9–10.3)
Chloride: 99 mmol/L (ref 98–111)
Creatinine, Ser: 2.45 mg/dL — ABNORMAL HIGH (ref 0.44–1.00)
GFR, Estimated: 20 mL/min — ABNORMAL LOW (ref >60)
Glucose, Bld: 72 mg/dL (ref 70–99)
Potassium: 3.7 mmol/L (ref 3.5–5.1)
Sodium: 136 mmol/L (ref 135–145)
Total Bilirubin: 0.4 mg/dL (ref 0.3–1.2)
Total Protein: 7.1 g/dL (ref 6.5–8.1)

## 2020-09-25 LAB — GLUCOSE, CAPILLARY
Glucose-Capillary: 73 mg/dL (ref 70–99)
Glucose-Capillary: 79 mg/dL (ref 70–99)
Glucose-Capillary: 120 mg/dL — ABNORMAL HIGH (ref 70–99)
Glucose-Capillary: 87 mg/dL (ref 70–99)

## 2020-09-25 LAB — TSH: TSH: 1.271 u[IU]/mL (ref 0.350–4.500)

## 2020-09-25 LAB — SARS CORONAVIRUS 2 (TAT 6-24 HRS): SARS Coronavirus 2: NEGATIVE

## 2020-09-25 LAB — D-DIMER, QUANTITATIVE: D-Dimer, Quant: 1.08 ug/mL-FEU — ABNORMAL HIGH (ref 0.00–0.50)

## 2020-09-25 LAB — MRSA PCR SCREENING: MRSA by PCR: NEGATIVE

## 2020-09-25 LAB — MAGNESIUM: Magnesium: 1.9 mg/dL (ref 1.7–2.4)

## 2020-09-25 MED ORDER — GABAPENTIN 100 MG PO CAPS
100.0000 mg | ORAL_CAPSULE | Freq: Two times a day (BID) | ORAL | Status: DC
  Administered 2020-09-25 – 2020-10-01 (×14): 100 mg via ORAL
  Filled 2020-09-25 (×15): qty 1

## 2020-09-25 MED ORDER — ACETAMINOPHEN 650 MG RE SUPP: 650.0000 mg | Freq: Four times a day (QID) | RECTAL | Status: DC | PRN

## 2020-09-25 MED ORDER — EZETIMIBE 10 MG PO TABS
10.0000 mg | ORAL_TABLET | Freq: Every day | ORAL | Status: DC
  Administered 2020-09-25 – 2020-10-01 (×7): 10 mg via ORAL
  Filled 2020-09-25 (×7): qty 1

## 2020-09-25 MED ORDER — PANTOPRAZOLE SODIUM 40 MG PO TBEC
40.0000 mg | DELAYED_RELEASE_TABLET | Freq: Every day | ORAL | Status: DC
  Administered 2020-09-25 – 2020-10-01 (×7): 40 mg via ORAL
  Filled 2020-09-25 (×7): qty 1

## 2020-09-25 MED ORDER — SUCRALFATE 1 G PO TABS
1.0000 g | ORAL_TABLET | Freq: Every day | ORAL | Status: DC
  Administered 2020-09-25 – 2020-10-01 (×7): 1 g via ORAL
  Filled 2020-09-25 (×7): qty 1

## 2020-09-25 MED ORDER — HYDRALAZINE HCL 25 MG PO TABS: 25.0000 mg | ORAL_TABLET | Freq: Three times a day (TID) | ORAL | Status: DC

## 2020-09-25 MED ORDER — ACETAMINOPHEN 325 MG PO TABS
650.0000 mg | ORAL_TABLET | Freq: Four times a day (QID) | ORAL | Status: DC | PRN
  Administered 2020-09-25 – 2020-09-27 (×3): 650 mg via ORAL
  Filled 2020-09-25 (×3): qty 2

## 2020-09-25 MED ORDER — FLUTICASONE FUROATE-VILANTEROL 200-25 MCG/INH IN AEPB
1.0000 | INHALATION_SPRAY | Freq: Every day | RESPIRATORY_TRACT | Status: DC
  Administered 2020-09-25 – 2020-10-01 (×7): 1 via RESPIRATORY_TRACT
  Filled 2020-09-25 (×2): qty 28

## 2020-09-25 MED ORDER — INSULIN DEGLUDEC 100 UNIT/ML ~~LOC~~ SOPN: 10.0000 [IU] | PEN_INJECTOR | Freq: Every day | SUBCUTANEOUS | Status: DC

## 2020-09-25 MED ORDER — GUAIFENESIN ER 600 MG PO TB12
600.0000 mg | ORAL_TABLET | Freq: Every day | ORAL | Status: DC
  Administered 2020-09-25 – 2020-10-01 (×7): 600 mg via ORAL
  Filled 2020-09-25 (×7): qty 1

## 2020-09-25 MED ORDER — DILTIAZEM HCL ER COATED BEADS 180 MG PO CP24
360.0000 mg | ORAL_CAPSULE | Freq: Every day | ORAL | Status: DC
  Administered 2020-09-25 – 2020-10-01 (×7): 360 mg via ORAL
  Filled 2020-09-25 (×7): qty 2

## 2020-09-25 MED ORDER — VITAMIN D 25 MCG (1000 UNIT) PO TABS
1000.0000 [IU] | ORAL_TABLET | Freq: Every day | ORAL | Status: DC
  Administered 2020-09-25 – 2020-10-01 (×7): 1000 [IU] via ORAL
  Filled 2020-09-25 (×7): qty 1

## 2020-09-25 MED ORDER — TRAMADOL HCL 50 MG PO TABS
50.0000 mg | ORAL_TABLET | Freq: Two times a day (BID) | ORAL | Status: DC
Start: 2020-09-25 — End: 2020-10-01
  Administered 2020-09-25 – 2020-10-01 (×12): 50 mg via ORAL
  Filled 2020-09-25 (×12): qty 1

## 2020-09-25 MED ORDER — LORAZEPAM 0.5 MG PO TABS
0.5000 mg | ORAL_TABLET | Freq: Every day | ORAL | Status: DC
  Administered 2020-09-25 – 2020-09-28 (×4): 0.5 mg via ORAL
  Filled 2020-09-25 (×5): qty 1

## 2020-09-25 MED ORDER — POTASSIUM CHLORIDE CRYS ER 20 MEQ PO TBCR
20.0000 meq | EXTENDED_RELEASE_TABLET | Freq: Two times a day (BID) | ORAL | Status: DC
  Administered 2020-09-25 – 2020-09-29 (×10): 20 meq via ORAL
  Filled 2020-09-25 (×10): qty 1

## 2020-09-25 MED ORDER — SACCHAROMYCES BOULARDII 250 MG PO CAPS
250.0000 mg | ORAL_CAPSULE | Freq: Two times a day (BID) | ORAL | Status: DC
  Administered 2020-09-25 – 2020-10-01 (×13): 250 mg via ORAL
  Filled 2020-09-25 (×13): qty 1

## 2020-09-25 MED ORDER — LEVOTHYROXINE SODIUM 100 MCG PO TABS
200.0000 ug | ORAL_TABLET | Freq: Every day | ORAL | Status: DC
  Administered 2020-09-25 – 2020-10-01 (×7): 200 ug via ORAL
  Filled 2020-09-25 (×7): qty 2

## 2020-09-25 MED ORDER — ENOXAPARIN SODIUM 30 MG/0.3ML IJ SOSY
30.0000 mg | PREFILLED_SYRINGE | INTRAMUSCULAR | Status: DC
  Administered 2020-09-25 – 2020-10-01 (×7): 30 mg via SUBCUTANEOUS
  Filled 2020-09-25 (×7): qty 0.3

## 2020-09-25 MED ORDER — FUROSEMIDE 10 MG/ML IJ SOLN
80.0000 mg | Freq: Two times a day (BID) | INTRAMUSCULAR | Status: DC
  Administered 2020-09-25 – 2020-09-26 (×3): 80 mg via INTRAVENOUS
  Filled 2020-09-25 (×3): qty 8

## 2020-09-25 MED ORDER — MONTELUKAST SODIUM 10 MG PO TABS
10.0000 mg | ORAL_TABLET | Freq: Every day | ORAL | Status: DC
  Administered 2020-09-25 – 2020-09-30 (×6): 10 mg via ORAL
  Filled 2020-09-25 (×6): qty 1

## 2020-09-25 MED ORDER — HYDRALAZINE HCL 20 MG/ML IJ SOLN
10.0000 mg | INTRAMUSCULAR | Status: DC | PRN
  Administered 2020-09-25: 10 mg via INTRAVENOUS
  Filled 2020-09-25: qty 1

## 2020-09-25 MED ORDER — INSULIN GLARGINE 100 UNIT/ML ~~LOC~~ SOLN
10.0000 [IU] | Freq: Every day | SUBCUTANEOUS | Status: DC
  Administered 2020-09-25 – 2020-10-01 (×7): 10 [IU] via SUBCUTANEOUS
  Filled 2020-09-25 (×7): qty 0.1

## 2020-09-25 MED ORDER — PALIPERIDONE ER 3 MG PO TB24
3.0000 mg | ORAL_TABLET | Freq: Every day | ORAL | Status: DC
  Administered 2020-09-25 – 2020-09-30 (×6): 3 mg via ORAL
  Filled 2020-09-25 (×7): qty 1

## 2020-09-25 MED ORDER — VITAMIN B-12 1000 MCG PO TABS
1000.0000 ug | ORAL_TABLET | Freq: Every day | ORAL | Status: DC
  Administered 2020-09-25 – 2020-10-01 (×7): 1000 ug via ORAL
  Filled 2020-09-25 (×7): qty 1

## 2020-09-25 MED ORDER — ALBUTEROL SULFATE HFA 108 (90 BASE) MCG/ACT IN AERS
2.0000 | INHALATION_SPRAY | RESPIRATORY_TRACT | Status: DC | PRN
  Administered 2020-09-26 – 2020-09-28 (×2): 2 via RESPIRATORY_TRACT
  Filled 2020-09-25: qty 6.7

## 2020-09-25 MED ORDER — HYDRALAZINE HCL 25 MG PO TABS
25.0000 mg | ORAL_TABLET | Freq: Three times a day (TID) | ORAL | Status: DC
  Administered 2020-09-25 – 2020-10-01 (×19): 25 mg via ORAL
  Filled 2020-09-25 (×20): qty 1

## 2020-09-25 MED ORDER — INSULIN ASPART 100 UNIT/ML IJ SOLN
0.0000 [IU] | Freq: Three times a day (TID) | INTRAMUSCULAR | Status: DC
  Administered 2020-09-26: 1 [IU] via SUBCUTANEOUS
  Administered 2020-09-27: 2 [IU] via SUBCUTANEOUS
  Administered 2020-09-27 – 2020-09-29 (×3): 1 [IU] via SUBCUTANEOUS
  Administered 2020-09-30: 2 [IU] via SUBCUTANEOUS
  Administered 2020-09-30: 1 [IU] via SUBCUTANEOUS
  Filled 2020-09-25: qty 0.09

## 2020-09-25 MED ORDER — GABAPENTIN 100 MG PO CAPS: 100.0000 mg | ORAL_CAPSULE | Freq: Two times a day (BID) | ORAL | Status: DC

## 2020-09-25 MED ORDER — ESCITALOPRAM OXALATE 10 MG PO TABS
20.0000 mg | ORAL_TABLET | Freq: Every day | ORAL | Status: DC
  Administered 2020-09-25 – 2020-09-30 (×6): 20 mg via ORAL
  Filled 2020-09-25 (×6): qty 2

## 2020-09-25 MED ORDER — METOPROLOL SUCCINATE ER 100 MG PO TB24
100.0000 mg | ORAL_TABLET | Freq: Every morning | ORAL | Status: DC
  Administered 2020-09-25 – 2020-09-26 (×2): 100 mg via ORAL
  Filled 2020-09-25: qty 1

## 2020-09-25 MED ORDER — ATORVASTATIN CALCIUM 80 MG PO TABS
80.0000 mg | ORAL_TABLET | Freq: Every day | ORAL | Status: DC
  Administered 2020-09-25 – 2020-10-01 (×7): 80 mg via ORAL
  Filled 2020-09-25 (×7): qty 1

## 2020-09-25 NOTE — Progress Notes (Signed)
Complained of left shoulder pain due to arthritis,md aware. Tylenol given. Continue to monitor.

## 2020-09-25 NOTE — Progress Notes (Signed)
Pt arrived to unit. Vitals taken and pt orientated to the room.

## 2020-09-25 NOTE — ED Notes (Signed)
[facility] picking up patient and taking to [facility].

## 2020-09-25 NOTE — Progress Notes (Signed)
Patient seen and examined and agree with plan of care as per my partner Dr. Hal Hope who saw the patient earlier this morning  75 year old community dwelling black female HFpEF complicated by pulmonary HTN [WHO ii/iii?] -follows with Dr. Irish Lack R/L cath 2019 = elevated L/R filling pressures-known dry weight 221 pounds CKD 4+ anemia renal disease DM TY 2 nephropathy neuropathy HLD HTN Hypothyroid BMI 42 Depression   3-4 d LLE, DOE , 10 lb wght gain--in Ed Rx Lasix 80 once--Hydralazine resumed  Labs at baseline Anemia renal dx  BUn /creat 57/2.44-->54/2.45 Hb 10.3-->11.0 WBC 10.1 BNP 74   Awake alet pleasat States has been "drinking more" than her usual--she was told she could have 2 bottlesand was told by another she could have "3 or 4"  P MDRF equation>CK-GFR inaccurate typically in BMI>40? Diurese--inform Dr. Myles Gip protein [Tissue fluid?]--ambulate-TED hose  She is below her Dry weight at 211 pounds  Expect will need IP level care ~ 72 hours to diurese  Verneita Griffes, MD [facility] 12:00 PM
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
## 2020-09-25 NOTE — TOC Initial Note (Signed)
Transition of Care [facility]) - Initial/Assessment Note    Patient Details  Name: Amanda Cain MRN: [id number] Date of Birth: 10-06-1945  Transition [facility] CM/SW Contact:    Verdell Carmine, RN Phone Number: 09/25/2020, 8:14 AM  Clinical Narrative:                 75 YO admitted with heart failure, sees Dr Irish Lack for cardilogy. She has PT and OT servcies, as well as aides, but feels she needs more aide coverage, as it is only until 1 currently. She has Medicaid and Medicare, and has been talking with Maudie Mercury at [facility] to increase services. Has a sister that lives nearby that takes her to appointments.  Plan to initialize PCS for increased services. Speak to Kim at [facility].   Expected Discharge Plan: [place] Barriers to Discharge: Continued Medical Work up   Patient Goals and CMS Choice        Expected Discharge Plan and Services Expected Discharge Plan: [place]   Discharge Planning Services: CM Consult   Living arrangements for the past 2 months: Apartment                                      Prior Living Arrangements/Services Living arrangements for the past 2 months: Apartment Lives with:: Self Patient language and need for interpreter reviewed:: Yes        Need for Family Participation in Patient Care: Yes (Comment) Care giver support system in place?: Yes (comment)   Criminal Activity/Legal Involvement Pertinent to Current Situation/Hospitalization: No - Comment as needed  Activities of Daily Living Home Assistive Devices/Equipment: Walker (specify type) ADL Screening (condition at time of admission) Patient's cognitive ability adequate to safely complete daily activities?: Yes Is the patient deaf or have difficulty hearing?: No Does the patient have difficulty seeing, even when wearing glasses/contacts?: No Does the patient have difficulty concentrating, remembering, or making decisions?: No Patient able to  express need for assistance with ADLs?: Yes Does the patient have difficulty dressing or bathing?: No Independently performs ADLs?: Yes (appropriate for developmental age) Does the patient have difficulty walking or climbing stairs?: Yes Weakness of Legs: Both Weakness of Arms/Hands: None  Permission Sought/Granted                  Emotional Assessment       Orientation: : Oriented to Place,Oriented to  Time,Oriented to Situation,Oriented to Self Alcohol / Substance Use: Not Applicable Psych Involvement: No (comment)  Admission diagnosis:  CHF (congestive heart failure) ([place]) [I50.9] Peripheral edema [R60.9] Pulmonary hypertension (HCC) [I27.20] Acute CHF (congestive heart failure) ([place]) [Y50.3] Acute diastolic congestive heart failure ([place]) [I50.31] Stage 4 chronic kidney disease ([place]) [N18.4] Patient Active Problem List   Diagnosis Date Noted  . Acute CHF (congestive heart failure) ([place]) 09/24/2020  . Acute exacerbation of CHF (congestive heart failure) ([place]) 04/16/2020  . Acute on chronic diastolic (congestive) heart failure ([place]) 02/20/2020  . Hypokalemia   . Anxiety   . Physical deconditioning 03/13/2019  . Acute recurrent maxillary sinusitis 03/13/2019  . Pain due to onychomycosis of toenails of both feet 11/21/2018  . Shortness of breath 08/09/2018  . Asthma 06/19/2018  . Chronic respiratory failure with hypoxia ([place]) 05/15/2018  . Pulmonary hypertension ([place]) 05/14/2018  . Atypical chest pain 05/12/2018  . Community acquired pneumonia 12/13/2017  . CAP (community  acquired pneumonia) 12/13/2017  . CHF (congestive heart failure) ([place]) 12/12/2017  . Morbid obesity ([place]) 11/21/2017  . Flu 07/07/2017  . Influenza A 07/05/2017  . Acute respiratory failure with hypoxia ([place]) 07/05/2017  . Ascites   . Acute on chronic diastolic heart failure ([place]) 11/17/2016  . CKD (chronic kidney disease), stage III ([place]) 11/17/2016  . S/P laparoscopic cholecystectomy 03/02/2015   . Hyperlipidemia 11/25/2012  . Depression 11/25/2012  . GERD (gastroesophageal reflux disease) 11/25/2012  . Postoperative anemia due to acute blood loss 11/01/2012  . Osteoarthritis of right knee 10/31/2012  . Hyperglycemia 11/30/2011  . SBP (spontaneous bacterial peritonitis) ([place]) 11/25/2011    Class: Acute  . Syncope and collapse 11/20/2011  . Abdominal pain 11/19/2011  . Hyponatremia 11/19/2011  . Anemia 11/19/2011  . Acute kidney injury superimposed on CKD ([place]) 11/19/2011  . Moderate protein-calorie malnutrition ([place]) 11/19/2011  . Essential hypertension 11/19/2011  . Hypothyroidism, acquired 11/19/2011  . Dyslipidemia 11/19/2011  . Diabetes ([place]) 07/15/2011   PCP:  Mayra Neer, MD Pharmacy:   [facility] [place], [place] - [address] [place] [address] Phone: [phone] Fax: [phone]     Social Determinants of Health (SDOH) Interventions    Readmission Risk Interventions Readmission Risk Prevention Plan 02/26/2020  Transportation Screening Complete  Medication Review ([place]) Complete  PCP or Specialist appointment within 3-5 days of discharge Complete  SW Recovery Care/Counseling Consult Complete  [place] Patient Refused  Some recent data might be hidden

## 2020-09-25 NOTE — Hospital Course (Addendum)
  Sodium 129 potassium 5.0  CO2 23 BUNs/creatinine-->57/2.4-->70/3.9---> 71/3.4 Hemoglobin 10.0-->9.4 White count 7.7  CBG 110-161 Net volumes +60 cc since admission

## 2020-09-26 LAB — COMPREHENSIVE METABOLIC PANEL
ALT: 17 U/L (ref 0–44)
AST: 15 U/L (ref 15–41)
Albumin: 3.2 g/dL — ABNORMAL LOW (ref 3.5–5.0)
Alkaline Phosphatase: 54 U/L (ref 38–126)
Anion gap: 9 (ref 5–15)
BUN: 59 mg/dL — ABNORMAL HIGH (ref 8–23)
CO2: 28 mmol/L (ref 22–32)
Calcium: 8.9 mg/dL (ref 8.9–10.3)
Chloride: 99 mmol/L (ref 98–111)
Creatinine, Ser: 3.12 mg/dL — ABNORMAL HIGH (ref 0.44–1.00)
GFR, Estimated: 15 mL/min — ABNORMAL LOW (ref >60)
Glucose, Bld: 118 mg/dL — ABNORMAL HIGH (ref 70–99)
Potassium: 3.7 mmol/L (ref 3.5–5.1)
Sodium: 136 mmol/L (ref 135–145)
Total Bilirubin: 0.4 mg/dL (ref 0.3–1.2)
Total Protein: 6.3 g/dL — ABNORMAL LOW (ref 6.5–8.1)

## 2020-09-26 LAB — GLUCOSE, CAPILLARY
Glucose-Capillary: 84 mg/dL (ref 70–99)
Glucose-Capillary: 102 mg/dL — ABNORMAL HIGH (ref 70–99)
Glucose-Capillary: 124 mg/dL — ABNORMAL HIGH (ref 70–99)
Glucose-Capillary: 149 mg/dL — ABNORMAL HIGH (ref 70–99)

## 2020-09-26 MED ORDER — DOXYCYCLINE HYCLATE 100 MG PO TABS
100.0000 mg | ORAL_TABLET | Freq: Two times a day (BID) | ORAL | Status: DC
  Administered 2020-09-26 – 2020-10-01 (×11): 100 mg via ORAL
  Filled 2020-09-26 (×11): qty 1

## 2020-09-26 MED ORDER — METOPROLOL SUCCINATE ER 50 MG PO TB24
50.0000 mg | ORAL_TABLET | Freq: Every morning | ORAL | Status: DC
  Administered 2020-09-27 – 2020-10-01 (×5): 50 mg via ORAL
  Filled 2020-09-26 (×5): qty 1

## 2020-09-26 NOTE — Progress Notes (Signed)
Noted with smal cyst to right upper back. Pt claimed been there prior to adm. Noted with purulent discharge in small amount  Cleansed with saline , foam dressing applied. MD made aware.

## 2020-09-26 NOTE — Plan of Care (Signed)

## 2020-09-26 NOTE — Plan of Care (Signed)

## 2020-09-26 NOTE — Progress Notes (Signed)
Refused to be assisted back to bed. Prefers to be in a chair.

## 2020-09-26 NOTE — Progress Notes (Signed)
PROGRESS NOTE   Amanda Cain  HAL:[id number] DOB: 1945/09/01 DOA: 09/24/2020 PCP: Mayra Neer, MD  Brief Narrative:  75 year old community dwelling black female HFpEF complicated by pulmonary HTN [WHO ii/iii?] -follows with Dr. Irish Lack R/L cath 2019 = elevated L/R filling pressures-known dry weight 221 pounds CKD 4+ anemia renal disease DM TY 2 nephropathy neuropathy HLD HTN Hypothyroid BMI 42 Depression   3-4 d LLE, DOE , 10 lb wght gain--in Ed Rx Lasix 80 once--Hydralazine resumed   Admits to fluid indiscretion and taking her diet-has been told different things by different physicians apparently  Hospital-Problem based course  Acute decompensated HFpEF/who grade 2 pulmonary hypertension Cath 2019 elevated pressures dry weight 221 Weight currently 209 pounds Creatinine has bumped precluding further aggressive diuresis  Hold diuretics today- Recheck a.m. labs Cut back dose Toprol-XL-continue Cardizem 360 continue hydralazine 25 3 times daily CKD 4 with anemia of renal disease See above discussion Hemoglobin 10.9 Outpatient iron studies Inflamed sebaceous cyst status post treatment in outpatient setting Has started oozing again today Start doxycycline and review wound in a.m. Will probably need general surgery input in the outpatient setting-patient did not pick up the repeat antibiotics that were prescribed DM TY 2 nephropathy neuropathy CBG 84-1 02 eating 90% of meals Continue Lantus 10 and sliding scale Continue gabapentin 100 twice daily Severe reflux Continue Carafate 1 g daily, Protonix 40 daily Trichotillomania/depression Continue Invega 3 at bedtime, lorazepam 0.5 at bedtime, Lexapro 20 daily  DVT prophylaxis: Lovenox Code Status: Full Family Communication:  Disposition:  Status is: Inpatient  Remains inpatient appropriate because:Persistent severe electrolyte disturbances, Ongoing diagnostic testing needed not appropriate for outpatient work  up and Unsafe d/c plan   Dispo: The patient is from: Home              Anticipated d/c is to: Home              Patient currently is not medically stable to d/c.   Difficult to place patient No       Consultants:   None yet  Procedures:   Antimicrobials:  Doxycycline   Subjective: Awake coherent no distress Has some oozing from her upper right back where she has what looks like a sebaceous cyst No chest pain no fever No lower extremity swelling Feels thirsty  Objective: Vitals:   09/26/20 0724 09/26/20 0742 09/26/20 0858 09/26/20 1128  BP:   127/60 129/62  Pulse:   78 (!) 53  Resp:    20  Temp: 98.6 F (37 C)   (!) 97.5 F (36.4 C)  TempSrc: Oral   Oral  SpO2:  95%  93%  Weight:      Height:        Intake/Output Summary (Last 24 hours) at 09/26/2020 1154 Last data filed at 09/26/2020 0700 Gross per 24 hour  Intake 640 ml  Output 1350 ml  Net -710 ml   Filed Weights   09/24/20 1759 09/25/20 0511 09/26/20 0620  Weight: 98.9 kg 96.3 kg 94.9 kg    Examination:  Awake coherent no distress EOMI NCAT no focal deficit CTA B no added sound S1-S2 no murmurs-sinus on monitors Chest clear no added sound Neurologically intact no focal deficit   Data Reviewed: personally reviewed   CBC    Component Value Date/Time   WBC 9.4 09/25/2020 0712   RBC 3.95 09/25/2020 0712   HGB 10.9 (L) 09/25/2020 0712   HCT 34.8 (L) 09/25/2020 0712   PLT 328 09/25/2020 7353  MCV 88.1 09/25/2020 0712   MCH 27.6 09/25/2020 0712   MCHC 31.3 09/25/2020 0712   RDW 15.6 (H) 09/25/2020 0712   LYMPHSABS 1.2 09/25/2020 0354   MONOABS 0.9 09/25/2020 0354   EOSABS 0.3 09/25/2020 0354   BASOSABS 0.0 09/25/2020 0354   CMP Latest Ref Rng & Units 09/26/2020 09/25/2020 09/24/2020  Glucose 70 - 99 mg/dL 118(H) 72 79  BUN 8 - 23 mg/dL 59(H) 54(H) 57(H)  Creatinine 0.44 - 1.00 mg/dL 3.12(H) 2.45(H) 2.44(H)  Sodium 135 - 145 mmol/L 136 136 132(L)  Potassium 3.5 - 5.1 mmol/L 3.7 3.7 4.0   Chloride 98 - 111 mmol/L 99 99 95(L)  CO2 22 - 32 mmol/L 28 26 26   Calcium 8.9 - 10.3 mg/dL 8.9 9.6 9.1  Total Protein 6.5 - 8.1 g/dL 6.3(L) 7.1 -  Total Bilirubin 0.3 - 1.2 mg/dL 0.4 0.4 -  Alkaline Phos 38 - 126 U/L 54 63 -  AST 15 - 41 U/L 15 17 -  ALT 0 - 44 U/L 17 19 -     Radiology Studies: DG Chest Port 1 View  Result Date: 09/24/2020 CLINICAL DATA:  Increased lower extremity edema and weight gain of about 10 pounds over the past 4 days. History of congestive heart failure and diabetes. EXAM: PORTABLE CHEST 1 VIEW COMPARISON:  08/18/2020 FINDINGS: Mild cardiac enlargement. No vascular congestion, edema, or consolidation in the lungs. No pleural effusions. No pneumothorax. Mediastinal contours appear intact. IMPRESSION: Mild cardiac enlargement. Electronically Signed   By: Lucienne Capers M.D.   On: 09/24/2020 21:47     Scheduled Meds: . atorvastatin  80 mg Oral Daily  . cholecalciferol  1,000 Units Oral Daily  . diltiazem  360 mg Oral Daily  . doxycycline  100 mg Oral Q12H  . enoxaparin (LOVENOX) injection  30 mg Subcutaneous Q24H  . escitalopram  20 mg Oral QHS  . ezetimibe  10 mg Oral Daily  . fluticasone furoate-vilanterol  1 puff Inhalation Daily  . gabapentin  100 mg Oral BID  . guaiFENesin  600 mg Oral Daily  . hydrALAZINE  25 mg Oral Q8H  . insulin aspart  0-9 Units Subcutaneous TID WC  . insulin glargine  10 Units Subcutaneous Daily  . levothyroxine  200 mcg Oral Q0600  . LORazepam  0.5 mg Oral QHS  . [START ON 09/27/2020] metoprolol succinate  50 mg Oral q morning  . montelukast  10 mg Oral QHS  . paliperidone  3 mg Oral QHS  . pantoprazole  40 mg Oral Daily  . potassium chloride SA  20 mEq Oral BID  . saccharomyces boulardii  250 mg Oral BID  . sucralfate  1 g Oral QPC breakfast  . traMADol  50 mg Oral Q12H  . vitamin B-12  1,000 mcg Oral Daily   Continuous Infusions:   LOS: 2 days   Time spent: 27 minutes  Nita Sells, MD [facility] To contact the attending provider between 7A-7P or the covering provider during after hours 7P-7A, please log into the web site [email or web address] and access using universal [place] password for that web site. If you do not have the password, please call the hospital operator.  09/26/2020, 11:54 AM

## 2020-09-27 LAB — CBC WITH DIFFERENTIAL/PLATELET
Abs Immature Granulocytes: 0.03 10*3/uL (ref 0.00–0.07)
Basophils Absolute: 0 10*3/uL (ref 0.0–0.1)
Basophils Relative: 0 %
Eosinophils Absolute: 0.3 10*3/uL (ref 0.0–0.5)
Eosinophils Relative: 3 %
HCT: 30.9 % — ABNORMAL LOW (ref 36.0–46.0)
Hemoglobin: 9.7 g/dL — ABNORMAL LOW (ref 12.0–15.0)
Immature Granulocytes: 0 %
Lymphocytes Relative: 14 %
Lymphs Abs: 1.1 10*3/uL (ref 0.7–4.0)
MCH: 28 pg (ref 26.0–34.0)
MCHC: 31.4 g/dL (ref 30.0–36.0)
MCV: 89.3 fL (ref 80.0–100.0)
Monocytes Absolute: 0.8 10*3/uL (ref 0.1–1.0)
Monocytes Relative: 9 %
Neutro Abs: 5.9 10*3/uL (ref 1.7–7.7)
Neutrophils Relative %: 74 %
Platelets: 342 10*3/uL (ref 150–400)
RBC: 3.46 MIL/uL — ABNORMAL LOW (ref 3.87–5.11)
RDW: 16 % — ABNORMAL HIGH (ref 11.5–15.5)
WBC: 8.1 10*3/uL (ref 4.0–10.5)
nRBC: 0 % (ref 0.0–0.2)

## 2020-09-27 LAB — COMPREHENSIVE METABOLIC PANEL
ALT: 18 U/L (ref 0–44)
AST: 15 U/L (ref 15–41)
Albumin: 3 g/dL — ABNORMAL LOW (ref 3.5–5.0)
Alkaline Phosphatase: 54 U/L (ref 38–126)
Anion gap: 9 (ref 5–15)
BUN: 64 mg/dL — ABNORMAL HIGH (ref 8–23)
CO2: 26 mmol/L (ref 22–32)
Calcium: 8.5 mg/dL — ABNORMAL LOW (ref 8.9–10.3)
Chloride: 95 mmol/L — ABNORMAL LOW (ref 98–111)
Creatinine, Ser: 3.36 mg/dL — ABNORMAL HIGH (ref 0.44–1.00)
GFR, Estimated: 14 mL/min — ABNORMAL LOW (ref >60)
Glucose, Bld: 113 mg/dL — ABNORMAL HIGH (ref 70–99)
Potassium: 4.3 mmol/L (ref 3.5–5.1)
Sodium: 130 mmol/L — ABNORMAL LOW (ref 135–145)
Total Bilirubin: 0.8 mg/dL (ref 0.3–1.2)
Total Protein: 5.8 g/dL — ABNORMAL LOW (ref 6.5–8.1)

## 2020-09-27 LAB — GLUCOSE, CAPILLARY
Glucose-Capillary: 119 mg/dL — ABNORMAL HIGH (ref 70–99)
Glucose-Capillary: 157 mg/dL — ABNORMAL HIGH (ref 70–99)
Glucose-Capillary: 128 mg/dL — ABNORMAL HIGH (ref 70–99)
Glucose-Capillary: 161 mg/dL — ABNORMAL HIGH (ref 70–99)

## 2020-09-27 MED ORDER — LORAZEPAM 0.5 MG PO TABS
0.2500 mg | ORAL_TABLET | Freq: Every morning | ORAL | Status: DC
  Administered 2020-09-28 – 2020-10-01 (×4): 0.25 mg via ORAL
  Filled 2020-09-27 (×4): qty 1

## 2020-09-27 MED ORDER — POLYVINYL ALCOHOL 1.4 % OP SOLN
1.0000 [drp] | OPHTHALMIC | Status: DC | PRN
  Administered 2020-09-27 – 2020-09-28 (×2): 1 [drp] via OPHTHALMIC
  Filled 2020-09-27: qty 15

## 2020-09-27 MED ORDER — LORAZEPAM 0.5 MG PO TABS
0.2500 mg | ORAL_TABLET | Freq: Every day | ORAL | Status: DC
  Administered 2020-09-27 – 2020-09-30 (×4): 0.25 mg via ORAL
  Filled 2020-09-27 (×4): qty 1

## 2020-09-27 MED ORDER — GERHARDT'S BUTT CREAM
TOPICAL_CREAM | Freq: Four times a day (QID) | CUTANEOUS | Status: DC
  Administered 2020-09-29: 1 via TOPICAL
  Filled 2020-09-27: qty 1

## 2020-09-27 MED ORDER — SALINE SPRAY 0.65 % NA SOLN
1.0000 | NASAL | Status: DC | PRN
  Administered 2020-09-27 – 2020-09-28 (×2): 1 via NASAL
  Filled 2020-09-27: qty 44

## 2020-09-27 NOTE — Progress Notes (Signed)
PROGRESS NOTE   Amanda Cain  IOX:[id number] DOB: [birth year] DOA: 09/24/2020 PCP: Amanda Neer, MD  Brief Narrative:  75 year old community dwelling black female HFpEF complicated by pulmonary HTN [WHO ii/iii?] -follows with Amanda Cain R/L cath 2019 = elevated L/R filling pressures-known dry weight 221 pounds CKD 4+ anemia renal disease DM TY 2 nephropathy neuropathy HLD HTN Hypothyroid BMI 42 Depression  3-4 d LLE, DOE , 10 lb wght gain--in Ed Rx Lasix 80 once--Hydralazine resumed   Admits to fluid indiscretion and taking her diet-has been told different things by different physicians in terms of how much fluid to drink  Hospital-Problem based course  Acute decompensated HFpEF/who grade 2 pulmonary hypertension Cath 2019 elevated pressures dry weight 221 Weight currently 214 Creatinine has bumped precluding further aggressive diuresis  Hold off all diuretics-liberalize fluid intake to about 2 to 2-1/2 L p.o. today Cut back dose Toprol-XL-continue Cardizem 360 continue hydralazine 25 3 times daily CKD 4 with anemia of renal disease Has outpatient appointment with Amanda Cain of nephrology on 09/30/2020-I will CC Amanda Cain so that outpatient follow-up can be rescheduled Outpatient iron studies Inflamed sebaceous cyst status post treatment in outpatient setting Wound does have some purulence Will probably need general surgery input in the outpatient setting-patient did not pick up the repeat antibiotics that were prescribed DM TY 2 nephropathy neuropathy CBG 1 19-1 60 eating 90% of meals Continue Lantus 10 and sliding scale Continue gabapentin 100 twice daily Severe reflux Continue Carafate 1 g daily, Protonix 40 daily Trichotillomania/depression Continue Invega 3 at bedtime, lorazepam 0.5 at bedtime, Lexapro 20 daily Outpatient  DVT prophylaxis: Lovenox Code Status: Full Family Communication: Discussed with the patient's Sister Amanda Cain on telephone today and  updated her She informed me of upcoming nephrology appointment which I will ensure is communicated to her nephrologist Disposition:  Status is: Inpatient  Remains inpatient appropriate because:Persistent severe electrolyte disturbances, Ongoing diagnostic testing needed not appropriate for outpatient work up and Unsafe d/c plan   Dispo: The patient is from: Home              Anticipated d/c is to: Home probably in 24 to 48 hours              Patient currently is not medically stable to d/c.   Difficult to place patient No  Consultants:   None yet  Procedures:   Antimicrobials:  Doxycycline   Subjective: Doing fair no distress Sitting out of bed No chest pain Eating drinking No nausea no vomiting  Objective: Vitals:   09/27/20 0300 09/27/20 0529 09/27/20 0740 09/27/20 1240  BP: (!) 143/59 (!) 147/44 (!) 149/72 (!) 160/84  Pulse: 64  62 62  Resp: 16  19 (!) 22  Temp: 98 F (36.7 C)  98.1 F (36.7 C) 97.9 F (36.6 C)  TempSrc: Oral  Oral Oral  SpO2: 90%  94% 95%  Weight: 97.6 kg     Height:        Intake/Output Summary (Last 24 hours) at 09/27/2020 1513 Last data filed at 09/27/2020 1300 Gross per 24 hour  Intake 1240 ml  Output 760 ml  Net 480 ml   Filed Weights   09/25/20 0511 09/26/20 0620 09/27/20 0300  Weight: 96.3 kg 94.9 kg 97.6 kg    Examination:  Coherent no distress EOMI NCAT CTA B no added sound no rales no rhonchi Abdomen soft nontender nondistended Wound examined on upper back again today-shows small 0.5 cm punctum expressing cream-colored exudate Expressed  as much as possible and covered with clean 4 x 4   Data Reviewed: personally reviewed   CBC    Component Value Date/Time   WBC 8.1 09/27/2020 0020   RBC 3.46 (L) 09/27/2020 0020   HGB 9.7 (L) 09/27/2020 0020   HCT 30.9 (L) 09/27/2020 0020   PLT 342 09/27/2020 0020   MCV 89.3 09/27/2020 0020   MCH 28.0 09/27/2020 0020   MCHC 31.4 09/27/2020 0020   RDW 16.0 (H) 09/27/2020 0020    LYMPHSABS 1.1 09/27/2020 0020   MONOABS 0.8 09/27/2020 0020   EOSABS 0.3 09/27/2020 0020   BASOSABS 0.0 09/27/2020 0020   CMP Latest Ref Rng & Units 09/27/2020 09/26/2020 09/25/2020  Glucose 70 - 99 mg/dL 113(H) 118(H) 72  BUN 8 - 23 mg/dL 64(H) 59(H) 54(H)  Creatinine 0.44 - 1.00 mg/dL 3.36(H) 3.12(H) 2.45(H)  Sodium 135 - 145 mmol/L 130(L) 136 136  Potassium 3.5 - 5.1 mmol/L 4.3 3.7 3.7  Chloride 98 - 111 mmol/L 95(L) 99 99  CO2 22 - 32 mmol/L 26 28 26   Calcium 8.9 - 10.3 mg/dL 8.5(L) 8.9 9.6  Total Protein 6.5 - 8.1 g/dL 5.8(L) 6.3(L) 7.1  Total Bilirubin 0.3 - 1.2 mg/dL 0.8 0.4 0.4  Alkaline Phos 38 - 126 U/L 54 54 63  AST 15 - 41 U/L 15 15 17   ALT 0 - 44 U/L 18 17 19      Radiology Studies: No results found.   Scheduled Meds: . atorvastatin  80 mg Oral Daily  . cholecalciferol  1,000 Units Oral Daily  . diltiazem  360 mg Oral Daily  . doxycycline  100 mg Oral Q12H  . enoxaparin (LOVENOX) injection  30 mg Subcutaneous Q24H  . escitalopram  20 mg Oral QHS  . ezetimibe  10 mg Oral Daily  . fluticasone furoate-vilanterol  1 puff Inhalation Daily  . gabapentin  100 mg Oral BID  . Gerhardt's butt cream   Topical QID  . guaiFENesin  600 mg Oral Daily  . hydrALAZINE  25 mg Oral Q8H  . insulin aspart  0-9 Units Subcutaneous TID WC  . insulin glargine  10 Units Subcutaneous Daily  . levothyroxine  200 mcg Oral Q0600  . [START ON 09/28/2020] LORazepam  0.25 mg Oral q AM  . [START ON 09/28/2020] LORazepam  0.25 mg Oral Q lunch  . LORazepam  0.5 mg Oral QHS  . metoprolol succinate  50 mg Oral q morning  . montelukast  10 mg Oral QHS  . paliperidone  3 mg Oral QHS  . pantoprazole  40 mg Oral Daily  . potassium chloride SA  20 mEq Oral BID  . saccharomyces boulardii  250 mg Oral BID  . sucralfate  1 g Oral QPC breakfast  . traMADol  50 mg Oral Q12H  . vitamin B-12  1,000 mcg Oral Daily   Continuous Infusions:   LOS: 3 days   Time spent: 37 minutes  Amanda Sells,  MD [facility] To contact the attending provider between 7A-7P or the covering provider during after hours 7P-7A, please log into the web site [email or web address] and access using universal [place] password for that web site. If you do not have the password, please call the hospital operator.  09/27/2020, 3:13 PM

## 2020-09-27 NOTE — Plan of Care (Signed)

## 2020-09-28 ENCOUNTER — Other Ambulatory Visit: Payer: Self-pay | Admitting: Family Medicine

## 2020-09-28 LAB — CBC WITH DIFFERENTIAL/PLATELET
Abs Immature Granulocytes: 0.04 10*3/uL (ref 0.00–0.07)
Basophils Absolute: 0 10*3/uL (ref 0.0–0.1)
Basophils Relative: 1 %
Eosinophils Absolute: 0.3 10*3/uL (ref 0.0–0.5)
Eosinophils Relative: 4 %
HCT: 31.1 % — ABNORMAL LOW (ref 36.0–46.0)
Hemoglobin: 10 g/dL — ABNORMAL LOW (ref 12.0–15.0)
Immature Granulocytes: 1 %
Lymphocytes Relative: 17 %
Lymphs Abs: 1.2 10*3/uL (ref 0.7–4.0)
MCH: 28.2 pg (ref 26.0–34.0)
MCHC: 32.2 g/dL (ref 30.0–36.0)
MCV: 87.6 fL (ref 80.0–100.0)
Monocytes Absolute: 0.6 10*3/uL (ref 0.1–1.0)
Monocytes Relative: 9 %
Neutro Abs: 5.1 10*3/uL (ref 1.7–7.7)
Neutrophils Relative %: 68 %
Platelets: 319 10*3/uL (ref 150–400)
RBC: 3.55 MIL/uL — ABNORMAL LOW (ref 3.87–5.11)
RDW: 15.6 % — ABNORMAL HIGH (ref 11.5–15.5)
WBC: 7.2 10*3/uL (ref 4.0–10.5)
nRBC: 0 % (ref 0.0–0.2)

## 2020-09-28 LAB — COMPREHENSIVE METABOLIC PANEL
ALT: 18 U/L (ref 0–44)
AST: 18 U/L (ref 15–41)
Albumin: 3.1 g/dL — ABNORMAL LOW (ref 3.5–5.0)
Alkaline Phosphatase: 61 U/L (ref 38–126)
Anion gap: 9 (ref 5–15)
BUN: 70 mg/dL — ABNORMAL HIGH (ref 8–23)
CO2: 26 mmol/L (ref 22–32)
Calcium: 8.3 mg/dL — ABNORMAL LOW (ref 8.9–10.3)
Chloride: 92 mmol/L — ABNORMAL LOW (ref 98–111)
Creatinine, Ser: 3.9 mg/dL — ABNORMAL HIGH (ref 0.44–1.00)
GFR, Estimated: 11 mL/min — ABNORMAL LOW (ref >60)
Glucose, Bld: 120 mg/dL — ABNORMAL HIGH (ref 70–99)
Potassium: 4.4 mmol/L (ref 3.5–5.1)
Sodium: 127 mmol/L — ABNORMAL LOW (ref 135–145)
Total Bilirubin: 0.5 mg/dL (ref 0.3–1.2)
Total Protein: 6.1 g/dL — ABNORMAL LOW (ref 6.5–8.1)

## 2020-09-28 LAB — GLUCOSE, CAPILLARY
Glucose-Capillary: 110 mg/dL — ABNORMAL HIGH (ref 70–99)
Glucose-Capillary: 121 mg/dL — ABNORMAL HIGH (ref 70–99)
Glucose-Capillary: 108 mg/dL — ABNORMAL HIGH (ref 70–99)
Glucose-Capillary: 168 mg/dL — ABNORMAL HIGH (ref 70–99)
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
## 2020-09-28 NOTE — Progress Notes (Signed)
Heart Failure Navigator Progress Note  Assessed for Heart & Vascular TOC clinic readiness.  Unfortunately at this time the patient does not meet criteria due to up trend of SCr, today 3.90.   Navigator available for reassessment of patient.   Pricilla Holm, RN, BSN Heart Failure Nurse Navigator [phone]

## 2020-09-28 NOTE — Care Management Important Message (Signed)
Important Message  Patient Details  Name: Amanda Cain MRN: [id number] Date of Birth: 12/08/1945   Medicare Important Message Given:  Yes     Orbie Pyo 09/28/2020, 3:32 PM

## 2020-09-28 NOTE — Plan of Care (Signed)
  Problem: Education: Goal: Knowledge of General Education information will improve Description: Including pain rating scale, medication(s)/side effects and non-pharmacologic comfort measures Outcome: Progressing   Problem: Health Behavior/Discharge Planning: Goal: Ability to manage health-related needs will improve Outcome: Progressing   Problem: Clinical Measurements: Goal: Ability to maintain clinical measurements within normal limits will improve Outcome: Progressing Goal: Will remain free from infection Outcome: Progressing Goal: Diagnostic test results will improve Outcome: Progressing Goal: Respiratory complications will improve Outcome: Progressing Goal: Cardiovascular complication will be avoided Outcome: Progressing   Problem: Activity: Goal: Risk for activity intolerance will decrease Outcome: Progressing   Problem: Nutrition: Goal: Adequate nutrition will be maintained Outcome: Progressing   Problem: Coping: Goal: Level of anxiety will decrease Outcome: Progressing   Problem: Elimination: Goal: Will not experience complications related to bowel motility Outcome: Progressing Goal: Will not experience complications related to urinary retention Outcome: Progressing   Problem: Pain Managment: Goal: General experience of comfort will improve Outcome: Progressing   Problem: Activity: Goal: Capacity to carry out activities will improve Outcome: Progressing   Problem: Cardiac: Goal: Ability to achieve and maintain adequate cardiopulmonary perfusion will improve Outcome: Progressing

## 2020-09-28 NOTE — Progress Notes (Signed)
PROGRESS NOTE   Amanda Cain  ZOX:[id number] DOB: Jul 25, 1945 DOA: 09/24/2020 PCP: Mayra Neer, MD  Brief Narrative:  75 year old community dwelling black female HFpEF complicated by pulmonary HTN [WHO ii/iii?] -follows with Dr. Irish Lack R/L cath 2019 = elevated L/R filling pressures-known dry weight 221 pounds CKD 4+ anemia renal disease DM TY 2 nephropathy neuropathy HLD HTN Hypothyroid BMI 42 Depression  3-4 d LLE, DOE , 10 lb wght gain--in Ed Rx Lasix 80 once--Hydralazine resumed   Admits to fluid indiscretion and taking her diet-has been told different things by different physicians in terms of how much fluid to drink  Hospital-Problem based course  Acute decompensated HFpEF/who grade 2 pulmonary hypertension Cath 2019 elevated pressures dry weight 221 Weight currently 214 Hold off all diuretics-liberalize fluid intake to about 2 to 2-1/2 L p.o. today Cut back dose Toprol-XL-continue Cardizem 360 continue hydralazine 25 3 times daily CKD 4 with anemia of renal disease Has outpatient appointment with Dr. Carolin Sicks of nephrology on 09/30/2020- Formally consulted nephrology 5/2 to determine if needs access placement mapping etc. Inflamed sebaceous cyst status post treatment in outpatient setting Wound continues to be purulent with drainage Doxycycline started back General surgery will see on 5/3 for consideration of surgery DM TY 2 nephropathy neuropathy CBG 1 10-1 21 eating 90% of meals Continue Lantus 10 and sliding scale Continue gabapentin 100 twice daily Severe reflux Continue Carafate 1 g daily, Protonix 40 daily Trichotillomania/depression Continue Invega 3 at bedtime, lorazepam 0.5 at bedtime, Lexapro 20 daily Outpatient  DVT prophylaxis: Lovenox Code Status: Full Family Communication: No family present today Disposition:  Status is: Inpatient  Remains inpatient appropriate because:Persistent severe electrolyte disturbances, Ongoing diagnostic  testing needed not appropriate for outpatient work up and Unsafe d/c plan   Dispo: The patient is from: Home              Anticipated d/c is to: Home probably in 24 to 48 hours              Patient currently is not medically stable to d/c.   Difficult to place patient No  Consultants:   None yet  Procedures:   Antimicrobials:  Doxycycline   Subjective:  Coherent no distress EOMI NCAT Eating drinking Passing urine no fever no chills   Objective: Vitals:   09/28/20 0909 09/28/20 0925 09/28/20 1040 09/28/20 1215  BP: 125/69  (!) 154/74   Pulse:  61 65   Resp:  18    Temp:    98.3 F (36.8 C)  TempSrc:    Oral  SpO2:  97%    Weight:      Height:        Intake/Output Summary (Last 24 hours) at 09/28/2020 1609 Last data filed at 09/28/2020 1300 Gross per 24 hour  Intake 660 ml  Output 600 ml  Net 60 ml   Filed Weights   09/25/20 0511 09/26/20 0620 09/27/20 0300  Weight: 96.3 kg 94.9 kg 97.6 kg    Examination:  Awake coherent no distress Area in the back shows some drainage from under the bandage and still seems slightly erythematous started doxycycline Abdomen soft nontender no rebound no guarding No lower extremity edema neurologically intact        Data Reviewed: personally reviewed   Sodium 127, potassium 4.4 CO2 26 BUNs/creatinine-->57/2.4-->70/3.9 Hemoglobin 10.0  CBG 110-161 Net volumes +60 cc since admission   Radiology Studies: No results found.   Scheduled Meds: . atorvastatin  80 mg Oral Daily  .  cholecalciferol  1,000 Units Oral Daily  . diltiazem  360 mg Oral Daily  . doxycycline  100 mg Oral Q12H  . enoxaparin (LOVENOX) injection  30 mg Subcutaneous Q24H  . escitalopram  20 mg Oral QHS  . ezetimibe  10 mg Oral Daily  . fluticasone furoate-vilanterol  1 puff Inhalation Daily  . gabapentin  100 mg Oral BID  . Gerhardt's butt cream   Topical QID  . guaiFENesin  600 mg Oral Daily  . hydrALAZINE  25 mg Oral Q8H  . insulin aspart  0-9  Units Subcutaneous TID WC  . insulin glargine  10 Units Subcutaneous Daily  . levothyroxine  200 mcg Oral Q0600  . LORazepam  0.25 mg Oral q AM  . LORazepam  0.25 mg Oral Q lunch  . LORazepam  0.5 mg Oral QHS  . metoprolol succinate  50 mg Oral q morning  . montelukast  10 mg Oral QHS  . paliperidone  3 mg Oral QHS  . pantoprazole  40 mg Oral Daily  . potassium chloride SA  20 mEq Oral BID  . saccharomyces boulardii  250 mg Oral BID  . sucralfate  1 g Oral QPC breakfast  . traMADol  50 mg Oral Q12H  . vitamin B-12  1,000 mcg Oral Daily   Continuous Infusions:   LOS: 4 days   Time spent: 37 minutes  Nita Sells, MD [facility] To contact the attending provider between 7A-7P or the covering provider during after hours 7P-7A, please log into the web site [email or web address] and access using universal [place] password for that web site. If you do not have the password, please call the hospital operator.  09/28/2020, 4:09 PM

## 2020-09-28 NOTE — Consult Note (Signed)
   [facility] Inpatient Consult   09/28/2020  Amanda Cain 10-11-1945 [id number]   [facility] [ACO] Patient: [employer]   Patient screened for hospitalization with noted extreme high risk score for unplanned readmission risk and to assess for potential [place] Management service needs for post hospital transition.  Review of patient's medical record reveals patient is from home with some personal care services as reviewed from [facility] notes.  Plan:  Continue to follow progress and disposition needs with inpatient [facility] team and to assess for post hospital care management needs.    For questions contact:   Natividad Brood, RN BSN [place] [facility] Liaison  [phone] business mobile phone Toll free office [phone]  Fax number: [phone] [email or web address]

## 2020-09-29 LAB — URINALYSIS, ROUTINE W REFLEX MICROSCOPIC
Bilirubin Urine: NEGATIVE
Glucose, UA: NEGATIVE mg/dL
Hgb urine dipstick: NEGATIVE
Ketones, ur: NEGATIVE mg/dL
Leukocytes,Ua: NEGATIVE
Nitrite: NEGATIVE
Protein, ur: 100 mg/dL — AB
Specific Gravity, Urine: 1.01 (ref 1.005–1.030)
pH: 5 (ref 5.0–8.0)

## 2020-09-29 LAB — CBC WITH DIFFERENTIAL/PLATELET
Abs Immature Granulocytes: 0.06 10*3/uL (ref 0.00–0.07)
Basophils Absolute: 0 10*3/uL (ref 0.0–0.1)
Basophils Relative: 1 %
Eosinophils Absolute: 0.2 10*3/uL (ref 0.0–0.5)
Eosinophils Relative: 3 %
HCT: 30 % — ABNORMAL LOW (ref 36.0–46.0)
Hemoglobin: 9.4 g/dL — ABNORMAL LOW (ref 12.0–15.0)
Immature Granulocytes: 1 %
Lymphocytes Relative: 14 %
Lymphs Abs: 1.1 10*3/uL (ref 0.7–4.0)
MCH: 27.7 pg (ref 26.0–34.0)
MCHC: 31.3 g/dL (ref 30.0–36.0)
MCV: 88.5 fL (ref 80.0–100.0)
Monocytes Absolute: 0.8 10*3/uL (ref 0.1–1.0)
Monocytes Relative: 10 %
Neutro Abs: 5.6 10*3/uL (ref 1.7–7.7)
Neutrophils Relative %: 71 %
Platelets: 319 10*3/uL (ref 150–400)
RBC: 3.39 MIL/uL — ABNORMAL LOW (ref 3.87–5.11)
RDW: 15.8 % — ABNORMAL HIGH (ref 11.5–15.5)
WBC: 7.7 10*3/uL (ref 4.0–10.5)
nRBC: 0 % (ref 0.0–0.2)

## 2020-09-29 LAB — COMPREHENSIVE METABOLIC PANEL
ALT: 17 U/L (ref 0–44)
AST: 16 U/L (ref 15–41)
Albumin: 3.1 g/dL — ABNORMAL LOW (ref 3.5–5.0)
Alkaline Phosphatase: 58 U/L (ref 38–126)
Anion gap: 10 (ref 5–15)
BUN: 71 mg/dL — ABNORMAL HIGH (ref 8–23)
CO2: 23 mmol/L (ref 22–32)
Calcium: 8.4 mg/dL — ABNORMAL LOW (ref 8.9–10.3)
Chloride: 96 mmol/L — ABNORMAL LOW (ref 98–111)
Creatinine, Ser: 3.44 mg/dL — ABNORMAL HIGH (ref 0.44–1.00)
GFR, Estimated: 13 mL/min — ABNORMAL LOW (ref >60)
Glucose, Bld: 129 mg/dL — ABNORMAL HIGH (ref 70–99)
Potassium: 5 mmol/L (ref 3.5–5.1)
Sodium: 129 mmol/L — ABNORMAL LOW (ref 135–145)
Total Bilirubin: 0.1 mg/dL — ABNORMAL LOW (ref 0.3–1.2)
Total Protein: 6 g/dL — ABNORMAL LOW (ref 6.5–8.1)

## 2020-09-29 LAB — GLUCOSE, CAPILLARY
Glucose-Capillary: 105 mg/dL — ABNORMAL HIGH (ref 70–99)
Glucose-Capillary: 118 mg/dL — ABNORMAL HIGH (ref 70–99)
Glucose-Capillary: 134 mg/dL — ABNORMAL HIGH (ref 70–99)
Glucose-Capillary: 120 mg/dL — ABNORMAL HIGH (ref 70–99)

## 2020-09-29 NOTE — Progress Notes (Signed)
PROGRESS NOTE   Amanda Cain  ZTI:[id number] DOB: 02/11/1946 DOA: 09/24/2020 PCP: Mayra Neer, MD  Brief Narrative:  75 year old community dwelling black female HFpEF complicated by pulmonary HTN [WHO ii/iii?] -follows with Dr. Irish Lack R/L cath 2019 = elevated L/R filling pressures-known dry weight 221 pounds CKD 4+ anemia renal disease--- first follow-up with was with Dr. Carolin Sicks scheduled as an outpatient 09/30/2020 DM TY 2 nephropathy neuropathy HLD HTN Hypothyroid BMI 42 Depression  3-4 d LLE, DOE , 10 lb wght gain--in Ed Rx Lasix 80 once--Hydralazine resumed   Admits to fluid indiscretion and taking her diet-has been told different things by different physicians in terms of how much fluid to drink  She was initially diuresed however her creatinine started to climb  Hospital-Problem based course  Acute decompensated HFpEF/who grade 2 pulmonary hypertension Cath 2019 elevated pressures dry weight 221 Weight currently 214 Hold off all diuretics-liberalize fluid intake to about 2 to 2-1/2 L p.o. today Cut back dose Toprol-XL-continue Cardizem 360 continue hydralazine 25 3 times daily CKD 4 with anemia of renal disease Has outpatient appointment with Dr. Carolin Sicks of nephrology on 09/30/2020- consulted nephrology 5/2 to determine next steps No current acute indication for HD Inflamed sebaceous cyst status post treatment in outpatient setting Wound continues to be purulent with drainage Doxycycline started back General surgery saw patient 5/3 and do not think any procedures are recommended for this at this time DM TY 2 nephropathy neuropathy CBG 10 5-1 29 eating 90% of meals Continue Lantus 10 and sliding scale Continue gabapentin 100 twice daily Severe reflux Continue Carafate 1 g daily, Protonix 40 daily Trichotillomania/depression Continue Invega 3 at bedtime, lorazepam 0.5 as per orders, Lexapro 20 daily Outpatient  DVT prophylaxis: Lovenox Code Status:  Full Family Communication:  Malachy Moan   [phone]   Disposition:  Status is: Inpatient  Remains inpatient appropriate because:Persistent severe electrolyte disturbances, Ongoing diagnostic testing needed not appropriate for outpatient work up and Unsafe d/c plan   Dispo: The patient is from: Home              Anticipated d/c is to: Home probably in 24 to 48 hours              Patient currently is not medically stable to d/c.   Difficult to place patient No  Consultants:   None yet  Procedures:   Antimicrobials:  Doxycycline   Subjective:  Pleasant coherent no distress EOMI NCAT Eating drinking without any issue tolerating well Has been up and about without any other concerns   Objective: Vitals:   09/28/20 2244 09/29/20 0300 09/29/20 0757 09/29/20 0910  BP: (!) 155/63 (!) 150/67 (!) 150/47   Pulse: 73 68    Resp: 18 15 20    Temp: 97.8 F (36.6 C) 97.8 F (36.6 C) (!) 97.4 F (36.3 C)   TempSrc: Oral Oral Oral   SpO2: 92% 90% 95% 98%  Weight:  103.1 kg    Height:        Intake/Output Summary (Last 24 hours) at 09/29/2020 1119 Last data filed at 09/29/2020 1015 Gross per 24 hour  Intake 360 ml  Output 650 ml  Net -290 ml   Filed Weights   09/26/20 0620 09/27/20 0300 09/29/20 0300  Weight: 94.9 kg 97.6 kg 103.1 kg    Examination:  Coherent no distress Chest clear no added sound no rales no rhonchi Wound not examined today Abdomen soft nontender no rebound no guarding No lower extremity edema neurologically intact  Data Reviewed: personally reviewed   Sodium 129 potassium 5.0  CO2 23 BUNs/creatinine-->57/2.4-->70/3.9---> 71/3.4 Hemoglobin 10.0-->9.4 White count 7.7   Radiology Studies: No results found.   Scheduled Meds: . atorvastatin  80 mg Oral Daily  . cholecalciferol  1,000 Units Oral Daily  . diltiazem  360 mg Oral Daily  . doxycycline  100 mg Oral Q12H  . enoxaparin (LOVENOX) injection  30 mg Subcutaneous Q24H   . escitalopram  20 mg Oral QHS  . ezetimibe  10 mg Oral Daily  . fluticasone furoate-vilanterol  1 puff Inhalation Daily  . gabapentin  100 mg Oral BID  . Gerhardt's butt cream   Topical QID  . guaiFENesin  600 mg Oral Daily  . hydrALAZINE  25 mg Oral Q8H  . insulin aspart  0-9 Units Subcutaneous TID WC  . insulin glargine  10 Units Subcutaneous Daily  . levothyroxine  200 mcg Oral Q0600  . LORazepam  0.25 mg Oral q AM  . LORazepam  0.25 mg Oral Q lunch  . LORazepam  0.5 mg Oral QHS  . metoprolol succinate  50 mg Oral q morning  . montelukast  10 mg Oral QHS  . paliperidone  3 mg Oral QHS  . pantoprazole  40 mg Oral Daily  . potassium chloride SA  20 mEq Oral BID  . saccharomyces boulardii  250 mg Oral BID  . sucralfate  1 g Oral QPC breakfast  . traMADol  50 mg Oral Q12H  . vitamin B-12  1,000 mcg Oral Daily   Continuous Infusions:   LOS: 5 days   Time spent: 27 minutes  Nita Sells, MD [facility] To contact the attending provider between 7A-7P or the covering provider during after hours 7P-7A, please log into the web site [email or web address] and access using universal [place] password for that web site. If you do not have the password, please call the hospital operator.  09/29/2020, 11:19 AM

## 2020-09-29 NOTE — Progress Notes (Signed)
Admit: 09/24/2020 LOS: 5  38F CKD5 likely 2/2 DKD with AoC dCHF exacerbation, AoCKD5  Subjective:  Marland Kitchen SCr improved to 3.44  . UOP not fully quantified at least 0.6L + 4 voids . On RA, BPs stable . No SOB, feels well, working with PT  05/02 0701 - 05/03 0700 In: 120 [P.O.:120] Out: 550 [Urine:550]  Filed Weights   09/26/20 0620 09/27/20 0300 09/29/20 0300  Weight: 94.9 kg 97.6 kg 103.1 kg    Scheduled Meds: . atorvastatin  80 mg Oral Daily  . cholecalciferol  1,000 Units Oral Daily  . diltiazem  360 mg Oral Daily  . doxycycline  100 mg Oral Q12H  . enoxaparin (LOVENOX) injection  30 mg Subcutaneous Q24H  . escitalopram  20 mg Oral QHS  . ezetimibe  10 mg Oral Daily  . fluticasone furoate-vilanterol  1 puff Inhalation Daily  . gabapentin  100 mg Oral BID  . Gerhardt's butt cream   Topical QID  . guaiFENesin  600 mg Oral Daily  . hydrALAZINE  25 mg Oral Q8H  . insulin aspart  0-9 Units Subcutaneous TID WC  . insulin glargine  10 Units Subcutaneous Daily  . levothyroxine  200 mcg Oral Q0600  . LORazepam  0.25 mg Oral q AM  . LORazepam  0.25 mg Oral Q lunch  . metoprolol succinate  50 mg Oral q morning  . montelukast  10 mg Oral QHS  . paliperidone  3 mg Oral QHS  . pantoprazole  40 mg Oral Daily  . potassium chloride SA  20 mEq Oral BID  . saccharomyces boulardii  250 mg Oral BID  . sucralfate  1 g Oral QPC breakfast  . traMADol  50 mg Oral Q12H  . vitamin B-12  1,000 mcg Oral Daily   Continuous Infusions: PRN Meds:.acetaminophen **OR** acetaminophen, albuterol, hydrALAZINE, polyvinyl alcohol, sodium chloride  Current Labs: reviewed   Physical Exam:  Blood pressure (!) 149/62, pulse (!) 59, temperature 97.7 F (36.5 C), temperature source Oral, resp. rate 19, height 4\' 11"  (1.499 m), weight 103.1 kg, SpO2 99 %. NAD RRR nl s1s2 CTAB but diminished in bases No LEE NCAT EOMI AAO x3 S/nt/nd  A 1. AoCKD5, Bhandari follows at [facility]; likely related to #2 2. AoC  HFpEF exacerbation, vol status imprved after parenteral diuretics, now on hold 3. Anemia 4. DM2  5. HTN 6. HLD 7. Obesity  P . Hold diuretics for anotehr 24h, then would resume orally . Daily weights, Daily Renal Panel, Strict I/Os, Avoid nephrotoxins (NSAIDs, judicious IV Contrast) . Will cont to closely follow . Medication Issues; o Preferred narcotic agents for pain control are hydromorphone, fentanyl, and methadone. Morphine should not be used.  o Baclofen should be avoided o Avoid oral sodium phosphate and magnesium citrate based laxatives / bowel preps    Pearson Grippe MD 09/29/2020, 1:36 PM  Recent Labs  Lab 09/27/20 0020 09/28/20 0041 09/29/20 0135  NA 130* 127* 129*  K 4.3 4.4 5.0  CL 95* 92* 96*  CO2 26 26 23   GLUCOSE 113* 120* 129*  BUN 64* 70* 71*  CREATININE 3.36* 3.90* 3.44*  CALCIUM 8.5* 8.3* 8.4*   Recent Labs  Lab 09/27/20 0020 09/28/20 0041 09/29/20 0135  WBC 8.1 7.2 7.7  NEUTROABS 5.9 5.1 5.6  HGB 9.7* 10.0* 9.4*  HCT 30.9* 31.1* 30.0*  MCV 89.3 87.6 88.5  PLT 342 319 319

## 2020-09-29 NOTE — Discharge Instructions (Signed)
Epidermoid Cyst  An epidermoid cyst, also called an epidermal cyst, is a small lump under your skin. The cyst contains a substance called keratin. Do not try to pop or open the cyst yourself. What are the causes?  A blocked hair follicle.  A hair that curls and re-enters the skin instead of growing straight out of the skin.  A blocked pore.  Irritated skin.  An injury to the skin.  Certain conditions that are passed along from parent to child.  Human papillomavirus (HPV). This happens rarely when cysts occur on the bottom of the feet.  Long-term sun damage to the skin. What increases the risk?  Having acne.  Being female.  Having an injury to the skin.  Being past puberty.  Having certain conditions caused by genes (genetic disorder) What are the signs or symptoms? These cysts are usually harmless, but they can get infected. Symptoms of infection may include:  Redness.  Inflammation.  Tenderness.  Warmth.  Fever.  A bad-smelling substance that drains from the cyst.  Pus that drains from the cyst. How is this treated? In many cases, epidermoid cysts go away on their own without treatment. If a cyst becomes infected, treatment may include:  Opening and draining the cyst, done by a doctor. After draining, you may need minor surgery to remove the rest of the cyst.  Antibiotic medicine.  Shots of medicines (steroids) that help to reduce inflammation.  Surgery to remove the cyst. Surgery may be done if the cyst: ? Becomes large. ? Bothers you. ? Has a chance of turning into cancer.  Do not try to open a cyst yourself. Follow these instructions at home: Medicines  Take over-the-counter and prescription medicines as told by your doctor.  If you were prescribed an antibiotic medicine, take it as told by your doctor. Do not stop taking it even if you start to feel better. General instructions  Keep the area around your cyst clean and dry.  Wear loose, dry  clothing.  Avoid touching your cyst.  Check your cyst every day for signs of infection. Check for: ? Redness, swelling, or pain. ? Fluid or blood. ? Warmth. ? Pus or a bad smell.  Keep all follow-up visits. How is this prevented?  Wear clean, dry, clothing.  Avoid wearing tight clothing.  Keep your skin clean and dry. Take showers or baths every day. Contact a doctor if:  Your cyst has symptoms of infection.  Your condition does not improve or gets worse.  You have a cyst that looks different from other cysts you have had.  You have a fever. Get help right away if:  Redness spreads from the cyst into the area close by. Summary  An epidermoid cyst is a small lump under your skin.  If a cyst becomes infected, treatment may include surgery to open and drain the cyst, or to remove it.  Take over-the-counter and prescription medicines only as told by your doctor.  Contact a doctor if your condition is not improving or is getting worse.  Keep all follow-up visits. This information is not intended to replace advice given to you by your health care provider. Make sure you discuss any questions you have with your health care provider. Document Revised: 08/21/2019 Document Reviewed: 08/21/2019 Elsevier Patient Education  2021 [employer] Inc.  

## 2020-09-29 NOTE — Plan of Care (Signed)
  Problem: Education: Goal: Knowledge of General Education information will improve Description: Including pain rating scale, medication(s)/side effects and non-pharmacologic comfort measures Outcome: Progressing   Problem: Activity: Goal: Risk for activity intolerance will decrease Outcome: Progressing   Problem: Coping: Goal: Level of anxiety will decrease Outcome: Progressing   Problem: Elimination: Goal: Will not experience complications related to bowel motility Outcome: Progressing   Problem: Pain Managment: Goal: General experience of comfort will improve Outcome: Progressing   Problem: Activity: Goal: Capacity to carry out activities will improve Outcome: Progressing
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
## 2020-09-29 NOTE — Evaluation (Signed)
Physical Therapy Evaluation Patient Details Name: Amanda Cain MRN: [id number] DOB: May 23, 1946 Today's Date: 09/29/2020   History of Present Illness  Pt is a 75 y.o. F who presents with increased BLE edema, weight gain and exertional SOB. Admitted with acute on chronic diastolic CHF with history of pulmonary hypertension. Also noted to have infected sebaceous cyst.  Significant PMH: CKD stage IV, T2DM, HLD, HTN, morbid obesity, depression.  Clinical Impression  PTA, pt lives alone and has a PCA 5 hours/day, 7 days/wk to assist with ADL's/IADL's and uses a RW for mobility. Pt presents likely fairly close to her functional baseline. Ambulating x 60 feet with a walker at a min guard assist level. SpO2 90-93% on RA, HR stable in 70's. CHF education provided regarding signs/symptoms, daily weights, and exercise recommendations. Recommend continued HHPT to address deficits.     Follow Up Recommendations Home health PT;Supervision for mobility/OOB    Equipment Recommendations  None recommended by PT    Recommendations for Other Services       Precautions / Restrictions Precautions Precautions: Fall Restrictions Weight Bearing Restrictions: No      Mobility  Bed Mobility               General bed mobility comments: OOB in chair    Transfers Overall transfer level: Needs assistance Equipment used: None Transfers: Sit to/from Stand Sit to Stand: Supervision         General transfer comment: Supervision for safety  Ambulation/Gait Ambulation/Gait assistance: Min guard Gait Distance (Feet): 60 Feet Assistive device: Rolling walker (2 wheeled) Gait Pattern/deviations: Step-through pattern;Decreased stride length;Decreased dorsiflexion - right;Decreased dorsiflexion - left Gait velocity: decreased Gait velocity interpretation: <1.8 ft/sec, indicate of risk for recurrent falls General Gait Details: Cues for walker proximity, pt with noted decreased bilateral foot  clearance. Fatigues easily  Financial trader [employer] (Stroke Patients Only)       Balance Overall balance assessment: Needs assistance Sitting-balance support: Feet supported Sitting balance-Leahy Scale: Good     Standing balance support: No upper extremity supported;During functional activity Standing balance-Leahy Scale: Fair                               Pertinent Vitals/Pain Pain Assessment: No/denies pain    Home Living Family/patient expects to be discharged to:: Private residence Living Arrangements: Alone Available Help at Discharge: Personal care attendant;Family;Friend(s);Available PRN/intermittently Type of Home: Apartment Home Access: Level entry     Home Layout: One level Home Equipment: Walker - 2 wheels;Cane - single point;Shower seat;Grab bars - tub/shower;Toilet riser;Bedside commode Additional Comments: aide 5hrs/day 7 days/wk    Prior Function Level of Independence: Needs assistance   Gait / Transfers Assistance Needed: using RW  ADL's / Homemaking Assistance Needed: aide helps with some cooking and bathing of pt  Comments: sleeps in recliner     Hand Dominance   Dominant Hand: Right    Extremity/Trunk Assessment   Upper Extremity Assessment Upper Extremity Assessment: Overall WFL for tasks assessed    Lower Extremity Assessment Lower Extremity Assessment: Generalized weakness    Cervical / Trunk Assessment Cervical / Trunk Assessment: Other exceptions Cervical / Trunk Exceptions: increased body habitus  Communication   Communication: No difficulties  Cognition Arousal/Alertness: Awake/alert Behavior During Therapy: WFL for tasks assessed/performed Overall Cognitive Status: Within Functional Limits for tasks assessed  General Comments      Exercises     Assessment/Plan    PT Assessment Patient needs continued PT  services  PT Problem List Decreased strength;Decreased balance;Decreased activity tolerance;Decreased mobility;Obesity;Cardiopulmonary status limiting activity       PT Treatment Interventions DME instruction;Gait training;Functional mobility training;Therapeutic activities;Therapeutic exercise;Balance training;Patient/family education    PT Goals (Current goals can be found in the Care Plan section)  Acute Rehab PT Goals Patient Stated Goal: improve activity tolerance PT Goal Formulation: With patient Time For Goal Achievement: 10/13/20 Potential to Achieve Goals: Good    Frequency Min 3X/week   Barriers to discharge        Co-evaluation               AM-PAC PT "6 Clicks" Mobility  Outcome Measure Help needed turning from your back to your side while in a flat bed without using bedrails?: None Help needed moving from lying on your back to sitting on the side of a flat bed without using bedrails?: A Little Help needed moving to and from a bed to a chair (including a wheelchair)?: A Little Help needed standing up from a chair using your arms (e.g., wheelchair or bedside chair)?: A Little Help needed to walk in hospital room?: A Little Help needed climbing 3-5 steps with a railing? : A Lot 6 Click Score: 18    End of Session   Activity Tolerance: Patient tolerated treatment well Patient left: with call bell/phone within reach;in chair Nurse Communication: Mobility status PT Visit Diagnosis: Unsteadiness on feet (R26.81);Muscle weakness (generalized) (M62.81);History of falling (Z91.81);Difficulty in walking, not elsewhere classified (R26.2)    Time: 7628-3151 PT Time Calculation (min) (ACUTE ONLY): 16 min   Charges:   PT Evaluation $PT Eval Moderate Complexity: 1 Mod          Wyona Almas, PT, DPT Acute Rehabilitation Services Pager [phone] Office [place] 09/29/2020, 4:19 PM

## 2020-09-30 ENCOUNTER — Other Ambulatory Visit: Payer: Self-pay | Admitting: Family Medicine

## 2020-09-30 DIAGNOSIS — I5033 Acute on chronic diastolic (congestive) heart failure: Secondary | ICD-10-CM

## 2020-09-30 LAB — CBC WITH DIFFERENTIAL/PLATELET
Abs Immature Granulocytes: 0.05 10*3/uL (ref 0.00–0.07)
Basophils Absolute: 0 10*3/uL (ref 0.0–0.1)
Basophils Relative: 0 %
Eosinophils Absolute: 0.2 10*3/uL (ref 0.0–0.5)
Eosinophils Relative: 3 %
HCT: 31.4 % — ABNORMAL LOW (ref 36.0–46.0)
Hemoglobin: 9.9 g/dL — ABNORMAL LOW (ref 12.0–15.0)
Immature Granulocytes: 1 %
Lymphocytes Relative: 12 %
Lymphs Abs: 0.9 10*3/uL (ref 0.7–4.0)
MCH: 27.9 pg (ref 26.0–34.0)
MCHC: 31.5 g/dL (ref 30.0–36.0)
MCV: 88.5 fL (ref 80.0–100.0)
Monocytes Absolute: 0.7 10*3/uL (ref 0.1–1.0)
Monocytes Relative: 10 %
Neutro Abs: 5.5 10*3/uL (ref 1.7–7.7)
Neutrophils Relative %: 74 %
Platelets: 305 10*3/uL (ref 150–400)
RBC: 3.55 MIL/uL — ABNORMAL LOW (ref 3.87–5.11)
RDW: 15.9 % — ABNORMAL HIGH (ref 11.5–15.5)
WBC: 7.4 10*3/uL (ref 4.0–10.5)
nRBC: 0 % (ref 0.0–0.2)

## 2020-09-30 LAB — GLUCOSE, CAPILLARY
Glucose-Capillary: 100 mg/dL — ABNORMAL HIGH (ref 70–99)
Glucose-Capillary: 174 mg/dL — ABNORMAL HIGH (ref 70–99)
Glucose-Capillary: 126 mg/dL — ABNORMAL HIGH (ref 70–99)
Glucose-Capillary: 112 mg/dL — ABNORMAL HIGH (ref 70–99)

## 2020-09-30 LAB — COMPREHENSIVE METABOLIC PANEL
ALT: 18 U/L (ref 0–44)
AST: 15 U/L (ref 15–41)
Albumin: 3.1 g/dL — ABNORMAL LOW (ref 3.5–5.0)
Alkaline Phosphatase: 59 U/L (ref 38–126)
Anion gap: 6 (ref 5–15)
BUN: 66 mg/dL — ABNORMAL HIGH (ref 8–23)
CO2: 23 mmol/L (ref 22–32)
Calcium: 8.7 mg/dL — ABNORMAL LOW (ref 8.9–10.3)
Chloride: 103 mmol/L (ref 98–111)
Creatinine, Ser: 3 mg/dL — ABNORMAL HIGH (ref 0.44–1.00)
GFR, Estimated: 16 mL/min — ABNORMAL LOW (ref >60)
Glucose, Bld: 109 mg/dL — ABNORMAL HIGH (ref 70–99)
Potassium: 5.7 mmol/L — ABNORMAL HIGH (ref 3.5–5.1)
Sodium: 132 mmol/L — ABNORMAL LOW (ref 135–145)
Total Bilirubin: 0.3 mg/dL (ref 0.3–1.2)
Total Protein: 6.2 g/dL — ABNORMAL LOW (ref 6.5–8.1)

## 2020-09-30 MED ORDER — TORSEMIDE 20 MG PO TABS
40.0000 mg | ORAL_TABLET | Freq: Every day | ORAL | Status: DC
  Administered 2020-09-30 – 2020-10-01 (×2): 40 mg via ORAL
  Filled 2020-09-30 (×2): qty 2

## 2020-09-30 NOTE — Plan of Care (Signed)
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
## 2020-09-30 NOTE — NC FL2 (Addendum)
[place] LEVEL OF CARE SCREENING TOOL     IDENTIFICATION  Patient Name: Amanda Cain Birthdate: 1946-04-22 Sex: female Admission Date (Current Location): 09/24/2020  [facility] and [place] Number:  Herbalist and Address:  [facility], [place] [address], [place], [place] [address]      Provider Number: [id number]  Attending Physician Name and Address:  Geradine Girt, DO  Relative Name and Phone Number:       Current Level of Care: Hospital Recommended Level of Care: [place] Prior Approval Number:    Date Approved/Denied:   PASRR Number:   [id number]  Discharge Plan: SNF    Current Diagnoses: Patient Active Problem List   Diagnosis Date Noted  . Acute CHF (congestive heart failure) ([place]) 09/24/2020  . Acute exacerbation of CHF (congestive heart failure) ([place]) 04/16/2020  . Acute on chronic diastolic (congestive) heart failure ([place]) 02/20/2020  . Hypokalemia   . Anxiety   . Physical deconditioning 03/13/2019  . Acute recurrent maxillary sinusitis 03/13/2019  . Pain due to onychomycosis of toenails of both feet 11/21/2018  . Shortness of breath 08/09/2018  . Asthma 06/19/2018  . Chronic respiratory failure with hypoxia ([place]) 05/15/2018  . Pulmonary hypertension ([place]) 05/14/2018  . Atypical chest pain 05/12/2018  . Community acquired pneumonia 12/13/2017  . CAP (community acquired pneumonia) 12/13/2017  . CHF (congestive heart failure) ([place]) 12/12/2017  . Morbid obesity ([place]) 11/21/2017  . Flu 07/07/2017  . Influenza A 07/05/2017  . Acute respiratory failure with hypoxia ([place]) 07/05/2017  . Ascites   . Acute on chronic diastolic heart failure ([place]) 11/17/2016  . CKD (chronic kidney disease), stage III ([place]) 11/17/2016  . S/P laparoscopic cholecystectomy 03/02/2015  . Hyperlipidemia 11/25/2012  . Depression 11/25/2012  . GERD (gastroesophageal reflux disease) 11/25/2012  . Postoperative anemia  due to acute blood loss 11/01/2012  . Osteoarthritis of right knee 10/31/2012  . Hyperglycemia 11/30/2011  . SBP (spontaneous bacterial peritonitis) ([place]) 11/25/2011  . Syncope and collapse 11/20/2011  . Abdominal pain 11/19/2011  . Hyponatremia 11/19/2011  . Anemia 11/19/2011  . Acute kidney injury superimposed on CKD ([place]) 11/19/2011  . Moderate protein-calorie malnutrition ([place]) 11/19/2011  . Essential hypertension 11/19/2011  . Hypothyroidism, acquired 11/19/2011  . Dyslipidemia 11/19/2011  . Diabetes ([place]) 07/15/2011    Orientation RESPIRATION BLADDER Height & Weight     Self,Time,Situation,Place  Normal Continent Weight: 217 lb 6 oz (98.6 kg) Height:  4\' 11"  (149.9 cm)  BEHAVIORAL SYMPTOMS/MOOD NEUROLOGICAL BOWEL NUTRITION STATUS      Continent Diet (See DC Summary)  AMBULATORY STATUS COMMUNICATION OF NEEDS Skin   Limited Assist Verbally Surgical wounds (Back Right; Upper old small cyst with purulent discharge)                       Personal Care Assistance Level of Assistance  Bathing,Feeding,Dressing Bathing Assistance: Limited assistance Feeding assistance: Independent Dressing Assistance: Limited assistance     Functional Limitations Info  Sight,Hearing,Speech Sight Info: Adequate Hearing Info: Adequate Speech Info: Adequate    SPECIAL CARE FACTORS FREQUENCY  PT (By licensed PT),OT (By licensed OT)     PT Frequency: 5x a week OT Frequency: 5x a week            Contractures      Additional Factors Info  Code Status,Allergies Code Status Info: Full Allergies Info: Ace Inhibitors   Amlodipine   Aspirin  Current Medications (09/30/2020):  This is the current hospital active medication list Current Facility-Administered Medications  Medication Dose Route Frequency Provider Last Rate Last Admin  . acetaminophen (TYLENOL) tablet 650 mg  650 mg Oral Q6H PRN Rise Patience, MD   650 mg at 09/27/20 1836   Or  . acetaminophen  (TYLENOL) suppository 650 mg  650 mg Rectal Q6H PRN Rise Patience, MD      . albuterol (VENTOLIN HFA) 108 (90 Base) MCG/ACT inhaler 2 puff  2 puff Inhalation Q4H PRN Rise Patience, MD   2 puff at 09/28/20 2130  . atorvastatin (LIPITOR) tablet 80 mg  80 mg Oral Daily Rise Patience, MD   80 mg at 09/30/20 1057  . cholecalciferol (VITAMIN D3) tablet 1,000 Units  1,000 Units Oral Daily Rise Patience, MD   1,000 Units at 09/30/20 1057  . diltiazem (CARDIZEM CD) 24 hr capsule 360 mg  360 mg Oral Daily Rise Patience, MD   360 mg at 09/30/20 1057  . doxycycline (VIBRA-TABS) tablet 100 mg  100 mg Oral Q12H Nita Sells, MD   100 mg at 09/30/20 1056  . enoxaparin (LOVENOX) injection 30 mg  30 mg Subcutaneous Q24H Rise Patience, MD   30 mg at 09/30/20 1056  . escitalopram (LEXAPRO) tablet 20 mg  20 mg Oral QHS Rise Patience, MD   20 mg at 09/29/20 2219  . ezetimibe (ZETIA) tablet 10 mg  10 mg Oral Daily Rise Patience, MD   10 mg at 09/30/20 1057  . fluticasone furoate-vilanterol (BREO ELLIPTA) 200-25 MCG/INH 1 puff  1 puff Inhalation Daily Rise Patience, MD   1 puff at 09/30/20 0825  . gabapentin (NEURONTIN) capsule 100 mg  100 mg Oral BID Rise Patience, MD   100 mg at 09/30/20 1057  . Gerhardt's butt cream   Topical QID Nita Sells, MD   Given at 09/30/20 1459  . guaiFENesin (MUCINEX) 12 hr tablet 600 mg  600 mg Oral Daily Rise Patience, MD   600 mg at 09/30/20 1057  . hydrALAZINE (APRESOLINE) injection 10 mg  10 mg Intravenous Q4H PRN Rise Patience, MD   10 mg at 09/25/20 0651  . hydrALAZINE (APRESOLINE) tablet 25 mg  25 mg Oral Q8H Rise Patience, MD   25 mg at 09/30/20 1459  . insulin aspart (novoLOG) injection 0-9 Units  0-9 Units Subcutaneous TID WC Rise Patience, MD   2 Units at 09/30/20 1137  . insulin glargine (LANTUS) injection 10 Units  10 Units Subcutaneous Daily Rise Patience, MD    10 Units at 09/30/20 1057  . levothyroxine (SYNTHROID) tablet 200 mcg  200 mcg Oral Q0600 Rise Patience, MD   200 mcg at 09/30/20 6213  . LORazepam (ATIVAN) tablet 0.25 mg  0.25 mg Oral q AM Nita Sells, MD   0.25 mg at 09/30/20 0608  . LORazepam (ATIVAN) tablet 0.25 mg  0.25 mg Oral Q lunch Nita Sells, MD   0.25 mg at 09/30/20 1137  . metoprolol succinate (TOPROL-XL) 24 hr tablet 50 mg  50 mg Oral q morning Nita Sells, MD   50 mg at 09/30/20 1137  . montelukast (SINGULAIR) tablet 10 mg  10 mg Oral QHS Rise Patience, MD   10 mg at 09/29/20 2218  . paliperidone (INVEGA) 24 hr tablet 3 mg  3 mg Oral QHS Rise Patience, MD   3 mg at 09/29/20 2218  .  pantoprazole (PROTONIX) EC tablet 40 mg  40 mg Oral Daily Rise Patience, MD   40 mg at 09/30/20 1057  . polyvinyl alcohol (LIQUIFILM TEARS) 1.4 % ophthalmic solution 1 drop  1 drop Both Eyes PRN Nita Sells, MD   1 drop at 09/28/20 0919  . saccharomyces boulardii (FLORASTOR) capsule 250 mg  250 mg Oral BID Rise Patience, MD   250 mg at 09/30/20 1057  . sodium chloride (OCEAN) 0.65 % nasal spray 1 spray  1 spray Each Nare PRN Nita Sells, MD   1 spray at 09/28/20 0920  . sucralfate (CARAFATE) tablet 1 g  1 g Oral QPC breakfast Rise Patience, MD   1 g at 09/30/20 1057  . torsemide (DEMADEX) tablet 40 mg  40 mg Oral Daily Pearson Grippe B, MD   40 mg at 09/30/20 1103  . traMADol (ULTRAM) tablet 50 mg  50 mg Oral Q12H Nita Sells, MD   50 mg at 09/30/20 1057  . vitamin B-12 (CYANOCOBALAMIN) tablet 1,000 mcg  1,000 mcg Oral Daily Rise Patience, MD   1,000 mcg at 09/30/20 1057     Discharge Medications: Please see discharge summary for a list of discharge medications.  Relevant Imaging Results:  Relevant Lab Results:   Additional Information SSN# [id number]  [place] COVID-19 Vaccine 09/01/2019 , 08/04/2019     Reece Agar, LCSWA

## 2020-09-30 NOTE — TOC Initial Note (Addendum)
Transition of Care [facility]) - Initial/Assessment Note    Patient Details  Name: Amanda Cain MRN: [id number] Date of Birth: February 14, 1946  [facility] CM/SW Contact:    Tresa Endo Phone Number: 09/30/2020, 3:01 PM  Clinical Narrative:                 2:40pm- CSW spoke with pt about SNF at DC at first pt declined but as we spoke more about concerns with her sister and she agreed that she would benefit from a SNF because she has ben having falls latley. Pt stated she has been to [facility] in the past and does not mind going back there, she just wanted to confirm she would not be there long term in which CSW explained that SNF for rehab was short term. Pt sister [name] is agreeable with SNF as well and has been in very active in her sisters care. CSW will fax out pt to [facility] and call for bed availability. CSW will follow up with pt and sister.  3:50pm- CSW spoke completed FL2 and faxed over to [facility] and called for bed availability. Someone will look over pt information and follow up with CSW. Late note: [facility] accepted pt auth is pending and covid has bee put in.  Expected Discharge Plan: [place] Barriers to Discharge: Continued Medical Work up   Patient Goals and CMS Choice        Expected Discharge Plan and Services Expected Discharge Plan: [place]   Discharge Planning Services: CM Consult   Living arrangements for the past 2 months: Apartment                                      Prior Living Arrangements/Services Living arrangements for the past 2 months: Apartment Lives with:: Self Patient language and need for interpreter reviewed:: Yes        Need for Family Participation in Patient Care: Yes (Comment) Care giver support system in place?: Yes (comment)   Criminal Activity/Legal Involvement Pertinent to Current Situation/Hospitalization: No - Comment as needed  Activities of Daily Living Home  Assistive Devices/Equipment: Walker (specify type) ADL Screening (condition at time of admission) Patient's cognitive ability adequate to safely complete daily activities?: Yes Is the patient deaf or have difficulty hearing?: No Does the patient have difficulty seeing, even when wearing glasses/contacts?: No Does the patient have difficulty concentrating, remembering, or making decisions?: No Patient able to express need for assistance with ADLs?: Yes Does the patient have difficulty dressing or bathing?: No Independently performs ADLs?: Yes (appropriate for developmental age) Does the patient have difficulty walking or climbing stairs?: Yes Weakness of Legs: Both Weakness of Arms/Hands: None  Permission Sought/Granted                  Emotional Assessment       Orientation: : Oriented to Place,Oriented to  Time,Oriented to Situation,Oriented to Self Alcohol / Substance Use: Not Applicable Psych Involvement: No (comment)  Admission diagnosis:  CHF (congestive heart failure) ([place]) [I50.9] Peripheral edema [R60.9] Pulmonary hypertension (HCC) [I27.20] Acute CHF (congestive heart failure) ([place]) [X10.6] Acute diastolic congestive heart failure ([place]) [I50.31] Stage 4 chronic kidney disease ([place]) [N18.4] Patient Active Problem List   Diagnosis Date Noted  . Acute CHF (congestive heart failure) ([place]) 09/24/2020  . Acute exacerbation of CHF (congestive heart failure) ([place])  04/16/2020  . Acute on chronic diastolic (congestive) heart failure ([place]) 02/20/2020  . Hypokalemia   . Anxiety   . Physical deconditioning 03/13/2019  . Acute recurrent maxillary sinusitis 03/13/2019  . Pain due to onychomycosis of toenails of both feet 11/21/2018  . Shortness of breath 08/09/2018  . Asthma 06/19/2018  . Chronic respiratory failure with hypoxia ([place]) 05/15/2018  . Pulmonary hypertension ([place]) 05/14/2018  . Atypical chest pain 05/12/2018  . Community acquired pneumonia 12/13/2017  . CAP  (community acquired pneumonia) 12/13/2017  . CHF (congestive heart failure) ([place]) 12/12/2017  . Morbid obesity ([place]) 11/21/2017  . Flu 07/07/2017  . Influenza A 07/05/2017  . Acute respiratory failure with hypoxia ([place]) 07/05/2017  . Ascites   . Acute on chronic diastolic heart failure ([place]) 11/17/2016  . CKD (chronic kidney disease), stage III ([place]) 11/17/2016  . S/P laparoscopic cholecystectomy 03/02/2015  . Hyperlipidemia 11/25/2012  . Depression 11/25/2012  . GERD (gastroesophageal reflux disease) 11/25/2012  . Postoperative anemia due to acute blood loss 11/01/2012  . Osteoarthritis of right knee 10/31/2012  . Hyperglycemia 11/30/2011  . SBP (spontaneous bacterial peritonitis) ([place]) 11/25/2011    Class: Acute  . Syncope and collapse 11/20/2011  . Abdominal pain 11/19/2011  . Hyponatremia 11/19/2011  . Anemia 11/19/2011  . Acute kidney injury superimposed on CKD ([place]) 11/19/2011  . Moderate protein-calorie malnutrition ([place]) 11/19/2011  . Essential hypertension 11/19/2011  . Hypothyroidism, acquired 11/19/2011  . Dyslipidemia 11/19/2011  . Diabetes ([place]) 07/15/2011   PCP:  Mayra Neer, MD Pharmacy:   [facility] [facility], [place] - [address] [place] [address] Phone: [phone] Fax: [phone]     Social Determinants of Health (SDOH) Interventions    Readmission Risk Interventions Readmission Risk Prevention Plan 02/26/2020  Transportation Screening Complete  Medication Review ([place]) Complete  PCP or Specialist appointment within 3-5 days of discharge Complete  SW Recovery Care/Counseling Consult Complete  [place] Patient Refused  Some recent data might be hidden

## 2020-09-30 NOTE — Progress Notes (Signed)
Progress Note    Amanda Cain  IZT:[id number] DOB: January 09, 1946  DOA: 09/24/2020 PCP: Mayra Neer, MD    Brief Narrative:   Medical records reviewed and are as summarized below:  Amanda Cain is an 75 y.o. female with history of chronic diastolic CHF last EF measured in September 2021 was 47 to 65% with grade 2 diastolic dysfunction with history of pulmonary hypertension, asthma, chronic kidney disease stage IV, diabetes mellitus, anemia, hypertension, hypothyroidism was noticing increasing lower EXTR edema over the last few days and exertional shortness of breath.  Assessment/Plan:   Principal Problem:   Acute on chronic diastolic heart failure (HCC) Active Problems:   Essential hypertension   Hypothyroidism, acquired   CKD (chronic kidney disease), stage III (HCC)   CHF (congestive heart failure) (HCC)   Pulmonary hypertension (HCC)   Acute decompensated HFpEF/who grade 2 pulmonary hypertension Cath 2019 elevated pressures dry weight 221 Hold off all diuretics-liberalize fluid intake to about 2 to 2-1/2 L p.o. today Cut back dose Toprol-XL-continue Cardizem 360 continue hydralazine 25 3 times daily  CKD 4 with anemia of renal disease nephology consult: Resume torsemide at 40mg /d, stop KCL  Inflamed sebaceous cyst status post treatment in outpatient setting Warm compressed Doxycycline started back General surgery saw patient 5/3 and do not think any procedures are recommended for this at this time  DM TY 2 nephropathy neuropathy Continue Lantus 10 and sliding scale Continue gabapentin 100 twice daily  Severe reflux Continue Carafate 1 g daily, Protonix 40 daily  obesity Body mass index is 43.9 kg/m.   Attempted to d/c patient but family expressed concern about her living at home by self-- [facility] consult.  Sister reported this concern to Dr. Verlon Au as well.    Family Communication/Anticipated D/C date and plan/Code Status    Code Status: Full Code.   Family Communication: sister at bedside Disposition Plan: Status is: Inpatient  Remains inpatient appropriate because:Inpatient level of care appropriate due to severity of illness   Dispo: The patient is from: Home              Anticipated d/c is to: Home              Patient currently is not medically stable to d/c.   Difficult to place patient No         Medical Consultants:    renal  Subjective:   Wants to go home- says she has [facility] home care  Objective:    Vitals:   09/30/20 0608 09/30/20 0800 09/30/20 0825 09/30/20 1100  BP: (!) 152/59 (!) 144/70  (!) 146/72  Pulse:  61 (!) 59 62  Resp:  15 16 20   Temp:  97.8 F (36.6 C)  97.8 F (36.6 C)  TempSrc:  Oral  Oral  SpO2:  97% 100% 96%  Weight:      Height:        Intake/Output Summary (Last 24 hours) at 09/30/2020 1440 Last data filed at 09/30/2020 1115 Gross per 24 hour  Intake 260 ml  Output 1350 ml  Net -1090 ml   Filed Weights   09/27/20 0300 09/29/20 0300 09/30/20 0432  Weight: 97.6 kg 103.1 kg 98.6 kg    Exam:  General: Appearance:    Severely obese female in no acute distress     Lungs:     Clear to auscultation bilaterally, respirations unlabored  Heart:    Normal heart rate. Normal rhythm. No murmurs, rubs, or gallops.  MS:   All extremities are intact.   Neurologic:   Awake, alert, pleasant and cooperative    Data Reviewed:   I have personally reviewed following labs and imaging studies:  Labs: Labs show the following:   Basic Metabolic Panel: Recent Labs  Lab 09/25/20 0354 09/26/20 0745 09/27/20 0020 09/28/20 0041 09/29/20 0135 09/30/20 0105  NA 136 136 130* 127* 129* 132*  K 3.7 3.7 4.3 4.4 5.0 5.7*  CL 99 99 95* 92* 96* 103  CO2 26 28 26 26 23 23   GLUCOSE 72 118* 113* 120* 129* 109*  BUN 54* 59* 64* 70* 71* 66*  CREATININE 2.45* 3.12* 3.36* 3.90* 3.44* 3.00*  CALCIUM 9.6 8.9 8.5* 8.3* 8.4* 8.7*  MG 1.9  --   --   --   --   --    GFR Estimated Creatinine  Clearance: 16.5 mL/min (A) (by C-G formula based on SCr of 3 mg/dL (H)). Liver Function Tests: Recent Labs  Lab 09/26/20 0745 09/27/20 0020 09/28/20 0041 09/29/20 0135 09/30/20 0105  AST 15 15 18 16 15   ALT 17 18 18 17 18   ALKPHOS 54 54 61 58 59  BILITOT 0.4 0.8 0.5 0.1* 0.3  PROT 6.3* 5.8* 6.1* 6.0* 6.2*  ALBUMIN 3.2* 3.0* 3.1* 3.1* 3.1*   No results for input(s): LIPASE, AMYLASE in the last 168 hours. No results for input(s): AMMONIA in the last 168 hours. Coagulation profile No results for input(s): INR, PROTIME in the last 168 hours.  CBC: Recent Labs  Lab 09/25/20 0354 09/25/20 0712 09/27/20 0020 09/28/20 0041 09/29/20 0135 09/30/20 0105  WBC 10.1 9.4 8.1 7.2 7.7 7.4  NEUTROABS 7.6  --  5.9 5.1 5.6 5.5  HGB 11.0* 10.9* 9.7* 10.0* 9.4* 9.9*  HCT 35.2* 34.8* 30.9* 31.1* 30.0* 31.4*  MCV 89.3 88.1 89.3 87.6 88.5 88.5  PLT 362 328 342 319 319 305   Cardiac Enzymes: No results for input(s): CKTOTAL, CKMB, CKMBINDEX, TROPONINI in the last 168 hours. BNP (last 3 results) No results for input(s): PROBNP in the last 8760 hours. CBG: Recent Labs  Lab 09/29/20 1151 09/29/20 1642 09/29/20 2104 09/30/20 0637 09/30/20 1127  GLUCAP 118* 134* 120* 100* 174*   D-Dimer: No results for input(s): DDIMER in the last 72 hours. Hgb A1c: No results for input(s): HGBA1C in the last 72 hours. Lipid Profile: No results for input(s): CHOL, HDL, LDLCALC, TRIG, CHOLHDL, LDLDIRECT in the last 72 hours. Thyroid function studies: No results for input(s): TSH, T4TOTAL, T3FREE, THYROIDAB in the last 72 hours.  Invalid input(s): FREET3 Anemia work up: No results for input(s): VITAMINB12, FOLATE, FERRITIN, TIBC, IRON, RETICCTPCT in the last 72 hours. Sepsis Labs: Recent Labs  Lab 09/27/20 0020 09/28/20 0041 09/29/20 0135 09/30/20 0105  WBC 8.1 7.2 7.7 7.4    Microbiology Recent Results (from the past 240 hour(s))  SARS CORONAVIRUS 2 (TAT 6-24 HRS) Nasopharyngeal  Nasopharyngeal Swab     Status: None   Collection Time: 09/24/20  9:51 PM   Specimen: Nasopharyngeal Swab  Result Value Ref Range Status   SARS Coronavirus 2 NEGATIVE NEGATIVE Final    Comment: (NOTE) SARS-CoV-2 target nucleic acids are NOT DETECTED.  The SARS-CoV-2 RNA is generally detectable in upper and lower respiratory specimens during the acute phase of infection. Negative results do not preclude SARS-CoV-2 infection, do not rule out co-infections with other pathogens, and should not be used as the sole basis for treatment or other patient management decisions. Negative results must be combined  with clinical observations, patient history, and epidemiological information. The expected result is Negative.  Fact Sheet for Patients: SugarRoll.be  Fact Sheet for Healthcare Providers: https://www.woods-mathews.com/  This test is not yet approved or cleared by the [place] FDA and  has been authorized for detection and/or diagnosis of SARS-CoV-2 by FDA under an Emergency Use Authorization (EUA). This EUA will remain  in effect (meaning this test can be used) for the duration of the COVID-19 declaration under Se ction 564(b)(1) of the Act, 21 U.S.C. section 360bbb-3(b)(1), unless the authorization is terminated or revoked sooner.  Performed at [facility], [place] [address] [place], [place] [address]   MRSA PCR Screening     Status: None   Collection Time: 09/25/20  5:17 AM   Specimen: Nasopharyngeal  Result Value Ref Range Status   MRSA by PCR NEGATIVE NEGATIVE Final    Comment:        The GeneXpert MRSA Assay (FDA approved for NASAL specimens only), is one component of a comprehensive MRSA colonization surveillance program. It is not intended to diagnose MRSA infection nor to guide or monitor treatment for MRSA infections. Performed at [facility], [place] [address], [place], [place] [address]     Procedures  and diagnostic studies:  No results found.  Medications:   . atorvastatin  80 mg Oral Daily  . cholecalciferol  1,000 Units Oral Daily  . diltiazem  360 mg Oral Daily  . doxycycline  100 mg Oral Q12H  . enoxaparin (LOVENOX) injection  30 mg Subcutaneous Q24H  . escitalopram  20 mg Oral QHS  . ezetimibe  10 mg Oral Daily  . fluticasone furoate-vilanterol  1 puff Inhalation Daily  . gabapentin  100 mg Oral BID  . Gerhardt's butt cream   Topical QID  . guaiFENesin  600 mg Oral Daily  . hydrALAZINE  25 mg Oral Q8H  . insulin aspart  0-9 Units Subcutaneous TID WC  . insulin glargine  10 Units Subcutaneous Daily  . levothyroxine  200 mcg Oral Q0600  . LORazepam  0.25 mg Oral q AM  . LORazepam  0.25 mg Oral Q lunch  . metoprolol succinate  50 mg Oral q morning  . montelukast  10 mg Oral QHS  . paliperidone  3 mg Oral QHS  . pantoprazole  40 mg Oral Daily  . saccharomyces boulardii  250 mg Oral BID  . sucralfate  1 g Oral QPC breakfast  . torsemide  40 mg Oral Daily  . traMADol  50 mg Oral Q12H  . vitamin B-12  1,000 mcg Oral Daily   Continuous Infusions:   LOS: 6 days   Geradine Girt  [facility]   How to contact the [facility] Attending or Consulting provider [place] or covering provider during after hours [place], for this patient?  1. Check the care team in [facility] and look for a) attending/consulting TRH provider listed and b) the [facility] team listed 2. Log into [email or web address] and use 's universal password to access. If you do not have the password, please contact the hospital operator. 3. Locate the [facility] provider you are looking for under [facility] and page to a number that you can be directly reached. 4. If you still have difficulty reaching the provider, please page the [facility] (Director on Call) for the Hospitalists listed on amion for assistance.  09/30/2020, 2:40 PM

## 2020-09-30 NOTE — NC FL2 (Deleted)
[place] LEVEL OF CARE SCREENING TOOL     IDENTIFICATION  Patient Name: Amanda Cain Birthdate: 1945/08/21 Sex: female Admission Date (Current Location): 09/24/2020  [facility] and [place] Number:      Facility and Address:         Provider Number:    Attending Physician Name and Address:  Geradine Girt, DO  Relative Name and Phone Number:       Current Level of Care:   Recommended Level of Care:   Prior Approval Number:    Date Approved/Denied:   PASRR Number:    Discharge Plan:      Current Diagnoses: Patient Active Problem List   Diagnosis Date Noted  . Acute CHF (congestive heart failure) ([place]) 09/24/2020  . Acute exacerbation of CHF (congestive heart failure) ([place]) 04/16/2020  . Acute on chronic diastolic (congestive) heart failure ([place]) 02/20/2020  . Hypokalemia   . Anxiety   . Physical deconditioning 03/13/2019  . Acute recurrent maxillary sinusitis 03/13/2019  . Pain due to onychomycosis of toenails of both feet 11/21/2018  . Shortness of breath 08/09/2018  . Asthma 06/19/2018  . Chronic respiratory failure with hypoxia ([place]) 05/15/2018  . Pulmonary hypertension ([place]) 05/14/2018  . Atypical chest pain 05/12/2018  . Community acquired pneumonia 12/13/2017  . CAP (community acquired pneumonia) 12/13/2017  . CHF (congestive heart failure) ([place]) 12/12/2017  . Morbid obesity ([place]) 11/21/2017  . Flu 07/07/2017  . Influenza A 07/05/2017  . Acute respiratory failure with hypoxia ([place]) 07/05/2017  . Ascites   . Acute on chronic diastolic heart failure ([place]) 11/17/2016  . CKD (chronic kidney disease), stage III ([place]) 11/17/2016  . S/P laparoscopic cholecystectomy 03/02/2015  . Hyperlipidemia 11/25/2012  . Depression 11/25/2012  . GERD (gastroesophageal reflux disease) 11/25/2012  . Postoperative anemia due to acute blood loss 11/01/2012  . Osteoarthritis of right knee 10/31/2012  . Hyperglycemia 11/30/2011  . SBP (spontaneous  bacterial peritonitis) ([place]) 11/25/2011  . Syncope and collapse 11/20/2011  . Abdominal pain 11/19/2011  . Hyponatremia 11/19/2011  . Anemia 11/19/2011  . Acute kidney injury superimposed on CKD ([place]) 11/19/2011  . Moderate protein-calorie malnutrition ([place]) 11/19/2011  . Essential hypertension 11/19/2011  . Hypothyroidism, acquired 11/19/2011  . Dyslipidemia 11/19/2011  . Diabetes ([place]) 07/15/2011    Orientation RESPIRATION BLADDER Height & Weight            Weight: 217 lb 6 oz (98.6 kg) Height:  4\' 11"  (149.9 cm)  BEHAVIORAL SYMPTOMS/MOOD NEUROLOGICAL BOWEL NUTRITION STATUS           AMBULATORY STATUS COMMUNICATION OF NEEDS Skin                               Personal Care Assistance Level of Assistance              Functional Limitations Info             SPECIAL CARE FACTORS FREQUENCY                       Contractures      Additional Factors Info                  Current Medications (09/30/2020):  This is the current hospital active medication list Current Facility-Administered Medications  Medication Dose Route Frequency Provider Last Rate Last Admin  . acetaminophen (TYLENOL) tablet 650 mg  650 mg Oral Q6H  PRN Rise Patience, MD   650 mg at 09/27/20 1836   Or  . acetaminophen (TYLENOL) suppository 650 mg  650 mg Rectal Q6H PRN Rise Patience, MD      . albuterol (VENTOLIN HFA) 108 (90 Base) MCG/ACT inhaler 2 puff  2 puff Inhalation Q4H PRN Rise Patience, MD   2 puff at 09/28/20 2130  . atorvastatin (LIPITOR) tablet 80 mg  80 mg Oral Daily Rise Patience, MD   80 mg at 09/30/20 1057  . cholecalciferol (VITAMIN D3) tablet 1,000 Units  1,000 Units Oral Daily Rise Patience, MD   1,000 Units at 09/30/20 1057  . diltiazem (CARDIZEM CD) 24 hr capsule 360 mg  360 mg Oral Daily Rise Patience, MD   360 mg at 09/30/20 1057  . doxycycline (VIBRA-TABS) tablet 100 mg  100 mg Oral Q12H Nita Sells, MD   100  mg at 09/30/20 1056  . enoxaparin (LOVENOX) injection 30 mg  30 mg Subcutaneous Q24H Rise Patience, MD   30 mg at 09/30/20 1056  . escitalopram (LEXAPRO) tablet 20 mg  20 mg Oral QHS Rise Patience, MD   20 mg at 09/29/20 2219  . ezetimibe (ZETIA) tablet 10 mg  10 mg Oral Daily Rise Patience, MD   10 mg at 09/30/20 1057  . fluticasone furoate-vilanterol (BREO ELLIPTA) 200-25 MCG/INH 1 puff  1 puff Inhalation Daily Rise Patience, MD   1 puff at 09/30/20 0825  . gabapentin (NEURONTIN) capsule 100 mg  100 mg Oral BID Rise Patience, MD   100 mg at 09/30/20 1057  . Gerhardt's butt cream   Topical QID Nita Sells, MD   Given at 09/30/20 1459  . guaiFENesin (MUCINEX) 12 hr tablet 600 mg  600 mg Oral Daily Rise Patience, MD   600 mg at 09/30/20 1057  . hydrALAZINE (APRESOLINE) injection 10 mg  10 mg Intravenous Q4H PRN Rise Patience, MD   10 mg at 09/25/20 0651  . hydrALAZINE (APRESOLINE) tablet 25 mg  25 mg Oral Q8H Rise Patience, MD   25 mg at 09/30/20 1459  . insulin aspart (novoLOG) injection 0-9 Units  0-9 Units Subcutaneous TID WC Rise Patience, MD   2 Units at 09/30/20 1137  . insulin glargine (LANTUS) injection 10 Units  10 Units Subcutaneous Daily Rise Patience, MD   10 Units at 09/30/20 1057  . levothyroxine (SYNTHROID) tablet 200 mcg  200 mcg Oral Q0600 Rise Patience, MD   200 mcg at 09/30/20 9357  . LORazepam (ATIVAN) tablet 0.25 mg  0.25 mg Oral q AM Nita Sells, MD   0.25 mg at 09/30/20 0608  . LORazepam (ATIVAN) tablet 0.25 mg  0.25 mg Oral Q lunch Nita Sells, MD   0.25 mg at 09/30/20 1137  . metoprolol succinate (TOPROL-XL) 24 hr tablet 50 mg  50 mg Oral q morning Nita Sells, MD   50 mg at 09/30/20 1137  . montelukast (SINGULAIR) tablet 10 mg  10 mg Oral QHS Rise Patience, MD   10 mg at 09/29/20 2218  . paliperidone (INVEGA) 24 hr tablet 3 mg  3 mg Oral QHS Rise Patience, MD   3 mg at 09/29/20 2218  . pantoprazole (PROTONIX) EC tablet 40 mg  40 mg Oral Daily Rise Patience, MD   40 mg at 09/30/20 1057  . polyvinyl alcohol (LIQUIFILM TEARS) 1.4 % ophthalmic solution 1 drop  1  drop Both Eyes PRN Nita Sells, MD   1 drop at 09/28/20 0919  . saccharomyces boulardii (FLORASTOR) capsule 250 mg  250 mg Oral BID Rise Patience, MD   250 mg at 09/30/20 1057  . sodium chloride (OCEAN) 0.65 % nasal spray 1 spray  1 spray Each Nare PRN Nita Sells, MD   1 spray at 09/28/20 0920  . sucralfate (CARAFATE) tablet 1 g  1 g Oral QPC breakfast Rise Patience, MD   1 g at 09/30/20 1057  . torsemide (DEMADEX) tablet 40 mg  40 mg Oral Daily Pearson Grippe B, MD   40 mg at 09/30/20 1103  . traMADol (ULTRAM) tablet 50 mg  50 mg Oral Q12H Nita Sells, MD   50 mg at 09/30/20 1057  . vitamin B-12 (CYANOCOBALAMIN) tablet 1,000 mcg  1,000 mcg Oral Daily Rise Patience, MD   1,000 mcg at 09/30/20 1057     Discharge Medications: Please see discharge summary for a list of discharge medications.  Relevant Imaging Results:  Relevant Lab Results:   Additional Information    Ranny Wiebelhaus Renold Don, LCSWA

## 2020-09-30 NOTE — Progress Notes (Signed)
Physical Therapy Treatment Patient Details Name: Amanda Cain MRN: [id number] DOB: [birth year] Today's Date: 09/30/2020    History of Present Illness Pt is a 75 y.o. F who presents with increased BLE edema, weight gain and exertional SOB. Admitted with acute on chronic diastolic CHF with history of pulmonary hypertension. Also noted to have infected sebaceous cyst.  Significant PMH: CKD stage IV, T2DM, HLD, HTN, morbid obesity, depression.    PT Comments    Pt received in recliner, agreeable to therapy session with encouragement and with good participation and tolerance for transfer, gait training and safety instruction. Pt c/o sacral discomfort and instructed on pressure relief strategies in chair for posture/positioning and needed up to minA for safe gait/transfer training with RW in <household distances. Pt reports 5/10 modified RPE (fatigue) at end of session. Pt continues to benefit from PT services to progress toward functional mobility goals. Discharge recommendations updated per discussion with pt and supervising PT Caroline B.   Follow Up Recommendations  Supervision for mobility/OOB;SNF;Supervision/Assistance - 24 hour (if pt refuses SNF, will benefit from HHPT.)     Equipment Recommendations  None recommended by PT;Rolling walker with 5" wheels (youth size bariatric RW, bariatric 3 in 1)    Recommendations for Other Services       Precautions / Restrictions Precautions Precautions: Fall Restrictions Weight Bearing Restrictions: No    Mobility  Bed Mobility Overal bed mobility: Needs Assistance Bed Mobility: Rolling Rolling: Min guard         General bed mobility comments: cues for rolling to L/R sides for pressure relief in recliner, pt able to demo back but needs tcs/vcs for technique    Transfers Overall transfer level: Needs assistance Equipment used: Rolling walker (2 wheeled) Transfers: Sit to/from Stand Sit to Stand: Min guard;Min assist          General transfer comment: min guard from chair initially but needs minA to stand from lower toilet height to RW and for stability upon standing  Ambulation/Gait Ambulation/Gait assistance: Min guard;Min assist Gait Distance (Feet): 32 Feet (47ft x2 to/from toilet, then 39ft in room) Assistive device: Rolling walker (2 wheeled) Gait Pattern/deviations: Decreased stride length;Decreased dorsiflexion - right;Decreased dorsiflexion - left;Step-to pattern;Shuffle Gait velocity: decreased   General Gait Details: Cues for walker proximity, pt with noted decreased bilateral foot clearance. Fatigues easily, increased WOB and SpO2 89-95% on RA with exertion; poor management of RW especially just prior to sitting and she tends to abandon RW in tight spaces rather than keeping it close to her.      Balance Overall balance assessment: Needs assistance Sitting-balance support: Feet supported Sitting balance-Leahy Scale: Good     Standing balance support: No upper extremity supported;During functional activity Standing balance-Leahy Scale: Poor Standing balance comment: needs at least +1 assist for static/dynamic standing balance with AD        Cognition Arousal/Alertness: Awake/alert Behavior During Therapy: WFL for tasks assessed/performed Overall Cognitive Status: Within Functional Limits for tasks assessed   General Comments: participatory as able but decreased safety awareness and insight into deconditioning; poor carryover of safety cues      Exercises Other Exercises Other Exercises: supine BLE AROM: ankle pumps, quad sets, hip abduction x5-10 reps ea; HEP handout given for "General Strengthening"    General Comments General comments (skin integrity, edema, etc.): BP 152/66 (91) reclined pre-mobility and BP 128/44 (69) post-mobility (new larger cuff obtained for second reading); SpO2 89-95% on RA with exertion and HR 60 bpm resting and up to  70's bpm with exertion      Pertinent  Vitals/Pain Pain Assessment: Faces Faces Pain Scale: Hurts a little bit Pain Location: sacral discomfort Pain Descriptors / Indicators: Discomfort Pain Intervention(s): Monitored during session;Repositioned;Other (comment) (reviewed pressure relief strategies with pt for chair posture)           PT Goals (current goals can now be found in the care plan section) Acute Rehab PT Goals Patient Stated Goal: improve activity tolerance PT Goal Formulation: With patient Time For Goal Achievement: 10/13/20 Potential to Achieve Goals: Good Progress towards PT goals: Progressing toward goals    Frequency    Min 3X/week      PT Plan Discharge plan needs to be updated;Equipment recommendations need to be updated       AM-PAC PT "6 Clicks" Mobility   Outcome Measure  Help needed turning from your back to your side while in a flat bed without using bedrails?: A Little Help needed moving from lying on your back to sitting on the side of a flat bed without using bedrails?: A Lot Help needed moving to and from a bed to a chair (including a wheelchair)?: A Little Help needed standing up from a chair using your arms (e.g., wheelchair or bedside chair)?: A Little Help needed to walk in hospital room?: A Little Help needed climbing 3-5 steps with a railing? : A Lot 6 Click Score: 16    End of Session Equipment Utilized During Treatment: Gait belt Activity Tolerance: Patient limited by fatigue;Patient tolerated treatment well Patient left: with call bell/phone within reach;in chair;Other (comment) (RN/CNA/Case manager notified of DC rec change) Nurse Communication: Mobility status PT Visit Diagnosis: Unsteadiness on feet (R26.81);Muscle weakness (generalized) (M62.81);History of falling (Z91.81);Difficulty in walking, not elsewhere classified (R26.2)     Time: 4174-0814 PT Time Calculation (min) (ACUTE ONLY): 35 min  Charges:  $Gait Training: 8-22 mins $Therapeutic Activity: 8-22  mins                     Zaynah Chawla P., PTA Acute Rehabilitation Services Pager: [phone] Office: [place] 09/30/2020, 2:48 PM

## 2020-10-01 DIAGNOSIS — N183 Chronic kidney disease, stage 3 unspecified: Secondary | ICD-10-CM | POA: Diagnosis not present

## 2020-10-01 DIAGNOSIS — Z743 Need for continuous supervision: Secondary | ICD-10-CM | POA: Diagnosis not present

## 2020-10-01 DIAGNOSIS — R0600 Dyspnea, unspecified: Secondary | ICD-10-CM | POA: Diagnosis not present

## 2020-10-01 DIAGNOSIS — E1140 Type 2 diabetes mellitus with diabetic neuropathy, unspecified: Secondary | ICD-10-CM | POA: Diagnosis not present

## 2020-10-01 DIAGNOSIS — I509 Heart failure, unspecified: Secondary | ICD-10-CM | POA: Diagnosis not present

## 2020-10-01 DIAGNOSIS — M6281 Muscle weakness (generalized): Secondary | ICD-10-CM | POA: Diagnosis not present

## 2020-10-01 DIAGNOSIS — K219 Gastro-esophageal reflux disease without esophagitis: Secondary | ICD-10-CM | POA: Diagnosis not present

## 2020-10-01 DIAGNOSIS — R2689 Other abnormalities of gait and mobility: Secondary | ICD-10-CM | POA: Diagnosis not present

## 2020-10-01 DIAGNOSIS — E039 Hypothyroidism, unspecified: Secondary | ICD-10-CM | POA: Diagnosis not present

## 2020-10-01 DIAGNOSIS — I272 Pulmonary hypertension, unspecified: Secondary | ICD-10-CM | POA: Diagnosis not present

## 2020-10-01 DIAGNOSIS — R279 Unspecified lack of coordination: Secondary | ICD-10-CM | POA: Diagnosis not present

## 2020-10-01 DIAGNOSIS — L723 Sebaceous cyst: Secondary | ICD-10-CM | POA: Diagnosis not present

## 2020-10-01 DIAGNOSIS — M1990 Unspecified osteoarthritis, unspecified site: Secondary | ICD-10-CM | POA: Diagnosis not present

## 2020-10-01 DIAGNOSIS — R269 Unspecified abnormalities of gait and mobility: Secondary | ICD-10-CM | POA: Diagnosis not present

## 2020-10-01 DIAGNOSIS — R0902 Hypoxemia: Secondary | ICD-10-CM | POA: Diagnosis not present

## 2020-10-01 DIAGNOSIS — Z9989 Dependence on other enabling machines and devices: Secondary | ICD-10-CM | POA: Diagnosis not present

## 2020-10-01 DIAGNOSIS — I5032 Chronic diastolic (congestive) heart failure: Secondary | ICD-10-CM | POA: Diagnosis not present

## 2020-10-01 DIAGNOSIS — N184 Chronic kidney disease, stage 4 (severe): Secondary | ICD-10-CM | POA: Diagnosis not present

## 2020-10-01 DIAGNOSIS — E1142 Type 2 diabetes mellitus with diabetic polyneuropathy: Secondary | ICD-10-CM | POA: Diagnosis not present

## 2020-10-01 DIAGNOSIS — I5031 Acute diastolic (congestive) heart failure: Secondary | ICD-10-CM | POA: Diagnosis not present

## 2020-10-01 DIAGNOSIS — R2681 Unsteadiness on feet: Secondary | ICD-10-CM | POA: Diagnosis not present

## 2020-10-01 DIAGNOSIS — L089 Local infection of the skin and subcutaneous tissue, unspecified: Secondary | ICD-10-CM | POA: Diagnosis not present

## 2020-10-01 DIAGNOSIS — R296 Repeated falls: Secondary | ICD-10-CM | POA: Diagnosis not present

## 2020-10-01 DIAGNOSIS — E118 Type 2 diabetes mellitus with unspecified complications: Secondary | ICD-10-CM | POA: Diagnosis not present

## 2020-10-01 DIAGNOSIS — Z5189 Encounter for other specified aftercare: Secondary | ICD-10-CM | POA: Diagnosis not present

## 2020-10-01 DIAGNOSIS — Z736 Limitation of activities due to disability: Secondary | ICD-10-CM | POA: Diagnosis not present

## 2020-10-01 DIAGNOSIS — I5033 Acute on chronic diastolic (congestive) heart failure: Secondary | ICD-10-CM | POA: Diagnosis not present

## 2020-10-01 DIAGNOSIS — L720 Epidermal cyst: Secondary | ICD-10-CM | POA: Diagnosis not present

## 2020-10-01 DIAGNOSIS — D649 Anemia, unspecified: Secondary | ICD-10-CM | POA: Diagnosis not present

## 2020-10-01 LAB — COMPREHENSIVE METABOLIC PANEL
ALT: 19 U/L (ref 0–44)
AST: 14 U/L — ABNORMAL LOW (ref 15–41)
Albumin: 3 g/dL — ABNORMAL LOW (ref 3.5–5.0)
Alkaline Phosphatase: 55 U/L (ref 38–126)
Anion gap: 7 (ref 5–15)
BUN: 65 mg/dL — ABNORMAL HIGH (ref 8–23)
CO2: 25 mmol/L (ref 22–32)
Calcium: 9 mg/dL (ref 8.9–10.3)
Chloride: 102 mmol/L (ref 98–111)
Creatinine, Ser: 3.05 mg/dL — ABNORMAL HIGH (ref 0.44–1.00)
GFR, Estimated: 15 mL/min — ABNORMAL LOW (ref >60)
Glucose, Bld: 95 mg/dL (ref 70–99)
Potassium: 5.2 mmol/L — ABNORMAL HIGH (ref 3.5–5.1)
Sodium: 134 mmol/L — ABNORMAL LOW (ref 135–145)
Total Bilirubin: 0.2 mg/dL — ABNORMAL LOW (ref 0.3–1.2)
Total Protein: 6 g/dL — ABNORMAL LOW (ref 6.5–8.1)

## 2020-10-01 LAB — CBC WITH DIFFERENTIAL/PLATELET
Abs Immature Granulocytes: 0.03 10*3/uL (ref 0.00–0.07)
Basophils Absolute: 0 10*3/uL (ref 0.0–0.1)
Basophils Relative: 1 %
Eosinophils Absolute: 0.2 10*3/uL (ref 0.0–0.5)
Eosinophils Relative: 3 %
HCT: 31.4 % — ABNORMAL LOW (ref 36.0–46.0)
Hemoglobin: 9.8 g/dL — ABNORMAL LOW (ref 12.0–15.0)
Immature Granulocytes: 0 %
Lymphocytes Relative: 12 %
Lymphs Abs: 0.9 10*3/uL (ref 0.7–4.0)
MCH: 27.8 pg (ref 26.0–34.0)
MCHC: 31.2 g/dL (ref 30.0–36.0)
MCV: 89.2 fL (ref 80.0–100.0)
Monocytes Absolute: 0.7 10*3/uL (ref 0.1–1.0)
Monocytes Relative: 10 %
Neutro Abs: 5.7 10*3/uL (ref 1.7–7.7)
Neutrophils Relative %: 74 %
Platelets: 335 10*3/uL (ref 150–400)
RBC: 3.52 MIL/uL — ABNORMAL LOW (ref 3.87–5.11)
RDW: 16.4 % — ABNORMAL HIGH (ref 11.5–15.5)
WBC: 7.6 10*3/uL (ref 4.0–10.5)
nRBC: 0 % (ref 0.0–0.2)

## 2020-10-01 LAB — GLUCOSE, CAPILLARY
Glucose-Capillary: 81 mg/dL (ref 70–99)
Glucose-Capillary: 100 mg/dL — ABNORMAL HIGH (ref 70–99)

## 2020-10-01 LAB — SARS CORONAVIRUS 2 (TAT 6-24 HRS): SARS Coronavirus 2: NEGATIVE

## 2020-10-01 MED ORDER — LORAZEPAM 0.5 MG PO TABS: 0.2500 mg | ORAL_TABLET | ORAL | 0 refills | Status: DC

## 2020-10-01 MED ORDER — METOPROLOL SUCCINATE ER 50 MG PO TB24: 50.0000 mg | ORAL_TABLET | Freq: Every morning | ORAL | Status: AC

## 2020-10-01 MED ORDER — TORSEMIDE 40 MG PO TABS: 40.0000 mg | ORAL_TABLET | Freq: Every day | ORAL | Status: DC

## 2020-10-01 MED ORDER — DOXYCYCLINE HYCLATE 100 MG PO TABS: 100.0000 mg | ORAL_TABLET | Freq: Two times a day (BID) | ORAL | Status: DC

## 2020-10-01 NOTE — Consult Note (Signed)
   [facility] CM Inpatient Consult   10/01/2020  Kinslee Dalpe Medero 12/20/1945 [id number]  [place] [facility] [ACO] Patient: [employer]  Follow up: Met with patient at bedside, sitting up in chair, regarding her post hospital needs.  She is currently hoping to go to skilled nursing facility for rehab.  She states her personal care services is 5 hours a day 7 days a week with [facility].  She is home alone from 1pm til 8 am the next day.  Plan:  If patient transitions to a skilled nursing facility her post hospital transition of care needs are to be met at a skilled nursing level of care.  If patient transitions home, [facility] community follow up can be arranged.  Gave patient a brochure and 24 hour nurse advise line magnet.  She endorses Dr. Mayra Neer, [facility] Physician, as her PCP and this provider office is listed to provide the [facility] calls and follow up. Will continue to follow til transition confirmed.  For questions, contact:  Natividad Brood, RN BSN [place] [facility] Liaison  [phone] business mobile phone Toll free office [phone]  Fax number: [phone] [email or web address]

## 2020-10-01 NOTE — Progress Notes (Signed)
Report given to camden Aspers . Awaiting PTAR  for transport.

## 2020-10-01 NOTE — Discharge Summary (Signed)
Physician Discharge Summary  [name] STM:[id number] DOB: June 12, 1945 DOA: 09/24/2020  PCP: Mayra Neer, MD  Admit date: 09/24/2020 Discharge date: 10/01/2020  Admitted From: home Discharge disposition: SNF   Recommendations for Outpatient Follow-Up:   1. BMP 1 week 2. Close renal follow up 3. Monitor blood sugars 4. Doxy through 5/7-- warm compresses to cyst-- outpatient referral for removal once inflammation gone   Discharge Diagnosis:   Principal Problem:   Acute on chronic diastolic heart failure (HCC) Active Problems:   Essential hypertension   Hypothyroidism, acquired   CKD (chronic kidney disease), stage III (HCC)   CHF (congestive heart failure) ([place])   Pulmonary hypertension ([place])    Discharge Condition: Improved.  Diet recommendation: Low sodium, heart healthy.   Wound care: None.  Code status: Full.   History of Present Illness:   Amanda Cain is a 75 y.o. female with history of chronic diastolic CHF last EF measured in September 2021 was 17 to 65% with grade 2 diastolic dysfunction with history of pulmonary hypertension, asthma, chronic kidney disease stage IV, diabetes mellitus, anemia, hypertension, hypothyroidism was noticing increasing lower EXTR edema over the last few days and exertional shortness of breath.  Denies any chest pain.  Patient's physical therapist called her primary cardiologist office and was instructed to come to the ER.  Patient states she has been compliant with her torsemide at home.  Patient states she has gained at least 10 pounds of weight.   Hospital Course by Problem:   Acute decompensated HFpEF/who grade 2 pulmonary hypertension Cath 2019 elevated pressures dry weight 221 - Toprol-XL-continue Cardizem 360 continue hydralazine 25 3 times daily  CKD 4 with anemia of renal disease nephology consult: Resume torsemide at 40mg /d, stop KCL  Inflamed sebaceous cyst status post treatment in outpatient  setting Warm compresses Doxycycline started back General surgerysaw patient 5/3 and do not think any procedures are recommended for this at this time  DM TY 2 nephropathy neuropathy Continue Lantus 10 and sliding scale Continue gabapentin 100 twice daily  Severe reflux Continue Carafate 1 g daily, Protonix 40 daily  obesity Body mass index is 43.9 kg/m.    Medical Consultants:   renal   Discharge Exam:   Vitals:   10/01/20 0733 10/01/20 0809  BP: (!) 141/62   Pulse: 68   Resp: (!) 23   Temp: (!) 96.9 F (36.1 C)   SpO2: 94% 98%   Vitals:   10/01/20 0300 10/01/20 0310 10/01/20 0733 10/01/20 0809  BP: 111/67  (!) 141/62   Pulse: 67  68   Resp: 14  (!) 23   Temp: 98 F (36.7 C)  (!) 96.9 F (36.1 C)   TempSrc: Oral  Axillary   SpO2: 92%  94% 98%  Weight:  98 kg    Height:        General exam: Appears calm and comfortable.   The results of significant diagnostics from this hospitalization (including imaging, microbiology, ancillary and laboratory) are listed below for reference.     Procedures and Diagnostic Studies:   DG Chest Port 1 View  Result Date: 09/24/2020 CLINICAL DATA:  Increased lower extremity edema and weight gain of about 10 pounds over the past 4 days. History of congestive heart failure and diabetes. EXAM: PORTABLE CHEST 1 VIEW COMPARISON:  08/18/2020 FINDINGS: Mild cardiac enlargement. No vascular congestion, edema, or consolidation in the lungs. No pleural effusions. No pneumothorax. Mediastinal contours appear intact. IMPRESSION: Mild cardiac enlargement.  Electronically Signed   By: Lucienne Capers M.D.   On: 09/24/2020 21:47     Labs:   Basic Metabolic Panel: Recent Labs  Lab 09/25/20 0354 09/26/20 0745 09/27/20 0020 09/28/20 0041 09/29/20 0135 09/30/20 0105 10/01/20 0124  NA 136   < > 130* 127* 129* 132* 134*  K 3.7   < > 4.3 4.4 5.0 5.7* 5.2*  CL 99   < > 95* 92* 96* 103 102  CO2 26   < > 26 26 23 23 25   GLUCOSE 72    < > 113* 120* 129* 109* 95  BUN 54*   < > 64* 70* 71* 66* 65*  CREATININE 2.45*   < > 3.36* 3.90* 3.44* 3.00* 3.05*  CALCIUM 9.6   < > 8.5* 8.3* 8.4* 8.7* 9.0  MG 1.9  --   --   --   --   --   --    < > = values in this interval not displayed.   GFR Estimated Creatinine Clearance: 16.1 mL/min (A) (by C-G formula based on SCr of 3.05 mg/dL (H)). Liver Function Tests: Recent Labs  Lab 09/27/20 0020 09/28/20 0041 09/29/20 0135 09/30/20 0105 10/01/20 0124  AST 15 18 16 15  14*  ALT 18 18 17 18 19   ALKPHOS 54 61 58 59 55  BILITOT 0.8 0.5 0.1* 0.3 0.2*  PROT 5.8* 6.1* 6.0* 6.2* 6.0*  ALBUMIN 3.0* 3.1* 3.1* 3.1* 3.0*   No results for input(s): LIPASE, AMYLASE in the last 168 hours. No results for input(s): AMMONIA in the last 168 hours. Coagulation profile No results for input(s): INR, PROTIME in the last 168 hours.  CBC: Recent Labs  Lab 09/27/20 0020 09/28/20 0041 09/29/20 0135 09/30/20 0105 10/01/20 0124  WBC 8.1 7.2 7.7 7.4 7.6  NEUTROABS 5.9 5.1 5.6 5.5 5.7  HGB 9.7* 10.0* 9.4* 9.9* 9.8*  HCT 30.9* 31.1* 30.0* 31.4* 31.4*  MCV 89.3 87.6 88.5 88.5 89.2  PLT 342 319 319 305 335   Cardiac Enzymes: No results for input(s): CKTOTAL, CKMB, CKMBINDEX, TROPONINI in the last 168 hours. BNP: Invalid input(s): POCBNP CBG: Recent Labs  Lab 09/30/20 0637 09/30/20 1127 09/30/20 1629 09/30/20 2109 10/01/20 0605  GLUCAP 100* 174* 126* 112* 81   D-Dimer No results for input(s): DDIMER in the last 72 hours. Hgb A1c No results for input(s): HGBA1C in the last 72 hours. Lipid Profile No results for input(s): CHOL, HDL, LDLCALC, TRIG, CHOLHDL, LDLDIRECT in the last 72 hours. Thyroid function studies No results for input(s): TSH, T4TOTAL, T3FREE, THYROIDAB in the last 72 hours.  Invalid input(s): FREET3 Anemia work up No results for input(s): VITAMINB12, FOLATE, FERRITIN, TIBC, IRON, RETICCTPCT in the last 72 hours. Microbiology Recent Results (from the past 240 hour(s))   SARS CORONAVIRUS 2 (TAT 6-24 HRS) Nasopharyngeal Nasopharyngeal Swab     Status: None   Collection Time: 09/24/20  9:51 PM   Specimen: Nasopharyngeal Swab  Result Value Ref Range Status   SARS Coronavirus 2 NEGATIVE NEGATIVE Final    Comment: (NOTE) SARS-CoV-2 target nucleic acids are NOT DETECTED.  The SARS-CoV-2 RNA is generally detectable in upper and lower respiratory specimens during the acute phase of infection. Negative results do not preclude SARS-CoV-2 infection, do not rule out co-infections with other pathogens, and should not be used as the sole basis for treatment or other patient management decisions. Negative results must be combined with clinical observations, patient history, and epidemiological information. The expected result is Negative.  Fact  Sheet for Patients: SugarRoll.be  Fact Sheet for Healthcare Providers: https://www.woods-mathews.com/  This test is not yet approved or cleared by the [place] FDA and  has been authorized for detection and/or diagnosis of SARS-CoV-2 by FDA under an Emergency Use Authorization (EUA). This EUA will remain  in effect (meaning this test can be used) for the duration of the COVID-19 declaration under Se ction 564(b)(1) of the Act, 21 U.S.C. section 360bbb-3(b)(1), unless the authorization is terminated or revoked sooner.  Performed at [facility], [place] [address] [place], [place] [address]   MRSA PCR Screening     Status: None   Collection Time: 09/25/20  5:17 AM   Specimen: Nasopharyngeal  Result Value Ref Range Status   MRSA by PCR NEGATIVE NEGATIVE Final    Comment:        The GeneXpert MRSA Assay (FDA approved for NASAL specimens only), is one component of a comprehensive MRSA colonization surveillance program. It is not intended to diagnose MRSA infection nor to guide or monitor treatment for MRSA infections. Performed at [facility], [place]  [address] [place], [place] [address]   SARS CORONAVIRUS 2 (TAT 6-24 HRS) Nasopharyngeal     Status: None   Collection Time: 09/30/20  4:15 PM   Specimen: Nasopharyngeal  Result Value Ref Range Status   SARS Coronavirus 2 NEGATIVE NEGATIVE Final    Comment: (NOTE) SARS-CoV-2 target nucleic acids are NOT DETECTED.  The SARS-CoV-2 RNA is generally detectable in upper and lower respiratory specimens during the acute phase of infection. Negative results do not preclude SARS-CoV-2 infection, do not rule out co-infections with other pathogens, and should not be used as the sole basis for treatment or other patient management decisions. Negative results must be combined with clinical observations, patient history, and epidemiological information. The expected result is Negative.  Fact Sheet for Patients: SugarRoll.be  Fact Sheet for Healthcare Providers: https://www.woods-mathews.com/  This test is not yet approved or cleared by the [place] FDA and  has been authorized for detection and/or diagnosis of SARS-CoV-2 by FDA under an Emergency Use Authorization (EUA). This EUA will remain  in effect (meaning this test can be used) for the duration of the COVID-19 declaration under Se ction 564(b)(1) of the Act, 21 U.S.C. section 360bbb-3(b)(1), unless the authorization is terminated or revoked sooner.  Performed at [facility], [place] [address] [place], [place] [address]      Discharge Instructions:   Discharge Instructions    Diet - low sodium heart healthy   Complete by: As directed    Diet Carb Modified   Complete by: As directed    Discharge instructions   Complete by: As directed    warm compresses to encourage drainage   Increase activity slowly   Complete by: As directed    No wound care   Complete by: As directed      Allergies as of 10/01/2020      Reactions   Ace Inhibitors    Other reaction(s): cough   Amlodipine     Dizziness per patient   Aspirin Other (See Comments)   " Have an ulcer"      Medication List    STOP taking these medications   estradiol 0.5 MG tablet Commonly known as: ESTRACE   potassium chloride SA 20 MEQ tablet Commonly known as: KLOR-CON   Victoza 18 MG/3ML Sopn Generic drug: liraglutide     TAKE these medications   acetaminophen 500 MG  tablet Commonly known as: TYLENOL Take 500 mg by mouth daily as needed for mild pain.   albuterol 108 (90 Base) MCG/ACT inhaler Commonly known as: VENTOLIN HFA Inhale 2 puffs into the lungs every 4 (four) hours as needed for wheezing or shortness of breath.   atorvastatin 80 MG tablet Commonly known as: LIPITOR Take 80 mg by mouth daily.   azelastine 0.1 % nasal spray Commonly known as: ASTELIN Place 1 spray into both nostrils 2 (two) times daily.   benzonatate 100 MG capsule Commonly known as: TESSALON Take 100 mg by mouth 3 (three) times daily.   Biotin 1000 MCG tablet Take 1,000 mcg by mouth daily.   Breo Ellipta 200-25 MCG/INH Aepb Generic drug: fluticasone furoate-vilanterol Inhale 1 puff into the lungs daily. INHALE 1 PUFF INTO THE LUNGS DAILY What changed: additional instructions   cholecalciferol 1000 units tablet Commonly known as: VITAMIN D Take 1,000 Units by mouth daily.   diltiazem 360 MG 24 hr capsule Commonly known as: CARDIZEM CD Take 360 mg by mouth daily.   doxycycline 100 MG tablet Commonly known as: VIBRA-TABS Take 1 tablet (100 mg total) by mouth every 12 (twelve) hours.   escitalopram 20 MG tablet Commonly known as: LEXAPRO Take 20 mg by mouth at bedtime.   ezetimibe 10 MG tablet Commonly known as: ZETIA Take 10 mg by mouth daily.   gabapentin 100 MG capsule Commonly known as: NEURONTIN Take 100 mg by mouth 2 (two) times daily.   Ginger Root 550 MG Caps Take 550 mg by mouth daily.   guaiFENesin 600 MG 12 hr tablet Commonly known as: MUCINEX Take 600 mg by mouth daily.    hydrALAZINE 25 MG tablet Commonly known as: APRESOLINE Take 1 tablet (25 mg total) by mouth every 8 (eight) hours.   levothyroxine 200 MCG tablet Commonly known as: SYNTHROID Take 200 mcg by mouth every morning.   LORazepam 0.5 MG tablet Commonly known as: ATIVAN Take 0.5-1 tablets (0.25-0.5 mg total) by mouth See admin instructions. 0.25 mg twice daily, 0.5 mg at bedtime What changed:   when to take this  additional instructions   metoprolol succinate 50 MG 24 hr tablet Commonly known as: TOPROL-XL Take 1 tablet (50 mg total) by mouth every morning. Take with or immediately following a meal. What changed:   medication strength  how much to take  additional instructions   montelukast 10 MG tablet Commonly known as: SINGULAIR Take 1 tablet (10 mg total) by mouth at bedtime. TAKE 1 TABLET(10 MG) BY MOUTH AT BEDTIME   multivitamin with minerals Tabs tablet Take 1 tablet by mouth daily.   OneTouch Verio test strip Generic drug: glucose blood 1 each by Other route 2 (two) times daily. and lancets 2/day   paliperidone 3 MG 24 hr tablet Commonly known as: INVEGA Take 3 mg by mouth at bedtime.   pantoprazole 40 MG tablet Commonly known as: PROTONIX TAKE 1 TABLET(40 MG) BY MOUTH DAILY What changed: See the new instructions.   Polyethyl Glycol-Propyl Glycol 0.4-0.3 % Soln Place 1 drop into both eyes 2 (two) times daily.   saccharomyces boulardii 250 MG capsule Commonly known as: FLORASTOR Take 1 capsule (250 mg total) by mouth 2 (two) times daily.   sucralfate 1 g tablet Commonly known as: CARAFATE Take 1 g by mouth daily.   Torsemide 40 MG Tabs Take 40 mg by mouth daily. What changed:   medication strength  how much to take   Tresiba FlexTouch 100 UNIT/ML  FlexTouch Pen Generic drug: insulin degludec Inject 10 Units into the skin daily.   vitamin B-12 1000 MCG tablet Commonly known as: CYANOCOBALAMIN Take 1,000 mcg by mouth daily.   vitamin C 500 MG  tablet Commonly known as: ASCORBIC ACID Take 500 mg by mouth daily.       Follow-up Information    Surgery, [place]. Go on 10/13/2020.   Specialty: General Surgery Why: 1:45 PM. Please arrive 15 min prior to appointment time. Bring photo ID and insurance information.  Contact information: [place] [address] [phone]        Rosita Fire, MD Follow up in 2 week(s).   Specialties: Nephrology, Internal Medicine Why: We will call with appt details Contact information: [address] [place] [address] [phone]                Time coordinating discharge: 35 min  Signed:  Geradine Girt DO  [facility] 10/01/2020, 8:14 AM

## 2020-10-01 NOTE — TOC Transition Note (Signed)
Transition of Care [facility]) - CM/SW Discharge Note   Patient Details  Name: Amanda Cain MRN: [id number] Date of Birth: [birth year]  [facility]) CM/SW Contact:  Tresa Endo Phone Number: 10/01/2020, 11:57 AM   Clinical Narrative:    Patient will DC to: [place] Anticipated DC date: 10/01/2020 Family notified: Pt sister Transport by: Corey Harold   Per MD patient ready for DC to [facility] Room 907P. RN to call report prior to discharge [phone]). RN, patient, patient's family, and facility notified of DC. Discharge Summary and FL2 sent to facility. DC packet on chart. Ambulance transport requested for patient.   CSW will sign off for now as social work intervention is no longer needed. Please consult [place] again if new needs arise.        Barriers to Discharge: Continued Medical Work up   Patient Goals and CMS Choice        Discharge Placement                       Discharge Plan and Services   Discharge Planning Services: CM Consult                                 Social Determinants of Health (SDOH) Interventions     Readmission Risk Interventions Readmission Risk Prevention Plan 02/26/2020  Transportation Screening Complete  Medication Review ([place]) Complete  PCP or Specialist appointment within 3-5 days of discharge Complete  SW Recovery Care/Counseling Consult Complete  [place] Patient Refused  Some recent data might be hidden

## 2020-10-01 NOTE — Progress Notes (Signed)
Discharged to [facility] by [facility] stable. Belongings with pt.

## 2020-10-01 NOTE — TOC Progression Note (Signed)
Transition of Care [facility]) - Progression Note    Patient Details  Name: Amanda Cain MRN: [id number] Date of Birth: 06-27-1945  Transition of Care [facility]) CM/SW Contact  Reece Agar, [place] Phone Number: 10/01/2020, 12:13 PM  Clinical Narrative:    12:00pm- Pt will transport to [place] today via [facility]. Josem Kaufmann has been approved ref# [id number] for 5 day and will be in room 907P at [facility]. COVID is negative and pt/sister Tamra Furbish) is aware of transport to [place] today.   Expected Discharge Plan: [place] Barriers to Discharge: Continued Medical Work up  Expected Discharge Plan and Services Expected Discharge Plan: [place]   Discharge Planning Services: CM Consult   Living arrangements for the past 2 months: Apartment Expected Discharge Date: 10/01/20                                     Social Determinants of Health (SDOH) Interventions    Readmission Risk Interventions Readmission Risk Prevention Plan 02/26/2020  Transportation Screening Complete  Medication Review [employer]) Complete  PCP or Specialist appointment within 3-5 days of discharge Complete  SW Recovery Care/Counseling Consult Complete  [place] Patient Refused  Some recent data might be hidden

## 2020-10-05 ENCOUNTER — Ambulatory Visit: Payer: Medicare Other | Admitting: Dietician

## 2020-10-05 DIAGNOSIS — D649 Anemia, unspecified: Secondary | ICD-10-CM | POA: Diagnosis not present

## 2020-10-05 DIAGNOSIS — I509 Heart failure, unspecified: Secondary | ICD-10-CM | POA: Diagnosis not present

## 2020-10-05 DIAGNOSIS — K219 Gastro-esophageal reflux disease without esophagitis: Secondary | ICD-10-CM | POA: Diagnosis not present

## 2020-10-05 DIAGNOSIS — E039 Hypothyroidism, unspecified: Secondary | ICD-10-CM | POA: Diagnosis not present

## 2020-10-05 DIAGNOSIS — N183 Chronic kidney disease, stage 3 unspecified: Secondary | ICD-10-CM | POA: Diagnosis not present

## 2020-10-05 DIAGNOSIS — R2681 Unsteadiness on feet: Secondary | ICD-10-CM | POA: Diagnosis not present

## 2020-10-05 DIAGNOSIS — E1142 Type 2 diabetes mellitus with diabetic polyneuropathy: Secondary | ICD-10-CM | POA: Diagnosis not present

## 2020-10-05 DIAGNOSIS — M1990 Unspecified osteoarthritis, unspecified site: Secondary | ICD-10-CM | POA: Diagnosis not present

## 2020-10-05 DIAGNOSIS — M6281 Muscle weakness (generalized): Secondary | ICD-10-CM | POA: Diagnosis not present

## 2020-10-06 ENCOUNTER — Ambulatory Visit: Payer: Medicare Other | Admitting: Endocrinology

## 2020-10-06 DIAGNOSIS — I5031 Acute diastolic (congestive) heart failure: Secondary | ICD-10-CM | POA: Diagnosis not present

## 2020-10-06 DIAGNOSIS — R269 Unspecified abnormalities of gait and mobility: Secondary | ICD-10-CM | POA: Diagnosis not present

## 2020-10-06 DIAGNOSIS — Z9989 Dependence on other enabling machines and devices: Secondary | ICD-10-CM | POA: Diagnosis not present

## 2020-10-06 DIAGNOSIS — E118 Type 2 diabetes mellitus with unspecified complications: Secondary | ICD-10-CM | POA: Diagnosis not present

## 2020-10-08 DIAGNOSIS — M1990 Unspecified osteoarthritis, unspecified site: Secondary | ICD-10-CM | POA: Diagnosis not present

## 2020-10-08 DIAGNOSIS — E1142 Type 2 diabetes mellitus with diabetic polyneuropathy: Secondary | ICD-10-CM | POA: Diagnosis not present

## 2020-10-08 DIAGNOSIS — R2681 Unsteadiness on feet: Secondary | ICD-10-CM | POA: Diagnosis not present

## 2020-10-08 DIAGNOSIS — N183 Chronic kidney disease, stage 3 unspecified: Secondary | ICD-10-CM | POA: Diagnosis not present

## 2020-10-08 DIAGNOSIS — E039 Hypothyroidism, unspecified: Secondary | ICD-10-CM | POA: Diagnosis not present

## 2020-10-08 DIAGNOSIS — D649 Anemia, unspecified: Secondary | ICD-10-CM | POA: Diagnosis not present

## 2020-10-08 DIAGNOSIS — K219 Gastro-esophageal reflux disease without esophagitis: Secondary | ICD-10-CM | POA: Diagnosis not present

## 2020-10-08 DIAGNOSIS — I509 Heart failure, unspecified: Secondary | ICD-10-CM | POA: Diagnosis not present

## 2020-10-08 DIAGNOSIS — M6281 Muscle weakness (generalized): Secondary | ICD-10-CM | POA: Diagnosis not present

## 2020-10-12 DIAGNOSIS — I5032 Chronic diastolic (congestive) heart failure: Secondary | ICD-10-CM | POA: Diagnosis not present

## 2020-10-12 DIAGNOSIS — D649 Anemia, unspecified: Secondary | ICD-10-CM | POA: Diagnosis not present

## 2020-10-12 DIAGNOSIS — E1140 Type 2 diabetes mellitus with diabetic neuropathy, unspecified: Secondary | ICD-10-CM | POA: Diagnosis not present

## 2020-10-12 DIAGNOSIS — E1142 Type 2 diabetes mellitus with diabetic polyneuropathy: Secondary | ICD-10-CM | POA: Diagnosis not present

## 2020-10-12 DIAGNOSIS — L720 Epidermal cyst: Secondary | ICD-10-CM | POA: Diagnosis not present

## 2020-10-12 DIAGNOSIS — N183 Chronic kidney disease, stage 3 unspecified: Secondary | ICD-10-CM | POA: Diagnosis not present

## 2020-10-12 DIAGNOSIS — R2681 Unsteadiness on feet: Secondary | ICD-10-CM | POA: Diagnosis not present

## 2020-10-12 DIAGNOSIS — I509 Heart failure, unspecified: Secondary | ICD-10-CM | POA: Diagnosis not present

## 2020-10-12 DIAGNOSIS — M1990 Unspecified osteoarthritis, unspecified site: Secondary | ICD-10-CM | POA: Diagnosis not present

## 2020-10-12 DIAGNOSIS — M6281 Muscle weakness (generalized): Secondary | ICD-10-CM | POA: Diagnosis not present

## 2020-10-12 DIAGNOSIS — E039 Hypothyroidism, unspecified: Secondary | ICD-10-CM | POA: Diagnosis not present

## 2020-10-12 DIAGNOSIS — K219 Gastro-esophageal reflux disease without esophagitis: Secondary | ICD-10-CM | POA: Diagnosis not present

## 2020-10-13 ENCOUNTER — Ambulatory Visit: Payer: Medicare Other | Admitting: Endocrinology

## 2020-10-14 ENCOUNTER — Ambulatory Visit: Payer: Medicare Other | Admitting: Podiatry

## 2020-10-15 DIAGNOSIS — Z5189 Encounter for other specified aftercare: Secondary | ICD-10-CM | POA: Diagnosis not present

## 2020-10-15 DIAGNOSIS — L089 Local infection of the skin and subcutaneous tissue, unspecified: Secondary | ICD-10-CM | POA: Diagnosis not present

## 2020-10-15 DIAGNOSIS — L723 Sebaceous cyst: Secondary | ICD-10-CM | POA: Diagnosis not present

## 2020-10-16 DIAGNOSIS — F32A Depression, unspecified: Secondary | ICD-10-CM | POA: Diagnosis not present

## 2020-10-16 DIAGNOSIS — I499 Cardiac arrhythmia, unspecified: Secondary | ICD-10-CM | POA: Diagnosis not present

## 2020-10-16 DIAGNOSIS — Z794 Long term (current) use of insulin: Secondary | ICD-10-CM | POA: Diagnosis not present

## 2020-10-16 DIAGNOSIS — E441 Mild protein-calorie malnutrition: Secondary | ICD-10-CM | POA: Diagnosis not present

## 2020-10-16 DIAGNOSIS — Z743 Need for continuous supervision: Secondary | ICD-10-CM | POA: Diagnosis not present

## 2020-10-16 DIAGNOSIS — Z79890 Hormone replacement therapy (postmenopausal): Secondary | ICD-10-CM | POA: Diagnosis not present

## 2020-10-16 DIAGNOSIS — R609 Edema, unspecified: Secondary | ICD-10-CM | POA: Diagnosis not present

## 2020-10-16 DIAGNOSIS — N183 Chronic kidney disease, stage 3 unspecified: Secondary | ICD-10-CM | POA: Diagnosis not present

## 2020-10-16 DIAGNOSIS — S21201D Unspecified open wound of right back wall of thorax without penetration into thoracic cavity, subsequent encounter: Secondary | ICD-10-CM | POA: Diagnosis not present

## 2020-10-16 DIAGNOSIS — R0989 Other specified symptoms and signs involving the circulatory and respiratory systems: Secondary | ICD-10-CM | POA: Diagnosis not present

## 2020-10-16 DIAGNOSIS — R0600 Dyspnea, unspecified: Secondary | ICD-10-CM | POA: Diagnosis not present

## 2020-10-16 DIAGNOSIS — Z96659 Presence of unspecified artificial knee joint: Secondary | ICD-10-CM | POA: Diagnosis not present

## 2020-10-16 DIAGNOSIS — Z79899 Other long term (current) drug therapy: Secondary | ICD-10-CM | POA: Diagnosis not present

## 2020-10-16 DIAGNOSIS — Z90710 Acquired absence of both cervix and uterus: Secondary | ICD-10-CM | POA: Diagnosis not present

## 2020-10-16 DIAGNOSIS — F419 Anxiety disorder, unspecified: Secondary | ICD-10-CM | POA: Diagnosis not present

## 2020-10-16 DIAGNOSIS — D6489 Other specified anemias: Secondary | ICD-10-CM | POA: Diagnosis not present

## 2020-10-16 DIAGNOSIS — I130 Hypertensive heart and chronic kidney disease with heart failure and stage 1 through stage 4 chronic kidney disease, or unspecified chronic kidney disease: Secondary | ICD-10-CM | POA: Diagnosis not present

## 2020-10-16 DIAGNOSIS — Z20822 Contact with and (suspected) exposure to covid-19: Secondary | ICD-10-CM | POA: Diagnosis not present

## 2020-10-16 DIAGNOSIS — I10 Essential (primary) hypertension: Secondary | ICD-10-CM | POA: Diagnosis not present

## 2020-10-16 DIAGNOSIS — E1142 Type 2 diabetes mellitus with diabetic polyneuropathy: Secondary | ICD-10-CM | POA: Diagnosis not present

## 2020-10-16 DIAGNOSIS — E871 Hypo-osmolality and hyponatremia: Secondary | ICD-10-CM | POA: Diagnosis not present

## 2020-10-16 DIAGNOSIS — M6281 Muscle weakness (generalized): Secondary | ICD-10-CM | POA: Diagnosis not present

## 2020-10-16 DIAGNOSIS — J9621 Acute and chronic respiratory failure with hypoxia: Secondary | ICD-10-CM | POA: Diagnosis not present

## 2020-10-16 DIAGNOSIS — L989 Disorder of the skin and subcutaneous tissue, unspecified: Secondary | ICD-10-CM | POA: Diagnosis not present

## 2020-10-16 DIAGNOSIS — I5033 Acute on chronic diastolic (congestive) heart failure: Secondary | ICD-10-CM | POA: Diagnosis not present

## 2020-10-16 DIAGNOSIS — R0602 Shortness of breath: Secondary | ICD-10-CM | POA: Diagnosis not present

## 2020-10-16 DIAGNOSIS — J449 Chronic obstructive pulmonary disease, unspecified: Secondary | ICD-10-CM | POA: Diagnosis not present

## 2020-10-16 DIAGNOSIS — Z96653 Presence of artificial knee joint, bilateral: Secondary | ICD-10-CM | POA: Diagnosis not present

## 2020-10-16 DIAGNOSIS — I700 Atherosclerosis of aorta: Secondary | ICD-10-CM | POA: Diagnosis not present

## 2020-10-16 DIAGNOSIS — I272 Pulmonary hypertension, unspecified: Secondary | ICD-10-CM | POA: Diagnosis not present

## 2020-10-16 DIAGNOSIS — E119 Type 2 diabetes mellitus without complications: Secondary | ICD-10-CM | POA: Diagnosis not present

## 2020-10-16 DIAGNOSIS — R269 Unspecified abnormalities of gait and mobility: Secondary | ICD-10-CM | POA: Diagnosis not present

## 2020-10-16 DIAGNOSIS — J45901 Unspecified asthma with (acute) exacerbation: Secondary | ICD-10-CM | POA: Diagnosis not present

## 2020-10-16 DIAGNOSIS — Z7951 Long term (current) use of inhaled steroids: Secondary | ICD-10-CM | POA: Diagnosis not present

## 2020-10-16 DIAGNOSIS — D649 Anemia, unspecified: Secondary | ICD-10-CM | POA: Diagnosis not present

## 2020-10-16 DIAGNOSIS — E1122 Type 2 diabetes mellitus with diabetic chronic kidney disease: Secondary | ICD-10-CM | POA: Diagnosis not present

## 2020-10-16 DIAGNOSIS — I509 Heart failure, unspecified: Secondary | ICD-10-CM | POA: Diagnosis not present

## 2020-10-16 DIAGNOSIS — R2681 Unsteadiness on feet: Secondary | ICD-10-CM | POA: Diagnosis not present

## 2020-10-16 DIAGNOSIS — E785 Hyperlipidemia, unspecified: Secondary | ICD-10-CM | POA: Diagnosis not present

## 2020-10-16 DIAGNOSIS — I5032 Chronic diastolic (congestive) heart failure: Secondary | ICD-10-CM | POA: Diagnosis not present

## 2020-10-16 DIAGNOSIS — M1990 Unspecified osteoarthritis, unspecified site: Secondary | ICD-10-CM | POA: Diagnosis not present

## 2020-10-16 DIAGNOSIS — R059 Cough, unspecified: Secondary | ICD-10-CM | POA: Diagnosis not present

## 2020-10-16 DIAGNOSIS — N184 Chronic kidney disease, stage 4 (severe): Secondary | ICD-10-CM | POA: Diagnosis not present

## 2020-10-16 DIAGNOSIS — R6889 Other general symptoms and signs: Secondary | ICD-10-CM | POA: Diagnosis not present

## 2020-10-16 DIAGNOSIS — Z6841 Body Mass Index (BMI) 40.0 and over, adult: Secondary | ICD-10-CM | POA: Diagnosis not present

## 2020-10-16 DIAGNOSIS — L720 Epidermal cyst: Secondary | ICD-10-CM | POA: Diagnosis not present

## 2020-10-16 DIAGNOSIS — E039 Hypothyroidism, unspecified: Secondary | ICD-10-CM | POA: Diagnosis not present

## 2020-10-16 DIAGNOSIS — E890 Postprocedural hypothyroidism: Secondary | ICD-10-CM | POA: Diagnosis not present

## 2020-10-16 DIAGNOSIS — K219 Gastro-esophageal reflux disease without esophagitis: Secondary | ICD-10-CM | POA: Diagnosis not present

## 2020-10-19 ENCOUNTER — Telehealth: Payer: Self-pay | Admitting: Interventional Cardiology

## 2020-10-19 DIAGNOSIS — I5032 Chronic diastolic (congestive) heart failure: Secondary | ICD-10-CM | POA: Diagnosis not present

## 2020-10-19 DIAGNOSIS — D649 Anemia, unspecified: Secondary | ICD-10-CM | POA: Diagnosis not present

## 2020-10-19 DIAGNOSIS — R269 Unspecified abnormalities of gait and mobility: Secondary | ICD-10-CM | POA: Diagnosis not present

## 2020-10-19 DIAGNOSIS — R2681 Unsteadiness on feet: Secondary | ICD-10-CM | POA: Diagnosis not present

## 2020-10-19 DIAGNOSIS — M1990 Unspecified osteoarthritis, unspecified site: Secondary | ICD-10-CM | POA: Diagnosis not present

## 2020-10-19 DIAGNOSIS — N183 Chronic kidney disease, stage 3 unspecified: Secondary | ICD-10-CM | POA: Diagnosis not present

## 2020-10-19 DIAGNOSIS — E119 Type 2 diabetes mellitus without complications: Secondary | ICD-10-CM | POA: Diagnosis not present

## 2020-10-19 DIAGNOSIS — E039 Hypothyroidism, unspecified: Secondary | ICD-10-CM | POA: Diagnosis not present

## 2020-10-19 DIAGNOSIS — E1142 Type 2 diabetes mellitus with diabetic polyneuropathy: Secondary | ICD-10-CM | POA: Diagnosis not present

## 2020-10-19 DIAGNOSIS — K219 Gastro-esophageal reflux disease without esophagitis: Secondary | ICD-10-CM | POA: Diagnosis not present

## 2020-10-19 DIAGNOSIS — Z96659 Presence of unspecified artificial knee joint: Secondary | ICD-10-CM | POA: Diagnosis not present

## 2020-10-19 DIAGNOSIS — M6281 Muscle weakness (generalized): Secondary | ICD-10-CM | POA: Diagnosis not present

## 2020-10-19 DIAGNOSIS — I509 Heart failure, unspecified: Secondary | ICD-10-CM | POA: Diagnosis not present

## 2020-10-19 NOTE — Telephone Encounter (Signed)
Patient was recently hospitalized and diuretics managed by renal.   I spoke with patient. She is currently at [facility].  Patient states weight today was 219 lbs.  Last weight prior to this was 215 lbs one week ago.  Patient reports some shortness of breath the last few days and dry cough for 2 weeks.  Had swelling in her feet earlier today but has improved this afternoon.  Swelling first noted last Thursday.  Patient is taking torsemide 40 mg daily and states nurse at [facility] told her she would be receiving additional 20 mg torsemide today.  Patient is seeing renal doctor on 5/26.  I told patient extra torsemide should help with weight gain and shortness of breath.  I asked her to contact renal if no improvement. Patient is scheduled to see Dr Irish Lack on 6/8 and she is aware of this appointment.

## 2020-10-19 NOTE — Telephone Encounter (Signed)
New Message:    Pt said if that phone number does not go through, please call [facility] and ask for her.  Pt c/o swelling: STAT is pt has developed SOB within 24 hours  1) How much weight have you gained and in what time span?yes- 41bs in a week  2) If swelling, where is the swelling located? Feet swelling  3) Are you currently taking a fluid pill? dhe think she is- She is in [facility] and her medicine have been changed  4) Are you currently SOB? Yes   5) Do you have a log of your daily weights (if so, list)? 6) Have you gained 3 pounds in a day or 5 pounds in a week? 4 lbs in a week  7) Have you traveled recently? No- pt said the last time he was like this, she ended up in the hospital- she says she really need help

## 2020-10-20 DIAGNOSIS — R0989 Other specified symptoms and signs involving the circulatory and respiratory systems: Secondary | ICD-10-CM | POA: Diagnosis not present

## 2020-10-20 DIAGNOSIS — R059 Cough, unspecified: Secondary | ICD-10-CM | POA: Diagnosis not present

## 2020-10-20 DIAGNOSIS — S21201D Unspecified open wound of right back wall of thorax without penetration into thoracic cavity, subsequent encounter: Secondary | ICD-10-CM | POA: Diagnosis not present

## 2020-10-20 DIAGNOSIS — L720 Epidermal cyst: Secondary | ICD-10-CM | POA: Diagnosis not present

## 2020-10-20 DIAGNOSIS — I5032 Chronic diastolic (congestive) heart failure: Secondary | ICD-10-CM | POA: Diagnosis not present

## 2020-10-20 DIAGNOSIS — N184 Chronic kidney disease, stage 4 (severe): Secondary | ICD-10-CM | POA: Diagnosis not present

## 2020-10-21 DIAGNOSIS — N184 Chronic kidney disease, stage 4 (severe): Secondary | ICD-10-CM | POA: Diagnosis not present

## 2020-10-21 DIAGNOSIS — I5032 Chronic diastolic (congestive) heart failure: Secondary | ICD-10-CM | POA: Diagnosis not present

## 2020-10-21 NOTE — Telephone Encounter (Signed)
   Pt is calling back, she said they she had an x-ray yesterday and she have pneumonia, she said her swelling it still bad. She wanted to speak with RN Fraser Din again.

## 2020-10-21 NOTE — Telephone Encounter (Signed)
PT NEEDS TO SPEAK W/ THE NURSE, PT DIAGNOSED W/ PNEUMONIA AND WOULD LIKE TO KNOW IF DR. VARANASI HAS BEEN NOTIFIED

## 2020-10-21 NOTE — Telephone Encounter (Signed)
I spoke with patient.  She reports she had chest X-ray done last evening at [facility] which showed pneumonia.  She is being treated with antibiotics and oxygen.  States she is also receiving lasix for the swelling.  Reports she was to be at [facility] for 5 days and is now being told she needs to stay for 30 days.  She is asking if Dr Irish Lack could help her leave [facility].  I told patient she would need to follow up with physician and staff at [facility] for this.

## 2020-10-22 DIAGNOSIS — M6281 Muscle weakness (generalized): Secondary | ICD-10-CM | POA: Diagnosis not present

## 2020-10-22 DIAGNOSIS — I5032 Chronic diastolic (congestive) heart failure: Secondary | ICD-10-CM | POA: Diagnosis not present

## 2020-10-22 DIAGNOSIS — R2681 Unsteadiness on feet: Secondary | ICD-10-CM | POA: Diagnosis not present

## 2020-10-22 DIAGNOSIS — I509 Heart failure, unspecified: Secondary | ICD-10-CM | POA: Diagnosis not present

## 2020-10-22 DIAGNOSIS — M1990 Unspecified osteoarthritis, unspecified site: Secondary | ICD-10-CM | POA: Diagnosis not present

## 2020-10-22 DIAGNOSIS — E039 Hypothyroidism, unspecified: Secondary | ICD-10-CM | POA: Diagnosis not present

## 2020-10-22 DIAGNOSIS — K219 Gastro-esophageal reflux disease without esophagitis: Secondary | ICD-10-CM | POA: Diagnosis not present

## 2020-10-22 DIAGNOSIS — L989 Disorder of the skin and subcutaneous tissue, unspecified: Secondary | ICD-10-CM | POA: Diagnosis not present

## 2020-10-22 DIAGNOSIS — E1142 Type 2 diabetes mellitus with diabetic polyneuropathy: Secondary | ICD-10-CM | POA: Diagnosis not present

## 2020-10-22 DIAGNOSIS — D649 Anemia, unspecified: Secondary | ICD-10-CM | POA: Diagnosis not present

## 2020-10-22 DIAGNOSIS — N183 Chronic kidney disease, stage 3 unspecified: Secondary | ICD-10-CM | POA: Diagnosis not present

## 2020-10-22 DIAGNOSIS — N184 Chronic kidney disease, stage 4 (severe): Secondary | ICD-10-CM | POA: Diagnosis not present

## 2020-10-23 ENCOUNTER — Emergency Department (HOSPITAL_BASED_OUTPATIENT_CLINIC_OR_DEPARTMENT_OTHER): Payer: Medicare Other

## 2020-10-23 ENCOUNTER — Emergency Department (HOSPITAL_COMMUNITY): Payer: Medicare Other

## 2020-10-23 ENCOUNTER — Other Ambulatory Visit: Payer: Self-pay

## 2020-10-23 ENCOUNTER — Inpatient Hospital Stay (HOSPITAL_COMMUNITY)
Admission: EM | Admit: 2020-10-23 | Discharge: 2020-10-27 | DRG: 202 | Disposition: A | Discharge status: Skilled Nursing Facility | Payer: Medicare Other | Source: Skilled Nursing Facility | Attending: Internal Medicine | Admitting: Internal Medicine

## 2020-10-23 DIAGNOSIS — J9621 Acute and chronic respiratory failure with hypoxia: Secondary | ICD-10-CM | POA: Diagnosis not present

## 2020-10-23 DIAGNOSIS — Z20822 Contact with and (suspected) exposure to covid-19: Secondary | ICD-10-CM | POA: Diagnosis not present

## 2020-10-23 DIAGNOSIS — E785 Hyperlipidemia, unspecified: Secondary | ICD-10-CM | POA: Diagnosis present

## 2020-10-23 DIAGNOSIS — Z7951 Long term (current) use of inhaled steroids: Secondary | ICD-10-CM | POA: Diagnosis not present

## 2020-10-23 DIAGNOSIS — F419 Anxiety disorder, unspecified: Secondary | ICD-10-CM | POA: Diagnosis present

## 2020-10-23 DIAGNOSIS — T501X5A Adverse effect of loop [high-ceiling] diuretics, initial encounter: Secondary | ICD-10-CM | POA: Diagnosis present

## 2020-10-23 DIAGNOSIS — R739 Hyperglycemia, unspecified: Secondary | ICD-10-CM

## 2020-10-23 DIAGNOSIS — D6489 Other specified anemias: Secondary | ICD-10-CM | POA: Diagnosis not present

## 2020-10-23 DIAGNOSIS — R609 Edema, unspecified: Secondary | ICD-10-CM

## 2020-10-23 DIAGNOSIS — Z79899 Other long term (current) drug therapy: Secondary | ICD-10-CM

## 2020-10-23 DIAGNOSIS — D649 Anemia, unspecified: Secondary | ICD-10-CM | POA: Diagnosis present

## 2020-10-23 DIAGNOSIS — E890 Postprocedural hypothyroidism: Secondary | ICD-10-CM | POA: Diagnosis present

## 2020-10-23 DIAGNOSIS — K219 Gastro-esophageal reflux disease without esophagitis: Secondary | ICD-10-CM | POA: Diagnosis present

## 2020-10-23 DIAGNOSIS — I700 Atherosclerosis of aorta: Secondary | ICD-10-CM | POA: Diagnosis present

## 2020-10-23 DIAGNOSIS — Z96653 Presence of artificial knee joint, bilateral: Secondary | ICD-10-CM | POA: Diagnosis present

## 2020-10-23 DIAGNOSIS — R0602 Shortness of breath: Secondary | ICD-10-CM | POA: Diagnosis not present

## 2020-10-23 DIAGNOSIS — E441 Mild protein-calorie malnutrition: Secondary | ICD-10-CM | POA: Diagnosis present

## 2020-10-23 DIAGNOSIS — R6889 Other general symptoms and signs: Secondary | ICD-10-CM | POA: Diagnosis not present

## 2020-10-23 DIAGNOSIS — I5032 Chronic diastolic (congestive) heart failure: Secondary | ICD-10-CM | POA: Diagnosis present

## 2020-10-23 DIAGNOSIS — F32A Depression, unspecified: Secondary | ICD-10-CM | POA: Diagnosis not present

## 2020-10-23 DIAGNOSIS — J449 Chronic obstructive pulmonary disease, unspecified: Secondary | ICD-10-CM | POA: Diagnosis not present

## 2020-10-23 DIAGNOSIS — N184 Chronic kidney disease, stage 4 (severe): Secondary | ICD-10-CM | POA: Diagnosis not present

## 2020-10-23 DIAGNOSIS — E7800 Pure hypercholesterolemia, unspecified: Secondary | ICD-10-CM | POA: Diagnosis present

## 2020-10-23 DIAGNOSIS — R0600 Dyspnea, unspecified: Secondary | ICD-10-CM | POA: Diagnosis not present

## 2020-10-23 DIAGNOSIS — J45901 Unspecified asthma with (acute) exacerbation: Principal | ICD-10-CM | POA: Diagnosis present

## 2020-10-23 DIAGNOSIS — E669 Obesity, unspecified: Secondary | ICD-10-CM | POA: Diagnosis present

## 2020-10-23 DIAGNOSIS — E039 Hypothyroidism, unspecified: Secondary | ICD-10-CM | POA: Diagnosis present

## 2020-10-23 DIAGNOSIS — I130 Hypertensive heart and chronic kidney disease with heart failure and stage 1 through stage 4 chronic kidney disease, or unspecified chronic kidney disease: Secondary | ICD-10-CM | POA: Diagnosis not present

## 2020-10-23 DIAGNOSIS — I499 Cardiac arrhythmia, unspecified: Secondary | ICD-10-CM | POA: Diagnosis not present

## 2020-10-23 DIAGNOSIS — Z90710 Acquired absence of both cervix and uterus: Secondary | ICD-10-CM

## 2020-10-23 DIAGNOSIS — Z794 Long term (current) use of insulin: Secondary | ICD-10-CM | POA: Diagnosis not present

## 2020-10-23 DIAGNOSIS — E871 Hypo-osmolality and hyponatremia: Secondary | ICD-10-CM | POA: Diagnosis not present

## 2020-10-23 DIAGNOSIS — R5381 Other malaise: Secondary | ICD-10-CM | POA: Diagnosis present

## 2020-10-23 DIAGNOSIS — Z79890 Hormone replacement therapy (postmenopausal): Secondary | ICD-10-CM

## 2020-10-23 DIAGNOSIS — F633 Trichotillomania: Secondary | ICD-10-CM | POA: Diagnosis present

## 2020-10-23 DIAGNOSIS — J189 Pneumonia, unspecified organism: Secondary | ICD-10-CM | POA: Diagnosis present

## 2020-10-23 DIAGNOSIS — E1122 Type 2 diabetes mellitus with diabetic chronic kidney disease: Secondary | ICD-10-CM | POA: Diagnosis not present

## 2020-10-23 DIAGNOSIS — I10 Essential (primary) hypertension: Secondary | ICD-10-CM | POA: Diagnosis present

## 2020-10-23 DIAGNOSIS — Z6841 Body Mass Index (BMI) 40.0 and over, adult: Secondary | ICD-10-CM

## 2020-10-23 DIAGNOSIS — I272 Pulmonary hypertension, unspecified: Secondary | ICD-10-CM | POA: Diagnosis present

## 2020-10-23 DIAGNOSIS — E66813 Obesity, class 3: Secondary | ICD-10-CM | POA: Diagnosis present

## 2020-10-23 DIAGNOSIS — Z743 Need for continuous supervision: Secondary | ICD-10-CM | POA: Diagnosis not present

## 2020-10-23 LAB — CBC WITH DIFFERENTIAL/PLATELET
Abs Immature Granulocytes: 0.02 10*3/uL (ref 0.00–0.07)
Basophils Absolute: 0 10*3/uL (ref 0.0–0.1)
Basophils Relative: 1 %
Eosinophils Absolute: 0.3 10*3/uL (ref 0.0–0.5)
Eosinophils Relative: 5 %
HCT: 29.1 % — ABNORMAL LOW (ref 36.0–46.0)
Hemoglobin: 9 g/dL — ABNORMAL LOW (ref 12.0–15.0)
Immature Granulocytes: 0 %
Lymphocytes Relative: 9 %
Lymphs Abs: 0.7 10*3/uL (ref 0.7–4.0)
MCH: 27.7 pg (ref 26.0–34.0)
MCHC: 30.9 g/dL (ref 30.0–36.0)
MCV: 89.5 fL (ref 80.0–100.0)
Monocytes Absolute: 0.6 10*3/uL (ref 0.1–1.0)
Monocytes Relative: 9 %
Neutro Abs: 5.3 10*3/uL (ref 1.7–7.7)
Neutrophils Relative %: 76 %
Platelets: 281 10*3/uL (ref 150–400)
RBC: 3.25 MIL/uL — ABNORMAL LOW (ref 3.87–5.11)
RDW: 15.9 % — ABNORMAL HIGH (ref 11.5–15.5)
WBC: 7 10*3/uL (ref 4.0–10.5)
nRBC: 0 % (ref 0.0–0.2)

## 2020-10-23 LAB — HEPATIC FUNCTION PANEL
ALT: 15 U/L (ref 0–44)
AST: 19 U/L (ref 15–41)
Albumin: 3.2 g/dL — ABNORMAL LOW (ref 3.5–5.0)
Alkaline Phosphatase: 58 U/L (ref 38–126)
Bilirubin, Direct: 0.1 mg/dL (ref 0.0–0.2)
Indirect Bilirubin: 0.9 mg/dL (ref 0.3–0.9)
Total Bilirubin: 1 mg/dL (ref 0.3–1.2)
Total Protein: 5.8 g/dL — ABNORMAL LOW (ref 6.5–8.1)

## 2020-10-23 LAB — BASIC METABOLIC PANEL
Anion gap: 10 (ref 5–15)
BUN: 46 mg/dL — ABNORMAL HIGH (ref 8–23)
CO2: 27 mmol/L (ref 22–32)
Calcium: 8.6 mg/dL — ABNORMAL LOW (ref 8.9–10.3)
Chloride: 94 mmol/L — ABNORMAL LOW (ref 98–111)
Creatinine, Ser: 3.34 mg/dL — ABNORMAL HIGH (ref 0.44–1.00)
GFR, Estimated: 14 mL/min — ABNORMAL LOW (ref >60)
Glucose, Bld: 92 mg/dL (ref 70–99)
Potassium: 3.9 mmol/L (ref 3.5–5.1)
Sodium: 131 mmol/L — ABNORMAL LOW (ref 135–145)

## 2020-10-23 LAB — TROPONIN I (HIGH SENSITIVITY)
Troponin I (High Sensitivity): 13 ng/L (ref <18)
Troponin I (High Sensitivity): 18 ng/L — ABNORMAL HIGH (ref <18)

## 2020-10-23 LAB — BRAIN NATRIURETIC PEPTIDE: B Natriuretic Peptide: 99.6 pg/mL (ref 0.0–100.0)

## 2020-10-23 MED ORDER — POLYVINYL ALCOHOL 1.4 % OP SOLN
1.0000 [drp] | Freq: Two times a day (BID) | OPHTHALMIC | Status: DC
  Administered 2020-10-24 – 2020-10-27 (×6): 1 [drp] via OPHTHALMIC
  Filled 2020-10-23 (×2): qty 15

## 2020-10-23 MED ORDER — LEVOTHYROXINE SODIUM 100 MCG PO TABS
200.0000 ug | ORAL_TABLET | Freq: Every day | ORAL | Status: DC
  Administered 2020-10-25 – 2020-10-27 (×3): 200 ug via ORAL
  Filled 2020-10-23 (×3): qty 2

## 2020-10-23 MED ORDER — ENOXAPARIN SODIUM 40 MG/0.4ML IJ SOSY
40.0000 mg | PREFILLED_SYRINGE | INTRAMUSCULAR | Status: DC
  Administered 2020-10-24: 40 mg via SUBCUTANEOUS
  Filled 2020-10-23: qty 0.4

## 2020-10-23 MED ORDER — MONTELUKAST SODIUM 10 MG PO TABS
10.0000 mg | ORAL_TABLET | Freq: Every day | ORAL | Status: DC
  Administered 2020-10-24 – 2020-10-27 (×5): 10 mg via ORAL
  Filled 2020-10-23 (×5): qty 1

## 2020-10-23 MED ORDER — DILTIAZEM HCL ER COATED BEADS 180 MG PO CP24
360.0000 mg | ORAL_CAPSULE | Freq: Every day | ORAL | Status: DC
  Administered 2020-10-24 – 2020-10-27 (×4): 360 mg via ORAL
  Filled 2020-10-23 (×4): qty 2
  Filled 2020-10-23: qty 1

## 2020-10-23 MED ORDER — HYDRALAZINE HCL 25 MG PO TABS
25.0000 mg | ORAL_TABLET | Freq: Three times a day (TID) | ORAL | Status: DC
  Administered 2020-10-24 – 2020-10-27 (×12): 25 mg via ORAL
  Filled 2020-10-23 (×13): qty 1

## 2020-10-23 MED ORDER — ALBUTEROL SULFATE (2.5 MG/3ML) 0.083% IN NEBU: 3.0000 mL | INHALATION_SOLUTION | RESPIRATORY_TRACT | Status: DC | PRN

## 2020-10-23 MED ORDER — FUROSEMIDE 10 MG/ML IJ SOLN
40.0000 mg | Freq: Once | INTRAMUSCULAR | Status: AC
  Administered 2020-10-23: 40 mg via INTRAVENOUS
  Filled 2020-10-23: qty 4

## 2020-10-23 MED ORDER — ONDANSETRON HCL 4 MG/2ML IJ SOLN: 4.0000 mg | Freq: Four times a day (QID) | INTRAMUSCULAR | Status: DC | PRN

## 2020-10-23 MED ORDER — EZETIMIBE 10 MG PO TABS
10.0000 mg | ORAL_TABLET | Freq: Every day | ORAL | Status: DC
  Administered 2020-10-24 – 2020-10-27 (×4): 10 mg via ORAL
  Filled 2020-10-23 (×4): qty 1

## 2020-10-23 MED ORDER — SODIUM CHLORIDE 0.9 % IV SOLN
1.0000 g | INTRAVENOUS | Status: DC
  Administered 2020-10-24: 1 g via INTRAVENOUS
  Filled 2020-10-23: qty 10

## 2020-10-23 MED ORDER — LORAZEPAM 0.5 MG PO TABS
0.2500 mg | ORAL_TABLET | Freq: Two times a day (BID) | ORAL | Status: DC
  Administered 2020-10-24 – 2020-10-26 (×5): 0.25 mg via ORAL
  Filled 2020-10-23 (×6): qty 1

## 2020-10-23 MED ORDER — ESCITALOPRAM OXALATE 10 MG PO TABS
20.0000 mg | ORAL_TABLET | Freq: Every day | ORAL | Status: DC
  Administered 2020-10-24 – 2020-10-27 (×5): 20 mg via ORAL
  Filled 2020-10-23 (×5): qty 2

## 2020-10-23 MED ORDER — BENZONATATE 100 MG PO CAPS: 100.0000 mg | ORAL_CAPSULE | Freq: Three times a day (TID) | ORAL | Status: DC | PRN

## 2020-10-23 MED ORDER — FERROUS GLUCONATE 324 (38 FE) MG PO TABS
324.0000 mg | ORAL_TABLET | ORAL | Status: DC
  Administered 2020-10-26: 324 mg via ORAL
  Filled 2020-10-23 (×2): qty 1

## 2020-10-23 MED ORDER — FLUTICASONE FUROATE-VILANTEROL 200-25 MCG/INH IN AEPB
1.0000 | INHALATION_SPRAY | Freq: Every morning | RESPIRATORY_TRACT | Status: DC
  Administered 2020-10-25 – 2020-10-27 (×3): 1 via RESPIRATORY_TRACT
  Filled 2020-10-23: qty 28

## 2020-10-23 MED ORDER — IPRATROPIUM-ALBUTEROL 0.5-2.5 (3) MG/3ML IN SOLN
3.0000 mL | Freq: Three times a day (TID) | RESPIRATORY_TRACT | Status: DC
  Administered 2020-10-24 (×3): 3 mL via RESPIRATORY_TRACT
  Filled 2020-10-23 (×3): qty 3

## 2020-10-23 MED ORDER — METOPROLOL SUCCINATE ER 50 MG PO TB24
50.0000 mg | ORAL_TABLET | Freq: Every morning | ORAL | Status: DC
  Administered 2020-10-24 – 2020-10-27 (×4): 50 mg via ORAL
  Filled 2020-10-23 (×3): qty 1

## 2020-10-23 MED ORDER — GABAPENTIN 100 MG PO CAPS
100.0000 mg | ORAL_CAPSULE | Freq: Two times a day (BID) | ORAL | Status: DC
  Administered 2020-10-24 – 2020-10-27 (×9): 100 mg via ORAL
  Filled 2020-10-23 (×8): qty 1

## 2020-10-23 MED ORDER — ATORVASTATIN CALCIUM 80 MG PO TABS
80.0000 mg | ORAL_TABLET | Freq: Every day | ORAL | Status: DC
  Administered 2020-10-24 – 2020-10-27 (×5): 80 mg via ORAL
  Filled 2020-10-23: qty 2
  Filled 2020-10-23 (×4): qty 1

## 2020-10-23 MED ORDER — DICLOFENAC SODIUM 1 % EX GEL
2.0000 g | Freq: Every morning | CUTANEOUS | Status: DC
  Administered 2020-10-24 – 2020-10-26 (×3): 2 g via TOPICAL
  Administered 2020-10-27: 4 g via TOPICAL
  Filled 2020-10-23: qty 100

## 2020-10-23 MED ORDER — SODIUM CHLORIDE 0.9 % IV SOLN: 2.0000 g | INTRAVENOUS | Status: DC

## 2020-10-23 MED ORDER — AZELASTINE HCL 0.1 % NA SOLN
1.0000 | Freq: Two times a day (BID) | NASAL | Status: DC
  Administered 2020-10-24 – 2020-10-27 (×6): 1 via NASAL
  Filled 2020-10-23: qty 30

## 2020-10-23 MED ORDER — DOXYCYCLINE HYCLATE 100 MG PO TABS
100.0000 mg | ORAL_TABLET | Freq: Two times a day (BID) | ORAL | Status: DC
  Administered 2020-10-24 – 2020-10-26 (×6): 100 mg via ORAL
  Filled 2020-10-23 (×6): qty 1

## 2020-10-23 MED ORDER — INSULIN DEGLUDEC 100 UNIT/ML ~~LOC~~ SOPN: 10.0000 [IU] | PEN_INJECTOR | Freq: Every day | SUBCUTANEOUS | Status: DC

## 2020-10-23 MED ORDER — PANTOPRAZOLE SODIUM 40 MG PO TBEC
40.0000 mg | DELAYED_RELEASE_TABLET | Freq: Every day | ORAL | Status: DC
  Administered 2020-10-24 – 2020-10-27 (×4): 40 mg via ORAL
  Filled 2020-10-23 (×4): qty 1

## 2020-10-23 MED ORDER — ONDANSETRON HCL 4 MG PO TABS: 4.0000 mg | ORAL_TABLET | Freq: Four times a day (QID) | ORAL | Status: DC | PRN

## 2020-10-23 MED ORDER — PALIPERIDONE ER 3 MG PO TB24
3.0000 mg | ORAL_TABLET | Freq: Every day | ORAL | Status: DC
  Administered 2020-10-24 – 2020-10-27 (×4): 3 mg via ORAL
  Filled 2020-10-23 (×6): qty 1

## 2020-10-23 NOTE — ED Notes (Signed)
Patient transported to CT 
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
## 2020-10-23 NOTE — Progress Notes (Signed)
Right lower extremity venous duplex completed. Refer to "CV Proc" under chart review to view preliminary results.  10/23/2020 7:46 PM Kelby Aline., MHA, RVT, RDCS, RDMS

## 2020-10-23 NOTE — ED Notes (Signed)
Help get patient into a gown on the monitor did ekg shown to er provider patient is resting with call bell in reach  

## 2020-10-23 NOTE — ED Triage Notes (Signed)
Pt BIB [facility] from [place] with a chief complaint of shortness of breath from a diagnosis of PNA as well as bilateral leg swelling that started last Thursday.  Pt is being treated with abx at [facility], does have a new O2 requirement requiring 2 L of O2 per [place]. Pt does endorse a productive cough. Pt does feel like breathing is overall better with the O2. Does not appear to be in any distress at this time. EMS reports bilateral diminished lung sounds. Afebrile. Denies chest pain, n/v/d. Pt admitted to [facility] after being hospitalized for 8 days and felt weak upon discharge so she was sent to [facility] for rehab. All other VSS at this time. NAD.

## 2020-10-23 NOTE — ED Provider Notes (Signed)
Medical Decision Making: Care of patient assumed from Dr. Marla Roe at 1630.  Agree with history, physical exam and plan.  See their note for further details.  Briefly, The pt p/w SOB and leg swelling, concern for possible volume overload or other pathology.   Current plan is as follows: labs, diuresis and imaging  CT chest shows some chronic changes, lab studies show fairly unremarkable findings from baseline.  Patient still has tachypnea.  We have not ruled out pulmonary embolism as a source for new shortness of breath.  VQ scan was ordered but will be unable to be done tonight.  Patient was given diuresis and is making urine.  Will consult medicine for admission and further diuresis    I personally reviewed and interpreted all labs/imaging.      Breck Coons, MD 10/23/20 2139

## 2020-10-24 ENCOUNTER — Encounter (HOSPITAL_COMMUNITY): Payer: Self-pay | Admitting: Internal Medicine

## 2020-10-24 DIAGNOSIS — F32A Depression, unspecified: Secondary | ICD-10-CM | POA: Diagnosis present

## 2020-10-24 DIAGNOSIS — Z7951 Long term (current) use of inhaled steroids: Secondary | ICD-10-CM | POA: Diagnosis not present

## 2020-10-24 DIAGNOSIS — E441 Mild protein-calorie malnutrition: Secondary | ICD-10-CM | POA: Diagnosis present

## 2020-10-24 DIAGNOSIS — J9621 Acute and chronic respiratory failure with hypoxia: Secondary | ICD-10-CM | POA: Diagnosis not present

## 2020-10-24 DIAGNOSIS — Z79899 Other long term (current) drug therapy: Secondary | ICD-10-CM | POA: Diagnosis not present

## 2020-10-24 DIAGNOSIS — E1122 Type 2 diabetes mellitus with diabetic chronic kidney disease: Secondary | ICD-10-CM | POA: Diagnosis present

## 2020-10-24 DIAGNOSIS — I272 Pulmonary hypertension, unspecified: Secondary | ICD-10-CM | POA: Diagnosis present

## 2020-10-24 DIAGNOSIS — Z79890 Hormone replacement therapy (postmenopausal): Secondary | ICD-10-CM | POA: Diagnosis not present

## 2020-10-24 DIAGNOSIS — R0602 Shortness of breath: Secondary | ICD-10-CM | POA: Diagnosis not present

## 2020-10-24 DIAGNOSIS — I10 Essential (primary) hypertension: Secondary | ICD-10-CM | POA: Diagnosis not present

## 2020-10-24 DIAGNOSIS — Z96653 Presence of artificial knee joint, bilateral: Secondary | ICD-10-CM | POA: Diagnosis present

## 2020-10-24 DIAGNOSIS — E669 Obesity, unspecified: Secondary | ICD-10-CM | POA: Diagnosis present

## 2020-10-24 DIAGNOSIS — J45901 Unspecified asthma with (acute) exacerbation: Secondary | ICD-10-CM | POA: Diagnosis not present

## 2020-10-24 DIAGNOSIS — N184 Chronic kidney disease, stage 4 (severe): Secondary | ICD-10-CM | POA: Diagnosis present

## 2020-10-24 DIAGNOSIS — I700 Atherosclerosis of aorta: Secondary | ICD-10-CM

## 2020-10-24 DIAGNOSIS — I130 Hypertensive heart and chronic kidney disease with heart failure and stage 1 through stage 4 chronic kidney disease, or unspecified chronic kidney disease: Secondary | ICD-10-CM | POA: Diagnosis present

## 2020-10-24 DIAGNOSIS — K219 Gastro-esophageal reflux disease without esophagitis: Secondary | ICD-10-CM | POA: Diagnosis not present

## 2020-10-24 DIAGNOSIS — Z20822 Contact with and (suspected) exposure to covid-19: Secondary | ICD-10-CM | POA: Diagnosis present

## 2020-10-24 DIAGNOSIS — R0600 Dyspnea, unspecified: Secondary | ICD-10-CM

## 2020-10-24 DIAGNOSIS — I5032 Chronic diastolic (congestive) heart failure: Secondary | ICD-10-CM | POA: Diagnosis present

## 2020-10-24 DIAGNOSIS — Z90710 Acquired absence of both cervix and uterus: Secondary | ICD-10-CM | POA: Diagnosis not present

## 2020-10-24 DIAGNOSIS — E785 Hyperlipidemia, unspecified: Secondary | ICD-10-CM

## 2020-10-24 DIAGNOSIS — E871 Hypo-osmolality and hyponatremia: Secondary | ICD-10-CM | POA: Diagnosis present

## 2020-10-24 DIAGNOSIS — J449 Chronic obstructive pulmonary disease, unspecified: Secondary | ICD-10-CM | POA: Diagnosis present

## 2020-10-24 DIAGNOSIS — E890 Postprocedural hypothyroidism: Secondary | ICD-10-CM | POA: Diagnosis present

## 2020-10-24 DIAGNOSIS — D6489 Other specified anemias: Secondary | ICD-10-CM | POA: Diagnosis present

## 2020-10-24 DIAGNOSIS — F419 Anxiety disorder, unspecified: Secondary | ICD-10-CM | POA: Diagnosis not present

## 2020-10-24 DIAGNOSIS — Z743 Need for continuous supervision: Secondary | ICD-10-CM | POA: Diagnosis not present

## 2020-10-24 DIAGNOSIS — Z794 Long term (current) use of insulin: Secondary | ICD-10-CM | POA: Diagnosis not present

## 2020-10-24 DIAGNOSIS — Z6841 Body Mass Index (BMI) 40.0 and over, adult: Secondary | ICD-10-CM | POA: Diagnosis not present

## 2020-10-24 HISTORY — DX: Atherosclerosis of aorta: I70.0

## 2020-10-24 LAB — COMPREHENSIVE METABOLIC PANEL
ALT: 17 U/L (ref 0–44)
AST: 17 U/L (ref 15–41)
Albumin: 3.3 g/dL — ABNORMAL LOW (ref 3.5–5.0)
Alkaline Phosphatase: 65 U/L (ref 38–126)
Anion gap: 12 (ref 5–15)
BUN: 46 mg/dL — ABNORMAL HIGH (ref 8–23)
CO2: 28 mmol/L (ref 22–32)
Calcium: 9.2 mg/dL (ref 8.9–10.3)
Chloride: 97 mmol/L — ABNORMAL LOW (ref 98–111)
Creatinine, Ser: 3.43 mg/dL — ABNORMAL HIGH (ref 0.44–1.00)
GFR, Estimated: 13 mL/min — ABNORMAL LOW (ref >60)
Glucose, Bld: 108 mg/dL — ABNORMAL HIGH (ref 70–99)
Potassium: 3.5 mmol/L (ref 3.5–5.1)
Sodium: 137 mmol/L (ref 135–145)
Total Bilirubin: 0.4 mg/dL (ref 0.3–1.2)
Total Protein: 6.2 g/dL — ABNORMAL LOW (ref 6.5–8.1)

## 2020-10-24 LAB — CBC
HCT: 31.1 % — ABNORMAL LOW (ref 36.0–46.0)
Hemoglobin: 9.7 g/dL — ABNORMAL LOW (ref 12.0–15.0)
MCH: 27.8 pg (ref 26.0–34.0)
MCHC: 31.2 g/dL (ref 30.0–36.0)
MCV: 89.1 fL (ref 80.0–100.0)
Platelets: 270 10*3/uL (ref 150–400)
RBC: 3.49 MIL/uL — ABNORMAL LOW (ref 3.87–5.11)
RDW: 15.9 % — ABNORMAL HIGH (ref 11.5–15.5)
WBC: 7.2 10*3/uL (ref 4.0–10.5)
nRBC: 0 % (ref 0.0–0.2)

## 2020-10-24 LAB — GLUCOSE, CAPILLARY
Glucose-Capillary: 251 mg/dL — ABNORMAL HIGH (ref 70–99)
Glucose-Capillary: 259 mg/dL — ABNORMAL HIGH (ref 70–99)

## 2020-10-24 LAB — SARS CORONAVIRUS 2 (TAT 6-24 HRS): SARS Coronavirus 2: NEGATIVE

## 2020-10-24 LAB — PROCALCITONIN: Procalcitonin: 0.18 ng/mL

## 2020-10-24 MED ORDER — METHYLPREDNISOLONE SODIUM SUCC 40 MG IJ SOLR
40.0000 mg | Freq: Four times a day (QID) | INTRAMUSCULAR | Status: DC
  Administered 2020-10-24 – 2020-10-25 (×4): 40 mg via INTRAVENOUS
  Filled 2020-10-24 (×4): qty 1

## 2020-10-24 MED ORDER — ENOXAPARIN SODIUM 30 MG/0.3ML IJ SOSY
30.0000 mg | PREFILLED_SYRINGE | INTRAMUSCULAR | Status: DC
  Administered 2020-10-25 – 2020-10-27 (×3): 30 mg via SUBCUTANEOUS
  Filled 2020-10-24 (×3): qty 0.3

## 2020-10-24 MED ORDER — IPRATROPIUM-ALBUTEROL 0.5-2.5 (3) MG/3ML IN SOLN
3.0000 mL | Freq: Two times a day (BID) | RESPIRATORY_TRACT | Status: DC
  Administered 2020-10-25 – 2020-10-26 (×4): 3 mL via RESPIRATORY_TRACT
  Filled 2020-10-24 (×4): qty 3

## 2020-10-24 MED ORDER — LORAZEPAM 0.5 MG PO TABS
0.5000 mg | ORAL_TABLET | Freq: Every day | ORAL | Status: DC
  Administered 2020-10-24 – 2020-10-27 (×5): 0.5 mg via ORAL
  Filled 2020-10-24 (×5): qty 1

## 2020-10-24 MED ORDER — TORSEMIDE 20 MG PO TABS
40.0000 mg | ORAL_TABLET | Freq: Every morning | ORAL | Status: DC
  Administered 2020-10-24 – 2020-10-27 (×4): 40 mg via ORAL
  Filled 2020-10-24 (×4): qty 2

## 2020-10-24 MED ORDER — GLUCERNA SHAKE PO LIQD
237.0000 mL | Freq: Three times a day (TID) | ORAL | Status: DC
  Administered 2020-10-24 – 2020-10-27 (×8): 237 mL via ORAL

## 2020-10-24 MED ORDER — SODIUM CHLORIDE 0.9 % IV SOLN
1.0000 g | INTRAVENOUS | Status: DC
  Administered 2020-10-24 – 2020-10-25 (×2): 1 g via INTRAVENOUS
  Filled 2020-10-24 (×2): qty 10

## 2020-10-24 MED ORDER — METHYLPREDNISOLONE SODIUM SUCC 125 MG IJ SOLR
125.0000 mg | Freq: Once | INTRAMUSCULAR | Status: AC
  Administered 2020-10-24: 125 mg via INTRAVENOUS
  Filled 2020-10-24: qty 2

## 2020-10-24 MED ORDER — SODIUM CHLORIDE 0.9 % IV SOLN: 2.0000 g | INTRAVENOUS | Status: DC

## 2020-10-24 MED ORDER — INSULIN ASPART 100 UNIT/ML IJ SOLN
0.0000 [IU] | Freq: Three times a day (TID) | INTRAMUSCULAR | Status: DC
  Administered 2020-10-24: 3 [IU] via SUBCUTANEOUS
  Administered 2020-10-25 (×2): 1 [IU] via SUBCUTANEOUS
  Administered 2020-10-25: 2 [IU] via SUBCUTANEOUS
  Administered 2020-10-26: 1 [IU] via SUBCUTANEOUS
  Administered 2020-10-26: 3 [IU] via SUBCUTANEOUS
  Administered 2020-10-26: 1 [IU] via SUBCUTANEOUS
  Administered 2020-10-27 (×2): 3 [IU] via SUBCUTANEOUS

## 2020-10-24 MED ORDER — INSULIN GLARGINE 100 UNIT/ML ~~LOC~~ SOLN
10.0000 [IU] | Freq: Every day | SUBCUTANEOUS | Status: DC
  Administered 2020-10-24 – 2020-10-27 (×4): 10 [IU] via SUBCUTANEOUS
  Filled 2020-10-24 (×5): qty 0.1

## 2020-10-24 MED ORDER — PREDNISONE 20 MG PO TABS: 40.0000 mg | ORAL_TABLET | Freq: Every day | ORAL | Status: DC

## 2020-10-24 NOTE — Progress Notes (Signed)
Initial Nutrition Assessment  RD working remotely.  DOCUMENTATION CODES:   Morbid obesity  INTERVENTION:   - Glucerna Shake po TID, each supplement provides 220 kcal and 10 grams of protein  - Encourage adequate PO intake  NUTRITION DIAGNOSIS:   Increased nutrient needs related to chronic illness as evidenced by estimated needs.  GOAL:   Patient will meet greater than or equal to 90% of their needs  MONITOR:   PO intake,Supplement acceptance,Labs,Weight trends,I & O's  REASON FOR ASSESSMENT:   Consult COPD Protocol  ASSESSMENT:   75 year old female who presented to the ED on 5/27 with SOB. PMH of anxiety, depression, osteoarthritis, asthma, bronchitis, CHF, CKD stage IIIa, T2DM, GERD, cholelithiasis, HLD, HTN, hypothyroidism. Pt with recent admission for acute on chronic CHF and discharged to SNF due to physical deconditioning. Pt admitted with CAP, COPD exacerbation.   Pt currently on a Heart Healthy/Carb Modified diet with 100% meal completions charted.  Spoke with pt via phone call to room who reports good appetite and good PO intake. Pt states that she was eating well at SNF and consumed 3 meals daily. Pt denies any recent weight loss and reports a UBW of 210 lbs.  Reviewed weight history in chart. Current weight of 210 lbs (95.3 kg) on admission appears stated rather than measured. If accurate, pt has experienced a 5.9 kg weight loss since 03/17/20. This is a 5.8% weight loss in 7 months which is not significant for timeframe. Suspect some of weight loss can be attributed to fluid shifts given CHF diagnosis. Per nursing documentation, pt with pitting edema to BLE. This may be masking additional weight loss.  Pt requesting Glucerna Shakes. Pt states that she was consuming these at home prior to SNF but not consuming them at [facility]. RD will order Glucerna to aid pt in meeting kcal and protein needs.  Medications reviewed and include: fergon, SSI, lantus 10 units daily,  ativan, IV solu-medrol, protonix, torsemide, IV abx  Labs reviewed: BUN 46, creatinine 3.43, hemoglobin 9.7  NUTRITION - FOCUSED PHYSICAL EXAM:  Unable to complete at this time. RD working remotely.  Diet Order:   Diet Order            Diet heart healthy/carb modified Room service appropriate? Yes; Fluid consistency: Thin  Diet effective now                 EDUCATION NEEDS:   No education needs have been identified at this time  Skin:  Skin Assessment: Reviewed RN Assessment  Last BM:  no documented BM  Height:   Ht Readings from Last 1 Encounters:  10/23/20 4\' 11"  (1.499 m)    Weight:   Wt Readings from Last 1 Encounters:  10/23/20 95.3 kg    BMI:  Body mass index is 42.41 kg/m.  Estimated Nutritional Needs:   Kcal:  1650-1850  Protein:  85-100 grams  Fluid:  1.6-1.8 L    Gustavus Bryant, MS, RD, LDN Inpatient Clinical Dietitian Please see [facility] for contact information.

## 2020-10-24 NOTE — Progress Notes (Signed)
PROGRESS NOTE        PATIENT DETAILS Name: Amanda Cain Age: 75 y.o. Sex: female Date of Birth: [birth year] Admit Date: 10/23/2020 Admitting Physician Reubin Milan, MD PPJ:KDTO, Nathen May, MD  Brief Narrative: Patient is a 75 y.o. female with history of chronic diastolic heart failure, CKD stage IV, DM-2, HTN, bronchial asthma-who presented with shortness of breath-thought to be due to asthma exacerbation.  Significant events: 5/27>> admit for evaluation of shortness of breath-thought to be asthma exacerbation.  Significant studies: 5/27>> CT chest: Subsegmental atelectasis in the lingula/left lower lobe 5/27>> right lower extremity Doppler: No DVT.  Antimicrobial therapy: Doxycycline: 5/27>> Rocephin: 5/27>>  Microbiology data: 5/27>> COVID PCR: Negative  Procedures : None  Consults: None  DVT Prophylaxis : enoxaparin (LOVENOX) injection 40 mg Start: 10/24/20 1000   Subjective: Feels better-no major issues overnight.   Assessment/Plan: Asthma exacerbation: Improved-still wheezing-not yet at baseline-continue bronchodilators/IV steroids-plan on IV antibiotics x3 days.  Chronic diastolic heart failure: Volume status appears stable-hard to assess given obesity but no overt signs of volume overload.  Will resume Demadex.  Hyponatremia: Resolved  CKD stage IV: Creatinine close to baseline-avoid nephrotoxic agents and follow closely.  HTN: Stable-continue hydralazine/Toprol-XL/Cardizem  HLD: Continue statin/Zetia  DM-2 (A1c 6.6 on 2/3): Continue Lantus 10 units-add SSI-follow CBGs  No results for input(s): GLUCAP in the last 72 hours.  GERD: Continue PPI  Hypothyroidism: Continue Synthroid  Depression/trichotillomania: Appears stable-continue Invega, lorazepam, Lexapro  Debility/deconditioning: Worsened by acute illness-needs PT/OT eval.  Currently at [facility] but inquiring about going back home when ready for  discharge.  Morbid Obesity: Estimated body mass index is 42.41 kg/m as calculated from the following:   Height as of this encounter: 4\' 11"  (1.499 m).   Weight as of this encounter: 95.3 kg.    Diet: Diet Order            Diet heart healthy/carb modified Room service appropriate? Yes; Fluid consistency: Thin  Diet effective now                  Code Status: Full code o  Family Communication: None at bedside-we will reach out to family over the next few days.  Disposition Plan: Status is: Observation  The patient will require care spanning > 2 midnights and should be moved to inpatient because: Inpatient level of care appropriate due to severity of illness  Dispo: The patient is from: SNF              Anticipated d/c is to: TBD              Patient currently is not medically stable to d/c.   Difficult to place patient No    Barriers to Discharge: Asthma exacerbation-not yet stable for discharge.  Antimicrobial agents: Anti-infectives (From admission, onward)   Start     Dose/Rate Route Frequency Ordered Stop   10/24/20 2200  cefTRIAXone (ROCEPHIN) 2 g in sodium chloride 0.9 % 100 mL IVPB  Status:  Discontinued        2 g 200 mL/hr over 30 Minutes Intravenous Every 24 hours 10/24/20 0643 10/24/20 0644   10/24/20 2200  cefTRIAXone (ROCEPHIN) 1 g in sodium chloride 0.9 % 100 mL IVPB        1 g 200 mL/hr over 30 Minutes Intravenous Every 24 hours 10/24/20 0644  10/24/20 0015  doxycycline (VIBRA-TABS) tablet 100 mg       Note to Pharmacy: Patient taking differently: Take 100 mg by mouth two times a day/Start date: 10/20/2020 and End date: 10/27/2020     100 mg Oral 2 times daily 10/23/20 2344 10/28/20 0959   10/24/20 0000  cefTRIAXone (ROCEPHIN) 2 g in sodium chloride 0.9 % 100 mL IVPB  Status:  Discontinued        2 g 200 mL/hr over 30 Minutes Intravenous Every 24 hours 10/23/20 2357 10/23/20 2357   10/23/20 2345  cefTRIAXone (ROCEPHIN) 1 g in sodium chloride 0.9 %  100 mL IVPB  Status:  Discontinued        1 g 200 mL/hr over 30 Minutes Intravenous Every 24 hours 10/23/20 2344 10/24/20 0643       Time spent: 35 minutes-Greater than 50% of this time was spent in counseling, explanation of diagnosis, planning of further management, and coordination of care.  MEDICATIONS: Scheduled Meds: . atorvastatin  80 mg Oral QHS  . azelastine  1 spray Each Nare BID  . diclofenac Sodium  2-4 g Topical q AM  . diltiazem  360 mg Oral Daily  . doxycycline  100 mg Oral BID  . enoxaparin (LOVENOX) injection  40 mg Subcutaneous Q24H  . escitalopram  20 mg Oral QHS  . ezetimibe  10 mg Oral Daily  . [START ON 10/26/2020] ferrous gluconate  324 mg Oral Q M,W,F  . fluticasone furoate-vilanterol  1 puff Inhalation q AM  . gabapentin  100 mg Oral BID  . hydrALAZINE  25 mg Oral Q8H  . insulin glargine  10 Units Subcutaneous Daily  . ipratropium-albuterol  3 mL Nebulization Q8H  . levothyroxine  200 mcg Oral Q0600  . LORazepam  0.25 mg Oral BID  . LORazepam  0.5 mg Oral QHS  . methylPREDNISolone (SOLU-MEDROL) injection  40 mg Intravenous Q6H   Followed by  . [START ON 10/27/2020] predniSONE  40 mg Oral Q breakfast  . metoprolol succinate  50 mg Oral q morning  . montelukast  10 mg Oral QHS  . paliperidone  3 mg Oral QHS  . pantoprazole  40 mg Oral QAC breakfast  . polyvinyl alcohol  1 drop Both Eyes BID   Continuous Infusions: . cefTRIAXone (ROCEPHIN)  IV     PRN Meds:.albuterol, benzonatate, ondansetron **OR** ondansetron (ZOFRAN) IV   PHYSICAL EXAM: Vital signs: Vitals:   10/24/20 0400 10/24/20 0430 10/24/20 0700 10/24/20 0730  BP: (!) 145/64 (!) 165/65 (!) 122/94 (!) 179/65  Pulse: 63 67 76 76  Resp: 20 18 16  (!) 22  Temp:      TempSrc:      SpO2: 100% 99% 100% 98%  Weight:      Height:       Filed Weights   10/23/20 1409  Weight: 95.3 kg   Body mass index is 42.41 kg/m.   Gen Exam:Alert awake-not in any distress HEENT:atraumatic,  normocephalic Chest: Scattered rhonchi CVS:S1S2 regular Abdomen:soft non tender, non distended Extremities:+ edema Neurology: Non focal Skin: no rash  I have personally reviewed following labs and imaging studies  LABORATORY DATA: CBC: Recent Labs  Lab 10/23/20 1511 10/24/20 0225  WBC 7.0 7.2  NEUTROABS 5.3  --   HGB 9.0* 9.7*  HCT 29.1* 31.1*  MCV 89.5 89.1  PLT 281 161    Basic Metabolic Panel: Recent Labs  Lab 10/23/20 1511 10/24/20 0225  NA 131* 137  K 3.9 3.5  CL 94* 97*  CO2 27 28  GLUCOSE 92 108*  BUN 46* 46*  CREATININE 3.34* 3.43*  CALCIUM 8.6* 9.2    GFR: Estimated Creatinine Clearance: 14.1 mL/min (A) (by C-G formula based on SCr of 3.43 mg/dL (H)).  Liver Function Tests: Recent Labs  Lab 10/23/20 1511 10/24/20 0225  AST 19 17  ALT 15 17  ALKPHOS 58 65  BILITOT 1.0 0.4  PROT 5.8* 6.2*  ALBUMIN 3.2* 3.3*   No results for input(s): LIPASE, AMYLASE in the last 168 hours. No results for input(s): AMMONIA in the last 168 hours.  Coagulation Profile: No results for input(s): INR, PROTIME in the last 168 hours.  Cardiac Enzymes: No results for input(s): CKTOTAL, CKMB, CKMBINDEX, TROPONINI in the last 168 hours.  BNP (last 3 results) No results for input(s): PROBNP in the last 8760 hours.  Lipid Profile: No results for input(s): CHOL, HDL, LDLCALC, TRIG, CHOLHDL, LDLDIRECT in the last 72 hours.  Thyroid Function Tests: No results for input(s): TSH, T4TOTAL, FREET4, T3FREE, THYROIDAB in the last 72 hours.  Anemia Panel: No results for input(s): VITAMINB12, FOLATE, FERRITIN, TIBC, IRON, RETICCTPCT in the last 72 hours.  Urine analysis:    Component Value Date/Time   COLORURINE YELLOW 09/29/2020 0543   APPEARANCEUR HAZY (A) 09/29/2020 0543   LABSPEC 1.010 09/29/2020 0543   PHURINE 5.0 09/29/2020 0543   GLUCOSEU NEGATIVE 09/29/2020 0543   HGBUR NEGATIVE 09/29/2020 0543   BILIRUBINUR NEGATIVE 09/29/2020 0543   KETONESUR NEGATIVE  09/29/2020 0543   PROTEINUR 100 (A) 09/29/2020 0543   UROBILINOGEN 0.2 10/26/2012 0854   NITRITE NEGATIVE 09/29/2020 0543   LEUKOCYTESUR NEGATIVE 09/29/2020 0543    Sepsis Labs: Lactic Acid, Venous    Component Value Date/Time   LATICACIDVEN 1.0 11/21/2011 1200    MICROBIOLOGY: Recent Results (from the past 240 hour(s))  SARS CORONAVIRUS 2 (TAT 6-24 HRS) Nasopharyngeal Nasopharyngeal Swab     Status: None   Collection Time: 10/23/20  7:47 PM   Specimen: Nasopharyngeal Swab  Result Value Ref Range Status   SARS Coronavirus 2 NEGATIVE NEGATIVE Final    Comment: (NOTE) SARS-CoV-2 target nucleic acids are NOT DETECTED.  The SARS-CoV-2 RNA is generally detectable in upper and lower respiratory specimens during the acute phase of infection. Negative results do not preclude SARS-CoV-2 infection, do not rule out co-infections with other pathogens, and should not be used as the sole basis for treatment or other patient management decisions. Negative results must be combined with clinical observations, patient history, and epidemiological information. The expected result is Negative.  Fact Sheet for Patients: SugarRoll.be  Fact Sheet for Healthcare Providers: https://www.woods-mathews.com/  This test is not yet approved or cleared by the [place] FDA and  has been authorized for detection and/or diagnosis of SARS-CoV-2 by FDA under an Emergency Use Authorization (EUA). This EUA will remain  in effect (meaning this test can be used) for the duration of the COVID-19 declaration under Se ction 564(b)(1) of the Act, 21 U.S.C. section 360bbb-3(b)(1), unless the authorization is terminated or revoked sooner.  Performed at [facility], [place] [address] [place], [place] [address]     RADIOLOGY STUDIES/RESULTS: CT Chest Wo Contrast  Result Date: 10/23/2020 CLINICAL DATA:  Respiratory illness, nondiagnostic xray Shortness of  breath. EXAM: CT CHEST WITHOUT CONTRAST TECHNIQUE: Multidetector CT imaging of the chest was performed following the standard protocol without IV contrast. COMPARISON:  Radiograph earlier today. FINDINGS: Cardiovascular: Aortic atherosclerosis. No aortic aneurysm. Coronary artery calcifications versus  stents. Mitral annulus calcifications. Mild cardiomegaly. No pericardial effusion. Mediastinum/Nodes: No enlarged mediastinal lymph nodes. No obvious hilar adenopathy on this noncontrast exam. No axillary adenopathy. No esophageal wall thickening. Right thyroidectomy. Lungs/Pleura: Subsegmental linear atelectasis in the lingula and left lower lobe. Mild heterogeneous pulmonary parenchyma. No confluent consolidation. No pleural effusion. The trachea and central bronchi are patent. No septal thickening. Upper Abdomen: No acute upper abdominal findings. Musculoskeletal: Thoracic spondylosis with endplate spurring. There are no acute or suspicious osseous abnormalities. IMPRESSION: 1. Mild heterogeneous pulmonary parenchyma, can be seen with small vessel or small airways disease. 2. Subsegmental linear atelectasis in the lingula and left lower lobe. 3. Mild cardiomegaly. Coronary artery calcifications versus stents. Aortic Atherosclerosis (ICD10-I70.0). Electronically Signed   By: Keith Rake M.D.   On: 10/23/2020 19:06   DG Chest Port 1 View  Result Date: 10/23/2020 CLINICAL DATA:  Shortness of breath EXAM: PORTABLE CHEST 1 VIEW COMPARISON:  09/24/2020 FINDINGS: Cardiac shadow is enlarged but stable. Lungs are well aerated bilaterally. Mild central vascular prominence is noted without edema. No bony abnormality is seen. IMPRESSION: Mild central vascular prominence without parenchymal edema. Electronically Signed   By: Inez Catalina M.D.   On: 10/23/2020 15:37   VAS [place] LOWER EXTREMITY VENOUS (DVT) (ONLY [facility] & [facility] 7a-7p)  Result Date: 10/23/2020  Lower Venous DVT Study Patient Name:  TOMMA EHINGER  Date of  Exam:   10/23/2020 Medical Rec #: [id number]           Accession #:    [id number] Date of Birth: 1946-04-19            Patient Gender: F Patient Age:   74Y Exam Location:  [facility] Procedure:      VAS [place] LOWER EXTREMITY VENOUS (DVT) Referring Phys: [id number] ANKIT NANAVATI --------------------------------------------------------------------------------  Indications: Edema.  Limitations: Body habitus, poor ultrasound/tissue interface and restricted mobility. Comparison Study: No prior study Performing Technologist: Maudry Mayhew MHA, RDMS, RVT, RDCS  Examination Guidelines: A complete evaluation includes B-mode imaging, spectral Doppler, color Doppler, and power Doppler as needed of all accessible portions of each vessel. Bilateral testing is considered an integral part of a complete examination. Limited examinations for reoccurring indications may be performed as noted. The reflux portion of the exam is performed with the patient in reverse Trendelenburg.  +-------+---------------+---------+-----------+----------+--------------+ RIGHT  CompressibilityPhasicitySpontaneityPropertiesThrombus Aging +-------+---------------+---------+-----------+----------+--------------+ CFV    Full           Yes      Yes                                 +-------+---------------+---------+-----------+----------+--------------+ SFJ    Full                                                        +-------+---------------+---------+-----------+----------+--------------+ FV ProxFull                                                        +-------+---------------+---------+-----------+----------+--------------+ FV Mid Full                                                        +-------+---------------+---------+-----------+----------+--------------+  PFV    Full                                                        +-------+---------------+---------+-----------+----------+--------------+ POP     Full           Yes      Yes                                 +-------+---------------+---------+-----------+----------+--------------+ PTV    Full                                                        +-------+---------------+---------+-----------+----------+--------------+   Right Technical Findings: Not visualized segments include distal femoral vein, peroneal veins.  Left Technical Findings: Not visualized segments include CFV.   Summary: RIGHT: - There is no evidence of deep vein thrombosis in the lower extremity. However, portions of this examination were limited- see technologist comments above.  - No cystic structure found in the popliteal fossa.   *See table(s) above for measurements and observations.    Preliminary      LOS: 0 days   Oren Binet, MD  [facility]    To contact the attending provider between 7A-7P or the covering provider during after hours 7P-7A, please log into the web site [email or web address] and access using universal [place] password for that web site. If you do not have the password, please call the hospital operator.  10/24/2020, 10:47 AM

## 2020-10-24 NOTE — Evaluation (Addendum)
Occupational Therapy Evaluation Patient Details Name: Amanda Cain MRN: [id number] DOB: 06-23-1945 Today's Date: 10/24/2020    History of Present Illness 75 y.o. female with history of chronic diastolic heart failure, CKD stage IV, DM-2, HTN, bronchial asthma-who presented with shortness of breath-thought to be due to asthma exacerbation.  Patient was at [facility] 3 weeks prior due to SOB and BLE edema weight gain, and was discharged to SNF for post acute rehab.   Clinical Impression   Patient admitted from a SNF for the above diagnosis.  As she tells it, she had graduated from therapy, but was told she needed to stay at the SNF until she could arrange 24 hour supervision at home.  Barriers are listed below.  She does require Min A for ADL completion, and is moving in the room with Clois Dupes at a RW level.  She may very well be at her prior level of function given the PCA she had 7x/wk for 5 hours/day, and the assist her sisters provided for her with respect to groceries and community mobility.  She very much prefers to go home, but would benefit from continued PCA services, probably more than what she was receiving, but OT does not believe she would need exactly 24 hour at home.  PT is trailing her for O2 needs, she did desaturate to 88% on RA with mobility.  Given her history of falls, managing the O2 cord could prove challenging.   Per the patient, her sisters want her to go to an ALF.  OT will continue to follow her in the acute setting to maximize her functional status, and will recommend home with 24 hour assist as needed, and home health services.      Follow Up Recommendations  Home health OT;Supervision/Assistance - 24 hour    Equipment Recommendations  Tub/shower bench    Recommendations for Other Services       Precautions / Restrictions Precautions Precautions: Fall Precaution Comments: watch O2 sats Restrictions Weight Bearing Restrictions: No Other Position/Activity  Restrictions: admits to a fall at [facility] the last time she was here      Mobility Bed Mobility Overal bed mobility: Needs Assistance Bed Mobility: Rolling;Supine to Sit Rolling: Modified independent (Device/Increase time)   Supine to sit: Min assist     General bed mobility comments: states she usually has trouble getting up out of the bed, sleeps in a recliner at home. Patient Response: Cooperative  Transfers Overall transfer level: Needs assistance Equipment used: Rolling walker (2 wheeled) Transfers: Sit to/from [facility] Sit to Stand: Supervision Stand pivot transfers: Min guard            Balance Overall balance assessment: Needs assistance Sitting-balance support: No upper extremity supported;Feet supported Sitting balance-Leahy Scale: Good     Standing balance support: Bilateral upper extremity supported Standing balance-Leahy Scale: Poor Standing balance comment: relies on RW                           ADL either performed or assessed with clinical judgement   ADL Overall ADL's : Needs assistance/impaired     Grooming: Wash/dry hands;Wash/dry face;Supervision/safety;Standing           Upper Body Dressing : Set up;Sitting   Lower Body Dressing: Minimal assistance;Sit to/from stand               Functional mobility during ADLs: Min guard;Rolling walker General ADL Comments: assist to manage lines and leads.  Vision Baseline Vision/History: Wears glasses Wears Glasses: At all times Patient Visual Report: No change from baseline       Perception     Praxis      Pertinent Vitals/Pain Pain Assessment: No/denies pain     Hand Dominance Right   Extremity/Trunk Assessment Upper Extremity Assessment Upper Extremity Assessment: Generalized weakness   Lower Extremity Assessment Lower Extremity Assessment: Defer to PT evaluation   Cervical / Trunk Assessment Cervical / Trunk Assessment: Other  exceptions Cervical / Trunk Exceptions: body habitus   Communication Communication Communication: No difficulties   Cognition Arousal/Alertness: Awake/alert Behavior During Therapy: WFL for tasks assessed/performed Overall Cognitive Status: Within Functional Limits for tasks assessed                                 General Comments: ST memory deficts and mild declines to complex thought noted.   General Comments       Exercises     Shoulder Instructions      Home Living Family/patient expects to be discharged to:: Private residence Living Arrangements: Alone Available Help at Discharge: Personal care attendant;Family;Friend(s);Available PRN/intermittently (7x/wk for 5 hours) Type of Home: Apartment Home Access: Level entry     Home Layout: One level     Bathroom Shower/Tub: Teacher, early years/pre: Handicapped height Bathroom Accessibility: Yes How Accessible: Accessible via walker Home Equipment: [place] - 2 wheels;Cane - single point;Shower seat;Grab bars - tub/shower;Toilet riser;Bedside commode   Additional Comments: aide 5hrs/day 7 days/wk      Prior Functioning/Environment Level of Independence: Needs assistance  Gait / Transfers Assistance Needed: using RW at all times ADL's / Homemaking Assistance Needed: aide helps with some cooking and bathing of pt.  sisters help with community mobility and groceries.  patient states she manages her own meds.   Comments: sleeps in recliner        OT Problem List: Decreased strength;Decreased activity tolerance;Impaired balance (sitting and/or standing);Decreased safety awareness      OT Treatment/Interventions: Self-care/ADL training;Therapeutic exercise;Balance training;Therapeutic activities    OT Goals(Current goals can be found in the care plan section) Acute Rehab OT Goals Patient Stated Goal: I want to go home. OT Goal Formulation: With patient Time For Goal Achievement:  11/07/20 Potential to Achieve Goals: Fair  OT Frequency: Min 2X/week   Barriers to D/C: Decreased caregiver support  patient needs CAP services for PCA in the home.       Co-evaluation              AM-PAC OT "6 Clicks" Daily Activity     Outcome Measure Help from another person eating meals?: None Help from another person taking care of personal grooming?: None Help from another person toileting, which includes using toliet, bedpan, or urinal?: A Little Help from another person bathing (including washing, rinsing, drying)?: A Little Help from another person to put on and taking off regular upper body clothing?: None Help from another person to put on and taking off regular lower body clothing?: A Little 6 Click Score: 21   End of Session Equipment Utilized During Treatment: Rolling walker;Oxygen Nurse Communication: Mobility status  Activity Tolerance: Patient tolerated treatment well Patient left: in chair;with call bell/phone within reach  OT Visit Diagnosis: Unsteadiness on feet (R26.81);Muscle weakness (generalized) (M62.81);History of falling (Z91.81)                Time: 5638-7564 OT Time Calculation (min):  21 min Charges:  OT General Charges $OT Visit: 1 Visit OT Evaluation $OT Eval Moderate Complexity: 1 Mod  10/24/2020  Rich, OTR/L  Acute Rehabilitation Services  Office:  [place] 10/24/2020, 3:56 PM
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
## 2020-10-24 NOTE — Progress Notes (Signed)
Additional PT Note:  SATURATION QUALIFICATIONS: (This note is used to comply with regulatory documentation for home oxygen)  Patient Saturations on Room Air at Rest = 94%  Patient Saturations on Room Air while Ambulating = 88%  Patient Saturations on 2 Liters of oxygen while Ambulating = 98%  Please briefly explain why patient needs home oxygen: Pt becomes SOB and anxious while ambulating on RA which decreases her safety and increases fall risk. On 2L, pt not as dyspneic and takes time and is much safer ambulating.   Leighton Roach, [place]  Pager [phone] Office [phone]

## 2020-10-24 NOTE — Evaluation (Signed)
Physical Therapy Evaluation Patient Details Name: Amanda Cain MRN: [id number] DOB: [birth year] Today's Date: 10/24/2020   History of Present Illness  75 y.o. female with history of chronic diastolic heart failure, CKD stage IV, DM-2, HTN, bronchial asthma-who presented with shortness of breath-thought to be due to asthma exacerbation.  Patient was at [facility] 3 weeks prior due to SOB and BLE edema weight gain, and was discharged to SNF for post acute rehab.  Clinical Impression  Pt admitted with above diagnosis. Pt adamant that she wants to go home instead of back to [place]. Pt ambulated 2 bouts of 15' first without O2 and second with. Pt calmer and safer with O2 and sats remained in upper 90's. On RA sats dropped to 88% and pt with audible DOE. Pt had caregiver 7 days/wk, 5 hrs/day, recommend this be set back up before she goes home as well as getting HHPT.  Pt currently with functional limitations due to the deficits listed below (see PT Problem List). Pt will benefit from skilled PT to increase their independence and safety with mobility to allow discharge to the venue listed below.       Follow Up Recommendations Home health PT;Supervision for mobility/OOB    Equipment Recommendations  Other (comment) (O2)    Recommendations for Other Services       Precautions / Restrictions Precautions Precautions: Fall Precaution Comments: watch O2 sats Restrictions Weight Bearing Restrictions: No Other Position/Activity Restrictions: admits to a fall at [facility] the last time she was here      Mobility  Bed Mobility Overal bed mobility: Needs Assistance Bed Mobility: Rolling;Supine to Sit Rolling: Modified independent (Device/Increase time)   Supine to sit: Min assist     General bed mobility comments: up in chair from OT session    Transfers Overall transfer level: Needs assistance Equipment used: Rolling walker (2 wheeled) Transfers: Sit to/from [facility] Sit to  Stand: Supervision Stand pivot transfers: Min guard       General transfer comment: pt stands without physical assist. Has some difficulty turning and needed vc's for remain in RW to turn safely  Ambulation/Gait Ambulation/Gait assistance: Min guard Gait Distance (Feet): 15 Feet (2x) Assistive device: Rolling walker (2 wheeled) Gait Pattern/deviations: Step-through pattern;Decreased stride length Gait velocity: decreased Gait velocity interpretation: <1.31 ft/sec, indicative of household ambulator General Gait Details: pt ambulated on RA first bout with sats dropping to 88% and pt with audible breathing and less safe during ambulation and more anxiety to get back to sitting. Ambulated on 2L second bout with sats remaining high 90's and pt less anxious and therefore more safe  Stairs            Wheelchair Mobility    Modified [place] (Stroke Patients Only)       Balance Overall balance assessment: Needs assistance Sitting-balance support: No upper extremity supported;Feet supported Sitting balance-Leahy Scale: Good     Standing balance support: Bilateral upper extremity supported Standing balance-Leahy Scale: Poor Standing balance comment: relies on RW                             Pertinent Vitals/Pain Pain Assessment: No/denies pain    Home Living Family/patient expects to be discharged to:: Private residence Living Arrangements: Alone Available Help at Discharge: Personal care attendant;Family;Friend(s);Available PRN/intermittently (7x/wk for 5 hours) Type of Home: Apartment Home Access: Level entry     Home Layout: One level Home Equipment: Walker - 2  wheels;Cane - single point;Shower seat;Grab bars - tub/shower;Toilet riser;Bedside commode Additional Comments: aide 5hrs/day 7 days/wk    Prior Function Level of Independence: Needs assistance   Gait / Transfers Assistance Needed: using RW at all times  ADL's / Homemaking Assistance Needed: aide  helps with some cooking and bathing of pt.  sisters help with community mobility and groceries.  patient states she manages her own meds.  Comments: sleeps in recliner     Hand Dominance   Dominant Hand: Right    Extremity/Trunk Assessment   Upper Extremity Assessment Upper Extremity Assessment: Defer to OT evaluation    Lower Extremity Assessment Lower Extremity Assessment: Generalized weakness    Cervical / Trunk Assessment Cervical / Trunk Assessment: Other exceptions Cervical / Trunk Exceptions: body habitus  Communication   Communication: No difficulties  Cognition Arousal/Alertness: Awake/alert Behavior During Therapy: WFL for tasks assessed/performed Overall Cognitive Status: Within Functional Limits for tasks assessed                                 General Comments: ST memory deficts and mild declines to complex thought noted.      General Comments General comments (skin integrity, edema, etc.): had some knee buckling with bkwd ambulation so practiced turning and sitting right in front of seat so that she would not need to back    Exercises     Assessment/Plan    PT Assessment Patient needs continued PT services  PT Problem List Decreased strength;Decreased activity tolerance;Decreased balance;Decreased mobility;Obesity;Cardiopulmonary status limiting activity       PT Treatment Interventions DME instruction;Gait training;Functional mobility training;Therapeutic activities;Therapeutic exercise;Balance training;Patient/family education    PT Goals (Current goals can be found in the Care Plan section)  Acute Rehab PT Goals Patient Stated Goal: I want to go home. PT Goal Formulation: With patient Time For Goal Achievement: 11/07/20 Potential to Achieve Goals: Good    Frequency Min 3X/week   Barriers to discharge Decreased caregiver support needs to have caregiver again 7 days/wk, 5 hrs/day before going home    Co-evaluation                AM-PAC PT "6 Clicks" Mobility  Outcome Measure Help needed turning from your back to your side while in a flat bed without using bedrails?: A Little Help needed moving from lying on your back to sitting on the side of a flat bed without using bedrails?: A Little Help needed moving to and from a bed to a chair (including a wheelchair)?: A Little Help needed standing up from a chair using your arms (e.g., wheelchair or bedside chair)?: A Little Help needed to walk in hospital room?: A Little Help needed climbing 3-5 steps with a railing? : A Lot 6 Click Score: 17    End of Session Equipment Utilized During Treatment: Oxygen Activity Tolerance: Patient tolerated treatment well Patient left: in chair;with call bell/phone within reach Nurse Communication: Mobility status;Other (comment) (need to ambulate to bathroom for increased mobility) PT Visit Diagnosis: Muscle weakness (generalized) (M62.81);Difficulty in walking, not elsewhere classified (R26.2);History of falling (Z91.81)    Time: 8921-1941 PT Time Calculation (min) (ACUTE ONLY): 22 min   Charges:   PT Evaluation $PT Eval Moderate Complexity: [place]  Pager [phone] Office [place] 10/24/2020, 4:29 PM

## 2020-10-25 ENCOUNTER — Inpatient Hospital Stay (HOSPITAL_COMMUNITY): Payer: Medicare Other

## 2020-10-25 DIAGNOSIS — E785 Hyperlipidemia, unspecified: Secondary | ICD-10-CM | POA: Diagnosis not present

## 2020-10-25 DIAGNOSIS — I10 Essential (primary) hypertension: Secondary | ICD-10-CM | POA: Diagnosis not present

## 2020-10-25 DIAGNOSIS — J45901 Unspecified asthma with (acute) exacerbation: Secondary | ICD-10-CM | POA: Diagnosis not present

## 2020-10-25 DIAGNOSIS — K219 Gastro-esophageal reflux disease without esophagitis: Secondary | ICD-10-CM | POA: Diagnosis not present

## 2020-10-25 LAB — BASIC METABOLIC PANEL
Anion gap: 7 (ref 5–15)
BUN: 58 mg/dL — ABNORMAL HIGH (ref 8–23)
CO2: 28 mmol/L (ref 22–32)
Calcium: 8.9 mg/dL (ref 8.9–10.3)
Chloride: 100 mmol/L (ref 98–111)
Creatinine, Ser: 3.45 mg/dL — ABNORMAL HIGH (ref 0.44–1.00)
GFR, Estimated: 13 mL/min — ABNORMAL LOW (ref >60)
Glucose, Bld: 179 mg/dL — ABNORMAL HIGH (ref 70–99)
Potassium: 4.3 mmol/L (ref 3.5–5.1)
Sodium: 135 mmol/L (ref 135–145)

## 2020-10-25 LAB — CBC
HCT: 28.8 % — ABNORMAL LOW (ref 36.0–46.0)
Hemoglobin: 9.1 g/dL — ABNORMAL LOW (ref 12.0–15.0)
MCH: 27.7 pg (ref 26.0–34.0)
MCHC: 31.6 g/dL (ref 30.0–36.0)
MCV: 87.8 fL (ref 80.0–100.0)
Platelets: 289 10*3/uL (ref 150–400)
RBC: 3.28 MIL/uL — ABNORMAL LOW (ref 3.87–5.11)
RDW: 15.9 % — ABNORMAL HIGH (ref 11.5–15.5)
WBC: 6.9 10*3/uL (ref 4.0–10.5)
nRBC: 0 % (ref 0.0–0.2)

## 2020-10-25 LAB — GLUCOSE, CAPILLARY
Glucose-Capillary: 155 mg/dL — ABNORMAL HIGH (ref 70–99)
Glucose-Capillary: 224 mg/dL — ABNORMAL HIGH (ref 70–99)
Glucose-Capillary: 194 mg/dL — ABNORMAL HIGH (ref 70–99)
Glucose-Capillary: 157 mg/dL — ABNORMAL HIGH (ref 70–99)

## 2020-10-25 MED ORDER — PREDNISONE 20 MG PO TABS
40.0000 mg | ORAL_TABLET | Freq: Every day | ORAL | Status: DC
  Administered 2020-10-26: 40 mg via ORAL
  Filled 2020-10-25: qty 2

## 2020-10-25 NOTE — NC FL2 (Signed)
[place] LEVEL OF CARE SCREENING TOOL     IDENTIFICATION  Patient Name: Amanda Cain Birthdate: September 15, 1945 Sex: female Admission Date (Current Location): 10/23/2020  [facility] and [place] Number:  Herbalist and Address:  [facility] [place], [place] [address], [place], [place] [address]      Provider Number: [id number]  Attending Physician Name and Address:  Jonetta Osgood, MD  Relative Name and Phone Number:  [name] [phone]    Current Level of Care: Hospital Recommended Level of Care: [place] Prior Approval Number:    Date Approved/Denied:   PASRR Number: [id number]  Discharge Plan: SNF    Current Diagnoses: Patient Active Problem List   Diagnosis Date Noted  . Mild protein malnutrition ([place]) 10/24/2020  . Class 3 obesity 10/24/2020  . Aortic atherosclerosis ([place]) 10/24/2020  . Dyspnea 10/23/2020  . Acute CHF (congestive heart failure) ([place]) 09/24/2020  . Acute exacerbation of CHF (congestive heart failure) ([place]) 04/16/2020  . Acute on chronic diastolic (congestive) heart failure ([place]) 02/20/2020  . Hypokalemia   . Anxiety   . Physical deconditioning 03/13/2019  . Acute recurrent maxillary sinusitis 03/13/2019  . Pain due to onychomycosis of toenails of both feet 11/21/2018  . Shortness of breath 08/09/2018  . Asthma 06/19/2018  . Chronic respiratory failure with hypoxia ([place]) 05/15/2018  . Pulmonary hypertension ([place]) 05/14/2018  . Atypical chest pain 05/12/2018  . Community acquired pneumonia 12/13/2017  . CAP (community acquired pneumonia) 12/13/2017  . CHF (congestive heart failure) ([place]) 12/12/2017  . Morbid obesity ([place]) 11/21/2017  . Flu 07/07/2017  . Influenza A 07/05/2017  . Acute on chronic respiratory failure with hypoxia ([place]) 07/05/2017  . Ascites   . Acute on chronic diastolic heart failure ([place]) 11/17/2016  . CKD (chronic kidney disease), stage III ([place]) 11/17/2016  . S/P  laparoscopic cholecystectomy 03/02/2015  . Hyperlipidemia 11/25/2012  . Depression 11/25/2012  . GERD (gastroesophageal reflux disease) 11/25/2012  . Postoperative anemia due to acute blood loss 11/01/2012  . Osteoarthritis of right knee 10/31/2012  . Hyperglycemia 11/30/2011  . SBP (spontaneous bacterial peritonitis) ([place]) 11/25/2011  . Syncope and collapse 11/20/2011  . Abdominal pain 11/19/2011  . Hyponatremia 11/19/2011  . Normocytic anemia 11/19/2011  . Acute kidney injury superimposed on CKD ([place]) 11/19/2011  . Moderate protein-calorie malnutrition ([place]) 11/19/2011  . Essential hypertension 11/19/2011  . Hypothyroidism, acquired 11/19/2011  . Dyslipidemia 11/19/2011  . Diabetes ([place]) 07/15/2011    Orientation RESPIRATION BLADDER Height & Weight     Self,Time,Situation,Place  O2 (Nasal Cannula 2 liters) Incontinent,External catheter (External Urinary Catheter) Weight: 209 lb 10.5 oz (95.1 kg) Height:  4\' 11"  (149.9 cm)  BEHAVIORAL SYMPTOMS/MOOD NEUROLOGICAL BOWEL NUTRITION STATUS      Continent Diet (See Discharge Summary)  AMBULATORY STATUS COMMUNICATION OF NEEDS Skin   Limited Assist Verbally Normal                       Personal Care Assistance Level of Assistance  Bathing,Feeding,Dressing Bathing Assistance: Maximum assistance Feeding assistance: Independent Dressing Assistance: Maximum assistance     Functional Limitations Info  Sight,Hearing,Speech Sight Info: Adequate Hearing Info: Adequate Speech Info: Adequate    SPECIAL CARE FACTORS FREQUENCY  PT (By licensed PT),OT (By licensed OT)     PT Frequency: 5x min weekly OT Frequency: 5x min weekly            Contractures Contractures Info: Not present    Additional  Factors Info  Code Status,Allergies,Psychotropic,Insulin Sliding Scale Code Status Info: FULL Allergies Info: Ace Inhibitors,Amlodipine,Aspirin Psychotropic Info: escitalopram (LEXAPRO) tablet 20 mg daily at bedtime,LORazepam  (ATIVAN) tablet 0.25 mg 2 times daily,LORazepam (ATIVAN) tablet 0.5 mg daily at bedtime Insulin Sliding Scale Info: insulin aspart (novoLOG) injection 0-6 Units 3 times daily with meals,insulin glargine (LANTUS) injection 10 Units daily,       Current Medications (10/25/2020):  This is the current hospital active medication list Current Facility-Administered Medications  Medication Dose Route Frequency Provider Last Rate Last Admin  . albuterol (PROVENTIL) (2.5 MG/3ML) 0.083% nebulizer solution 3 mL  3 mL Inhalation Q4H PRN Reubin Milan, MD      . atorvastatin (LIPITOR) tablet 80 mg  80 mg Oral QHS Reubin Milan, MD   80 mg at 10/24/20 2222  . azelastine (ASTELIN) 0.1 % nasal spray 1 spray  1 spray Each Nare BID Reubin Milan, MD   1 spray at 10/24/20 2252  . benzonatate (TESSALON) capsule 100 mg  100 mg Oral TID PRN Reubin Milan, MD      . cefTRIAXone (ROCEPHIN) 1 g in sodium chloride 0.9 % 100 mL IVPB  1 g Intravenous Q24H Reubin Milan, MD 200 mL/hr at 10/24/20 2240 1 g at 10/24/20 2240  . diclofenac Sodium (VOLTAREN) 1 % topical gel 2-4 g  2-4 g Topical q AM Reubin Milan, MD   2 g at 10/25/20 [phone]  . diltiazem (CARDIZEM CD) 24 hr capsule 360 mg  360 mg Oral Daily Reubin Milan, MD   360 mg at 10/25/20 0827  . doxycycline (VIBRA-TABS) tablet 100 mg  100 mg Oral BID Reubin Milan, MD   100 mg at 10/25/20 0827  . enoxaparin (LOVENOX) injection 30 mg  30 mg Subcutaneous Q24H Jonetta Osgood, MD   30 mg at 10/25/20 1308  . escitalopram (LEXAPRO) tablet 20 mg  20 mg Oral QHS Reubin Milan, MD   20 mg at 10/24/20 2227  . ezetimibe (ZETIA) tablet 10 mg  10 mg Oral Daily Reubin Milan, MD   10 mg at 10/25/20 6578  . feeding supplement (GLUCERNA SHAKE) (GLUCERNA SHAKE) liquid 237 mL  237 mL Oral TID BM Jonetta Osgood, MD   237 mL at 10/25/20 0828  . [START ON 10/26/2020] ferrous gluconate (FERGON) tablet 324 mg  324 mg Oral Q M,W,F  Reubin Milan, MD      . fluticasone furoate-vilanterol (BREO ELLIPTA) 200-25 MCG/INH 1 puff  1 puff Inhalation q AM Reubin Milan, MD   1 puff at 10/25/20 0749  . gabapentin (NEURONTIN) capsule 100 mg  100 mg Oral BID Reubin Milan, MD   100 mg at 10/25/20 0827  . hydrALAZINE (APRESOLINE) tablet 25 mg  25 mg Oral Q8H Reubin Milan, MD   25 mg at 10/25/20 0719  . insulin aspart (novoLOG) injection 0-6 Units  0-6 Units Subcutaneous TID WC Jonetta Osgood, MD   1 Units at 10/25/20 [phone]  . insulin glargine (LANTUS) injection 10 Units  10 Units Subcutaneous Daily Jonetta Osgood, MD   10 Units at 10/25/20 [phone]  . ipratropium-albuterol (DUONEB) 0.5-2.5 (3) MG/3ML nebulizer solution 3 mL  3 mL Nebulization BID Jonetta Osgood, MD   3 mL at 10/25/20 0749  . levothyroxine (SYNTHROID) tablet 200 mcg  200 mcg Oral Q0600 Reubin Milan, MD   200 mcg at 10/25/20 0719  . LORazepam (ATIVAN) tablet 0.25 mg  0.25 mg Oral BID Reubin Milan, MD   0.25 mg at 10/25/20 0831  . LORazepam (ATIVAN) tablet 0.5 mg  0.5 mg Oral QHS Reubin Milan, MD   0.5 mg at 10/24/20 2222  . metoprolol succinate (TOPROL-XL) 24 hr tablet 50 mg  50 mg Oral q morning Reubin Milan, MD   50 mg at 10/25/20 2836  . montelukast (SINGULAIR) tablet 10 mg  10 mg Oral QHS Reubin Milan, MD   10 mg at 10/24/20 2228  . ondansetron (ZOFRAN) tablet 4 mg  4 mg Oral Q6H PRN Reubin Milan, MD       Or  . ondansetron [facility]) injection 4 mg  4 mg Intravenous Q6H PRN Reubin Milan, MD      . paliperidone [facility]) 24 hr tablet 3 mg  3 mg Oral QHS Reubin Milan, MD   3 mg at 10/24/20 2231  . pantoprazole (PROTONIX) EC tablet 40 mg  40 mg Oral QAC breakfast Reubin Milan, MD   40 mg at 10/25/20 6294  . polyvinyl alcohol (LIQUIFILM TEARS) 1.4 % ophthalmic solution 1 drop  1 drop Both Eyes BID Reubin Milan, MD   1 drop at 10/24/20 2253  . [START ON 10/26/2020] predniSONE  (DELTASONE) tablet 40 mg  40 mg Oral Q breakfast Ghimire, Shanker M, MD      . torsemide [facility]) tablet 40 mg  40 mg Oral q AM Ghimire, Henreitta Leber, MD   40 mg at 10/25/20 7654     Discharge Medications: Please see discharge summary for a list of discharge medications.  Relevant Imaging Results:  Relevant Lab Results:   Additional Information [id number]  Trula Ore, LCSWA

## 2020-10-25 NOTE — TOC Progression Note (Signed)
Transition of Care [facility]) - Progression Note    Patient Details  Name: Aneya Daddona MRN: [id number] Date of Birth: 1945-06-24  Transition of Care [facility]) CM/SW [place], [place] Phone Number: 10/25/2020, 4:14 PM  Clinical Narrative:     CSW started insurance authorization for patient. Requested start date is for 10/26/20. Reference number is #[id number]. CSW will continue to follow and assist with discharge planning needs.   Expected Discharge Plan: Skilled Nursing Facility Barriers to Discharge: Continued Medical Work up  Expected Discharge Plan and Services Expected Discharge Plan: [place] In-house Referral: Clinical Social Work     Living arrangements for the past 2 months: [place] (was recently at [facility] but lives at home alone)                                       Social Determinants of Health ([place]) Interventions    Readmission Risk Interventions Readmission Risk Prevention Plan 02/26/2020  Transportation Screening Complete  Medication Review Press [employer]) Complete  PCP or Specialist appointment within 3-5 days of discharge Complete  SW Recovery Care/Counseling Consult Complete  [place] Patient Refused  Some recent data might be hidden

## 2020-10-25 NOTE — Progress Notes (Signed)
PROGRESS NOTE        PATIENT DETAILS Name: Amanda Cain Age: 75 y.o. Sex: female Date of Birth: 12/20/1945 Admit Date: 10/23/2020 Admitting Physician No admitting provider for patient encounter. MLY:YTKP, Kimberlee, MD  Brief Narrative: Patient is a 75 y.o. female with history of chronic diastolic heart failure, CKD stage IV, DM-2, HTN, bronchial asthma-who presented with shortness of breath-thought to be due to asthma exacerbation.  Significant events: 5/27>> admit for evaluation of shortness of breath-thought to be asthma exacerbation.  Significant studies: 5/27>> CT chest: Subsegmental atelectasis in the lingula/left lower lobe 5/27>> right lower extremity Doppler: No DVT.  Antimicrobial therapy: Doxycycline: 5/27>> Rocephin: 5/27>>  Microbiology data: 5/27>> COVID PCR: Negative  Procedures : None  Consults: None  DVT Prophylaxis : enoxaparin (LOVENOX) injection 30 mg Start: 10/25/20 1000   Subjective: Feels overall better-inquiring whether she can go home.   Assessment/Plan: Asthma exacerbation: Much improved-hardly any wheezing today-continue bronchodilators-stop IV steroids-transition to oral prednisone.  Do not think she requires antibiotics through today.  Chronic diastolic heart failure: Volume status appears stable-on Demadex.   Hyponatremia: Resolved  CKD stage IV: Creatinine close to baseline-avoid nephrotoxic agents and follow closely.  Needs outpatient follow-up with nephrology.  HTN: Controlled-remains on hydralazine/Toprol/Cardizem.   HLD: Continue statin/Zetia  DM-2 (A1c 6.6 on 2/3): Continue Lantus 10 units-add SSI-follow CBGs  Recent Labs    10/24/20 2214 10/25/20 0640 10/25/20 1131  GLUCAP 259* 155* 224*    GERD: Continue PPI  Hypothyroidism: Continue Synthroid  Depression/trichotillomania: Appears stable-continue Invega, lorazepam, Lexapro  Debility/deconditioning: Worsened by acute illness-she  was inquiring whether she could go home-I asked case management today to evaluate her-if she could go home with maximal home health services-after case management evaluation-it is felt that she is still appropriate for SNF.  Morbid Obesity: Estimated body mass index is 42.35 kg/m as calculated from the following:   Height as of this encounter: 4\' 11"  (1.499 m).   Weight as of this encounter: 95.1 kg.    Diet: Diet Order            Diet heart healthy/carb modified Room service appropriate? Yes; Fluid consistency: Thin  Diet effective now                  Code Status: Full code o  Family Communication: None at bedside-we will reach out to family over the next few days.  Disposition Plan: Status is: Observation  The patient will require care spanning > 2 midnights and should be moved to inpatient because: Inpatient level of care appropriate due to severity of illness  Dispo: The patient is from: SNF              Anticipated d/c is to: TBD              Patient currently is not medically stable to d/c.   Difficult to place patient No    Barriers to Discharge: Asthma exacerbation-not yet stable for discharge.  Antimicrobial agents: Anti-infectives (From admission, onward)   Start     Dose/Rate Route Frequency Ordered Stop   10/24/20 2200  cefTRIAXone (ROCEPHIN) 2 g in sodium chloride 0.9 % 100 mL IVPB  Status:  Discontinued        2 g 200 mL/hr over 30 Minutes Intravenous Every 24 hours 10/24/20 0643 10/24/20 0644   10/24/20 2200  cefTRIAXone (ROCEPHIN) 1 g in sodium chloride 0.9 % 100 mL IVPB        1 g 200 mL/hr over 30 Minutes Intravenous Every 24 hours 10/24/20 0644     10/24/20 0015  doxycycline (VIBRA-TABS) tablet 100 mg       Note to Pharmacy: Patient taking differently: Take 100 mg by mouth two times a day/Start date: 10/20/2020 and End date: 10/27/2020     100 mg Oral 2 times daily 10/23/20 2344 10/28/20 0959   10/24/20 0000  cefTRIAXone (ROCEPHIN) 2 g in sodium  chloride 0.9 % 100 mL IVPB  Status:  Discontinued        2 g 200 mL/hr over 30 Minutes Intravenous Every 24 hours 10/23/20 2357 10/23/20 2357   10/23/20 2345  cefTRIAXone (ROCEPHIN) 1 g in sodium chloride 0.9 % 100 mL IVPB  Status:  Discontinued        1 g 200 mL/hr over 30 Minutes Intravenous Every 24 hours 10/23/20 2344 10/24/20 0643       Time spent: 25 minutes-Greater than 50% of this time was spent in counseling, explanation of diagnosis, planning of further management, and coordination of care.  MEDICATIONS: Scheduled Meds: . atorvastatin  80 mg Oral QHS  . azelastine  1 spray Each Nare BID  . diclofenac Sodium  2-4 g Topical q AM  . diltiazem  360 mg Oral Daily  . doxycycline  100 mg Oral BID  . enoxaparin (LOVENOX) injection  30 mg Subcutaneous Q24H  . escitalopram  20 mg Oral QHS  . ezetimibe  10 mg Oral Daily  . feeding supplement (GLUCERNA SHAKE)  237 mL Oral TID BM  . [START ON 10/26/2020] ferrous gluconate  324 mg Oral Q M,W,F  . fluticasone furoate-vilanterol  1 puff Inhalation q AM  . gabapentin  100 mg Oral BID  . hydrALAZINE  25 mg Oral Q8H  . insulin aspart  0-6 Units Subcutaneous TID WC  . insulin glargine  10 Units Subcutaneous Daily  . ipratropium-albuterol  3 mL Nebulization BID  . levothyroxine  200 mcg Oral Q0600  . LORazepam  0.25 mg Oral BID  . LORazepam  0.5 mg Oral QHS  . metoprolol succinate  50 mg Oral q morning  . montelukast  10 mg Oral QHS  . paliperidone  3 mg Oral QHS  . pantoprazole  40 mg Oral QAC breakfast  . polyvinyl alcohol  1 drop Both Eyes BID  . [START ON 10/26/2020] predniSONE  40 mg Oral Q breakfast  . torsemide  40 mg Oral q AM   Continuous Infusions: . cefTRIAXone (ROCEPHIN)  IV 1 g (10/24/20 2240)   PRN Meds:.albuterol, benzonatate, ondansetron **OR** ondansetron (ZOFRAN) IV   PHYSICAL EXAM: Vital signs: Vitals:   10/25/20 0400 10/25/20 0700 10/25/20 0750 10/25/20 1201  BP:      Pulse:      Resp:      Temp: 98.4 F  (36.9 C)   (!) 97.5 F (36.4 C)  TempSrc: Oral   Oral  SpO2:   99%   Weight:  95.1 kg    Height:       Filed Weights   10/23/20 1409 10/25/20 0700  Weight: 95.3 kg 95.1 kg   Body mass index is 42.35 kg/m.   Gen Exam:Alert awake-not in any distress HEENT:atraumatic, normocephalic Chest: B/L clear to auscultation anteriorly CVS:S1S2 regular Abdomen:soft non tender, non distended Extremities:trace edema Neurology: Non focal Skin: no rash  I have personally reviewed following  labs and imaging studies  LABORATORY DATA: CBC: Recent Labs  Lab 10/23/20 1511 10/24/20 0225 10/25/20 0621  WBC 7.0 7.2 6.9  NEUTROABS 5.3  --   --   HGB 9.0* 9.7* 9.1*  HCT 29.1* 31.1* 28.8*  MCV 89.5 89.1 87.8  PLT 281 270 650    Basic Metabolic Panel: Recent Labs  Lab 10/23/20 1511 10/24/20 0225 10/25/20 0621  NA 131* 137 135  K 3.9 3.5 4.3  CL 94* 97* 100  CO2 27 28 28   GLUCOSE 92 108* 179*  BUN 46* 46* 58*  CREATININE 3.34* 3.43* 3.45*  CALCIUM 8.6* 9.2 8.9    GFR: Estimated Creatinine Clearance: 14 mL/min (A) (by C-G formula based on SCr of 3.45 mg/dL (H)).  Liver Function Tests: Recent Labs  Lab 10/23/20 1511 10/24/20 0225  AST 19 17  ALT 15 17  ALKPHOS 58 65  BILITOT 1.0 0.4  PROT 5.8* 6.2*  ALBUMIN 3.2* 3.3*   No results for input(s): LIPASE, AMYLASE in the last 168 hours. No results for input(s): AMMONIA in the last 168 hours.  Coagulation Profile: No results for input(s): INR, PROTIME in the last 168 hours.  Cardiac Enzymes: No results for input(s): CKTOTAL, CKMB, CKMBINDEX, TROPONINI in the last 168 hours.  BNP (last 3 results) No results for input(s): PROBNP in the last 8760 hours.  Lipid Profile: No results for input(s): CHOL, HDL, LDLCALC, TRIG, CHOLHDL, LDLDIRECT in the last 72 hours.  Thyroid Function Tests: No results for input(s): TSH, T4TOTAL, FREET4, T3FREE, THYROIDAB in the last 72 hours.  Anemia Panel: No results for input(s):  VITAMINB12, FOLATE, FERRITIN, TIBC, IRON, RETICCTPCT in the last 72 hours.  Urine analysis:    Component Value Date/Time   COLORURINE YELLOW 09/29/2020 0543   APPEARANCEUR HAZY (A) 09/29/2020 0543   LABSPEC 1.010 09/29/2020 0543   PHURINE 5.0 09/29/2020 0543   GLUCOSEU NEGATIVE 09/29/2020 0543   HGBUR NEGATIVE 09/29/2020 0543   BILIRUBINUR NEGATIVE 09/29/2020 0543   KETONESUR NEGATIVE 09/29/2020 0543   PROTEINUR 100 (A) 09/29/2020 0543   UROBILINOGEN 0.2 10/26/2012 0854   NITRITE NEGATIVE 09/29/2020 0543   LEUKOCYTESUR NEGATIVE 09/29/2020 0543    Sepsis Labs: Lactic Acid, Venous    Component Value Date/Time   LATICACIDVEN 1.0 11/21/2011 1200    MICROBIOLOGY: Recent Results (from the past 240 hour(s))  SARS CORONAVIRUS 2 (TAT 6-24 HRS) Nasopharyngeal Nasopharyngeal Swab     Status: None   Collection Time: 10/23/20  7:47 PM   Specimen: Nasopharyngeal Swab  Result Value Ref Range Status   SARS Coronavirus 2 NEGATIVE NEGATIVE Final    Comment: (NOTE) SARS-CoV-2 target nucleic acids are NOT DETECTED.  The SARS-CoV-2 RNA is generally detectable in upper and lower respiratory specimens during the acute phase of infection. Negative results do not preclude SARS-CoV-2 infection, do not rule out co-infections with other pathogens, and should not be used as the sole basis for treatment or other patient management decisions. Negative results must be combined with clinical observations, patient history, and epidemiological information. The expected result is Negative.  Fact Sheet for Patients: SugarRoll.be  Fact Sheet for Healthcare Providers: https://www.woods-mathews.com/  This test is not yet approved or cleared by the [place] FDA and  has been authorized for detection and/or diagnosis of SARS-CoV-2 by FDA under an Emergency Use Authorization (EUA). This EUA will remain  in effect (meaning this test can be used) for the duration  of the COVID-19 declaration under Se ction 564(b)(1) of the Act, 21 U.S.C.  section 360bbb-3(b)(1), unless the authorization is terminated or revoked sooner.  Performed at [facility], [place] [address] [place], [place] [address]     RADIOLOGY STUDIES/RESULTS: CT Chest Wo Contrast  Result Date: 10/23/2020 CLINICAL DATA:  Respiratory illness, nondiagnostic xray Shortness of breath. EXAM: CT CHEST WITHOUT CONTRAST TECHNIQUE: Multidetector CT imaging of the chest was performed following the standard protocol without IV contrast. COMPARISON:  Radiograph earlier today. FINDINGS: Cardiovascular: Aortic atherosclerosis. No aortic aneurysm. Coronary artery calcifications versus stents. Mitral annulus calcifications. Mild cardiomegaly. No pericardial effusion. Mediastinum/Nodes: No enlarged mediastinal lymph nodes. No obvious hilar adenopathy on this noncontrast exam. No axillary adenopathy. No esophageal wall thickening. Right thyroidectomy. Lungs/Pleura: Subsegmental linear atelectasis in the lingula and left lower lobe. Mild heterogeneous pulmonary parenchyma. No confluent consolidation. No pleural effusion. The trachea and central bronchi are patent. No septal thickening. Upper Abdomen: No acute upper abdominal findings. Musculoskeletal: Thoracic spondylosis with endplate spurring. There are no acute or suspicious osseous abnormalities. IMPRESSION: 1. Mild heterogeneous pulmonary parenchyma, can be seen with small vessel or small airways disease. 2. Subsegmental linear atelectasis in the lingula and left lower lobe. 3. Mild cardiomegaly. Coronary artery calcifications versus stents. Aortic Atherosclerosis (ICD10-I70.0). Electronically Signed   By: Keith Rake M.D.   On: 10/23/2020 19:06   DG Chest Port 1 View  Result Date: 10/23/2020 CLINICAL DATA:  Shortness of breath EXAM: PORTABLE CHEST 1 VIEW COMPARISON:  09/24/2020 FINDINGS: Cardiac shadow is enlarged but stable. Lungs are well aerated  bilaterally. Mild central vascular prominence is noted without edema. No bony abnormality is seen. IMPRESSION: Mild central vascular prominence without parenchymal edema. Electronically Signed   By: Inez Catalina M.D.   On: 10/23/2020 15:37   VAS [place] LOWER EXTREMITY VENOUS (DVT) (ONLY [facility] & [facility] 7a-7p)  Result Date: 10/24/2020  Lower Venous DVT Study Patient Name:  ORABELLE RYLEE  Date of Exam:   10/23/2020 Medical Rec #: [id number]           Accession #:    [id number] Date of Birth: [birth year]            Patient Gender: F Patient Age:   74Y Exam Location:  [facility] Procedure:      VAS [place] LOWER EXTREMITY VENOUS (DVT) Referring Phys: [id number] ANKIT NANAVATI --------------------------------------------------------------------------------  Indications: Edema.  Limitations: Body habitus, poor ultrasound/tissue interface and restricted mobility. Comparison Study: No prior study Performing Technologist: Maudry Mayhew MHA, RDMS, RVT, RDCS  Examination Guidelines: A complete evaluation includes B-mode imaging, spectral Doppler, color Doppler, and power Doppler as needed of all accessible portions of each vessel. Bilateral testing is considered an integral part of a complete examination. Limited examinations for reoccurring indications may be performed as noted. The reflux portion of the exam is performed with the patient in reverse Trendelenburg.  +-------+---------------+---------+-----------+----------+--------------+ RIGHT  CompressibilityPhasicitySpontaneityPropertiesThrombus Aging +-------+---------------+---------+-----------+----------+--------------+ CFV    Full           Yes      Yes                                 +-------+---------------+---------+-----------+----------+--------------+ SFJ    Full                                                        +-------+---------------+---------+-----------+----------+--------------+  FV ProxFull                                                         +-------+---------------+---------+-----------+----------+--------------+ FV Mid Full                                                        +-------+---------------+---------+-----------+----------+--------------+ PFV    Full                                                        +-------+---------------+---------+-----------+----------+--------------+ POP    Full           Yes      Yes                                 +-------+---------------+---------+-----------+----------+--------------+ PTV    Full                                                        +-------+---------------+---------+-----------+----------+--------------+   Right Technical Findings: Not visualized segments include distal femoral vein, peroneal veins.  Left Technical Findings: Not visualized segments include CFV.   Summary: RIGHT: - There is no evidence of deep vein thrombosis in the lower extremity. However, portions of this examination were limited- see technologist comments above.  - No cystic structure found in the popliteal fossa.   *See table(s) above for measurements and observations. Electronically signed by Monica Martinez MD on 10/24/2020 at 11:01:12 AM.    Final      LOS: 1 day   Oren Binet, MD  [facility]    To contact the attending provider between 7A-7P or the covering provider during after hours 7P-7A, please log into the web site [email or web address] and access using universal [place] password for that web site. If you do not have the password, please call the hospital operator.  10/25/2020, 1:23 PM

## 2020-10-25 NOTE — Progress Notes (Signed)
Received referral to assist pt with the D/C plan. Pt admitted from [facility]. Met with pt. Pt reports that she lives alone. She has an aide everyday from 8-1 pm. She reports that she has a sister that lives nearby, but her sister has children and 2 dogs, and she can't stay with her. She walks with a RW. She has a 3-1-BSC. She is on O2 at home. [facility] provides her O2 supplies. Discussed the D/C plan and the importance of a safe D/C plan. She reports that she was able to live alone prior to her last admission, but not anymore. She is not able to get OOB without assistance, it takes 2 people to help her. She reports that she came from [facility], but she did not like it. Informed pt that the SW will assist with SNF placement and she agreed. Notified MD and SW of the D/C plan.

## 2020-10-25 NOTE — TOC Initial Note (Signed)
Transition of Care [facility]) - Initial/Assessment Note    Patient Details  Name: Amanda Cain MRN: [id number] Date of Birth: December 07, 1945  [facility] CM/SW Contact:    Trula Ore, [place] Phone Number: 10/25/2020, 10:57 AM  Clinical Narrative:                  CSW received consult for possible SNF placement at time of discharge. CSW spoke with patient at bedside regarding PT recommendation of SNF placement at time of discharge. Patient comes from [facility] short term.Patient lives at home alone.Patient expressed understanding of PT recommendation and is agreeable to SNF placement at time of discharge.Patient reports to CSW that [facility] and her have agreement that she is in her copay days. Patient confirmed with CSW that she understands and agrees that she will have copay at SNF. Patient reports preference for [facility] . Patient has received the COVID vaccines as well as booster.  No further questions reported at this time. CSW to continue to follow and assist with discharge planning needs.  Expected Discharge Plan: Skilled Nursing Facility Barriers to Discharge: Continued Medical Work up   Patient Goals and CMS Choice Patient states their goals for this hospitalization and ongoing recovery are:: to go to SNF CMS Medicare.gov Compare Post Acute Care list provided to:: Patient Choice offered to / list presented to : Patient  Expected Discharge Plan and Services Expected Discharge Plan: [place] In-house Referral: Clinical Social Work     Living arrangements for the past 2 months: [place] (was recently at [facility] but lives at home alone)                                      Prior Living Arrangements/Services Living arrangements for the past 2 months: [place] (was recently at [facility] but lives at home alone) Lives with:: Self Patient language and need for interpreter reviewed:: Yes Do you feel safe  going back to the place where you live?: No   SNF  Need for Family Participation in Patient Care: Yes (Comment) Care giver support system in place?: Yes (comment)   Criminal Activity/Legal Involvement Pertinent to Current Situation/Hospitalization: No - Comment as needed  Activities of Daily Living      Permission Sought/Granted Permission sought to share information with : Case Manager,Family Chief Financial Officer Permission granted to share information with : Yes, Verbal Permission Granted  Share Information with NAME: [name]  Permission granted to share info w AGENCY: SNF  Permission granted to share info w Relationship: sister  Permission granted to share info w Contact Information: [name] [phone]  Emotional Assessment Appearance:: Appears stated age Attitude/Demeanor/Rapport: Gracious Affect (typically observed): Calm Orientation: : Oriented to Self,Oriented to Place,Oriented to  Time,Oriented to Situation Alcohol / Substance Use: Not Applicable Psych Involvement: No (comment)  Admission diagnosis:  Shortness of breath [R06.02] Dyspnea [R06.00] Patient Active Problem List   Diagnosis Date Noted  . Mild protein malnutrition ([place]) 10/24/2020  . Class 3 obesity 10/24/2020  . Aortic atherosclerosis ([place]) 10/24/2020  . Dyspnea 10/23/2020  . Acute CHF (congestive heart failure) ([place]) 09/24/2020  . Acute exacerbation of CHF (congestive heart failure) ([place]) 04/16/2020  . Acute on chronic diastolic (congestive) heart failure ([place]) 02/20/2020  . Hypokalemia   . Anxiety   . Physical deconditioning 03/13/2019  . Acute recurrent maxillary sinusitis 03/13/2019  . Pain  due to onychomycosis of toenails of both feet 11/21/2018  . Shortness of breath 08/09/2018  . Asthma 06/19/2018  . Chronic respiratory failure with hypoxia ([place]) 05/15/2018  . Pulmonary hypertension ([place]) 05/14/2018  . Atypical chest pain 05/12/2018  . Community acquired pneumonia  12/13/2017  . CAP (community acquired pneumonia) 12/13/2017  . CHF (congestive heart failure) ([place]) 12/12/2017  . Morbid obesity ([place]) 11/21/2017  . Flu 07/07/2017  . Influenza A 07/05/2017  . Acute on chronic respiratory failure with hypoxia ([place]) 07/05/2017  . Ascites   . Acute on chronic diastolic heart failure ([place]) 11/17/2016  . CKD (chronic kidney disease), stage III ([place]) 11/17/2016  . S/P laparoscopic cholecystectomy 03/02/2015  . Hyperlipidemia 11/25/2012  . Depression 11/25/2012  . GERD (gastroesophageal reflux disease) 11/25/2012  . Postoperative anemia due to acute blood loss 11/01/2012  . Osteoarthritis of right knee 10/31/2012  . Hyperglycemia 11/30/2011  . SBP (spontaneous bacterial peritonitis) ([place]) 11/25/2011    Class: Acute  . Syncope and collapse 11/20/2011  . Abdominal pain 11/19/2011  . Hyponatremia 11/19/2011  . Normocytic anemia 11/19/2011  . Acute kidney injury superimposed on CKD ([place]) 11/19/2011  . Moderate protein-calorie malnutrition ([place]) 11/19/2011  . Essential hypertension 11/19/2011  . Hypothyroidism, acquired 11/19/2011  . Dyslipidemia 11/19/2011  . Diabetes ([place]) 07/15/2011   PCP:  Mayra Neer, MD Pharmacy:   [place], [place] - [address] [place] [address] Phone: [phone] Fax: [phone]     Social Determinants of Health (SDOH) Interventions    Readmission Risk Interventions Readmission Risk Prevention Plan 02/26/2020  Transportation Screening Complete  Medication Review ([place]) Complete  PCP or Specialist appointment within 3-5 days of discharge Complete  SW Recovery Care/Counseling Consult Complete  [place] Patient Refused  Some recent data might be hidden

## 2020-10-26 DIAGNOSIS — K219 Gastro-esophageal reflux disease without esophagitis: Secondary | ICD-10-CM | POA: Diagnosis not present

## 2020-10-26 DIAGNOSIS — E785 Hyperlipidemia, unspecified: Secondary | ICD-10-CM | POA: Diagnosis not present

## 2020-10-26 DIAGNOSIS — J45901 Unspecified asthma with (acute) exacerbation: Secondary | ICD-10-CM | POA: Diagnosis not present

## 2020-10-26 DIAGNOSIS — I10 Essential (primary) hypertension: Secondary | ICD-10-CM | POA: Diagnosis not present

## 2020-10-26 LAB — GLUCOSE, CAPILLARY
Glucose-Capillary: 161 mg/dL — ABNORMAL HIGH (ref 70–99)
Glucose-Capillary: 260 mg/dL — ABNORMAL HIGH (ref 70–99)
Glucose-Capillary: 188 mg/dL — ABNORMAL HIGH (ref 70–99)
Glucose-Capillary: 192 mg/dL — ABNORMAL HIGH (ref 70–99)

## 2020-10-26 MED ORDER — PREDNISONE 20 MG PO TABS
30.0000 mg | ORAL_TABLET | Freq: Every day | ORAL | Status: DC
  Administered 2020-10-27: 30 mg via ORAL
  Filled 2020-10-26: qty 1

## 2020-10-26 MED ORDER — SACCHAROMYCES BOULARDII 250 MG PO CAPS
250.0000 mg | ORAL_CAPSULE | Freq: Two times a day (BID) | ORAL | Status: DC
  Administered 2020-10-26 – 2020-10-27 (×3): 250 mg via ORAL
  Filled 2020-10-26 (×3): qty 1

## 2020-10-26 NOTE — Progress Notes (Signed)
Occupational Therapy Treatment Patient Details Name: Amanda Cain MRN: [id number] DOB: Jun 27, 1945 Today's Date: 10/26/2020    History of present illness 75 y.o. female with history of chronic diastolic heart failure, CKD stage IV, DM-2, HTN, bronchial asthma-who presented with shortness of breath-thought to be due to asthma exacerbation.  Patient was at [facility] 3 weeks prior due to SOB and BLE edema weight gain, and was discharged to SNF for post acute rehab.   OT comments  Patient seated in recliner, agreeable to OT session.  Focused on ADL task and activity tolerance at sink. Completing transfers with min guard for safety, mobility to sink using RW with min guard. With bushing teeth at sink fatigued quickly, requires cueing for safety awareness and need to rest seated.  Pt with poor awareness to safety, problem solving during functional tasks when fatigued, discussed fall prevention and safety.  Patient will need aide assist at home, if able to get aide support for ADLs believe HHOT will be appropriate.  Pt declining SNF at this time.    Follow Up Recommendations  Home health OT;Supervision/Assistance - 24 hour    Equipment Recommendations  Tub/shower bench    Recommendations for Other Services      Precautions / Restrictions Precautions Precautions: Fall Precaution Comments: watch O2 sats Restrictions Weight Bearing Restrictions: No       Mobility Bed Mobility               General bed mobility comments: in recliner upon entry    Transfers Overall transfer level: Needs assistance Equipment used: Rolling walker (2 wheeled) Transfers: Sit to/from Stand Sit to Stand: Min guard Stand pivot transfers: Min guard       General transfer comment: min guard for safety/balance    Balance Overall balance assessment: Needs assistance Sitting-balance support: No upper extremity supported;Feet supported Sitting balance-Leahy Scale: Good     Standing balance support:  Bilateral upper extremity supported;During functional activity;Single extremity supported Standing balance-Leahy Scale: Poor Standing balance comment: relies on RW, able to manage at sink with 1 UE support briefly but leans on sink                           ADL either performed or assessed with clinical judgement   ADL Overall ADL's : Needs assistance/impaired     Grooming: Oral care;Min guard;Standing Grooming Details (indicate cue type and reason): at sink, fatigues quickly and requires min guard for safety                 Toilet Transfer: Ambulation;Min Fish farm manager Details (indicate cue type and reason): simulated in room         Functional mobility during ADLs: Min guard;Rolling walker General ADL Comments: min guard for safety, O2 mgmt; discussed fall prevention and safety     Vision       Perception     Praxis      Cognition Arousal/Alertness: Awake/alert Behavior During Therapy: WFL for tasks assessed/performed Overall Cognitive Status: Impaired/Different from baseline Area of Impairment: Safety/judgement;Awareness;Problem solving;Memory                     Memory: Decreased short-term memory   Safety/Judgement: Decreased awareness of safety;Decreased awareness of deficits Awareness: Emergent Problem Solving: Slow processing;Difficulty sequencing;Requires verbal cues General Comments: pt with STM deficits, decreased awareness of safety and problem sovling.  When getting fatigued at the sink standing, pt unable to figure out what  to do or where to sit down.        Exercises     Shoulder Instructions       General Comments discussed fall prevention and safety, patient reports 'I always brush my teeth standing at sink", when completing this activity pt fatigued quickly and had poor awareness to needing to sit down. Cueing to have pt sit in chair and rest.  When asked what pt would do at home, pt voiced "I would just walk  to the kitchen", reporting the kitchen is right next to the bathroom.  Problem solved safety, having assist with all ADLs and transfers and having chair at sink for safety.    Pertinent Vitals/ Pain       Pain Assessment: No/denies pain  Home Living                                          Prior Functioning/Environment              Frequency  Min 2X/week        Progress Toward Goals  OT Goals(current goals can now be found in the care plan section)  Progress towards OT goals: Progressing toward goals  Acute Rehab OT Goals Patient Stated Goal: I want to go home. OT Goal Formulation: With patient  Plan Discharge plan remains appropriate;Frequency remains appropriate    Co-evaluation                 AM-PAC OT "6 Clicks" Daily Activity     Outcome Measure   Help from another person eating meals?: None Help from another person taking care of personal grooming?: A Little Help from another person toileting, which includes using toliet, bedpan, or urinal?: A Little Help from another person bathing (including washing, rinsing, drying)?: A Little Help from another person to put on and taking off regular upper body clothing?: None Help from another person to put on and taking off regular lower body clothing?: A Little 6 Click Score: 20    End of Session Equipment Utilized During Treatment: Rolling walker;Oxygen  OT Visit Diagnosis: Unsteadiness on feet (R26.81);Muscle weakness (generalized) (M62.81);History of falling (Z91.81)   Activity Tolerance Patient limited by fatigue   Patient Left in chair;with call bell/phone within reach   Nurse Communication Mobility status        Time: 1700-1749 OT Time Calculation (min): 24 min  Charges: OT General Charges $OT Visit: 1 Visit OT Treatments $Self Care/Home Management : 23-37 mins  Jolaine Artist, OT Acute Rehabilitation Services Pager [phone] Office [place] 10/26/2020, 2:51 PM

## 2020-10-26 NOTE — Progress Notes (Signed)
Pt was concerned about going back to [facility], she did not want to go back there, if possible she would like to go home with Home Health instead, thanks [facility] [place].

## 2020-10-26 NOTE — Plan of Care (Signed)
  Problem: Education: Goal: Ability to demonstrate management of disease process will improve Outcome: Progressing Goal: Ability to verbalize understanding of medication therapies will improve Outcome: Progressing   Problem: Activity: Goal: Capacity to carry out activities will improve Outcome: Not Progressing   Problem: Cardiac: Goal: Ability to achieve and maintain adequate cardiopulmonary perfusion will improve Outcome: Progressing  Patient has shown unwillingness / inability to move forward with increased mobility.  States she "just wants to sleep".  Believe she is able to participate more with ADLs, but lacks the effort.  Requires much encouragement and support, especially in lieu of her desire to return home.

## 2020-10-26 NOTE — Progress Notes (Signed)
PROGRESS NOTE        PATIENT DETAILS Name: Amanda Cain Age: 75 y.o. Sex: female Date of Birth: [birth year] Admit Date: 10/23/2020 Admitting Physician No admitting provider for patient encounter. BZJ:IRCV, Kimberlee, MD  Brief Narrative: Patient is a 75 y.o. female with history of chronic diastolic heart failure, CKD stage IV, DM-2, HTN, bronchial asthma-who presented with shortness of breath-thought to be due to asthma exacerbation.  Significant events: 5/27>> admit for evaluation of shortness of breath-thought to be asthma exacerbation.  Significant studies: 5/27>> CT chest: Subsegmental atelectasis in the lingula/left lower lobe 5/27>> right lower extremity Doppler: No DVT.  Antimicrobial therapy: Doxycycline: 5/27>> 5/30 Rocephin: 5/27>> 5/30  Microbiology data: 5/27>> COVID PCR: Negative  Procedures : None  Consults: None  DVT Prophylaxis : enoxaparin (LOVENOX) injection 30 mg Start: 10/25/20 1000   Subjective: Feels better-breathing is much improved.  Understands that she will need SNF on discharge.  She talked with case management yesterday.   Assessment/Plan: Asthma exacerbation: Improved-hardly any wheezing-continue tapering oral prednisone-continue bronchodilators.  Do not think she requires further antimicrobial therapy-stop Rocephin/Doxy on 5/30.    Chronic diastolic heart failure: Volume status appears stable-on Demadex.   Hyponatremia: Resolved  CKD stage IV: Creatinine close to baseline-avoid nephrotoxic agents and follow closely.  Needs outpatient follow-up with nephrology.  HTN: Controlled-remains on hydralazine/Toprol/Cardizem.   HLD: Continue statin/Zetia  DM-2 (A1c 6.6 on 2/3): Continue Lantus 10 units-add SSI-follow CBGs  Recent Labs    10/25/20 2046 10/26/20 0527 10/26/20 1039  GLUCAP 157* 161* 260*    GERD: Continue PPI  Hypothyroidism: Continue Synthroid  Depression/trichotillomania: Appears  stable-continue Invega, lorazepam, Lexapro  Debility/deconditioning: Worsened by acute illness-she was inquiring whether she could go home-I asked case management today to evaluate her-if she could go home with maximal home health services-after case management evaluation-it is felt that she is still appropriate for SNF.  Morbid Obesity: Estimated body mass index is 45.28 kg/m as calculated from the following:   Height as of this encounter: 4\' 11"  (1.499 m).   Weight as of this encounter: 101.7 kg.    Diet: Diet Order            DIET SOFT Room service appropriate? Yes; Fluid consistency: Thin  Diet effective now                  Code Status: Full code o  Family Communication: Sister-Geraldine [phone]-called at patient's request-unable to reach her-voicemail not set up  Disposition Plan: Status is: Inpatient  The patient will require care spanning > 2 midnights and should be moved to inpatient because: Inpatient level of care appropriate due to severity of illness  Dispo: The patient is from: SNF              Anticipated d/c is to: TBD              Patient currently is not medically stable to d/c.   Difficult to place patient No    Barriers to Discharge: Awaiting SNF  Antimicrobial agents: Anti-infectives (From admission, onward)   Start     Dose/Rate Route Frequency Ordered Stop   10/24/20 2200  cefTRIAXone (ROCEPHIN) 2 g in sodium chloride 0.9 % 100 mL IVPB  Status:  Discontinued        2 g 200 mL/hr over 30 Minutes Intravenous Every 24 hours 10/24/20  3299 10/24/20 0644   10/24/20 2200  cefTRIAXone (ROCEPHIN) 1 g in sodium chloride 0.9 % 100 mL IVPB        1 g 200 mL/hr over 30 Minutes Intravenous Every 24 hours 10/24/20 0644     10/24/20 0015  doxycycline (VIBRA-TABS) tablet 100 mg       Note to Pharmacy: Patient taking differently: Take 100 mg by mouth two times a day/Start date: 10/20/2020 and End date: 10/27/2020     100 mg Oral 2 times daily  10/23/20 2344 10/28/20 0959   10/24/20 0000  cefTRIAXone (ROCEPHIN) 2 g in sodium chloride 0.9 % 100 mL IVPB  Status:  Discontinued        2 g 200 mL/hr over 30 Minutes Intravenous Every 24 hours 10/23/20 2357 10/23/20 2357   10/23/20 2345  cefTRIAXone (ROCEPHIN) 1 g in sodium chloride 0.9 % 100 mL IVPB  Status:  Discontinued        1 g 200 mL/hr over 30 Minutes Intravenous Every 24 hours 10/23/20 2344 10/24/20 0643       Time spent: 25 minutes-Greater than 50% of this time was spent in counseling, explanation of diagnosis, planning of further management, and coordination of care.  MEDICATIONS: Scheduled Meds: . atorvastatin  80 mg Oral QHS  . azelastine  1 spray Each Nare BID  . diclofenac Sodium  2-4 g Topical q AM  . diltiazem  360 mg Oral Daily  . doxycycline  100 mg Oral BID  . enoxaparin (LOVENOX) injection  30 mg Subcutaneous Q24H  . escitalopram  20 mg Oral QHS  . ezetimibe  10 mg Oral Daily  . feeding supplement (GLUCERNA SHAKE)  237 mL Oral TID BM  . ferrous gluconate  324 mg Oral Q M,W,F  . fluticasone furoate-vilanterol  1 puff Inhalation q AM  . gabapentin  100 mg Oral BID  . hydrALAZINE  25 mg Oral Q8H  . insulin aspart  0-6 Units Subcutaneous TID WC  . insulin glargine  10 Units Subcutaneous Daily  . ipratropium-albuterol  3 mL Nebulization BID  . levothyroxine  200 mcg Oral Q0600  . LORazepam  0.25 mg Oral BID  . LORazepam  0.5 mg Oral QHS  . metoprolol succinate  50 mg Oral q morning  . montelukast  10 mg Oral QHS  . paliperidone  3 mg Oral QHS  . pantoprazole  40 mg Oral QAC breakfast  . polyvinyl alcohol  1 drop Both Eyes BID  . predniSONE  40 mg Oral Q breakfast  . torsemide  40 mg Oral q AM   Continuous Infusions: . cefTRIAXone (ROCEPHIN)  IV 1 g (10/25/20 2125)   PRN Meds:.albuterol, benzonatate, ondansetron **OR** ondansetron (ZOFRAN) IV   PHYSICAL EXAM: Vital signs: Vitals:   10/25/20 1958 10/25/20 2025 10/26/20 0525 10/26/20 1038  BP: (!)  164/65 (!) 164/65 (!) 160/71   Pulse: 64 63 62   Resp: 16 (!) 21 16   Temp: 98 F (36.7 C)  98.1 F (36.7 C) 98 F (36.7 C)  TempSrc: Oral  Oral Oral  SpO2: 98% 100% 99%   Weight:   101.7 kg   Height:       Filed Weights   10/23/20 1409 10/25/20 0700 10/26/20 0525  Weight: 95.3 kg 95.1 kg 101.7 kg   Body mass index is 45.28 kg/m.   Gen Exam:Alert awake-not in any distress HEENT:atraumatic, normocephalic Chest: B/L clear to auscultation anteriorly CVS:S1S2 regular Abdomen:soft non tender, non distended Extremities:no edema  Neurology: Non focal Skin: no rash  I have personally reviewed following labs and imaging studies  LABORATORY DATA: CBC: Recent Labs  Lab 10/23/20 1511 10/24/20 0225 10/25/20 0621  WBC 7.0 7.2 6.9  NEUTROABS 5.3  --   --   HGB 9.0* 9.7* 9.1*  HCT 29.1* 31.1* 28.8*  MCV 89.5 89.1 87.8  PLT 281 270 401    Basic Metabolic Panel: Recent Labs  Lab 10/23/20 1511 10/24/20 0225 10/25/20 0621  NA 131* 137 135  K 3.9 3.5 4.3  CL 94* 97* 100  CO2 27 28 28   GLUCOSE 92 108* 179*  BUN 46* 46* 58*  CREATININE 3.34* 3.43* 3.45*  CALCIUM 8.6* 9.2 8.9    GFR: Estimated Creatinine Clearance: 14.6 mL/min (A) (by C-G formula based on SCr of 3.45 mg/dL (H)).  Liver Function Tests: Recent Labs  Lab 10/23/20 1511 10/24/20 0225  AST 19 17  ALT 15 17  ALKPHOS 58 65  BILITOT 1.0 0.4  PROT 5.8* 6.2*  ALBUMIN 3.2* 3.3*   No results for input(s): LIPASE, AMYLASE in the last 168 hours. No results for input(s): AMMONIA in the last 168 hours.  Coagulation Profile: No results for input(s): INR, PROTIME in the last 168 hours.  Cardiac Enzymes: No results for input(s): CKTOTAL, CKMB, CKMBINDEX, TROPONINI in the last 168 hours.  BNP (last 3 results) No results for input(s): PROBNP in the last 8760 hours.  Lipid Profile: No results for input(s): CHOL, HDL, LDLCALC, TRIG, CHOLHDL, LDLDIRECT in the last 72 hours.  Thyroid Function Tests: No  results for input(s): TSH, T4TOTAL, FREET4, T3FREE, THYROIDAB in the last 72 hours.  Anemia Panel: No results for input(s): VITAMINB12, FOLATE, FERRITIN, TIBC, IRON, RETICCTPCT in the last 72 hours.  Urine analysis:    Component Value Date/Time   COLORURINE YELLOW 09/29/2020 0543   APPEARANCEUR HAZY (A) 09/29/2020 0543   LABSPEC 1.010 09/29/2020 0543   PHURINE 5.0 09/29/2020 0543   GLUCOSEU NEGATIVE 09/29/2020 0543   HGBUR NEGATIVE 09/29/2020 0543   BILIRUBINUR NEGATIVE 09/29/2020 0543   KETONESUR NEGATIVE 09/29/2020 0543   PROTEINUR 100 (A) 09/29/2020 0543   UROBILINOGEN 0.2 10/26/2012 0854   NITRITE NEGATIVE 09/29/2020 0543   LEUKOCYTESUR NEGATIVE 09/29/2020 0543    Sepsis Labs: Lactic Acid, Venous    Component Value Date/Time   LATICACIDVEN 1.0 11/21/2011 1200    MICROBIOLOGY: Recent Results (from the past 240 hour(s))  SARS CORONAVIRUS 2 (TAT 6-24 HRS) Nasopharyngeal Nasopharyngeal Swab     Status: None   Collection Time: 10/23/20  7:47 PM   Specimen: Nasopharyngeal Swab  Result Value Ref Range Status   SARS Coronavirus 2 NEGATIVE NEGATIVE Final    Comment: (NOTE) SARS-CoV-2 target nucleic acids are NOT DETECTED.  The SARS-CoV-2 RNA is generally detectable in upper and lower respiratory specimens during the acute phase of infection. Negative results do not preclude SARS-CoV-2 infection, do not rule out co-infections with other pathogens, and should not be used as the sole basis for treatment or other patient management decisions. Negative results must be combined with clinical observations, patient history, and epidemiological information. The expected result is Negative.  Fact Sheet for Patients: SugarRoll.be  Fact Sheet for Healthcare Providers: https://www.woods-mathews.com/  This test is not yet approved or cleared by the [place] FDA and  has been authorized for detection and/or diagnosis of SARS-CoV-2  by FDA under an Emergency Use Authorization (EUA). This EUA will remain  in effect (meaning this test can be used) for the duration of  the COVID-19 declaration under Se ction 564(b)(1) of the Act, 21 U.S.C. section 360bbb-3(b)(1), unless the authorization is terminated or revoked sooner.  Performed at [facility], [place] [address], [place], [place] [address]     RADIOLOGY STUDIES/RESULTS: DG Chest 1 View  Result Date: 10/25/2020 CLINICAL DATA:  Shortness of breath EXAM: CHEST  1 VIEW COMPARISON:  10/23/2020 FINDINGS: Cardiac shadow is enlarged. Vascular congestion has improved significantly. No focal infiltrate or effusion is noted. No acute bony abnormality is noted. IMPRESSION: Mild vascular congestion without edema. Electronically Signed   By: Inez Catalina M.D.   On: 10/25/2020 20:26     LOS: 2 days   Oren Binet, MD  [facility]    To contact the attending provider between 7A-7P or the covering provider during after hours 7P-7A, please log into the web site [email or web address] and access using universal [place] password for that web site. If you do not have the password, please call the hospital operator.  10/26/2020, 10:47 AM

## 2020-10-26 NOTE — Progress Notes (Signed)
Physical Therapy Treatment Patient Details Name: Amanda Cain MRN: [id number] DOB: Nov 09, 1945 Today's Date: 10/26/2020    History of Present Illness 75 y.o. female with history of chronic diastolic heart failure, CKD stage IV, DM-2, HTN, bronchial asthma-who presented with shortness of breath-thought to be due to asthma exacerbation.  Patient was at [facility] 3 weeks prior due to SOB and BLE edema weight gain, and was discharged to SNF for post acute rehab.    PT Comments    Pt received in chair, ambulated 35' with RW and min-guard A. O2 sats remained in upper 90's on 2L O2. Pt tolerated standing for longer period of time today as well as ambulating into bathroom and washing hands at sink afterwards.  Doing better with backing up and turning but knees get buckly as she fatigues. Pt reports that she feels better than her baseline before she went [place]. Pt does not want to go to SNF and had finished her course of therapy there. Recommend return home with resumption of HH aide and HHPT/ OT. PT will continue to follow.    Follow Up Recommendations  Home health PT;Supervision for mobility/OOB     Equipment Recommendations  Other (comment) (O2)    Recommendations for Other Services       Precautions / Restrictions Precautions Precautions: Fall Precaution Comments: watch O2 sats Restrictions Weight Bearing Restrictions: No    Mobility  Bed Mobility               General bed mobility comments: pt's NT notes that pt has needed 2 person assist to come to EOB. Pf note, pt sleeps in recliner at home and does not perform bed mobility    Transfers Overall transfer level: Needs assistance Equipment used: Rolling walker (2 wheeled) Transfers: Sit to/from Stand Sit to Stand: Supervision Stand pivot transfers: Min guard       General transfer comment: pt stepping feet better today with turning compared to 5/28 but continues to have difficulty when  fatigued  Ambulation/Gait Ambulation/Gait assistance: Min guard Gait Distance (Feet): 35 Feet Assistive device: Rolling walker (2 wheeled) Gait Pattern/deviations: Step-through pattern;Decreased stride length Gait velocity: decreased Gait velocity interpretation: <1.31 ft/sec, indicative of household ambulator General Gait Details: pt remained on 2L O2 with ambulation today and sats remained in 90's. Knees mildly buckling with last 3' ambulation.   Stairs             Wheelchair Mobility    Modified [place] (Stroke Patients Only)       Balance Overall balance assessment: Needs assistance Sitting-balance support: No upper extremity supported;Feet supported Sitting balance-Leahy Scale: Good     Standing balance support: Bilateral upper extremity supported Standing balance-Leahy Scale: Poor Standing balance comment: relies on RW                            Cognition Arousal/Alertness: Awake/alert Behavior During Therapy: WFL for tasks assessed/performed Overall Cognitive Status: Within Functional Limits for tasks assessed                                 General Comments: ST memory deficts and mild declines to complex thought noted.      Exercises      General Comments General comments (skin integrity, edema, etc.): worked on maintaining standing longer with washing hands at sink and moving to different chairs in room. Bari Recliner is  too big for her. Small chair with arms in room is good size but does not recliner so pt did not stay in that chair. Returned to recliner      Pertinent Vitals/Pain Pain Assessment: No/denies pain    Home Living                      Prior Function            PT Goals (current goals can now be found in the care plan section) Acute Rehab PT Goals Patient Stated Goal: I want to go home. PT Goal Formulation: With patient Time For Goal Achievement: 11/07/20 Potential to Achieve Goals:  Good Progress towards PT goals: Progressing toward goals    Frequency    Min 3X/week      PT Plan Current plan remains appropriate    Co-evaluation              AM-PAC PT "6 Clicks" Mobility   Outcome Measure  Help needed turning from your back to your side while in a flat bed without using bedrails?: A Little Help needed moving from lying on your back to sitting on the side of a flat bed without using bedrails?: A Little Help needed moving to and from a bed to a chair (including a wheelchair)?: A Little Help needed standing up from a chair using your arms (e.g., wheelchair or bedside chair)?: A Little Help needed to walk in hospital room?: A Little Help needed climbing 3-5 steps with a railing? : A Lot 6 Click Score: 17    End of Session Equipment Utilized During Treatment: Oxygen;Gait belt Activity Tolerance: Patient tolerated treatment well Patient left: in chair;with call bell/phone within reach Nurse Communication: Mobility status PT Visit Diagnosis: Muscle weakness (generalized) (M62.81);Difficulty in walking, not elsewhere classified (R26.2);History of falling (Z91.81)     Time: 8889-1694 PT Time Calculation (min) (ACUTE ONLY): 46 min  Charges:  $Gait Training: 23-37 mins $Therapeutic Activity: 8-22 mins                     Leighton Roach, PT  Acute Rehab Services  Pager [phone] Office [place] 10/26/2020, 1:39 PM

## 2020-10-26 NOTE — TOC Progression Note (Signed)
Transition of Care [facility]) - Progression Note    Patient Details  Name: Amanda Cain MRN: [id number] Date of Birth: 1946/04/10  [facility] of [facility] CM/SW Contact  Reece Agar, [place] Phone Number: 10/26/2020, 1:18 PM  Clinical Narrative:    1215: CSW spoke with pt about SNF [place], pt stated she does not want to go back to [place] and really does not want to go to SNF at all bc she does not want them to take her whole check bc of her co-pays. CSW will fax pt out to there SNF and discussed 24 hour care with pt, she stated she had 5 hour 7 day care before previous hospital visit but no longer has services since being in SNF at [place]. CSW will follow up with pt once facilities accept offer.    Expected Discharge Plan: Skilled Nursing Facility Barriers to Discharge: Continued Medical Work up  Expected Discharge Plan and Services Expected Discharge Plan: [place] In-house Referral: Clinical Social Work     Living arrangements for the past 2 months: [place] (was recently at [facility] but lives at home alone)                                       Social Determinants of Health ([place]) Interventions    Readmission Risk Interventions Readmission Risk Prevention Plan 02/26/2020  Transportation Screening Complete  Medication Review Press photographer) Complete  PCP or Specialist appointment within 3-5 days of discharge Complete  SW Recovery Care/Counseling Consult Complete  [place] Patient Refused  Some recent data might be hidden

## 2020-10-27 DIAGNOSIS — I700 Atherosclerosis of aorta: Secondary | ICD-10-CM

## 2020-10-27 DIAGNOSIS — F419 Anxiety disorder, unspecified: Secondary | ICD-10-CM

## 2020-10-27 DIAGNOSIS — J9621 Acute and chronic respiratory failure with hypoxia: Secondary | ICD-10-CM | POA: Diagnosis not present

## 2020-10-27 DIAGNOSIS — E785 Hyperlipidemia, unspecified: Secondary | ICD-10-CM | POA: Diagnosis not present

## 2020-10-27 LAB — GLUCOSE, CAPILLARY
Glucose-Capillary: 132 mg/dL — ABNORMAL HIGH (ref 70–99)
Glucose-Capillary: 263 mg/dL — ABNORMAL HIGH (ref 70–99)
Glucose-Capillary: 250 mg/dL — ABNORMAL HIGH (ref 70–99)
Glucose-Capillary: 287 mg/dL — ABNORMAL HIGH (ref 70–99)

## 2020-10-27 LAB — RESP PANEL BY RT-PCR (FLU A&B, COVID) ARPGX2
Influenza A by PCR: NEGATIVE
Influenza B by PCR: NEGATIVE
SARS Coronavirus 2 by RT PCR: NEGATIVE

## 2020-10-27 MED ORDER — INSULIN ASPART 100 UNIT/ML IJ SOLN: 0.0000 [IU] | Freq: Three times a day (TID) | INTRAMUSCULAR | 11 refills | Status: DC

## 2020-10-27 MED ORDER — LORAZEPAM 0.5 MG PO TABS: 0.2500 mg | ORAL_TABLET | ORAL | 0 refills | Status: AC

## 2020-10-27 MED ORDER — IPRATROPIUM-ALBUTEROL 0.5-2.5 (3) MG/3ML IN SOLN: 3.0000 mL | Freq: Three times a day (TID) | RESPIRATORY_TRACT | 0 refills | Status: DC

## 2020-10-27 MED ORDER — BENZONATATE 100 MG PO CAPS
100.0000 mg | ORAL_CAPSULE | Freq: Three times a day (TID) | ORAL | 0 refills | Status: AC | PRN
Start: 2020-10-27 — End: ?

## 2020-10-27 MED ORDER — ALBUTEROL SULFATE (2.5 MG/3ML) 0.083% IN NEBU: 2.5000 mg | INHALATION_SOLUTION | RESPIRATORY_TRACT | 12 refills | Status: AC | PRN

## 2020-10-27 MED ORDER — IPRATROPIUM-ALBUTEROL 0.5-2.5 (3) MG/3ML IN SOLN: 3.0000 mL | Freq: Three times a day (TID) | RESPIRATORY_TRACT | 0 refills | Status: AC

## 2020-10-27 MED ORDER — GLUCERNA SHAKE PO LIQD: 237.0000 mL | Freq: Three times a day (TID) | ORAL | 0 refills | Status: DC

## 2020-10-27 MED ORDER — PREDNISONE 10 MG PO TABS: ORAL_TABLET | ORAL | 0 refills | Status: DC

## 2020-10-27 NOTE — Discharge Summary (Signed)
PATIENT DETAILS Name: Amanda Cain Age: 75 y.o. Sex: female Date of Birth: 1946/01/26 MRN: [id number]. Admitting Physician: No admitting provider for patient encounter. [id number], Nathen May, MD  Admit Date: 10/23/2020 Discharge date: 10/27/2020  Recommendations for Outpatient Follow-up:  1. Follow up with PCP in 1-2 weeks 2. Please obtain CMP/CBC in one week  Admitted From:  SNF  Disposition: SNF   Home Health: No  Equipment/Devices: None  Discharge Condition: Stable  CODE STATUS: FULL CODE  Diet recommendation:  Diet Order            Diet - low sodium heart healthy           Diet Carb Modified           DIET SOFT Room service appropriate? Yes; Fluid consistency: Thin  Diet effective now                 Brief Narrative: Patient is a 75 y.o. female with history of chronic diastolic heart failure, CKD stage IV, DM-2, HTN, bronchial asthma-who presented with shortness of breath-thought to be due to asthma exacerbation.  Significant events: 5/27>> admit for evaluation of shortness of breath-thought to be asthma exacerbation.  Significant studies: 5/27>> CT chest: Subsegmental atelectasis in the lingula/left lower lobe 5/27>> right lower extremity Doppler: No DVT.  Antimicrobial therapy: Doxycycline: 5/27>> 5/30 Rocephin: 5/27>> 5/30  Microbiology data: 5/27>> COVID PCR: Negative  Procedures : None  Consults: None   Brief Hospital Course: Asthma exacerbation: Overall much improved-no wheezing-treated with steroids/bronchodilators.  Antimicrobial therapy was discontinued on 5/30.  Continue bronchodilators on discharge-continue tapering steroids for a few more days.   Chronic diastolic heart failure: Volume status appears stable-on Demadex.   Hyponatremia: Resolved  CKD stage IV: Creatinine close to baseline-avoid nephrotoxic agents and follow closely.  Needs outpatient follow-up with nephrology (per patient she follows with Dr.  Carolin Sicks).  HTN: Controlled-remains on hydralazine/Toprol/Cardizem.   HLD: Continue statin/Zetia  DM-2 (A1c 6.6 on 2/3): Continue Lantus 10 units-add SSI-follow CBGs  GERD: Continue PPI  Hypothyroidism: Continue Synthroid  Depression/trichotillomania: Appears stable-continue Invega, lorazepam, Lexapro  Debility/deconditioning: Worsened by acute illness-she was inquiring whether she could go home-I asked case management today to evaluate her-if she could go home with maximal home health services-after case management evaluation-it is felt that she is still appropriate for SNF.  Morbid Obesity: Estimated body mass index is 45.28 kg/m as calculated from the following:   Height as of this encounter: 4\' 11"  (1.499 m).   Weight as of this encounter: 101.7 kg.     Discharge Diagnoses:  Principal Problem:   Dyspnea Active Problems:   Hyponatremia   Normocytic anemia   Essential hypertension   Hypothyroidism, acquired   Dyslipidemia   Depression   GERD (gastroesophageal reflux disease)   Acute on chronic respiratory failure with hypoxia (HCC)   CAP (community acquired pneumonia)   Anxiety   Mild protein malnutrition ([place])   Class 3 obesity   Aortic atherosclerosis ([place])   Discharge Instructions:  Activity:  As tolerated with Full fall precautions use walker/cane & assistance as needed   Discharge Instructions    Call MD for:  difficulty breathing, headache or visual disturbances   Complete by: As directed    Diet - low sodium heart healthy   Complete by: As directed    Diet Carb Modified   Complete by: As directed    Discharge instructions   Complete by: As directed    Follow with Primary MD  Brigitte Pulse,  Nathen May, MD in 1-2 weeks  Please get a complete blood count and chemistry panel checked by your Primary MD at your next visit, and again as instructed by your Primary MD.  Get Medicines reviewed and adjusted: Please take all your medications with you for your  next visit with your Primary MD  Laboratory/radiological data: Please request your Primary MD to go over all hospital tests and procedure/radiological results at the follow up, please ask your Primary MD to get all Hospital records sent to his/her office.  In some cases, they will be blood work, cultures and biopsy results pending at the time of your discharge. Please request that your primary care M.D. follows up on these results.  Also Note the following: If you experience worsening of your admission symptoms, develop shortness of breath, life threatening emergency, suicidal or homicidal thoughts you must seek medical attention immediately by calling 911 or calling your MD immediately  if symptoms less severe.  You must read complete instructions/literature along with all the possible adverse reactions/side effects for all the Medicines you take and that have been prescribed to you. Take any new Medicines after you have completely understood and accpet all the possible adverse reactions/side effects.   Do not drive when taking Pain medications or sleeping medications (Benzodaizepines)  Do not take more than prescribed Pain, Sleep and Anxiety Medications. It is not advisable to combine anxiety,sleep and pain medications without talking with your primary care practitioner  Special Instructions: If you have smoked or chewed Tobacco  in the last 2 yrs please stop smoking, stop any regular Alcohol  and or any Recreational drug use.  Wear Seat belts while driving.  Please note: You were cared for by a hospitalist during your hospital stay. Once you are discharged, your primary care physician will handle any further medical issues. Please note that NO REFILLS for any discharge medications will be authorized once you are discharged, as it is imperative that you return to your primary care physician (or establish a relationship with a primary care physician if you do not have one) for your post  hospital discharge needs so that they can reassess your need for medications and monitor your lab values.   Check CBGs before meals and at bedtime   Increase activity slowly   Complete by: As directed      Allergies as of 10/27/2020      Reactions   Ace Inhibitors Other (See Comments), Cough   "Allergic," per MAR   Amlodipine Other (See Comments)   "Dizziness," per patient AND "Allergic," per the [facility]   Aspirin Other (See Comments)   "I have an ulcer," per the patient AND flagged as "Allergic," per the [facility]      Medication List    STOP taking these medications   cefTRIAXone 1 g injection Commonly known as: ROCEPHIN   doxycycline 100 MG tablet Commonly known as: VIBRA-TABS   Ginger Root 550 MG Caps   guaiFENesin 600 MG 12 hr tablet Commonly known as: MUCINEX   multivitamin with minerals Tabs tablet   Polyethyl Glycol-Propyl Glycol 0.4-0.3 % Soln   saccharomyces boulardii 250 MG capsule Commonly known as: FLORASTOR     TAKE these medications   acetaminophen 500 MG tablet Commonly known as: TYLENOL Take 500 mg by mouth in the morning.   albuterol 108 (90 Base) MCG/ACT inhaler Commonly known as: VENTOLIN HFA Inhale 2 puffs into the lungs every 4 (four) hours as needed for wheezing or shortness of breath. What  changed: Another medication with the same name was added. Make sure you understand how and when to take each.   albuterol (2.5 MG/3ML) 0.083% nebulizer solution Commonly known as: PROVENTIL Take 3 mLs (2.5 mg total) by nebulization every 4 (four) hours as needed for wheezing or shortness of breath. What changed: You were already taking a medication with the same name, and this prescription was added. Make sure you understand how and when to take each.   atorvastatin 80 MG tablet Commonly known as: LIPITOR Take 80 mg by mouth at bedtime.   azelastine 0.1 % nasal spray Commonly known as: ASTELIN Place 1 spray into both nostrils 2 (two) times daily.    benzonatate 100 MG capsule Commonly known as: TESSALON Take 1 capsule (100 mg total) by mouth 3 (three) times daily as needed for cough. What changed:   when to take this  reasons to take this   Biotin 1000 MCG tablet Take 1,000 mcg by mouth daily.   Breo Ellipta 200-25 MCG/INH Aepb Generic drug: fluticasone furoate-vilanterol Inhale 1 puff into the lungs daily. INHALE 1 PUFF INTO THE LUNGS DAILY What changed:   when to take this  additional instructions   diltiazem 360 MG 24 hr capsule Commonly known as: CARDIZEM CD Take 360 mg by mouth daily.   escitalopram 20 MG tablet Commonly known as: LEXAPRO Take 20 mg by mouth at bedtime.   ezetimibe 10 MG tablet Commonly known as: ZETIA Take 10 mg by mouth daily.   feeding supplement (GLUCERNA SHAKE) Liqd Take 237 mLs by mouth 3 (three) times daily between meals.   ferrous gluconate 324 MG tablet Commonly known as: FERGON Take 324 mg by mouth every Monday, Wednesday, and Friday.   gabapentin 100 MG capsule Commonly known as: NEURONTIN Take 100 mg by mouth 2 (two) times daily.   hydrALAZINE 25 MG tablet Commonly known as: APRESOLINE Take 1 tablet (25 mg total) by mouth every 8 (eight) hours.   insulin aspart 100 UNIT/ML injection Commonly known as: novoLOG Inject 0-6 Units into the skin 3 (three) times daily with meals. 0-6 Units, Subcutaneous, 3 times daily with meals CBG < 70: Implement Hypoglycemia measures CBG 70 - 120: 0 units CBG 121 - 150: 0 units CBG 151 - 200: 1 unit CBG 201-250: 2 units CBG 251-300: 3 units CBG 301-350: 4 units CBG 351-400: 5 units CBG > 400: Give 6 units and call MD   ipratropium-albuterol 0.5-2.5 (3) MG/3ML Soln Commonly known as: DUONEB Take 3 mLs by nebulization every 8 (eight) hours.   levothyroxine 200 MCG tablet Commonly known as: SYNTHROID Take 200 mcg by mouth daily before breakfast.   LORazepam 0.5 MG tablet Commonly known as: ATIVAN Take 0.5-1 tablets (0.25-0.5 mg  total) by mouth See admin instructions. Take 0.25 mg by mouth two times a day and 0.5 mg at bedtime   METAMUCIL FIBER PO Take 3.4 g by mouth daily.   metoprolol succinate 50 MG 24 hr tablet Commonly known as: TOPROL-XL Take 1 tablet (50 mg total) by mouth every morning. Take with or immediately following a meal.   montelukast 10 MG tablet Commonly known as: SINGULAIR Take 1 tablet (10 mg total) by mouth at bedtime. TAKE 1 TABLET(10 MG) BY MOUTH AT BEDTIME What changed: additional instructions   Multiple Vitamin-Folic Acid Tabs Take 1 tablet by mouth daily.   OneTouch Verio test strip Generic drug: glucose blood 1 each by Other route 2 (two) times daily. and lancets 2/day   paliperidone  3 MG 24 hr tablet Commonly known as: INVEGA Take 3 mg by mouth at bedtime.   pantoprazole 40 MG tablet Commonly known as: PROTONIX TAKE 1 TABLET(40 MG) BY MOUTH DAILY What changed: See the new instructions.   polyvinyl alcohol 1.4 % ophthalmic solution Commonly known as: LIQUIFILM TEARS Place 1 drop into both eyes 2 (two) times daily.   predniSONE 10 MG tablet Commonly known as: DELTASONE Take  20 mg daily for 1 days,10 mg daily for 1 day, then stop   PROBIOTIC PO Take 1 capsule by mouth 2 (two) times daily.   sucralfate 1 g tablet Commonly known as: CARAFATE Take 1 g by mouth daily.   torsemide 20 MG tablet Commonly known as: DEMADEX Take 40 mg by mouth in the morning. What changed: Another medication with the same name was removed. Continue taking this medication, and follow the directions you see here.   Tyler Aas FlexTouch 100 UNIT/ML FlexTouch Pen Generic drug: insulin degludec Inject 10 Units into the skin daily.   vitamin B-12 1000 MCG tablet Commonly known as: CYANOCOBALAMIN Take 1,000 mcg by mouth daily.   vitamin C 500 MG tablet Commonly known as: ASCORBIC ACID Take 500 mg by mouth daily.   Vitamin D3 25 MCG (1000 UT) Caps Take 1,000 Units by mouth daily. What  changed: Another medication with the same name was removed. Continue taking this medication, and follow the directions you see here.   Voltaren 1 % Gel Generic drug: diclofenac Sodium Apply 2-4 g topically See admin instructions. Apply 2-4 grams to the left shoulder every morning       Follow-up Information    Mayra Neer, MD. Schedule an appointment as soon as possible for a visit in 1 week(s).   Specialty: Family Medicine Contact information: [address] [place] [address] [phone]        Jettie Booze, MD Follow up in 1 month(s).   Specialties: Cardiology, Radiology, Interventional Cardiology Contact information: [address] [place] [address] [phone]        Rosita Fire, MD. Schedule an appointment as soon as possible for a visit in 2 week(s).   Specialties: Nephrology, Internal Medicine Contact information: [address] [place] [address] [phone]              Allergies  Allergen Reactions  . Ace Inhibitors Other (See Comments) and Cough    "Allergic," per MAR  . Amlodipine Other (See Comments)    "Dizziness," per patient AND "Allergic," per the [facility]  . Aspirin Other (See Comments)    "I have an ulcer," per the patient AND flagged as "Allergic," per the [facility]    Other Procedures/Studies: DG Chest 1 View  Result Date: 10/25/2020 CLINICAL DATA:  Shortness of breath EXAM: CHEST  1 VIEW COMPARISON:  10/23/2020 FINDINGS: Cardiac shadow is enlarged. Vascular congestion has improved significantly. No focal infiltrate or effusion is noted. No acute bony abnormality is noted. IMPRESSION: Mild vascular congestion without edema. Electronically Signed   By: Inez Catalina M.D.   On: 10/25/2020 20:26   CT Chest Wo Contrast  Result Date: 10/23/2020 CLINICAL DATA:  Respiratory illness, nondiagnostic xray Shortness of breath. EXAM: CT CHEST WITHOUT CONTRAST TECHNIQUE: Multidetector CT imaging of the  chest was performed following the standard protocol without IV contrast. COMPARISON:  Radiograph earlier today. FINDINGS: Cardiovascular: Aortic atherosclerosis. No aortic aneurysm. Coronary artery calcifications versus stents. Mitral annulus calcifications. Mild cardiomegaly. No pericardial effusion. Mediastinum/Nodes:  No enlarged mediastinal lymph nodes. No obvious hilar adenopathy on this noncontrast exam. No axillary adenopathy. No esophageal wall thickening. Right thyroidectomy. Lungs/Pleura: Subsegmental linear atelectasis in the lingula and left lower lobe. Mild heterogeneous pulmonary parenchyma. No confluent consolidation. No pleural effusion. The trachea and central bronchi are patent. No septal thickening. Upper Abdomen: No acute upper abdominal findings. Musculoskeletal: Thoracic spondylosis with endplate spurring. There are no acute or suspicious osseous abnormalities. IMPRESSION: 1. Mild heterogeneous pulmonary parenchyma, can be seen with small vessel or small airways disease. 2. Subsegmental linear atelectasis in the lingula and left lower lobe. 3. Mild cardiomegaly. Coronary artery calcifications versus stents. Aortic Atherosclerosis (ICD10-I70.0). Electronically Signed   By: Keith Rake M.D.   On: 10/23/2020 19:06   DG Chest Port 1 View  Result Date: 10/23/2020 CLINICAL DATA:  Shortness of breath EXAM: PORTABLE CHEST 1 VIEW COMPARISON:  09/24/2020 FINDINGS: Cardiac shadow is enlarged but stable. Lungs are well aerated bilaterally. Mild central vascular prominence is noted without edema. No bony abnormality is seen. IMPRESSION: Mild central vascular prominence without parenchymal edema. Electronically Signed   By: Inez Catalina M.D.   On: 10/23/2020 15:37   VAS [place] LOWER EXTREMITY VENOUS (DVT) (ONLY [facility] & [facility] 7a-7p)  Result Date: 10/24/2020  Lower Venous DVT Study Patient Name:  MOREEN PIGGOTT  Date of Exam:   10/23/2020 Medical Rec #: [id number]           Accession #:    [id number] Date  of Birth: [birth year]            Patient Gender: F Patient Age:   74Y Exam Location:  [facility] Procedure:      VAS [place] LOWER EXTREMITY VENOUS (DVT) Referring Phys: [id number] ANKIT NANAVATI --------------------------------------------------------------------------------  Indications: Edema.  Limitations: Body habitus, poor ultrasound/tissue interface and restricted mobility. Comparison Study: No prior study Performing Technologist: Maudry Mayhew MHA, RDMS, RVT, RDCS  Examination Guidelines: A complete evaluation includes B-mode imaging, spectral Doppler, color Doppler, and power Doppler as needed of all accessible portions of each vessel. Bilateral testing is considered an integral part of a complete examination. Limited examinations for reoccurring indications may be performed as noted. The reflux portion of the exam is performed with the patient in reverse Trendelenburg.  +-------+---------------+---------+-----------+----------+--------------+ RIGHT  CompressibilityPhasicitySpontaneityPropertiesThrombus Aging +-------+---------------+---------+-----------+----------+--------------+ CFV    Full           Yes      Yes                                 +-------+---------------+---------+-----------+----------+--------------+ SFJ    Full                                                        +-------+---------------+---------+-----------+----------+--------------+ FV ProxFull                                                        +-------+---------------+---------+-----------+----------+--------------+ FV Mid Full                                                        +-------+---------------+---------+-----------+----------+--------------+  PFV    Full                                                        +-------+---------------+---------+-----------+----------+--------------+ POP    Full           Yes      Yes                                  +-------+---------------+---------+-----------+----------+--------------+ PTV    Full                                                        +-------+---------------+---------+-----------+----------+--------------+   Right Technical Findings: Not visualized segments include distal femoral vein, peroneal veins.  Left Technical Findings: Not visualized segments include CFV.   Summary: RIGHT: - There is no evidence of deep vein thrombosis in the lower extremity. However, portions of this examination were limited- see technologist comments above.  - No cystic structure found in the popliteal fossa.   *See table(s) above for measurements and observations. Electronically signed by Monica Martinez MD on 10/24/2020 at 11:01:12 AM.    Final      TODAY-DAY OF DISCHARGE:  Subjective:   Ginette [place] today has no headache,no chest abdominal pain,no new weakness tingling or numbness, feels much better wants to go home today.   Objective:   Blood pressure (!) 159/67, pulse 65, temperature 98.8 F (37.1 C), temperature source Oral, resp. rate 18, height 4\' 11"  (1.499 m), weight 103.2 kg, SpO2 100 %.  Intake/Output Summary (Last 24 hours) at 10/27/2020 1332 Last data filed at 10/27/2020 1048 Gross per 24 hour  Intake 720 ml  Output 500 ml  Net 220 ml   Filed Weights   10/25/20 0700 10/26/20 0525 10/27/20 0545  Weight: 95.1 kg 101.7 kg 103.2 kg    Exam: Awake Alert, Oriented *3, No new F.N deficits, Normal affect [place].AT,PERRAL Supple Neck,No JVD, No cervical lymphadenopathy appriciated.  Symmetrical Chest wall movement, Good air movement bilaterally, CTAB RRR,No Gallops,Rubs or new Murmurs, No Parasternal Heave +ve B.Sounds, Abd Soft, Non tender, No organomegaly appriciated, No rebound -guarding or rigidity. No Cyanosis, Clubbing or edema, No new Rash or bruise   PERTINENT RADIOLOGIC STUDIES: DG Chest 1 View  Result Date: 10/25/2020 CLINICAL DATA:  Shortness of breath EXAM: CHEST  1 VIEW  COMPARISON:  10/23/2020 FINDINGS: Cardiac shadow is enlarged. Vascular congestion has improved significantly. No focal infiltrate or effusion is noted. No acute bony abnormality is noted. IMPRESSION: Mild vascular congestion without edema. Electronically Signed   By: Inez Catalina M.D.   On: 10/25/2020 20:26     PERTINENT LAB RESULTS: CBC: Recent Labs    10/25/20 0621  WBC 6.9  HGB 9.1*  HCT 28.8*  PLT 289   CMET CMP     Component Value Date/Time   NA 135 10/25/2020 0621   NA 133 (L) 03/16/2020 0937   K 4.3 10/25/2020 0621   CL 100 10/25/2020 0621   CO2 28 10/25/2020 0621   GLUCOSE 179 (H) 10/25/2020 0621   BUN 58 (H) 10/25/2020 7062  BUN 62 (H) 03/16/2020 0937   CREATININE 3.45 (H) 10/25/2020 0621   CALCIUM 8.9 10/25/2020 0621   CALCIUM 9.9 02/19/2019 1037   PROT 6.2 (L) 10/24/2020 0225   ALBUMIN 3.3 (L) 10/24/2020 0225   AST 17 10/24/2020 0225   ALT 17 10/24/2020 0225   ALKPHOS 65 10/24/2020 0225   BILITOT 0.4 10/24/2020 0225   GFRNONAA 13 (L) 10/25/2020 0621   GFRAA 18 (L) 03/16/2020 0937    GFR Estimated Creatinine Clearance: 14.7 mL/min (A) (by C-G formula based on SCr of 3.45 mg/dL (H)). No results for input(s): LIPASE, AMYLASE in the last 72 hours. No results for input(s): CKTOTAL, CKMB, CKMBINDEX, TROPONINI in the last 72 hours. Invalid input(s): POCBNP No results for input(s): DDIMER in the last 72 hours. No results for input(s): HGBA1C in the last 72 hours. No results for input(s): CHOL, HDL, LDLCALC, TRIG, CHOLHDL, LDLDIRECT in the last 72 hours. No results for input(s): TSH, T4TOTAL, T3FREE, THYROIDAB in the last 72 hours.  Invalid input(s): FREET3 No results for input(s): VITAMINB12, FOLATE, FERRITIN, TIBC, IRON, RETICCTPCT in the last 72 hours. Coags: No results for input(s): INR in the last 72 hours.  Invalid input(s): PT Microbiology: Recent Results (from the past 240 hour(s))  SARS CORONAVIRUS 2 (TAT 6-24 HRS) Nasopharyngeal Nasopharyngeal  Swab     Status: None   Collection Time: 10/23/20  7:47 PM   Specimen: Nasopharyngeal Swab  Result Value Ref Range Status   SARS Coronavirus 2 NEGATIVE NEGATIVE Final    Comment: (NOTE) SARS-CoV-2 target nucleic acids are NOT DETECTED.  The SARS-CoV-2 RNA is generally detectable in upper and lower respiratory specimens during the acute phase of infection. Negative results do not preclude SARS-CoV-2 infection, do not rule out co-infections with other pathogens, and should not be used as the sole basis for treatment or other patient management decisions. Negative results must be combined with clinical observations, patient history, and epidemiological information. The expected result is Negative.  Fact Sheet for Patients: SugarRoll.be  Fact Sheet for Healthcare Providers: https://www.woods-mathews.com/  This test is not yet approved or cleared by the [place] FDA and  has been authorized for detection and/or diagnosis of SARS-CoV-2 by FDA under an Emergency Use Authorization (EUA). This EUA will remain  in effect (meaning this test can be used) for the duration of the COVID-19 declaration under Se ction 564(b)(1) of the Act, 21 U.S.C. section 360bbb-3(b)(1), unless the authorization is terminated or revoked sooner.  Performed at [facility], [place] [address] [place], [place] [address]     FURTHER DISCHARGE INSTRUCTIONS:  Get Medicines reviewed and adjusted: Please take all your medications with you for your next visit with your Primary MD  Laboratory/radiological data: Please request your Primary MD to go over all hospital tests and procedure/radiological results at the follow up, please ask your Primary MD to get all Hospital records sent to his/her office.  In some cases, they will be blood work, cultures and biopsy results pending at the time of your discharge. Please request that your primary care M.D. goes through all  the records of your hospital data and follows up on these results.  Also Note the following: If you experience worsening of your admission symptoms, develop shortness of breath, life threatening emergency, suicidal or homicidal thoughts you must seek medical attention immediately by calling 911 or calling your MD immediately  if symptoms less severe.  You must read complete instructions/literature along with all the possible adverse reactions/side effects for  all the Medicines you take and that have been prescribed to you. Take any new Medicines after you have completely understood and accpet all the possible adverse reactions/side effects.   Do not drive when taking Pain medications or sleeping medications (Benzodaizepines)  Do not take more than prescribed Pain, Sleep and Anxiety Medications. It is not advisable to combine anxiety,sleep and pain medications without talking with your primary care practitioner  Special Instructions: If you have smoked or chewed Tobacco  in the last 2 yrs please stop smoking, stop any regular Alcohol  and or any Recreational drug use.  Wear Seat belts while driving.  Please note: You were cared for by a hospitalist during your hospital stay. Once you are discharged, your primary care physician will handle any further medical issues. Please note that NO REFILLS for any discharge medications will be authorized once you are discharged, as it is imperative that you return to your primary care physician (or establish a relationship with a primary care physician if you do not have one) for your post hospital discharge needs so that they can reassess your need for medications and monitor your lab values.  Total Time spent coordinating discharge including counseling, education and face to face time equals 35 minutes.  Signed: Almena Hokenson 10/27/2020 1:32 PM

## 2020-10-27 NOTE — TOC Progression Note (Addendum)
Transition of Care [facility]) - Progression Note    Patient Details  Name: Amanda Cain MRN: [id number] Date of Birth: [birth year]  Transition of Care [facility]) CM/SW Contact  Reece Agar, [place] Phone Number: 10/27/2020, 1:03 PM  Clinical Narrative:    43: CSW went over SNF offers to pt, she chose [facility] contacted [facility] for bed availability, they confirmed availability, auth started. CSW will follow up.   1545: CSW spoke with pt sister she had concerns about pt not going back to [place], she stated her and her other sister will go get pt things from [place] but she does not think it will not make a big difference going to another facility. CSW explained pt is oriented, has declined [place], and has no help at home. CSW will follow up with pt DC.   Expected Discharge Plan: Skilled Nursing Facility Barriers to Discharge: Continued Medical Work up  Expected Discharge Plan and Services Expected Discharge Plan: [place] In-house Referral: Clinical Social Work     Living arrangements for the past 2 months: [place] (was recently at [facility] but lives at home alone)                                       Social Determinants of Health ([place]) Interventions    Readmission Risk Interventions Readmission Risk Prevention Plan 02/26/2020  Transportation Screening Complete  Medication Review Press photographer) Complete  PCP or Specialist appointment within 3-5 days of discharge Complete  SW Recovery Care/Counseling Consult Complete  [place] Patient Refused  Some recent data might be hidden

## 2020-10-27 NOTE — Progress Notes (Signed)
D/C instructions given and reviewed. Tele and IV removed, tolerated well. Awaiting [facility] for transport.

## 2020-10-27 NOTE — Progress Notes (Signed)
Nutrition Follow-up  DOCUMENTATION CODES:   Morbid obesity  INTERVENTION:   -Continue Glucerna Shake po TID, each supplement provides 220 kcal and 10 grams of protein  NUTRITION DIAGNOSIS:   Increased nutrient needs related to chronic illness as evidenced by estimated needs.  Ongoing  GOAL:   Patient will meet greater than or equal to 90% of their needs  Progressing   MONITOR:   PO intake,Supplement acceptance,Labs,Weight trends,I & O's  REASON FOR ASSESSMENT:   Consult COPD Protocol  ASSESSMENT:   75 year old female who presented to the ED on 5/27 with SOB. PMH of anxiety, depression, osteoarthritis, asthma, bronchitis, CHF, CKD stage IIIa, T2DM, GERD, cholelithiasis, HLD, HTN, hypothyroidism. Pt with recent admission for acute on chronic CHF and discharged to SNF due to physical deconditioning. Pt admitted with CAP, COPD exacerbation.  Reviewed I/O's: -225 ml x 24 hours and +497 ml since admission  UOP: 825 ml x 24 hours  Pt sleeping soundly in recliner chair at time of visit. She did not respond to voice or touch.   Pt remains with good appetite. Noted meal completion 50-100%. She is consuming Glucerna supplements per MAR.   Medications reviewed and include cardizem, demadex, florastor, and prednisone.   Labs reviewed: CBGS: 132-260 (inpatient orders for glycemic control are 0-6 units insulin aspart TID with meals and 10 units insulin glargine daily).   NUTRITION - FOCUSED PHYSICAL EXAM:  Flowsheet Row Most Recent Value  Orbital Region No depletion  Upper Arm Region No depletion  Thoracic and Lumbar Region No depletion  Buccal Region No depletion  Temple Region No depletion  Clavicle Bone Region No depletion  Clavicle and Acromion Bone Region No depletion  Scapular Bone Region No depletion  Dorsal Hand No depletion  Patellar Region No depletion  Anterior Thigh Region No depletion  Posterior Calf Region No depletion  Edema (RD Assessment) Mild  Hair  Reviewed  Eyes Reviewed  Mouth Reviewed  Skin Reviewed  Nails Reviewed       Diet Order:   Diet Order            DIET SOFT Room service appropriate? Yes; Fluid consistency: Thin  Diet effective now                 EDUCATION NEEDS:   No education needs have been identified at this time  Skin:  Skin Assessment: Reviewed RN Assessment  Last BM:  10/26/20  Height:   Ht Readings from Last 1 Encounters:  10/23/20 4\' 11"  (1.499 m)    Weight:   Wt Readings from Last 1 Encounters:  10/27/20 103.2 kg    BMI:  Body mass index is 45.95 kg/m.  Estimated Nutritional Needs:   Kcal:  1650-1850  Protein:  85-100 grams  Fluid:  1.6-1.8 L    Loistine Chance, RD, LDN, [place] Registered Dietitian II Certified Diabetes Care and Education Specialist Please refer to [facility] for RD and/or RD on-call/weekend/after hours pager

## 2020-10-27 NOTE — Progress Notes (Signed)
  Mobility Specialist Criteria Algorithm Info.  Mobility Team: [facility] elevated:HOB greater than 30 Activity: Ambulated to bathroom; Ambulated in room (in chair before and after) Range of motion: Active; All extremities Level of assistance: Minimal assist, patient does 75% or more (min A sit/stand, close min G ambulating) Assistive device: Front wheel walker Minutes sitting in chair:  Minutes stood: 3 minutes Minutes ambulated: 3 minutes Distance ambulated (ft): 25 ft Mobility response: Tolerated well Bed Position: Chair  Patient received in chair, agreed to participate in mobility after some encouragement. Pt stood from chair with minimal HHA + cues for hand placement. Ambulated in room to bathroom, 25 feet at min G with slow steady gait. She is deconditioned and LE are weak but she mentioned being stronger than her baseline. Tolerated ambulation well without complaint but fatigues quickly and can only tolerate short distanced ambulation.   10/27/2020 3:02 PM

## 2020-10-27 NOTE — Plan of Care (Signed)
  Problem: Increased Nutrient Needs (NI-5.1) Goal: Food and/or nutrient delivery Description: Individualized approach for food/nutrient provision. Outcome: Adequate for Discharge   Problem: Acute Rehab PT Goals(only PT should resolve) Goal: Pt Will Go Supine/Side To Sit Outcome: Adequate for Discharge Goal: Pt Will Go Sit To Supine/Side Outcome: Adequate for Discharge Goal: Patient Will Transfer Sit To/From Stand Outcome: Adequate for Discharge Goal: Pt Will Ambulate Outcome: Adequate for Discharge   Problem: Education: Goal: Ability to demonstrate management of disease process will improve Outcome: Adequate for Discharge Goal: Ability to verbalize understanding of medication therapies will improve Outcome: Adequate for Discharge Goal: Individualized Educational Video(s) Outcome: Adequate for Discharge

## 2020-10-27 NOTE — Consult Note (Signed)
   [facility] Inpatient Consult   10/27/2020  [name] [birth year] [id number]   [facility] [ACO] Patient: Amanda Cain   Patient screened for hospitalization with noted extreme high risk score for unplanned readmission risk and to assess for potential [place] Management service needs for post hospital transition.  Review of patient's medical record reveals patient is being recommended for SNF, also reviewed PT/OT notes for potential home health.  Discussed in progression meeting patient to be presented offers and need insurance approval noted.  Plan:  Will await for disposition. Continue to follow progress and disposition to assess for post hospital care management needs.    For questions contact:   Natividad Brood, RN BSN [facility] Liaison  [phone] business mobile phone Toll free office [phone]  Fax number: [phone] [email or web address]

## 2020-10-27 NOTE — Progress Notes (Signed)
Report called to Rod Holler, Therapist, sports at [place].

## 2020-10-27 NOTE — TOC Transition Note (Signed)
Transition of Care [facility]) - CM/SW Discharge Note   Patient Details  Name: Nelani Schmelzle MRN: [id number] Date of Birth: 1946/03/18  Transition of Care [facility]) CM/SW Contact:  Tresa Endo Phone Number: 10/27/2020, 3:20 PM   Clinical Narrative:    Patient will DC to: [facility] Anticipated DC date: 10/27/2020 Family notified: Pt Sister Transport by: Corey Harold   Per MD patient ready for DC to [facility]. RN to call report prior to discharge [phone]). RN, patient, patient's family, and facility notified of DC. Discharge Summary and FL2 sent to facility. DC packet on chart. Ambulance transport requested for patient.   CSW will sign off for now as social work intervention is no longer needed. Please consult [place] again if new needs arise.        Barriers to Discharge: Continued Medical Work up   Patient Goals and CMS Choice Patient states their goals for this hospitalization and ongoing recovery are:: to go to SNF CMS Medicare.gov Compare Post Acute Care list provided to:: Patient Choice offered to / list presented to : Patient  Discharge Placement                       Discharge Plan and Services In-house Referral: Clinical Social Work                                   Social Determinants of Health ([place]) Interventions     Readmission Risk Interventions Readmission Risk Prevention Plan 02/26/2020  Transportation Screening Complete  Medication Review Press photographer) Complete  PCP or Specialist appointment within 3-5 days of discharge Complete  SW Recovery Care/Counseling Consult Complete  [place] Patient Refused  Some recent data might be hidden

## 2020-10-27 NOTE — Progress Notes (Signed)
Report given to Gwenette Greet, EMT with [facility] for patient transfer to [place].

## 2020-10-28 DIAGNOSIS — I517 Cardiomegaly: Secondary | ICD-10-CM | POA: Diagnosis not present

## 2020-10-28 DIAGNOSIS — Z823 Family history of stroke: Secondary | ICD-10-CM | POA: Diagnosis not present

## 2020-10-28 DIAGNOSIS — I7091 Generalized atherosclerosis: Secondary | ICD-10-CM | POA: Diagnosis not present

## 2020-10-28 DIAGNOSIS — Z8249 Family history of ischemic heart disease and other diseases of the circulatory system: Secondary | ICD-10-CM | POA: Diagnosis not present

## 2020-10-28 DIAGNOSIS — J9621 Acute and chronic respiratory failure with hypoxia: Secondary | ICD-10-CM | POA: Diagnosis not present

## 2020-10-28 DIAGNOSIS — F32A Depression, unspecified: Secondary | ICD-10-CM | POA: Diagnosis not present

## 2020-10-28 DIAGNOSIS — E441 Mild protein-calorie malnutrition: Secondary | ICD-10-CM | POA: Diagnosis not present

## 2020-10-28 DIAGNOSIS — I5031 Acute diastolic (congestive) heart failure: Secondary | ICD-10-CM | POA: Diagnosis not present

## 2020-10-28 DIAGNOSIS — I130 Hypertensive heart and chronic kidney disease with heart failure and stage 1 through stage 4 chronic kidney disease, or unspecified chronic kidney disease: Secondary | ICD-10-CM | POA: Diagnosis not present

## 2020-10-28 DIAGNOSIS — R0602 Shortness of breath: Secondary | ICD-10-CM | POA: Diagnosis not present

## 2020-10-28 DIAGNOSIS — M7918 Myalgia, other site: Secondary | ICD-10-CM | POA: Diagnosis not present

## 2020-10-28 DIAGNOSIS — Z515 Encounter for palliative care: Secondary | ICD-10-CM | POA: Diagnosis not present

## 2020-10-28 DIAGNOSIS — K5901 Slow transit constipation: Secondary | ICD-10-CM | POA: Diagnosis not present

## 2020-10-28 DIAGNOSIS — I272 Pulmonary hypertension, unspecified: Secondary | ICD-10-CM | POA: Diagnosis not present

## 2020-10-28 DIAGNOSIS — R6889 Other general symptoms and signs: Secondary | ICD-10-CM | POA: Diagnosis not present

## 2020-10-28 DIAGNOSIS — J811 Chronic pulmonary edema: Secondary | ICD-10-CM | POA: Diagnosis not present

## 2020-10-28 DIAGNOSIS — Z66 Do not resuscitate: Secondary | ICD-10-CM | POA: Diagnosis not present

## 2020-10-28 DIAGNOSIS — M1990 Unspecified osteoarthritis, unspecified site: Secondary | ICD-10-CM | POA: Diagnosis not present

## 2020-10-28 DIAGNOSIS — D649 Anemia, unspecified: Secondary | ICD-10-CM | POA: Diagnosis not present

## 2020-10-28 DIAGNOSIS — R2689 Other abnormalities of gait and mobility: Secondary | ICD-10-CM | POA: Diagnosis not present

## 2020-10-28 DIAGNOSIS — J45901 Unspecified asthma with (acute) exacerbation: Secondary | ICD-10-CM | POA: Diagnosis not present

## 2020-10-28 DIAGNOSIS — K219 Gastro-esophageal reflux disease without esophagitis: Secondary | ICD-10-CM | POA: Diagnosis not present

## 2020-10-28 DIAGNOSIS — J90 Pleural effusion, not elsewhere classified: Secondary | ICD-10-CM | POA: Diagnosis not present

## 2020-10-28 DIAGNOSIS — I2721 Secondary pulmonary arterial hypertension: Secondary | ICD-10-CM | POA: Diagnosis not present

## 2020-10-28 DIAGNOSIS — D631 Anemia in chronic kidney disease: Secondary | ICD-10-CM | POA: Diagnosis not present

## 2020-10-28 DIAGNOSIS — J45909 Unspecified asthma, uncomplicated: Secondary | ICD-10-CM | POA: Diagnosis not present

## 2020-10-28 DIAGNOSIS — Z8042 Family history of malignant neoplasm of prostate: Secondary | ICD-10-CM | POA: Diagnosis not present

## 2020-10-28 DIAGNOSIS — J9611 Chronic respiratory failure with hypoxia: Secondary | ICD-10-CM | POA: Diagnosis not present

## 2020-10-28 DIAGNOSIS — N259 Disorder resulting from impaired renal tubular function, unspecified: Secondary | ICD-10-CM | POA: Diagnosis not present

## 2020-10-28 DIAGNOSIS — N189 Chronic kidney disease, unspecified: Secondary | ICD-10-CM | POA: Diagnosis not present

## 2020-10-28 DIAGNOSIS — I129 Hypertensive chronic kidney disease with stage 1 through stage 4 chronic kidney disease, or unspecified chronic kidney disease: Secondary | ICD-10-CM | POA: Diagnosis not present

## 2020-10-28 DIAGNOSIS — K5900 Constipation, unspecified: Secondary | ICD-10-CM | POA: Diagnosis not present

## 2020-10-28 DIAGNOSIS — R269 Unspecified abnormalities of gait and mobility: Secondary | ICD-10-CM | POA: Diagnosis not present

## 2020-10-28 DIAGNOSIS — Z20822 Contact with and (suspected) exposure to covid-19: Secondary | ICD-10-CM | POA: Diagnosis not present

## 2020-10-28 DIAGNOSIS — Z96653 Presence of artificial knee joint, bilateral: Secondary | ICD-10-CM | POA: Diagnosis not present

## 2020-10-28 DIAGNOSIS — I119 Hypertensive heart disease without heart failure: Secondary | ICD-10-CM | POA: Diagnosis not present

## 2020-10-28 DIAGNOSIS — Z820 Family history of epilepsy and other diseases of the nervous system: Secondary | ICD-10-CM | POA: Diagnosis not present

## 2020-10-28 DIAGNOSIS — I5033 Acute on chronic diastolic (congestive) heart failure: Secondary | ICD-10-CM | POA: Diagnosis not present

## 2020-10-28 DIAGNOSIS — J189 Pneumonia, unspecified organism: Secondary | ICD-10-CM | POA: Diagnosis present

## 2020-10-28 DIAGNOSIS — E559 Vitamin D deficiency, unspecified: Secondary | ICD-10-CM | POA: Diagnosis not present

## 2020-10-28 DIAGNOSIS — I110 Hypertensive heart disease with heart failure: Secondary | ICD-10-CM | POA: Diagnosis not present

## 2020-10-28 DIAGNOSIS — E1122 Type 2 diabetes mellitus with diabetic chronic kidney disease: Secondary | ICD-10-CM | POA: Diagnosis not present

## 2020-10-28 DIAGNOSIS — Z6841 Body Mass Index (BMI) 40.0 and over, adult: Secondary | ICD-10-CM | POA: Diagnosis not present

## 2020-10-28 DIAGNOSIS — E7800 Pure hypercholesterolemia, unspecified: Secondary | ICD-10-CM | POA: Diagnosis not present

## 2020-10-28 DIAGNOSIS — Z96659 Presence of unspecified artificial knee joint: Secondary | ICD-10-CM | POA: Diagnosis not present

## 2020-10-28 DIAGNOSIS — J4531 Mild persistent asthma with (acute) exacerbation: Secondary | ICD-10-CM | POA: Diagnosis not present

## 2020-10-28 DIAGNOSIS — Z9189 Other specified personal risk factors, not elsewhere classified: Secondary | ICD-10-CM | POA: Diagnosis not present

## 2020-10-28 DIAGNOSIS — N184 Chronic kidney disease, stage 4 (severe): Secondary | ICD-10-CM | POA: Diagnosis not present

## 2020-10-28 DIAGNOSIS — I5032 Chronic diastolic (congestive) heart failure: Secondary | ICD-10-CM | POA: Diagnosis not present

## 2020-10-28 DIAGNOSIS — M533 Sacrococcygeal disorders, not elsewhere classified: Secondary | ICD-10-CM | POA: Diagnosis not present

## 2020-10-28 DIAGNOSIS — J9601 Acute respiratory failure with hypoxia: Secondary | ICD-10-CM | POA: Diagnosis not present

## 2020-10-28 DIAGNOSIS — N2581 Secondary hyperparathyroidism of renal origin: Secondary | ICD-10-CM | POA: Diagnosis not present

## 2020-10-28 DIAGNOSIS — R739 Hyperglycemia, unspecified: Secondary | ICD-10-CM | POA: Diagnosis not present

## 2020-10-28 DIAGNOSIS — Z7189 Other specified counseling: Secondary | ICD-10-CM | POA: Diagnosis not present

## 2020-10-28 DIAGNOSIS — I10 Essential (primary) hypertension: Secondary | ICD-10-CM | POA: Diagnosis not present

## 2020-10-28 DIAGNOSIS — I1310 Hypertensive heart and chronic kidney disease without heart failure, with stage 1 through stage 4 chronic kidney disease, or unspecified chronic kidney disease: Secondary | ICD-10-CM | POA: Diagnosis not present

## 2020-10-28 DIAGNOSIS — J9622 Acute and chronic respiratory failure with hypercapnia: Secondary | ICD-10-CM | POA: Diagnosis not present

## 2020-10-28 DIAGNOSIS — I700 Atherosclerosis of aorta: Secondary | ICD-10-CM | POA: Diagnosis not present

## 2020-10-28 DIAGNOSIS — E785 Hyperlipidemia, unspecified: Secondary | ICD-10-CM | POA: Diagnosis not present

## 2020-10-28 DIAGNOSIS — E782 Mixed hyperlipidemia: Secondary | ICD-10-CM | POA: Diagnosis not present

## 2020-10-28 DIAGNOSIS — Z743 Need for continuous supervision: Secondary | ICD-10-CM | POA: Diagnosis not present

## 2020-10-28 DIAGNOSIS — J45998 Other asthma: Secondary | ICD-10-CM | POA: Diagnosis not present

## 2020-10-28 DIAGNOSIS — E039 Hypothyroidism, unspecified: Secondary | ICD-10-CM | POA: Diagnosis not present

## 2020-10-28 DIAGNOSIS — D638 Anemia in other chronic diseases classified elsewhere: Secondary | ICD-10-CM | POA: Diagnosis not present

## 2020-10-28 DIAGNOSIS — R61 Generalized hyperhidrosis: Secondary | ICD-10-CM | POA: Diagnosis not present

## 2020-10-28 NOTE — Progress Notes (Signed)
Patient discharged at this time to [facility] with [facility].
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
## 2020-11-04 ENCOUNTER — Ambulatory Visit: Payer: Medicare Other | Admitting: Interventional Cardiology

## 2020-11-04 DIAGNOSIS — J45901 Unspecified asthma with (acute) exacerbation: Secondary | ICD-10-CM | POA: Diagnosis not present

## 2020-11-04 DIAGNOSIS — I5032 Chronic diastolic (congestive) heart failure: Secondary | ICD-10-CM | POA: Diagnosis not present

## 2020-11-04 DIAGNOSIS — I110 Hypertensive heart disease with heart failure: Secondary | ICD-10-CM

## 2020-11-04 DIAGNOSIS — N184 Chronic kidney disease, stage 4 (severe): Secondary | ICD-10-CM

## 2020-11-04 DIAGNOSIS — K5901 Slow transit constipation: Secondary | ICD-10-CM | POA: Diagnosis not present

## 2020-11-04 DIAGNOSIS — E782 Mixed hyperlipidemia: Secondary | ICD-10-CM

## 2020-11-04 DIAGNOSIS — I119 Hypertensive heart disease without heart failure: Secondary | ICD-10-CM | POA: Diagnosis not present
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
## 2020-11-06 ENCOUNTER — Other Ambulatory Visit: Payer: Self-pay

## 2020-11-06 ENCOUNTER — Encounter: Payer: Self-pay | Admitting: Interventional Cardiology

## 2020-11-06 ENCOUNTER — Ambulatory Visit (INDEPENDENT_AMBULATORY_CARE_PROVIDER_SITE_OTHER): Payer: Medicare Other | Admitting: Interventional Cardiology

## 2020-11-06 VITALS — BP 134/60 | HR 63 | Ht 59.0 in | Wt 223.2 lb

## 2020-11-06 DIAGNOSIS — N184 Chronic kidney disease, stage 4 (severe): Secondary | ICD-10-CM

## 2020-11-06 DIAGNOSIS — I110 Hypertensive heart disease with heart failure: Secondary | ICD-10-CM

## 2020-11-06 DIAGNOSIS — E785 Hyperlipidemia, unspecified: Secondary | ICD-10-CM | POA: Diagnosis not present

## 2020-11-06 DIAGNOSIS — I5032 Chronic diastolic (congestive) heart failure: Secondary | ICD-10-CM

## 2020-11-06 NOTE — Patient Instructions (Addendum)
Medication Instructions:  Your physician recommends that you continue on your current medications as directed. Please refer to the Current Medication list given to you today.  *If you need a refill on your cardiac medications before your next appointment, please call your pharmacy*   Lab Work: none If you have labs (blood work) drawn today and your tests are completely normal, you will receive your results only by: [place] (if you have MyChart) OR A paper copy in the mail If you have any lab test that is abnormal or we need to change your treatment, we will call you to review the results.   Testing/Procedures: none   Follow-Up: At [facility], you and your health needs are our priority.  As part of our continuing mission to provide you with exceptional heart care, we have created designated Provider Care Teams.  These Care Teams include your primary Cardiologist (physician) and Advanced Practice Providers (APPs -  Physician Assistants and Nurse Practitioners) who all work together to provide you with the care you need, when you need it.  We recommend signing up for the patient portal called "MyChart".  Sign up information is provided on this After Visit Summary.  MyChart is used to connect with patients for Virtual Visits (Telemedicine).  Patients are able to view lab/test results, encounter notes, upcoming appointments, etc.  Non-urgent messages can be sent to your provider as well.   To learn more about what you can do with MyChart, go to [email or web address].    Your next appointment:   6 month(s)--December 92,0100 at 10:20  The format for your next appointment:   In Person  Provider:   You may see Larae Grooms, MD or one of the following Advanced Practice Providers on your designated Care Team:   Melina Copa, PA-C Ermalinda Barrios, PA-C   Other Instructions

## 2020-11-11 DIAGNOSIS — I119 Hypertensive heart disease without heart failure: Secondary | ICD-10-CM | POA: Diagnosis not present

## 2020-11-11 DIAGNOSIS — J45998 Other asthma: Secondary | ICD-10-CM | POA: Diagnosis not present

## 2020-11-11 DIAGNOSIS — K219 Gastro-esophageal reflux disease without esophagitis: Secondary | ICD-10-CM | POA: Diagnosis not present

## 2020-11-11 DIAGNOSIS — I5032 Chronic diastolic (congestive) heart failure: Secondary | ICD-10-CM | POA: Diagnosis not present

## 2020-11-13 DIAGNOSIS — N184 Chronic kidney disease, stage 4 (severe): Secondary | ICD-10-CM | POA: Diagnosis not present

## 2020-11-13 DIAGNOSIS — I5031 Acute diastolic (congestive) heart failure: Secondary | ICD-10-CM | POA: Diagnosis not present

## 2020-11-17 DIAGNOSIS — N189 Chronic kidney disease, unspecified: Secondary | ICD-10-CM | POA: Diagnosis not present

## 2020-11-17 DIAGNOSIS — N184 Chronic kidney disease, stage 4 (severe): Secondary | ICD-10-CM | POA: Diagnosis not present

## 2020-11-17 DIAGNOSIS — I129 Hypertensive chronic kidney disease with stage 1 through stage 4 chronic kidney disease, or unspecified chronic kidney disease: Secondary | ICD-10-CM | POA: Diagnosis not present

## 2020-11-17 DIAGNOSIS — D631 Anemia in chronic kidney disease: Secondary | ICD-10-CM | POA: Diagnosis not present

## 2020-11-17 DIAGNOSIS — N2581 Secondary hyperparathyroidism of renal origin: Secondary | ICD-10-CM | POA: Diagnosis not present

## 2020-11-19 DIAGNOSIS — Z96659 Presence of unspecified artificial knee joint: Secondary | ICD-10-CM | POA: Diagnosis not present

## 2020-11-19 DIAGNOSIS — R269 Unspecified abnormalities of gait and mobility: Secondary | ICD-10-CM | POA: Diagnosis not present

## 2020-11-19 DIAGNOSIS — I5032 Chronic diastolic (congestive) heart failure: Secondary | ICD-10-CM | POA: Diagnosis not present

## 2020-11-20 DIAGNOSIS — R2689 Other abnormalities of gait and mobility: Secondary | ICD-10-CM | POA: Diagnosis not present

## 2020-11-20 DIAGNOSIS — N184 Chronic kidney disease, stage 4 (severe): Secondary | ICD-10-CM | POA: Diagnosis not present

## 2020-11-20 DIAGNOSIS — I5032 Chronic diastolic (congestive) heart failure: Secondary | ICD-10-CM | POA: Diagnosis not present

## 2020-11-23 DIAGNOSIS — I5032 Chronic diastolic (congestive) heart failure: Secondary | ICD-10-CM | POA: Diagnosis not present

## 2020-11-23 DIAGNOSIS — M7918 Myalgia, other site: Secondary | ICD-10-CM | POA: Diagnosis not present

## 2020-11-23 DIAGNOSIS — N184 Chronic kidney disease, stage 4 (severe): Secondary | ICD-10-CM | POA: Diagnosis not present

## 2020-11-27 ENCOUNTER — Other Ambulatory Visit: Payer: Self-pay

## 2020-11-27 ENCOUNTER — Encounter (HOSPITAL_COMMUNITY): Payer: Self-pay

## 2020-11-27 ENCOUNTER — Non-Acute Institutional Stay: Payer: Medicare Other | Admitting: Hospice

## 2020-11-27 DIAGNOSIS — I5033 Acute on chronic diastolic (congestive) heart failure: Secondary | ICD-10-CM

## 2020-11-27 DIAGNOSIS — R0602 Shortness of breath: Secondary | ICD-10-CM | POA: Diagnosis not present

## 2020-11-27 DIAGNOSIS — D649 Anemia, unspecified: Secondary | ICD-10-CM | POA: Diagnosis not present

## 2020-11-27 DIAGNOSIS — Z515 Encounter for palliative care: Secondary | ICD-10-CM | POA: Diagnosis not present

## 2020-11-27 DIAGNOSIS — E559 Vitamin D deficiency, unspecified: Secondary | ICD-10-CM | POA: Diagnosis not present

## 2020-11-27 DIAGNOSIS — N184 Chronic kidney disease, stage 4 (severe): Secondary | ICD-10-CM | POA: Diagnosis not present

## 2020-11-27 DIAGNOSIS — I5032 Chronic diastolic (congestive) heart failure: Secondary | ICD-10-CM | POA: Diagnosis not present

## 2020-11-27 NOTE — Progress Notes (Signed)
[facility] Consult Note Telephone: [phone]  Fax: [phone]  PATIENT NAME: Amanda Cain [address] [address] [phone] (home)  DOB: 09-25-1945 MRN: [id number]  PRIMARY CARE PROVIDER:    Mayra Neer, MD,  [place] [employer] [place] [address] [phone]  REFERRING PROVIDER:   Dr. Lawson Radar  RESPONSIBLE PARTY:   Self Emergency contact: Amanda Cain Contact Information     Name Relation Home Work Mobile   Berlanga,Teresa Sister   [phone]   Malachy Moan   [phone]   Heinle,Geraldine Sister   [phone]        I met face to face with patient and sister Amanda Cain at the facility. Palliative Care was asked to follow this patient by consultation request of Dr. Lawson Radar to address advance care planning, complex medical decision making and goals of care clarification. [place] came to the facility for the visit. Patient endorsed palliative service. This is the initial visit.    ASSESSMENT AND / RECOMMENDATIONS:   Advance Care Planning: Our advance care planning conversation included a discussion about:    The value and importance of advance care planning  Difference between Hospice and Palliative care Exploration of goals of care in the event of a sudden injury or illness  Identification and preparation of a healthcare agent  Review and updating or creation of an  advance directive document .   CODE STATUS:Extensive discussion on implications and ramifications of code status. Patient affirmed she is a Full code. She verbalized understanding she can change it anytime. Amanda Cain explained that family members do not agree with patient on this. Education provided to uphold patient's wish, giving her time to continue to think about it and give her the opportunity to change her code status in future as she sees fit.   Goals of Care: Goals include to maximize  quality of life and symptom management  I spent  46 minutes providing this initial consultation. More than 50% of the time in this consultation was spent on counseling patient and coordinating communication. --------------------------------------------------------------------------------------------------------------------------------------  Symptom Management/Plan: Shortness of breath related to CHF and Asthma. Continue 02 supplementation and breathing treatments - Albuterol, Duoneb, and Singular.  CHF: Continue Torsemide. Fluid restriction, 1500cc/24 hours, reduced salt intake. CKD: worsening. Last GFR is 13  10/24/2020. Followed by Nephrologist supplementation at 2L/Min. Continue breathing treatments  Weakness: PT/OT for strengthening and gait training.  Routine CBC BMP  Follow up: Palliative care will continue to follow for complex medical decision making, advance care planning, and clarification of goals. Return 6 weeks or prn.Encouraged to call provider sooner with any concerns.   Family /Caregiver/Community Supports: patient in SNF for ongoing care  HOSPICE ELIGIBILITY/DIAGNOSIS: TBD  Chief Complaint: Initial Palliative care visit  HISTORY OF PRESENT ILLNESS:  Amanda Cain is a 75 y.o. year old female  with multiple medical conditions including  worsening shortness of breath related to acute on chronic diastolic CHF.  Hospitalization 5/27- 10/27/2020 for shortness of breath related to CHF and Asthma exacerbation; treated and dischrged to SNF for acute rehab. Patient reports shortness of breath impairs her activities of daily living, worse during hygiene care and with fluid build up. Current oxygen supplementation and diuretics are helpful. History of CKD stage 4, HTN, HLD, DM type II, hypothyroidism anxiety.   History obtained from review of EMR, discussion with primary team, caregiver, family and/or Ms. Torrence.  Review and summarization of Epic records shows history from  other  than patient. Rest of 10 point ROS asked and negative.     Review of lab tests/diagnostics   Results for BRIDGITTE, FELICETTI (MRN [id number]) as of 11/27/2020 09:53  Ref. Range 10/24/2020 02:25  Sodium Latest Ref Range: 135 - 145 mmol/L 137  Potassium Latest Ref Range: 3.5 - 5.1 mmol/L 3.5  Chloride Latest Ref Range: 98 - 111 mmol/L 97 (L)  CO2 Latest Ref Range: 22 - 32 mmol/L 28  Glucose Latest Ref Range: 70 - 99 mg/dL 108 (H)  BUN Latest Ref Range: 8 - 23 mg/dL 46 (H)  Creatinine Latest Ref Range: 0.44 - 1.00 mg/dL 3.43 (H)  Calcium Latest Ref Range: 8.9 - 10.3 mg/dL 9.2  Anion gap Latest Ref Range: 5 - 15  12  Alkaline Phosphatase Latest Ref Range: 38 - 126 U/L 65  Albumin Latest Ref Range: 3.5 - 5.0 g/dL 3.3 (L)  AST Latest Ref Range: 15 - 41 U/L 17  ALT Latest Ref Range: 0 - 44 U/L 17  Total Protein Latest Ref Range: 6.5 - 8.1 g/dL 6.2 (L)  Total Bilirubin Latest Ref Range: 0.3 - 1.2 mg/dL 0.4  GFR, Estimated Latest Ref Range: >60 mL/min 13 (L)    ROS General: NAD EYES: denies vision changes ENMT: denies dysphagia Cardiovascular: denies chest pain/discomfort Pulmonary: denies cough, endorses SOB Abdomen: endorses good appetite, denies constipation/diarrhea GU: denies dysuria, urinary frequency MSK:  endorses weakness,  no falls reported Skin: denies rashes or wounds Neurological: denies pain, denies insomnia Psych: Endorses positive mood Heme/lymph/immuno: denies bruises, abnormal bleeding  Physical Exam: Ht 4' 11"  (1.499 m)   Wt 224.6 lb 3.2 oz (101.2 kg)   SpO2 97%  on 2L/Min  BMI 45.08 kg/m   Constitutional: NAD General: Well groomed, cooperative EYES: anicteric sclera, lids intact, no discharge  ENMT: Moist mucous membrane CV: S1 S2, RRR, trace LE edema Pulmonary:  no increased work of breathing, oxygen supplementation 2L/Min, expiratory wheeze Abdomen: active BS + 4 quadrants, soft and non tender GU: no suprapubic tenderness MSK: weakness, limited ROM Skin:  warm and dry, no rashes or wounds on visible skin Neuro:  weakness, otherwise non focal Psych: non-anxious affect Hem/lymph/immuno: no widespread bruising   PAST MEDICAL HISTORY:  Active Ambulatory Problems    Diagnosis Date Noted   Diabetes ([place]) 07/15/2011   Abdominal pain 11/19/2011   Hyponatremia 11/19/2011   Normocytic anemia 11/19/2011   Acute kidney injury superimposed on CKD ([place]) 11/19/2011   Moderate protein-calorie malnutrition ([facility]) 11/19/2011   Essential hypertension 11/19/2011   Hypothyroidism, acquired 11/19/2011   Dyslipidemia 11/19/2011   Syncope and collapse 11/20/2011   SBP (spontaneous bacterial peritonitis) ([place]) 11/25/2011   Hyperglycemia 11/30/2011   Osteoarthritis of right knee 10/31/2012   Postoperative anemia due to acute blood loss 11/01/2012   Hyperlipidemia 11/25/2012   Depression 11/25/2012   GERD (gastroesophageal reflux disease) 11/25/2012   S/P laparoscopic cholecystectomy 03/02/2015   Acute on chronic diastolic heart failure ([place]) 11/17/2016   CKD (chronic kidney disease), stage III ([place]) 11/17/2016   Ascites    Influenza A 07/05/2017   Acute on chronic respiratory failure with hypoxia ([place]) 07/05/2017   Flu 07/07/2017   Morbid obesity ([place]) 11/21/2017   CHF (congestive heart failure) ([place]) 12/12/2017   Community acquired pneumonia 12/13/2017   CAP (community acquired pneumonia) 12/13/2017   Atypical chest pain 05/12/2018   Pulmonary hypertension ([place]) 05/14/2018   Chronic respiratory failure with hypoxia ([place]) 05/15/2018   Asthma 06/19/2018   Shortness of breath  08/09/2018   Pain due to onychomycosis of toenails of both feet 11/21/2018   Physical deconditioning 03/13/2019   Acute recurrent maxillary sinusitis 03/13/2019   Hypokalemia    Anxiety    Acute on chronic diastolic (congestive) heart failure ([place]) 02/20/2020   Acute exacerbation of CHF (congestive heart failure) ([place]) 04/16/2020   Acute CHF (congestive heart failure) ([place])  09/24/2020   Dyspnea 10/23/2020   Mild protein malnutrition ([place]) 10/24/2020   Class 3 obesity 10/24/2020   Aortic atherosclerosis ([place]) 10/24/2020   Resolved Ambulatory Problems    Diagnosis Date Noted   Osteoarthritis of left knee 07/15/2011   Septic shock(785.52) 11/21/2011   UTI (lower urinary tract infection) 11/25/2011   Past Medical History:  Diagnosis Date   Anemia    Arthritis    Bronchitis    Chronic kidney disease    Diabetes mellitus    Hx of gallstones    Hypercholesteremia    Hypertension    Hypothyroidism    Obesity     SOCIAL HX:  Social History   Tobacco Use   Smoking status: Never   Smokeless tobacco: Never  Substance Use Topics   Alcohol use: No     FAMILY HX:  Family History  Problem Relation Age of Onset   Alzheimer's disease Mother    Hypertension Mother    Stroke Father    Hypertension Father    Stroke Brother    Prostate cancer Brother    Diabetes Brother    Ovarian cancer Sister    Diabetes Sister    Breast cancer Neg Hx       ALLERGIES:  Allergies  Allergen Reactions   Ace Inhibitors Other (See Comments) and Cough    "Allergic," per MAR   Amlodipine Other (See Comments)    "Dizziness," per patient AND "Allergic," per the [facility]   Aspirin Other (See Comments)    "I have an ulcer," per the patient AND flagged as "Allergic," per the [facility]      PERTINENT MEDICATIONS:  Outpatient Encounter Medications as of 11/27/2020  Medication Sig   acetaminophen (TYLENOL) 500 MG tablet Take 500 mg by mouth in the morning.   albuterol (PROVENTIL HFA;VENTOLIN HFA) 108 (90 Base) MCG/ACT inhaler Inhale 2 puffs into the lungs every 4 (four) hours as needed for wheezing or shortness of breath.   albuterol (PROVENTIL) (2.5 MG/3ML) 0.083% nebulizer solution Take 3 mLs (2.5 mg total) by nebulization every 4 (four) hours as needed for wheezing or shortness of breath.   atorvastatin (LIPITOR) 80 MG tablet Take 80 mg by mouth at bedtime.   azelastine  (ASTELIN) 0.1 % nasal spray Place 1 spray into both nostrils 2 (two) times daily.   benzonatate (TESSALON) 100 MG capsule Take 1 capsule (100 mg total) by mouth 3 (three) times daily as needed for cough.   Biotin 1000 MCG tablet Take 1,000 mcg by mouth daily.   Cholecalciferol (VITAMIN D3) 25 MCG (1000 UT) CAPS Take 1,000 Units by mouth daily.   diclofenac Sodium (VOLTAREN) 1 % GEL Apply 2-4 g topically See admin instructions. Apply 2-4 grams to the left shoulder every morning   diltiazem (CARDIZEM CD) 360 MG 24 hr capsule Take 360 mg by mouth daily.    escitalopram (LEXAPRO) 20 MG tablet Take 20 mg by mouth at bedtime.    ezetimibe (ZETIA) 10 MG tablet Take 10 mg by mouth daily.   feeding supplement, GLUCERNA SHAKE, (GLUCERNA SHAKE) LIQD Take 237 mLs by mouth 3 (three) times daily  between meals.   ferrous gluconate (FERGON) 324 MG tablet Take 324 mg by mouth every Monday, Wednesday, and Friday.   fluticasone furoate-vilanterol (BREO ELLIPTA) 200-25 MCG/INH AEPB Inhale 1 puff into the lungs daily. INHALE 1 PUFF INTO THE LUNGS DAILY (Patient taking differently: Inhale 1 puff into the lungs in the morning.)   gabapentin (NEURONTIN) 100 MG capsule Take 100 mg by mouth 2 (two) times daily.   glucose blood (ONETOUCH VERIO) test strip 1 each by Other route 2 (two) times daily. and lancets 2/day   hydrALAZINE (APRESOLINE) 25 MG tablet Take 1 tablet (25 mg total) by mouth every 8 (eight) hours.   insulin aspart (NOVOLOG) 100 UNIT/ML injection Inject 0-6 Units into the skin 3 (three) times daily with meals. 0-6 Units, Subcutaneous, 3 times daily with meals CBG < 70: Implement Hypoglycemia measures CBG 70 - 120: 0 units CBG 121 - 150: 0 units CBG 151 - 200: 1 unit CBG 201-250: 2 units CBG 251-300: 3 units CBG 301-350: 4 units CBG 351-400: 5 units CBG > 400: Give 6 units and call MD   insulin degludec (TRESIBA FLEXTOUCH) 100 UNIT/ML FlexTouch Pen Inject 10 Units into the skin daily.    ipratropium-albuterol (DUONEB) 0.5-2.5 (3) MG/3ML SOLN Take 3 mLs by nebulization every 8 (eight) hours.   levothyroxine (SYNTHROID) 200 MCG tablet Take 200 mcg by mouth daily before breakfast.   LORazepam (ATIVAN) 0.5 MG tablet Take 0.5-1 tablets (0.25-0.5 mg total) by mouth See admin instructions. Take 0.25 mg by mouth two times a day and 0.5 mg at bedtime   METAMUCIL FIBER PO Take 3.4 g by mouth daily.   metoprolol succinate (TOPROL-XL) 50 MG 24 hr tablet Take 1 tablet (50 mg total) by mouth every morning. Take with or immediately following a meal.   montelukast (SINGULAIR) 10 MG tablet Take 1 tablet (10 mg total) by mouth at bedtime. TAKE 1 TABLET(10 MG) BY MOUTH AT BEDTIME (Patient taking differently: Take 10 mg by mouth at bedtime.)   Multiple Vitamin-Folic Acid TABS Take 1 tablet by mouth daily.   paliperidone (INVEGA) 3 MG 24 hr tablet Take 3 mg by mouth at bedtime.   pantoprazole (PROTONIX) 40 MG tablet TAKE 1 TABLET(40 MG) BY MOUTH DAILY (Patient taking differently: Take 40 mg by mouth daily before breakfast.)   polyvinyl alcohol (LIQUIFILM TEARS) 1.4 % ophthalmic solution Place 1 drop into both eyes 2 (two) times daily.   predniSONE (DELTASONE) 10 MG tablet Take  20 mg daily for 1 days,10 mg daily for 1 day, then stop   Probiotic Product (PROBIOTIC PO) Take 1 capsule by mouth 2 (two) times daily.   sucralfate (CARAFATE) 1 G tablet Take 1 g by mouth daily.   torsemide (DEMADEX) 20 MG tablet Take 40 mg by mouth in the morning.   vitamin B-12 (CYANOCOBALAMIN) 1000 MCG tablet Take 1,000 mcg by mouth daily.   vitamin C (ASCORBIC ACID) 500 MG tablet Take 500 mg by mouth daily.   No facility-administered encounter medications on file as of 11/27/2020.    Thank you for the opportunity to participate in the care of Ms. Barga.  The palliative care team will continue to follow. Please call our office at [phone] if we can be of additional assistance.   Note: Portions of this note were  generated with [employer]. Dictation errors may occur despite best attempts at proofreading.  Teodoro Spray, NP

## 2020-12-02 DIAGNOSIS — Z9189 Other specified personal risk factors, not elsewhere classified: Secondary | ICD-10-CM | POA: Diagnosis not present

## 2020-12-02 DIAGNOSIS — I1310 Hypertensive heart and chronic kidney disease without heart failure, with stage 1 through stage 4 chronic kidney disease, or unspecified chronic kidney disease: Secondary | ICD-10-CM | POA: Diagnosis not present

## 2020-12-04 ENCOUNTER — Inpatient Hospital Stay (HOSPITAL_COMMUNITY): Payer: Medicare Other

## 2020-12-04 ENCOUNTER — Inpatient Hospital Stay (HOSPITAL_COMMUNITY)
Admission: EM | Admit: 2020-12-04 | Discharge: 2020-12-07 | DRG: 193 | Disposition: A | Discharge status: Skilled Nursing Facility | Payer: Medicare Other | Source: Skilled Nursing Facility | Attending: Internal Medicine | Admitting: Internal Medicine

## 2020-12-04 ENCOUNTER — Encounter (HOSPITAL_COMMUNITY): Payer: Self-pay | Admitting: *Deleted

## 2020-12-04 ENCOUNTER — Other Ambulatory Visit: Payer: Self-pay

## 2020-12-04 ENCOUNTER — Emergency Department (HOSPITAL_COMMUNITY): Payer: Medicare Other

## 2020-12-04 DIAGNOSIS — J9622 Acute and chronic respiratory failure with hypercapnia: Secondary | ICD-10-CM | POA: Diagnosis present

## 2020-12-04 DIAGNOSIS — Z823 Family history of stroke: Secondary | ICD-10-CM

## 2020-12-04 DIAGNOSIS — Z20822 Contact with and (suspected) exposure to covid-19: Secondary | ICD-10-CM | POA: Diagnosis present

## 2020-12-04 DIAGNOSIS — E782 Mixed hyperlipidemia: Secondary | ICD-10-CM | POA: Diagnosis not present

## 2020-12-04 DIAGNOSIS — Z794 Long term (current) use of insulin: Secondary | ICD-10-CM

## 2020-12-04 DIAGNOSIS — J45901 Unspecified asthma with (acute) exacerbation: Secondary | ICD-10-CM

## 2020-12-04 DIAGNOSIS — I130 Hypertensive heart and chronic kidney disease with heart failure and stage 1 through stage 4 chronic kidney disease, or unspecified chronic kidney disease: Secondary | ICD-10-CM | POA: Diagnosis not present

## 2020-12-04 DIAGNOSIS — K219 Gastro-esophageal reflux disease without esophagitis: Secondary | ICD-10-CM | POA: Diagnosis not present

## 2020-12-04 DIAGNOSIS — Z820 Family history of epilepsy and other diseases of the nervous system: Secondary | ICD-10-CM | POA: Diagnosis not present

## 2020-12-04 DIAGNOSIS — J189 Pneumonia, unspecified organism: Secondary | ICD-10-CM | POA: Diagnosis present

## 2020-12-04 DIAGNOSIS — Z8249 Family history of ischemic heart disease and other diseases of the circulatory system: Secondary | ICD-10-CM | POA: Diagnosis not present

## 2020-12-04 DIAGNOSIS — Z96653 Presence of artificial knee joint, bilateral: Secondary | ICD-10-CM | POA: Diagnosis not present

## 2020-12-04 DIAGNOSIS — M1990 Unspecified osteoarthritis, unspecified site: Secondary | ICD-10-CM | POA: Diagnosis not present

## 2020-12-04 DIAGNOSIS — I700 Atherosclerosis of aorta: Secondary | ICD-10-CM | POA: Diagnosis not present

## 2020-12-04 DIAGNOSIS — Z7951 Long term (current) use of inhaled steroids: Secondary | ICD-10-CM

## 2020-12-04 DIAGNOSIS — J4531 Mild persistent asthma with (acute) exacerbation: Secondary | ICD-10-CM | POA: Diagnosis present

## 2020-12-04 DIAGNOSIS — N184 Chronic kidney disease, stage 4 (severe): Secondary | ICD-10-CM

## 2020-12-04 DIAGNOSIS — E7800 Pure hypercholesterolemia, unspecified: Secondary | ICD-10-CM | POA: Diagnosis present

## 2020-12-04 DIAGNOSIS — K5900 Constipation, unspecified: Secondary | ICD-10-CM

## 2020-12-04 DIAGNOSIS — I10 Essential (primary) hypertension: Secondary | ICD-10-CM | POA: Diagnosis not present

## 2020-12-04 DIAGNOSIS — I5031 Acute diastolic (congestive) heart failure: Secondary | ICD-10-CM

## 2020-12-04 DIAGNOSIS — E1122 Type 2 diabetes mellitus with diabetic chronic kidney disease: Secondary | ICD-10-CM | POA: Diagnosis not present

## 2020-12-04 DIAGNOSIS — Z8041 Family history of malignant neoplasm of ovary: Secondary | ICD-10-CM

## 2020-12-04 DIAGNOSIS — J90 Pleural effusion, not elsewhere classified: Secondary | ICD-10-CM | POA: Diagnosis not present

## 2020-12-04 DIAGNOSIS — J9611 Chronic respiratory failure with hypoxia: Secondary | ICD-10-CM | POA: Diagnosis not present

## 2020-12-04 DIAGNOSIS — R6889 Other general symptoms and signs: Secondary | ICD-10-CM | POA: Diagnosis not present

## 2020-12-04 DIAGNOSIS — R739 Hyperglycemia, unspecified: Secondary | ICD-10-CM | POA: Diagnosis not present

## 2020-12-04 DIAGNOSIS — I2721 Secondary pulmonary arterial hypertension: Secondary | ICD-10-CM | POA: Diagnosis present

## 2020-12-04 DIAGNOSIS — Z7189 Other specified counseling: Secondary | ICD-10-CM | POA: Diagnosis not present

## 2020-12-04 DIAGNOSIS — Z8042 Family history of malignant neoplasm of prostate: Secondary | ICD-10-CM | POA: Diagnosis not present

## 2020-12-04 DIAGNOSIS — R0602 Shortness of breath: Secondary | ICD-10-CM | POA: Diagnosis not present

## 2020-12-04 DIAGNOSIS — J9601 Acute respiratory failure with hypoxia: Secondary | ICD-10-CM | POA: Diagnosis present

## 2020-12-04 DIAGNOSIS — K449 Diaphragmatic hernia without obstruction or gangrene: Secondary | ICD-10-CM | POA: Diagnosis present

## 2020-12-04 DIAGNOSIS — Z79899 Other long term (current) drug therapy: Secondary | ICD-10-CM

## 2020-12-04 DIAGNOSIS — E039 Hypothyroidism, unspecified: Secondary | ICD-10-CM

## 2020-12-04 DIAGNOSIS — Z886 Allergy status to analgesic agent status: Secondary | ICD-10-CM

## 2020-12-04 DIAGNOSIS — Z9981 Dependence on supplemental oxygen: Secondary | ICD-10-CM

## 2020-12-04 DIAGNOSIS — E785 Hyperlipidemia, unspecified: Secondary | ICD-10-CM | POA: Diagnosis not present

## 2020-12-04 DIAGNOSIS — J9621 Acute and chronic respiratory failure with hypoxia: Secondary | ICD-10-CM | POA: Diagnosis not present

## 2020-12-04 DIAGNOSIS — E441 Mild protein-calorie malnutrition: Secondary | ICD-10-CM | POA: Diagnosis not present

## 2020-12-04 DIAGNOSIS — I517 Cardiomegaly: Secondary | ICD-10-CM | POA: Diagnosis not present

## 2020-12-04 DIAGNOSIS — F32A Depression, unspecified: Secondary | ICD-10-CM | POA: Diagnosis not present

## 2020-12-04 DIAGNOSIS — E119 Type 2 diabetes mellitus without complications: Secondary | ICD-10-CM

## 2020-12-04 DIAGNOSIS — R0603 Acute respiratory distress: Secondary | ICD-10-CM

## 2020-12-04 DIAGNOSIS — Z6841 Body Mass Index (BMI) 40.0 and over, adult: Secondary | ICD-10-CM | POA: Diagnosis not present

## 2020-12-04 DIAGNOSIS — Z66 Do not resuscitate: Secondary | ICD-10-CM | POA: Diagnosis not present

## 2020-12-04 DIAGNOSIS — Z888 Allergy status to other drugs, medicaments and biological substances status: Secondary | ICD-10-CM

## 2020-12-04 DIAGNOSIS — D649 Anemia, unspecified: Secondary | ICD-10-CM | POA: Diagnosis not present

## 2020-12-04 DIAGNOSIS — I5033 Acute on chronic diastolic (congestive) heart failure: Secondary | ICD-10-CM

## 2020-12-04 DIAGNOSIS — Z743 Need for continuous supervision: Secondary | ICD-10-CM | POA: Diagnosis not present

## 2020-12-04 DIAGNOSIS — D638 Anemia in other chronic diseases classified elsewhere: Secondary | ICD-10-CM | POA: Diagnosis not present

## 2020-12-04 DIAGNOSIS — I7091 Generalized atherosclerosis: Secondary | ICD-10-CM | POA: Diagnosis not present

## 2020-12-04 DIAGNOSIS — J811 Chronic pulmonary edema: Secondary | ICD-10-CM | POA: Diagnosis not present

## 2020-12-04 DIAGNOSIS — Z79890 Hormone replacement therapy (postmenopausal): Secondary | ICD-10-CM

## 2020-12-04 DIAGNOSIS — R61 Generalized hyperhidrosis: Secondary | ICD-10-CM | POA: Diagnosis not present

## 2020-12-04 DIAGNOSIS — Z833 Family history of diabetes mellitus: Secondary | ICD-10-CM

## 2020-12-04 LAB — ECHOCARDIOGRAM LIMITED
Area-P 1/2: 3.19 cm2
Height: 59 in
Weight: 3584 oz

## 2020-12-04 LAB — HEMOGLOBIN A1C
Hgb A1c MFr Bld: 5.8 % — ABNORMAL HIGH (ref 4.8–5.6)
Mean Plasma Glucose: 119.76 mg/dL

## 2020-12-04 LAB — BASIC METABOLIC PANEL
Anion gap: 9 (ref 5–15)
BUN: 59 mg/dL — ABNORMAL HIGH (ref 8–23)
CO2: 31 mmol/L (ref 22–32)
Calcium: 9 mg/dL (ref 8.9–10.3)
Chloride: 95 mmol/L — ABNORMAL LOW (ref 98–111)
Creatinine, Ser: 3.56 mg/dL — ABNORMAL HIGH (ref 0.44–1.00)
GFR, Estimated: 13 mL/min — ABNORMAL LOW (ref >60)
Glucose, Bld: 149 mg/dL — ABNORMAL HIGH (ref 70–99)
Potassium: 3.7 mmol/L (ref 3.5–5.1)
Sodium: 135 mmol/L (ref 135–145)

## 2020-12-04 LAB — CBC WITH DIFFERENTIAL/PLATELET
Abs Immature Granulocytes: 0.05 10*3/uL (ref 0.00–0.07)
Basophils Absolute: 0 10*3/uL (ref 0.0–0.1)
Basophils Relative: 0 %
Eosinophils Absolute: 0.1 10*3/uL (ref 0.0–0.5)
Eosinophils Relative: 2 %
HCT: 25.8 % — ABNORMAL LOW (ref 36.0–46.0)
Hemoglobin: 7.6 g/dL — ABNORMAL LOW (ref 12.0–15.0)
Immature Granulocytes: 1 %
Lymphocytes Relative: 5 %
Lymphs Abs: 0.5 10*3/uL — ABNORMAL LOW (ref 0.7–4.0)
MCH: 27.4 pg (ref 26.0–34.0)
MCHC: 29.5 g/dL — ABNORMAL LOW (ref 30.0–36.0)
MCV: 93.1 fL (ref 80.0–100.0)
Monocytes Absolute: 0.7 10*3/uL (ref 0.1–1.0)
Monocytes Relative: 8 %
Neutro Abs: 8.2 10*3/uL — ABNORMAL HIGH (ref 1.7–7.7)
Neutrophils Relative %: 84 %
Platelets: 221 10*3/uL (ref 150–400)
RBC: 2.77 MIL/uL — ABNORMAL LOW (ref 3.87–5.11)
RDW: 16.5 % — ABNORMAL HIGH (ref 11.5–15.5)
WBC: 9.6 10*3/uL (ref 4.0–10.5)
nRBC: 0 % (ref 0.0–0.2)

## 2020-12-04 LAB — HEMOGLOBIN AND HEMATOCRIT, BLOOD
HCT: 26.1 % — ABNORMAL LOW (ref 36.0–46.0)
Hemoglobin: 7.5 g/dL — ABNORMAL LOW (ref 12.0–15.0)

## 2020-12-04 LAB — RESP PANEL BY RT-PCR (FLU A&B, COVID) ARPGX2
Influenza A by PCR: NEGATIVE
Influenza B by PCR: NEGATIVE
SARS Coronavirus 2 by RT PCR: NEGATIVE

## 2020-12-04 LAB — GLUCOSE, CAPILLARY
Glucose-Capillary: 162 mg/dL — ABNORMAL HIGH (ref 70–99)
Glucose-Capillary: 236 mg/dL — ABNORMAL HIGH (ref 70–99)

## 2020-12-04 LAB — I-STAT VENOUS BLOOD GAS, ED
Acid-Base Excess: 9 mmol/L — ABNORMAL HIGH (ref 0.0–2.0)
Bicarbonate: 35.7 mmol/L — ABNORMAL HIGH (ref 20.0–28.0)
Calcium, Ion: 1.16 mmol/L (ref 1.15–1.40)
HCT: 25 % — ABNORMAL LOW (ref 36.0–46.0)
Hemoglobin: 8.5 g/dL — ABNORMAL LOW (ref 12.0–15.0)
O2 Saturation: 72 %
Potassium: 4.1 mmol/L (ref 3.5–5.1)
Sodium: 137 mmol/L (ref 135–145)
TCO2: 38 mmol/L — ABNORMAL HIGH (ref 22–32)
pCO2, Ven: 60.3 mmHg — ABNORMAL HIGH (ref 44.0–60.0)
pH, Ven: 7.381 (ref 7.250–7.430)
pO2, Ven: 40 mmHg (ref 32.0–45.0)

## 2020-12-04 LAB — CBG MONITORING, ED: Glucose-Capillary: 170 mg/dL — ABNORMAL HIGH (ref 70–99)

## 2020-12-04 LAB — TROPONIN I (HIGH SENSITIVITY)
Troponin I (High Sensitivity): 17 ng/L (ref <18)
Troponin I (High Sensitivity): 20 ng/L — ABNORMAL HIGH (ref <18)

## 2020-12-04 LAB — TSH: TSH: 0.238 u[IU]/mL — ABNORMAL LOW (ref 0.350–4.500)

## 2020-12-04 LAB — BRAIN NATRIURETIC PEPTIDE: B Natriuretic Peptide: 169 pg/mL — ABNORMAL HIGH (ref 0.0–100.0)

## 2020-12-04 LAB — PROCALCITONIN: Procalcitonin: 0.2 ng/mL

## 2020-12-04 LAB — MAGNESIUM: Magnesium: 1.9 mg/dL (ref 1.7–2.4)

## 2020-12-04 MED ORDER — ESCITALOPRAM OXALATE 20 MG PO TABS
20.0000 mg | ORAL_TABLET | Freq: Every day | ORAL | Status: DC
  Administered 2020-12-04 – 2020-12-07 (×4): 20 mg via ORAL
  Filled 2020-12-04 (×4): qty 1

## 2020-12-04 MED ORDER — METOPROLOL SUCCINATE ER 50 MG PO TB24
50.0000 mg | ORAL_TABLET | Freq: Every morning | ORAL | Status: DC
  Administered 2020-12-04 – 2020-12-07 (×4): 50 mg via ORAL
  Filled 2020-12-04: qty 1
  Filled 2020-12-04: qty 2
  Filled 2020-12-04 (×2): qty 1

## 2020-12-04 MED ORDER — HYDRALAZINE HCL 25 MG PO TABS
37.5000 mg | ORAL_TABLET | Freq: Three times a day (TID) | ORAL | Status: DC
  Administered 2020-12-04 – 2020-12-07 (×12): 37.5 mg via ORAL
  Filled 2020-12-04 (×12): qty 2

## 2020-12-04 MED ORDER — B COMPLEX-C PO TABS
1.0000 | ORAL_TABLET | Freq: Every day | ORAL | Status: DC
  Administered 2020-12-04 – 2020-12-07 (×4): 1 via ORAL
  Filled 2020-12-04 (×4): qty 1

## 2020-12-04 MED ORDER — LORAZEPAM 0.5 MG PO TABS
0.5000 mg | ORAL_TABLET | Freq: Every day | ORAL | Status: DC
  Administered 2020-12-04 – 2020-12-07 (×4): 0.5 mg via ORAL
  Filled 2020-12-04 (×4): qty 1

## 2020-12-04 MED ORDER — PREDNISONE 20 MG PO TABS
40.0000 mg | ORAL_TABLET | Freq: Every day | ORAL | Status: DC
  Administered 2020-12-05 – 2020-12-07 (×3): 40 mg via ORAL
  Filled 2020-12-04 (×3): qty 2

## 2020-12-04 MED ORDER — GABAPENTIN 100 MG PO CAPS
100.0000 mg | ORAL_CAPSULE | Freq: Two times a day (BID) | ORAL | Status: DC
  Administered 2020-12-04 – 2020-12-07 (×8): 100 mg via ORAL
  Filled 2020-12-04 (×8): qty 1

## 2020-12-04 MED ORDER — RISAQUAD PO CAPS
1.0000 | ORAL_CAPSULE | Freq: Two times a day (BID) | ORAL | Status: DC
  Administered 2020-12-04 – 2020-12-07 (×7): 1 via ORAL
  Filled 2020-12-04 (×7): qty 1

## 2020-12-04 MED ORDER — SUCRALFATE 1 G PO TABS
1.0000 g | ORAL_TABLET | Freq: Every day | ORAL | Status: DC
  Administered 2020-12-04 – 2020-12-07 (×4): 1 g via ORAL
  Filled 2020-12-04 (×4): qty 1

## 2020-12-04 MED ORDER — TORSEMIDE 20 MG PO TABS: 40.0000 mg | ORAL_TABLET | Freq: Every morning | ORAL | Status: DC

## 2020-12-04 MED ORDER — POLYVINYL ALCOHOL 1.4 % OP SOLN
1.0000 [drp] | Freq: Two times a day (BID) | OPHTHALMIC | Status: DC | PRN
  Administered 2020-12-04: 1 [drp] via OPHTHALMIC
  Filled 2020-12-04: qty 15

## 2020-12-04 MED ORDER — GUAIFENESIN ER 600 MG PO TB12
600.0000 mg | ORAL_TABLET | Freq: Two times a day (BID) | ORAL | Status: DC
  Administered 2020-12-04 – 2020-12-07 (×8): 600 mg via ORAL
  Filled 2020-12-04 (×8): qty 1

## 2020-12-04 MED ORDER — BUDESONIDE 0.5 MG/2ML IN SUSP
0.5000 mg | Freq: Two times a day (BID) | RESPIRATORY_TRACT | Status: DC
  Administered 2020-12-04 – 2020-12-07 (×7): 0.5 mg via RESPIRATORY_TRACT
  Filled 2020-12-04 (×7): qty 2

## 2020-12-04 MED ORDER — METHYLPREDNISOLONE SODIUM SUCC 125 MG IJ SOLR
125.0000 mg | Freq: Once | INTRAMUSCULAR | Status: AC
  Administered 2020-12-04: 125 mg via INTRAVENOUS
  Filled 2020-12-04: qty 2

## 2020-12-04 MED ORDER — SODIUM CHLORIDE 0.9 % IV SOLN
500.0000 mg | Freq: Once | INTRAVENOUS | Status: AC
  Administered 2020-12-04: 500 mg via INTRAVENOUS
  Filled 2020-12-04: qty 500

## 2020-12-04 MED ORDER — INSULIN ASPART 100 UNIT/ML IJ SOLN
0.0000 [IU] | Freq: Three times a day (TID) | INTRAMUSCULAR | Status: DC
  Administered 2020-12-04 – 2020-12-05 (×3): 1 [IU] via SUBCUTANEOUS
  Administered 2020-12-05: 3 [IU] via SUBCUTANEOUS
  Administered 2020-12-06 – 2020-12-07 (×3): 1 [IU] via SUBCUTANEOUS
  Administered 2020-12-07: 2 [IU] via SUBCUTANEOUS

## 2020-12-04 MED ORDER — FERROUS GLUCONATE 324 (38 FE) MG PO TABS: 324.0000 mg | ORAL_TABLET | ORAL | Status: DC

## 2020-12-04 MED ORDER — IPRATROPIUM-ALBUTEROL 0.5-2.5 (3) MG/3ML IN SOLN
3.0000 mL | Freq: Once | RESPIRATORY_TRACT | Status: AC
  Administered 2020-12-04: 3 mL via RESPIRATORY_TRACT
  Filled 2020-12-04: qty 3

## 2020-12-04 MED ORDER — POLYVINYL ALCOHOL 1.4 % OP SOLN
1.0000 [drp] | Freq: Two times a day (BID) | OPHTHALMIC | Status: DC
  Administered 2020-12-04 – 2020-12-07 (×7): 1 [drp] via OPHTHALMIC
  Filled 2020-12-04: qty 15

## 2020-12-04 MED ORDER — CALCITRIOL 0.25 MCG PO CAPS
0.2500 ug | ORAL_CAPSULE | Freq: Every day | ORAL | Status: DC
  Administered 2020-12-04 – 2020-12-07 (×4): 0.25 ug via ORAL
  Filled 2020-12-04 (×4): qty 1

## 2020-12-04 MED ORDER — SODIUM CHLORIDE 0.9 % IV SOLN
2.0000 g | INTRAVENOUS | Status: DC
  Administered 2020-12-05 – 2020-12-07 (×3): 2 g via INTRAVENOUS
  Filled 2020-12-04 (×2): qty 2
  Filled 2020-12-04: qty 20
  Filled 2020-12-04: qty 2

## 2020-12-04 MED ORDER — DILTIAZEM HCL ER COATED BEADS 180 MG PO CP24
360.0000 mg | ORAL_CAPSULE | Freq: Every day | ORAL | Status: DC
  Administered 2020-12-04 – 2020-12-07 (×4): 360 mg via ORAL
  Filled 2020-12-04: qty 2
  Filled 2020-12-04: qty 1
  Filled 2020-12-04 (×2): qty 2
  Filled 2020-12-04: qty 1

## 2020-12-04 MED ORDER — INSULIN GLARGINE 100 UNIT/ML ~~LOC~~ SOLN
10.0000 [IU] | Freq: Every day | SUBCUTANEOUS | Status: DC
  Administered 2020-12-04 – 2020-12-07 (×4): 10 [IU] via SUBCUTANEOUS
  Filled 2020-12-04 (×5): qty 0.1

## 2020-12-04 MED ORDER — LEVOTHYROXINE SODIUM 100 MCG PO TABS
200.0000 ug | ORAL_TABLET | Freq: Every day | ORAL | Status: DC
  Administered 2020-12-05 – 2020-12-07 (×3): 200 ug via ORAL
  Filled 2020-12-04 (×3): qty 2

## 2020-12-04 MED ORDER — ACETAMINOPHEN 325 MG PO TABS
650.0000 mg | ORAL_TABLET | Freq: Four times a day (QID) | ORAL | Status: DC | PRN
  Administered 2020-12-04 – 2020-12-05 (×3): 650 mg via ORAL
  Filled 2020-12-04 (×4): qty 2

## 2020-12-04 MED ORDER — METHYLPREDNISOLONE SODIUM SUCC 125 MG IJ SOLR
60.0000 mg | Freq: Once | INTRAMUSCULAR | Status: AC
  Administered 2020-12-04: 60 mg via INTRAVENOUS
  Filled 2020-12-04: qty 2

## 2020-12-04 MED ORDER — SODIUM CHLORIDE 0.9 % IV SOLN
1.0000 g | Freq: Once | INTRAVENOUS | Status: AC
  Administered 2020-12-04: 1 g via INTRAVENOUS
  Filled 2020-12-04: qty 10

## 2020-12-04 MED ORDER — POLYETHYLENE GLYCOL 3350 17 G PO PACK
17.0000 g | PACK | ORAL | Status: DC
  Administered 2020-12-06: 17 g via ORAL
  Filled 2020-12-04 (×2): qty 1

## 2020-12-04 MED ORDER — SALINE SPRAY 0.65 % NA SOLN
1.0000 | NASAL | Status: DC | PRN
  Administered 2020-12-06: 1 via NASAL
  Filled 2020-12-04: qty 44

## 2020-12-04 MED ORDER — FUROSEMIDE 10 MG/ML IJ SOLN
40.0000 mg | Freq: Two times a day (BID) | INTRAMUSCULAR | Status: DC
  Administered 2020-12-04 – 2020-12-05 (×3): 40 mg via INTRAVENOUS
  Filled 2020-12-04 (×3): qty 4

## 2020-12-04 MED ORDER — AZITHROMYCIN 250 MG PO TABS
500.0000 mg | ORAL_TABLET | Freq: Every day | ORAL | Status: DC
  Administered 2020-12-05 – 2020-12-07 (×3): 500 mg via ORAL
  Filled 2020-12-04 (×3): qty 2

## 2020-12-04 MED ORDER — ACETAMINOPHEN 650 MG RE SUPP: 650.0000 mg | Freq: Four times a day (QID) | RECTAL | Status: DC | PRN

## 2020-12-04 MED ORDER — MONTELUKAST SODIUM 10 MG PO TABS
10.0000 mg | ORAL_TABLET | Freq: Every day | ORAL | Status: DC
  Administered 2020-12-04 – 2020-12-07 (×4): 10 mg via ORAL
  Filled 2020-12-04 (×4): qty 1

## 2020-12-04 MED ORDER — AZELASTINE HCL 0.1 % NA SOLN
1.0000 | Freq: Two times a day (BID) | NASAL | Status: DC
  Administered 2020-12-04 – 2020-12-07 (×7): 1 via NASAL
  Filled 2020-12-04: qty 30

## 2020-12-04 MED ORDER — CLONIDINE HCL 0.1 MG PO TABS
0.1000 mg | ORAL_TABLET | Freq: Every day | ORAL | Status: DC
  Administered 2020-12-04 – 2020-12-07 (×4): 0.1 mg via ORAL
  Filled 2020-12-04 (×4): qty 1

## 2020-12-04 MED ORDER — ARFORMOTEROL TARTRATE 15 MCG/2ML IN NEBU
15.0000 ug | INHALATION_SOLUTION | Freq: Two times a day (BID) | RESPIRATORY_TRACT | Status: DC
  Administered 2020-12-04 – 2020-12-07 (×7): 15 ug via RESPIRATORY_TRACT
  Filled 2020-12-04 (×7): qty 2

## 2020-12-04 MED ORDER — IPRATROPIUM-ALBUTEROL 0.5-2.5 (3) MG/3ML IN SOLN
3.0000 mL | Freq: Four times a day (QID) | RESPIRATORY_TRACT | Status: DC
  Administered 2020-12-04 (×3): 3 mL via RESPIRATORY_TRACT
  Filled 2020-12-04 (×3): qty 3

## 2020-12-04 MED ORDER — LORAZEPAM 0.5 MG PO TABS
0.2500 mg | ORAL_TABLET | Freq: Two times a day (BID) | ORAL | Status: DC
  Administered 2020-12-05 – 2020-12-07 (×6): 0.25 mg via ORAL
  Filled 2020-12-04 (×6): qty 1

## 2020-12-04 MED ORDER — ATORVASTATIN CALCIUM 80 MG PO TABS
80.0000 mg | ORAL_TABLET | Freq: Every day | ORAL | Status: DC
  Administered 2020-12-04 – 2020-12-07 (×4): 80 mg via ORAL
  Filled 2020-12-04 (×4): qty 1

## 2020-12-04 MED ORDER — PALIPERIDONE ER 3 MG PO TB24
3.0000 mg | ORAL_TABLET | Freq: Every day | ORAL | Status: DC
  Administered 2020-12-04 – 2020-12-07 (×4): 3 mg via ORAL
  Filled 2020-12-04 (×4): qty 1

## 2020-12-04 MED ORDER — IPRATROPIUM-ALBUTEROL 0.5-2.5 (3) MG/3ML IN SOLN
3.0000 mL | Freq: Four times a day (QID) | RESPIRATORY_TRACT | Status: DC
  Administered 2020-12-05 (×4): 3 mL via RESPIRATORY_TRACT
  Filled 2020-12-04 (×4): qty 3

## 2020-12-04 MED ORDER — DIALYVITE 3000 3 MG PO TABS: 1.0000 | ORAL_TABLET | Freq: Every day | ORAL | Status: DC

## 2020-12-04 MED ORDER — LORAZEPAM 0.5 MG PO TABS: 0.7500 mg | ORAL_TABLET | Freq: Every day | ORAL | Status: DC

## 2020-12-04 MED ORDER — PANTOPRAZOLE SODIUM 40 MG PO TBEC
40.0000 mg | DELAYED_RELEASE_TABLET | Freq: Every day | ORAL | Status: DC
  Administered 2020-12-04 – 2020-12-07 (×4): 40 mg via ORAL
  Filled 2020-12-04 (×4): qty 1

## 2020-12-04 NOTE — Progress Notes (Signed)
[facility] [id number] [facility]) Hospital Liaison note:  This patient is currently enrolled in [facility] outpatient-based Palliative Care. Will continue to follow for disposition.  Please call with any outpatient palliative questions or concerns.  Thank you, Lorelee Market, LPN [facility] Liaison [phone]

## 2020-12-04 NOTE — Consult Note (Signed)
Consultation Note Date: 12/04/2020   Patient Name: Amanda Cain  DOB: [birth year]  MRN: [id number]  Age / Sex: 75 y.o., female  PCP: Mayra Neer, MD Referring Physician: Norval Morton, MD  Reason for Consultation: Establishing goals of care  HPI/Patient Profile: 75 y.o. female  with past medical history of diastolic CHF last EF 83-47% with grade 2 diastolic dysfunction, chronic respiratory failure on 2 L, asthma, pulmonary hypertension, bronchitis, hypertension, hyperlipidemia, diabetes mellitus type 2, CKD stage IV, chronic anemia and hypothyroidism admitted on 12/04/2020 with shortness of breath and acute on chronic respiratory failure secondary to pneumonia.  Pt presents from [facility] and is being admitted for third hospitalization in the past 6 months. Palliative care has been consulted to assist with goals of care conversation.  Clinical Assessment and Goals of Care:  I have reviewed medical records including EPIC notes, labs and imaging, assessed the patient and met at the bedside to discuss diagnosis prognosis, GOC, EOL wishes, disposition and options.  I introduced Palliative Medicine as specialized medical care for people living with serious illness. It focuses on providing relief from the symptoms and stress of a serious illness. The goal is to improve quality of life for both the patient and the family.  We discussed a brief life review of the patient and then focused on their current illness. Amanda Cain is retired and leans on 3 of her sisters for support (Amanda Cain, [place], and Renfrow). She has 4 other siblings as well but she is not close with them. She has no children and has never been married. She states that her life has been "not that good" lately and her quality of life could be better. She is greatly burdened by her symptoms of CHF and asthma, particularly over the past year. She  confirms a visit with outpatient palliative care last week and reflects that she has been reconsidering her goals and decisions in light of her current illness. She laments that she has been in and out of the hospital and nursing homes for "quite some time" and she would much rather return home to her apartment. We discussed the difficulty in arranging 24/7 care at home and she acknowledges this barrier. She previously had an aid that assisted her 7 days a week, but upon contacting [facility] she was told this person no longer worked with the agency. She is willing to meet with her sisters to further discuss her goals, treatment options, and disposition preferences.  I attempted to elicit values and goals of care important to the patient.   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 She states her goal is that "God lets me live another year." She hopes for continued improvement of her symptoms during this hospitalization.   Advanced directives, concepts specific to code status, artifical feeding and hydration, and rehospitalization were considered and discussed. Griffin confirms that she is a full code, although she is reconsidering this decision and would like to discuss further in a family meeting. She is appropriately concerned  that this would be more harmful than beneficial in her current state. Counseled on the risks of cardiopulmonary resuscitation and the unfortunately very limited chance of success. Informed that cardiopulmonary resuscitation would not improve her health even if successful, rather it would return her to her previous state of health with high likelihood of causing additional suffering.  Hospice services outpatient were explained and offered. She has previous experience with a family member enrolled in hospice and she is willing to discuss further. She would prefer to go home upon discharge and she would consider hospice if this helps her achieve that goal.  Discussed the  importance of continued conversation with family and the medical providers regarding overall plan of care and treatment options, ensuring decisions are within the context of the patient's values and GOCs. I then called patient's sister Amanda Cain to schedule a [place] for 7/10 at [place]. She confirms patient's strong preference to return home and shares her concern that patient cannot live along. Family is also unable to provide the extensive care she needs at this time.  Questions and concerns were addressed. The family was encouraged to call with questions or concerns.  PMT will continue to support holistically.   PATIENT is the primary decision maker. Next of kin are the 34 of her 7 siblings.    SUMMARY OF RECOMMENDATIONS   -Continue full code/full scope treatment for now -Patient is reconsidering code status, she wishes to discuss further with her 3 sisters. [place] meeting scheduled for 7/10 at [place] and emotional support provided -Ongoing support from [place]: Full code  Palliative Prophylaxis:  Bowel Regimen, Delirium Protocol, and Turn Reposition  Additional Recommendations (Limitations, Scope, Preferences): Full Scope Treatment  Psycho-social/Spiritual:  Desire for further Chaplaincy support:yes Additional Recommendations: Caregiving  Support/Resources, Education on Hospice, and Referral to [employer]   Prognosis:  < 6 months given advanced age, functional decline, multiple hospitalizations for exacerbations of chronic conditions, multiple comorbidities  Discharge Planning: To Be Determined      Primary Diagnoses: Present on Admission:  Acute respiratory failure with hypoxia (HCC)  Normocytic anemia  Constipation  Morbid obesity (HCC)  Hyperlipidemia  GERD (gastroesophageal reflux disease)  Hypothyroidism, acquired  Essential hypertension  Acute on chronic respiratory failure with hypoxia (HCC)  CKD (chronic  kidney disease), stage IV (HCC)  Acute on chronic diastolic CHF (congestive heart failure) ([place])   I have reviewed the medical record, interviewed the patient and family, and examined the patient. The following aspects are pertinent.  Past Medical History:  Diagnosis Date   Anemia    Anxiety    severe   Aortic atherosclerosis ([place]) 10/24/2020   Arthritis    Bronchitis    hx of   CHF (congestive heart failure) (HCC)    Chronic kidney disease    "kidney disease stage 3"   Depression    Diabetes mellitus    GERD (gastroesophageal reflux disease)    Hx of gallstones    Hypercholesteremia    Hypertension    Hypothyroidism    Obesity    Social History   Socioeconomic History   Marital status: Single    Spouse name: Not on file   Number of children: Not on file   Years of education: Not on file   Highest education level: Not on file  Occupational History   Not on file  Tobacco Use   Smoking status: Never   Smokeless tobacco: Never  Vaping Use   Vaping Use:  Never used  Substance and Sexual Activity   Alcohol use: No   Drug use: No   Sexual activity: Never  Other Topics Concern   Not on file  Social History Narrative   Not on file   Social Determinants of Health   Financial Resource Strain: Not on file  Food Insecurity: Not on file  Transportation Needs: Not on file  Physical Activity: Not on file  Stress: Not on file  Social Connections: Not on file   Family History  Problem Relation Age of Onset   Alzheimer's disease Mother    Hypertension Mother    Stroke Father    Hypertension Father    Stroke Brother    Prostate cancer Brother    Diabetes Brother    Ovarian cancer Sister    Diabetes Sister    Breast cancer Neg Hx    Scheduled Meds:  arformoterol  15 mcg Nebulization BID   atorvastatin  80 mg Oral QHS   azelastine  1 spray Each Nare BID   [START ON 12/05/2020] azithromycin  500 mg Oral Daily   B-complex with vitamin C  1 tablet Oral Daily    budesonide (PULMICORT) nebulizer solution  0.5 mg Nebulization BID   calcitRIOL  0.25 mcg Oral Daily   cloNIDine  0.1 mg Oral Daily   diltiazem  360 mg Oral Daily   furosemide  40 mg Intravenous BID   gabapentin  100 mg Oral BID   guaiFENesin  600 mg Oral BID   hydrALAZINE  37.5 mg Oral TID   insulin aspart  0-6 Units Subcutaneous TID WC   insulin glargine  10 Units Subcutaneous Daily   ipratropium-albuterol  3 mL Nebulization QID   [START ON 12/05/2020] levothyroxine  200 mcg Oral QAC breakfast   methylPREDNISolone (SOLU-MEDROL) injection  60 mg Intravenous Once   metoprolol succinate  50 mg Oral q morning   montelukast  10 mg Oral QHS   paliperidone  3 mg Oral QHS   [START ON 12/05/2020] pantoprazole  40 mg Oral QAC breakfast   polyethylene glycol  17 g Oral QODAY   polyvinyl alcohol  1 drop Both Eyes BID   [START ON 12/05/2020] predniSONE  40 mg Oral Q breakfast   Probiotic   Oral BID   sucralfate  1 g Oral Daily   Continuous Infusions:  [START ON 12/05/2020] cefTRIAXone (ROCEPHIN)  IV     PRN Meds:.acetaminophen **OR** acetaminophen Medications Prior to Admission:  Prior to Admission medications   Medication Sig Start Date End Date Taking? Authorizing Provider  acetaminophen (TYLENOL) 500 MG tablet Take 500 mg by mouth in the morning.   Yes [provider]  albuterol (PROVENTIL HFA;VENTOLIN HFA) 108 (90 Base) MCG/ACT inhaler Inhale 2 puffs into the lungs every 4 (four) hours as needed for wheezing or shortness of breath.   Yes [provider]  albuterol (PROVENTIL) (2.5 MG/3ML) 0.083% nebulizer solution Take 3 mLs (2.5 mg total) by nebulization every 4 (four) hours as needed for wheezing or shortness of breath. 10/27/20  Yes Ghimire, Henreitta Leber, MD  atorvastatin (LIPITOR) 80 MG tablet Take 80 mg by mouth at bedtime. 03/20/19  Yes [provider]  azelastine (ASTELIN) 0.1 % nasal spray Place 1 spray into both nostrils 2 (two) times daily.   Yes [provider]  benzonatate (TESSALON) 100 MG capsule Take 1 capsule (100 mg total) by mouth 3 (three) times daily as needed for cough. 10/27/20  Yes Ghimire, Henreitta Leber, MD  Biotin 1000 MCG tablet Take 1,000 mcg by mouth daily.   Yes [provider]  calcitRIOL (ROCALTROL) 0.25 MCG capsule Take 0.25 mcg by mouth daily.   Yes [provider]  cloNIDine (CATAPRES) 0.1 MG tablet Take 0.1 mg by mouth daily.   Yes [provider]  diltiazem (CARDIZEM CD) 360 MG 24 hr capsule Take 360 mg by mouth daily.  05/16/16  Yes [provider]  Epoetin Alfa-epbx (RETACRIT IJ) Inject 15,000 Units as directed every 14 (fourteen) days.   Yes [provider]  escitalopram (LEXAPRO) 20 MG tablet Take 20 mg by mouth at bedtime.  09/02/19  Yes [provider]  estradiol (ESTRACE) 0.5 MG tablet Take 0.5 mg by mouth daily.   Yes [provider]  ferrous gluconate (FERGON) 324 MG tablet Take 324 mg by mouth every Monday, Wednesday, and Friday.   Yes [provider]  fluticasone-salmeterol (ADVAIR) 500-50 MCG/ACT AEPB Inhale 1 puff into the lungs in the morning and at bedtime.   Yes [provider]  folic acid-vitamin b complex-vitamin c-selenium-zinc (DIALYVITE) 3 MG TABS tablet Take 1 tablet by mouth daily.   Yes [provider]  gabapentin (NEURONTIN) 100 MG capsule Take 100 mg by mouth 2 (two) times daily. 09/17/20  Yes [provider]  hydrALAZINE (APRESOLINE) 25 MG tablet Take 1 tablet (25 mg total) by mouth every 8 (eight) hours. Patient taking differently: Take 37.5 mg by mouth 3 (three) times daily. 12/19/17 12/04/20 Yes Elodia Florence., MD  insulin degludec (TRESIBA FLEXTOUCH) 100 UNIT/ML FlexTouch Pen Inject 10 Units into the skin daily. 08/13/20  Yes Renato Shin, MD  insulin lispro (HUMALOG) 100 UNIT/ML injection Inject 1-5 Units into the skin 3 (three) times daily before meals.   Yes [provider]   ipratropium-albuterol (DUONEB) 0.5-2.5 (3) MG/3ML SOLN Take 3 mLs by nebulization every 8 (eight) hours. 10/27/20  Yes Ghimire, Henreitta Leber, MD  levothyroxine (SYNTHROID) 200 MCG tablet Take 200 mcg by mouth daily before breakfast. 08/04/19  Yes [provider]  LORazepam (ATIVAN) 0.5 MG tablet Take 0.5-1 tablets (0.25-0.5 mg total) by mouth See admin instructions. Take 0.25 mg by mouth two times a day and 0.5 mg at bedtime Patient taking differently: Take 0.75 mg by mouth at bedtime. 10/27/20  Yes Ghimire, Henreitta Leber, MD  METAMUCIL FIBER PO Take 3.4 g by mouth daily.   Yes [provider]  metoprolol succinate (TOPROL-XL) 50 MG 24 hr tablet Take 1 tablet (50 mg total) by mouth every morning. Take with or immediately following a meal. 10/01/20  Yes Vann, Jessica U, DO  montelukast (SINGULAIR) 10 MG tablet Take 1 tablet (10 mg total) by mouth at bedtime. TAKE 1 TABLET(10 MG) BY MOUTH AT BEDTIME Patient taking differently: Take 10 mg by mouth at bedtime. 08/18/20  Yes Mannam, Praveen, MD  paliperidone (INVEGA) 3 MG 24 hr tablet Take 3 mg by mouth at bedtime.   Yes [provider]  pantoprazole (PROTONIX) 40 MG tablet TAKE 1 TABLET(40 MG) BY MOUTH DAILY Patient taking differently: Take 40 mg by mouth daily before breakfast. 07/06/20  Yes Mannam, Praveen, MD  polyethylene glycol (MIRALAX / GLYCOLAX) 17 g packet Take 17 g by mouth every other day.   Yes [provider]  polyvinyl alcohol (LIQUIFILM TEARS) 1.4 % ophthalmic solution Place 1 drop into both eyes 2 (two) times daily.   Yes [provider]  Probiotic Product (PROBIOTIC PO) Take 1 capsule by mouth 2 (two)  times daily.   Yes [provider]  sucralfate (CARAFATE) 1 G tablet Take 1 g by mouth daily. 12/09/14  Yes [provider]  torsemide (DEMADEX) 20 MG tablet Take 40 mg by mouth in the morning.   Yes [provider]  vitamin B-12 (CYANOCOBALAMIN) 1000 MCG tablet Take 1,000 mcg by  mouth daily.   Yes [provider]  vitamin C (ASCORBIC ACID) 500 MG tablet Take 500 mg by mouth daily.   Yes [provider]  glucose blood (ONETOUCH VERIO) test strip 1 each by Other route 2 (two) times daily. and lancets 2/day 07/02/20   Renato Shin, MD  insulin aspart (NOVOLOG) 100 UNIT/ML injection Inject 0-6 Units into the skin 3 (three) times daily with meals. 0-6 Units, Subcutaneous, 3 times daily with meals CBG < 70: Implement Hypoglycemia measures CBG 70 - 120: 0 units CBG 121 - 150: 0 units CBG 151 - 200: 1 unit CBG 201-250: 2 units CBG 251-300: 3 units CBG 301-350: 4 units CBG 351-400: 5 units CBG > 400: Give 6 units and call MD Patient not taking: No sig reported 10/27/20 12/04/20  Jonetta Osgood, MD   Allergies  Allergen Reactions   Ace Inhibitors Other (See Comments) and Cough    "Allergic," per MAR   Amlodipine Other (See Comments)    "Dizziness," per patient AND "Allergic," per the [facility]   Aspirin Other (See Comments)    "I have an ulcer," per the patient AND flagged as "Allergic," per the [facility]   Review of Systems  Constitutional:  Positive for fatigue.  Respiratory:  Positive for shortness of breath.   All other systems reviewed and are negative.  Physical Exam Vitals and nursing note reviewed.  Constitutional:      General: She is not in acute distress.    Appearance: She is obese. She is ill-appearing.     Interventions: Nasal cannula in place.  Cardiovascular:     Rate and Rhythm: Normal rate.  Pulmonary:     Effort: Tachypnea present.  Neurological:     Mental Status: She is alert and oriented to person, place, and time.  Psychiatric:        Mood and Affect: Mood normal.    Vital Signs: BP (!) 136/47   Pulse 64   Temp (!) 97.5 F (36.4 C) (Oral)   Resp 16   Ht 4' 11"  (1.499 m)   Wt 101.6 kg   SpO2 97%   BMI 45.24 kg/m  Pain Scale: 0-10   Pain Score: 0-No pain   SpO2: SpO2: 97 % O2 Device:SpO2: 97 % O2 Flow Rate: .O2  Flow Rate (L/min): 2 L/min  IO: Intake/output summary: No intake or output data in the 24 hours ending 12/04/20 1250  LBM:   Baseline Weight: Weight: 101.6 kg Most recent weight: Weight: 101.6 kg     Palliative Assessment/Data:      Time In: 1200 Time Out: 1310 Time Total: 70 Minutes Greater than 50% of this time was spent in counseling and coordinating care related to the above assessment and plan.  Dorthy Cooler, PA-C Palliative Medicine Team Team phone # [phone]  Thank you for allowing the Palliative Medicine Team to assist in the care of this patient. Please utilize secure chat with additional questions, if there is no response within 30 minutes please call the above phone number.  Palliative Medicine Team providers are available by phone from 7am to 7pm daily and can be reached through the team  cell phone.  Should this patient require assistance outside of these hours, please call the patient's attending physician.

## 2020-12-04 NOTE — Progress Notes (Signed)
Echocardiogram 2D Echocardiogram has been performed.  [place] Deputy Jerold Yoss RDCS 12/04/2020, 11:22 AM

## 2020-12-04 NOTE — ED Notes (Signed)
Sister [name] updated per pt request, will see upstairs.
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
## 2020-12-04 NOTE — ED Triage Notes (Signed)
BIB [facility] from [place] for sob. Was fine at 0300. Sudden onset at 0600. C/o sob and was diaphoretic. Was in 51s on [place], 80s on mask, and improved to 98-100% on CPAP. Other VSS. NSR, HR 65. NSL 22g R hand. A&Ox4. H/o DM, CHF, HTN. Cbg was 156.

## 2020-12-04 NOTE — ED Notes (Signed)
Stuck pt X2 cant get it in her LAC pt does not want to be stuck in the hand. However RAC has medication going through the IV
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
## 2020-12-04 NOTE — ED Notes (Signed)
Dr. Roslynn Amble finished at [facility]. Orders received and initiated. Neb initiated. RT at [facility]. pCXR at [facility].
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
## 2020-12-05 ENCOUNTER — Inpatient Hospital Stay (HOSPITAL_COMMUNITY): Payer: Medicare Other

## 2020-12-05 LAB — COMPREHENSIVE METABOLIC PANEL
ALT: 17 U/L (ref 0–44)
AST: 15 U/L (ref 15–41)
Albumin: 3.3 g/dL — ABNORMAL LOW (ref 3.5–5.0)
Alkaline Phosphatase: 50 U/L (ref 38–126)
Anion gap: 9 (ref 5–15)
BUN: 59 mg/dL — ABNORMAL HIGH (ref 8–23)
CO2: 30 mmol/L (ref 22–32)
Calcium: 9.3 mg/dL (ref 8.9–10.3)
Chloride: 97 mmol/L — ABNORMAL LOW (ref 98–111)
Creatinine, Ser: 3.38 mg/dL — ABNORMAL HIGH (ref 0.44–1.00)
GFR, Estimated: 13 mL/min — ABNORMAL LOW (ref >60)
Glucose, Bld: 166 mg/dL — ABNORMAL HIGH (ref 70–99)
Potassium: 4.1 mmol/L (ref 3.5–5.1)
Sodium: 136 mmol/L (ref 135–145)
Total Bilirubin: 0.9 mg/dL (ref 0.3–1.2)
Total Protein: 6 g/dL — ABNORMAL LOW (ref 6.5–8.1)

## 2020-12-05 LAB — GLUCOSE, CAPILLARY
Glucose-Capillary: 167 mg/dL — ABNORMAL HIGH (ref 70–99)
Glucose-Capillary: 264 mg/dL — ABNORMAL HIGH (ref 70–99)
Glucose-Capillary: 133 mg/dL — ABNORMAL HIGH (ref 70–99)
Glucose-Capillary: 165 mg/dL — ABNORMAL HIGH (ref 70–99)

## 2020-12-05 LAB — CBC
HCT: 24.7 % — ABNORMAL LOW (ref 36.0–46.0)
Hemoglobin: 7.7 g/dL — ABNORMAL LOW (ref 12.0–15.0)
MCH: 27.8 pg (ref 26.0–34.0)
MCHC: 31.2 g/dL (ref 30.0–36.0)
MCV: 89.2 fL (ref 80.0–100.0)
Platelets: 227 10*3/uL (ref 150–400)
RBC: 2.77 MIL/uL — ABNORMAL LOW (ref 3.87–5.11)
RDW: 16.5 % — ABNORMAL HIGH (ref 11.5–15.5)
WBC: 7.5 10*3/uL (ref 4.0–10.5)
nRBC: 0 % (ref 0.0–0.2)

## 2020-12-05 MED ORDER — HYDRALAZINE HCL 20 MG/ML IJ SOLN
10.0000 mg | Freq: Once | INTRAMUSCULAR | Status: AC
  Administered 2020-12-05: 10 mg via INTRAVENOUS
  Filled 2020-12-05: qty 1

## 2020-12-05 MED ORDER — IPRATROPIUM-ALBUTEROL 0.5-2.5 (3) MG/3ML IN SOLN
3.0000 mL | Freq: Four times a day (QID) | RESPIRATORY_TRACT | Status: DC
  Administered 2020-12-06 (×3): 3 mL via RESPIRATORY_TRACT
  Filled 2020-12-05 (×3): qty 3

## 2020-12-05 MED ORDER — TORSEMIDE 20 MG PO TABS
40.0000 mg | ORAL_TABLET | Freq: Every morning | ORAL | Status: DC
  Administered 2020-12-06 – 2020-12-07 (×2): 40 mg via ORAL
  Filled 2020-12-05 (×3): qty 2

## 2020-12-05 NOTE — Progress Notes (Signed)
Pt medicated for elevated b/p with 2nd dose of Hydralazine10mg  IV. Medication was effective. Current b/p 155/78. Will continue to monitor and tx as indicated.

## 2020-12-05 NOTE — Significant Event (Addendum)
Rapid Response Event Note   Reason for Call :  Decreased SpO2-83% on 4L [place]  Initial Focused Assessment:  Pt lying in bed with labored breathing, tachypneic. Pt is alert and oriented, denies chest pain but does c/o SOB. She says this happens when she needs her BP medicine. Lungs with exp wheezes t/o and crackles in bases. Skin warm/diaphoretic.   HR-85, BP-206/67, RR-24, SpO2-99% on 4L [place]   Interventions:  PCXR 10mg  hydralazine IV  Plan of Care:  Give hydralazine and monitor response. Await PCXR results and relay to MD. Continue to monitor pt closely. Call RRT if further assistance needed.    Event Summary:   MD Notified: Dr. Hal Hope notified by bedside RN Call [place]  Dillard Essex, RN

## 2020-12-05 NOTE — Evaluation (Signed)
Physical Therapy Evaluation Patient Details Name: Amanda Cain MRN: [id number] DOB: 01/01/1946 Today's Date: 12/05/2020   History of Present Illness  Pt is a 75 y.o. F who presents from [place] (d/c from [facility] 10/27/20) with increasing dyspnea, wheezing, and productive whitish sputum. Pt diagnosed with PNA while at SNF. She was admitted for the management of acute on chronic respiratory failure secondary to combination of asthma exacerbation along with CHF exacerbation/pneumonia PMH: CHF, CKD stage IV, T2DM, HLD, HTN, infected sebaceous cyst, morbid obesity, depression.  Clinical Impression   Pt admitted secondary to problem above with deficits below. PTA patient was residing at [facility] and walking with RW with assist. Her goal is to return home with intermittent assistance. Pt currently requires min assist for bed mobility with HOB elevated and minguard assist for ambulation (limited by dyspnea).  Anticipate patient will benefit from PT to address problems listed below.Will continue to follow acutely to maximize functional mobility independence and safety.       Follow Up Recommendations SNF    Equipment Recommendations  None recommended by PT    Recommendations for Other Services OT consult     Precautions / Restrictions Precautions Precautions: Fall      Mobility  Bed Mobility Overal bed mobility: Needs Assistance Bed Mobility: Supine to Sit     Supine to sit: HOB elevated;Min assist     General bed mobility comments: using rail and HOB elevated required min assist to raise torso and scoot to get feet to the floor    Transfers Overall transfer level: Needs assistance Equipment used: Rolling walker (2 wheeled) Transfers: Sit to/from Stand Sit to Stand: Min guard         General transfer comment: pt uses momentum to come to stand  Ambulation/Gait Ambulation/Gait assistance: Min guard Gait Distance (Feet): 2 Feet Assistive device: Rolling walker (2 wheeled) Gait  Pattern/deviations: Step-to pattern     General Gait Details: +dyspneic and only felt she could walk to chair from bed; very short steps due to situation  Stairs            Wheelchair Mobility    Modified Schnitzler (Stroke Patients Only)       Balance Overall balance assessment: Needs assistance Sitting-balance support: No upper extremity supported;Feet supported Sitting balance-Leahy Scale: Good     Standing balance support: Bilateral upper extremity supported Standing balance-Leahy Scale: Poor                               Pertinent Vitals/Pain Pain Assessment: No/denies pain    Home Living Family/patient expects to be discharged to:: Skilled nursing facility                 Additional Comments: was at home 2 hospitalizations ago; went to [place] and now at [place] with goal to return home    Prior Function Level of Independence: Needs assistance   Gait / Transfers Assistance Needed: walking with PT at [facility]; using RW           Hand Dominance   Dominant Hand: Right    Extremity/Trunk Assessment   Upper Extremity Assessment Upper Extremity Assessment: Generalized weakness    Lower Extremity Assessment Lower Extremity Assessment: Generalized weakness    Cervical / Trunk Assessment Cervical / Trunk Assessment: Other exceptions Cervical / Trunk Exceptions: obese  Communication   Communication: No difficulties  Cognition Arousal/Alertness: Awake/alert Behavior During Therapy: WFL for tasks assessed/performed Overall Cognitive  Status: Within Functional Limits for tasks assessed                                 General Comments: not specifically tested      General Comments General comments (skin integrity, edema, etc.): on 2L with sats >92% throughout    Exercises     Assessment/Plan    PT Assessment Patient needs continued PT services  PT Problem List Decreased strength;Decreased activity tolerance;Decreased  balance;Decreased mobility;Decreased knowledge of use of DME;Cardiopulmonary status limiting activity;Obesity       PT Treatment Interventions DME instruction;Gait training;Functional mobility training;Therapeutic activities;Therapeutic exercise;Balance training;Patient/family education    PT Goals (Current goals can be found in the Care Plan section)  Acute Rehab PT Goals Patient Stated Goal: to get well enough to go home PT Goal Formulation: With patient Time For Goal Achievement: 12/19/20 Potential to Achieve Goals: Good    Frequency Min 2X/week   Barriers to discharge Decreased caregiver support      Co-evaluation               AM-PAC PT "6 Clicks" Mobility  Outcome Measure Help needed turning from your back to your side while in a flat bed without using bedrails?: A Little Help needed moving from lying on your back to sitting on the side of a flat bed without using bedrails?: A Lot Help needed moving to and from a bed to a chair (including a wheelchair)?: A Little Help needed standing up from a chair using your arms (e.g., wheelchair or bedside chair)?: A Little Help needed to walk in hospital room?: Total Help needed climbing 3-5 steps with a railing? : Total 6 Click Score: 13    End of Session Equipment Utilized During Treatment: Oxygen Activity Tolerance: Patient limited by fatigue;Treatment limited secondary to medical complications (Comment) (dyspneic) Patient left: in chair;with call bell/phone within reach;with chair alarm set Nurse Communication: Mobility status PT Visit Diagnosis: Other abnormalities of gait and mobility (R26.89);Muscle weakness (generalized) (M62.81)    Time: 9604-5409 PT Time Calculation (min) (ACUTE ONLY): 19 min   Charges:   PT Evaluation $PT Eval Moderate Complexity: 1 Mod           Arby Barrette, PT Pager [phone]   Rexanne Mano 12/05/2020, 4:10 PM

## 2020-12-05 NOTE — Progress Notes (Signed)
PROGRESS NOTE    Amanda Cain  TLX:[id number] DOB: 05/07/1946 DOA: 12/04/2020 PCP: Mayra Neer, MD   Chief Complain: Shortness of breath  Brief Narrative: Patient is a 75 year old female with history of diastolic congestive heart failure with ejection fraction of 60-65% with grade 2 diastolic dysfunction, chronic respiratory failure on 2 L of oxygen at home, asthma, bronchitis, hypertension, hyperlipidemia, diabetes type 2, CKD stage IV, hypothyroidism who presented from skilled nursing facility with complaints of shortness of breath.  She was also reporting productive cough, wheezing.  On presentation she was hypoxic on room air, had to be started on BiPAP.  Lab work showed hemoglobin of 7.6, creatinine of 3.56.  Chest x-ray showed possible left lower lobe pneumonia, cardiomegaly with pulmonary vascular congestion without frank edema.  She was admitted for the management of acute on chronic respiratory failure secondary to combination of asthma exacerbation along with CHF exacerbation/pneumonia  Assessment & Plan:   Principal Problem:   Acute respiratory failure with hypoxia (HCC) Active Problems:   Diabetes (HCC)   Normocytic anemia   Essential hypertension   Hypothyroidism, acquired   Hyperlipidemia   GERD (gastroesophageal reflux disease)   CKD (chronic kidney disease), stage IV (HCC)   Acute on chronic respiratory failure with hypoxia (HCC)   Morbid obesity (HCC)   Exacerbation of asthma   Acute on chronic diastolic CHF (congestive heart failure) (HCC)   Constipation   Goals of care, counseling/discussion   Acute on chronic respiratory failure with hypoxia/hypercapnia: Multifactorial including pneumonia, asthma, CHF exacerbation.  She was coughing, wheezing at nursing facility.  Chest x-ray concerning for left-sided pneumonia with pleural effusion.  She had to be started on BiPAP on presentation but has been weaned off.  Follow-up sputum culture, other cultures.  Started  on Rocephin and azithromycin. She is on oxygen at 2 L at baseline  Mild persistent asthma with exacerbation: Presented with wheezes.  Follows with pulmonology, Dr. Vaughan Browner.  Started on Solu-Medrol.  Continue nebulization treatment, bronchodilators, Mucinex, Singulair  Acute on chronic diastolic congestive heart failure/pulmonary hypertension: Presented with bilateral lower extremity edema, elevated BNP.  Last echo had showed ejection fraction of 60 to 74%, grade 2 diastolic dysfunction.  She was also noted to have severely elevated left and right heart filling pressures, pulmonary hypertension.  New echo showed EF of 60 to 65%, indeterminate left ventricular diastolic parameters.   Currently she appears near euvolemic.  We will restart her home torsemide.  Normocytic anemia: Acute on chronic.  Hemoglobin was in the range of 7 on admission.  Baseline hemoglobin around 9-10.  Patient is on Retacrit injections every 14 days and her last injection was 7/7.  Stool guaiac will be obtained.  Patient reports dark stools for a while.  Continue to monitor H&H.  Constipation: Continue bowel regimen  Suspected dysphagia: Speech therapy followed her here and recommended regular solids with thin liquids.  Follow-up with speech as an outpatient.  Diabetes type 2: New A1c of 5.8.  Continue current insulin regimen.    Hypertension: Continue current blood pressure medications.  Monitor blood pressure  Chronic kidney disease stage IV: Patient creatinine around 3.4.  Follows with Dr. Carolin Sicks as an outpatient.  Hypothyroidism: Continue Synthyroid.  TSH of 0.2.  Will check T3 and T4  Hyperlipidemia: On Lipitor  GERD: On Protonix, Carafate   Morbid obesity: BMI 45  Goals of care: Elderly patient with multiple comorbidities.  Palliative care Consulted and following for goals of care.  Palliative care meeting tomorrow  Debility/deconditioning: Resides in a SNF.  She wants to go home when discharged.  Will  obtain PT/OT evaluation.            DVT prophylaxis:SCD Code Status: Full Family Communication: None at bedside Status is: Inpatient  Remains inpatient appropriate because:Inpatient level of care appropriate due to severity of illness  Dispo: The patient is from: SNF              Anticipated d/c is to: SNF              Patient currently is not medically stable to d/c.   Difficult to place patient No    Consultants: palliative care  Procedures:None  Antimicrobials:  Anti-infectives (From admission, onward)    Start     Dose/Rate Route Frequency Ordered Stop   12/05/20 1000  cefTRIAXone (ROCEPHIN) 2 g in sodium chloride 0.9 % 100 mL IVPB        2 g 200 mL/hr over 30 Minutes Intravenous Every 24 hours 12/04/20 0925 12/09/20 0959   12/05/20 1000  azithromycin (ZITHROMAX) tablet 500 mg        500 mg Oral Daily 12/04/20 0925 12/09/20 0959   12/04/20 0815  cefTRIAXone (ROCEPHIN) 1 g in sodium chloride 0.9 % 100 mL IVPB        1 g 200 mL/hr over 30 Minutes Intravenous  Once 12/04/20 0803 12/04/20 1137   12/04/20 0815  azithromycin (ZITHROMAX) 500 mg in sodium chloride 0.9 % 250 mL IVPB        500 mg 250 mL/hr over 60 Minutes Intravenous  Once 12/04/20 0803 12/04/20 1333       Subjective:  Patient seen and examined at the bedside this afternoon.  Hemodynamically stable.  Lying in bed.  She says she feels better.  Denies any worsening shortness of breath or cough.  She wants to go home when she is discharged.  She reported black  stools  Objective: Vitals:   12/05/20 0831 12/05/20 0913 12/05/20 1000 12/05/20 1200  BP: (!) 159/38 (!) 170/47 (!) 179/53 (!) 163/45  Pulse: 100   65  Resp: 19   (!) 22  Temp:    98.1 F (36.7 C)  TempSrc:    Oral  SpO2: 100%   99%  Weight:      Height:        Intake/Output Summary (Last 24 hours) at 12/05/2020 1413 Last data filed at 12/04/2020 2300 Gross per 24 hour  Intake 487 ml  Output 1050 ml  Net -563 ml   Filed Weights    12/04/20 0738 12/04/20 1505 12/05/20 0400  Weight: 101.6 kg 104.6 kg 104.2 kg    Examination:  General exam: Overall comfortable, not in distress, morbidly obese HEENT: PERRL Respiratory system: Bilateral scattered rhonchi  cardiovascular system: S1 & S2 heard, RRR.  Gastrointestinal system: Abdomen is nondistended, soft and nontender. Central nervous system: Alert and oriented Extremities: trace bilateral lower extremity edema, no clubbing ,no cyanosis Skin: No rashes, no ulcers,no icterus      Data Reviewed: I have personally reviewed following labs and imaging studies  CBC: Recent Labs  Lab 12/04/20 0718 12/04/20 0940 12/04/20 0949 12/05/20 0136  WBC 9.6  --   --  7.5  NEUTROABS 8.2*  --   --   --   HGB 7.6* 7.5* 8.5* 7.7*  HCT 25.8* 26.1* 25.0* 24.7*  MCV 93.1  --   --  89.2  PLT 221  --   --  227  Basic Metabolic Panel: Recent Labs  Lab 12/04/20 0718 12/04/20 0949 12/05/20 0136  NA 135 137 136  K 3.7 4.1 4.1  CL 95*  --  97*  CO2 31  --  30  GLUCOSE 149*  --  166*  BUN 59*  --  59*  CREATININE 3.56*  --  3.38*  CALCIUM 9.0  --  9.3  MG 1.9  --   --    GFR: Estimated Creatinine Clearance: 14.9 mL/min (A) (by C-G formula based on SCr of 3.38 mg/dL (H)). Liver Function Tests: Recent Labs  Lab 12/05/20 0136  AST 15  ALT 17  ALKPHOS 50  BILITOT 0.9  PROT 6.0*  ALBUMIN 3.3*   No results for input(s): LIPASE, AMYLASE in the last 168 hours. No results for input(s): AMMONIA in the last 168 hours. Coagulation Profile: No results for input(s): INR, PROTIME in the last 168 hours. Cardiac Enzymes: No results for input(s): CKTOTAL, CKMB, CKMBINDEX, TROPONINI in the last 168 hours. BNP (last 3 results) No results for input(s): PROBNP in the last 8760 hours. HbA1C: Recent Labs    12/04/20 1026  HGBA1C 5.8*   CBG: Recent Labs  Lab 12/04/20 1229 12/04/20 1513 12/04/20 2109 12/05/20 0737 12/05/20 1134  GLUCAP 170* 162* 236* 167* 264*   Lipid  Profile: No results for input(s): CHOL, HDL, LDLCALC, TRIG, CHOLHDL, LDLDIRECT in the last 72 hours. Thyroid Function Tests: Recent Labs    12/04/20 1030  TSH 0.238*   Anemia Panel: No results for input(s): VITAMINB12, FOLATE, FERRITIN, TIBC, IRON, RETICCTPCT in the last 72 hours. Sepsis Labs: Recent Labs  Lab 12/04/20 0940  PROCALCITON 0.20    Recent Results (from the past 240 hour(s))  Resp Panel by RT-PCR (Flu A&B, Covid) Nasopharyngeal Swab     Status: None   Collection Time: 12/04/20  7:53 AM   Specimen: Nasopharyngeal Swab; Nasopharyngeal(NP) swabs in vial transport medium  Result Value Ref Range Status   SARS Coronavirus 2 by RT PCR NEGATIVE NEGATIVE Final    Comment: (NOTE) SARS-CoV-2 target nucleic acids are NOT DETECTED.  The SARS-CoV-2 RNA is generally detectable in upper respiratory specimens during the acute phase of infection. The lowest concentration of SARS-CoV-2 viral copies this assay can detect is 138 copies/mL. A negative result does not preclude SARS-Cov-2 infection and should not be used as the sole basis for treatment or other patient management decisions. A negative result may occur with  improper specimen collection/handling, submission of specimen other than nasopharyngeal swab, presence of viral mutation(s) within the areas targeted by this assay, and inadequate number of viral copies(<138 copies/mL). A negative result must be combined with clinical observations, patient history, and epidemiological information. The expected result is Negative.  Fact Sheet for Patients:  EntrepreneurPulse.com.au  Fact Sheet for Healthcare Providers:  IncredibleEmployment.be  This test is no t yet approved or cleared by the [place] FDA and  has been authorized for detection and/or diagnosis of SARS-CoV-2 by FDA under an Emergency Use Authorization (EUA). This EUA will remain  in effect (meaning this test can be used)  for the duration of the COVID-19 declaration under Section 564(b)(1) of the Act, 21 U.S.C.section 360bbb-3(b)(1), unless the authorization is terminated  or revoked sooner.       Influenza A by PCR NEGATIVE NEGATIVE Final   Influenza B by PCR NEGATIVE NEGATIVE Final    Comment: (NOTE) The Xpert Xpress SARS-CoV-2/FLU/RSV plus assay is intended as an aid in the diagnosis of influenza from Nasopharyngeal swab  specimens and should not be used as a sole basis for treatment. Nasal washings and aspirates are unacceptable for Xpert Xpress SARS-CoV-2/FLU/RSV testing.  Fact Sheet for Patients: EntrepreneurPulse.com.au  Fact Sheet for Healthcare Providers: IncredibleEmployment.be  This test is not yet approved or cleared by the [place] FDA and has been authorized for detection and/or diagnosis of SARS-CoV-2 by FDA under an Emergency Use Authorization (EUA). This EUA will remain in effect (meaning this test can be used) for the duration of the COVID-19 declaration under Section 564(b)(1) of the Act, 21 U.S.C. section 360bbb-3(b)(1), unless the authorization is terminated or revoked.  Performed at [facility], [place] [address] [place], [place] [address]          Radiology Studies: DG Chest Port 1 View  Result Date: 12/05/2020 CLINICAL DATA:  Acute respiratory distress EXAM: PORTABLE CHEST 1 VIEW COMPARISON:  12/04/2020 FINDINGS: Chronic cardiomegaly. Stable interstitial prominence with subtle asymmetric left base density and small left pleural effusion. No acute osseous finding IMPRESSION: 1. Stable from yesterday. 2. Cardiomegaly. 3. Equivocal infiltrate at the left base Electronically Signed   By: Monte Fantasia M.D.   On: 12/05/2020 04:40   DG Chest Portable 1 View  Result Date: 12/04/2020 CLINICAL DATA:  75 year old female with history of shortness of breath since yesterday. EXAM: PORTABLE CHEST 1 VIEW COMPARISON:  Chest x-ray  10/25/2020. FINDINGS: Ill-defined opacities in the left mid to lower lung with obscuration of the left hemidiaphragm and blunting of the left costophrenic sulcus. Right lung is clear. No pneumothorax. Cephalization of the pulmonary vasculature, without frank pulmonary edema. Heart size is mildly enlarged. Upper mediastinal contours are within normal limits. Aortic atherosclerosis. IMPRESSION: 1. Probable left lower lobe pneumonia with small left parapneumonic pleural effusion. Followup PA and lateral chest X-ray is recommended in 3-4 weeks following trial of antibiotic therapy to ensure resolution and exclude underlying malignancy. 2. Mild cardiomegaly with pulmonary venous congestion, but no frank pulmonary edema. Electronically Signed   By: Vinnie Langton M.D.   On: 12/04/2020 07:46   DG Abd Portable 1V  Result Date: 12/04/2020 CLINICAL DATA:  Constipation. EXAM: PORTABLE ABDOMEN - 1 VIEW COMPARISON:  None. FINDINGS: No dilated large or small bowel. No access stool burden. No pathologic calcifications. Degenerative osteophytosis of the spine. IMPRESSION: 1. No evidence of bowel obstruction. 2. No excess stool burden. Electronically Signed   By: Suzy Bouchard M.D.   On: 12/04/2020 11:14   ECHOCARDIOGRAM LIMITED  Result Date: 12/04/2020    ECHOCARDIOGRAM LIMITED REPORT   Patient Name:   KYLANI WIRES Date of Exam: 12/04/2020 Medical Rec #:  [id number]          Height:       59.0 in Accession #:    [id number]         Weight:       224.0 lb Date of Birth:  1946/04/06           BSA:          1.935 m Patient Age:    24 years           BP:           141/48 mmHg Patient Gender: F                  HR:           65 bpm. Exam Location:  Inpatient Procedure: Limited Echo, Color Doppler and Cardiac Doppler Indications:    M46.80 Acute diastolic (  congestive) heart failure  History:        Patient has prior history of Echocardiogram examinations, most                 recent 02/21/2020. CHF, Pulmonary HTN; Risk  Factors:Hypertension,                 Diabetes and Dyslipidemia.  Sonographer:    Raquel Sarna Senior RDCS Referring Phys: [phone] [place]  1. Left ventricular ejection fraction, by estimation, is 60 to 65%. The left ventricle has normal function. The left ventricle has no regional wall motion abnormalities. There is mild left ventricular hypertrophy. Left ventricular diastolic parameters are indeterminate.  2. Right ventricular systolic function is normal. The right ventricular size is mildly enlarged. Tricuspid regurgitation signal is inadequate for assessing PA pressure.  3. The mitral valve is degenerative. No mitral regurgitation. Mild to moderate mitral stenosis. The mean mitral valve gradient is 5.0 mmHg at HR 66 bpm. MVA 1.3 cm^2 by continuity equation. Moderate mitral annular calcification.  4. The aortic valve was not well visualized. Aortic valve regurgitation is not visualized. Unable to assess for aortic stenosis, gradients across aortic valve were not measured  5. The inferior vena cava is normal in size with <50% respiratory variability, suggesting right atrial pressure of 8 mmHg. FINDINGS  Left Ventricle: Left ventricular ejection fraction, by estimation, is 60 to 65%. The left ventricle has normal function. The left ventricle has no regional wall motion abnormalities. The left ventricular internal cavity size was normal in size. There is  mild left ventricular hypertrophy. Left ventricular diastolic parameters are indeterminate. Right Ventricle: The right ventricular size is mildly enlarged. Right ventricular systolic function is normal. Tricuspid regurgitation signal is inadequate for assessing PA pressure. Pericardium: There is no evidence of pericardial effusion. Mitral Valve: The mitral valve is degenerative in appearance. Moderate mitral annular calcification. Mild to moderate mitral valve stenosis. MV peak gradient, 11.3 mmHg. The mean mitral valve gradient is 5.0 mmHg. Tricuspid  Valve: The tricuspid valve is normal in structure. Tricuspid valve regurgitation is not demonstrated. Aortic Valve: The aortic valve was not well visualized. Aortic valve regurgitation is not visualized. Venous: The inferior vena cava is normal in size with less than 50% respiratory variability, suggesting right atrial pressure of 8 mmHg.  Diastology LV e' medial:    7.94 cm/s LV E/e' medial:  20.2 LV e' lateral:   7.94 cm/s LV E/e' lateral: 20.2  AORTIC VALVE LVOT Vmax:   90.40 cm/s LVOT Vmean:  63.200 cm/s LVOT VTI:    0.222 m MITRAL VALVE MV Area (PHT): 3.19 cm     SHUNTS MV Peak grad:  11.3 mmHg    Systemic VTI: 0.22 m MV Mean grad:  5.0 mmHg MV Vmax:       1.68 m/s MV Vmean:      107.0 cm/s MV Decel Time: 238 msec MV E velocity: 160.00 cm/s MV A velocity: 130.00 cm/s MV E/A ratio:  1.23 Oswaldo Milian MD Electronically signed by Oswaldo Milian MD Signature Date/Time: 12/04/2020/4:06:30 PM    Final         Scheduled Meds:  acidophilus  1 capsule Oral BID   arformoterol  15 mcg Nebulization BID   atorvastatin  80 mg Oral QHS   azelastine  1 spray Each Nare BID   azithromycin  500 mg Oral Daily   B-complex with vitamin C  1 tablet Oral Daily   budesonide (PULMICORT) nebulizer solution  0.5 mg  Nebulization BID   calcitRIOL  0.25 mcg Oral Daily   cloNIDine  0.1 mg Oral Daily   diltiazem  360 mg Oral Daily   escitalopram  20 mg Oral QHS   furosemide  40 mg Intravenous BID   gabapentin  100 mg Oral BID   guaiFENesin  600 mg Oral BID   hydrALAZINE  37.5 mg Oral TID   insulin aspart  0-6 Units Subcutaneous TID WC   insulin glargine  10 Units Subcutaneous Daily   ipratropium-albuterol  3 mL Nebulization Q6H   levothyroxine  200 mcg Oral QAC breakfast   LORazepam  0.25 mg Oral BID WC   And   LORazepam  0.5 mg Oral QHS   metoprolol succinate  50 mg Oral q morning   montelukast  10 mg Oral QHS   paliperidone  3 mg Oral QHS   pantoprazole  40 mg Oral QAC breakfast   polyethylene  glycol  17 g Oral QODAY   polyvinyl alcohol  1 drop Both Eyes BID   predniSONE  40 mg Oral Q breakfast   sucralfate  1 g Oral Daily   Continuous Infusions:  cefTRIAXone (ROCEPHIN)  IV 2 g (12/05/20 0955)     LOS: 1 day    Time spent: More than 50% of that time was spent in counseling and/or coordination of care.      Shelly Coss, MD [facility] P7/01/2021, 2:13 PM

## 2020-12-05 NOTE — Progress Notes (Signed)
Pt has b/p of 201/68(map 119), spoke with on-call provider and rec;d order for Hydralazine 10mg  IV x1 dose. Will reassess b/p after medication and tx as needed.

## 2020-12-06 DIAGNOSIS — J9601 Acute respiratory failure with hypoxia: Secondary | ICD-10-CM

## 2020-12-06 DIAGNOSIS — D638 Anemia in other chronic diseases classified elsewhere: Secondary | ICD-10-CM

## 2020-12-06 DIAGNOSIS — D649 Anemia, unspecified: Secondary | ICD-10-CM

## 2020-12-06 LAB — BASIC METABOLIC PANEL
Anion gap: 11 (ref 5–15)
BUN: 74 mg/dL — ABNORMAL HIGH (ref 8–23)
CO2: 31 mmol/L (ref 22–32)
Calcium: 9.4 mg/dL (ref 8.9–10.3)
Chloride: 96 mmol/L — ABNORMAL LOW (ref 98–111)
Creatinine, Ser: 3.45 mg/dL — ABNORMAL HIGH (ref 0.44–1.00)
GFR, Estimated: 13 mL/min — ABNORMAL LOW (ref >60)
Glucose, Bld: 161 mg/dL — ABNORMAL HIGH (ref 70–99)
Potassium: 4 mmol/L (ref 3.5–5.1)
Sodium: 138 mmol/L (ref 135–145)

## 2020-12-06 LAB — CBC WITH DIFFERENTIAL/PLATELET
Abs Immature Granulocytes: 0.08 10*3/uL — ABNORMAL HIGH (ref 0.00–0.07)
Basophils Absolute: 0 10*3/uL (ref 0.0–0.1)
Basophils Relative: 0 %
Eosinophils Absolute: 0 10*3/uL (ref 0.0–0.5)
Eosinophils Relative: 0 %
HCT: 23.6 % — ABNORMAL LOW (ref 36.0–46.0)
Hemoglobin: 7.4 g/dL — ABNORMAL LOW (ref 12.0–15.0)
Immature Granulocytes: 1 %
Lymphocytes Relative: 4 %
Lymphs Abs: 0.3 10*3/uL — ABNORMAL LOW (ref 0.7–4.0)
MCH: 28.2 pg (ref 26.0–34.0)
MCHC: 31.4 g/dL (ref 30.0–36.0)
MCV: 90.1 fL (ref 80.0–100.0)
Monocytes Absolute: 0.6 10*3/uL (ref 0.1–1.0)
Monocytes Relative: 7 %
Neutro Abs: 7.5 10*3/uL (ref 1.7–7.7)
Neutrophils Relative %: 88 %
Platelets: 214 10*3/uL (ref 150–400)
RBC: 2.62 MIL/uL — ABNORMAL LOW (ref 3.87–5.11)
RDW: 17.1 % — ABNORMAL HIGH (ref 11.5–15.5)
WBC: 8.5 10*3/uL (ref 4.0–10.5)
nRBC: 0 % (ref 0.0–0.2)

## 2020-12-06 LAB — GLUCOSE, CAPILLARY
Glucose-Capillary: 121 mg/dL — ABNORMAL HIGH (ref 70–99)
Glucose-Capillary: 184 mg/dL — ABNORMAL HIGH (ref 70–99)
Glucose-Capillary: 186 mg/dL — ABNORMAL HIGH (ref 70–99)
Glucose-Capillary: 179 mg/dL — ABNORMAL HIGH (ref 70–99)

## 2020-12-06 LAB — IRON AND TIBC
Iron: 47 ug/dL (ref 28–170)
Saturation Ratios: 20 % (ref 10.4–31.8)
TIBC: 231 ug/dL — ABNORMAL LOW (ref 250–450)
UIBC: 184 ug/dL

## 2020-12-06 LAB — FERRITIN: Ferritin: 883 ng/mL — ABNORMAL HIGH (ref 11–307)

## 2020-12-06 LAB — T4, FREE: Free T4: 1.14 ng/dL — ABNORMAL HIGH (ref 0.61–1.12)

## 2020-12-06 MED ORDER — IPRATROPIUM-ALBUTEROL 0.5-2.5 (3) MG/3ML IN SOLN
3.0000 mL | Freq: Four times a day (QID) | RESPIRATORY_TRACT | Status: DC
  Administered 2020-12-07 (×4): 3 mL via RESPIRATORY_TRACT
  Filled 2020-12-06 (×4): qty 3
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
## 2020-12-06 NOTE — Progress Notes (Addendum)
PROGRESS NOTE    Amanda Cain  YCX:[id number] DOB: Oct 28, 1945 DOA: 12/04/2020 PCP: Mayra Neer, MD   Chief Complain: Shortness of breath  Brief Narrative: Patient is a 75 year old female with history of diastolic congestive heart failure with ejection fraction of 60-65% with grade 2 diastolic dysfunction, chronic respiratory failure on 2 L of oxygen at home, asthma, bronchitis, hypertension, hyperlipidemia, diabetes type 2, CKD stage IV, hypothyroidism who presented from skilled nursing facility with complaints of shortness of breath.  She was also reporting productive cough, wheezing.  On presentation she was hypoxic on room air, had to be started on BiPAP.  Lab work showed hemoglobin of 7.6, creatinine of 3.56.  Chest x-ray showed possible left lower lobe pneumonia, cardiomegaly with pulmonary vascular congestion without frank edema.  She was admitted for the management of acute on chronic respiratory failure secondary to combination of asthma exacerbation along with CHF exacerbation/pneumonia.  Assessment & Plan:   Principal Problem:   Acute respiratory failure with hypoxia (HCC) Active Problems:   Diabetes (HCC)   Normocytic anemia   Essential hypertension   Hypothyroidism, acquired   Hyperlipidemia   GERD (gastroesophageal reflux disease)   CKD (chronic kidney disease), stage IV (HCC)   Acute on chronic respiratory failure with hypoxia (HCC)   Morbid obesity (HCC)   Exacerbation of asthma   Acute on chronic diastolic CHF (congestive heart failure) (HCC)   Constipation   Goals of care, counseling/discussion   Acute on chronic respiratory failure with hypoxia/hypercapnia: Multifactorial including pneumonia, asthma, CHF exacerbation.  She was coughing, wheezing at nursing facility.  Chest x-ray concerning for left-sided pneumonia with pleural effusion.  She had to be started on BiPAP on presentation but has been weaned off.  Follow-up sputum culture, other cultures.  Started  on Rocephin and azithromycin. She is on oxygen at 2 L at baseline, and currently she is at baseline requirement.  Respiratory status has been stable  Mild persistent asthma with exacerbation: Presented with wheezes.  Follows with pulmonology, Dr. Vaughan Browner.  Started on Solu-Medrol.  Continue nebulization treatment, bronchodilators, Mucinex, Singulair.  Steroids will be changed to prednisone.  Acute on chronic diastolic congestive heart failure/pulmonary hypertension: Presented with bilateral lower extremity edema, elevated BNP.  Last echo had showed ejection fraction of 60 to 49%, grade 2 diastolic dysfunction.  She was also noted to have severely elevated left and right heart filling pressures, pulmonary hypertension.  New echo showed EF of 60 to 65%, indeterminate left ventricular diastolic parameters.   Currently she appears near euvolemic.  We restarted her home torsemide.  Normocytic anemia: Acute on chronic.  Hemoglobin was in the range of 7 on admission.  Baseline hemoglobin around 9-10.  Patient is on Retacrit injections every 14 days and her last injection was 7/7.  Stool guaiac was ordered, not on 28.  Patient reports dark stools for a while.  We have requested GI evaluation for consideration of EGD and colonoscopy  Constipation: Continue bowel regimen  Suspected dysphagia: Speech therapy followed her here and recommended regular solids with thin liquids.  Follow-up with speech as an outpatient.  Diabetes type 2: New A1c of 5.8.  Continue current insulin regimen.    Hypertension: Continue current blood pressure medications.  Monitor blood pressure  Chronic kidney disease stage IV: Patient creatinine around 3.4.  Follows with Dr. Carolin Sicks as an outpatient.  Hypothyroidism: She takes very high dose of 200 mcg of levothyroxine.  Thyroid panel showed hyperthyroid status with low TSH and high free T4.  We will hold levothyroxine for now.  She needs to have thyroid function test in about 4 weeks  and can resume levothyroxine if necessary.  Hyperlipidemia: On Lipitor  GERD: On Protonix, Carafate   Morbid obesity: BMI 45  Goals of care: Elderly patient with multiple comorbidities.  Palliative care Consulted and following for goals of care.  She is DNR.  Debility/deconditioning: Resides in a SNF.[facility]  Plan is to discharge to skilled nursing facility when she is ready.           DVT prophylaxis:SCD Code Status: Full Family Communication: Family members at bedside at bedside Status is: Inpatient  Remains inpatient appropriate because:Inpatient level of care appropriate due to severity of illness  Dispo: The patient is from: SNF              Anticipated d/c is to: SNF              Patient currently is not medically stable to d/c.   Difficult to place patient No    Consultants: palliative care  Procedures:None  Antimicrobials:  Anti-infectives (From admission, onward)    Start     Dose/Rate Route Frequency Ordered Stop   12/05/20 1000  cefTRIAXone (ROCEPHIN) 2 g in sodium chloride 0.9 % 100 mL IVPB        2 g 200 mL/hr over 30 Minutes Intravenous Every 24 hours 12/04/20 0925 12/09/20 0959   12/05/20 1000  azithromycin (ZITHROMAX) tablet 500 mg        500 mg Oral Daily 12/04/20 0925 12/09/20 0959   12/04/20 0815  cefTRIAXone (ROCEPHIN) 1 g in sodium chloride 0.9 % 100 mL IVPB        1 g 200 mL/hr over 30 Minutes Intravenous  Once 12/04/20 0803 12/04/20 1137   12/04/20 0815  azithromycin (ZITHROMAX) 500 mg in sodium chloride 0.9 % 250 mL IVPB        500 mg 250 mL/hr over 60 Minutes Intravenous  Once 12/04/20 0803 12/04/20 1333       Subjective:  Patient seen and examined the bedside this morning.  Hemodynamically stable.  She was on room air, sitting in the chair.  Denies shortness of breath or cough.  Family were at the bedside.  Objective: Vitals:   12/06/20 0800 12/06/20 0807 12/06/20 0809 12/06/20 0812  BP: (!) 148/29     Pulse: 62     Resp: 20      Temp: 98.2 F (36.8 C)     TempSrc: Oral     SpO2: 97% 99% 99% 100%  Weight:      Height:        Intake/Output Summary (Last 24 hours) at 12/06/2020 1141 Last data filed at 12/05/2020 1819 Gross per 24 hour  Intake 100 ml  Output --  Net 100 ml   Filed Weights   12/04/20 1505 12/05/20 0400 12/06/20 0600  Weight: 104.6 kg 104.2 kg 105.7 kg    Examination:  General exam: Overall comfortable, not in distress, morbidly obese HEENT: PERRL Respiratory system:  no wheezes or crackles  Cardiovascular system: S1 & S2 heard, RRR.  Gastrointestinal system: Abdomen is nondistended, soft and nontender. Central nervous system: Alert and oriented Extremities: No edema, no clubbing ,no cyanosis Skin: No rashes, no ulcers,no icterus        Data Reviewed: I have personally reviewed following labs and imaging studies  CBC: Recent Labs  Lab 12/04/20 0718 12/04/20 0940 12/04/20 0949 12/05/20 0136 12/06/20 0150  WBC 9.6  --   --  7.5 8.5  NEUTROABS 8.2*  --   --   --  7.5  HGB 7.6* 7.5* 8.5* 7.7* 7.4*  HCT 25.8* 26.1* 25.0* 24.7* 23.6*  MCV 93.1  --   --  89.2 90.1  PLT 221  --   --  227 709   Basic Metabolic Panel: Recent Labs  Lab 12/04/20 0718 12/04/20 0949 12/05/20 0136 12/06/20 0150  NA 135 137 136 138  K 3.7 4.1 4.1 4.0  CL 95*  --  97* 96*  CO2 31  --  30 31  GLUCOSE 149*  --  166* 161*  BUN 59*  --  59* 74*  CREATININE 3.56*  --  3.38* 3.45*  CALCIUM 9.0  --  9.3 9.4  MG 1.9  --   --   --    GFR: Estimated Creatinine Clearance: 14.7 mL/min (A) (by C-G formula based on SCr of 3.45 mg/dL (H)). Liver Function Tests: Recent Labs  Lab 12/05/20 0136  AST 15  ALT 17  ALKPHOS 50  BILITOT 0.9  PROT 6.0*  ALBUMIN 3.3*   No results for input(s): LIPASE, AMYLASE in the last 168 hours. No results for input(s): AMMONIA in the last 168 hours. Coagulation Profile: No results for input(s): INR, PROTIME in the last 168 hours. Cardiac Enzymes: No results for  input(s): CKTOTAL, CKMB, CKMBINDEX, TROPONINI in the last 168 hours. BNP (last 3 results) No results for input(s): PROBNP in the last 8760 hours. HbA1C: Recent Labs    12/04/20 1026  HGBA1C 5.8*   CBG: Recent Labs  Lab 12/05/20 0737 12/05/20 1134 12/05/20 1628 12/05/20 2141 12/06/20 0734  GLUCAP 167* 264* 133* 165* 121*   Lipid Profile: No results for input(s): CHOL, HDL, LDLCALC, TRIG, CHOLHDL, LDLDIRECT in the last 72 hours. Thyroid Function Tests: Recent Labs    12/04/20 1030 12/06/20 0150  TSH 0.238*  --   FREET4  --  1.14*   Anemia Panel: Recent Labs    12/06/20 0150  FERRITIN 883*  TIBC 231*  IRON 47   Sepsis Labs: Recent Labs  Lab 12/04/20 0940  PROCALCITON 0.20    Recent Results (from the past 240 hour(s))  Resp Panel by RT-PCR (Flu A&B, Covid) Nasopharyngeal Swab     Status: None   Collection Time: 12/04/20  7:53 AM   Specimen: Nasopharyngeal Swab; Nasopharyngeal(NP) swabs in vial transport medium  Result Value Ref Range Status   SARS Coronavirus 2 by RT PCR NEGATIVE NEGATIVE Final    Comment: (NOTE) SARS-CoV-2 target nucleic acids are NOT DETECTED.  The SARS-CoV-2 RNA is generally detectable in upper respiratory specimens during the acute phase of infection. The lowest concentration of SARS-CoV-2 viral copies this assay can detect is 138 copies/mL. A negative result does not preclude SARS-Cov-2 infection and should not be used as the sole basis for treatment or other patient management decisions. A negative result may occur with  improper specimen collection/handling, submission of specimen other than nasopharyngeal swab, presence of viral mutation(s) within the areas targeted by this assay, and inadequate number of viral copies(<138 copies/mL). A negative result must be combined with clinical observations, patient history, and epidemiological information. The expected result is Negative.  Fact Sheet for Patients:   EntrepreneurPulse.com.au  Fact Sheet for Healthcare Providers:  IncredibleEmployment.be  This test is no t yet approved or cleared by the [place] FDA and  has been authorized for detection and/or diagnosis of SARS-CoV-2 by FDA under an Emergency Use Authorization (EUA). This EUA will remain  in effect (meaning this test can be used) for the duration of the COVID-19 declaration under Section 564(b)(1) of the Act, 21 U.S.C.section 360bbb-3(b)(1), unless the authorization is terminated  or revoked sooner.       Influenza A by PCR NEGATIVE NEGATIVE Final   Influenza B by PCR NEGATIVE NEGATIVE Final    Comment: (NOTE) The Xpert Xpress SARS-CoV-2/FLU/RSV plus assay is intended as an aid in the diagnosis of influenza from Nasopharyngeal swab specimens and should not be used as a sole basis for treatment. Nasal washings and aspirates are unacceptable for Xpert Xpress SARS-CoV-2/FLU/RSV testing.  Fact Sheet for Patients: EntrepreneurPulse.com.au  Fact Sheet for Healthcare Providers: IncredibleEmployment.be  This test is not yet approved or cleared by the [place] FDA and has been authorized for detection and/or diagnosis of SARS-CoV-2 by FDA under an Emergency Use Authorization (EUA). This EUA will remain in effect (meaning this test can be used) for the duration of the COVID-19 declaration under Section 564(b)(1) of the Act, 21 U.S.C. section 360bbb-3(b)(1), unless the authorization is terminated or revoked.  Performed at [facility], [place] [address], [place], [place] [address]          Radiology Studies: DG Chest Port 1 View  Result Date: 12/05/2020 CLINICAL DATA:  Acute respiratory distress EXAM: PORTABLE CHEST 1 VIEW COMPARISON:  12/04/2020 FINDINGS: Chronic cardiomegaly. Stable interstitial prominence with subtle asymmetric left base density and small left pleural effusion. No acute  osseous finding IMPRESSION: 1. Stable from yesterday. 2. Cardiomegaly. 3. Equivocal infiltrate at the left base Electronically Signed   By: Monte Fantasia M.D.   On: 12/05/2020 04:40        Scheduled Meds:  acidophilus  1 capsule Oral BID   arformoterol  15 mcg Nebulization BID   atorvastatin  80 mg Oral QHS   azelastine  1 spray Each Nare BID   azithromycin  500 mg Oral Daily   B-complex with vitamin C  1 tablet Oral Daily   budesonide (PULMICORT) nebulizer solution  0.5 mg Nebulization BID   calcitRIOL  0.25 mcg Oral Daily   cloNIDine  0.1 mg Oral Daily   diltiazem  360 mg Oral Daily   escitalopram  20 mg Oral QHS   gabapentin  100 mg Oral BID   guaiFENesin  600 mg Oral BID   hydrALAZINE  37.5 mg Oral TID   insulin aspart  0-6 Units Subcutaneous TID WC   insulin glargine  10 Units Subcutaneous Daily   ipratropium-albuterol  3 mL Nebulization Q6H WA   levothyroxine  200 mcg Oral QAC breakfast   LORazepam  0.25 mg Oral BID WC   And   LORazepam  0.5 mg Oral QHS   metoprolol succinate  50 mg Oral q morning   montelukast  10 mg Oral QHS   paliperidone  3 mg Oral QHS   pantoprazole  40 mg Oral QAC breakfast   polyethylene glycol  17 g Oral QODAY   polyvinyl alcohol  1 drop Both Eyes BID   predniSONE  40 mg Oral Q breakfast   sucralfate  1 g Oral Daily   torsemide  40 mg Oral q AM   Continuous Infusions:  cefTRIAXone (ROCEPHIN)  IV 2 g (12/06/20 0852)     LOS: 2 days    Time spent: 25 mins.More than 50% of that time was spent in counseling and/or coordination of care.      Shelly Coss, MD [facility] P7/02/2021, 11:41 AM

## 2020-12-06 NOTE — NC FL2 (Signed)
[place] LEVEL OF CARE SCREENING TOOL     IDENTIFICATION  Patient Name: Amanda Cain Birthdate: [birth year] Sex: female Admission Date (Current Location): 12/04/2020  [facility] and [place] Number:  Herbalist and Address:  [facility], [place] [address], [place], [place] [address]      Provider Number: [id number]  Attending Physician Name and Address:  Shelly Coss, MD  Relative Name and Phone Number:  [name] [phone]    Current Level of Care: Hospital Recommended Level of Care: [place] Prior Approval Number:    Date Approved/Denied:   PASRR Number: [id number]  Discharge Plan: SNF    Current Diagnoses: Patient Active Problem List   Diagnosis Date Noted   Acute respiratory failure with hypoxia ([place]) 12/04/2020   Constipation 12/04/2020   Goals of care, counseling/discussion    Mild protein malnutrition ([place]) 10/24/2020   Class 3 obesity 10/24/2020   Aortic atherosclerosis ([place]) 10/24/2020   Dyspnea 10/23/2020   Acute CHF (congestive heart failure) ([place]) 09/24/2020   Acute exacerbation of CHF (congestive heart failure) ([place]) 04/16/2020   Acute on chronic diastolic CHF (congestive heart failure) ([place]) 02/20/2020   Hypokalemia    Anxiety    Physical deconditioning 03/13/2019   Acute recurrent maxillary sinusitis 03/13/2019   Pain due to onychomycosis of toenails of both feet 11/21/2018   Shortness of breath 08/09/2018   Exacerbation of asthma 06/19/2018   Chronic respiratory failure with hypoxia ([place]) 05/15/2018   Pulmonary hypertension ([place]) 05/14/2018   Atypical chest pain 05/12/2018   Community acquired pneumonia 12/13/2017   CAP (community acquired pneumonia) 12/13/2017   CHF (congestive heart failure) ([place]) 12/12/2017   Morbid obesity ([place]) 11/21/2017   Flu 07/07/2017   Influenza A 07/05/2017   Acute on chronic respiratory failure with hypoxia ([place]) 07/05/2017   Ascites     Acute on chronic diastolic heart failure ([place]) 11/17/2016   CKD (chronic kidney disease), stage IV ([place]) 11/17/2016   S/P laparoscopic cholecystectomy 03/02/2015   Hyperlipidemia 11/25/2012   Depression 11/25/2012   GERD (gastroesophageal reflux disease) 11/25/2012   Postoperative anemia due to acute blood loss 11/01/2012   Osteoarthritis of right knee 10/31/2012   Hyperglycemia 11/30/2011   SBP (spontaneous bacterial peritonitis) ([place]) 11/25/2011   Syncope and collapse 11/20/2011   Abdominal pain 11/19/2011   Hyponatremia 11/19/2011   Normocytic anemia 11/19/2011   Acute kidney injury superimposed on CKD ([place]) 11/19/2011   Moderate protein-calorie malnutrition ([place]) 11/19/2011   Essential hypertension 11/19/2011   Hypothyroidism, acquired 11/19/2011   Dyslipidemia 11/19/2011   Diabetes ([place]) 07/15/2011    Orientation RESPIRATION BLADDER Height & Weight     Self, Time, Situation, Place  O2 (2L [place]) Incontinent, External catheter Weight: 233 lb 0.4 oz (105.7 kg) Height:  4\' 11"  (149.9 cm)  BEHAVIORAL SYMPTOMS/MOOD NEUROLOGICAL BOWEL NUTRITION STATUS      Continent Diet (See dc summary)  AMBULATORY STATUS COMMUNICATION OF NEEDS Skin   Extensive Assist Verbally Normal                       Personal Care Assistance Level of Assistance  Bathing, Feeding, Dressing Bathing Assistance: Limited assistance Feeding assistance: Independent Dressing Assistance: Limited assistance     Functional Limitations Info  Sight, Hearing, Speech Sight Info: Adequate Hearing Info: Adequate Speech Info: Adequate    SPECIAL CARE FACTORS FREQUENCY  PT (By licensed PT), OT (By licensed OT)     PT Frequency: 5X  week OT Frequency: 5X week            Contractures Contractures Info: Not present    Additional Factors Info  Code Status, Allergies, Psychotropic, Insulin Sliding Scale Code Status Info: DNR Allergies Info: Ace Inhibitors   Amlodipine   Aspirin Psychotropic Info:  gabapentin (NEURONTIN) capsule 100 mg 2X daily, hydrALAZINE (APRESOLINE) tablet 37.5 mg 3X daily, Insulin Sliding Scale Info: insulin aspart (novoLOG) injection 0-6 Units 3x daily with meals, insulin glargine (LANTUS) injection 10 Units daily,       Current Medications (12/06/2020):  This is the current hospital active medication list Current Facility-Administered Medications  Medication Dose Route Frequency Provider Last Rate Last Admin   acetaminophen (TYLENOL) tablet 650 mg  650 mg Oral Q6H PRN Fuller Plan A, MD   650 mg at 12/05/20 1705   Or   acetaminophen (TYLENOL) suppository 650 mg  650 mg Rectal Q6H PRN Fuller Plan A, MD       acidophilus (RISAQUAD) capsule 1 capsule  1 capsule Oral BID Tamala Julian, Rondell A, MD   1 capsule at 12/06/20 0830   arformoterol (BROVANA) nebulizer solution 15 mcg  15 mcg Nebulization BID Fuller Plan A, MD   15 mcg at 12/06/20 0806   atorvastatin (LIPITOR) tablet 80 mg  80 mg Oral QHS Smith, Rondell A, MD   80 mg at 12/05/20 2217   azelastine (ASTELIN) 0.1 % nasal spray 1 spray  1 spray Each Nare BID Fuller Plan A, MD   1 spray at 12/06/20 0838   azithromycin (ZITHROMAX) tablet 500 mg  500 mg Oral Daily Fuller Plan A, MD   500 mg at 12/06/20 0981   B-complex with vitamin C tablet 1 tablet  1 tablet Oral Daily Fuller Plan A, MD   1 tablet at 12/06/20 0833   budesonide (PULMICORT) nebulizer solution 0.5 mg  0.5 mg Nebulization BID Fuller Plan A, MD   0.5 mg at 12/06/20 1914   calcitRIOL (ROCALTROL) capsule 0.25 mcg  0.25 mcg Oral Daily Tamala Julian, Rondell A, MD   0.25 mcg at 12/06/20 7829   cefTRIAXone (ROCEPHIN) 2 g in sodium chloride 0.9 % 100 mL IVPB  2 g Intravenous Q24H Tamala Julian, Rondell A, MD 200 mL/hr at 12/06/20 0852 2 g at 12/06/20 0852   cloNIDine (CATAPRES) tablet 0.1 mg  0.1 mg Oral Daily Tamala Julian, Rondell A, MD   0.1 mg at 12/06/20 0829   diltiazem (CARDIZEM CD) 24 hr capsule 360 mg  360 mg Oral Daily Tamala Julian, Rondell A, MD   360 mg at 12/06/20  0833   escitalopram (LEXAPRO) tablet 20 mg  20 mg Oral QHS Smith, Rondell A, MD   20 mg at 12/05/20 2218   gabapentin (NEURONTIN) capsule 100 mg  100 mg Oral BID Fuller Plan A, MD   100 mg at 12/06/20 0834   guaiFENesin (MUCINEX) 12 hr tablet 600 mg  600 mg Oral BID Fuller Plan A, MD   600 mg at 12/06/20 0830   hydrALAZINE (APRESOLINE) tablet 37.5 mg  37.5 mg Oral TID Fuller Plan A, MD   37.5 mg at 12/06/20 0834   insulin aspart (novoLOG) injection 0-6 Units  0-6 Units Subcutaneous TID WC Smith, Rondell A, MD   3 Units at 12/05/20 1215   insulin glargine (LANTUS) injection 10 Units  10 Units Subcutaneous Daily Fuller Plan A, MD   10 Units at 12/06/20 0836   ipratropium-albuterol (DUONEB) 0.5-2.5 (3) MG/3ML nebulizer solution 3 mL  3 mL Nebulization Q6H  WA Shelly Coss, MD   3 mL at 12/06/20 4259   levothyroxine (SYNTHROID) tablet 200 mcg  200 mcg Oral QAC breakfast Fuller Plan A, MD   200 mcg at 12/06/20 0604   LORazepam (ATIVAN) tablet 0.25 mg  0.25 mg Oral BID WC Pham, Minh Q, RPH-CPP   0.25 mg at 12/06/20 5638   And   LORazepam (ATIVAN) tablet 0.5 mg  0.5 mg Oral QHS Pham, Minh Q, RPH-CPP   0.5 mg at 12/05/20 2218   metoprolol succinate (TOPROL-XL) 24 hr tablet 50 mg  50 mg Oral q morning Tamala Julian, Rondell A, MD   50 mg at 12/06/20 0829   montelukast (SINGULAIR) tablet 10 mg  10 mg Oral QHS Smith, Rondell A, MD   10 mg at 12/05/20 2217   paliperidone (INVEGA) 24 hr tablet 3 mg  3 mg Oral QHS Smith, Rondell A, MD   3 mg at 12/05/20 2218   pantoprazole (PROTONIX) EC tablet 40 mg  40 mg Oral QAC breakfast Fuller Plan A, MD   40 mg at 12/06/20 0828   polyethylene glycol (MIRALAX / GLYCOLAX) packet 17 g  17 g Oral QODAY Smith, Rondell A, MD   17 g at 12/06/20 0827   polyvinyl alcohol (LIQUIFILM TEARS) 1.4 % ophthalmic solution 1 drop  1 drop Both Eyes BID Fuller Plan A, MD   1 drop at 12/06/20 0839   polyvinyl alcohol (LIQUIFILM TEARS) 1.4 % ophthalmic solution 1 drop  1 drop  Both Eyes BID PRN Fuller Plan A, MD   1 drop at 12/04/20 1727   predniSONE (DELTASONE) tablet 40 mg  40 mg Oral Q breakfast Smith, Rondell A, MD   40 mg at 12/06/20 0830   sodium chloride (OCEAN) 0.65 % nasal spray 1 spray  1 spray Each Nare PRN Fuller Plan A, MD   1 spray at 12/06/20 0837   sucralfate (CARAFATE) tablet 1 g  1 g Oral Daily Tamala Julian, Rondell A, MD   1 g at 12/06/20 0831   torsemide (DEMADEX) tablet 40 mg  40 mg Oral q AM Shelly Coss, MD   40 mg at 12/06/20 7564     Discharge Medications: Please see discharge summary for a list of discharge medications.  Relevant Imaging Results:  Relevant Lab Results:   Additional Information    Bron Snellings B Harlyn Italiano, LCSWA

## 2020-12-06 NOTE — TOC Initial Note (Signed)
Transition of Care [facility]) - Initial/Assessment Note    Patient Details  Name: Amanda Cain MRN: [id number] Date of Birth: [birth year]  Transition of Care [facility]) CM/SW Contact:    Coralee Pesa, [place] Phone Number: 12/06/2020, 10:46 AM  Clinical Narrative:                 CSW met with pt and sister's at bedside. Pt confirmed that she is from [place], but would prefer to go back to [facility]. CSW advised she would send a referral to [facility] but explained the barriers, and pt is in agreement to return to [place] if necessary. Pt's sister stated that the insurance has been trying to deny the authorization for SNF, and she has done several appeals. CSW went through the insurance process and barriers, and advised family to know what they will want to do if pt does not get approved to go back to SNF. We discussed HH and living arrangements vs LTC. CSW will send referrals out and start auth with updated PT note. SW will continue to follow for DC needs.  Expected Discharge Plan: [place] Barriers to Discharge: Insurance Authorization, SNF Pending bed offer, Continued Medical Work up   Patient Goals and CMS Choice Patient states their goals for this hospitalization and ongoing recovery are:: Pt and family wanting to go to [facility] for SNF. CMS Medicare.gov Compare Post Acute Care list provided to:: Patient Choice offered to / list presented to : Patient  Expected Discharge Plan and Services Expected Discharge Plan: [place] Choice: [place] Living arrangements for the past 2 months: [place], [place]                                      Prior Living Arrangements/Services Living arrangements for the past 2 months: [place], [place] Lives with:: Facility Resident, Self Patient language and need for interpreter reviewed:: Yes Do you feel safe  going back to the place where you live?: Yes      Need for Family Participation in Patient Care: Yes (Comment) Care giver support system in place?: Yes (comment)   Criminal Activity/Legal Involvement Pertinent to Current Situation/Hospitalization: No - Comment as needed  Activities of Daily Living      Permission Sought/Granted Permission sought to share information with : Family Supports Permission granted to share information with : Yes, Verbal Permission Granted  Share Information with NAME: [name]     Permission granted to share info w Relationship: Sister  Permission granted to share info w Contact Information: [phone]  Emotional Assessment Appearance:: Appears stated age Attitude/Demeanor/Rapport: Engaged Affect (typically observed): Appropriate Orientation: : Oriented to Self, Oriented to Place, Oriented to  Time, Oriented to Situation Alcohol / Substance Use: Not Applicable Psych Involvement: No (comment)  Admission diagnosis:  Acute respiratory failure with hypoxia ([place]) [J96.01] Constipation [K59.00] Community acquired pneumonia of left lower lobe of lung [J18.9] Exacerbation of asthma, unspecified asthma severity, unspecified whether persistent [J45.901] Patient Active Problem List   Diagnosis Date Noted   Acute respiratory failure with hypoxia ([place]) 12/04/2020   Constipation 12/04/2020   Goals of care, counseling/discussion    Mild protein malnutrition ([place]) 10/24/2020   Class 3 obesity 10/24/2020   Aortic atherosclerosis ([place]) 10/24/2020   Dyspnea 10/23/2020   Acute CHF (congestive heart failure) (  [place]) 09/24/2020   Acute exacerbation of CHF (congestive heart failure) ([place]) 04/16/2020   Acute on chronic diastolic CHF (congestive heart failure) ([place]) 02/20/2020   Hypokalemia    Anxiety    Physical deconditioning 03/13/2019   Acute recurrent maxillary sinusitis 03/13/2019   Pain due to onychomycosis of toenails of both feet 11/21/2018    Shortness of breath 08/09/2018   Exacerbation of asthma 06/19/2018   Chronic respiratory failure with hypoxia ([place]) 05/15/2018   Pulmonary hypertension ([place]) 05/14/2018   Atypical chest pain 05/12/2018   Community acquired pneumonia 12/13/2017   CAP (community acquired pneumonia) 12/13/2017   CHF (congestive heart failure) ([place]) 12/12/2017   Morbid obesity ([place]) 11/21/2017   Flu 07/07/2017   Influenza A 07/05/2017   Acute on chronic respiratory failure with hypoxia ([place]) 07/05/2017   Ascites    Acute on chronic diastolic heart failure ([place]) 11/17/2016   CKD (chronic kidney disease), stage IV ([place]) 11/17/2016   S/P laparoscopic cholecystectomy 03/02/2015   Hyperlipidemia 11/25/2012   Depression 11/25/2012   GERD (gastroesophageal reflux disease) 11/25/2012   Postoperative anemia due to acute blood loss 11/01/2012   Osteoarthritis of right knee 10/31/2012   Hyperglycemia 11/30/2011   SBP (spontaneous bacterial peritonitis) ([place]) 11/25/2011    Class: Acute   Syncope and collapse 11/20/2011   Abdominal pain 11/19/2011   Hyponatremia 11/19/2011   Normocytic anemia 11/19/2011   Acute kidney injury superimposed on CKD ([place]) 11/19/2011   Moderate protein-calorie malnutrition ([place]) 11/19/2011   Essential hypertension 11/19/2011   Hypothyroidism, acquired 11/19/2011   Dyslipidemia 11/19/2011   Diabetes ([place]) 07/15/2011   PCP:  Mayra Neer, MD Pharmacy:   [place], [place] - [address] [place] [address] Phone: [phone] Fax: [phone]  [id number] - [place], [place] - [address] [place] [address] Phone: [phone] Fax: [phone]     Social Determinants of Health (SDOH) Interventions    Readmission Risk Interventions Readmission Risk Prevention Plan 02/26/2020  Transportation Screening Complete  Medication Review ([place]) Complete   PCP or Specialist appointment within 3-5 days of discharge Complete  SW Recovery Care/Counseling Consult Complete  [place] Patient Refused  Some recent data might be hidden

## 2020-12-06 NOTE — Evaluation (Signed)
Occupational Therapy Evaluation Patient Details Name: Gerianne Cain MRN: [id number] DOB: [birth year] Today's Date: 12/06/2020    History of Present Illness Pt is a 75 y.o. F who presents from [facility] (d/c from [facility] 10/27/20) with increasing dyspnea, wheezing, and productive whitish sputum. Pt diagnosed with PNA while at SNF. She was admitted for the management of acute on chronic respiratory failure secondary to combination of asthma exacerbation along with CHF exacerbation/pneumonia PMH: CHF, CKD stage IV, T2DM, HLD, HTN, infected sebaceous cyst, morbid obesity, depression.   Clinical Impression   Pt pleasant and agreeable to session; on arrival O2 off with Sats reading as low as 82% on RA, nursing made aware. OT donned O2 via [place] with nearly immediate recovery >92%. Pt with fair insight, requiring cues for safety,  but endorsing motivation for participation with skilled OT services to improve safety and Indep with ADL's. Pt currently requiring Min guard up to min A for transition to and form standing and with ADL pivot transfer to simulate toileting. Increased A of at least min A for LB ADL's, and set up/sup seated UB ADL's. Anticipate pt to continue to benefit from post acute OT at time of d/c with OT to follow acutely.     Follow Up Recommendations  SNF    Equipment Recommendations       Recommendations for Other Services       Precautions / Restrictions Precautions Precautions: Fall      Mobility Bed Mobility                    Transfers Overall transfer level: Needs assistance Equipment used: Rolling walker (2 wheeled) Transfers: Sit to/from Stand;Stand Pivot Transfers Sit to Stand: Min guard Stand pivot transfers: Min assist       General transfer comment: increased time with min a    Balance Overall balance assessment: Needs assistance                                         ADL either performed or assessed with clinical judgement    ADL Overall ADL's : Needs assistance/impaired Eating/Feeding: Set up;Sitting   Grooming: Set up;Sitting           Upper Body Dressing : Minimal assistance;Sitting;Set up   Lower Body Dressing: Moderate assistance;Sit to/from stand   Toilet Transfer: Minimal assistance;RW   Toileting- Clothing Manipulation and Hygiene: Minimal assistance;Sit to/from stand               Vision Baseline Vision/History: Wears glasses Wears Glasses: At all times Vision Assessment?: No apparent visual deficits     Perception     Praxis      Pertinent Vitals/Pain Pain Assessment: No/denies pain     Hand Dominance Right   Extremity/Trunk Assessment Upper Extremity Assessment Upper Extremity Assessment: Generalized weakness   Lower Extremity Assessment Lower Extremity Assessment: Generalized weakness       Communication Communication Communication: No difficulties   Cognition Arousal/Alertness: Awake/alert Behavior During Therapy: WFL for tasks assessed/performed Overall Cognitive Status: Within Functional Limits for tasks assessed                                     General Comments  O2 off on arrival with sats dropping to 82% nursing aware, with replacement of O2 improved within 30  seconds to >94% without drop after placement even with movement/activity    Exercises     Shoulder Instructions      Home Living Family/patient expects to be discharged to:: Skilled nursing facility                                 Additional Comments: was at home 2 hospitalizations ago; went to [facility] and now at [facility] with goal to return home      Prior Functioning/Environment Level of Independence: Needs assistance    ADL's / Homemaking Assistance Needed: aid assists with cooking/IADL tasks and intermittent bathing/dressing as needed            OT Problem List: Decreased strength;Decreased activity tolerance;Impaired balance (sitting and/or  standing);Decreased safety awareness;Decreased knowledge of use of DME or AE;Decreased knowledge of precautions      OT Treatment/Interventions: Self-care/ADL training;Therapeutic exercise;Energy conservation;DME and/or AE instruction;Therapeutic activities;Patient/family education;Balance training    OT Goals(Current goals can be found in the care plan section) Acute Rehab OT Goals Patient Stated Goal: to eventually be able to get back home OT Goal Formulation: With patient Time For Goal Achievement: 12/20/20 Potential to Achieve Goals: Good ADL Goals Pt Will Perform Upper Body Dressing: with modified independence Pt Will Perform Lower Body Dressing: with modified independence Pt Will Transfer to Toilet: with modified independence  OT Frequency: Min 2X/week   Barriers to D/C:            Co-evaluation              AM-PAC OT "6 Clicks" Daily Activity     Outcome Measure Help from another person eating meals?: None Help from another person taking care of personal grooming?: None Help from another person toileting, which includes using toliet, bedpan, or urinal?: A Lot Help from another person bathing (including washing, rinsing, drying)?: A Lot Help from another person to put on and taking off regular upper body clothing?: A Lot Help from another person to put on and taking off regular lower body clothing?: A Little 6 Click Score: 17   End of Session Equipment Utilized During Treatment: Gait belt;Rolling walker;Oxygen Nurse Communication: Mobility status  Activity Tolerance: Patient tolerated treatment well Patient left: in chair;with chair alarm set  OT Visit Diagnosis: Unsteadiness on feet (R26.81);Muscle weakness (generalized) (M62.81)                Time: 3716-9678 OT Time Calculation (min): 18 min Charges:  OT General Charges $OT Visit: 1 Visit OT Evaluation $OT Eval Low Complexity: 1 Low  Amanda Cain OTR/L acute rehab services Office: [phone]  12/06/2020,  12:32 PM

## 2020-12-06 NOTE — Progress Notes (Signed)
Daily Progress Note   Patient Name: Amanda Cain       Date: 12/06/2020 DOB: 1946-05-12  Age: 75 y.o. MRN#: [id number] Attending Physician: Shelly Coss, MD Primary Care Physician: Mayra Neer, MD Admit Date: 12/04/2020  Reason for Consultation/Follow-up: Establishing goals of care  Subjective: Medical records reviewed. Assessed patient at the bedside and met with her three sisters ([name], Mikle Bosworth, and [place]) as scheduled for a goals of care meeting. [name] is sitting In her chair eating breakfast and breathing comfortably on room air.  I briefly introduced Palliative Medicine as specialized medical care for people living with serious illness. We then discussed Amanda Cain' reconsideration of code status as well as the risks and benefits. Her sisters have DNR orders and while they believe a DNR is more appropriate, they would support her decision either way. Therapeutic listening and emotional support was provided as the family reflected on prior experiences with end of life decision making, including their father who had a stroke and their mother who had Alzheimer's. Discussed Amanda Cain' interest in hospice if she would be able to return home. Educated on hospice philosophy and unfortunately limited availability of caregiver support. She is not ready to discontinue life-prolonging medications at this time, but rather states her willingness to discharge to SNF for additional strengthening. Her goal is to return home after this with decreased needs for assistance.  At [facility] request, we also had a discussion of potential dialysis in the future. Amanda Cain shares that her brother and nephew have been on dialysis but they do not speak of it much with her. Amanda Cain doesn't believe she would be  strong enough to withstand dialysis in her current condition. Counseled on the risks and benefits of dialysis, sharing my professional opinion and concern that her insight is correct. At this time, she would not pursue dialysis if offered. Encouraged to have ongoing discussions with family as she continues with current medical management of kidney disease. Briefly reviewed her other medical conditions. Patient and family have a good understanding of the severity of her illness.  A MOST form was introduced and extensive conversation was had, covering concepts specific to code status, artifical feeding and hydration, continued IV antibiotics and rehospitalization. With the support of her family and PMT, [name] has made the following decisions:  Cardiopulmonary Resuscitation: Do Not Attempt Resuscitation (  DNR/No CPR)  Medical Interventions: Limited Additional Interventions: Use medical treatment, IV fluids and cardiac monitoring as indicated. Do not use intubated or mechanical ventilation. May consider use of less invasive airway support such as BiPAP or CPAP. Also provide comfort measures. Transfer to hospital if indicated. Avoid intensive care.  Antibiotics: Antibiotics if indicated  IV Fluids: IV fluids for a defined trial period  Feeding Tube: No feeding tube   Questions and concerns were addressed.  Hard Choices booklet left for review. The family was encouraged to call with questions or concerns.  PMT will continue to support holistically.   Length of Stay: 2  Current Medications: Scheduled Meds:   acidophilus  1 capsule Oral BID   arformoterol  15 mcg Nebulization BID   atorvastatin  80 mg Oral QHS   azelastine  1 spray Each Nare BID   azithromycin  500 mg Oral Daily   B-complex with vitamin C  1 tablet Oral Daily   budesonide (PULMICORT) nebulizer solution  0.5 mg Nebulization BID   calcitRIOL  0.25 mcg Oral Daily   cloNIDine  0.1 mg Oral Daily   diltiazem  360 mg Oral Daily    escitalopram  20 mg Oral QHS   gabapentin  100 mg Oral BID   guaiFENesin  600 mg Oral BID   hydrALAZINE  37.5 mg Oral TID   insulin aspart  0-6 Units Subcutaneous TID WC   insulin glargine  10 Units Subcutaneous Daily   ipratropium-albuterol  3 mL Nebulization Q6H WA   levothyroxine  200 mcg Oral QAC breakfast   LORazepam  0.25 mg Oral BID WC   And   LORazepam  0.5 mg Oral QHS   metoprolol succinate  50 mg Oral q morning   montelukast  10 mg Oral QHS   paliperidone  3 mg Oral QHS   pantoprazole  40 mg Oral QAC breakfast   polyethylene glycol  17 g Oral QODAY   polyvinyl alcohol  1 drop Both Eyes BID   predniSONE  40 mg Oral Q breakfast   sucralfate  1 g Oral Daily   torsemide  40 mg Oral q AM    Continuous Infusions:  cefTRIAXone (ROCEPHIN)  IV 2 g (12/06/20 0852)    PRN Meds: acetaminophen **OR** acetaminophen, polyvinyl alcohol, sodium chloride  Physical Exam Vitals and nursing note reviewed.  Constitutional:      General: She is not in acute distress. Cardiovascular:     Rate and Rhythm: Normal rate.  Pulmonary:     Effort: Pulmonary effort is normal.  Skin:    General: Skin is warm and dry.  Neurological:     Mental Status: She is alert and oriented to person, place, and time.  Psychiatric:        Mood and Affect: Mood normal.            Vital Signs: BP (!) 148/29 (BP Location: Right Arm)   Pulse 62   Temp 98.2 F (36.8 C) (Oral)   Resp 20   Ht $R'4\' 11"'Ll$  (1.499 m)   Wt 105.7 kg   SpO2 100%   BMI 47.07 kg/m  SpO2: SpO2: 100 % O2 Device: O2 Device: Nasal Cannula O2 Flow Rate: O2 Flow Rate (L/min): 2 L/min  Intake/output summary:  Intake/Output Summary (Last 24 hours) at 12/06/2020 0959 Last data filed at 12/05/2020 1819 Gross per 24 hour  Intake 100 ml  Output --  Net 100 ml   LBM: Last BM Date: 12/03/20 Baseline  Weight: Weight: 101.6 kg Most recent weight: Weight: 105.7 kg       Palliative Assessment/Data: 50%      Patient Active Problem List    Diagnosis Date Noted   Acute respiratory failure with hypoxia ([place]) 12/04/2020   Constipation 12/04/2020   Goals of care, counseling/discussion    Mild protein malnutrition ([place]) 10/24/2020   Class 3 obesity 10/24/2020   Aortic atherosclerosis ([place]) 10/24/2020   Dyspnea 10/23/2020   Acute CHF (congestive heart failure) ([place]) 09/24/2020   Acute exacerbation of CHF (congestive heart failure) ([place]) 04/16/2020   Acute on chronic diastolic CHF (congestive heart failure) ([place]) 02/20/2020   Hypokalemia    Anxiety    Physical deconditioning 03/13/2019   Acute recurrent maxillary sinusitis 03/13/2019   Pain due to onychomycosis of toenails of both feet 11/21/2018   Shortness of breath 08/09/2018   Exacerbation of asthma 06/19/2018   Chronic respiratory failure with hypoxia ([place]) 05/15/2018   Pulmonary hypertension ([place]) 05/14/2018   Atypical chest pain 05/12/2018   Community acquired pneumonia 12/13/2017   CAP (community acquired pneumonia) 12/13/2017   CHF (congestive heart failure) ([place]) 12/12/2017   Morbid obesity ([place]) 11/21/2017   Flu 07/07/2017   Influenza A 07/05/2017   Acute on chronic respiratory failure with hypoxia ([place]) 07/05/2017   Ascites    Acute on chronic diastolic heart failure ([place]) 11/17/2016   CKD (chronic kidney disease), stage IV ([place]) 11/17/2016   S/P laparoscopic cholecystectomy 03/02/2015   Hyperlipidemia 11/25/2012   Depression 11/25/2012   GERD (gastroesophageal reflux disease) 11/25/2012   Postoperative anemia due to acute blood loss 11/01/2012   Osteoarthritis of right knee 10/31/2012   Hyperglycemia 11/30/2011   SBP (spontaneous bacterial peritonitis) ([place]) 11/25/2011   Syncope and collapse 11/20/2011   Abdominal pain 11/19/2011   Hyponatremia 11/19/2011   Normocytic anemia 11/19/2011   Acute kidney injury superimposed on CKD ([place]) 11/19/2011   Moderate protein-calorie malnutrition ([place]) 11/19/2011   Essential hypertension 11/19/2011    Hypothyroidism, acquired 11/19/2011   Dyslipidemia 11/19/2011   Diabetes ([place]) 07/15/2011    Palliative Care Assessment & Plan   Patient Profile: 75 y.o. female  with past medical history of diastolic CHF last EF 70-48% with grade 2 diastolic dysfunction, chronic respiratory failure on 2 L, asthma, pulmonary hypertension, bronchitis, hypertension, hyperlipidemia, diabetes mellitus type 2, CKD stage IV, chronic anemia and hypothyroidism admitted on 12/04/2020 with shortness of breath and acute on chronic respiratory failure secondary to pneumonia.   Pt [facility] from [facility] and is being admitted for third hospitalization in the past 6 months. Palliative care has been consulted to assist with goals of care conversation.  Assessment: Goals of care conversation Acute on chronic respiratory failure with hypoxia, improved to baseline Acute on chronic diastolic congestive heart failure/pulmonary hypertension, improving Dysphagia CKD stage 4  Recommendations/Plan: DNR/DNI confirmed, gold DNR form signed and placed in hard chart. Copies scanned into EMR and provided to family. MOST form completed, pink form signed and placed in hard chart. Copies scanned into EMR and provided to family. Patient prefers to discharge to [facility] with outpatient palliative (already followed by [facility]) Given her current debility, patient would not want dialysis in the future Psychosocial and emotional support provided Consult spiritual care as requested for spiritual support and prayer  Goals of Care and Additional Recommendations: Limitations on Scope of Treatment: No Artificial Feeding, No Hemodialysis, and limited additional interventions as per MOST form  Code Status: DNR   Code Status Orders  (From  admission, onward)           Start     Ordered   12/06/20 0927  Do not attempt resuscitation (DNR)  Continuous       Question Answer Comment  In the event of cardiac or respiratory ARREST Do  not call a "code blue"   In the event of cardiac or respiratory ARREST Do not perform Intubation, CPR, defibrillation or ACLS   In the event of cardiac or respiratory ARREST Use medication by any route, position, wound care, and other measures to relive pain and suffering. May use oxygen, suction and manual treatment of airway obstruction as needed for comfort.      12/06/20 0926           Code Status History     Date Active Date Inactive Code Status Order ID Comments User Context   12/04/2020 0918 12/06/2020 0926 Full Code [id number]  Norval Morton, MD ED   10/23/2020 2357 10/28/2020 0956 Full Code [id number]  Reubin Milan, MD ED   09/25/2020 0232 10/01/2020 1903 Full Code [id number]  Rise Patience, MD ED   04/16/2020 2338 04/21/2020 1924 Full Code [id number]  Marcelyn Bruins, MD ED   02/20/2020 1341 02/26/2020 1858 Full Code [id number]  Mckinley Jewel, MD ED   05/12/2018 1855 05/15/2018 2030 Full Code [id number]  [place]ford Shi, MD ED   12/13/2017 0033 12/19/2017 1910 Full Code [id number]  Jani Gravel, MD ED   07/05/2017 2108 07/08/2017 1526 Full Code [id number]  Etta Quill, DO ED   11/17/2016 0656 11/20/2016 1544 Full Code [id number]  Edwin Dada, MD Inpatient   03/02/2015 1247 03/03/2015 1434 Full Code [id number]  Ralene Ok, MD Inpatient   10/31/2012 1237 11/02/2012 1906 Full Code [id number]  Penelope Coop Inpatient   11/19/2011 2057 11/30/2011 1650 Full Code [id number]  Parke Simmers, RN ED   07/15/2011 1322 07/19/2011 1429 Full Code [id number]  Hubert Azure, RN Inpatient      Prognosis:  Guarded to poor long-term prognosis given advanced age, functional decline, multiple hospitalizations for exacerbations of chronic conditions, multiple comorbidities  Discharge Planning: [place] for rehab with Palliative care service follow-up  Care plan was discussed with patient, patient's 3 sisters, Dr. Tawanna Solo, Coralee Pesa LCSW   Time In:  9:00am Time Out: 10:10am Total Time 70 minutes Prolonged Time Billed  yes    Greater than 50% of this time was spent in counseling and coordinating care related to the above assessment and plan.  Dorthy Cooler, PA-C Palliative Medicine Team Team phone # [phone]  Thank you for allowing the Palliative Medicine Team to assist in the care of this patient. Please utilize secure chat with additional questions, if there is no response within 30 minutes please call the above phone number.  Palliative Medicine Team providers are available by phone from 7am to 7pm daily and can be reached through the team cell phone.  Should this patient require assistance outside of these hours, please call the patient's attending physician.

## 2020-12-06 NOTE — Progress Notes (Signed)
Chaplain provided prayer to patient.  Pt is very spiritual [facility]) and she focuses on gratitude in her faith as a way to face challenges.  Pt's prayer is for a "miracle" for healing similar to her sense of healing from breast cancer.  Chaplain prayed for a sense of God's peace, healing touch and presence in this time.  Also encouraged pt to breathe slowly, but pt acknowledged difficulty breathing deeply.  Pt participated verbally in prayer ([place], [place], throughout) and was very appreciative of visit.    Please contact for further support as needed or requested.  Luana Shu [phone]     12/06/20 1600  Clinical Encounter Type  Visited With Patient  Visit Type Initial;Spiritual support  Referral From Patient  Consult/Referral To Chaplain  Spiritual Encounters  Spiritual Needs Prayer  Stress Factors  Patient Stress Factors Health changes

## 2020-12-07 ENCOUNTER — Inpatient Hospital Stay (HOSPITAL_COMMUNITY): Payer: Medicare Other

## 2020-12-07 DIAGNOSIS — J45998 Other asthma: Secondary | ICD-10-CM | POA: Diagnosis not present

## 2020-12-07 DIAGNOSIS — D6489 Other specified anemias: Secondary | ICD-10-CM | POA: Diagnosis not present

## 2020-12-07 DIAGNOSIS — R6889 Other general symptoms and signs: Secondary | ICD-10-CM | POA: Diagnosis not present

## 2020-12-07 DIAGNOSIS — K219 Gastro-esophageal reflux disease without esophagitis: Secondary | ICD-10-CM | POA: Diagnosis not present

## 2020-12-07 DIAGNOSIS — E441 Mild protein-calorie malnutrition: Secondary | ICD-10-CM | POA: Diagnosis not present

## 2020-12-07 DIAGNOSIS — Z515 Encounter for palliative care: Secondary | ICD-10-CM | POA: Diagnosis not present

## 2020-12-07 DIAGNOSIS — R739 Hyperglycemia, unspecified: Secondary | ICD-10-CM | POA: Diagnosis not present

## 2020-12-07 DIAGNOSIS — E039 Hypothyroidism, unspecified: Secondary | ICD-10-CM | POA: Diagnosis not present

## 2020-12-07 DIAGNOSIS — I5032 Chronic diastolic (congestive) heart failure: Secondary | ICD-10-CM | POA: Diagnosis not present

## 2020-12-07 DIAGNOSIS — J9611 Chronic respiratory failure with hypoxia: Secondary | ICD-10-CM | POA: Diagnosis not present

## 2020-12-07 DIAGNOSIS — Z794 Long term (current) use of insulin: Secondary | ICD-10-CM | POA: Diagnosis not present

## 2020-12-07 DIAGNOSIS — N189 Chronic kidney disease, unspecified: Secondary | ICD-10-CM | POA: Diagnosis not present

## 2020-12-07 DIAGNOSIS — E118 Type 2 diabetes mellitus with unspecified complications: Secondary | ICD-10-CM | POA: Diagnosis not present

## 2020-12-07 DIAGNOSIS — Z789 Other specified health status: Secondary | ICD-10-CM | POA: Diagnosis not present

## 2020-12-07 DIAGNOSIS — R0989 Other specified symptoms and signs involving the circulatory and respiratory systems: Secondary | ICD-10-CM | POA: Diagnosis not present

## 2020-12-07 DIAGNOSIS — J9621 Acute and chronic respiratory failure with hypoxia: Secondary | ICD-10-CM | POA: Diagnosis not present

## 2020-12-07 DIAGNOSIS — J9601 Acute respiratory failure with hypoxia: Secondary | ICD-10-CM | POA: Diagnosis not present

## 2020-12-07 DIAGNOSIS — R0602 Shortness of breath: Secondary | ICD-10-CM | POA: Diagnosis not present

## 2020-12-07 DIAGNOSIS — Z9189 Other specified personal risk factors, not elsewhere classified: Secondary | ICD-10-CM | POA: Diagnosis not present

## 2020-12-07 DIAGNOSIS — I7091 Generalized atherosclerosis: Secondary | ICD-10-CM | POA: Diagnosis not present

## 2020-12-07 DIAGNOSIS — I10 Essential (primary) hypertension: Secondary | ICD-10-CM | POA: Diagnosis not present

## 2020-12-07 DIAGNOSIS — F32A Depression, unspecified: Secondary | ICD-10-CM | POA: Diagnosis not present

## 2020-12-07 DIAGNOSIS — N184 Chronic kidney disease, stage 4 (severe): Secondary | ICD-10-CM | POA: Diagnosis not present

## 2020-12-07 DIAGNOSIS — I5031 Acute diastolic (congestive) heart failure: Secondary | ICD-10-CM | POA: Diagnosis not present

## 2020-12-07 DIAGNOSIS — D649 Anemia, unspecified: Secondary | ICD-10-CM | POA: Diagnosis not present

## 2020-12-07 DIAGNOSIS — I5033 Acute on chronic diastolic (congestive) heart failure: Secondary | ICD-10-CM | POA: Diagnosis not present

## 2020-12-07 DIAGNOSIS — I272 Pulmonary hypertension, unspecified: Secondary | ICD-10-CM | POA: Diagnosis not present

## 2020-12-07 DIAGNOSIS — Z743 Need for continuous supervision: Secondary | ICD-10-CM | POA: Diagnosis not present

## 2020-12-07 DIAGNOSIS — E785 Hyperlipidemia, unspecified: Secondary | ICD-10-CM | POA: Diagnosis not present

## 2020-12-07 LAB — CBC WITH DIFFERENTIAL/PLATELET
Abs Immature Granulocytes: 0.13 10*3/uL — ABNORMAL HIGH (ref 0.00–0.07)
Basophils Absolute: 0 10*3/uL (ref 0.0–0.1)
Basophils Relative: 0 %
Eosinophils Absolute: 0 10*3/uL (ref 0.0–0.5)
Eosinophils Relative: 0 %
HCT: 23.4 % — ABNORMAL LOW (ref 36.0–46.0)
Hemoglobin: 7.2 g/dL — ABNORMAL LOW (ref 12.0–15.0)
Immature Granulocytes: 2 %
Lymphocytes Relative: 5 %
Lymphs Abs: 0.4 10*3/uL — ABNORMAL LOW (ref 0.7–4.0)
MCH: 27.8 pg (ref 26.0–34.0)
MCHC: 30.8 g/dL (ref 30.0–36.0)
MCV: 90.3 fL (ref 80.0–100.0)
Monocytes Absolute: 0.6 10*3/uL (ref 0.1–1.0)
Monocytes Relative: 7 %
Neutro Abs: 7.6 10*3/uL (ref 1.7–7.7)
Neutrophils Relative %: 86 %
Platelets: 221 10*3/uL (ref 150–400)
RBC: 2.59 MIL/uL — ABNORMAL LOW (ref 3.87–5.11)
RDW: 17.1 % — ABNORMAL HIGH (ref 11.5–15.5)
WBC: 8.7 10*3/uL (ref 4.0–10.5)
nRBC: 0 % (ref 0.0–0.2)

## 2020-12-07 LAB — BASIC METABOLIC PANEL
Anion gap: 8 (ref 5–15)
BUN: 81 mg/dL — ABNORMAL HIGH (ref 8–23)
CO2: 33 mmol/L — ABNORMAL HIGH (ref 22–32)
Calcium: 9.1 mg/dL (ref 8.9–10.3)
Chloride: 96 mmol/L — ABNORMAL LOW (ref 98–111)
Creatinine, Ser: 3.32 mg/dL — ABNORMAL HIGH (ref 0.44–1.00)
GFR, Estimated: 14 mL/min — ABNORMAL LOW (ref >60)
Glucose, Bld: 134 mg/dL — ABNORMAL HIGH (ref 70–99)
Potassium: 3.9 mmol/L (ref 3.5–5.1)
Sodium: 137 mmol/L (ref 135–145)

## 2020-12-07 LAB — RESP PANEL BY RT-PCR (FLU A&B, COVID) ARPGX2
Influenza A by PCR: NEGATIVE
Influenza B by PCR: NEGATIVE
SARS Coronavirus 2 by RT PCR: NEGATIVE

## 2020-12-07 LAB — GLUCOSE, CAPILLARY
Glucose-Capillary: 110 mg/dL — ABNORMAL HIGH (ref 70–99)
Glucose-Capillary: 178 mg/dL — ABNORMAL HIGH (ref 70–99)
Glucose-Capillary: 219 mg/dL — ABNORMAL HIGH (ref 70–99)
Glucose-Capillary: 189 mg/dL — ABNORMAL HIGH (ref 70–99)

## 2020-12-07 LAB — T3, FREE: T3, Free: 1.3 pg/mL — ABNORMAL LOW (ref 2.0–4.4)

## 2020-12-07 MED ORDER — CEFDINIR 300 MG PO CAPS: 300.0000 mg | ORAL_CAPSULE | Freq: Two times a day (BID) | ORAL | 0 refills | Status: AC

## 2020-12-07 MED ORDER — HYDRALAZINE HCL 50 MG PO TABS: 50.0000 mg | ORAL_TABLET | Freq: Three times a day (TID) | ORAL | Status: AC

## 2020-12-07 MED ORDER — PREDNISONE 20 MG PO TABS: 40.0000 mg | ORAL_TABLET | Freq: Every day | ORAL | 0 refills | Status: AC

## 2020-12-07 MED ORDER — AZITHROMYCIN 500 MG PO TABS: 500.0000 mg | ORAL_TABLET | Freq: Once | ORAL | 0 refills | Status: AC

## 2020-12-07 MED ORDER — LEVOTHYROXINE SODIUM 75 MCG PO TABS: 75.0000 ug | ORAL_TABLET | Freq: Every day | ORAL | 0 refills | Status: AC

## 2020-12-07 MED ORDER — GUAIFENESIN ER 600 MG PO TB12: 600.0000 mg | ORAL_TABLET | Freq: Two times a day (BID) | ORAL | Status: AC | PRN

## 2020-12-07 NOTE — Consult Note (Signed)
   [facility] Inpatient Consult   12/07/2020  Chelly Dombeck [birth year] [phone]  [facility] [[facility]] Patient: [facility]   Patient screened for hospitalization with noted extreme high risk score for unplanned readmission risk and  to assess for potential [facility] service needs for post hospital transition.  Review of patient's medical record reveals patient is to return to a skilled nursing level of care for post hospital needs.  Plan:  Will sign off at transition, as needs are to be met at a skilled nursing facility level of care.    For questions contact:   Natividad Brood, RN BSN [facility] Liaison  [phone] business mobile phone Toll free office [phone]  Fax number: [phone] [email or web address]

## 2020-12-07 NOTE — Progress Notes (Signed)
Modified Barium Swallow Progress Note  Patient Details  Name: Amanda Cain MRN: [id number] Date of Birth: 1945/07/24  Today's Date: 12/07/2020  Modified Barium Swallow completed.  Full report located under Chart Review in the Imaging Section.  Brief recommendations include the following:  Clinical Impression  Pt presents with functional swallow marked by appropriate bolus formation, timely swallow trigger and adequate laryngeal vestibule closure resulting in no aspiration/penetration and minimal to no pharyngeal residuals across PO trials. While thin liquid consumption via cup and straw consistently resulted in swallow trigger at the level of the pyriform sinuses, initation of swallow was without delay and no aspiration or penetration noted. Mildly prolonged mastication observed with regular textured solid, suspect due to missing dentition, however no oral residuals noted via fluoro. Pill with thin passed cervical esophagus without difficulty. Recommend continuation of regular/thin liquid diet with SLP to f/u for tolerance.   Swallow Evaluation Recommendations       SLP Diet Recommendations: Regular solids;Thin liquid   Liquid Administration via: Cup;Straw   Medication Administration: Whole meds with liquid   Supervision: Patient able to self feed   Compensations: Slow rate;Small sips/bites   Postural Changes: Seated upright at 90 degrees;Remain semi-upright after after feeds/meals (Comment)   Oral Care Recommendations: Oral care BID       Ellwood Dense, [place], [place] Office Number: [phone]  Acie Fredrickson 12/07/2020,11:03 AM

## 2020-12-07 NOTE — Discharge Summary (Signed)
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 distress,obese Cardiovascular: RRR, S1/S2 +, no rubs, no gallops Respiratory: CTA bilaterally, no wheezing, no rhonchi Abdominal: Soft, NT, ND, bowel sounds + Extremities: no edema, no cyanosis    The results of significant diagnostics from this hospitalization (including imaging, microbiology, ancillary and laboratory) are listed below for reference.     Microbiology: Recent Results (from the past 240 hour(s))  Resp Panel by RT-PCR (Flu A&B, Covid) Nasopharyngeal Swab     Status: None   Collection Time: 12/04/20  7:53 AM   Specimen: Nasopharyngeal Swab; Nasopharyngeal(NP) swabs in vial transport medium  Result Value Ref Range Status   SARS Coronavirus 2 by RT PCR NEGATIVE NEGATIVE Final    Comment: (NOTE) SARS-CoV-2 target nucleic acids are NOT DETECTED.  The SARS-CoV-2 RNA is generally detectable in upper respiratory specimens during the acute phase of infection. The lowest concentration of SARS-CoV-2 viral copies this assay can detect is 138 copies/mL. A  negative result does not preclude SARS-Cov-2 infection and should not be used as the sole basis for treatment or other patient management decisions. A negative result may occur with  improper specimen collection/handling, submission of specimen other than nasopharyngeal swab, presence of viral mutation(s) within the areas targeted by this assay, and inadequate number of viral copies(<138 copies/mL). A negative result must be combined with clinical observations, patient history, and epidemiological information. The expected result is Negative.  Fact Sheet for Patients:  EntrepreneurPulse.com.au  Fact Sheet for Healthcare Providers:  IncredibleEmployment.be  This test is no t yet approved or cleared by the [place] FDA and  has been authorized for detection and/or diagnosis of SARS-CoV-2 by FDA under an Emergency Use Authorization (EUA). This EUA will remain  in effect (meaning this test can be used) for the duration of the COVID-19 declaration under Section 564(b)(1) of the Act, 21 U.S.C.section 360bbb-3(b)(1), unless the authorization is terminated  or revoked sooner.       Influenza A by PCR NEGATIVE NEGATIVE Final   Influenza B by PCR NEGATIVE NEGATIVE Final    Comment: (NOTE) The Xpert Xpress SARS-CoV-2/FLU/RSV plus assay is intended as an aid in the diagnosis of influenza from Nasopharyngeal swab specimens and should not be used as a sole basis for treatment. Nasal washings and aspirates are unacceptable for Xpert Xpress SARS-CoV-2/FLU/RSV testing.  Fact Sheet for Patients: EntrepreneurPulse.com.au  Fact Sheet for Healthcare Providers: IncredibleEmployment.be  This test is not yet approved or cleared by the [place] FDA and has been authorized for detection and/or diagnosis of SARS-CoV-2 by FDA under an Emergency Use Authorization (EUA). This EUA will remain in effect (meaning this test can  be used) for the duration of the COVID-19 declaration under Section 564(b)(1) of the Act, 21 U.S.C. section 360bbb-3(b)(1), unless the authorization is terminated or revoked.  Performed at [facility], [place] [address], [place], [place] [address]      Labs: BNP (last 3 results) Recent Labs    09/24/20 2122 10/23/20 1511 12/04/20 0718  BNP 74.8 99.6 124.5*   Basic Metabolic Panel: Recent Labs  Lab 12/04/20 0718 12/04/20 0949 12/05/20 0136 12/06/20 0150 12/07/20 0201  NA 135 137 136 138 137  K 3.7 4.1 4.1 4.0 3.9  CL 95*  --  97* 96* 96*  CO2 31  --  30 31 33*  GLUCOSE 149*  --  166* 161* 134*  BUN  59*  --  59* 74* 81*  CREATININE 3.56*  --  3.38* 3.45* 3.32*  CALCIUM 9.0  --  9.3 9.4 9.1  MG 1.9  --   --   --   --    Liver Function Tests: Recent Labs  Lab 12/05/20 0136  AST 15  ALT 17  ALKPHOS 50  BILITOT 0.9  PROT 6.0*  ALBUMIN 3.3*   No results for input(s): LIPASE, AMYLASE in the last 168 hours. No results for input(s): AMMONIA in the last 168 hours. CBC: Recent Labs  Lab 12/04/20 0718 12/04/20 0940 12/04/20 0949 12/05/20 0136 12/06/20 0150 12/07/20 0201  WBC 9.6  --   --  7.5 8.5 8.7  NEUTROABS 8.2*  --   --   --  7.5 7.6  HGB 7.6* 7.5* 8.5* 7.7* 7.4* 7.2*  HCT 25.8* 26.1* 25.0* 24.7* 23.6* 23.4*  MCV 93.1  --   --  89.2 90.1 90.3  PLT 221  --   --  227 214 221   Cardiac Enzymes: No results for input(s): CKTOTAL, CKMB, CKMBINDEX, TROPONINI in the last 168 hours. BNP: Invalid input(s): POCBNP CBG: Recent Labs  Lab 12/06/20 1145 12/06/20 1640 12/06/20 2017 12/07/20 0746 12/07/20 1142  GLUCAP 184* 186* 179* 110* 178*   D-Dimer No results for input(s): DDIMER in the last 72 hours. Hgb A1c No results for input(s): HGBA1C in the last 72 hours. Lipid Profile No results for input(s): CHOL, HDL, LDLCALC, TRIG, CHOLHDL, LDLDIRECT in the last 72 hours. Thyroid function studies No results for input(s): TSH, T4TOTAL, T3FREE, THYROIDAB  in the last 72 hours.  Invalid input(s): FREET3 Anemia work up Recent Labs    12/06/20 0150  FERRITIN 883*  TIBC 231*  IRON 47   Urinalysis    Component Value Date/Time   COLORURINE YELLOW 09/29/2020 0543   APPEARANCEUR HAZY (A) 09/29/2020 0543   LABSPEC 1.010 09/29/2020 0543   PHURINE 5.0 09/29/2020 0543   GLUCOSEU NEGATIVE 09/29/2020 0543   HGBUR NEGATIVE 09/29/2020 0543   BILIRUBINUR NEGATIVE 09/29/2020 0543   KETONESUR NEGATIVE 09/29/2020 0543   PROTEINUR 100 (A) 09/29/2020 0543   UROBILINOGEN 0.2 10/26/2012 0854   NITRITE NEGATIVE 09/29/2020 0543   LEUKOCYTESUR NEGATIVE 09/29/2020 0543   Sepsis Labs Invalid input(s): PROCALCITONIN,  WBC,  LACTICIDVEN Microbiology Recent Results (from the past 240 hour(s))  Resp Panel by RT-PCR (Flu A&B, Covid) Nasopharyngeal Swab     Status: None   Collection Time: 12/04/20  7:53 AM   Specimen: Nasopharyngeal Swab; Nasopharyngeal(NP) swabs in vial transport medium  Result Value Ref Range Status   SARS Coronavirus 2 by RT PCR NEGATIVE NEGATIVE Final    Comment: (NOTE) SARS-CoV-2 target nucleic acids are NOT DETECTED.  The SARS-CoV-2 RNA is generally detectable in upper respiratory specimens during the acute phase of infection. The lowest concentration of SARS-CoV-2 viral copies this assay can detect is 138 copies/mL. A negative result does not preclude SARS-Cov-2 infection and should not be used as the sole basis for treatment or other patient management decisions. A negative result may occur with  improper specimen collection/handling, submission of specimen other than nasopharyngeal swab, presence of viral mutation(s) within the areas targeted by this assay, and inadequate number of viral copies(<138 copies/mL). A negative result must be combined with clinical observations, patient history, and epidemiological information. The expected result is Negative.  Fact Sheet for Patients:   EntrepreneurPulse.com.au  Fact Sheet for Healthcare Providers:  IncredibleEmployment.be  This test is no t yet approved or  cleared by the [place] and  has been authorized for detection and/or diagnosis of SARS-CoV-2 by FDA under an Emergency Use Authorization (EUA). This EUA will remain  in effect (meaning this test can be used) for the duration of the COVID-19 declaration under Section 564(b)(1) of the Act, 21 U.S.C.section 360bbb-3(b)(1), unless the authorization is terminated  or revoked sooner.       Influenza A by PCR NEGATIVE NEGATIVE Final   Influenza B by PCR NEGATIVE NEGATIVE Final    Comment: (NOTE) The Xpert Xpress SARS-CoV-2/FLU/RSV plus assay is intended as an aid in the diagnosis of influenza from Nasopharyngeal swab specimens and should not be used as a sole basis for treatment. Nasal washings and aspirates are unacceptable for Xpert Xpress SARS-CoV-2/FLU/RSV testing.  Fact Sheet for Patients: EntrepreneurPulse.com.au  Fact Sheet for Healthcare Providers: IncredibleEmployment.be  This test is not yet approved or cleared by the [place] FDA and has been authorized for detection and/or diagnosis of SARS-CoV-2 by FDA under an Emergency Use Authorization (EUA). This EUA will remain in effect (meaning this test can be used) for the duration of the COVID-19 declaration under Section 564(b)(1) of the Act, 21 U.S.C. section 360bbb-3(b)(1), unless the authorization is terminated or revoked.  Performed at [facility], [place] [address] [place],  [address]     Please note: You were cared for by a hospitalist during your hospital stay. Once you are discharged, your primary care physician will handle any further medical issues. Please note that NO REFILLS for any discharge medications will be authorized once you are discharged, as it is imperative that you return to your  primary care physician (or establish a relationship with a primary care physician if you do not have one) for your post hospital discharge needs so that they can reassess your need for medications and monitor your lab values.    Time coordinating discharge: 40 minutes  SIGNED:   Shelly Coss, MD  [facility] 12/07/2020, 11:52 AM Pager [phone]  If 7PM-7AM, please contact night-coverage [email or web address] Password [email or web address]

## 2020-12-07 NOTE — TOC Transition Note (Signed)
Transition of Care [facility]) - CM/SW Discharge Note   Patient Details  Name: Amanda Cain MRN: [id number] Date of Birth: [birth year]  [facility] CM/SW Contact:  Angelita Ingles, RN Phone Number:[phone]  12/07/2020, 1:54 PM   Clinical Narrative:    Patient to discharge back to [place] due to no beds available at [facility]. Patient and family are agreeable to [place]. Insurance auth cleared and info has been given to [place] at [place]. Transportation has been set up with [facility]. Sister has been notified. Bedside nurse has been updated. No other needs noted at this time.   Please call report to [facility] [phone] Room # 213      Final next level of care: Skilled Nursing Facility Barriers to Discharge: No Barriers Identified   Patient Goals and CMS Choice Patient states their goals for this hospitalization and ongoing recovery are:: Pt and family wanting to go to [facility] for SNF. CMS Medicare.gov Compare Post Acute Care list provided to:: Patient Choice offered to / list presented to : Patient  Discharge Placement              Patient chooses bed at: [facility] Patient to be transferred to facility by: [place] Name of family member notified: [name] sister Patient and family notified of of transfer: 12/07/20  Discharge Plan and Services     Post Acute Care Choice: [place]          DME Arranged: N/A DME Agency: NA       HH Arranged: NA HH Agency: NA        Social Determinants of Health (SDOH) Interventions     Readmission Risk Interventions Readmission Risk Prevention Plan 02/26/2020  Transportation Screening Complete  Medication Review [employer]) Complete  PCP or Specialist appointment within 3-5 days of discharge Complete  SW Recovery Care/Counseling Consult Complete  [place] Patient Refused  Some recent data might be hidden

## 2020-12-07 NOTE — Progress Notes (Signed)
Called [place] report given to [employer].

## 2020-12-08 ENCOUNTER — Other Ambulatory Visit: Payer: Self-pay

## 2020-12-08 ENCOUNTER — Non-Acute Institutional Stay: Payer: Medicare Other | Admitting: Hospice

## 2020-12-08 DIAGNOSIS — Z515 Encounter for palliative care: Secondary | ICD-10-CM

## 2020-12-08 DIAGNOSIS — J9621 Acute and chronic respiratory failure with hypoxia: Secondary | ICD-10-CM | POA: Diagnosis not present

## 2020-12-08 DIAGNOSIS — I5033 Acute on chronic diastolic (congestive) heart failure: Secondary | ICD-10-CM

## 2020-12-08 LAB — TYPE AND SCREEN
ABO/RH(D): O POS
Antibody Screen: POSITIVE
Unit division: 0
Unit division: 0

## 2020-12-08 LAB — BPAM RBC
Blood Product Expiration Date: 202208052359
Blood Product Expiration Date: 202208052359
Unit Type and Rh: 5100
Unit Type and Rh: 5100

## 2020-12-08 NOTE — Progress Notes (Signed)
[facility] Consult Note Telephone: [phone]  Fax: [phone]  PATIENT NAME: Amanda Cain [address] [place] [address] [phone] (home)  DOB: 04/30/1946 MRN: [id number]  PRIMARY CARE PROVIDER:    Mayra Neer, MD,  [address] [place] [address] [phone]  REFERRING PROVIDER:   Mayra Neer, MD [address] [place],  [place] [address] [phone]  RESPONSIBLE PARTY:   Self Emergency contact: Amanda Cain Contact Information     Name Relation Home Work Mobile   Amanda Cain Sister   [phone]   Amanda Cain   [phone]   Amanda Cain [phone]          I met face to face with patient at facility. Palliative Care was asked to follow this patient by consultation request of  Dr. Jules Cain to address advance care planning, complex medical decision making and goals of care clarification.  NP called Amanda Cain and updated her on visit.  This is the initial visit.    ASSESSMENT AND / RECOMMENDATIONS:   Advance Care Planning: Our advance care planning conversation included a discussion about:    The value and importance of advance care planning  Difference between Hospice and Palliative care Exploration of goals of care in the event of a sudden injury or illness  Identification and preparation of a healthcare agent  Review and updating or creation of an  advance directive document . Decision not to resuscitate or to de-escalate disease focused treatments due to poor prognosis.  CODE STATUS: Patient is a DO NOT RESUSCITATE.  Signed DNR form in facility records; same document uploaded to epic today.  Goals of Care: Goals include to maximize quality of life and symptom management. Most selections include limited additional intervention, antibiotics if indicated, IV fluids for defined trial period, no feeding tube.  I spent 25  minutes providing this initial consultation. More than 50% of the time in this consultation was spent on counseling patient and coordinating communication. --------------------------------------------------------------------------------------------------------------------------------------  Symptom Management/Plan: Acute on chronic respiratory failure with hypoxia/hypercapnia: Multifactorial including pneumonia, asthma, CHF exacerbation. Continue oxygen. Patient completes Omnicef antibiotic for pneumonia tomorrow. Continue Demadex, prednisone and Advair, Duoneb, Singulair as ordered. No added salt Anemia: Lab work showed hemoglobin of 7.6. Continue ferrous gluconate,  343m every M W F and Retacrit IJ every 14 days, as ordered. Routine CBC BMP Weakness: OT/PT is ongoing.  COPD: Continue oxygen supplementation at 2L/Min, and breathing treatments - Advair, Duoneb, as ordered.  Type 2 DM: Managed with Insulin  Follow up: Palliative care will continue to follow for complex medical decision making, advance care planning, and clarification of goals. Return 6 weeks or prn.Encouraged to call provider sooner with any concerns.   Family /Caregiver/Community Supports: Patient in SNF for ongoing care.   HOSPICE ELIGIBILITY/DIAGNOSIS: TBD  Chief Complaint: Initial Palliative care visit  HISTORY OF PRESENT ILLNESS:  Amanda Mitteris a 75y.o. year old female  with multiple medical conditions including acute on chronic respiratory failure with hypoxia/hypercapnia: multifactorial including pneumonia, asthma, CHF exacerbation. She was hospitalized for acute on chronic respiratory failure 7/8-7/03-2021, was treated in the hospital with antibiotics, steroid and diuretics and discharged to SNF for ongoing care. She endorsed shortness of on exertion related to her acute on chronic respiratory failure which impairs her independence, wrsened with ADLs. She reports oxgen and breathing treatments are helpful. History  of CKD stage 4, HTN, HLD, DM type II, hypothyroidism anxiety.  History obtained from review of EMR, discussion with primary team, caregiver, family and/or Ms. Amanda Cain.  Review and summarization of Epic records shows history from other than patient. Rest of 10 point ROS asked and negative.     Review of lab tests/diagnostics   Recent Labs  Lab 12/05/20 0136 12/06/20 0150 12/07/20 0201  WBC 7.5 8.5 8.7  HGB 7.7* 7.4* 7.2*  HCT 24.7* 23.6* 23.4*  PLT 227 214 221  MCV 89.2 90.1 90.3   Recent Labs  Lab 12/05/20 0136 12/06/20 0150 12/07/20 0201  NA 136 138 137  K 4.1 4.0 3.9  CL 97* 96* 96*  CO2 30 31 33*  BUN 59* 74* 81*  CREATININE 3.38* 3.45* 3.32*  GLUCOSE 166* 161* 134*   Recent Labs  Lab 12/05/20 0136  AST 15  ALT 17  ALKPHOS 50    Recent Labs  Lab 12/04/20 0718  BNP 169.0*    ROS General: NAD EYES: denies vision changes ENMT: denies dysphagia Cardiovascular: denies chest pain/discomfort Pulmonary: denies cough, endorses SOB Abdomen: endorses good appetite, denies constipation/diarrhea GU: denies dysuria, urinary frequency MSK:  endorses weakness,  no falls reported Skin: denies rashes or wounds Neurological: denies pain, denies insomnia Psych: Endorses positive mood Heme/lymph/immuno: denies bruises, abnormal bleeding  Physical Exam: Height/Weight 4 feet 11 inches / 230.4 pounds Constitutional: NAD General: Well groomed, cooperative EYES: anicteric sclera, lids intact, no discharge  ENMT: Moist mucous membrane CV: S1 S2, RRR, trace LE edema Pulmonary: No increased work of breathing, oxygen supplementation at 3 L/min Abdomen: active BS + 4 quadrants, soft and non tender GU: no suprapubic tenderness MSK: weakness, limited ROM Skin: warm and dry, no rashes or wounds on visible skin Neuro:  weakness, otherwise non focal Psych: non-anxious affect Hem/lymph/immuno: no widespread bruising   PAST MEDICAL HISTORY:  Active Ambulatory Problems     Diagnosis Date Noted   Diabetes ([facility]) 07/15/2011   Abdominal pain 11/19/2011   Hyponatremia 11/19/2011   Normocytic anemia 11/19/2011   Acute kidney injury superimposed on CKD ([facility]) 11/19/2011   Moderate protein-calorie malnutrition ([facility]) 11/19/2011   Essential hypertension 11/19/2011   Hypothyroidism, acquired 11/19/2011   Dyslipidemia 11/19/2011   Syncope and collapse 11/20/2011   SBP (spontaneous bacterial peritonitis) ([place]) 11/25/2011   Hyperglycemia 11/30/2011   Osteoarthritis of right knee 10/31/2012   Postoperative anemia due to acute blood loss 11/01/2012   Hyperlipidemia 11/25/2012   Depression 11/25/2012   GERD (gastroesophageal reflux disease) 11/25/2012   S/P laparoscopic cholecystectomy 03/02/2015   Acute on chronic diastolic heart failure ([facility] [place]) 11/17/2016   CKD (chronic kidney disease), stage IV ([facility] [place]) 11/17/2016   Ascites    Influenza A 07/05/2017   Acute on chronic respiratory failure with hypoxia ([facility]) 07/05/2017   Flu 07/07/2017   Morbid obesity ([facility]) 11/21/2017   CHF (congestive heart failure) ([facility]) 12/12/2017   Community acquired pneumonia 12/13/2017   CAP (community acquired pneumonia) 12/13/2017   Atypical chest pain 05/12/2018   Pulmonary hypertension ([facility]) 05/14/2018   Chronic respiratory failure with hypoxia ([facility]) 05/15/2018   Exacerbation of asthma 06/19/2018   Shortness of breath 08/09/2018   Pain due to onychomycosis of toenails of both feet 11/21/2018   Physical deconditioning 03/13/2019   Acute recurrent maxillary sinusitis 03/13/2019   Hypokalemia    Anxiety    Acute on chronic diastolic CHF (congestive heart failure) ([facility]) 02/20/2020   Acute exacerbation of CHF (congestive heart failure) ([facility]) 04/16/2020   Acute CHF (congestive heart failure) ([facility]) 09/24/2020   Dyspnea 10/23/2020  Mild protein malnutrition ([place]) 10/24/2020   Class 3 obesity 10/24/2020   Aortic atherosclerosis ([facility]) 10/24/2020   Acute respiratory failure with hypoxia ([facility])  12/04/2020   Constipation 12/04/2020   Goals of care, counseling/discussion    Resolved Ambulatory Problems    Diagnosis Date Noted   Osteoarthritis of left knee 07/15/2011   Septic shock(785.52) 11/21/2011   UTI (lower urinary tract infection) 11/25/2011   Past Medical History:  Diagnosis Date   Anemia    Arthritis    Bronchitis    Chronic kidney disease    Diabetes mellitus    Hx of gallstones    Hypercholesteremia    Hypertension    Hypothyroidism    Obesity     SOCIAL HX:  Social History   Tobacco Use   Smoking status: Never   Smokeless tobacco: Never  Substance Use Topics   Alcohol use: No     FAMILY HX:  Family History  Problem Relation Age of Onset   Alzheimer's disease Mother    Hypertension Mother    Stroke Father    Hypertension Father    Stroke Brother    Prostate cancer Brother    Diabetes Brother    Ovarian cancer Sister    Diabetes Sister    Breast cancer Neg Hx       ALLERGIES:  Allergies  Allergen Reactions   Ace Inhibitors Other (See Comments) and Cough    "Allergic," per MAR   Amlodipine Other (See Comments)    "Dizziness," per patient AND "Allergic," per the [facility]   Aspirin Other (See Comments)    "I have an ulcer," per the patient AND flagged as "Allergic," per the [facility]      PERTINENT MEDICATIONS:  Outpatient Encounter Medications as of 12/08/2020  Medication Sig   acetaminophen (TYLENOL) 500 MG tablet Take 500 mg by mouth in the morning.   albuterol (PROVENTIL HFA;VENTOLIN HFA) 108 (90 Base) MCG/ACT inhaler Inhale 2 puffs into the lungs every 4 (four) hours as needed for wheezing or shortness of breath.   albuterol (PROVENTIL) (2.5 MG/3ML) 0.083% nebulizer solution Take 3 mLs (2.5 mg total) by nebulization every 4 (four) hours as needed for wheezing or shortness of breath.   atorvastatin (LIPITOR) 80 MG tablet Take 80 mg by mouth at bedtime.   azelastine (ASTELIN) 0.1 % nasal spray Place 1 spray into both nostrils 2 (two) times  daily.   azithromycin (ZITHROMAX) 500 MG tablet Take 1 tablet (500 mg total) by mouth once for 1 dose.   benzonatate (TESSALON) 100 MG capsule Take 1 capsule (100 mg total) by mouth 3 (three) times daily as needed for cough.   Biotin 1000 MCG tablet Take 1,000 mcg by mouth daily.   calcitRIOL (ROCALTROL) 0.25 MCG capsule Take 0.25 mcg by mouth daily.   cefdinir (OMNICEF) 300 MG capsule Take 1 capsule (300 mg total) by mouth 2 (two) times daily for 2 days.   cloNIDine (CATAPRES) 0.1 MG tablet Take 0.1 mg by mouth daily.   diltiazem (CARDIZEM CD) 360 MG 24 hr capsule Take 360 mg by mouth daily.    Epoetin Alfa-epbx (RETACRIT IJ) Inject 15,000 Units as directed every 14 (fourteen) days.   escitalopram (LEXAPRO) 20 MG tablet Take 20 mg by mouth at bedtime.    estradiol (ESTRACE) 0.5 MG tablet Take 0.5 mg by mouth daily.   ferrous gluconate (FERGON) 324 MG tablet Take 324 mg by mouth every Monday, Wednesday, and Friday.   fluticasone-salmeterol (ADVAIR) 500-50 MCG/ACT AEPB Inhale  1 puff into the lungs in the morning and at bedtime.   folic acid-vitamin b complex-vitamin c-selenium-zinc (DIALYVITE) 3 MG TABS tablet Take 1 tablet by mouth daily.   gabapentin (NEURONTIN) 100 MG capsule Take 100 mg by mouth 2 (two) times daily.   glucose blood (ONETOUCH VERIO) test strip 1 each by Other route 2 (two) times daily. and lancets 2/day   guaiFENesin (MUCINEX) 600 MG 12 hr tablet Take 1 tablet (600 mg total) by mouth 2 (two) times daily as needed for up to 7 days.   hydrALAZINE (APRESOLINE) 50 MG tablet Take 1 tablet (50 mg total) by mouth 3 (three) times daily.   insulin degludec (TRESIBA FLEXTOUCH) 100 UNIT/ML FlexTouch Pen Inject 10 Units into the skin daily.   insulin lispro (HUMALOG) 100 UNIT/ML injection Inject 1-5 Units into the skin 3 (three) times daily before meals.   ipratropium-albuterol (DUONEB) 0.5-2.5 (3) MG/3ML SOLN Take 3 mLs by nebulization every 8 (eight) hours.   levothyroxine (SYNTHROID)  75 MCG tablet Take 1 tablet (75 mcg total) by mouth daily.   LORazepam (ATIVAN) 0.5 MG tablet Take 0.5-1 tablets (0.25-0.5 mg total) by mouth See admin instructions. Take 0.25 mg by mouth two times a day and 0.5 mg at bedtime   METAMUCIL FIBER PO Take 3.4 g by mouth daily.   metoprolol succinate (TOPROL-XL) 50 MG 24 hr tablet Take 1 tablet (50 mg total) by mouth every morning. Take with or immediately following a meal.   montelukast (SINGULAIR) 10 MG tablet Take 1 tablet (10 mg total) by mouth at bedtime. TAKE 1 TABLET(10 MG) BY MOUTH AT BEDTIME   paliperidone (INVEGA) 3 MG 24 hr tablet Take 3 mg by mouth at bedtime.   pantoprazole (PROTONIX) 40 MG tablet TAKE 1 TABLET(40 MG) BY MOUTH DAILY   polyethylene glycol (MIRALAX / GLYCOLAX) 17 g packet Take 17 g by mouth every other day.   polyvinyl alcohol (LIQUIFILM TEARS) 1.4 % ophthalmic solution Place 1 drop into both eyes 2 (two) times daily.   predniSONE (DELTASONE) 20 MG tablet Take 2 tablets (40 mg total) by mouth daily with breakfast for 2 days.   Probiotic Product (PROBIOTIC PO) Take 1 capsule by mouth 2 (two) times daily.   sucralfate (CARAFATE) 1 G tablet Take 1 g by mouth daily.   torsemide (DEMADEX) 20 MG tablet Take 40 mg by mouth in the morning.   vitamin B-12 (CYANOCOBALAMIN) 1000 MCG tablet Take 1,000 mcg by mouth daily.   vitamin C (ASCORBIC ACID) 500 MG tablet Take 500 mg by mouth daily.   [DISCONTINUED] insulin aspart (NOVOLOG) 100 UNIT/ML injection Inject 0-6 Units into the skin 3 (three) times daily with meals. 0-6 Units, Subcutaneous, 3 times daily with meals CBG < 70: Implement Hypoglycemia measures CBG 70 - 120: 0 units CBG 121 - 150: 0 units CBG 151 - 200: 1 unit CBG 201-250: 2 units CBG 251-300: 3 units CBG 301-350: 4 units CBG 351-400: 5 units CBG > 400: Give 6 units and call MD (Patient not taking: No sig reported)   No facility-administered encounter medications on file as of 12/08/2020.     Thank you for the  opportunity to participate in the care of Ms. Depierro.  The palliative care team will continue to follow. Please call our office at [phone] if we can be of additional assistance.   Note: Portions of this note were generated with [employer]. Dictation errors may occur despite best attempts at proofreading.  Teodoro Spray,  NP

## 2020-12-09 DIAGNOSIS — N189 Chronic kidney disease, unspecified: Secondary | ICD-10-CM | POA: Diagnosis not present

## 2020-12-09 DIAGNOSIS — N184 Chronic kidney disease, stage 4 (severe): Secondary | ICD-10-CM | POA: Diagnosis not present

## 2020-12-09 DIAGNOSIS — I5032 Chronic diastolic (congestive) heart failure: Secondary | ICD-10-CM | POA: Diagnosis not present

## 2020-12-09 DIAGNOSIS — I5031 Acute diastolic (congestive) heart failure: Secondary | ICD-10-CM | POA: Diagnosis not present

## 2020-12-09 DIAGNOSIS — Z794 Long term (current) use of insulin: Secondary | ICD-10-CM | POA: Diagnosis not present

## 2020-12-09 DIAGNOSIS — K219 Gastro-esophageal reflux disease without esophagitis: Secondary | ICD-10-CM | POA: Diagnosis not present

## 2020-12-09 DIAGNOSIS — J45998 Other asthma: Secondary | ICD-10-CM | POA: Diagnosis not present

## 2020-12-09 DIAGNOSIS — E118 Type 2 diabetes mellitus with unspecified complications: Secondary | ICD-10-CM | POA: Diagnosis not present

## 2020-12-09 DIAGNOSIS — I272 Pulmonary hypertension, unspecified: Secondary | ICD-10-CM | POA: Diagnosis not present

## 2020-12-09 DIAGNOSIS — Z789 Other specified health status: Secondary | ICD-10-CM | POA: Diagnosis not present

## 2020-12-16 DIAGNOSIS — D6489 Other specified anemias: Secondary | ICD-10-CM | POA: Diagnosis not present

## 2020-12-16 DIAGNOSIS — N184 Chronic kidney disease, stage 4 (severe): Secondary | ICD-10-CM | POA: Diagnosis not present

## 2020-12-16 DIAGNOSIS — R0989 Other specified symptoms and signs involving the circulatory and respiratory systems: Secondary | ICD-10-CM | POA: Diagnosis not present

## 2020-12-16 DIAGNOSIS — Z9189 Other specified personal risk factors, not elsewhere classified: Secondary | ICD-10-CM | POA: Diagnosis not present

## 2020-12-19 DIAGNOSIS — I10 Essential (primary) hypertension: Secondary | ICD-10-CM | POA: Diagnosis not present

## 2020-12-19 DIAGNOSIS — E785 Hyperlipidemia, unspecified: Secondary | ICD-10-CM | POA: Diagnosis not present

## 2020-12-19 DIAGNOSIS — N184 Chronic kidney disease, stage 4 (severe): Secondary | ICD-10-CM | POA: Diagnosis not present

## 2020-12-19 DIAGNOSIS — E441 Mild protein-calorie malnutrition: Secondary | ICD-10-CM | POA: Diagnosis not present

## 2020-12-19 DIAGNOSIS — I5032 Chronic diastolic (congestive) heart failure: Secondary | ICD-10-CM | POA: Diagnosis not present

## 2020-12-19 DIAGNOSIS — R5381 Other malaise: Secondary | ICD-10-CM | POA: Diagnosis not present

## 2020-12-19 DIAGNOSIS — R269 Unspecified abnormalities of gait and mobility: Secondary | ICD-10-CM | POA: Diagnosis not present

## 2020-12-19 DIAGNOSIS — R0602 Shortness of breath: Secondary | ICD-10-CM | POA: Diagnosis not present

## 2020-12-19 DIAGNOSIS — J9621 Acute and chronic respiratory failure with hypoxia: Secondary | ICD-10-CM | POA: Diagnosis not present

## 2020-12-19 DIAGNOSIS — Z96659 Presence of unspecified artificial knee joint: Secondary | ICD-10-CM | POA: Diagnosis not present

## 2020-12-19 DIAGNOSIS — E1140 Type 2 diabetes mellitus with diabetic neuropathy, unspecified: Secondary | ICD-10-CM | POA: Diagnosis not present

## 2020-12-19 DIAGNOSIS — I7091 Generalized atherosclerosis: Secondary | ICD-10-CM | POA: Diagnosis not present

## 2020-12-19 DIAGNOSIS — R739 Hyperglycemia, unspecified: Secondary | ICD-10-CM | POA: Diagnosis not present

## 2020-12-19 DIAGNOSIS — F32A Depression, unspecified: Secondary | ICD-10-CM | POA: Diagnosis not present

## 2020-12-19 DIAGNOSIS — J9611 Chronic respiratory failure with hypoxia: Secondary | ICD-10-CM | POA: Diagnosis not present

## 2020-12-19 DIAGNOSIS — D631 Anemia in chronic kidney disease: Secondary | ICD-10-CM | POA: Diagnosis not present

## 2020-12-19 DIAGNOSIS — M6281 Muscle weakness (generalized): Secondary | ICD-10-CM | POA: Diagnosis not present

## 2020-12-19 DIAGNOSIS — D649 Anemia, unspecified: Secondary | ICD-10-CM | POA: Diagnosis not present

## 2020-12-19 DIAGNOSIS — E039 Hypothyroidism, unspecified: Secondary | ICD-10-CM | POA: Diagnosis not present

## 2020-12-21 DIAGNOSIS — J9611 Chronic respiratory failure with hypoxia: Secondary | ICD-10-CM | POA: Diagnosis not present

## 2020-12-21 DIAGNOSIS — M6281 Muscle weakness (generalized): Secondary | ICD-10-CM | POA: Diagnosis not present

## 2020-12-22 DIAGNOSIS — M6281 Muscle weakness (generalized): Secondary | ICD-10-CM | POA: Diagnosis not present

## 2020-12-22 DIAGNOSIS — J9611 Chronic respiratory failure with hypoxia: Secondary | ICD-10-CM | POA: Diagnosis not present

## 2020-12-23 DIAGNOSIS — M6281 Muscle weakness (generalized): Secondary | ICD-10-CM | POA: Diagnosis not present

## 2020-12-23 DIAGNOSIS — J9611 Chronic respiratory failure with hypoxia: Secondary | ICD-10-CM | POA: Diagnosis not present

## 2020-12-24 DIAGNOSIS — M6281 Muscle weakness (generalized): Secondary | ICD-10-CM | POA: Diagnosis not present

## 2020-12-24 DIAGNOSIS — J9611 Chronic respiratory failure with hypoxia: Secondary | ICD-10-CM | POA: Diagnosis not present

## 2020-12-25 DIAGNOSIS — R5381 Other malaise: Secondary | ICD-10-CM | POA: Diagnosis not present

## 2020-12-25 DIAGNOSIS — D631 Anemia in chronic kidney disease: Secondary | ICD-10-CM | POA: Diagnosis not present

## 2020-12-25 DIAGNOSIS — E039 Hypothyroidism, unspecified: Secondary | ICD-10-CM | POA: Diagnosis not present

## 2020-12-25 DIAGNOSIS — R0602 Shortness of breath: Secondary | ICD-10-CM | POA: Diagnosis not present

## 2020-12-25 DIAGNOSIS — N184 Chronic kidney disease, stage 4 (severe): Secondary | ICD-10-CM | POA: Diagnosis not present

## 2020-12-25 DIAGNOSIS — J9621 Acute and chronic respiratory failure with hypoxia: Secondary | ICD-10-CM | POA: Diagnosis not present

## 2020-12-25 DIAGNOSIS — E1140 Type 2 diabetes mellitus with diabetic neuropathy, unspecified: Secondary | ICD-10-CM | POA: Diagnosis not present

## 2020-12-25 DIAGNOSIS — I5032 Chronic diastolic (congestive) heart failure: Secondary | ICD-10-CM | POA: Diagnosis not present

## 2020-12-28 ENCOUNTER — Telehealth: Payer: Self-pay

## 2020-12-28 NOTE — Telephone Encounter (Signed)
Phone call placed to PCP office. Provided update on patient and requested order for Palliative care to continue to follow at home.

## 2020-12-28 NOTE — Telephone Encounter (Signed)
(  3:15 pm) Per request of the Palliative Care Nurse Navigator-B. Senor, SW completed a follow-up call to patient's PCG/sister-[place] to provide support. [name] explained that patient has has been sleeping, only taking bites and sips since she was discharged home from [facility] on Friday. She report that patient takes over 50 pills, obese where it is difficult to do her personal care and she has had intermittent hallucinations where she is talking to entities not seen by others or seeing objects. [name] notes that patient does not seem to be coming around and she is concern that patient may need hospice. She has an agency coming tomorrow, which she is hoping can help with patient's personal care. SW provided active and empathetic listening, supportive presence/counseling and encouraged [name] to call back if patient continues to sleep and talk with the nurse navigator to determine if a hospice consult is needed.  [name] thanked SW for the call and agreed to call back.   SW consulted with the nurse navigator  following call with patient's sister, who agreed that patient maybe approaching end of life and will contact patient's PCP for hospice order.

## 2020-12-28 NOTE — Telephone Encounter (Signed)
Received message to return call to patient's sister, [name]. Spoke with [name] who shared patient was D/C from [facility] on Friday evening. She was drowsy and difficult to arouse. [name] stated she is unable to provide care for patient at this level. Patient was ambulatory when she was the facility. Now, she is in the bed. PO intake has declined. Patient has slight cough, appears to be having hallucinations. [name] stated patient does fell hot to touch. Continues on oxygen at 2 liters, no evidence of respiratory distress.    Plan: to update PCP and Palliative care providers.

## 2021-01-28 DEATH — deceased

## 2021-05-10 ENCOUNTER — Ambulatory Visit: Payer: Medicare Other | Admitting: Interventional Cardiology

## 2023-01-07 IMAGING — CT CT CHEST W/O CM
2 of 4 series · 15 of 36 positions shown, 18 images · non-contrast
Comparison: Radiograph earlier today.

CLINICAL DATA: Respiratory illness, nondiagnostic xray

Shortness of breath.
EXAM:
CT CHEST WITHOUT CONTRAST
TECHNIQUE: Multidetector CT imaging of the chest was performed following the
standard protocol without IV contrast.

[Series 3: chest w/o 2mm st · axial · non-contrast · 0.72mm/px · z∈[-456,-234]mm · 12 of 131 slices shown, 15 images]
[im 10/131  mediastinal]
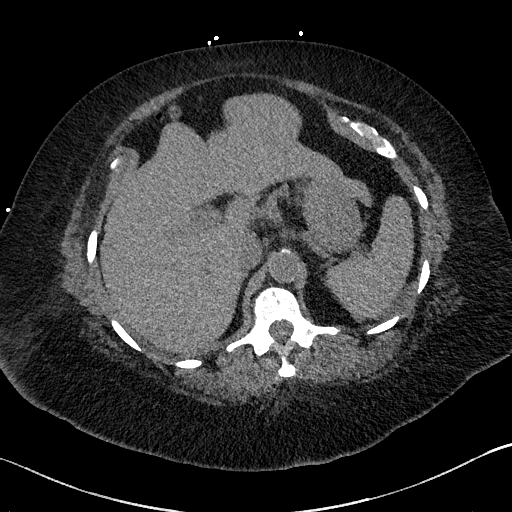
[im 10/131  lung]
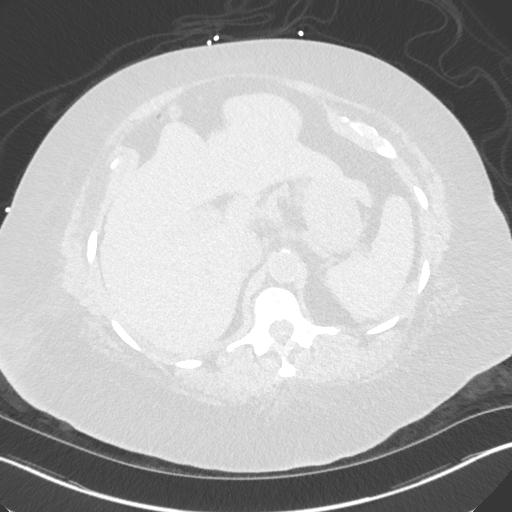
[im 19/131  lung]
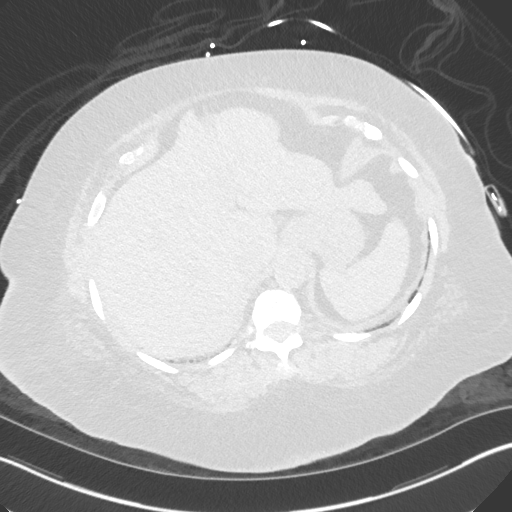
[im 28/131  lung]
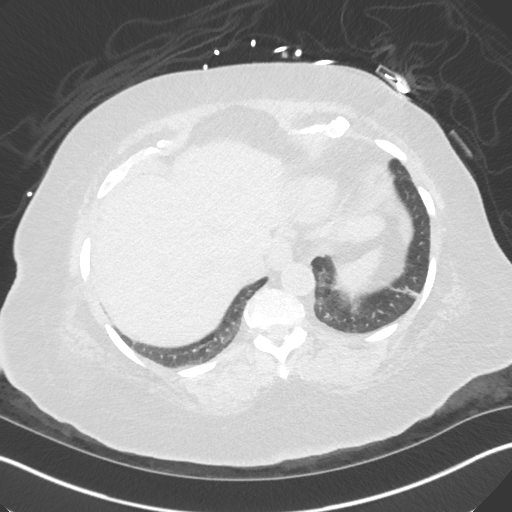
[im 38/131  lung]
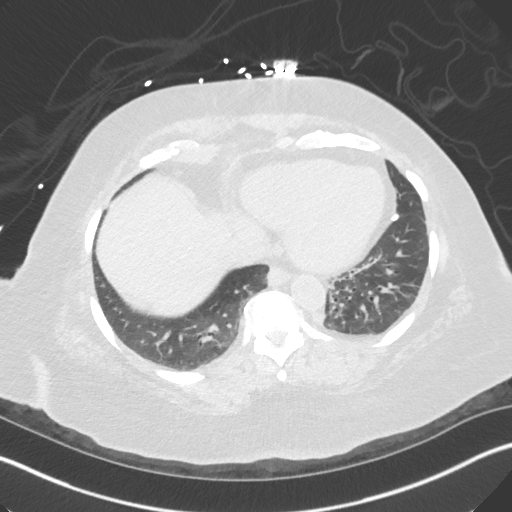
[im 47/131  mediastinal]
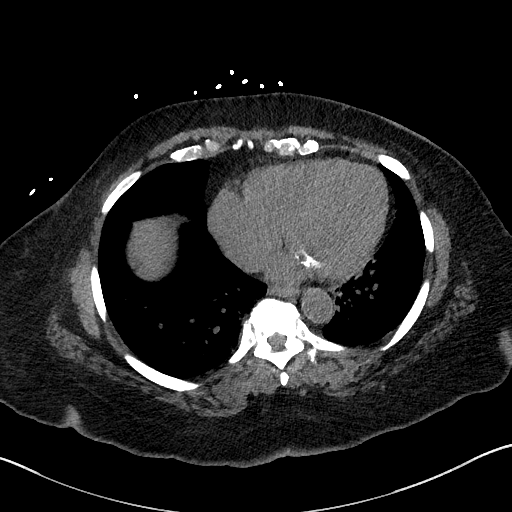
[im 47/131  lung]
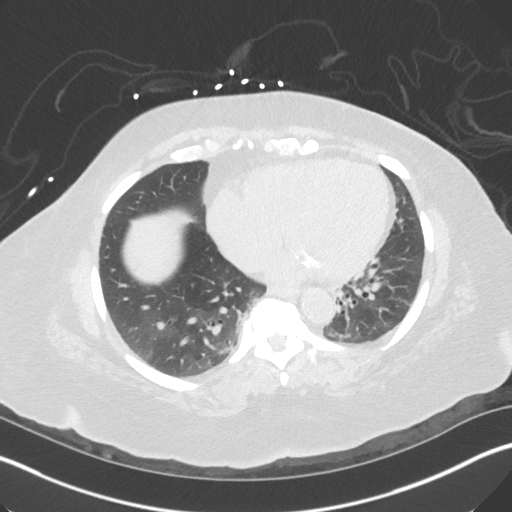
[im 56/131  lung]
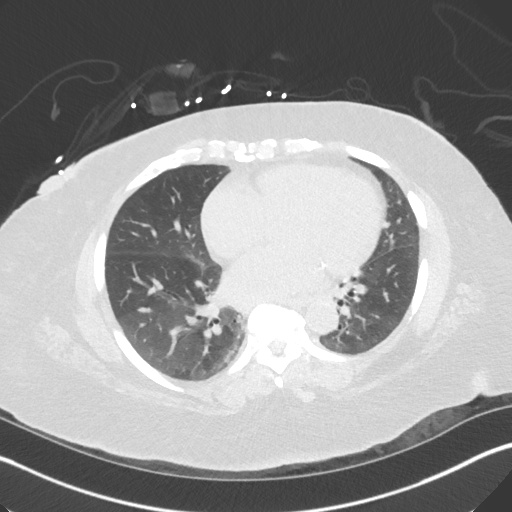
[im 75/131  lung]
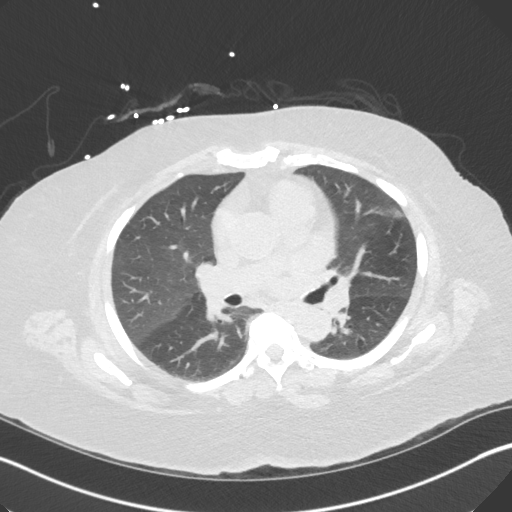
[im 84/131  lung]
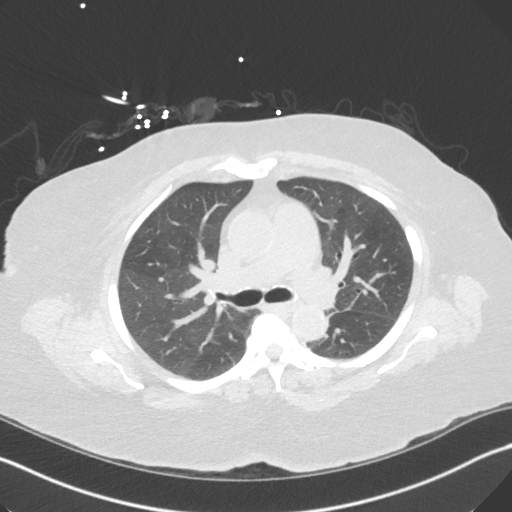
[im 93/131  mediastinal]
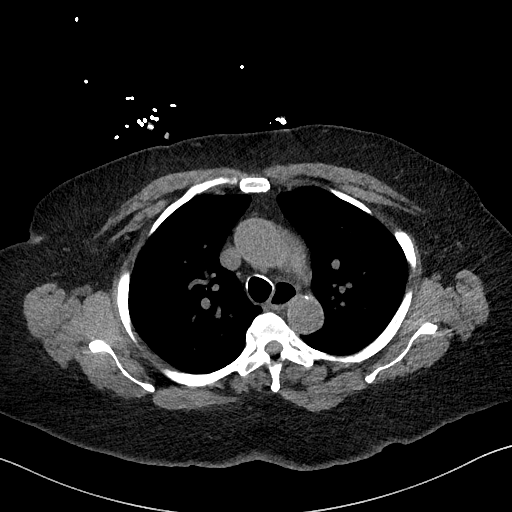
[im 93/131  lung]
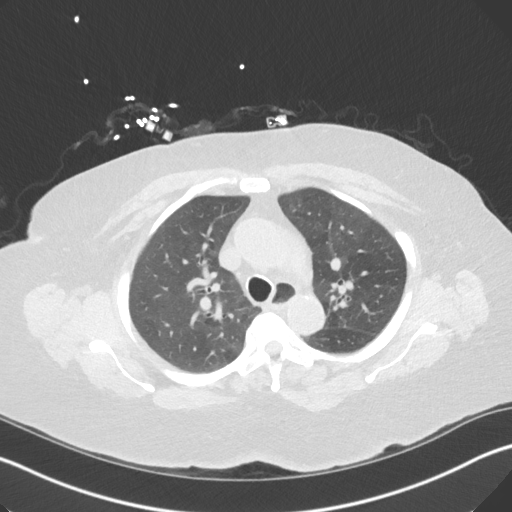
[im 103/131  lung]
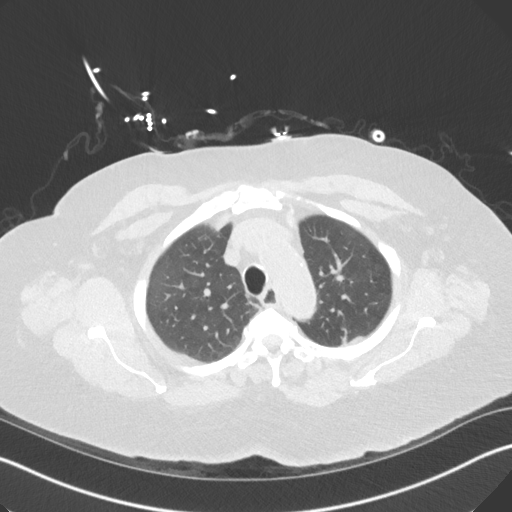
[im 112/131  lung]
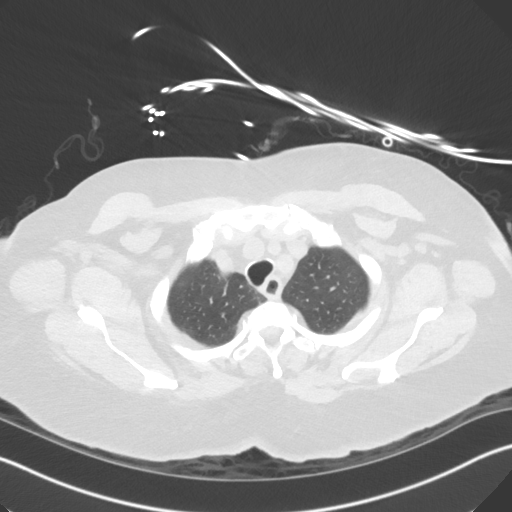
[im 121/131  lung]
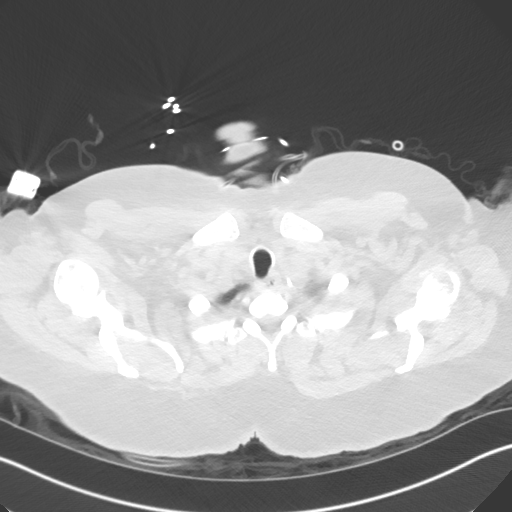

[Series 5: chest w/o 2mm st cor · coronal · non-contrast · 0.59mm/px · 3 of 170 slices shown]
[im 34/170  lung]
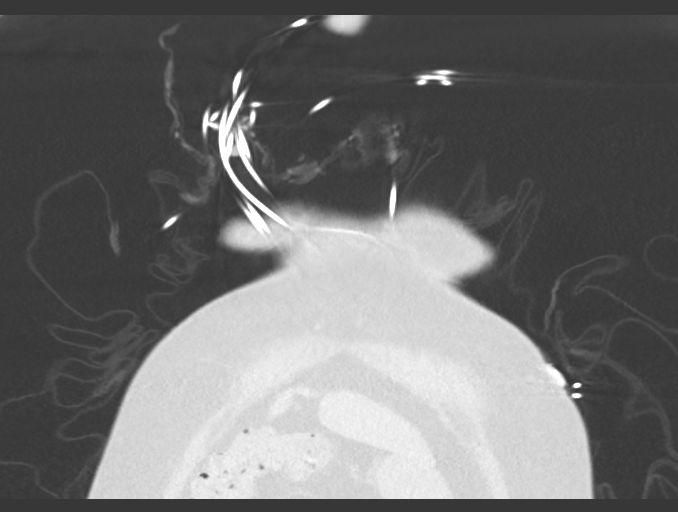
[im 68/170  lung]
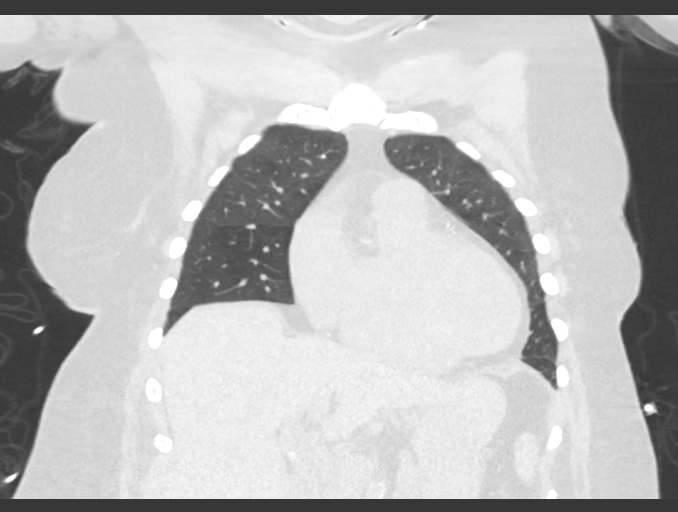
[im 102/170  lung]
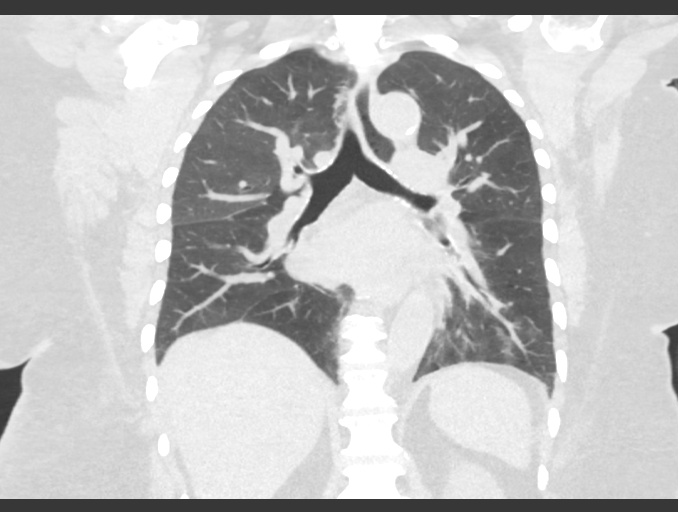

[15 of 36 positions shown; findings below may reference images not displayed]

FINDINGS: Cardiovascular: Aortic atherosclerosis. No aortic aneurysm. Coronary
artery calcifications versus stents. Mitral annulus calcifications.
Mild cardiomegaly. No pericardial effusion.

Mediastinum/Nodes: No enlarged mediastinal lymph nodes. No obvious
hilar adenopathy on this noncontrast exam. No axillary adenopathy.
No esophageal wall thickening. Right thyroidectomy.

Lungs/Pleura: Subsegmental linear atelectasis in the lingula and
left lower lobe. Mild heterogeneous pulmonary parenchyma. No
confluent consolidation. No pleural effusion. The trachea and
central bronchi are patent. No septal thickening.

Upper Abdomen: No acute upper abdominal findings.

Musculoskeletal: Thoracic spondylosis with endplate spurring. There
are no acute or suspicious osseous abnormalities.
IMPRESSION: 1. Mild heterogeneous pulmonary parenchyma, can be seen with small
vessel or small airways disease.
2. Subsegmental linear atelectasis in the lingula and left lower
lobe.
3. Mild cardiomegaly. Coronary artery calcifications versus stents.

Aortic Atherosclerosis ([id number]).

## 2023-07-04 NOTE — Progress Notes (Signed)
 This encounter was created in error - please disregard.
